# Patient Record
Sex: Male | Born: 1945
Health system: Southern US, Community
[De-identification: ages and names within clinical notes are randomized; demographics above are authoritative.]

## PROBLEM LIST (undated history)

## (undated) DIAGNOSIS — K219 Gastro-esophageal reflux disease without esophagitis: Secondary | ICD-10-CM

## (undated) DIAGNOSIS — E559 Vitamin D deficiency, unspecified: Secondary | ICD-10-CM

## (undated) DIAGNOSIS — I509 Heart failure, unspecified: Secondary | ICD-10-CM

## (undated) DIAGNOSIS — Z950 Presence of cardiac pacemaker: Secondary | ICD-10-CM

## (undated) DIAGNOSIS — E291 Testicular hypofunction: Secondary | ICD-10-CM

## (undated) DIAGNOSIS — N183 Chronic kidney disease, stage 3 unspecified: Secondary | ICD-10-CM

## (undated) DIAGNOSIS — I82409 Acute embolism and thrombosis of unspecified deep veins of unspecified lower extremity: Secondary | ICD-10-CM

## (undated) DIAGNOSIS — M1990 Unspecified osteoarthritis, unspecified site: Secondary | ICD-10-CM

## (undated) DIAGNOSIS — G4730 Sleep apnea, unspecified: Secondary | ICD-10-CM

## (undated) DIAGNOSIS — O2230 Deep phlebothrombosis in pregnancy, unspecified trimester: Secondary | ICD-10-CM

## (undated) DIAGNOSIS — I472 Ventricular tachycardia, unspecified: Secondary | ICD-10-CM

## (undated) DIAGNOSIS — I10 Essential (primary) hypertension: Secondary | ICD-10-CM

## (undated) DIAGNOSIS — T82110A Breakdown (mechanical) of cardiac electrode, initial encounter: Secondary | ICD-10-CM

## (undated) DIAGNOSIS — N529 Male erectile dysfunction, unspecified: Secondary | ICD-10-CM

## (undated) DIAGNOSIS — Z973 Presence of spectacles and contact lenses: Secondary | ICD-10-CM

## (undated) DIAGNOSIS — I428 Other cardiomyopathies: Secondary | ICD-10-CM

## (undated) DIAGNOSIS — I2510 Atherosclerotic heart disease of native coronary artery without angina pectoris: Secondary | ICD-10-CM

## (undated) DIAGNOSIS — I420 Dilated cardiomyopathy: Secondary | ICD-10-CM

## (undated) DIAGNOSIS — I499 Cardiac arrhythmia, unspecified: Secondary | ICD-10-CM

## (undated) DIAGNOSIS — I5022 Chronic systolic (congestive) heart failure: Secondary | ICD-10-CM

## (undated) DIAGNOSIS — I493 Ventricular premature depolarization: Secondary | ICD-10-CM

## (undated) DIAGNOSIS — Z974 Presence of external hearing-aid: Secondary | ICD-10-CM

## (undated) DIAGNOSIS — E119 Type 2 diabetes mellitus without complications: Secondary | ICD-10-CM

## (undated) DIAGNOSIS — H269 Unspecified cataract: Secondary | ICD-10-CM

## (undated) DIAGNOSIS — Z95810 Presence of automatic (implantable) cardiac defibrillator: Secondary | ICD-10-CM

## (undated) DIAGNOSIS — Z4502 Encounter for adjustment and management of automatic implantable cardiac defibrillator: Secondary | ICD-10-CM

## (undated) HISTORY — DX: Essential (primary) hypertension: I10

## (undated) HISTORY — DX: Ventricular tachycardia: I47.2

## (undated) HISTORY — DX: Male erectile dysfunction, unspecified: N52.9

## (undated) HISTORY — DX: Breakdown (mechanical) of cardiac electrode, initial encounter: T82.110A

## (undated) HISTORY — DX: Vitamin D deficiency, unspecified: E55.9

## (undated) HISTORY — DX: Testicular hypofunction: E29.1

## (undated) HISTORY — DX: Unspecified osteoarthritis, unspecified site: M19.90

## (undated) HISTORY — PX: VASECTOMY: SHX75

## (undated) HISTORY — DX: Dilated cardiomyopathy: I42.0

## (undated) HISTORY — DX: Unspecified cataract: H26.9

## (undated) HISTORY — PX: LUMBAR LAMINECTOMY: SHX95

## (undated) HISTORY — PX: CATARACT EXTRACTION W/ INTRAOCULAR LENS  IMPLANT, BILATERAL: SHX1307

## (undated) HISTORY — DX: Ventricular tachycardia, unspecified: I47.20

---

## 1967-06-18 HISTORY — PX: APPENDECTOMY: SHX54

## 1970-06-17 HISTORY — PX: TONSILLECTOMY AND ADENOIDECTOMY: SUR1326

## 1982-06-17 HISTORY — PX: KNEE ARTHROSCOPY: SUR90

## 2010-04-17 HISTORY — PX: CARDIAC CATHETERIZATION: SHX172

## 2010-05-09 HISTORY — PX: CARDIAC DEFIBRILLATOR PLACEMENT: SHX171

## 2011-09-14 ENCOUNTER — Ambulatory Visit: Payer: Self-pay | Admitting: Family Medicine

## 2011-09-14 ENCOUNTER — Inpatient Hospital Stay: Payer: Self-pay | Admitting: *Deleted

## 2011-09-14 LAB — CBC WITH DIFFERENTIAL/PLATELET
Basophil #: 0 10*3/uL (ref 0.0–0.1)
Basophil %: 0.1 %
Comment - H1-Com1: NORMAL
Comment - H1-Com2: NORMAL
Eosinophil #: 2.3 10*3/uL — ABNORMAL HIGH (ref 0.0–0.7)
Eosinophil %: 22.1 %
Eosinophil: 24 %
HCT: 52.5 % — ABNORMAL HIGH (ref 40.0–52.0)
HGB: 17.7 g/dL (ref 13.0–18.0)
Lymphocyte %: 17.7 %
MCH: 31.3 pg (ref 26.0–34.0)
Monocyte #: 0.1 10*3/uL (ref 0.0–0.7)
Monocytes: 2 %
Neutrophil #: 6.2 10*3/uL (ref 1.4–6.5)
Neutrophil %: 59.6 %
RDW: 14.5 % (ref 11.5–14.5)
Segmented Neutrophils: 46 %
WBC: 10.4 10*3/uL (ref 3.8–10.6)

## 2011-09-14 LAB — COMPREHENSIVE METABOLIC PANEL
Alkaline Phosphatase: 99 U/L (ref 50–136)
BUN: 20 mg/dL — ABNORMAL HIGH (ref 7–18)
Calcium, Total: 6.8 mg/dL — CL (ref 8.5–10.1)
Creatinine: 1.32 mg/dL — ABNORMAL HIGH (ref 0.60–1.30)
EGFR (African American): >60
Glucose: 151 mg/dL — ABNORMAL HIGH (ref 65–99)
Osmolality: 289 (ref 275–301)
Sodium: 142 mmol/L (ref 136–145)
Total Protein: 5.7 g/dL — ABNORMAL LOW (ref 6.4–8.2)

## 2011-09-15 LAB — COMPREHENSIVE METABOLIC PANEL
Anion Gap: 11 (ref 7–16)
BUN: 25 mg/dL — ABNORMAL HIGH (ref 7–18)
Bilirubin,Total: 0.4 mg/dL (ref 0.2–1.0)
Chloride: 107 mmol/L (ref 98–107)
Creatinine: 1.32 mg/dL — ABNORMAL HIGH (ref 0.60–1.30)
EGFR (African American): >60
Glucose: 231 mg/dL — ABNORMAL HIGH (ref 65–99)
Osmolality: 295 (ref 275–301)
Potassium: 3.2 mmol/L — ABNORMAL LOW (ref 3.5–5.1)
SGPT (ALT): 15 U/L
Total Protein: 5.2 g/dL — ABNORMAL LOW (ref 6.4–8.2)

## 2011-09-15 LAB — CBC WITH DIFFERENTIAL/PLATELET
Basophil #: 0 10*3/uL (ref 0.0–0.1)
Basophil %: 0.3 %
Eosinophil #: 0.4 10*3/uL (ref 0.0–0.7)
Eosinophil %: 3 %
Lymphocyte #: 1 10*3/uL (ref 1.0–3.6)
MCH: 31 pg (ref 26.0–34.0)
MCHC: 32.9 g/dL (ref 32.0–36.0)
MCV: 94 fL (ref 80–100)
Monocyte #: 0.2 10*3/uL (ref 0.0–0.7)
Monocyte %: 1.6 %
Neutrophil #: 11.2 10*3/uL — ABNORMAL HIGH (ref 1.4–6.5)
RBC: 4.55 10*6/uL (ref 4.40–5.90)
RDW: 14.5 % (ref 11.5–14.5)

## 2011-09-15 LAB — MAGNESIUM: Magnesium: 1 mg/dL — ABNORMAL LOW

## 2012-06-17 DIAGNOSIS — K760 Fatty (change of) liver, not elsewhere classified: Secondary | ICD-10-CM

## 2012-06-17 HISTORY — DX: Fatty (change of) liver, not elsewhere classified: K76.0

## 2012-08-26 ENCOUNTER — Ambulatory Visit: Payer: 59

## 2012-08-26 ENCOUNTER — Other Ambulatory Visit: Payer: Self-pay | Admitting: Radiology

## 2012-08-26 DIAGNOSIS — E291 Testicular hypofunction: Secondary | ICD-10-CM

## 2012-08-26 DIAGNOSIS — Z0000 Encounter for general adult medical examination without abnormal findings: Secondary | ICD-10-CM

## 2012-08-26 MED ORDER — ROSUVASTATIN CALCIUM 20 MG PO TABS: 20.0000 mg | ORAL_TABLET | Freq: Every day | ORAL | Status: DC

## 2012-08-26 MED ORDER — TAMSULOSIN HCL 0.4 MG PO CAPS: 0.4000 mg | ORAL_CAPSULE | Freq: Every day | ORAL | Status: DC

## 2012-08-26 MED ORDER — METFORMIN HCL ER (MOD) 500 MG PO TB24: 500.0000 mg | ORAL_TABLET | Freq: Four times a day (QID) | ORAL | Status: DC

## 2012-08-26 MED ORDER — LIRAGLUTIDE 18 MG/3ML ~~LOC~~ SOLN: 1.8000 mg | Freq: Every day | SUBCUTANEOUS | Status: DC

## 2012-08-26 MED ORDER — EPLERENONE 50 MG PO TABS: 50.0000 mg | ORAL_TABLET | Freq: Every day | ORAL | Status: DC

## 2012-08-26 MED ORDER — HYDROCHLOROTHIAZIDE 12.5 MG PO CAPS: 12.5000 mg | ORAL_CAPSULE | Freq: Every day | ORAL | Status: DC

## 2012-08-26 MED ORDER — CELECOXIB 200 MG PO CAPS: 200.0000 mg | ORAL_CAPSULE | Freq: Two times a day (BID) | ORAL | Status: DC

## 2012-08-26 MED ORDER — GLIMEPIRIDE 4 MG PO TABS: 8.0000 mg | ORAL_TABLET | Freq: Every day | ORAL | Status: DC

## 2012-08-26 MED ORDER — TESTOSTERONE 50 MG/5GM (1%) TD GEL: TRANSDERMAL | Status: DC

## 2012-08-26 MED ORDER — METOPROLOL SUCCINATE ER 200 MG PO TB24: 200.0000 mg | ORAL_TABLET | Freq: Every day | ORAL | Status: DC

## 2012-08-26 NOTE — Telephone Encounter (Signed)
, °

## 2012-08-27 ENCOUNTER — Other Ambulatory Visit: Payer: Self-pay | Admitting: Radiology

## 2012-08-27 NOTE — Telephone Encounter (Signed)
Called pharmacy to clarify Androgel Rx 1.62% four pumps daily

## 2012-10-01 ENCOUNTER — Encounter: Payer: Self-pay | Admitting: Radiology

## 2012-10-06 ENCOUNTER — Encounter: Payer: 59 | Admitting: Emergency Medicine

## 2012-10-16 ENCOUNTER — Other Ambulatory Visit (INDEPENDENT_AMBULATORY_CARE_PROVIDER_SITE_OTHER): Payer: 59 | Admitting: *Deleted

## 2012-10-16 DIAGNOSIS — E119 Type 2 diabetes mellitus without complications: Secondary | ICD-10-CM

## 2012-10-16 DIAGNOSIS — Z0000 Encounter for general adult medical examination without abnormal findings: Secondary | ICD-10-CM

## 2012-10-16 DIAGNOSIS — E291 Testicular hypofunction: Secondary | ICD-10-CM

## 2012-10-16 LAB — LIPID PANEL
Cholesterol: 175 mg/dL (ref 0–200)
HDL: 45 mg/dL (ref >39)
Total CHOL/HDL Ratio: 3.9 Ratio
Triglycerides: 159 mg/dL — ABNORMAL HIGH (ref <150)
VLDL: 32 mg/dL (ref 0–40)

## 2012-10-16 LAB — COMPREHENSIVE METABOLIC PANEL
BUN: 26 mg/dL — ABNORMAL HIGH (ref 6–23)
CO2: 29 mEq/L (ref 19–32)
Creat: 1.13 mg/dL (ref 0.50–1.35)
Glucose, Bld: 141 mg/dL — ABNORMAL HIGH (ref 70–99)
Total Bilirubin: 0.4 mg/dL (ref 0.3–1.2)
Total Protein: 6.6 g/dL (ref 6.0–8.3)

## 2012-10-16 LAB — CBC
HCT: 41.8 % (ref 39.0–52.0)
Platelets: 200 10*3/uL (ref 150–400)
RDW: 14 % (ref 11.5–15.5)
WBC: 8.2 10*3/uL (ref 4.0–10.5)

## 2012-10-16 LAB — TSH: TSH: 2.561 u[IU]/mL (ref 0.350–4.500)

## 2012-10-20 ENCOUNTER — Ambulatory Visit: Payer: 59

## 2012-10-20 ENCOUNTER — Encounter: Payer: Self-pay | Admitting: Emergency Medicine

## 2012-10-20 ENCOUNTER — Ambulatory Visit (INDEPENDENT_AMBULATORY_CARE_PROVIDER_SITE_OTHER): Payer: 59 | Admitting: Emergency Medicine

## 2012-10-20 VITALS — BP 110/80 | HR 81 | Temp 99.5°F | Resp 16 | Ht 70.5 in | Wt 238.6 lb

## 2012-10-20 DIAGNOSIS — I428 Other cardiomyopathies: Secondary | ICD-10-CM

## 2012-10-20 DIAGNOSIS — I472 Ventricular tachycardia: Secondary | ICD-10-CM | POA: Insufficient documentation

## 2012-10-20 DIAGNOSIS — E1165 Type 2 diabetes mellitus with hyperglycemia: Secondary | ICD-10-CM | POA: Insufficient documentation

## 2012-10-20 DIAGNOSIS — Z0000 Encounter for general adult medical examination without abnormal findings: Secondary | ICD-10-CM

## 2012-10-20 DIAGNOSIS — E119 Type 2 diabetes mellitus without complications: Secondary | ICD-10-CM

## 2012-10-20 DIAGNOSIS — M25471 Effusion, right ankle: Secondary | ICD-10-CM

## 2012-10-20 DIAGNOSIS — E291 Testicular hypofunction: Secondary | ICD-10-CM

## 2012-10-20 DIAGNOSIS — I495 Sick sinus syndrome: Secondary | ICD-10-CM

## 2012-10-20 DIAGNOSIS — I420 Dilated cardiomyopathy: Secondary | ICD-10-CM

## 2012-10-20 HISTORY — DX: Dilated cardiomyopathy: I42.0

## 2012-10-20 LAB — IFOBT (OCCULT BLOOD): IFOBT: NEGATIVE

## 2012-10-20 LAB — POCT URINALYSIS DIPSTICK
Glucose, UA: 100
Leukocytes, UA: NEGATIVE
Nitrite, UA: NEGATIVE

## 2012-10-20 LAB — POCT UA - MICROSCOPIC ONLY
WBC, Ur, HPF, POC: NEGATIVE
Yeast, UA: NEGATIVE

## 2012-10-20 MED ORDER — FUROSEMIDE 40 MG PO TABS: 40.0000 mg | ORAL_TABLET | Freq: Every day | ORAL | Status: DC

## 2012-10-20 NOTE — Progress Notes (Signed)
@[email or web address] @  Patient ID: Paul [name] MRN: [id number], DOB: Feb 07, 1946 67 y.o. Date of Encounter: 10/20/2012, 1:16 PM  Primary Physician: No primary provider on file.  Chief Complaint: Physical (CPE)  HPI: 67 y.o. y/o male with history noted below here for CPE.  Doing well. No issues/complaints.  Review of Systems:  Consitutional: No fever, chills, fatigue, night sweats, lymphadenopathy, or weight changes. Eyes: No visual changes, eye redness, or discharge. ENT/Mouth: Ears: No otalgia, hearing loss, discharge.Positive for tinnitus.  Nose: No congestion, rhinorrhea, sinus pain, or epistaxis. Throat: No sore throat,  or teeth pain.  Cardiovascular: History of nonischemic dilated cardiomyopathy. Implanted defibrillator. No CP, SOB, but does have swelling of both legs.    Respiratory: No cough, hemoptysis, SOB, or wheezing. Gastrointestinal: No anorexia, dysphagia, reflux, pain, nausea, vomiting, hematemesis, diarrhea, constipation, BRBPR, or melena. Genitourinary: No Frequency, urgency, hematuria, incontinence, nocturia, decreased urinary stream, discharge, impotence. Complaints of difficulty with urination and Scrotal swelling. Patient has been getting up 2-3 times each night to urinate. Musculoskeletal: No decreased ROM, myalgias, stiffness, joint swelling, or weakness. Complaints of knee pain. Skin: No rash, erythema, lesion changes, pain, warmth, jaundice, or pruritis. Neurological: No headache, dizziness, syncope, seizures, tremors, memory loss, coordination problems, or paresthesias. Positive Diplopia.  Psychological: No anxiety, depression, hallucinations, SI/HI. Endocrine: No fatigue, polydipsia, polyphagia, polyuria. Patient has diabetes Type 2 Complaints of hair loss. Glucose readings at home: 120-130 All other systems were reviewed and are otherwise negative.  Past Medical History  Diagnosis Date  . Cataract   . Diabetes mellitus without complication   . Hypertension   .  Arthritis   . Hypovitaminosis D   . Hypogonadism male   . Ventricular tachycardia   . ICD (implantable cardiac defibrillator) in place      Past Surgical History  Procedure Laterality Date  . Appendectomy    . Eye surgery    . Spine surgery    . Vasectomy    . Tonsillectomy and adenoidectomy  1972  . Knee arthroscopy  1984    Home Meds:  Prior to Admission medications   Medication Sig Start Date End Date Taking? Authorizing Provider  celecoxib (CELEBREX) 200 MG capsule Take 1 capsule (200 mg total) by mouth 2 (two) times daily. 08/26/12   Collene Gobble, MD  eplerenone (INSPRA) 50 MG tablet Take 1 tablet (50 mg total) by mouth daily. 08/26/12   Collene Gobble, MD  glimepiride (AMARYL) 4 MG tablet Take 2 tablets (8 mg total) by mouth daily before breakfast. 08/26/12   Collene Gobble, MD  hydrochlorothiazide (MICROZIDE) 12.5 MG capsule Take 1 capsule (12.5 mg total) by mouth daily. 08/26/12   Collene Gobble, MD  Liraglutide (VICTOZA) 18 MG/3ML SOLN injection Inject 0.3 mLs (1.8 mg total) into the skin daily. 08/26/12   Collene Gobble, MD  metFORMIN (GLUMETZA) 500 MG (MOD) 24 hr tablet Take 1 tablet (500 mg total) by mouth 4 (four) times daily. 08/26/12   Collene Gobble, MD  metoprolol (TOPROL-XL) 200 MG 24 hr tablet Take 1 tablet (200 mg total) by mouth daily. 08/26/12   Collene Gobble, MD  rosuvastatin (CRESTOR) 20 MG tablet Take 1 tablet (20 mg total) by mouth daily. 08/26/12   Collene Gobble, MD  tamsulosin (FLOMAX) 0.4 MG CAPS Take 1 capsule (0.4 mg total) by mouth daily. 08/26/12   Collene Gobble, MD  testosterone (ANDROGEL) 50 MG/5GM GEL Use four pumps daily 08/26/12   Collene Gobble, MD  Allergies:  Allergies  Allergen Reactions  . Lisinopril     angioedema    History   Social History  . Marital Status: Married    Spouse Name: N/A    Number of Children: N/A  . Years of Education: N/A   Occupational History  . Not on file.   Social History Main Topics  . Smoking status: Never  Smoker   . Smokeless tobacco: Never Used  . Alcohol Use: 1.2 oz/week    1 Glasses of wine, 1 Cans of beer per week     Comment: rarely  . Drug Use: No  . Sexually Active: Yes    Birth Control/ Protection: Post-menopausal   Other Topics Concern  . Not on file   Social History Narrative  . No narrative on file    Family History  Problem Relation Age of Onset  . Alcohol abuse Mother   . COPD Mother   . Depression Mother   . Heart disease Mother   . Hypertension Mother   . Stroke Mother   . COPD Father   . Heart disease Father   . Hypertension Father   . Stroke Father   . Cancer Father   . Diabetes Sister   . Mental retardation Sister   . Learning disabilities Sister   . Hypertension Sister   . Alcohol abuse Brother   . Drug abuse Brother   . Heart disease Brother   . Hypertension Brother   . Coronary artery disease Brother     Physical Exam:  Blood pressure 110/80, pulse 81, temperature 99.5 F (37.5 C), temperature source Oral, resp. rate 16, height 5' 10.5" (1.791 m), weight 238 lb 9.6 oz (108.228 kg), SpO2 98.00%.  General: Well developed, well nourished, in no acute distress. HEENT: Normocephalic, atraumatic. Conjunctiva pink, sclera non-icteric. Pupils 2 mm constricting to 1 mm, round, regular, and equally reactive to light and accomodation. EOMI. Internal auditory canal clear. TMs with good cone of light and without pathology. Nasal mucosa pink. Nares are without discharge. No sinus tenderness. Oral mucosa pink. Dentition . Pharynx without exudate.   Neck: Supple. Trachea midline. No thyromegaly. Full ROM. No lymphadenopathy. Lungs: Clear to auscultation bilaterally without wheezes, rales, or rhonchi. Breathing is of normal effort and unlabored. Cardiovascular: S1 S2 normal. 1/6 Systolic murmur left sternal boarder, very irregular rhythm.      Abdomen: Soft, non-tender, non-distended with normoactive bowel sounds. No hepatosplenomegaly or masses. No  rebound/guarding. No CVA tenderness. Without hernias.  Rectal: No external hemorrhoids or fissures. Rectal vault without masses Genitourinary: Musculoskeletal: Full range of motion and 5/5 strength throughout. Without swelling, atrophy, tenderness, crepitus, or warmth. Extremities without clubbing, cyanosis, or edema. Calves supple. Skin: Warm and moist without erythema, ecchymosis, wounds, or rash. Neuro: A+Ox3. CN II-XII grossly intact. Moves all extremities spontaneously. Full sensation throughout. Normal gait. DTR 2+ throughout upper and lower extremities. Finger to nose intact. Psych:  Responds to questions appropriately with a normal affect.  Extremity exam revealed 2 + bilateral edema  Results for orders placed in visit on 10/20/12  GLUCOSE, POCT (MANUAL RESULT ENTRY)      Result Value Range   POC Glucose 217 (*) 70 - 99 mg/dl  IFOBT (OCCULT BLOOD)      Result Value Range   IFOBT Negative    POCT UA - MICROSCOPIC ONLY      Result Value Range   WBC, Ur, HPF, POC negative     RBC, urine, microscopic negative  Bacteria, U Microscopic negative     Mucus, UA positive     Epithelial cells, urine per micros 1-3     Crystals, Ur, HPF, POC negative     Casts, Ur, LPF, POC negative     Yeast, UA negative    POCT URINALYSIS DIPSTICK      Result Value Range   Color, UA dark yellow     Clarity, UA clear     Glucose, UA 100     Bilirubin, UA small     Ketones, UA 15     Spec Grav, UA >=1.030     Blood, UA negative     pH, UA 5.0     Protein, UA 100     Urobilinogen, UA 1.0     Nitrite, UA negative     Leukocytes, UA Negative     [facility] reading (PRIMARY) by  Dr. Cleta Alberts defibrillator in place no evidence of CHF.   Studies:    Assessment/Plan:  67 y.o. White male with ischemic cardiomyopathy and implanted defibrillator. EKG reviewed by Dr. Antoine Poche and no acute changes seen. Multiple ectopic beats present. They are going to make an appointment at the [facility].  Appt made for doppler studies both legs. Will treat edema with Lasix 40 mg. 1/2 to 1 tab a day. Support hose suggested.       -  Signed, Earl Lites, MD 10/20/2012 1:16 PM

## 2012-10-28 ENCOUNTER — Telehealth: Payer: Self-pay | Admitting: Radiology

## 2012-10-28 NOTE — Telephone Encounter (Signed)
Rescheduled appt for [facility]. This is scheduled now for June 13th at 9:45. To you FYI. I rescheduled because Dr Linwood Dibbles wife is having surgery.

## 2012-10-31 ENCOUNTER — Encounter: Payer: Self-pay | Admitting: Emergency Medicine

## 2012-11-05 ENCOUNTER — Institutional Professional Consult (permissible substitution): Payer: Self-pay | Admitting: Internal Medicine

## 2012-11-11 ENCOUNTER — Encounter: Payer: 59 | Admitting: Emergency Medicine

## 2012-11-24 ENCOUNTER — Other Ambulatory Visit: Payer: Self-pay | Admitting: Emergency Medicine

## 2012-11-24 DIAGNOSIS — M25471 Effusion, right ankle: Secondary | ICD-10-CM

## 2012-11-24 MED ORDER — CELECOXIB 200 MG PO CAPS: 200.0000 mg | ORAL_CAPSULE | Freq: Two times a day (BID) | ORAL | Status: DC

## 2012-11-24 MED ORDER — ROSUVASTATIN CALCIUM 20 MG PO TABS: 20.0000 mg | ORAL_TABLET | Freq: Every day | ORAL | Status: DC

## 2012-11-24 MED ORDER — FUROSEMIDE 40 MG PO TABS: 40.0000 mg | ORAL_TABLET | Freq: Every day | ORAL | Status: DC

## 2012-11-24 MED ORDER — METFORMIN HCL ER (MOD) 500 MG PO TB24: 500.0000 mg | ORAL_TABLET | Freq: Four times a day (QID) | ORAL | Status: DC

## 2012-11-24 MED ORDER — HYDROCHLOROTHIAZIDE 12.5 MG PO CAPS: 12.5000 mg | ORAL_CAPSULE | Freq: Every day | ORAL | Status: DC

## 2012-11-24 MED ORDER — GLIMEPIRIDE 4 MG PO TABS: 8.0000 mg | ORAL_TABLET | Freq: Every day | ORAL | Status: DC

## 2012-11-27 ENCOUNTER — Ambulatory Visit (INDEPENDENT_AMBULATORY_CARE_PROVIDER_SITE_OTHER): Payer: 59 | Admitting: Internal Medicine

## 2012-11-27 ENCOUNTER — Encounter: Payer: Self-pay | Admitting: Internal Medicine

## 2012-11-27 ENCOUNTER — Ambulatory Visit (INDEPENDENT_AMBULATORY_CARE_PROVIDER_SITE_OTHER): Payer: 59 | Admitting: *Deleted

## 2012-11-27 VITALS — BP 128/90 | HR 71 | Ht 71.0 in | Wt 242.4 lb

## 2012-11-27 DIAGNOSIS — I472 Ventricular tachycardia, unspecified: Secondary | ICD-10-CM

## 2012-11-27 DIAGNOSIS — I428 Other cardiomyopathies: Secondary | ICD-10-CM

## 2012-11-27 DIAGNOSIS — I4891 Unspecified atrial fibrillation: Secondary | ICD-10-CM | POA: Insufficient documentation

## 2012-11-27 DIAGNOSIS — I420 Dilated cardiomyopathy: Secondary | ICD-10-CM

## 2012-11-27 DIAGNOSIS — R42 Dizziness and giddiness: Secondary | ICD-10-CM | POA: Insufficient documentation

## 2012-11-27 LAB — ICD DEVICE OBSERVATION
AL AMPLITUDE: 2.875 mv
AL IMPEDENCE ICD: 513 Ohm
ATRIAL PACING ICD: 37.88 pct
RV LEAD IMPEDENCE ICD: 361 Ohm
TOT-0001: 1
TOT-0002: 0
TOT-0006: 20111123000000
TZAT-0001ATACH: 2
TZAT-0001FASTVT: 1
TZAT-0002ATACH: NEGATIVE
TZAT-0002ATACH: NEGATIVE
TZAT-0002ATACH: NEGATIVE
TZAT-0002FASTVT: NEGATIVE
TZAT-0002SLOWVT: NEGATIVE
TZAT-0012ATACH: 150 ms
TZAT-0012FASTVT: 200 ms
TZAT-0018ATACH: NEGATIVE
TZAT-0018ATACH: NEGATIVE
TZAT-0019ATACH: 6 V
TZAT-0019ATACH: 6 V
TZAT-0019ATACH: 6 V
TZAT-0020ATACH: 1.5 ms
TZAT-0020SLOWVT: 1.5 ms
TZON-0004VSLOWVT: 44
TZON-0005SLOWVT: 12
TZON-0010FASTVT: 50 ms
TZON-0010SLOWVT: 50 ms
TZST-0001FASTVT: 4
TZST-0001FASTVT: 5
TZST-0001SLOWVT: 3
TZST-0001SLOWVT: 5
TZST-0002ATACH: NEGATIVE
TZST-0002FASTVT: NEGATIVE
TZST-0002FASTVT: NEGATIVE
TZST-0002SLOWVT: NEGATIVE
TZST-0002SLOWVT: NEGATIVE
TZST-0002SLOWVT: NEGATIVE
VF: 0

## 2012-11-27 NOTE — Assessment & Plan Note (Signed)
Detected on his device. Duration less than 10 minutes. As multiple thromboembolic risk factors and we had a brief discussion about the possible role of anticoagulation versus aspirin.  For now we will decrease his aspirin from 325--81

## 2012-11-27 NOTE — Patient Instructions (Addendum)
Your physician recommends that you schedule a follow-up appointment in:3 MONTHS WITH  DEVICE CLINIC  EITHER IN [place] OR   [facility] Your physician has requested that you have an echocardiogram. Echocardiography is a painless test that uses sound waves to create images of your heart. It provides your doctor with information about the size and shape of your heart and how well your heart's chambers and valves are working. This procedure takes approximately one hour. There are no restrictions for this procedure.  Your physician has recommended you make the following change in your medication:  DECREASE ASA TO 81 MG EVERY DAY DECREASE METOPROLOL TO 100 MG EVERY DAY  AND  TAKE  LASIX 40 MG  ALT WITH  80 MG EVERY OTHER DAY FOR 2 WEEKS

## 2012-11-27 NOTE — Assessment & Plan Note (Signed)
Patient has infrequent ventricular tachycardia but does have frequent PVCs. We will assess left ventricular function as noted above and continue him on his metoprolol

## 2012-11-27 NOTE — Progress Notes (Signed)
ELECTROPHYSIOLOGY CONSULT NOTE  Patient ID: Paul [name], MRN: [id number], DOB/AGE: [birth year] 67 y.o. Admit date: (Not on file) Date of Consult: 11/27/2012  Primary Physician: Lucilla Edin, MD Primary Cardiologist:new     Chief Complaint:  Establish    HPI Paul [name] is a 67 y.o. 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  At that time he been relatively healthy but [place] He was admitted and evaluated with a stress echo which was reported to show an ejection fraction of 45% w normalization at stress he also developed a wide-complex tachycardia thought to be ventricular in origin. He underwent catheterization 11/11 demonstrating nonobstructive disease  Rather diffusely.. Ejection fraction was recorded at 25-30%with normal filling pressures. He then underwent EP testing demonstrating sustained ventricular arrhythmias and an ICD was implanted. He's had no ICD discharges.  His functional capacity is relatively stable. He is not fit. He denies nocturnal dyspnea or orthopnea. He's had no chest pain.  He is recently suffered from lightheadedness-orthostatic with blood pressures recorded in the 90s. This prompted his PCP to rearrange his medications a little bit and that has helped some.  He enjoys scuba diving but has not done that.     Past Medical History  Diagnosis Date  . Cataract   . Diabetes mellitus without complication   . Hypertension   . Arthritis   . Hypovitaminosis D   . Hypogonadism male   . Ventricular tachycardia   . ICD (implantable cardiac defibrillator) in place       Surgical History:  Past Surgical History  Procedure Laterality Date  . Appendectomy    . Eye surgery    . Spine surgery    . Vasectomy    . Tonsillectomy and adenoidectomy  1972  . Knee arthroscopy  1984     Home Meds: Prior to Admission medications   Medication Sig Start Date End Date Taking?  Authorizing Provider  aspirin 325 MG tablet Take 325 mg by mouth daily.   Yes Historical Provider, MD  celecoxib (CELEBREX) 200 MG capsule Take 1 capsule (200 mg total) by mouth 2 (two) times daily. 11/24/12  Yes Collene Gobble, MD  furosemide (LASIX) 40 MG tablet Take 1 tablet (40 mg total) by mouth daily. 11/24/12  Yes Collene Gobble, MD  glimepiride (AMARYL) 4 MG tablet Take 2 tablets (8 mg total) by mouth daily before breakfast. 11/24/12  Yes Collene Gobble, MD  Liraglutide (VICTOZA) 18 MG/3ML SOLN injection Inject 0.3 mLs (1.8 mg total) into the skin daily. 08/26/12  Yes Collene Gobble, MD  metFORMIN (GLUMETZA) 500 MG (MOD) 24 hr tablet Take 1 tablet (500 mg total) by mouth 4 (four) times daily. 11/24/12  Yes Collene Gobble, MD  metoprolol (TOPROL-XL) 200 MG 24 hr tablet TAKE 1 TABLET BY MOUTH ONCE DAILY 11/24/12  Yes Collene Gobble, MD  rosuvastatin (CRESTOR) 20 MG tablet Take 1 tablet (20 mg total) by mouth daily. 11/24/12  Yes Collene Gobble, MD  tamsulosin (FLOMAX) 0.4 MG CAPS TAKE 1 CAPSULE BY MOUTH ONCE DAILY 11/24/12  Yes Collene Gobble, MD  testosterone (ANDROGEL) 50 MG/5GM GEL Use four pumps daily 08/26/12  Yes Collene Gobble, MD    Allergies:  Allergies  Allergen Reactions  . Lisinopril     angioedema    History   Social History  . Marital Status: Married    Spouse Name: N/A  Number of Children: N/A  . Years of Education: N/A   Occupational History  . Not on file.   Social History Main Topics  . Smoking status: Never Smoker   . Smokeless tobacco: Never Used  . Alcohol Use: 1.2 oz/week    1 Glasses of wine, 1 Cans of beer per week     Comment: rarely  . Drug Use: No  . Sexually Active: Yes    Birth Control/ Protection: Post-menopausal   Other Topics Concern  . Not on file   Social History Narrative  . No narrative on file     Family History  Problem Relation Age of Onset  . Alcohol abuse Mother   . COPD Mother   . Depression Mother   . Heart disease Mother   .  Hypertension Mother   . Stroke Mother   . COPD Father   . Heart disease Father   . Hypertension Father   . Stroke Father   . Cancer Father   . Diabetes Sister   . Mental retardation Sister   . Learning disabilities Sister   . Hypertension Sister   . Alcohol abuse Brother   . Drug abuse Brother   . Heart disease Brother   . Hypertension Brother   . Coronary artery disease Brother      ROS:  Please see the history of present illness.     All other systems reviewed and negative.    Physical Exam:   Blood pressure 128/90, pulse 71, height 5\' 11"  (1.803 m), weight 242 lb 6.4 oz (109.952 kg). General: Well developed, obese well nourished male in no acute distress. Head: Normocephalic, atraumatic, sclera non-icteric, no xanthomas, nares are without discharge. EENT: normal Lymph Nodes:  none Back: without scoliosis/kyphosis , no CVA tendersness Neck: Negative for carotid bruits. JVD not elevated. Lungs: Clear bilaterally to auscultation without wheezes, rales, or rhonchi. Breathing is unlabored. Heart: RRR with S1 S2.+S4 No murmur , rubs, or gallops appreciated. Abdomen: Soft, non-tender, non-distended with normoactive bowel sounds. No hepatomegaly. No rebound/guarding. No obvious abdominal masses. Msk:  Strength and tone appear normal for age. Extremities: No clubbing or cyanosis.2+ edema.  Distal pedal pulses are 2+ and equal bilaterally. Skin: Warm and Dry Neuro: Alert and oriented X 3. CN III-XII intact Grossly normal sensory and motor function . Psych:  Responds to questions appropriately with a normal affect.      Labs: Cardiac Enzymes No results found for this basename: CKTOTAL, CKMB, TROPONINI,  in the last 72 hours CBC Lab Results  Component Value Date   WBC 8.2 10/16/2012   HGB 14.5 10/16/2012   HCT 41.8 10/16/2012   MCV 90.9 10/16/2012   PLT 200 10/16/2012   PROTIME: No results found for this basename: LABPROT, INR,  in the last 72 hours Chemistry No results found for  this basename: NA, K, CL, CO2, BUN, CREATININE, CALCIUM, LABALBU, PROT, BILITOT, ALKPHOS, ALT, AST, GLUCOSE,  in the last 168 hours Lipids Lab Results  Component Value Date   CHOL 175 10/16/2012   HDL 45 10/16/2012   LDLCALC 98 10/16/2012   TRIG 159* 10/16/2012   BNP No results found for this basename: probnp   Miscellaneous No results found for this basename: DDIMER    Radiology/Studies:  No results found.  EKG:  ECG demonstrates sinus rhythm with PACs. Rate was about 70. Interval 17/12/43 Axis is leftward at -84 PVCsrepeatedly at about 30% with a right bundle inferior axis morphology and monophasic R-wave in lead  1 and L.   Assessment and Plan:    Sherryl Manges

## 2012-11-27 NOTE — Assessment & Plan Note (Signed)
The patient has a cardiomyopathy it is determined significance. There were discordant data at the time of his initial valuation with a catheterization showing 25-30% an echo showing normal LV function. Repeat echo in 2012 had mild LV dysfunction. He has frequent PVCs I wonder that he may be contributing to his cardiomyopathy. It occurred burden estimated from his defibrillator is about 12-14%.  We will reassess left ventricular function. This would inform a decision both regarding ARB therapy as he had angioedema ACE inhibitors but also whether something more definitively needs to be done about his PVCs.

## 2012-11-27 NOTE — Progress Notes (Signed)
icd check in clinic  

## 2012-11-27 NOTE — Assessment & Plan Note (Signed)
Quite symptomatic. This is improved. We will work on increasing his diuresis and decrease his Toprol to minimize effect on blood pressure

## 2012-11-30 ENCOUNTER — Other Ambulatory Visit: Payer: Self-pay | Admitting: Emergency Medicine

## 2012-11-30 DIAGNOSIS — I999 Unspecified disorder of circulatory system: Secondary | ICD-10-CM

## 2012-12-02 ENCOUNTER — Encounter: Payer: Self-pay | Admitting: Internal Medicine

## 2012-12-04 ENCOUNTER — Other Ambulatory Visit (HOSPITAL_COMMUNITY): Payer: 59

## 2012-12-08 ENCOUNTER — Ambulatory Visit
Admission: RE | Admit: 2012-12-08 | Discharge: 2012-12-08 | Disposition: A | Discharge status: Home / Self Care | Payer: 59 | Source: Ambulatory Visit | Attending: Emergency Medicine | Admitting: Emergency Medicine

## 2012-12-08 DIAGNOSIS — I999 Unspecified disorder of circulatory system: Secondary | ICD-10-CM

## 2012-12-09 ENCOUNTER — Ambulatory Visit (HOSPITAL_COMMUNITY): Payer: Medicare Other | Attending: Cardiovascular Disease | Admitting: Radiology

## 2012-12-09 DIAGNOSIS — I472 Ventricular tachycardia, unspecified: Secondary | ICD-10-CM | POA: Insufficient documentation

## 2012-12-09 DIAGNOSIS — I420 Dilated cardiomyopathy: Secondary | ICD-10-CM

## 2012-12-09 DIAGNOSIS — R42 Dizziness and giddiness: Secondary | ICD-10-CM | POA: Diagnosis not present

## 2012-12-09 DIAGNOSIS — I4891 Unspecified atrial fibrillation: Secondary | ICD-10-CM | POA: Insufficient documentation

## 2012-12-09 DIAGNOSIS — I10 Essential (primary) hypertension: Secondary | ICD-10-CM | POA: Insufficient documentation

## 2012-12-09 DIAGNOSIS — I059 Rheumatic mitral valve disease, unspecified: Secondary | ICD-10-CM | POA: Insufficient documentation

## 2012-12-09 DIAGNOSIS — E119 Type 2 diabetes mellitus without complications: Secondary | ICD-10-CM | POA: Insufficient documentation

## 2012-12-09 DIAGNOSIS — I079 Rheumatic tricuspid valve disease, unspecified: Secondary | ICD-10-CM | POA: Diagnosis not present

## 2012-12-09 DIAGNOSIS — I379 Nonrheumatic pulmonary valve disorder, unspecified: Secondary | ICD-10-CM | POA: Diagnosis not present

## 2012-12-09 DIAGNOSIS — I428 Other cardiomyopathies: Secondary | ICD-10-CM | POA: Diagnosis present

## 2012-12-09 DIAGNOSIS — I4729 Other ventricular tachycardia: Secondary | ICD-10-CM | POA: Insufficient documentation

## 2012-12-09 NOTE — Progress Notes (Signed)
Echocardiogram performed.  

## 2012-12-29 ENCOUNTER — Telehealth: Payer: Self-pay | Admitting: Internal Medicine

## 2012-12-29 MED ORDER — LOSARTAN POTASSIUM 25 MG PO TABS: 25.0000 mg | ORAL_TABLET | Freq: Every day | ORAL | Status: DC

## 2012-12-29 NOTE — Telephone Encounter (Signed)
Follow Up ° ° °Pt calling to follow up on ECHO results. Please call. °

## 2012-12-29 NOTE — Telephone Encounter (Signed)
Called patient with echo results and Dr.Klein's recommendation of starting Cozaar 25mg  every day. He has a follow up appointment in September with Dr.Klein.

## 2013-02-25 ENCOUNTER — Telehealth: Payer: Self-pay | Admitting: Radiology

## 2013-02-25 MED ORDER — TAMSULOSIN HCL 0.4 MG PO CAPS: ORAL_CAPSULE | ORAL | Status: DC

## 2013-02-25 MED ORDER — LIRAGLUTIDE 18 MG/3ML ~~LOC~~ SOPN: 0.3000 mL | PEN_INJECTOR | Freq: Every day | SUBCUTANEOUS | Status: DC

## 2013-02-25 NOTE — Telephone Encounter (Signed)
Victoza and Flomax changed to 90 day supply with refills/ were given as 30 day with refills.

## 2013-03-04 ENCOUNTER — Encounter: Payer: Self-pay | Admitting: *Deleted

## 2013-03-09 ENCOUNTER — Encounter: Payer: Self-pay | Admitting: Internal Medicine

## 2013-03-09 ENCOUNTER — Ambulatory Visit (INDEPENDENT_AMBULATORY_CARE_PROVIDER_SITE_OTHER): Payer: 59 | Admitting: Internal Medicine

## 2013-03-09 VITALS — BP 115/70 | HR 77 | Ht 71.0 in | Wt 248.0 lb

## 2013-03-09 DIAGNOSIS — Z95810 Presence of automatic (implantable) cardiac defibrillator: Secondary | ICD-10-CM

## 2013-03-09 DIAGNOSIS — I4891 Unspecified atrial fibrillation: Secondary | ICD-10-CM

## 2013-03-09 DIAGNOSIS — I472 Ventricular tachycardia: Secondary | ICD-10-CM

## 2013-03-09 LAB — ICD DEVICE OBSERVATION
AL AMPLITUDE: 3 mv
AL IMPEDENCE ICD: 475 Ohm
ATRIAL PACING ICD: 51.34 pct
CHARGE TIME: 9.599 s
DEV-0020ICD: NEGATIVE
RV LEAD IMPEDENCE ICD: 361 Ohm
RV LEAD THRESHOLD: 0.75 V
TOT-0001: 1
TOT-0002: 0
TOT-0006: 20111123000000
TZAT-0001ATACH: 2
TZAT-0001FASTVT: 1
TZAT-0002ATACH: NEGATIVE
TZAT-0002ATACH: NEGATIVE
TZAT-0018ATACH: NEGATIVE
TZAT-0018ATACH: NEGATIVE
TZAT-0019ATACH: 6 V
TZAT-0019ATACH: 6 V
TZAT-0019ATACH: 6 V
TZAT-0019SLOWVT: 8 V
TZAT-0020ATACH: 1.5 ms
TZAT-0020FASTVT: 1.5 ms
TZAT-0020SLOWVT: 1.5 ms
TZON-0003SLOWVT: 360 ms
TZON-0003VSLOWVT: 400 ms
TZON-0004VSLOWVT: 44
TZON-0005SLOWVT: 12
TZON-0010FASTVT: 50 ms
TZON-0010SLOWVT: 50 ms
TZST-0001ATACH: 5
TZST-0001FASTVT: 3
TZST-0001FASTVT: 4
TZST-0001FASTVT: 5
TZST-0001SLOWVT: 3
TZST-0002ATACH: NEGATIVE
TZST-0002FASTVT: NEGATIVE
TZST-0002SLOWVT: NEGATIVE
TZST-0002SLOWVT: NEGATIVE
VENTRICULAR PACING ICD: 0.34 pct
VF: 0

## 2013-03-09 MED ORDER — LOSARTAN POTASSIUM 25 MG PO TABS: 50.0000 mg | ORAL_TABLET | Freq: Every day | ORAL | Status: DC

## 2013-03-09 NOTE — Assessment & Plan Note (Signed)
No significant atrial fibrillation at this point left atrial dimension is large

## 2013-03-09 NOTE — Assessment & Plan Note (Signed)
The patient's device was interrogated and the information was fully reviewed.  The device was not reprogrammed.  

## 2013-03-09 NOTE — Progress Notes (Signed)
Patient Care Team: Collene Gobble, MD as PCP - General (Family Medicine) Collene Gobble, MD (Family Medicine)   HPI  Paul [name] is a 67 y.o. male Seen in followup for ICD implanted November 2001. He had a history of presyncope and left ventricular dysfunctionEjection fraction was noted to be about 20-30%. EP testing demonstrated sustained ventricular arrhythmias and he underwent ICD implantation   he had frequent PVCs. PVCs were of a right bundle superior axis morphology  Ejection fraction had improved somewhat to 35%. ARB therapy was initiated.  He has complaints of dyspnea on exertion as well as some peripheral edema. There is no change in his medications vis--vis his Celebrex. Increasing his Lasix had an impact on his edema but no chronic shortness of breath.There is no coughing chest pain.  Past Medical History  Diagnosis Date  . Cataract   . Diabetes mellitus without complication   . Hypertension   . Arthritis   . Hypovitaminosis D   . Hypogonadism male   . Ventricular tachycardia   . ICD (implantable cardiac defibrillator) in place   . Ventricular tachycardia/frequent PVCs 10/20/2012    Patient has a implanted defibrillator Nonsustained VT was inducible sustained VT     Past Surgical History  Procedure Laterality Date  . Appendectomy    . Eye surgery    . Spine surgery    . Vasectomy    . Tonsillectomy and adenoidectomy  1972  . Knee arthroscopy  1984    Current Outpatient Prescriptions  Medication Sig Dispense Refill  . aspirin 81 MG tablet Take 81 mg by mouth daily.      . celecoxib (CELEBREX) 200 MG capsule Take 1 capsule (200 mg total) by mouth 2 (two) times daily.  180 capsule  1  . furosemide (LASIX) 40 MG tablet Take 1 tablet (40 mg total) by mouth daily.  90 tablet  1  . glimepiride (AMARYL) 4 MG tablet Take 2 tablets (8 mg total) by mouth daily before breakfast.  120 tablet  1  . Liraglutide (VICTOZA) 18 MG/3ML SOLN injection Inject 0.3 mLs (1.8 mg total)  into the skin daily.  6 mg  2  . Liraglutide (VICTOZA) 18 MG/3ML SOPN Inject 0.3 mLs into the skin daily.  9 pen  2  . losartan (COZAAR) 25 MG tablet Take 1 tablet (25 mg total) by mouth daily.  30 tablet  6  . metFORMIN (GLUMETZA) 500 MG (MOD) 24 hr tablet 4 TABS PO QD      . metoprolol (TOPROL-XL) 200 MG 24 hr tablet Take 100 mg by mouth daily.       . rosuvastatin (CRESTOR) 20 MG tablet Take 1 tablet (20 mg total) by mouth daily.  90 tablet  1  . tamsulosin (FLOMAX) 0.4 MG CAPS capsule TAKE 1 CAPSULE BY MOUTH ONCE DAILY  90 capsule  2  . testosterone (ANDROGEL) 50 MG/5GM GEL Use four pumps daily  4 Tube  2   No current facility-administered medications for this visit.    Allergies  Allergen Reactions  . Lisinopril     angioedema    Review of Systems negative except from HPI and PMH  Physical Exam BP 115/70  Pulse 77  Ht 5\' 11"  (1.803 m)  Wt 248 lb (112.492 kg)  BMI 34.6 kg/m2 Well developed and well nourished in no acute distress HENT normal E scleral and icterus clear Neck Supple JVP 8 carotids brisk and full Clear to ausculation Irregularly irregular rate and rhythm, no  murmurs gallops or rub Soft with active bowel sounds No clubbing cyanosis 1+ Edema Alert and oriented, grossly normal motor and sensory function Skin Warm and Dry  ECG demonstrated normal sinus rhythm with right bundle branch block. He also had frequent PVCs of 2 dominant morphologies or similar but not identical or were negative aVR in the inferior leads positive and aVL and the lateral leads  Assessment and  Plan

## 2013-03-09 NOTE — Assessment & Plan Note (Signed)
We will increase his Cozaar. He did not have a metabolic profile after its initiation and we'll plan to do this in about 2 weeks. He will continue on his diuretics. He is on beta blockers. Following that, we can consider the addition of Aldactone

## 2013-03-09 NOTE — Patient Instructions (Addendum)
Remote monitoring is used to monitor your Pacemaker of ICD from home. This monitoring reduces the number of office visits required to check your device to one time per year. It allows [place] to keep an eye on the functioning of your device to ensure it is working properly. You are scheduled for a device check from home on 06/14/13. You may send your transmission at any time that day. If you have a wireless device, the transmission will be sent automatically. After your physician reviews your transmission, you will receive a postcard with your next transmission date.  Your physician wants you to follow-up in: ONE YEAR WITH DR Graciela Husbands. You will receive a reminder letter in the mail two months in advance. If you don't receive a letter, please call our office to schedule the follow-up appointment.   You have been referred to DR [facility] FOR PVC ABLATION  Your physician has recommended you make the following change in your medication:  1) INCREASE COZAAR TO 50 MG DAILY  Your physician recommends that you return for lab work in: 2 WEEKS FOR BMET

## 2013-03-09 NOTE — Assessment & Plan Note (Addendum)
By palpation the patient has frequent he is greater than 25%.  ICD interrogation demonstrates approximately 25-30% PVCs over the last 3 months.  It is also noteworthy that there are subtle variations in the 2 dominant PVC morphologies. I suspect that this is an exit site issue as opposed to a focus issue. I look forward to Dr. Fawn Kirk input regarding potential value of catheter ablation\    Given that burden and his ongoing problems with cardiomyopathy, the the consideration goes to catheter ablation of his PVCs. Predictors of recovery are  relatively narrow QRS.

## 2013-03-19 ENCOUNTER — Encounter: Payer: Self-pay | Admitting: Internal Medicine

## 2013-03-26 ENCOUNTER — Other Ambulatory Visit: Payer: Self-pay | Admitting: *Deleted

## 2013-03-26 DIAGNOSIS — I4891 Unspecified atrial fibrillation: Secondary | ICD-10-CM

## 2013-04-11 ENCOUNTER — Other Ambulatory Visit: Payer: Self-pay | Admitting: Radiology

## 2013-04-11 MED ORDER — LOSARTAN POTASSIUM 50 MG PO TABS: 50.0000 mg | ORAL_TABLET | Freq: Every day | ORAL | Status: DC

## 2013-04-12 ENCOUNTER — Institutional Professional Consult (permissible substitution): Payer: 59 | Admitting: Internal Medicine

## 2013-05-17 ENCOUNTER — Telehealth: Payer: Self-pay | Admitting: Internal Medicine

## 2013-05-17 ENCOUNTER — Encounter: Payer: Self-pay | Admitting: Internal Medicine

## 2013-05-17 ENCOUNTER — Ambulatory Visit (INDEPENDENT_AMBULATORY_CARE_PROVIDER_SITE_OTHER): Payer: 59 | Admitting: Internal Medicine

## 2013-05-17 VITALS — BP 154/81 | HR 64 | Ht 71.0 in | Wt 248.4 lb

## 2013-05-17 DIAGNOSIS — I10 Essential (primary) hypertension: Secondary | ICD-10-CM | POA: Insufficient documentation

## 2013-05-17 DIAGNOSIS — I493 Ventricular premature depolarization: Secondary | ICD-10-CM

## 2013-05-17 DIAGNOSIS — I472 Ventricular tachycardia, unspecified: Secondary | ICD-10-CM

## 2013-05-17 DIAGNOSIS — I420 Dilated cardiomyopathy: Secondary | ICD-10-CM

## 2013-05-17 DIAGNOSIS — I4949 Other premature depolarization: Secondary | ICD-10-CM

## 2013-05-17 DIAGNOSIS — Z95810 Presence of automatic (implantable) cardiac defibrillator: Secondary | ICD-10-CM

## 2013-05-17 DIAGNOSIS — I428 Other cardiomyopathies: Secondary | ICD-10-CM

## 2013-05-17 NOTE — Progress Notes (Signed)
Primary Care Physician: DAUB, STEVE A, MD Referring Physician:  Dr Klein   Paul [name] is a 67 y.o. 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  He recently moved to our area from [place] and has established EP care with Dr Klein.  He is note to have very frequent PVCs.  Per Dr Klein, his device interrogation revealed 25-30% PVC burden over a 3 month period. The patient reports exertional SOB (DOE with 1 flight of stairs).  He also reports worsening BLE edema.  He has frequent palpitations with his PVCs. Today, he denies symptoms of chest pain, orthopnea, PND, dizziness, presyncope, syncope, or prior ICD therapy.  The patient is tolerating medications without difficulties and is otherwise without complaint today.   Past Medical History  Diagnosis Date  . Cataract   . Diabetes mellitus without complication   . Hypertension   . Arthritis   . Hypovitaminosis D   . Hypogonadism male   . Ventricular tachycardia     observed during stress testing, subsequent VT on EPS  . ICD (implantable cardiac defibrillator) in place     MDT implanted in 2011 in [place]  . Ventricular tachycardia/frequent PVCs 10/20/2012    Patient has a implanted defibrillator Nonsustained VT was inducible sustained VT   . 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 Erectile dysfunction    Past Surgical History  Procedure Laterality Date  . Appendectomy    . Eye surgery    . Spine surgery    . Vasectomy    . Tonsillectomy and adenoidectomy  1972  . Knee arthroscopy  1984  . Cardiac defibrillator placement  05/17/2010    MDT ICD implanted in [place] by Dr Ard    Current Outpatient Prescriptions  Medication Sig Dispense Refill  . aspirin 81 MG tablet Take 81 mg by mouth daily.      . celecoxib (CELEBREX) 200 MG capsule Take 1 capsule (200 mg total) by mouth 2 (two) times daily.  180 capsule  1  . furosemide (LASIX)  40 MG tablet Take 1 tablet (40 mg total) by mouth daily.  90 tablet  1  . glimepiride (AMARYL) 4 MG tablet Take 2 tablets (8 mg total) by mouth daily before breakfast.  120 tablet  1  . Liraglutide (VICTOZA) 18 MG/3ML SOPN Inject 0.3 mLs into the skin daily.  9 pen  2  . losartan (COZAAR) 50 MG tablet Take 1 tablet (50 mg total) by mouth daily.  90 tablet  3  . metFORMIN (GLUMETZA) 500 MG (MOD) 24 hr tablet Take 2,000 mg by mouth daily.       . metoprolol (TOPROL-XL) 200 MG 24 hr tablet Take 100 mg by mouth daily.       . tamsulosin (FLOMAX) 0.4 MG CAPS capsule TAKE 1 CAPSULE BY MOUTH ONCE DAILY  90 capsule  2  . testosterone (ANDROGEL) 50 MG/5GM GEL Use four pumps daily  4 Tube  2   No current facility-administered medications for this visit.    Allergies  Allergen Reactions  . Lisinopril     angioedema    History   Social History  . Marital Status: Married    Spouse Name: N/A    Number of Children: N/A  . Years of Education: N/A   Occupational History  . Not on file.   Social History Main Topics  . Smoking status: Never Smoker   .   Smokeless tobacco: Never Used  . Alcohol Use: 1.2 oz/week    1 Glasses of wine, 1 Cans of beer per week     Comment: rarely  . Drug Use: No  . Sexual Activity: Yes    Birth Control/ Protection: Post-menopausal   Other Topics Concern  . 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    Moved here from [place] 1.5 years ago    Family History  Problem Relation Age of Onset  . Alcohol abuse Mother   . COPD Mother   . Depression Mother   . Heart disease Mother   . Hypertension Mother   . Stroke Mother   . COPD Father   . Heart disease Father   . Hypertension Father   . Stroke Father   . Cancer Father   . Diabetes Sister   . Mental retardation Sister   . Learning disabilities Sister   . Hypertension Sister   . Alcohol abuse Brother   .  Drug abuse Brother   . Heart disease Brother   . Hypertension Brother   . Coronary artery disease Brother     ROS- All systems are reviewed and negative except as per the HPI above  Physical Exam: Filed Vitals:   05/17/13 1119  BP: 154/81  Pulse: 64  Height: 5' 11" (1.803 m)  Weight: 248 lb 6.4 oz (112.674 kg)    GEN- The patient is well appearing, alert and oriented x 3 today.   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  I suspect that these likely arise form the inferior LV.  Review of prior ekgs   Assessment and Plan:  1. 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  He has failed medical therapy with metoprolol.  He is a poor candidate for AAD therapy.  I would agree with Dr Klein that he is a good candidate for ablation. Therapeutic strategies for ventricular tachycardia/ PVCs including medicine and ablation were discussed in detail with the patient today. Risk, benefits, and alternatives to EP study and radiofrequency ablation were also discussed in detail today. These risks include but are not limited to stroke, bleeding, vascular damage, tamponade, perforation, damage to the heart and other structures, AV block requiring pacemaker, worsening renal function, ICD lead dislodgement and death. The patient understands  these risk and wishes to proceed.  We will therefore proceed with catheter ablation with Carto at the next available time.  2. Nonischemic CM/ chronic systolic dysfunction Hopefully symptoms and EF may improve with ablation  3. HTN Stable No change required today  

## 2013-05-17 NOTE — Patient Instructions (Signed)
Your physician has recommended that you have an ablation. Catheter ablation is a medical procedure used to treat some cardiac arrhythmias (irregular heartbeats). During catheter ablation, a long, thin, flexible tube is put into a blood vessel in your groin (upper thigh), or neck. This tube is called an ablation catheter. It is then guided to your heart through the blood vessel. Radio frequency waves destroy small areas of heart tissue where abnormal heartbeats may cause an arrhythmia to start. Please see the instruction sheet given to you today.  Call Anselm Pancoast when ready to schedule PVC ablation Dates:  12/12,12/16,12/30,01/02,01/06,01/13,01/16,01/19,01/20,01/27,01/30    Your physician wants you to follow-up in: 11/2013 with Dr Logan Bores will receive a reminder letter in the mail two months in advance. If you don't receive a letter, please call our office to schedule the follow-up appointment.  Remote monitoring is used to monitor your Pacemaker of ICD from home. This monitoring reduces the number of office visits required to check your device to one time per year. It allows [place] to keep an eye on the functioning of your device to ensure it is working properly. You are scheduled for a device check from home on 06/14/13. You may send your transmission at any time that day. If you have a wireless device, the transmission will be sent automatically. After your physician reviews your transmission, you will receive a postcard with your next transmission date.

## 2013-05-17 NOTE — Telephone Encounter (Signed)
New problem   He can do ablation on 06/01/13. Please advise

## 2013-05-18 NOTE — Telephone Encounter (Signed)
Left him a message that I have his procedure scheduled for 12/16 at 12  Need to be at the hospital at 10am

## 2013-05-19 ENCOUNTER — Other Ambulatory Visit: Payer: Self-pay | Admitting: Emergency Medicine

## 2013-05-20 ENCOUNTER — Telehealth: Payer: Self-pay | Admitting: Radiology

## 2013-05-20 NOTE — Telephone Encounter (Signed)
Form signed and placed in outgoing fax box.

## 2013-05-20 NOTE — Telephone Encounter (Signed)
I have completed a prior authorization for patients Celebrex and it is in the box for signature, please sign so I can fax.

## 2013-05-24 NOTE — Telephone Encounter (Signed)
Left message for patient that his procedure is scheduled for 12/16 at 12  Needs to be at the hospital at 10.  NPO after midnight and hold Metoprolol starting 05/25/13.  Will have his labs done at the hospital.

## 2013-05-24 NOTE — Telephone Encounter (Signed)
Left another message for him to call me back tomorrow around 8am to discuss procedure

## 2013-05-25 ENCOUNTER — Encounter (HOSPITAL_COMMUNITY): Payer: Self-pay | Admitting: Respiratory Therapy

## 2013-05-25 NOTE — Telephone Encounter (Signed)
I called the patient again and got the same voicemail.  I then called his emergency contact his wife Onur Mori and spoke with her.  I gave her all his instructions including holding Metoprolol for 48 hours prior to procedure  His last dose will be 05/29/13 not 06/04/13.  She verbalized understanding and will have him call me with any questions.  He is to be at the hospital at 10am on 06/01/13 and nothing to eat or drink after midnight

## 2013-05-28 ENCOUNTER — Other Ambulatory Visit: Payer: Self-pay | Admitting: *Deleted

## 2013-05-28 NOTE — Telephone Encounter (Signed)
Will obtain labs at the hospital

## 2013-06-01 ENCOUNTER — Encounter (HOSPITAL_COMMUNITY): Payer: 59 | Admitting: Certified Registered"

## 2013-06-01 ENCOUNTER — Encounter (HOSPITAL_COMMUNITY): Payer: Self-pay | Admitting: Certified Registered"

## 2013-06-01 ENCOUNTER — Encounter (HOSPITAL_COMMUNITY)
Admission: RE | Disposition: A | Discharge status: Home / Self Care | Payer: Self-pay | Source: Ambulatory Visit | Attending: Internal Medicine

## 2013-06-01 ENCOUNTER — Ambulatory Visit (HOSPITAL_COMMUNITY): Payer: 59 | Admitting: Certified Registered"

## 2013-06-01 ENCOUNTER — Ambulatory Visit (HOSPITAL_COMMUNITY)
Admission: RE | Admit: 2013-06-01 | Discharge: 2013-06-02 | Disposition: A | Discharge status: Home / Self Care | Payer: 59 | Source: Ambulatory Visit | Attending: Internal Medicine | Admitting: Internal Medicine

## 2013-06-01 DIAGNOSIS — I472 Ventricular tachycardia, unspecified: Secondary | ICD-10-CM

## 2013-06-01 DIAGNOSIS — R55 Syncope and collapse: Secondary | ICD-10-CM | POA: Insufficient documentation

## 2013-06-01 DIAGNOSIS — I4891 Unspecified atrial fibrillation: Secondary | ICD-10-CM | POA: Insufficient documentation

## 2013-06-01 DIAGNOSIS — I493 Ventricular premature depolarization: Secondary | ICD-10-CM | POA: Diagnosis present

## 2013-06-01 DIAGNOSIS — E119 Type 2 diabetes mellitus without complications: Secondary | ICD-10-CM | POA: Insufficient documentation

## 2013-06-01 DIAGNOSIS — I4729 Other ventricular tachycardia: Secondary | ICD-10-CM | POA: Insufficient documentation

## 2013-06-01 DIAGNOSIS — I10 Essential (primary) hypertension: Secondary | ICD-10-CM | POA: Insufficient documentation

## 2013-06-01 DIAGNOSIS — I420 Dilated cardiomyopathy: Secondary | ICD-10-CM | POA: Diagnosis present

## 2013-06-01 DIAGNOSIS — I428 Other cardiomyopathies: Secondary | ICD-10-CM | POA: Insufficient documentation

## 2013-06-01 HISTORY — DX: Gastro-esophageal reflux disease without esophagitis: K21.9

## 2013-06-01 HISTORY — PX: ABLATION: SHX5711

## 2013-06-01 HISTORY — DX: Heart failure, unspecified: I50.9

## 2013-06-01 HISTORY — PX: VENTRICULAR ABLATION SURGERY: SHX835

## 2013-06-01 HISTORY — DX: Type 2 diabetes mellitus without complications: E11.9

## 2013-06-01 HISTORY — PX: V-TACH ABLATION: SHX5498

## 2013-06-01 LAB — GLUCOSE, CAPILLARY
Glucose-Capillary: 155 mg/dL — ABNORMAL HIGH (ref 70–99)
Glucose-Capillary: 122 mg/dL — ABNORMAL HIGH (ref 70–99)
Glucose-Capillary: 177 mg/dL — ABNORMAL HIGH (ref 70–99)
Glucose-Capillary: 169 mg/dL — ABNORMAL HIGH (ref 70–99)

## 2013-06-01 LAB — CBC
HCT: 38.9 % — ABNORMAL LOW (ref 39.0–52.0)
Hemoglobin: 13 g/dL (ref 13.0–17.0)
MCH: 31 pg (ref 26.0–34.0)
Platelets: 190 10*3/uL (ref 150–400)
RBC: 4.2 MIL/uL — ABNORMAL LOW (ref 4.22–5.81)
RDW: 14 % (ref 11.5–15.5)
WBC: 6.1 10*3/uL (ref 4.0–10.5)

## 2013-06-01 LAB — BASIC METABOLIC PANEL
BUN: 20 mg/dL (ref 6–23)
CO2: 28 mEq/L (ref 19–32)
Calcium: 8.6 mg/dL (ref 8.4–10.5)
Chloride: 105 mEq/L (ref 96–112)
GFR calc Af Amer: >90 mL/min (ref >90)
Potassium: 3.8 mEq/L (ref 3.5–5.1)

## 2013-06-01 SURGERY — V-TACH ABLATION
Anesthesia: General

## 2013-06-01 MED ORDER — LIDOCAINE HCL (CARDIAC) 20 MG/ML IV SOLN
INTRAVENOUS | Status: DC | PRN
  Administered 2013-06-01: 80 mg via INTRAVENOUS

## 2013-06-01 MED ORDER — BUPIVACAINE HCL (PF) 0.25 % IJ SOLN
INTRAMUSCULAR | Status: AC
  Filled 2013-06-01: qty 30

## 2013-06-01 MED ORDER — ASPIRIN 81 MG PO TABS: 81.0000 mg | ORAL_TABLET | Freq: Every day | ORAL | Status: DC

## 2013-06-01 MED ORDER — METOPROLOL SUCCINATE ER 50 MG PO TB24
50.0000 mg | ORAL_TABLET | Freq: Every day | ORAL | Status: DC
  Administered 2013-06-01: 50 mg via ORAL
  Filled 2013-06-01 (×2): qty 1

## 2013-06-01 MED ORDER — MIDAZOLAM HCL 5 MG/5ML IJ SOLN
INTRAMUSCULAR | Status: DC | PRN
  Administered 2013-06-01: 2 mg via INTRAVENOUS
  Administered 2013-06-01: 1 mg via INTRAVENOUS

## 2013-06-01 MED ORDER — ONDANSETRON HCL 4 MG/2ML IJ SOLN
4.0000 mg | Freq: Four times a day (QID) | INTRAMUSCULAR | Status: DC | PRN
Start: 2013-06-01 — End: 2013-06-02

## 2013-06-01 MED ORDER — FENTANYL CITRATE 0.05 MG/ML IJ SOLN
INTRAMUSCULAR | Status: DC | PRN
  Administered 2013-06-01 (×2): 50 ug via INTRAVENOUS

## 2013-06-01 MED ORDER — METFORMIN HCL ER 500 MG PO TB24
2000.0000 mg | ORAL_TABLET | Freq: Every day | ORAL | Status: DC
Start: 2013-06-01 — End: 2013-06-02
  Filled 2013-06-01 (×2): qty 1

## 2013-06-01 MED ORDER — ACETAMINOPHEN 325 MG PO TABS
650.0000 mg | ORAL_TABLET | ORAL | Status: DC | PRN
  Administered 2013-06-01: 650 mg via ORAL
  Filled 2013-06-01: qty 2

## 2013-06-01 MED ORDER — HEPARIN SODIUM (PORCINE) 1000 UNIT/ML IJ SOLN
INTRAMUSCULAR | Status: AC
  Filled 2013-06-01: qty 1

## 2013-06-01 MED ORDER — SODIUM CHLORIDE 0.9 % IJ SOLN
3.0000 mL | Freq: Two times a day (BID) | INTRAMUSCULAR | Status: DC
  Administered 2013-06-01: 3 mL via INTRAVENOUS

## 2013-06-01 MED ORDER — PROPOFOL 10 MG/ML IV BOLUS
INTRAVENOUS | Status: DC | PRN
  Administered 2013-06-01: 200 mg via INTRAVENOUS

## 2013-06-01 MED ORDER — HYDROCODONE-ACETAMINOPHEN 5-325 MG PO TABS: 1.0000 | ORAL_TABLET | ORAL | Status: DC | PRN

## 2013-06-01 MED ORDER — FUROSEMIDE 40 MG PO TABS
40.0000 mg | ORAL_TABLET | Freq: Every day | ORAL | Status: DC
  Administered 2013-06-01: 40 mg via ORAL
  Filled 2013-06-01 (×2): qty 1

## 2013-06-01 MED ORDER — TAMSULOSIN HCL 0.4 MG PO CAPS
0.4000 mg | ORAL_CAPSULE | Freq: Every day | ORAL | Status: DC
  Administered 2013-06-01: 0.4 mg via ORAL
  Filled 2013-06-01 (×2): qty 1

## 2013-06-01 MED ORDER — SODIUM CHLORIDE 0.9 % IV SOLN
10.0000 mg | INTRAVENOUS | Status: DC | PRN
  Administered 2013-06-01: 25 ug/min via INTRAVENOUS

## 2013-06-01 MED ORDER — SODIUM CHLORIDE 0.9 % IV SOLN
INTRAVENOUS | Status: DC | PRN
  Administered 2013-06-01: 11:00:00 via INTRAVENOUS

## 2013-06-01 MED ORDER — SODIUM CHLORIDE 0.9 % IV SOLN: 250.0000 mL | INTRAVENOUS | Status: DC | PRN

## 2013-06-01 MED ORDER — GLIMEPIRIDE 4 MG PO TABS
8.0000 mg | ORAL_TABLET | Freq: Every day | ORAL | Status: DC
  Administered 2013-06-02: 8 mg via ORAL
  Filled 2013-06-01 (×2): qty 2

## 2013-06-01 MED ORDER — ONDANSETRON HCL 4 MG/2ML IJ SOLN
INTRAMUSCULAR | Status: DC | PRN
  Administered 2013-06-01: 4 mg via INTRAVENOUS

## 2013-06-01 MED ORDER — SODIUM CHLORIDE 0.9 % IJ SOLN: 3.0000 mL | INTRAMUSCULAR | Status: DC | PRN

## 2013-06-01 MED ORDER — LOSARTAN POTASSIUM 50 MG PO TABS
50.0000 mg | ORAL_TABLET | Freq: Every day | ORAL | Status: DC
  Administered 2013-06-01: 50 mg via ORAL
  Filled 2013-06-01 (×2): qty 1

## 2013-06-01 MED ORDER — LIRAGLUTIDE 18 MG/3ML ~~LOC~~ SOPN: 1.8000 mg | PEN_INJECTOR | Freq: Every day | SUBCUTANEOUS | Status: DC

## 2013-06-01 MED ORDER — ASPIRIN EC 81 MG PO TBEC
81.0000 mg | DELAYED_RELEASE_TABLET | Freq: Every day | ORAL | Status: DC
  Administered 2013-06-01: 81 mg via ORAL
  Filled 2013-06-01 (×2): qty 1

## 2013-06-01 NOTE — Anesthesia Postprocedure Evaluation (Signed)
  Anesthesia Post-op Note  Patient: Paul [name]  Procedure(s) Performed: Procedure(s): V-TACH ABLATION (N/A)  Patient Location: PACU  Anesthesia Type:General  Level of Consciousness: awake, oriented, sedated and patient cooperative  Airway and Oxygen Therapy: Patient Spontanous Breathing  Post-op Pain: none  Post-op Assessment: Post-op Vital signs reviewed, Patient's Cardiovascular Status Stable, Respiratory Function Stable, Patent Airway, No signs of Nausea or vomiting and Pain level controlled  Post-op Vital Signs: stable  Complications: No apparent anesthesia complications

## 2013-06-01 NOTE — H&P (View-Only) (Signed)
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  He recently moved to our area from [facility] and has established EP care with Dr Graciela Husbands.  He is note to have very frequent PVCs.  Per Dr Graciela Husbands, his device interrogation revealed 25-30% PVC burden over a 3 month period. The patient reports exertional SOB (DOE with 1 flight of stairs).  He also reports worsening BLE edema.  He has frequent palpitations with his PVCs. Today, he denies symptoms of chest pain, orthopnea, PND, dizziness, presyncope, syncope, or prior ICD therapy.  The patient is tolerating medications without difficulties and is otherwise without complaint today.   Past Medical History  Diagnosis Date  . Cataract   . Diabetes mellitus without complication   . Hypertension   . Arthritis   . Hypovitaminosis D   . Hypogonadism male   . Ventricular tachycardia     observed during stress testing, subsequent VT on EPS  . ICD (implantable cardiac defibrillator) in place     MDT implanted in 2011 in [place]  . Ventricular tachycardia/frequent PVCs 10/20/2012    Patient has a implanted defibrillator Nonsustained VT was inducible sustained VT   . 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 Erectile dysfunction    Past Surgical History  Procedure Laterality Date  . Appendectomy    . Eye surgery    . Spine surgery    . Vasectomy    . Tonsillectomy and adenoidectomy  1972  . Knee arthroscopy  1984  . Cardiac defibrillator placement  05/17/2010    MDT ICD implanted in [place]  by Dr Marlowe Sax    Current Outpatient Prescriptions  Medication Sig Dispense Refill  . aspirin 81 MG tablet Take 81 mg by mouth daily.      . celecoxib (CELEBREX) 200 MG capsule Take 1 capsule (200 mg total) by mouth 2 (two) times daily.  180 capsule  1  . furosemide (LASIX)  40 MG tablet Take 1 tablet (40 mg total) by mouth daily.  90 tablet  1  . glimepiride (AMARYL) 4 MG tablet Take 2 tablets (8 mg total) by mouth daily before breakfast.  120 tablet  1  . Liraglutide (VICTOZA) 18 MG/3ML SOPN Inject 0.3 mLs into the skin daily.  9 pen  2  . losartan (COZAAR) 50 MG tablet Take 1 tablet (50 mg total) by mouth daily.  90 tablet  3  . metFORMIN (GLUMETZA) 500 MG (MOD) 24 hr tablet Take 2,000 mg by mouth daily.       . metoprolol (TOPROL-XL) 200 MG 24 hr tablet Take 100 mg by mouth daily.       . tamsulosin (FLOMAX) 0.4 MG CAPS capsule TAKE 1 CAPSULE BY MOUTH ONCE DAILY  90 capsule  2  . testosterone (ANDROGEL) 50 MG/5GM GEL Use four pumps daily  4 Tube  2   No current facility-administered medications for this visit.    Allergies  Allergen Reactions  . Lisinopril     angioedema    History   Social History  . Marital Status: Married    Spouse Name: N/A    Number of Children: N/A  . Years of Education: N/A   Occupational History  . Not on file.   Social History Main Topics  . Smoking status: Never Smoker   .  Smokeless tobacco: Never Used  . Alcohol Use: 1.2 oz/week    1 Glasses of wine, 1 Cans of beer per week     Comment: rarely  . Drug Use: No  . Sexual Activity: Yes    Birth Control/ Protection: Post-menopausal   Other Topics Concern  . 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    Moved here from [facility] [place] 1.5 years ago    Family History  Problem Relation Age of Onset  . Alcohol abuse Mother   . COPD Mother   . Depression Mother   . Heart disease Mother   . Hypertension Mother   . Stroke Mother   . COPD Father   . Heart disease Father   . Hypertension Father   . Stroke Father   . Cancer Father   . Diabetes Sister   . Mental retardation Sister   . Learning disabilities Sister   . Hypertension Sister   . Alcohol abuse Brother   .  Drug abuse Brother   . Heart disease Brother   . Hypertension Brother   . Coronary artery disease Brother     ROS- All systems are reviewed and negative except as per the HPI above  Physical Exam: Filed Vitals:   05/17/13 1119  BP: 154/81  Pulse: 64  Height: 5\' 11"  (1.803 m)  Weight: 248 lb 6.4 oz (112.674 kg)    GEN- The patient is well appearing, alert and oriented x 3 today.   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  I suspect that these likely arise form the inferior LV.  Review of prior ekgs   Assessment and Plan:  1. 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  He has failed medical therapy with metoprolol.  He is a poor candidate for AAD therapy.  I would agree with Dr Graciela Husbands that he is a good candidate for ablation. Therapeutic strategies for ventricular tachycardia/ PVCs including medicine and ablation were discussed in detail with the patient today. Risk, benefits, and alternatives to EP study and radiofrequency ablation were also discussed in detail today. These risks include but are not limited to stroke, bleeding, vascular damage, tamponade, perforation, damage to the heart and other structures, AV block requiring pacemaker, worsening renal function, ICD lead dislodgement and death. The patient understands  these risk and wishes to proceed.  We will therefore proceed with catheter ablation with Carto at the next available time.  2. Nonischemic CM/ chronic systolic dysfunction Hopefully symptoms and EF may improve with ablation  3. HTN Stable No change required today

## 2013-06-01 NOTE — Preoperative (Signed)
Beta Blockers   Reason not to administer Beta Blockers:Not Applicable 

## 2013-06-01 NOTE — Anesthesia Procedure Notes (Signed)
Procedure Name: LMA Insertion Date/Time: 06/01/2013 11:26 AM Performed by: Armandina Gemma Pre-anesthesia Checklist: Patient identified, Timeout performed, Emergency Drugs available, Suction available and Patient being monitored Patient Re-evaluated:Patient Re-evaluated prior to inductionOxygen Delivery Method: Circle system utilized Preoxygenation: Pre-oxygenation with 100% oxygen Intubation Type: IV induction Ventilation: Mask ventilation without difficulty LMA: LMA with gastric port inserted LMA Size: 4.0 Tube type: Oral (20 cc air) Number of attempts: 1 Placement Confirmation: positive ETCO2 and breath sounds checked- equal and bilateral Tube secured with: Tape Dental Injury: Teeth and Oropharynx as per pre-operative assessment  Comments: IV induction Smith LMA AM CRNA atraumatic

## 2013-06-01 NOTE — Brief Op Note (Signed)
S/p PVC ablation along the basal inferoseptal RV See dictation

## 2013-06-01 NOTE — Discharge Summary (Signed)
ELECTROPHYSIOLOGY PROCEDURE DISCHARGE SUMMARY    Patient ID: Paul [name],  MRN: [id number], DOB/AGE: 1946/01/18 67 y.o.  Admit date: 06/01/2013 Discharge date: 06/01/2013  Primary Care Physician: Paul Chris, MD Electrophysiologist: Paul Junker, MD  Primary Discharge Diagnosis:  PVC's status post ablation this admission  Secondary Discharge Diagnosis:  1.  Non ischemic cardiomyopathy 2.  Syncope with inducible VT at EPS - s/p MDT dual chamber ICD 2011 3.  Paroxysmal atrial fibrillation 4.  Hypertension 5.  Diabetes  Allergies  Allergen Reactions  . Lisinopril     angioedema    Procedures This Admission:  1.  Electrophysiology study and radiofrequency ablation of PVC's along the basal inferoseptal RV by Paul [name] on 06-01-2013.  See his dictated op note for full details.  There were no early apparent complications. 2.  ICD interrogation and reprogramming post ablation - see paper chart for full details.  Normal device function.   Brief HPI: Paul [name] is a 67 year old male with a past medical history as outlined above.  He has been noted to have frequent ventricular ectopy on device interrogation corresponding with fatigue and palpitations.  He was referred for evaluation of PVC ablation. Risks, benefits, and alternatives were reviewed with the patient who wished to proceed.    Hospital Course:  The patient was admitted and underwent ablation of his PVC's by Paul [name] on 06-01-2013 with details as outlined above.  He was monitored on telemetry overnight which demonstrated sinus rhythm with occasional ventricular ectopy.  His groin incision was without complication.  He was evaluated by Paul [name] and considered stable for discharge to home. He will follow up in the office in 4 weeks for reassessment of his PVC burden.   Physical Exam: Filed Vitals:   06/01/13 1717 06/01/13 1745 06/01/13 1845 06/02/13 0529  BP: 143/85 148/96 140/90 139/62  Pulse: 84 82  84 67  Temp:    98.5 F (36.9 C)  TempSrc:    Oral  Resp:      Height:      Weight:      SpO2:  95% 94% 95%    GEN- The patient is well appearing, alert and oriented x 3 today.   Head- normocephalic, atraumatic Eyes-  Sclera clear, conjunctiva pink Ears- hearing intact Oropharynx- clear Neck- supple, no JVP Lymph- no cervical lymphadenopathy Lungs- Clear to ausculation bilaterally, normal work of breathing Heart- Regular rate and rhythm with some ectopy, no murmurs, rubs or gallops, PMI not laterally displaced GI- soft, NT, ND, + BS Extremities- no clubbing, cyanosis, stable edema, no hematoma/bruit MS- no significant deformity or atrophy Skin- no rash or lesion Psych- euthymic mood, full affect Neuro- strength and sensation are intact    Labs:   Lab Results  Component Value Date   WBC 6.1 06/01/2013   HGB 13.0 06/01/2013   HCT 38.9* 06/01/2013   MCV 92.6 06/01/2013   PLT 190 06/01/2013    Recent Labs Lab 06/01/13 0900  NA 142  K 3.8  CL 105  CO2 28  BUN 20  CREATININE 0.93  CALCIUM 8.6  GLUCOSE 159*   Discharge Medications:    Medication List    ASK your doctor about these medications       aspirin 81 MG tablet  Take 81 mg by mouth daily.     celecoxib 200 MG capsule  Commonly known as:  CELEBREX  Take 200 mg by mouth 2 (two) times daily.  furosemide 40 MG tablet  Commonly known as:  LASIX  Take 1 tablet (40 mg total) by mouth daily.     glimepiride 4 MG tablet  Commonly known as:  AMARYL  Take 8 mg by mouth daily with breakfast.     losartan 50 MG tablet  Commonly known as:  COZAAR  Take 1 tablet (50 mg total) by mouth daily.     metFORMIN 500 MG (MOD) 24 hr tablet  Commonly known as:  GLUMETZA  Take 2,000 mg by mouth daily.     metoprolol succinate 100 MG 24 hr tablet  Commonly known as:  TOPROL-XL  Take 100 mg by mouth daily. Take with or immediately following a meal.     rosuvastatin 20 MG tablet  Commonly known as:  CRESTOR    Take 20 mg by mouth daily.     tamsulosin 0.4 MG Caps capsule  Commonly known as:  FLOMAX  Take 0.4 mg by mouth daily after supper.     testosterone 50 MG/5GM Gel  Commonly known as:  ANDROGEL  Place onto the skin daily. Use four pumps daily     VICTOZA 18 MG/3ML Sopn  Generic drug:  Liraglutide  Inject 1.8 mg into the skin daily.        Disposition:       Future Appointments Provider Department Dept Phone   06/14/2013 10:55 AM [facility] [employer] [phone]       Duration of Discharge Encounter: Greater than 30 minutes including physician time.  Signed, Paul Range MD

## 2013-06-01 NOTE — Interval H&P Note (Signed)
History and Physical Interval Note:  06/01/2013 11:29 AM  Phillips Odor  has presented today for surgery, with the diagnosis of vt  The various methods of treatment have been discussed with the patient and family. After consideration of risks, benefits and other options for treatment, the patient has consented to  Procedure(s): V-TACH ABLATION (N/A) as a surgical intervention .  The patient's history has been reviewed, patient examined, no change in status, stable for surgery.  I have reviewed the patient's chart and labs.  Questions were answered to the patient's satisfaction.     Hillis Range

## 2013-06-01 NOTE — Transfer of Care (Signed)
Immediate Anesthesia Transfer of Care Note  Patient: Paul [name]  Procedure(s) Performed: Procedure(s): V-TACH ABLATION (N/A)  Patient Location: PACU  Anesthesia Type:General  Level of Consciousness: awake, alert  and oriented  Airway & Oxygen Therapy: Patient Spontanous Breathing and Patient connected to nasal cannula oxygen  Post-op Assessment: Report given to PACU RN and Post -op Vital signs reviewed and stable  Post vital signs: Reviewed and stable  Complications: No apparent anesthesia complications

## 2013-06-01 NOTE — Anesthesia Preprocedure Evaluation (Addendum)
Anesthesia Evaluation  Patient identified by MRN, date of birth, ID band  Reviewed: Allergy & Precautions, H&P , NPO status , Patient's Chart, lab work & pertinent test results  History of Anesthesia Complications Negative for: history of anesthetic complications  Airway Mallampati: II TM Distance: >3 FB Neck ROM: Full    Dental  (+) Dental Advisory Given   Pulmonary          Cardiovascular hypertension, Pt. on medications + dysrhythmias Ventricular Tachycardia + Cardiac Defibrillator     Neuro/Psych    GI/Hepatic   Endo/Other  diabetes, Oral Hypoglycemic Agents  Renal/GU      Musculoskeletal   Abdominal   Peds  Hematology   Anesthesia Other Findings   Reproductive/Obstetrics                        Anesthesia Physical Anesthesia Plan  ASA: III  Anesthesia Plan: General   Post-op Pain Management:    Induction: Intravenous  Airway Management Planned: LMA  Additional Equipment:   Intra-op Plan:   Post-operative Plan: Extubation in OR  Informed Consent: I have reviewed the patients History and Physical, chart, labs and discussed the procedure including the risks, benefits and alternatives for the proposed anesthesia with the patient or authorized representative who has indicated his/her understanding and acceptance.     Plan Discussed with: CRNA, Anesthesiologist and Surgeon  Anesthesia Plan Comments: (LMA as per Dr Katrinka Blazing request- discussed with Allred)       Anesthesia Quick Evaluation

## 2013-06-02 ENCOUNTER — Other Ambulatory Visit: Payer: Self-pay | Admitting: Emergency Medicine

## 2013-06-02 ENCOUNTER — Encounter (HOSPITAL_COMMUNITY): Payer: Self-pay | Admitting: *Deleted

## 2013-06-02 DIAGNOSIS — I472 Ventricular tachycardia: Secondary | ICD-10-CM

## 2013-06-02 LAB — GLUCOSE, CAPILLARY: Glucose-Capillary: 149 mg/dL — ABNORMAL HIGH (ref 70–99)

## 2013-06-02 MED ORDER — MENTHOL 3 MG MT LOZG
1.0000 | LOZENGE | OROMUCOSAL | Status: DC | PRN
  Filled 2013-06-02: qty 9

## 2013-06-02 MED ORDER — METOPROLOL SUCCINATE ER 50 MG PO TB24: 50.0000 mg | ORAL_TABLET | Freq: Every day | ORAL | Status: DC

## 2013-06-02 NOTE — Progress Notes (Signed)
D/c orders received, IV removed with gauze on, pt remains in stable condition, pt meds and instructions reviewed and given to pt; pt d/c to home 

## 2013-06-02 NOTE — Op Note (Signed)
NAMEHEINRICH, FERTIG NO.:  [id number]  MEDICAL RECORD NO.:  [id number]  LOCATION:  3W38C                        FACILITY:  [facility]  PHYSICIAN:  Hillis Range, MD       DATE OF BIRTH:  1946/03/14  DATE OF PROCEDURE: DATE OF DISCHARGE:  06/02/2013                              OPERATIVE REPORT   PREPROCEDURE DIAGNOSIS:  Premature ventricular contractions.  POSTPROCEDURE DIAGNOSIS:  Premature ventricular contractions arising from the basal inferoseptal right ventricle.  PROCEDURES: 1. Comprehensive EP study. 2. Coronary sinus pacing and recording. 3. Three-dimensional mapping of ventricular tachycardia. 4. Radiofrequency ablation of ventricular tachycardia. 5. Arterial blood pressure monitoring. 6. Dual-chamber ICD interrogation and reprogramming.  INTRODUCTION:  Mr. 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  He is status post prior ICD implantation.  He has recently developed progressive symptoms of congestive heart failure. He has been evaluated by Dr. Graciela Husbands and noted to have more than 30% of his beats arising as premature ventricular contractions.  He has failed medical therapy with metoprolol.  He presents today for EP study and radiofrequency ablation.  DESCRIPTION OF PROCEDURE:  Informed written consent was obtained and the patient was brought to the Electrophysiology Lab in the fasting state. He was adequately sedated with intravenous medications as outlined in the anesthesia report.  The patient's ICD was interrogated and tachycardia therapies were programed off.  The patient's right groin was prepped and draped in the usual sterile fashion by the EP lab staff. Using a percutaneous Seldinger technique, one 6, one 7, and one 8-French hemostasis sheaths were placed in the right common femoral vein.  A 4- [place] hemostasis sheath was placed in the right  common femoral artery for blood pressure monitoring.  A 7-French Biosense Webster decapolar catheter was introduced through the right common femoral vein and advanced into the coronary sinus for recording and pacing from this location.  A 6-French quadripolar Josephson catheter was introduced through the right common femoral vein and advanced into the right ventricle for recording and pacing.  This catheter was then pulled back to the His bundle location.  The patient presented to the Electrophysiology Lab in sinus rhythm with frequent premature ventricular contractions.  The PVCs were of a left bundle-branch left superior axis morphology.  The PVC duration measured 136 milliseconds, though there was a long onset to maximum deflection suggestive of potentially an epicardial source.  The patient's PR interval measured 172 milliseconds with a QRS duration of 84 milliseconds and a QT interval 448 milliseconds.  His average RR interval measured 708 milliseconds.  His AH interval measured 69 milliseconds with an HV interval of 57 milliseconds.  Ventricular pacing was performed, which revealed midline concentric decremental VA conduction with a VA Wenckebach cycle length of 340 milliseconds.  Rapid atrial pacing was performed, which revealed no evidence of PR greater than RR.  The AV Wenckebach cycle length was 340 milliseconds.  PVCs could be overdrive suppressed when pacing at 90 beats per minute.  I elected to perform three-dimensional mapping of the PVC focus today.  A Biosense Webster ThermoCool ablation catheter was introduced through the right common femoral vein  and advanced into the right ventricle.  Three-dimensional electroanatomical mapping was performed.  This demonstrated that the earliest ventricular activation arose from the basal 3rd of the right ventricle along the inferoseptal region.  In this location, the stimulus to earliest activation preceding each PVC measured 38  milliseconds.  A QS unipolar electrogram was observed.  Ventricular pace mapping in this location revealed a 97% pace map match when pacing from the site of earliest activation.  This was therefore felt to be the origin of the PVC focus.  A series of 4 radiofrequency applications were delivered at 120, 120, 180, and 120 seconds in duration.  With each radiofrequency application, a flurry of the PVCs were observed initially before quiescence of the PVC focus.  The patient was observed following ablation to have very rare PVCs of the same morphology.  Following ablation, the AH interval measured 70 milliseconds with an HV interval of 57 milliseconds.  The procedure was therefore considered completed. All catheters were removed and the sheaths were aspirated and flushed. The sheaths were removed and hemostasis was assured.  There were no early apparent complications.  His ICD was interrogated; and atrial and ventricular sensing, impedance, and thresholds were unchanged from prior to the procedure.  Tachycardia therapy for return to the previous settings.  CONCLUSIONS: 1. Sinus rhythm with frequent premature ventricular contractions upon     presentation. 2. The PVCs were mapped to the basal 3rd of the right ventricle along     the inferoseptal region.  Extensive ablation was performed in this     location with a significant reduction in PVC burden.  It was felt     that this PVC could potentially have arisen from an epicardial     source, however, he did appear to initially respond well to     ablation. 3. No early apparent complications.     Hillis Range, MD     JA/MEDQ  D:  06/01/2013  T:  06/02/2013  Job:  [id number]  cc:   Duke Salvia, MD, [facility]

## 2013-07-01 ENCOUNTER — Other Ambulatory Visit: Payer: Self-pay | Admitting: Emergency Medicine

## 2013-07-01 DIAGNOSIS — M25471 Effusion, right ankle: Secondary | ICD-10-CM

## 2013-07-01 DIAGNOSIS — M25472 Effusion, left ankle: Principal | ICD-10-CM

## 2013-07-01 MED ORDER — METOLAZONE 2.5 MG PO TABS: ORAL_TABLET | ORAL | Status: DC

## 2013-07-01 MED ORDER — LOSARTAN POTASSIUM 100 MG PO TABS: ORAL_TABLET | ORAL | Status: DC

## 2013-07-01 MED ORDER — FUROSEMIDE 40 MG PO TABS: ORAL_TABLET | ORAL | Status: DC

## 2013-07-08 ENCOUNTER — Ambulatory Visit (INDEPENDENT_AMBULATORY_CARE_PROVIDER_SITE_OTHER): Payer: 59 | Admitting: Internal Medicine

## 2013-07-08 ENCOUNTER — Encounter: Payer: Self-pay | Admitting: Internal Medicine

## 2013-07-08 VITALS — BP 110/74 | HR 82 | Ht 71.0 in | Wt 246.8 lb

## 2013-07-08 DIAGNOSIS — I428 Other cardiomyopathies: Secondary | ICD-10-CM

## 2013-07-08 DIAGNOSIS — I4891 Unspecified atrial fibrillation: Secondary | ICD-10-CM

## 2013-07-08 DIAGNOSIS — I493 Ventricular premature depolarization: Secondary | ICD-10-CM

## 2013-07-08 DIAGNOSIS — I4729 Other ventricular tachycardia: Secondary | ICD-10-CM

## 2013-07-08 DIAGNOSIS — R0602 Shortness of breath: Secondary | ICD-10-CM

## 2013-07-08 DIAGNOSIS — I472 Ventricular tachycardia, unspecified: Secondary | ICD-10-CM

## 2013-07-08 DIAGNOSIS — I420 Dilated cardiomyopathy: Secondary | ICD-10-CM

## 2013-07-08 DIAGNOSIS — Z95810 Presence of automatic (implantable) cardiac defibrillator: Secondary | ICD-10-CM

## 2013-07-08 DIAGNOSIS — I4949 Other premature depolarization: Secondary | ICD-10-CM

## 2013-07-08 LAB — MDC_IDC_ENUM_SESS_TYPE_INCLINIC
Battery Voltage: 3.08 V
Brady Statistic AP VS Percent: 22.33 %
Brady Statistic AS VS Percent: 77.53 %
Brady Statistic RA Percent Paced: 22.38 %
Brady Statistic RV Percent Paced: 0.14 %
Date Time Interrogation Session: 20150122104747
HIGH POWER IMPEDANCE MEASURED VALUE: 190 Ohm
HighPow Impedance: 285 Ohm
HighPow Impedance: 46 Ohm
HighPow Impedance: 59 Ohm
Lead Channel Impedance Value: 513 Ohm
Lead Channel Pacing Threshold Pulse Width: 0.4 ms
Lead Channel Pacing Threshold Pulse Width: 0.4 ms
Lead Channel Sensing Intrinsic Amplitude: 1.875 mV
Lead Channel Setting Pacing Amplitude: 2.5 V
Lead Channel Setting Pacing Pulse Width: 0.4 ms
Lead Channel Setting Sensing Sensitivity: 0.3 mV
MDC IDC MSMT LEADCHNL RA PACING THRESHOLD AMPLITUDE: 1 V
MDC IDC MSMT LEADCHNL RV IMPEDANCE VALUE: 361 Ohm
MDC IDC MSMT LEADCHNL RV PACING THRESHOLD AMPLITUDE: 0.75 V
MDC IDC MSMT LEADCHNL RV SENSING INTR AMPL: 3 mV
MDC IDC SET LEADCHNL RA PACING AMPLITUDE: 2 V
MDC IDC SET ZONE DETECTION INTERVAL: 320 ms
MDC IDC STAT BRADY AP VP PERCENT: 0.05 %
MDC IDC STAT BRADY AS VP PERCENT: 0.08 %
Zone Setting Detection Interval: 350 ms
Zone Setting Detection Interval: 360 ms
Zone Setting Detection Interval: 400 ms

## 2013-07-08 NOTE — Progress Notes (Signed)
PCP: Jenny Reichmann, MD Primary Cardiologist:  Dr Layla Barter is a 68 y.o. male who presents today for electrophysiology followup.  Since his recent PVC ablation, the patient reports doing well.  He denies procedure related complications.  He feels that his PVC burden is slightly improved.  His SOB is better.  His edema persists.    Today, he denies symptoms of palpitations, chest pain,   dizziness, presyncope, syncope, or ICD shocks.  The patient is otherwise without complaint today.   Past Medical History  Diagnosis Date  . Cataract   . Hypertension   . Hypovitaminosis D   . Hypogonadism male   . Ventricular tachycardia     observed during stress testing, subsequent VT on EPS  . 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 Erectile dysfunction   . CHF (congestive heart failure)     "mild" (06/01/2013)  . Type II diabetes mellitus   . GERD (gastroesophageal reflux disease)   . Arthritis     "thumbs, neck, knees" (06/01/2013)   Past Surgical History  Procedure Laterality Date  . Lumbar laminectomy  ~ 2004  . Vasectomy    . Tonsillectomy and adenoidectomy  1972  . Knee arthroscopy Left 1984  . Cardiac defibrillator placement  05/09/2010    MDT ICD implanted in [place] by Dr Ky Barban  . Ventricular ablation surgery  06/01/2013  . Appendectomy  1969  . Cataract extraction w/ intraocular lens  implant, bilateral Bilateral   . Cardiac catheterization  04/2010  . Ablation  06-01-2013    PVC's ablated along basal inferoseptal RV by Dr Rayann Heman    Current Outpatient Prescriptions  Medication Sig Dispense Refill  . ANDROGEL PUMP 20.25 MG/ACT (1.62%) GEL APPLY 4 PUMPS TO SKIN DAILY AS DIRECTED  150 g  2  . aspirin 81 MG tablet Take 81 mg by mouth daily.      . celecoxib (CELEBREX) 200 MG capsule Take 200 mg by mouth 2 (two) times daily.      . furosemide (LASIX) 40 MG tablet Take 1-2 tablets daily  180 tablet  1  . glimepiride (AMARYL) 4 MG tablet Take 8 mg by mouth  daily with breakfast.      . Liraglutide (VICTOZA) 18 MG/3ML SOPN Inject 1.8 mg into the skin daily.      Marland Kitchen losartan (COZAAR) 100 MG tablet Take one tablet  daily  90 tablet  3  . metFORMIN (GLUMETZA) 500 MG (MOD) 24 hr tablet Take 2,000 mg by mouth daily.       . metolazone (ZAROXOLYN) 2.5 MG tablet Take one tablet every other day  45 tablet  3  . metoprolol succinate (TOPROL-XL) 50 MG 24 hr tablet Take 1 tablet (50 mg total) by mouth daily.  30 tablet  3  . tamsulosin (FLOMAX) 0.4 MG CAPS capsule Take 0.4 mg by mouth daily after supper.      . rosuvastatin (CRESTOR) 20 MG tablet Take 20 mg by mouth daily.       No current facility-administered medications for this visit.    Physical Exam: Filed Vitals:   07/08/13 1033  BP: 110/74  Pulse: 82  Height: 5\' 11"  (1.803 m)  Weight: 246 lb 12 oz (111.925 kg)    GEN- The patient is well appearing, alert and oriented x 3 today.   Head- normocephalic, atraumatic Eyes-  Sclera clear, conjunctiva pink Ears- hearing intact Oropharynx- clear Lungs- Clear to ausculation bilaterally, normal work of breathing Chest- ICD pocket is  well healed Heart- Regular rate and rhythm with ectopy, no murmurs, rubs or gallops, PMI not laterally displaced GI- soft, NT, ND, + BS Extremities- no clubbing, cyanosis, + dependant edema with venous stasis changes  ICD interrogation- reviewed in detail today,  See PACEART report  Assessment and Plan:  1.  PVCs The patient recently underwent PVC ablation.  His PVCs arise from the [place] of the heart.  The earliest endocardial site and best pacemap was along the basal septal RV.  In this location, the stimulus to earliest activation preceding each PVC measured 38 milliseconds. A QS unipolar electrogram was observed and ventricular pace mapping in this location revealed a 97% pace map match.  Despite extensive ablation in this location, I could not terminate the PVC though initially the PVC burden was decreased.   Unfortunately, ICD interrogation today reveals that his PVC burden approaches his initial burden.  I suspect that epicardial mapping/ ablation would be required to successfully terminate this focus.  I would recommend an initial strategy of medical management for his CHF.  IF he fails medical therapy then we could consider an epicardial procedure down the road.  2. Chronic systolic dysfunction Stable on an appropriate medical regimen Normal ICD function See Pace Art report Enroll in the ICM device clinic No changes today  He will return to follow-up with Dr Caryl Comes in 3 months I will see as needed going forward

## 2013-07-08 NOTE — Patient Instructions (Signed)
Your physician recommends that you schedule a follow-up appointment in: 3 months with Dr Caryl Comes and see Dr Rayann Heman as needed

## 2013-08-09 ENCOUNTER — Ambulatory Visit (INDEPENDENT_AMBULATORY_CARE_PROVIDER_SITE_OTHER): Payer: 59 | Admitting: *Deleted

## 2013-08-09 DIAGNOSIS — Z95810 Presence of automatic (implantable) cardiac defibrillator: Secondary | ICD-10-CM

## 2013-08-09 DIAGNOSIS — I519 Heart disease, unspecified: Secondary | ICD-10-CM

## 2013-08-09 NOTE — Progress Notes (Signed)
EPIC Encounter for ICM Monitoring  Patient Name: Paul [name] is a 68 y.o. male Date: 08/09/2013 Primary Care Physican: Jenny Reichmann, MD Primary Cardiologist: Caryl Comes Electrophysiologist: Klein/ Allred Dry Weight: 246 lbs       In the past month, have you:  1. Gained more than 2 pounds in a day or more than 5 pounds in a week? no  2. Had changes in your medications (with verification of current medications)? no  3. Had more shortness of breath than is usual for you? no  4. Limited your activity because of shortness of breath? no  5. Not been able to sleep because of shortness of breath? no  6. Had increased swelling in your feet or ankles? no  7. Had symptoms of dehydration (dizziness, dry mouth, increased thirst, decreased urine output) no  8. Had changes in sodium restriction? no  9. Been compliant with medication? Yes   ICM trend:   Follow-up plan: ICM clinic phone appointment: 09/09/13  Copy of note sent to patient's primary care physician, primary cardiologist, and device following physician.  Alvis Lemmings, RN, BSN 08/09/2013 5:25 PM

## 2013-09-02 ENCOUNTER — Telehealth: Payer: Self-pay

## 2013-09-02 ENCOUNTER — Other Ambulatory Visit: Payer: Self-pay | Admitting: Emergency Medicine

## 2013-09-02 ENCOUNTER — Other Ambulatory Visit: Payer: Self-pay | Admitting: Radiology

## 2013-09-02 MED ORDER — METOLAZONE 2.5 MG PO TABS: ORAL_TABLET | ORAL | Status: DC

## 2013-09-02 NOTE — Telephone Encounter (Signed)
Pharm called and stated pt reports that Dr Everlene Farrier increased his dose to 1 tab QD instead of QOD. I could not find notes pertaining to this change, but have pended Rx w/these directions. Dr Everlene Farrier, please advise.

## 2013-09-02 NOTE — Telephone Encounter (Signed)
Pharmacy does not have refills on file for Zaroxolyn, have resent

## 2013-09-03 ENCOUNTER — Other Ambulatory Visit: Payer: Self-pay | Admitting: Radiology

## 2013-09-03 DIAGNOSIS — Z5181 Encounter for therapeutic drug level monitoring: Secondary | ICD-10-CM

## 2013-09-03 DIAGNOSIS — E119 Type 2 diabetes mellitus without complications: Secondary | ICD-10-CM

## 2013-09-03 DIAGNOSIS — E785 Hyperlipidemia, unspecified: Secondary | ICD-10-CM

## 2013-09-03 NOTE — Telephone Encounter (Signed)
Dr Everlene Farrier, can Dr Ouida Sills get RFs on this? I don't know when you want to see him again for check up?

## 2013-09-07 ENCOUNTER — Telehealth: Payer: Self-pay

## 2013-09-07 NOTE — Telephone Encounter (Signed)
Duplicate message. 

## 2013-09-07 NOTE — Telephone Encounter (Signed)
Please change the prescription so that the patient takes one a day

## 2013-09-08 MED ORDER — METOLAZONE 2.5 MG PO TABS: ORAL_TABLET | ORAL | Status: DC

## 2013-09-08 NOTE — Telephone Encounter (Signed)
Sent in new RX

## 2013-09-09 ENCOUNTER — Ambulatory Visit (INDEPENDENT_AMBULATORY_CARE_PROVIDER_SITE_OTHER): Payer: 59 | Admitting: *Deleted

## 2013-09-09 ENCOUNTER — Encounter: Payer: Self-pay | Admitting: *Deleted

## 2013-09-09 DIAGNOSIS — Z95810 Presence of automatic (implantable) cardiac defibrillator: Secondary | ICD-10-CM

## 2013-09-09 DIAGNOSIS — I519 Heart disease, unspecified: Secondary | ICD-10-CM

## 2013-09-09 NOTE — Progress Notes (Signed)
EPIC Encounter for ICM Monitoring  Patient Name: Paul [name] is a 68 y.o. male Date: 09/09/2013 Primary Care Physican: Jenny Reichmann, MD Primary Cardiologist: Caryl Comes Electrophysiologist: Caryl Comes Dry Weight: 246 lbs       In the past month, have you:  1. Gained more than 2 pounds in a day or more than 5 pounds in a week? no  2. Had changes in your medications (with verification of current medications)? Yes. Per Dr. Perfecto Kingdom instruction, he is taking lasix 40 mg once daily and metolazone 2.5 mg once daily.  3. Had more shortness of breath than is usual for you? no  4. Limited your activity because of shortness of breath? no  5. Not been able to sleep because of shortness of breath? no  6. Had increased swelling in your feet or ankles? no  7. Had symptoms of dehydration (dizziness, dry mouth, increased thirst, decreased urine output) no  8. Had changes in sodium restriction? no  9. Been compliant with medication? Yes   ICM trend:   Follow-up plan: ICM clinic phone appointment: 10/11/13. The patient is due to see Dr. Caryl Comes in April. He did not wish to schedule an appointment at this time. He will call back to schedule. I will follow back up with him next month.  Copy of note sent to patient's primary care physician, primary cardiologist, and device following physician.  Alvis Lemmings, RN, BSN 09/09/2013 4:25 PM

## 2013-09-19 ENCOUNTER — Emergency Department (HOSPITAL_COMMUNITY): Payer: 59

## 2013-09-19 ENCOUNTER — Emergency Department (HOSPITAL_COMMUNITY)
Admission: EM | Admit: 2013-09-19 | Discharge: 2013-09-19 | Disposition: A | Discharge status: Home / Self Care | Payer: 59 | Attending: Emergency Medicine | Admitting: Emergency Medicine

## 2013-09-19 ENCOUNTER — Ambulatory Visit (INDEPENDENT_AMBULATORY_CARE_PROVIDER_SITE_OTHER): Payer: 59 | Admitting: Emergency Medicine

## 2013-09-19 ENCOUNTER — Encounter (HOSPITAL_COMMUNITY): Payer: Self-pay | Admitting: Emergency Medicine

## 2013-09-19 DIAGNOSIS — R001 Bradycardia, unspecified: Secondary | ICD-10-CM

## 2013-09-19 DIAGNOSIS — Z79899 Other long term (current) drug therapy: Secondary | ICD-10-CM | POA: Insufficient documentation

## 2013-09-19 DIAGNOSIS — E291 Testicular hypofunction: Secondary | ICD-10-CM | POA: Insufficient documentation

## 2013-09-19 DIAGNOSIS — Z7982 Long term (current) use of aspirin: Secondary | ICD-10-CM | POA: Insufficient documentation

## 2013-09-19 DIAGNOSIS — M129 Arthropathy, unspecified: Secondary | ICD-10-CM | POA: Insufficient documentation

## 2013-09-19 DIAGNOSIS — Z9849 Cataract extraction status, unspecified eye: Secondary | ICD-10-CM | POA: Insufficient documentation

## 2013-09-19 DIAGNOSIS — I509 Heart failure, unspecified: Secondary | ICD-10-CM | POA: Insufficient documentation

## 2013-09-19 DIAGNOSIS — R609 Edema, unspecified: Secondary | ICD-10-CM | POA: Insufficient documentation

## 2013-09-19 DIAGNOSIS — I10 Essential (primary) hypertension: Secondary | ICD-10-CM | POA: Insufficient documentation

## 2013-09-19 DIAGNOSIS — T82190A Other mechanical complication of cardiac electrode, initial encounter: Secondary | ICD-10-CM

## 2013-09-19 DIAGNOSIS — R42 Dizziness and giddiness: Secondary | ICD-10-CM | POA: Insufficient documentation

## 2013-09-19 DIAGNOSIS — I4949 Other premature depolarization: Secondary | ICD-10-CM | POA: Insufficient documentation

## 2013-09-19 DIAGNOSIS — E119 Type 2 diabetes mellitus without complications: Secondary | ICD-10-CM | POA: Insufficient documentation

## 2013-09-19 DIAGNOSIS — I498 Other specified cardiac arrhythmias: Secondary | ICD-10-CM

## 2013-09-19 DIAGNOSIS — Z791 Long term (current) use of non-steroidal anti-inflammatories (NSAID): Secondary | ICD-10-CM | POA: Insufficient documentation

## 2013-09-19 DIAGNOSIS — I493 Ventricular premature depolarization: Secondary | ICD-10-CM

## 2013-09-19 DIAGNOSIS — Z8719 Personal history of other diseases of the digestive system: Secondary | ICD-10-CM | POA: Insufficient documentation

## 2013-09-19 DIAGNOSIS — Z8669 Personal history of other diseases of the nervous system and sense organs: Secondary | ICD-10-CM | POA: Insufficient documentation

## 2013-09-19 DIAGNOSIS — T82118A Breakdown (mechanical) of other cardiac electronic device, initial encounter: Secondary | ICD-10-CM

## 2013-09-19 DIAGNOSIS — Z9889 Other specified postprocedural states: Secondary | ICD-10-CM | POA: Insufficient documentation

## 2013-09-19 DIAGNOSIS — E559 Vitamin D deficiency, unspecified: Secondary | ICD-10-CM | POA: Insufficient documentation

## 2013-09-19 DIAGNOSIS — Z95810 Presence of automatic (implantable) cardiac defibrillator: Secondary | ICD-10-CM | POA: Insufficient documentation

## 2013-09-19 LAB — CBC WITH DIFFERENTIAL/PLATELET
BASOS PCT: 1 % (ref 0–1)
Basophils Absolute: 0.1 10*3/uL (ref 0.0–0.1)
Eosinophils Absolute: 0.2 10*3/uL (ref 0.0–0.7)
Eosinophils Relative: 3 % (ref 0–5)
HEMATOCRIT: 38.7 % — AB (ref 39.0–52.0)
Hemoglobin: 13.5 g/dL (ref 13.0–17.0)
Lymphocytes Relative: 25 % (ref 12–46)
Lymphs Abs: 1.7 10*3/uL (ref 0.7–4.0)
MCH: 32 pg (ref 26.0–34.0)
MCHC: 34.9 g/dL (ref 30.0–36.0)
MCV: 91.7 fL (ref 78.0–100.0)
MONOS PCT: 5 % (ref 3–12)
Monocytes Absolute: 0.4 10*3/uL (ref 0.1–1.0)
Neutro Abs: 4.6 10*3/uL (ref 1.7–7.7)
Neutrophils Relative %: 66 % (ref 43–77)
Platelets: 207 10*3/uL (ref 150–400)
RBC: 4.22 MIL/uL (ref 4.22–5.81)
RDW: 13.1 % (ref 11.5–15.5)
WBC: 7 10*3/uL (ref 4.0–10.5)

## 2013-09-19 LAB — BASIC METABOLIC PANEL
BUN: 22 mg/dL (ref 6–23)
CALCIUM: 8.6 mg/dL (ref 8.4–10.5)
CHLORIDE: 95 meq/L — AB (ref 96–112)
CO2: 25 mEq/L (ref 19–32)
Creatinine, Ser: 1.12 mg/dL (ref 0.50–1.35)
GFR calc Af Amer: 77 mL/min — ABNORMAL LOW (ref >90)
GFR calc non Af Amer: 67 mL/min — ABNORMAL LOW (ref >90)
Glucose, Bld: 217 mg/dL — ABNORMAL HIGH (ref 70–99)
Potassium: 3.9 mEq/L (ref 3.7–5.3)
Sodium: 138 mEq/L (ref 137–147)

## 2013-09-19 LAB — PRO B NATRIURETIC PEPTIDE: Pro B Natriuretic peptide (BNP): 385.1 pg/mL — ABNORMAL HIGH (ref 0–125)

## 2013-09-19 LAB — I-STAT TROPONIN, ED: Troponin i, poc: 0 ng/mL (ref 0.00–0.08)

## 2013-09-19 LAB — MAGNESIUM: MAGNESIUM: 1.3 mg/dL — AB (ref 1.5–2.5)

## 2013-09-19 MED ORDER — ATROPINE SULFATE 0.1 MG/ML IJ SOLN
INTRAMUSCULAR | Status: AC
  Filled 2013-09-19: qty 10

## 2013-09-19 MED ORDER — MAGNESIUM SULFATE 40 MG/ML IJ SOLN
2.0000 g | Freq: Once | INTRAMUSCULAR | Status: AC
  Administered 2013-09-19: 2 g via INTRAVENOUS
  Filled 2013-09-19: qty 50

## 2013-09-19 MED ORDER — ATROPINE SULFATE 1 MG/ML IJ SOLN: 1.0000 mg | Freq: Once | INTRAMUSCULAR | Status: DC

## 2013-09-19 NOTE — Discharge Instructions (Signed)
You were evaluated today for lightheadedness. Your pacemaker and defibrillator showed no evidence of new event. You do continue to have frequent PVCs. Your magnesium level was slightly low and this was replaced. You need to followup with cardiology this week.  Hypomagnesemia Magnesium is a common ion (mineral) in the body which is needed for metabolism. It is about how the body handles food and other chemical reactions necessary for life. Only about 2% of the magnesium in our body is found in the blood. When this is low, it is called hypomagnesemia. The blood will measure only a tiny amount of the magnesium in our body. When it is low in our blood, it does not mean that the whole body supply is low. The normal serum concentration ranges from 1.8-2.5 mEq/L. When the level gets to be less than 1.0 mEq/L, a number of problems begin to happen.  CAUSES   Receiving intravenous fluids without magnesium replacement.  Loss of magnesium from the bowel by naso-gastric suction.  Loss of magnesium from nausea and vomiting or severe diarrhea. Any of the inflammatory bowel conditions can cause this.  Abuse of alcohol often leads to low serum magnesium.  An inherited form of magnesium loss happens when the kidneys lose magnesium. This is called familial or primary hypomagnesemia.  Some medications such as diuretics also cause the loss of magnesium. SYMPTOMS  These following problems are worse if the changes in magnesium levels come on suddenly.  Tremor.  Confusion.  Muscle weakness.  Over-sensitive to sights and sounds.  Sensitive reflexes.  Depression.  Muscular fibrillations.  Over-reactivity of the nerves.  Irritability.  Psychosis.  Spasms of the hand muscles.  Tetany (where the muscles go into uncontrollable spasms). DIAGNOSIS  This condition can be diagnosed by blood tests. TREATMENT   In emergency, magnesium can be given intravenously (by vein).  If the condition is less  worrisome, it can be corrected by diet. High levels of magnesium are found in green leafy vegetables, peas, beans and nuts among other things. It can also be given through medications by mouth.  If it is being caused by medications, changes can be made.  If alcohol is a problem, help is available if there are difficulties giving it up. Document Released: 02/27/2005 Document Revised: 08/26/2011 Document Reviewed: 01/22/2008 [facility] Patient Information 2014 [place].

## 2013-09-19 NOTE — ED Notes (Signed)
Pt arrived already in gown; pt placed on monitor, continuous pulse oximetry and blood pressure cuff

## 2013-09-19 NOTE — Progress Notes (Signed)
   Subjective:    Patient ID: Paul [name], male    DOB: 1945/12/31, 68 y.o.   MRN: [id number]  HPI patient presents with 2 episodes of dizziness this morning. A pulse ox was applied and registered a heart rate of 43. His history is complicated by a history of having a pacemaker and defibrillator. He is status post ablation . He denies chest pain or shortness of breath   Review of Systems     Objective:   Physical Exam patient is alert and cooperative. His chest is clear. Cardiac exam reveals a very erratic rhythm. No murmur was heard. Extremities reveal 2+ edema to        Assessment & Plan:  EKG was done and shows very erratic rhythm. At times he has pes of the QRS. There appears to be frequent PVCs. On the monitor there appeared to be couplets. On one section of the rhythm strip there appeared to be approximately 2 second pause. I did not see any pacer complexes. Patient placed on a monitor he was sent to the  Hospital for  evaluation there by cardiology.

## 2013-09-19 NOTE — ED Notes (Signed)
Checked on pt; pt states he is doing ok; no needs at this time

## 2013-09-19 NOTE — ED Notes (Signed)
Pt moved from trauma C to room D36; pt placed back on monitor, continuous pulse oximetry and blood pressure cuff

## 2013-09-19 NOTE — ED Notes (Signed)
PT was at work and felt dizzy at work and checked a pulse Ox. Staff called 911 due to a HR of 30.

## 2013-09-19 NOTE — ED Provider Notes (Signed)
CSN: [id number]     Arrival date & time 09/19/13  1142 History   First MD Initiated Contact with Patient 09/19/13 1149     Chief Complaint  Patient presents with  . Bradycardia     (Consider location/radiation/quality/duration/timing/severity/associated sxs/prior Treatment) HPI  This is a 78 rolled male with history of hypertension, ventricular tachycardia status post ICD placement, congestive heart failure who presents with lightheadedness and bradycardia. Patient is an urgent care physician. He had an episode of 20-30 seconds of lightheadedness. He denies any room spinning dizziness. He denies any chest pain or shortness of breath. Patient reports that he took his pulse and his pulse was in the 30s confirmed by pulse ox. His partner took an EKG and called EMS. Patient reports that it is not uncommon for him to feel lightheaded for 20-30 seconds. He currently has no complaints and reports that he is at his baseline. He has baseline bilateral lower extremity swelling for which he takes Lasix. He has had no recent changes in medication. He did have a failed ablation in December of 2014.  Past Medical History  Diagnosis Date  . Cataract   . Hypertension   . Hypovitaminosis D   . Hypogonadism male   . Ventricular tachycardia     observed during stress testing, subsequent VT on EPS  . 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 Erectile dysfunction   . CHF (congestive heart failure)     "mild" (06/01/2013)  . Type II diabetes mellitus   . GERD (gastroesophageal reflux disease)   . Arthritis     "thumbs, neck, knees" (06/01/2013)   Past Surgical History  Procedure Laterality Date  . Lumbar laminectomy  ~ 2004  . Vasectomy    . Tonsillectomy and adenoidectomy  1972  . Knee arthroscopy Left 1984  . Cardiac defibrillator placement  05/09/2010    MDT ICD implanted in [place] by Dr Ky Barban  . Ventricular ablation surgery  06/01/2013  . Appendectomy  1969  . Cataract extraction w/  intraocular lens  implant, bilateral Bilateral   . Cardiac catheterization  04/2010  . Ablation  06-01-2013    PVC's ablated along basal inferoseptal RV by Dr Rayann Heman   Family History  Problem Relation Age of Onset  . Alcohol abuse Mother   . COPD Mother   . Depression Mother   . Heart disease Mother   . Hypertension Mother   . Stroke Mother   . COPD Father   . Heart disease Father   . Hypertension Father   . Stroke Father   . Cancer Father   . Diabetes Sister   . Mental retardation Sister   . Learning disabilities Sister   . Hypertension Sister   . Alcohol abuse Brother   . Drug abuse Brother   . Heart disease Brother   . Hypertension Brother   . Coronary artery disease Brother    History  Substance Use Topics  . Smoking status: Never Smoker   . Smokeless tobacco: Never Used  . Alcohol Use: 1.2 oz/week    1 Glasses of wine, 1 Cans of beer per week    Review of Systems  Constitutional: Negative.  Negative for fever.  Respiratory: Negative.  Negative for chest tightness and shortness of breath.   Cardiovascular: Positive for leg swelling. Negative for chest pain.  Gastrointestinal: Negative.   Genitourinary: Negative.  Negative for dysuria.  Neurological: Positive for light-headedness. Negative for syncope, weakness and headaches.  All other systems reviewed and are  negative.      Allergies  Lisinopril  Home Medications   Current Outpatient Rx  Name  Route  Sig  Dispense  Refill  . aspirin 81 MG tablet   Oral   Take 81 mg by mouth daily.         . celecoxib (CELEBREX) 200 MG capsule   Oral   Take 200 mg by mouth 2 (two) times daily.         . Cholecalciferol ([employer] VIT D 5000 HIGH-POTENCY PO)   Oral   Take 10,000 Units by mouth daily.         . furosemide (LASIX) 40 MG tablet      Take 1-2 tablets daily   180 tablet   1   . glimepiride (AMARYL) 4 MG tablet   Oral   Take 8 mg by mouth daily with breakfast.         . Liraglutide  (VICTOZA) 18 MG/3ML SOPN   Subcutaneous   Inject 1.8 mg into the skin daily.         Marland Kitchen losartan (COZAAR) 100 MG tablet      Take one tablet  daily   90 tablet   3   . metFORMIN (GLUMETZA) 500 MG (MOD) 24 hr tablet   Oral   Take 2,000 mg by mouth daily.          . metolazone (ZAROXOLYN) 2.5 MG tablet      Take one tablet every day   90 tablet   3   . metoprolol succinate (TOPROL-XL) 50 MG 24 hr tablet   Oral   Take 1 tablet (50 mg total) by mouth daily.   30 tablet   3   . rosuvastatin (CRESTOR) 20 MG tablet   Oral   Take 20 mg by mouth daily.         . tamsulosin (FLOMAX) 0.4 MG CAPS capsule   Oral   Take 0.4 mg by mouth daily.          . Testosterone (ANDROGEL) 20.25 MG/1.25GM (1.62%) GEL   Transdermal   Place 4 Applicatorfuls onto the skin daily.          BP 130/62  Pulse 29  Temp(Src) 99.1 F (37.3 C) (Oral)  Resp 19  SpO2 98% Physical Exam  Nursing note and vitals reviewed. Constitutional: He is oriented to person, place, and time. He appears well-developed and well-nourished. No distress.  HENT:  Head: Normocephalic and atraumatic.  Mouth/Throat: Oropharynx is clear and moist.  Eyes: Pupils are equal, round, and reactive to light.  Neck: No JVD present.  Cardiovascular: Normal rate and normal heart sounds.   No murmur heard. Irregular rhythm  Pulmonary/Chest: Effort normal and breath sounds normal. No respiratory distress. He has no wheezes. He has no rales.  Abdominal: Soft. There is no tenderness.  Musculoskeletal: He exhibits edema.  2+ bilateral lower extremity edema  Lymphadenopathy:    He has no cervical adenopathy.  Neurological: He is alert and oriented to person, place, and time.  Skin: Skin is warm and dry.  Psychiatric: He has a normal mood and affect.    ED Course  Procedures (including critical care time) Labs Review Labs Reviewed  CBC WITH DIFFERENTIAL - Abnormal; Notable for the following:    HCT 38.7 (*)    All  other components within normal limits  PRO B NATRIURETIC PEPTIDE - Abnormal; Notable for the following:    Pro B Natriuretic peptide (BNP) 385.1 (*)  All other components within normal limits  BASIC METABOLIC PANEL - Abnormal; Notable for the following:    Chloride 95 (*)    Glucose, Bld 217 (*)    GFR calc non Af Amer 67 (*)    GFR calc Af Amer 77 (*)    All other components within normal limits  MAGNESIUM - Abnormal; Notable for the following:    Magnesium 1.3 (*)    All other components within normal limits  I-STAT TROPOININ, ED   Imaging Review Dg Chest Portable 1 View  09/19/2013   CLINICAL DATA:  Bradycardia  EXAM: PORTABLE CHEST - 1 VIEW  COMPARISON:  10/22/2012  FINDINGS: Cardiomediastinal silhouette is stable. Dual lead cardiac pacemaker is unchanged in position. No acute infiltrate or pleural effusion. No pulmonary edema. Degenerative changes bilateral acromioclavicular joints.  IMPRESSION: No active disease.  Dual lead cardiac pacemaker in place.   Electronically Signed   By: Lahoma Crocker M.D.   On: 09/19/2013 12:40     EKG Interpretation   Date/Time:  Sunday September 19 2013 11:45:46 EDT Ventricular Rate:  109 PR Interval:    QRS Duration: 164 QT Interval:  389 QTC Calculation: 524 R Axis:   -62 Text Interpretation:  Sinus rhythm with frequent PVC Ventricular  tachycardia, unsustained Right bundle branch block T wave inversions  anteriorly Confirmed by Chontel Warning  MD, Soul Deveney ([id number]) on 09/19/2013 12:01:00  PM      MDM   Final diagnoses:  Lightheadedness  PVC's (premature ventricular contractions)  Hypomagnesemia    Patient presents with lightheadedness. Concern for bradycardia. Patient's heart rate is in the 60s. He does have frequent PVCs. EKG shows similar EKG to prior when compared to EKG in January 2015. Patient's ICD was interrogated. Report notes an average of 643 PVCs per day which is down from 775 when interrogated and January. He has had 11 episodes of  nonsustained V. tach. No episodes of defibrillation.  Patient noted to have frequent PVCs prior to arrival and suspect that PVCs were not being perfused. Patient is currently awake, alert, and oriented without complaint. He states that he is at his baseline.  Review the patient's chart and he is followed by Dr. Rayann Heman and Dr. Caryl Comes. Will obtain basic labwork including BMP and magnesium. Discuss with Dr. Aundra Dubin on call cardiologist.  If workup reassuring, patient to followup with Dr. Caryl Comes. Dr. Aundra Dubin to arrange for followup.    1:38 PM Magnesium noted to be low. This will be replaced. Patient has had no recurrent episodes of lightheadedness. He has been on the monitor without significant arrhythmia or noted increased PVC frequency.  After history, exam, and medical workup I feel the patient has been appropriately medically screened and is safe for discharge home. Pertinent diagnoses were discussed with the patient. Patient was given return precautions.     Merryl Hacker, MD 09/19/13 [phone]

## 2013-09-24 ENCOUNTER — Ambulatory Visit (INDEPENDENT_AMBULATORY_CARE_PROVIDER_SITE_OTHER): Payer: 59 | Admitting: Internal Medicine

## 2013-09-24 ENCOUNTER — Encounter: Payer: Self-pay | Admitting: Internal Medicine

## 2013-09-24 VITALS — BP 142/62 | HR 87 | Ht 72.0 in | Wt 243.4 lb

## 2013-09-24 DIAGNOSIS — I472 Ventricular tachycardia, unspecified: Secondary | ICD-10-CM

## 2013-09-24 DIAGNOSIS — I4729 Other ventricular tachycardia: Secondary | ICD-10-CM

## 2013-09-24 LAB — MDC_IDC_ENUM_SESS_TYPE_INCLINIC
Brady Statistic AP VS Percent: 16.94 %
Brady Statistic AS VP Percent: 0.09 %
Brady Statistic AS VS Percent: 82.93 %
Brady Statistic RA Percent Paced: 16.99 %
Date Time Interrogation Session: 20150410165504
HighPow Impedance: 190 Ohm
HighPow Impedance: 285 Ohm
HighPow Impedance: 43 Ohm
HighPow Impedance: 54 Ohm
Lead Channel Impedance Value: 475 Ohm
Lead Channel Pacing Threshold Pulse Width: 0.4 ms
Lead Channel Pacing Threshold Pulse Width: 0.4 ms
Lead Channel Sensing Intrinsic Amplitude: 1.625 mV
Lead Channel Setting Pacing Amplitude: 2.5 V
Lead Channel Setting Pacing Pulse Width: 0.4 ms
Lead Channel Setting Sensing Sensitivity: 0.3 mV
MDC IDC MSMT BATTERY VOLTAGE: 3.09 V
MDC IDC MSMT LEADCHNL RA PACING THRESHOLD AMPLITUDE: 0.875 V
MDC IDC MSMT LEADCHNL RV IMPEDANCE VALUE: 361 Ohm
MDC IDC MSMT LEADCHNL RV PACING THRESHOLD AMPLITUDE: 0.75 V
MDC IDC MSMT LEADCHNL RV SENSING INTR AMPL: 2.875 mV
MDC IDC SET LEADCHNL RA PACING AMPLITUDE: 2 V
MDC IDC SET ZONE DETECTION INTERVAL: 320 ms
MDC IDC SET ZONE DETECTION INTERVAL: 360 ms
MDC IDC STAT BRADY AP VP PERCENT: 0.05 %
MDC IDC STAT BRADY RV PERCENT PACED: 0.14 %
Zone Setting Detection Interval: 350 ms
Zone Setting Detection Interval: 400 ms

## 2013-09-24 MED ORDER — MEXILETINE HCL 200 MG PO CAPS
200.0000 mg | ORAL_CAPSULE | Freq: Two times a day (BID) | ORAL | Status: DC
Start: 2013-09-24 — End: 2014-05-06

## 2013-09-24 NOTE — Progress Notes (Signed)
Patient Care Team: Darlyne Russian, MD as PCP - General (Family Medicine) Darlyne Russian, MD (Family Medicine)   HPI  Paul [name] is a 68 y.o. male Seen in followup for ICD implanted November 2001. He had a history of presyncope and left ventricular dysfunction Ejection fraction was noted to be about 20-30%. EP testing demonstrated sustained ventricular arrhythmias and he underwent ICD implantation  he had frequent PVCs. 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 ARB therapy was initiated He underwent ablation and mapping Dr Greggory Brandy which was unfortunately unsuccessful, with athougts perhaps related to epicardial focus       Past Medical History  Diagnosis Date  . Cataract   . Hypertension   . Hypovitaminosis D   . Hypogonadism male   . Ventricular tachycardia     observed during stress testing, subsequent VT on EPS  . 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 Erectile dysfunction   . CHF (congestive heart failure)     "mild" (06/01/2013)  . Type II diabetes mellitus   . GERD (gastroesophageal reflux disease)   . Arthritis     "thumbs, neck, knees" (06/01/2013)    Past Surgical History  Procedure Laterality Date  . Lumbar laminectomy  ~ 2004  . Vasectomy    . Tonsillectomy and adenoidectomy  1972  . Knee arthroscopy Left 1984  . Cardiac defibrillator placement  05/09/2010    MDT ICD implanted in [place]  by Dr Ky Barban  . Ventricular ablation surgery  06/01/2013  . Appendectomy  1969  . Cataract extraction w/ intraocular lens  implant, bilateral Bilateral   . Cardiac catheterization  04/2010  . Ablation  06-01-2013    PVC's ablated along basal inferoseptal RV by Dr Rayann Heman    Current Outpatient Prescriptions  Medication Sig Dispense Refill  . aspirin 81 MG tablet Take 81 mg by mouth daily.      . celecoxib (CELEBREX) 200 MG capsule Take 200 mg by mouth 2 (two) times daily.      .  Cholecalciferol (CVS VIT D 5000 HIGH-POTENCY PO) Take 10,000 Units by mouth daily.      . furosemide (LASIX) 40 MG tablet Take 1-2 tablets daily  180 tablet  1  . glimepiride (AMARYL) 4 MG tablet Take 8 mg by mouth daily with breakfast.      . Liraglutide (VICTOZA) 18 MG/3ML SOPN Inject 1.8 mg into the skin daily.      Marland Kitchen losartan (COZAAR) 100 MG tablet Take one tablet  daily  90 tablet  3  . metFORMIN (GLUMETZA) 500 MG (MOD) 24 hr tablet Take 2,000 mg by mouth daily.       . metolazone (ZAROXOLYN) 2.5 MG tablet Take one tablet every day  90 tablet  3  . metoprolol succinate (TOPROL-XL) 50 MG 24 hr tablet Take 1 tablet (50 mg total) by mouth daily.  30 tablet  3  . rosuvastatin (CRESTOR) 20 MG tablet Take 20 mg by mouth daily.      . tamsulosin (FLOMAX) 0.4 MG CAPS capsule Take 0.4 mg by mouth daily.       . Testosterone (ANDROGEL) 20.25 MG/1.25GM (1.62%) GEL Place 4 Applicatorfuls onto the skin daily.       No current facility-administered medications for this visit.    Allergies  Allergen Reactions  . Lisinopril     angioedema    Review of Systems negative except from HPI and PMH  Physical Exam BP 142/62  Pulse 87  Ht 6' (1.829 m)  Wt 243 lb 6.4 oz (110.406 kg)  BMI 33.00 kg/m2 Well developed and well nourished in no acute distress HENT normal E scleral and icterus clear Neck Supple JVP flat; carotids brisk and full Clear to ausculation irr Regular rate and rhythm, no murmurs gallops or rub Soft with active bowel sounds No clubbing cyanosis 2+ Edema Alert and oriented, grossly normal motor and sensory function Skin Warm and Dry    Assessment and  Plan  PVCs/Ventricular tach-nonsustained   Cardiomyopathy-NICM  ICD  CHF chronic Systolic  ICD-[employer]  The patient's device was interrogated.  The information was reviewed. No changes were made in the programming.    The patient's burden of PVCs it remains high. It is counted by the device at about 15% but this is  an underestimation as the first beat is sequences typically not identified as a PVC. Still, we will use it as a surrogate.  We have discussed the potential interplay between the PVCs and a cardiomyopathy. We'll undertake an antiarrhythmic suppression trial. In the event that we are able to suppress the PVCs we will then look to see whether there is an impact on left ventricular function that would justify the undertaking of epicardial ablation.  We'll undertake therapy initially with mexiletine 200 mg twice daily; we will reassess in about 2 weeks. We'll add Ranexa at that time if necessary as a sodium channel blocker.  Reassessed in a couple weeks after that we will then make a decision regarding amiodarone as a diagnostic trial related to the above.    His diuretic regime is unusual. Perhaps augmented diuresis could help also. He denies symptoms of sleep apnea.

## 2013-09-24 NOTE — Patient Instructions (Signed)
START MEXILETINE 200 MG TWICE A DAY  Follow up with Dr Caryl Comes on 10/12/13 at 8:45

## 2013-09-29 ENCOUNTER — Other Ambulatory Visit (HOSPITAL_COMMUNITY): Payer: Self-pay | Admitting: Physician Assistant

## 2013-09-29 ENCOUNTER — Telehealth: Payer: Self-pay | Admitting: *Deleted

## 2013-09-29 MED ORDER — METOPROLOL SUCCINATE ER 50 MG PO TB24: 50.0000 mg | ORAL_TABLET | Freq: Every day | ORAL | Status: DC

## 2013-10-05 NOTE — Telephone Encounter (Signed)
Refill for BP medication

## 2013-10-11 ENCOUNTER — Encounter: Payer: Self-pay | Admitting: *Deleted

## 2013-10-11 ENCOUNTER — Encounter: Payer: 59 | Admitting: *Deleted

## 2013-10-12 ENCOUNTER — Encounter: Payer: Self-pay | Admitting: Internal Medicine

## 2013-10-12 ENCOUNTER — Ambulatory Visit (INDEPENDENT_AMBULATORY_CARE_PROVIDER_SITE_OTHER): Payer: 59 | Admitting: Internal Medicine

## 2013-10-12 VITALS — BP 139/89 | HR 90 | Ht 71.0 in | Wt 240.0 lb

## 2013-10-12 DIAGNOSIS — I493 Ventricular premature depolarization: Secondary | ICD-10-CM

## 2013-10-12 DIAGNOSIS — I4949 Other premature depolarization: Secondary | ICD-10-CM

## 2013-10-12 DIAGNOSIS — I420 Dilated cardiomyopathy: Secondary | ICD-10-CM

## 2013-10-12 DIAGNOSIS — I428 Other cardiomyopathies: Secondary | ICD-10-CM

## 2013-10-12 DIAGNOSIS — Z95810 Presence of automatic (implantable) cardiac defibrillator: Secondary | ICD-10-CM

## 2013-10-12 MED ORDER — MAGNESIUM OXIDE 400 MG PO TABS: 400.0000 mg | ORAL_TABLET | Freq: Every day | ORAL | Status: DC

## 2013-10-12 NOTE — Patient Instructions (Addendum)
Your physician has recommended you make the following change in your medication:  1) Start Magnesium Oxide 400mg  daily  Your physician wants you to follow-up in: 3 months with Dr. Caryl Comes.  You will receive a reminder letter in the mail two months in advance. If you don't receive a letter, please call our office to schedule the follow-up appointment.  Please call us in a couple weeks and let us know how medication change is going.  [phone]

## 2013-10-12 NOTE — Progress Notes (Signed)
Patient Care Team: Darlyne Russian, MD as PCP - General (Family Medicine) Darlyne Russian, MD (Family Medicine)   HPI  Paul [name] is a 68 y.o. male Seen in followup for ICD implanted November 2001. He had a history of presyncope and left ventricular dysfunction Ejection fraction was noted to be about 20-30%. EP testing demonstrated sustained ventricular arrhythmias and he underwent ICD implantation  he had frequent PVCs. 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 6/14  ARB therapy was initiated He underwent ablation and mapping Dr Greggory Brandy which was unfortunately unsuccessful, with thoughts perhaps related to epicardial focus     We underrtook therapy initially with mexiletine 200 mg twice daily; we'll plan to reassess in 2 weeks with the anticipation that we would add Ranexa at that time if necessary as a sodium channel blocker also with the consideration of the use of amiodarone  He feels better. He has less edema.   Past Medical History  Diagnosis Date  . Cataract   . Hypertension   . Hypovitaminosis D   . Hypogonadism male   . Ventricular tachycardia     observed during stress testing, subsequent VT on EPS  . 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 Erectile dysfunction   . CHF (congestive heart failure)     "mild" (06/01/2013)  . Type II diabetes mellitus   . GERD (gastroesophageal reflux disease)   . Arthritis     "thumbs, neck, knees" (06/01/2013)    Past Surgical History  Procedure Laterality Date  . Lumbar laminectomy  ~ 2004  . Vasectomy    . Tonsillectomy and adenoidectomy  1972  . Knee arthroscopy Left 1984  . Cardiac defibrillator placement  05/09/2010    MDT ICD implanted in [place] by Dr Ky Barban  . Ventricular ablation surgery  06/01/2013  . Appendectomy  1969  . Cataract extraction w/ intraocular lens  implant, bilateral Bilateral   . Cardiac catheterization  04/2010  . Ablation   06-01-2013    PVC's ablated along basal inferoseptal RV by Dr Rayann Heman    Current Outpatient Prescriptions  Medication Sig Dispense Refill  . aspirin 81 MG tablet Take 81 mg by mouth daily.      . celecoxib (CELEBREX) 200 MG capsule Take 200 mg by mouth 2 (two) times daily.      . Cholecalciferol ([employer]S VIT D 5000 HIGH-POTENCY PO) Take 10,000 Units by mouth daily.      . furosemide (LASIX) 40 MG tablet Take 1-2 tablets daily  180 tablet  1  . glimepiride (AMARYL) 4 MG tablet Take 8 mg by mouth daily with breakfast.      . Liraglutide (VICTOZA) 18 MG/3ML SOPN Inject 1.8 mg into the skin daily.      Marland Kitchen losartan (COZAAR) 100 MG tablet Take one tablet  daily  90 tablet  3  . metFORMIN (GLUMETZA) 500 MG (MOD) 24 hr tablet Take 2,000 mg by mouth daily.       . metolazone (ZAROXOLYN) 2.5 MG tablet Take one tablet every day  90 tablet  3  . metoprolol succinate (TOPROL-XL) 50 MG 24 hr tablet Take 1 tablet (50 mg total) by mouth daily.  90 tablet  0  . mexiletine (MEXITIL) 200 MG capsule Take 1 capsule (200 mg total) by mouth 2 (two) times daily.  60 capsule  5  . rosuvastatin (CRESTOR) 20 MG tablet Take 20 mg by mouth daily.      Marland Kitchen  tamsulosin (FLOMAX) 0.4 MG CAPS capsule Take 0.4 mg by mouth daily.       . Testosterone (ANDROGEL) 20.25 MG/1.25GM (1.62%) GEL Place 4 Applicatorfuls onto the skin daily.       No current facility-administered medications for this visit.    Allergies  Allergen Reactions  . Lisinopril     angioedema    Review of Systems negative except from HPI and PMH  Physical Exam BP 139/89  Pulse 90  Ht 5\' 11"  (1.803 m)  Wt 240 lb (108.863 kg)  BMI 33.49 kg/m2 Well developed and well nourished in no acute distress HENT normal E scleral and icterus clear Neck Supple JVP flat; carotids brisk and full Clear to ausculation Irregular rate and rhythm no murmurs gallops or rub Soft with active bowel sounds No clubbing cyanosis no Edema Alert and oriented, grossly normal  motor and sensory function Skin Warm and Dry    Assessment and  Plan  PVCs/Ventricular tach-nonsustained   Cardiomyopathy-NICM  ICD  CHF chronic Systolic improved  ICD-[employer]  The patient's device was interrogated.  The information was reviewed. No changes were made in the programming.    He is euvolemic today  The patient's burden of PVCs it remains high.  It is not clear as to the burden from his device but appears to be 5-10% we'll watch.  We have discussed the potential interplay between the PVCs and a cardiomyopathy. We will continue antiarrhythmic therapy at this point with mexiletine 200 twice daily and had magnesium. She'll let us know about 2 weeks with his PVC burden is at that point consider use of Ranexa. We've also discussed the role of amiodarone and possible PVC ablation at another center where epicardial access is standard Paul [name]

## 2013-10-14 ENCOUNTER — Other Ambulatory Visit: Payer: Self-pay | Admitting: Family Medicine

## 2013-10-14 DIAGNOSIS — I493 Ventricular premature depolarization: Secondary | ICD-10-CM

## 2013-10-14 NOTE — Progress Notes (Signed)
Needs EKG and rhythm every 2 weeks and send to Dr. Caryl Comes

## 2013-11-15 ENCOUNTER — Encounter: Payer: 59 | Admitting: *Deleted

## 2013-12-01 ENCOUNTER — Other Ambulatory Visit: Payer: Self-pay | Admitting: Emergency Medicine

## 2013-12-01 MED ORDER — CELECOXIB 200 MG PO CAPS: 200.0000 mg | ORAL_CAPSULE | Freq: Two times a day (BID) | ORAL | Status: DC

## 2013-12-01 MED ORDER — ROSUVASTATIN CALCIUM 20 MG PO TABS: 20.0000 mg | ORAL_TABLET | Freq: Every day | ORAL | Status: DC

## 2013-12-01 MED ORDER — LIRAGLUTIDE 18 MG/3ML ~~LOC~~ SOPN: 1.8000 mg | PEN_INJECTOR | Freq: Every day | SUBCUTANEOUS | Status: DC

## 2013-12-03 ENCOUNTER — Other Ambulatory Visit: Payer: Self-pay | Admitting: Emergency Medicine

## 2013-12-31 ENCOUNTER — Other Ambulatory Visit: Payer: Self-pay | Admitting: Physician Assistant

## 2013-12-31 NOTE — Telephone Encounter (Signed)
Sent in Rx (Metoprolol) for patient, when do you want him to follow up ?

## 2014-01-01 NOTE — Telephone Encounter (Signed)
OK to refill

## 2014-01-01 NOTE — Telephone Encounter (Signed)
This may be a duplicate. OK to Refill.

## 2014-01-10 ENCOUNTER — Encounter: Payer: Self-pay | Admitting: *Deleted

## 2014-01-24 ENCOUNTER — Encounter: Payer: 59 | Admitting: *Deleted

## 2014-02-01 ENCOUNTER — Telehealth: Payer: Self-pay | Admitting: Cardiology

## 2014-02-01 NOTE — Telephone Encounter (Signed)
Spoke with pt and informed him that home monitor for implanted device is deactivated. Pt verbalized understanding and said he would send a manual transmission when he got home.

## 2014-03-24 ENCOUNTER — Ambulatory Visit (INDEPENDENT_AMBULATORY_CARE_PROVIDER_SITE_OTHER): Payer: 59 | Admitting: Internal Medicine

## 2014-03-24 ENCOUNTER — Encounter: Payer: Self-pay | Admitting: Internal Medicine

## 2014-03-24 ENCOUNTER — Other Ambulatory Visit: Payer: Self-pay | Admitting: Physician Assistant

## 2014-03-24 VITALS — BP 132/90 | HR 67 | Ht 70.0 in | Wt 242.0 lb

## 2014-03-24 DIAGNOSIS — I5043 Acute on chronic combined systolic (congestive) and diastolic (congestive) heart failure: Secondary | ICD-10-CM

## 2014-03-24 DIAGNOSIS — I472 Ventricular tachycardia, unspecified: Secondary | ICD-10-CM

## 2014-03-24 DIAGNOSIS — I428 Other cardiomyopathies: Secondary | ICD-10-CM

## 2014-03-24 DIAGNOSIS — Z95810 Presence of automatic (implantable) cardiac defibrillator: Secondary | ICD-10-CM

## 2014-03-24 DIAGNOSIS — I429 Cardiomyopathy, unspecified: Secondary | ICD-10-CM

## 2014-03-24 DIAGNOSIS — I493 Ventricular premature depolarization: Secondary | ICD-10-CM

## 2014-03-24 DIAGNOSIS — I4729 Other ventricular tachycardia: Secondary | ICD-10-CM

## 2014-03-24 LAB — MDC_IDC_ENUM_SESS_TYPE_INCLINIC
Brady Statistic AP VS Percent: 14.42 %
Brady Statistic AS VP Percent: 0.07 %
Brady Statistic AS VS Percent: 85.47 %
Brady Statistic RV Percent Paced: 0.12 %
Date Time Interrogation Session: 20151008110030
HIGH POWER IMPEDANCE MEASURED VALUE: 190 Ohm
HighPow Impedance: 247 Ohm
HighPow Impedance: 39 Ohm
HighPow Impedance: 47 Ohm
Lead Channel Impedance Value: 342 Ohm
Lead Channel Impedance Value: 418 Ohm
Lead Channel Pacing Threshold Amplitude: 0.75 V
Lead Channel Pacing Threshold Pulse Width: 0.4 ms
Lead Channel Sensing Intrinsic Amplitude: 1.875 mV
Lead Channel Sensing Intrinsic Amplitude: 2.75 mV
Lead Channel Setting Pacing Amplitude: 2 V
Lead Channel Setting Pacing Amplitude: 2.5 V
Lead Channel Setting Pacing Pulse Width: 0.4 ms
Lead Channel Setting Sensing Sensitivity: 0.3 mV
MDC IDC MSMT BATTERY VOLTAGE: 3.07 V
MDC IDC MSMT LEADCHNL RA SENSING INTR AMPL: 1.375 mV
MDC IDC MSMT LEADCHNL RA SENSING INTR AMPL: 3.25 mV
MDC IDC MSMT LEADCHNL RV PACING THRESHOLD AMPLITUDE: 0.875 V
MDC IDC MSMT LEADCHNL RV PACING THRESHOLD PULSEWIDTH: 0.4 ms
MDC IDC SET ZONE DETECTION INTERVAL: 320 ms
MDC IDC STAT BRADY AP VP PERCENT: 0.05 %
MDC IDC STAT BRADY RA PERCENT PACED: 14.46 %
Zone Setting Detection Interval: 350 ms
Zone Setting Detection Interval: 360 ms
Zone Setting Detection Interval: 400 ms

## 2014-03-24 MED ORDER — SPIRONOLACTONE 25 MG PO TABS: 25.0000 mg | ORAL_TABLET | Freq: Every day | ORAL | Status: DC

## 2014-03-24 NOTE — Patient Instructions (Addendum)
Your physician has recommended you make the following change in your medication:  1) START Aldactone 25 mg daily 2) STOP Zaroxolyn  Your physician recommends that you return for lab work in: 1 weeks for [employer]  Your physician recommends that you return for lab work in: 3 weeks for [employer]  Your physician has requested that you have an echocardiogram. Echocardiography is a painless test that uses sound waves to create images of your heart. It provides your doctor with information about the size and shape of your heart and how well your heart's chambers and valves are working. This procedure takes approximately one hour. There are no restrictions for this procedure.  You have been referred to Heart Failure clinic  Labs today: BMET, Magnesium, Hemoglobin A1C  Your physician recommends that you schedule a follow-up appointment in: 2 months with Dr. Caryl Comes.    Thank you for choosing [facility]!!

## 2014-03-24 NOTE — Progress Notes (Signed)
Patient Care Team: Darlyne Russian, MD as PCP - General (Family Medicine) Darlyne Russian, MD (Family Medicine)   HPI  Paul [name] is a 68 y.o. male Seen in followup for ICD implanted November 2001. He had a history of presyncope and left ventricular dysfunction Ejection fraction was noted to be about 20-30%. EP testing demonstrated sustained ventricular arrhythmias and he underwent ICD implantation  he had frequent PVCs. 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 6/14 He underwent ablation and mapping Dr Greggory Brandy which was unfortunately unsuccessful, with thoughts perhaps related to epicardial focus   We started him on mexiletine 200 mg twice daily to see if we could impact his PVC burden. Followup data was not consummated.  He has complaints now of increasing dyspnea on exertion with some peripheral edema. He has increase his diuretics from 40-80 mg of Lasix daily (almost every day) and daily Zaroxolyn.  . Catheterization November 2011 demonstrated nonischemic disease.   Past Medical History  Diagnosis Date  . Cataract   . Hypertension   . Hypovitaminosis D   . Hypogonadism male   . Ventricular tachycardia     observed during stress testing, subsequent VT on EPS  . 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 Erectile dysfunction   . CHF (congestive heart failure)     "mild" (06/01/2013)  . Type II diabetes mellitus   . GERD (gastroesophageal reflux disease)   . Arthritis     "thumbs, neck, knees" (06/01/2013)    Past Surgical History  Procedure Laterality Date  . Lumbar laminectomy  ~ 2004  . Vasectomy    . Tonsillectomy and adenoidectomy  1972  . Knee arthroscopy Left 1984  . Cardiac defibrillator placement  05/09/2010    MDT ICD implanted in [place] by Dr Ky Barban  . Ventricular ablation surgery  06/01/2013  . Appendectomy  1969  . Cataract extraction w/ intraocular lens  implant, bilateral  Bilateral   . Cardiac catheterization  04/2010  . Ablation  06-01-2013    PVC's ablated along basal inferoseptal RV by Dr Rayann Heman    Current Outpatient Prescriptions  Medication Sig Dispense Refill  . aspirin 81 MG tablet Take 81 mg by mouth daily.      . celecoxib (CELEBREX) 200 MG capsule Take 1 capsule (200 mg total) by mouth 2 (two) times daily.  180 capsule  1  . Cholecalciferol (CVS VIT D 5000 HIGH-POTENCY PO) Take 10,000 Units by mouth daily.      . furosemide (LASIX) 40 MG tablet Take 1-2 tablets daily  180 tablet  1  . glimepiride (AMARYL) 4 MG tablet Take 8 mg by mouth daily with breakfast.      . Liraglutide (VICTOZA) 18 MG/3ML SOPN Inject 1.8 mg into the skin daily.  15 pen  1  . losartan (COZAAR) 100 MG tablet Take one tablet  daily  90 tablet  3  . magnesium oxide (MAG-OX) 400 MG tablet Take 1 tablet (400 mg total) by mouth daily.  30 tablet  3  . metFORMIN (GLUMETZA) 500 MG (MOD) 24 hr tablet Take 2,000 mg by mouth daily.       . metolazone (ZAROXOLYN) 2.5 MG tablet Take one tablet every day  90 tablet  3  . metoprolol succinate (TOPROL-XL) 50 MG 24 hr tablet TAKE 1 TABLET BY MOUTH ONCE DAILY  90 tablet  0  . mexiletine (MEXITIL) 200 MG capsule Take 1  capsule (200 mg total) by mouth 2 (two) times daily.  60 capsule  5  . rosuvastatin (CRESTOR) 20 MG tablet Take 1 tablet (20 mg total) by mouth daily.  90 tablet  1  . tamsulosin (FLOMAX) 0.4 MG CAPS capsule Take 0.4 mg by mouth daily.        No current facility-administered medications for this visit.    Allergies  Allergen Reactions  . Lisinopril     angioedema    Review of Systems negative except from HPI and PMH  Physical Exam BP 132/90  Pulse 67  Ht '5\' 10"'  (1.778 m)  Wt 242 lb (109.77 kg)  BMI 34.72 kg/m2 Well developed and well nourished in no acute distress HENT normal E scleral and icterus clear Neck Supple JVP flat; carotids brisk and full Clear to ausculation Irregular rate and rhythm no murmurs  gallops or rub Soft with active bowel sounds No clubbing cyanosis no Edema Alert and oriented, grossly normal motor and sensory function Skin Warm and Dry    Assessment and  Plan  PVCs/Ventricular tach-nonsustained   Cardiomyopathy-NICM  ICD  CHF chronic Systolic improved  ICD-Medtronic  The patient's device was interrogated.  The information was reviewed. No changes were made in the programming.    He is euvolemic today    He continues to struggle with PVCs and with his worsening symptoms I am concerned that there has been interval deterioration in LV systolic function. We will check an echo. We'll also refer him to the heart clinic.  In the interim, I suggested that we stop his metolazone and use   aldosterone antagonists  we'll check his B-Met today and we'll    recheck it one week and 3 weeks.   He may well be a candidate for Entresto and I will defer this decision to his visit to the heart failure clinic.  As relates to his PVC burden, we will continue his mexiletine. We will reassess his LV function. We will add ranolazine. We will look for a Center for ablation with epicardial expertise

## 2014-03-25 ENCOUNTER — Other Ambulatory Visit (INDEPENDENT_AMBULATORY_CARE_PROVIDER_SITE_OTHER): Payer: 59 | Admitting: Radiology

## 2014-03-25 DIAGNOSIS — E785 Hyperlipidemia, unspecified: Secondary | ICD-10-CM

## 2014-03-25 DIAGNOSIS — E119 Type 2 diabetes mellitus without complications: Secondary | ICD-10-CM

## 2014-03-25 LAB — COMPLETE METABOLIC PANEL WITH GFR
ALBUMIN: 4.2 g/dL (ref 3.5–5.2)
ALK PHOS: 70 U/L (ref 39–117)
ALT: 28 U/L (ref 0–53)
AST: 21 U/L (ref 0–37)
BUN: 35 mg/dL — ABNORMAL HIGH (ref 6–23)
CO2: 28 meq/L (ref 19–32)
Calcium: 8.7 mg/dL (ref 8.4–10.5)
Chloride: 93 mEq/L — ABNORMAL LOW (ref 96–112)
Creat: 1.78 mg/dL — ABNORMAL HIGH (ref 0.50–1.35)
GFR, EST AFRICAN AMERICAN: 45 mL/min — AB
GFR, Est Non African American: 39 mL/min — ABNORMAL LOW
Glucose, Bld: 186 mg/dL — ABNORMAL HIGH (ref 70–99)
POTASSIUM: 3.6 meq/L (ref 3.5–5.3)
SODIUM: 136 meq/L (ref 135–145)
TOTAL PROTEIN: 6.6 g/dL (ref 6.0–8.3)
Total Bilirubin: 0.5 mg/dL (ref 0.2–1.2)

## 2014-03-25 LAB — POCT GLYCOSYLATED HEMOGLOBIN (HGB A1C): HEMOGLOBIN A1C: 9.4

## 2014-03-25 LAB — LIPID PANEL
CHOLESTEROL: 190 mg/dL (ref 0–200)
HDL: 35 mg/dL — ABNORMAL LOW (ref >39)
LDL Cholesterol: 84 mg/dL (ref 0–99)
Total CHOL/HDL Ratio: 5.4 Ratio
Triglycerides: 357 mg/dL — ABNORMAL HIGH (ref <150)
VLDL: 71 mg/dL — ABNORMAL HIGH (ref 0–40)

## 2014-03-30 ENCOUNTER — Ambulatory Visit (HOSPITAL_COMMUNITY): Payer: 59 | Attending: Cardiovascular Disease | Admitting: Radiology

## 2014-03-30 DIAGNOSIS — I5043 Acute on chronic combined systolic (congestive) and diastolic (congestive) heart failure: Secondary | ICD-10-CM

## 2014-03-30 DIAGNOSIS — I472 Ventricular tachycardia: Secondary | ICD-10-CM | POA: Diagnosis not present

## 2014-03-30 NOTE — Progress Notes (Signed)
Echocardiogram performed.  

## 2014-04-06 ENCOUNTER — Other Ambulatory Visit (INDEPENDENT_AMBULATORY_CARE_PROVIDER_SITE_OTHER): Payer: 59 | Admitting: *Deleted

## 2014-04-06 DIAGNOSIS — I5043 Acute on chronic combined systolic (congestive) and diastolic (congestive) heart failure: Secondary | ICD-10-CM

## 2014-04-06 LAB — BASIC METABOLIC PANEL
BUN: 31 mg/dL — ABNORMAL HIGH (ref 6–23)
CHLORIDE: 98 meq/L (ref 96–112)
CO2: 28 meq/L (ref 19–32)
Calcium: 9.1 mg/dL (ref 8.4–10.5)
Creat: 1.6 mg/dL — ABNORMAL HIGH (ref 0.50–1.35)
GLUCOSE: 171 mg/dL — AB (ref 70–99)
POTASSIUM: 4.7 meq/L (ref 3.5–5.3)
SODIUM: 136 meq/L (ref 135–145)

## 2014-04-06 NOTE — Addendum Note (Signed)
Addended byGrant Fontana R on: 04/06/2014 09:13 AM   Modules accepted: Orders

## 2014-04-06 NOTE — Progress Notes (Signed)
Pt here for lab draw only  

## 2014-04-11 ENCOUNTER — Encounter (HOSPITAL_COMMUNITY): Payer: 59

## 2014-04-19 ENCOUNTER — Ambulatory Visit (INDEPENDENT_AMBULATORY_CARE_PROVIDER_SITE_OTHER): Payer: 59 | Admitting: Podiatry

## 2014-04-19 ENCOUNTER — Ambulatory Visit (INDEPENDENT_AMBULATORY_CARE_PROVIDER_SITE_OTHER): Payer: 59

## 2014-04-19 ENCOUNTER — Encounter: Payer: Self-pay | Admitting: Podiatry

## 2014-04-19 VITALS — BP 102/73 | HR 62 | Resp 16 | Ht 71.0 in | Wt 235.0 lb

## 2014-04-19 DIAGNOSIS — M2041 Other hammer toe(s) (acquired), right foot: Secondary | ICD-10-CM

## 2014-04-19 NOTE — Progress Notes (Signed)
Subjective:     Patient ID: Paul [name], male   DOB: 1946/02/12, 68 y.o.   MRN: [id number]  HPIpatient presents concerned because he is developing continued spread between the second and third toes on his right foot that while not bothersome is causing further cosmetic changes. Also unsure about his fifth toe right which he states can become irritated   Review of Systems  All other systems reviewed and are negative.      Objective:   Physical Exam  Constitutional: He is oriented to person, place, and time.  Cardiovascular: Intact distal pulses.   Musculoskeletal: Normal range of motion.  Neurological: He is oriented to person, place, and time.  Skin: Skin is warm.  Nursing note and vitals reviewed. neurovascular status found to be intact with muscle strength adequate and range of motion subtalar midtarsal joint within normal limits. Patient is noted to have a significant spread between the second and third toes and the transverse plane with dorsal elevation of the second toe right and mild irritation between the fourth and fifth toes of the right foot. Patient has mild deformity on the left but not to the same degree and I did not note any pain in the metatarsal phalangeal joints or in the dorsum of the digits themselves     Assessment:     Foot structural issues with probable flexor plate stretch or possible tear which has led to the instability of the forefoot    Plan:     H&P and x-rays reviewed with patient and at this time I do not recommend further treatment due to the lack of physical symptoms. I did apply padding to the fifth toe right take pressure off the toe and this should be adequate for the short-term. Patient will be seen back if symptoms were to develop or the toes were to go to a position that shoe gear is no longer comfortable

## 2014-04-19 NOTE — Progress Notes (Signed)
   Subjective:    Patient ID: Paul [name], male    DOB: 10/11/1945, 68 y.o.   MRN: [id number]  HPI Comments: The toes on the right foot are spreading and getting worse   Toe Pain       Review of Systems  Musculoskeletal:       Joint pain   All other systems reviewed and are negative.      Objective:   Physical Exam        Assessment & Plan:

## 2014-05-03 ENCOUNTER — Encounter (HOSPITAL_COMMUNITY): Payer: Self-pay

## 2014-05-03 ENCOUNTER — Ambulatory Visit (HOSPITAL_COMMUNITY)
Admission: RE | Admit: 2014-05-03 | Discharge: 2014-05-03 | Disposition: A | Discharge status: Home / Self Care | Payer: 59 | Source: Ambulatory Visit | Attending: Cardiology | Admitting: Cardiology

## 2014-05-03 VITALS — BP 128/54 | HR 70 | Wt 239.8 lb

## 2014-05-03 DIAGNOSIS — Z95810 Presence of automatic (implantable) cardiac defibrillator: Secondary | ICD-10-CM | POA: Diagnosis not present

## 2014-05-03 DIAGNOSIS — I429 Cardiomyopathy, unspecified: Secondary | ICD-10-CM | POA: Insufficient documentation

## 2014-05-03 DIAGNOSIS — I2510 Atherosclerotic heart disease of native coronary artery without angina pectoris: Secondary | ICD-10-CM | POA: Insufficient documentation

## 2014-05-03 DIAGNOSIS — Z79899 Other long term (current) drug therapy: Secondary | ICD-10-CM | POA: Diagnosis not present

## 2014-05-03 DIAGNOSIS — E1122 Type 2 diabetes mellitus with diabetic chronic kidney disease: Secondary | ICD-10-CM | POA: Diagnosis not present

## 2014-05-03 DIAGNOSIS — I129 Hypertensive chronic kidney disease with stage 1 through stage 4 chronic kidney disease, or unspecified chronic kidney disease: Secondary | ICD-10-CM | POA: Diagnosis not present

## 2014-05-03 DIAGNOSIS — N189 Chronic kidney disease, unspecified: Secondary | ICD-10-CM | POA: Insufficient documentation

## 2014-05-03 DIAGNOSIS — Z7982 Long term (current) use of aspirin: Secondary | ICD-10-CM | POA: Diagnosis not present

## 2014-05-03 DIAGNOSIS — I5022 Chronic systolic (congestive) heart failure: Secondary | ICD-10-CM | POA: Insufficient documentation

## 2014-05-03 DIAGNOSIS — K219 Gastro-esophageal reflux disease without esophagitis: Secondary | ICD-10-CM | POA: Diagnosis not present

## 2014-05-03 DIAGNOSIS — M25472 Effusion, left ankle: Secondary | ICD-10-CM | POA: Diagnosis not present

## 2014-05-03 DIAGNOSIS — I4729 Other ventricular tachycardia: Secondary | ICD-10-CM

## 2014-05-03 DIAGNOSIS — M25471 Effusion, right ankle: Secondary | ICD-10-CM | POA: Diagnosis not present

## 2014-05-03 DIAGNOSIS — Z8249 Family history of ischemic heart disease and other diseases of the circulatory system: Secondary | ICD-10-CM | POA: Insufficient documentation

## 2014-05-03 DIAGNOSIS — I472 Ventricular tachycardia: Secondary | ICD-10-CM | POA: Insufficient documentation

## 2014-05-03 DIAGNOSIS — I493 Ventricular premature depolarization: Secondary | ICD-10-CM | POA: Diagnosis not present

## 2014-05-03 DIAGNOSIS — I420 Dilated cardiomyopathy: Secondary | ICD-10-CM

## 2014-05-03 LAB — BASIC METABOLIC PANEL
Anion gap: 12 (ref 5–15)
BUN: 35 mg/dL — ABNORMAL HIGH (ref 6–23)
CALCIUM: 9.6 mg/dL (ref 8.4–10.5)
CO2: 26 mEq/L (ref 19–32)
Chloride: 97 mEq/L (ref 96–112)
Creatinine, Ser: 1.5 mg/dL — ABNORMAL HIGH (ref 0.50–1.35)
GFR, EST AFRICAN AMERICAN: 54 mL/min — AB (ref >90)
GFR, EST NON AFRICAN AMERICAN: 47 mL/min — AB (ref >90)
Glucose, Bld: 299 mg/dL — ABNORMAL HIGH (ref 70–99)
Potassium: 4.7 mEq/L (ref 3.7–5.3)
SODIUM: 135 meq/L — AB (ref 137–147)

## 2014-05-03 LAB — PRO B NATRIURETIC PEPTIDE: PRO B NATRI PEPTIDE: 415.8 pg/mL — AB (ref 0–125)

## 2014-05-03 MED ORDER — SACUBITRIL-VALSARTAN 49-51 MG PO TABS: 49.0000 mg | ORAL_TABLET | Freq: Two times a day (BID) | ORAL | Status: DC

## 2014-05-03 MED ORDER — FUROSEMIDE 40 MG PO TABS: ORAL_TABLET | ORAL | Status: DC

## 2014-05-03 NOTE — Patient Instructions (Signed)
START Entresto 49/51 mg twice a day STOP Losartan INCREASE Lasix to 40 mg in the AM and 20 mg in the PM   Labs today and again in 2 weeks (BMET)  Your physician recommends that you schedule a follow-up appointment in: 1 month  Do the following things EVERYDAY: 1) Weigh yourself in the morning before breakfast. Write it down and keep it in a log. 2) Take your medicines as prescribed 3) Eat low salt foods-Limit salt (sodium) to 2000 mg per day.  4) Stay as active as you can everyday 5) Limit all fluids for the day to less than 2 liters 6)

## 2014-05-04 DIAGNOSIS — I5022 Chronic systolic (congestive) heart failure: Secondary | ICD-10-CM | POA: Insufficient documentation

## 2014-05-04 NOTE — Progress Notes (Signed)
Patient ID: Paul [name], male   DOB: 09-Jun-1946, 68 y.o.   MRN: [id number] PCP: Dr. Everlene Farrier Referring MD: Dr. Caryl Comes  68 yo with history of nonischemic cardiomyopathy (possibly PVC-related) and frequent PVCs presents for CHF clinic evaluation. Patient has had a cardiomyopathy known since around 2011.  Cath at that time showed nonobstructive CAD but EF 25%.  He got a Medtronic ICD.  Initial workup was in [place], and he subsequently moved to [place].  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    He has been noted to have frequent PVCs, at one point up to 1/3 of beats.  VT ablation did not help much, but mexiletine seems to be cutting back on PVC frequency. When he last saw Dr. Caryl Comes, Lasix was cut back and metolazone was stopped.  He had been taking metolazone daily for about a year prior to this.  Creatinine was up to 1.6.  Since cutting back on diuretics, he has been feeling ok.  He had a couple of episodes of dyspnea Saturday (walking up steps, walking dog) but otherwise has not had significant exertional dyspnea.  No chest pain.  No orthopnea or PND, but he does have bendopnea.  Rare palpitations now.   I reviewed the echo from 10/15.  It is a bit difficult with PVCs to interpret EF but I would estimate LV EF as 40-45% with mildly dilated and moderately hypokinetic RV.    Optivol was assessed.  Fluid index is trending up but is not above threshold.  Impedance is trending down.   Labs (10/15): K 4.7, creatinine 1.6  PMH: 1. Nonischemic cardiomyopathy: Diagnosed in [place] in 2011.  Possibly related to frequent PVCs.  LHC (1/11) with 40-50% mLAD stenosis, 90% small OM1 stenosis, EF 25%.  Medtronic ICD placed in 2011.  Echo (6/14) with EF 35%.  Echo (10/15) with EF 40-45% (difficult with PVCs), mildly dilated RV with moderately decreased RV systolic function.   2. PVCs: Frequent, at one point up to 1/3 of total beats.  Patient had endocardial ablation in 6/14 that was not successful.   However, PVC count has dropped with initiation of mexiletine.   3. HTN 4. Type II diabetes 5. CAD: See above cath, nonobstructive disease on 1/11 LHC.  6. GERD 7. Appendectomy 8. Lumbar laminectomy 9. CKD 10. Hyperlipidemia: Myalgias with Crestor.   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  FH: Father with MI in his 26s and CVA (smoker), mother with PAD diagnosed in her 39s.   ROS: All systems reviewed and negative except as per HPI.   Current Outpatient Prescriptions  Medication Sig Dispense Refill  . aspirin 81 MG tablet Take 81 mg by mouth daily.    . celecoxib (CELEBREX) 200 MG capsule Take 1 capsule (200 mg total) by mouth 2 (two) times daily. 180 capsule 1  . Cholecalciferol (CVS VIT D 5000 HIGH-POTENCY PO) Take 10,000 Units by mouth daily.    . furosemide (LASIX) 40 MG tablet Take 40 mg in the AM and 20 mg in the PM 90 tablet 2  . glimepiride (AMARYL) 4 MG tablet Take 8 mg by mouth daily with breakfast.    . Liraglutide (VICTOZA) 18 MG/3ML SOPN Inject 1.8 mg into the skin daily. 15 pen 1  . losartan (COZAAR) 100 MG tablet Take one tablet  daily 90 tablet 3  . magnesium oxide (MAG-OX) 400 MG tablet Take 1 tablet (400 mg total) by mouth daily. 30 tablet 3  .  metFORMIN (GLUMETZA) 500 MG (MOD) 24 hr tablet Take 2,000 mg by mouth daily.     . metoprolol succinate (TOPROL-XL) 50 MG 24 hr tablet TAKE 1 TABLET BY MOUTH ONCE DAILY 90 tablet 0  . mexiletine (MEXITIL) 200 MG capsule Take 1 capsule (200 mg total) by mouth 2 (two) times daily. 60 capsule 5  . spironolactone (ALDACTONE) 25 MG tablet Take 1 tablet (25 mg total) by mouth daily. 90 tablet 3  . tamsulosin (FLOMAX) 0.4 MG CAPS capsule Take 0.4 mg by mouth daily.     . Sacubitril-Valsartan (ENTRESTO) 49-51 MG TABS Take 49-51 mg by mouth 2 (two) times daily. 60 tablet 3   No current facility-administered medications for this encounter.   BP 128/54 mmHg  Pulse 70  Wt 239 lb 12.8 oz (108.773 kg)  SpO2  99% General: NAD Neck: JVP 8-9 cm, no thyromegaly or thyroid nodule.  Lungs: Clear to auscultation bilaterally with normal respiratory effort. CV: Nondisplaced PMI.  Heart regular S1/S2, no S3/S4, no murmur.  1+ ankle edema.  No carotid bruit.  Normal pedal pulses.  Abdomen: Soft, nontender, no hepatosplenomegaly, no distention.  Skin: Intact without lesions or rashes.  Neurologic: Alert and oriented x 3.  Psych: Normal affect. Extremities: No clubbing or cyanosis.  HEENT: Normal.   Assessment/Plan:  1. Chronic systolic CHF: Nonischemic cardiomyopathy. Possibly PVC-related as there seems to have been some improvement in EF as PVC burden has gone down.  Last echo appears to show EF 40-45% with moderately decreased RV systolic function.NYHA II symptoms but he does have some volume overload on exam and Optivol suggests that fluid is building up.  He had been taking a higher dose of Lasix and daily metolazone.  This was cut out at last visit with Dr Caryl Comes.   - Increase Lasix to 40 qam, 20 qpm.   - BMET/BNP today and in 1 week.  - I will continue the current Toprol XL dose as he reports considerable fatigue when it was last titrated up.  - Continue spironolactone 25 mg daily.  - I will replace losartan with Entresto 49/51 mg bid with BMET 1 week as above.  2. 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  I would have liked to do a cardiac MRI to look for ARVC (abnormal RV with moderate hypokinesis) and assess for late gadolinium enhancement in the LV.  However, patient has ICD so cannot do MRI.  - Continue mexiletine, good response so far with decreased PVC burden. - I do not think that he will symptomatically tolerate increased Toprol XL. - Possible referral for epicardial PVC ablation.  3. CKD: Will need to follow renal function closely with medication adjustments.    Followup in 1 month.  Loralie Champagne 05/04/2014

## 2014-05-06 ENCOUNTER — Ambulatory Visit (INDEPENDENT_AMBULATORY_CARE_PROVIDER_SITE_OTHER): Payer: 59 | Admitting: Physician Assistant

## 2014-05-06 ENCOUNTER — Other Ambulatory Visit: Payer: Self-pay | Admitting: *Deleted

## 2014-05-06 ENCOUNTER — Other Ambulatory Visit: Payer: Self-pay | Admitting: Internal Medicine

## 2014-05-06 DIAGNOSIS — I4729 Other ventricular tachycardia: Secondary | ICD-10-CM

## 2014-05-06 DIAGNOSIS — I472 Ventricular tachycardia: Secondary | ICD-10-CM

## 2014-05-06 DIAGNOSIS — Z23 Encounter for immunization: Secondary | ICD-10-CM

## 2014-05-06 MED ORDER — MEXILETINE HCL 200 MG PO CAPS: 200.0000 mg | ORAL_CAPSULE | Freq: Two times a day (BID) | ORAL | Status: DC

## 2014-05-06 NOTE — Progress Notes (Signed)
   Subjective:    Patient ID: Paul [name], male    DOB: 06/19/1945, 68 y.o.   MRN: [id number]  HPI Pt here for prevnar vaccine   Review of Systems     Objective:   Physical Exam        Assessment & Plan:

## 2014-05-06 NOTE — Telephone Encounter (Signed)
Pt in office needed refill on medication.

## 2014-05-16 ENCOUNTER — Telehealth: Payer: Self-pay | Admitting: Internal Medicine

## 2014-05-16 NOTE — Telephone Encounter (Signed)
New message           Dr. Osvaldo Angst would like Dr. Caryl Comes to give him a call

## 2014-05-23 ENCOUNTER — Other Ambulatory Visit: Payer: Self-pay | Admitting: *Deleted

## 2014-05-23 MED ORDER — SPIRONOLACTONE 25 MG PO TABS: 25.0000 mg | ORAL_TABLET | Freq: Every day | ORAL | Status: DC

## 2014-05-23 MED ORDER — SPIRONOLACTONE 25 MG PO TABS: 12.5000 mg | ORAL_TABLET | Freq: Every day | ORAL | Status: DC

## 2014-05-23 MED ORDER — METOPROLOL SUCCINATE ER 25 MG PO TB24: 25.0000 mg | ORAL_TABLET | Freq: Every day | ORAL | Status: DC

## 2014-05-23 NOTE — Telephone Encounter (Signed)
Dr. Caryl Comes spoke with Dr. Everlene Farrier.  Dr. Caryl Comes asked me to update med rec:  Not taking Losartan  Taking Metoprolol 25 mg daily  Taking Aldactone 12.5 mg daily

## 2014-05-26 ENCOUNTER — Other Ambulatory Visit (INDEPENDENT_AMBULATORY_CARE_PROVIDER_SITE_OTHER): Payer: 59 | Admitting: Radiology

## 2014-05-26 ENCOUNTER — Encounter (HOSPITAL_COMMUNITY): Payer: Self-pay | Admitting: Internal Medicine

## 2014-05-26 DIAGNOSIS — I509 Heart failure, unspecified: Secondary | ICD-10-CM

## 2014-05-26 LAB — COMPLETE METABOLIC PANEL WITH GFR
ALK PHOS: 86 U/L (ref 39–117)
ALT: 24 U/L (ref 0–53)
AST: 20 U/L (ref 0–37)
Albumin: 4.5 g/dL (ref 3.5–5.2)
BILIRUBIN TOTAL: 0.5 mg/dL (ref 0.2–1.2)
BUN: 33 mg/dL — ABNORMAL HIGH (ref 6–23)
CO2: 28 mEq/L (ref 19–32)
CREATININE: 1.49 mg/dL — AB (ref 0.50–1.35)
Calcium: 9.8 mg/dL (ref 8.4–10.5)
Chloride: 96 mEq/L (ref 96–112)
GFR, Est African American: 56 mL/min — ABNORMAL LOW
GFR, Est Non African American: 48 mL/min — ABNORMAL LOW
Glucose, Bld: 221 mg/dL — ABNORMAL HIGH (ref 70–99)
Potassium: 4.7 mEq/L (ref 3.5–5.3)
Sodium: 135 mEq/L (ref 135–145)
Total Protein: 7.4 g/dL (ref 6.0–8.3)

## 2014-05-26 LAB — POCT CBC
Granulocyte percent: 66.9 %G (ref 37–80)
HCT, POC: 43.3 % — AB (ref 43.5–53.7)
Hemoglobin: 14.3 g/dL (ref 14.1–18.1)
LYMPH, POC: 2.3 (ref 0.6–3.4)
MCH: 31.2 pg (ref 27–31.2)
MCHC: 33 g/dL (ref 31.8–35.4)
MCV: 94.4 fL (ref 80–97)
MID (CBC): 0.5 (ref 0–0.9)
MPV: 7.2 fL (ref 0–99.8)
POC Granulocyte: 5.6 (ref 2–6.9)
POC LYMPH %: 27.4 % (ref 10–50)
POC MID %: 5.7 % (ref 0–12)
Platelet Count, POC: 255 10*3/uL (ref 142–424)
RBC: 4.59 M/uL — AB (ref 4.69–6.13)
RDW, POC: 14.5 %
WBC: 8.3 10*3/uL (ref 4.6–10.2)

## 2014-05-26 LAB — BRAIN NATRIURETIC PEPTIDE: Brain Natriuretic Peptide: 25.6 pg/mL (ref 0.0–100.0)

## 2014-05-26 LAB — POCT GLYCOSYLATED HEMOGLOBIN (HGB A1C): Hemoglobin A1C: 8.8

## 2014-05-27 ENCOUNTER — Ambulatory Visit (INDEPENDENT_AMBULATORY_CARE_PROVIDER_SITE_OTHER): Payer: 59 | Admitting: Internal Medicine

## 2014-05-27 ENCOUNTER — Encounter: Payer: Self-pay | Admitting: Internal Medicine

## 2014-05-27 VITALS — BP 124/78 | HR 94 | Ht 73.0 in | Wt 237.4 lb

## 2014-05-27 DIAGNOSIS — Z4502 Encounter for adjustment and management of automatic implantable cardiac defibrillator: Secondary | ICD-10-CM

## 2014-05-27 DIAGNOSIS — I429 Cardiomyopathy, unspecified: Secondary | ICD-10-CM

## 2014-05-27 DIAGNOSIS — I493 Ventricular premature depolarization: Secondary | ICD-10-CM

## 2014-05-27 DIAGNOSIS — I428 Other cardiomyopathies: Secondary | ICD-10-CM

## 2014-05-27 DIAGNOSIS — I472 Ventricular tachycardia: Secondary | ICD-10-CM

## 2014-05-27 DIAGNOSIS — I5022 Chronic systolic (congestive) heart failure: Secondary | ICD-10-CM

## 2014-05-27 LAB — MDC_IDC_ENUM_SESS_TYPE_INCLINIC
Brady Statistic AP VP Percent: 0.04 %
Brady Statistic AP VS Percent: 40.35 %
Brady Statistic AS VS Percent: 59.58 %
Brady Statistic RA Percent Paced: 40.39 %
Brady Statistic RV Percent Paced: 0.07 %
Date Time Interrogation Session: 20151211100313
HIGH POWER IMPEDANCE MEASURED VALUE: 42 Ohm
HighPow Impedance: 190 Ohm
HighPow Impedance: 285 Ohm
HighPow Impedance: 53 Ohm
Lead Channel Impedance Value: 399 Ohm
Lead Channel Impedance Value: 418 Ohm
Lead Channel Pacing Threshold Amplitude: 0.625 V
Lead Channel Pacing Threshold Pulse Width: 0.4 ms
Lead Channel Sensing Intrinsic Amplitude: 3.375 mV
Lead Channel Setting Pacing Amplitude: 2.5 V
Lead Channel Setting Pacing Pulse Width: 0.4 ms
MDC IDC MSMT BATTERY VOLTAGE: 3.05 V
MDC IDC MSMT LEADCHNL RA PACING THRESHOLD AMPLITUDE: 0.75 V
MDC IDC MSMT LEADCHNL RA PACING THRESHOLD PULSEWIDTH: 0.4 ms
MDC IDC MSMT LEADCHNL RA SENSING INTR AMPL: 1.25 mV
MDC IDC SET LEADCHNL RA PACING AMPLITUDE: 2 V
MDC IDC SET LEADCHNL RV SENSING SENSITIVITY: 0.3 mV
MDC IDC SET ZONE DETECTION INTERVAL: 320 ms
MDC IDC SET ZONE DETECTION INTERVAL: 350 ms
MDC IDC STAT BRADY AS VP PERCENT: 0.03 %
Zone Setting Detection Interval: 360 ms
Zone Setting Detection Interval: 400 ms

## 2014-05-27 MED ORDER — METOLAZONE 2.5 MG PO TABS: ORAL_TABLET | ORAL | Status: DC

## 2014-05-27 MED ORDER — CARVEDILOL 3.125 MG PO TABS: 3.1250 mg | ORAL_TABLET | Freq: Two times a day (BID) | ORAL | Status: DC

## 2014-05-27 NOTE — Progress Notes (Signed)
Patient Care Team: Darlyne Russian, MD as PCP - General (Family Medicine) Darlyne Russian, MD (Family Medicine)   HPI  Paul [name] is a 68 y.o. male Seen in followup for ICD implanted November 2001. He had a history of presyncope and left ventricular dysfunction Ejection fraction was noted to be about 20-30%. 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 6/14 He underwent ablation and mapping Dr Greggory Brandy which was unfortunately unsuccessful, with thoughts perhaps related to epicardial focus   We started him on mexiletine 200 mg twice daily to see if we could impact his PVC burden.  Review of the data has suggested that there has not been a major impact. He still has complaints of shortness of breath with bending; this abated with the discontinuation of his metoprolol but recurred with this resumption.   He has also noted asymmetric edema. This continues to be a problem     . Catheterization November 2011 demonstrated nonischemic disease.   Past Medical History  Diagnosis Date  . Cataract   . Hypertension   . Hypovitaminosis D   . Hypogonadism male   . Ventricular tachycardia     observed during stress testing, subsequent VT on EPS  . 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 Erectile dysfunction   . CHF (congestive heart failure)     "mild" (06/01/2013)  . Type II diabetes mellitus   . GERD (gastroesophageal reflux disease)   . Arthritis     "thumbs, neck, knees" (06/01/2013)    Past Surgical History  Procedure Laterality Date  . Lumbar laminectomy  ~ 2004  . Vasectomy    . Tonsillectomy and adenoidectomy  1972  . Knee arthroscopy Left 1984  . Cardiac defibrillator placement  05/09/2010    MDT ICD implanted in [place] by Dr Ky Barban  . Ventricular ablation  surgery  06/01/2013  . Appendectomy  1969  . Cataract extraction w/ intraocular lens  implant, bilateral Bilateral   . Cardiac catheterization  04/2010  . Ablation  06-01-2013    PVC's ablated along basal inferoseptal RV by Dr Rayann Heman  . V-tach ablation N/A 06/01/2013    Procedure: V-TACH ABLATION;  Surgeon: Coralyn Mark, MD;  Location: [facility] CATH LAB;  Service: Cardiovascular;  Laterality: N/A;    Current Outpatient Prescriptions  Medication Sig Dispense Refill  . aspirin 81 MG tablet Take 81 mg by mouth daily.    . celecoxib (CELEBREX) 200 MG capsule Take 1 capsule (200 mg total) by mouth 2 (two) times daily. 180 capsule 1  . Cholecalciferol (CVS VIT D 5000 HIGH-POTENCY PO) Take 10,000 Units by mouth daily.    . furosemide (LASIX) 40 MG tablet Take 40 mg in the AM and 20 mg in the PM 90 tablet 2  . glimepiride (AMARYL) 4 MG tablet Take 8 mg by mouth daily with breakfast.    . Liraglutide (VICTOZA) 18 MG/3ML SOPN Inject 1.8 mg into the skin daily. 15 pen 1  . magnesium oxide (MAG-OX) 400 MG tablet Take 1 tablet (400 mg total) by mouth daily. 30 tablet 3  . metFORMIN (GLUMETZA) 500 MG (MOD) 24 hr tablet Take 2,000 mg by mouth daily.     Marland Kitchen mexiletine (MEXITIL) 200 MG capsule Take 1 capsule (200 mg total) by mouth 2 (two) times daily.  60 capsule 5  . Sacubitril-Valsartan (ENTRESTO) 49-51 MG TABS Take 49-51 mg by mouth 2 (two) times daily. 60 tablet 3  . spironolactone (ALDACTONE) 25 MG tablet Take 0.5 tablets (12.5 mg total) by mouth daily. 90 tablet 3  . tamsulosin (FLOMAX) 0.4 MG CAPS capsule Take 0.4 mg by mouth daily.     . metoprolol succinate (TOPROL-XL) 25 MG 24 hr tablet Take 1 tablet (25 mg total) by mouth daily. Take with or immediately following a meal. (Patient not taking: Reported on 05/27/2014) 30 tablet 0   No current facility-administered medications for this visit.    Allergies  Allergen Reactions  . Lisinopril     angioedema    Review of Systems negative except from  HPI and PMH  Physical Exam BP 124/78 mmHg  Pulse 94  Ht 6\' 1"  (1.854 m)  Wt 237 lb 6.4 oz (107.684 kg)  BMI 31.33 kg/m2 Well developed and well nourished in no acute distress HENT normal E scleral and icterus clear Neck Supple JVP flat; carotids brisk and full Clear to ausculation Irregular rate and rhythm no murmurs gallops or rub Soft with active bowel sounds No clubbing cyanosis 3+L>R Edema Alert and oriented, grossly normal motor and sensory function Skin Warm and Dry    Assessment and  Plan  PVCs/Ventricular tach-nonsustained   Cardiomyopathy-NICM  Lightheadedness  ICD  CHF chronic Systolic improved  ICD-Medtronic  The patient's device was interrogated.  The information was reviewed. No changes were made in the programming.      we will resume Zaroxolyn a couple of times a week for the edema. He will follow-up with his PCP regarding imaging of his asymmetric edema to exclude DVT and or obstruction  I have been in touch with [facility] and will for his records on to [facility] clinic to think in terms of catheter ablation of his ventricular ectopy. The importance of this is diminished somewhat by the intercurrent significant improvement in his LV function.   We will discontinue his metoprolol as contributing to lightheadedness. We'll try a low-dose carvedilol and see if he can tolerate it and maintain the benefits beta blockers.

## 2014-05-27 NOTE — Patient Instructions (Addendum)
Your physician has recommended you make the following change in your medication:  1) STOP Metroprolol Succinate (you haven't been taking this medication) 2) STOP Mexiletine (Mexitil) 3) START Carvedilol 3.125 mg twice a day 4) START Zaroxolyn 2.5 mg two or three times per week for swelling.   Remote monitoring is used to monitor your Pacemaker of ICD from home. This monitoring reduces the number of office visits required to check your device to one time per year. It allows [place] to keep an eye on the functioning of your device to ensure it is working properly. You are scheduled for a device check from home on 08/29/14. You may send your transmission at any time that day. If you have a wireless device, the transmission will be sent automatically. After your physician reviews your transmission, you will receive a postcard with your next transmission date.  Your physician wants you to follow-up in: 1 year with Dr. Caryl Comes.  You will receive a reminder letter in the mail two months in advance. If you don't receive a letter, please call our office to schedule the follow-up appointment.

## 2014-06-01 ENCOUNTER — Other Ambulatory Visit: Payer: Self-pay | Admitting: *Deleted

## 2014-06-01 ENCOUNTER — Telehealth: Payer: Self-pay

## 2014-06-01 ENCOUNTER — Telehealth: Payer: Self-pay | Admitting: Internal Medicine

## 2014-06-01 ENCOUNTER — Other Ambulatory Visit (INDEPENDENT_AMBULATORY_CARE_PROVIDER_SITE_OTHER): Payer: 59

## 2014-06-01 DIAGNOSIS — R7989 Other specified abnormal findings of blood chemistry: Secondary | ICD-10-CM

## 2014-06-01 DIAGNOSIS — M7989 Other specified soft tissue disorders: Secondary | ICD-10-CM

## 2014-06-01 DIAGNOSIS — R0602 Shortness of breath: Secondary | ICD-10-CM

## 2014-06-01 LAB — D-DIMER, QUANTITATIVE (NOT AT ARMC): D-Dimer, Quant: 1.69 ug/mL-FEU — ABNORMAL HIGH (ref 0.00–0.48)

## 2014-06-01 MED ORDER — ENOXAPARIN SODIUM 100 MG/ML ~~LOC~~ SOLN: 100.0000 mg | Freq: Two times a day (BID) | SUBCUTANEOUS | Status: DC

## 2014-06-01 NOTE — Telephone Encounter (Signed)
Spoke with Dr. Everlene Farrier. Pt's D Dimer came back positive at 1.69. Dr. Everlene Farrier called pt and let him know. I sent in Lovenox for pt to pick up tomorrow morning and put in for a STAT Venous Doppler bilateral r/o clot. Left a not for [place] to help with this in the a.m

## 2014-06-01 NOTE — Telephone Encounter (Signed)
Faxed demographics to requested number

## 2014-06-01 NOTE — Telephone Encounter (Signed)
New Msg    Juliann Pulse calling about a referral Fax that was received for pt but states no pt demographics avail.   Please Fax pt demographics to [phone].  Phn # for office [phone] if needed.

## 2014-06-02 ENCOUNTER — Emergency Department (HOSPITAL_COMMUNITY): Payer: 59

## 2014-06-02 ENCOUNTER — Telehealth: Payer: Self-pay | Admitting: Emergency Medicine

## 2014-06-02 ENCOUNTER — Encounter (HOSPITAL_COMMUNITY): Payer: Self-pay | Admitting: *Deleted

## 2014-06-02 ENCOUNTER — Other Ambulatory Visit: Payer: Self-pay | Admitting: Emergency Medicine

## 2014-06-02 ENCOUNTER — Emergency Department (HOSPITAL_COMMUNITY)
Admission: EM | Admit: 2014-06-02 | Discharge: 2014-06-02 | Disposition: A | Discharge status: Home / Self Care | Payer: 59 | Attending: Emergency Medicine | Admitting: Emergency Medicine

## 2014-06-02 DIAGNOSIS — I509 Heart failure, unspecified: Secondary | ICD-10-CM | POA: Insufficient documentation

## 2014-06-02 DIAGNOSIS — R609 Edema, unspecified: Secondary | ICD-10-CM

## 2014-06-02 DIAGNOSIS — R0602 Shortness of breath: Secondary | ICD-10-CM | POA: Insufficient documentation

## 2014-06-02 DIAGNOSIS — E119 Type 2 diabetes mellitus without complications: Secondary | ICD-10-CM | POA: Insufficient documentation

## 2014-06-02 DIAGNOSIS — M7989 Other specified soft tissue disorders: Secondary | ICD-10-CM | POA: Insufficient documentation

## 2014-06-02 DIAGNOSIS — I82402 Acute embolism and thrombosis of unspecified deep veins of left lower extremity: Secondary | ICD-10-CM | POA: Insufficient documentation

## 2014-06-02 DIAGNOSIS — I10 Essential (primary) hypertension: Secondary | ICD-10-CM | POA: Diagnosis not present

## 2014-06-02 DIAGNOSIS — R000 Tachycardia, unspecified: Secondary | ICD-10-CM | POA: Insufficient documentation

## 2014-06-02 DIAGNOSIS — Z8739 Personal history of other diseases of the musculoskeletal system and connective tissue: Secondary | ICD-10-CM | POA: Diagnosis not present

## 2014-06-02 DIAGNOSIS — Z7982 Long term (current) use of aspirin: Secondary | ICD-10-CM | POA: Insufficient documentation

## 2014-06-02 DIAGNOSIS — Z79899 Other long term (current) drug therapy: Secondary | ICD-10-CM | POA: Insufficient documentation

## 2014-06-02 DIAGNOSIS — Z8719 Personal history of other diseases of the digestive system: Secondary | ICD-10-CM | POA: Insufficient documentation

## 2014-06-02 LAB — CBC WITH DIFFERENTIAL/PLATELET
BASOS PCT: 1 % (ref 0–1)
Basophils Absolute: 0.1 10*3/uL (ref 0.0–0.1)
Eosinophils Absolute: 0.3 10*3/uL (ref 0.0–0.7)
Eosinophils Relative: 4 % (ref 0–5)
HCT: 39.1 % (ref 39.0–52.0)
HEMOGLOBIN: 13 g/dL (ref 13.0–17.0)
Lymphocytes Relative: 23 % (ref 12–46)
Lymphs Abs: 1.6 10*3/uL (ref 0.7–4.0)
MCH: 30.6 pg (ref 26.0–34.0)
MCHC: 33.2 g/dL (ref 30.0–36.0)
MCV: 92 fL (ref 78.0–100.0)
MONO ABS: 0.3 10*3/uL (ref 0.1–1.0)
Monocytes Relative: 4 % (ref 3–12)
NEUTROS PCT: 68 % (ref 43–77)
Neutro Abs: 4.7 10*3/uL (ref 1.7–7.7)
Platelets: 191 10*3/uL (ref 150–400)
RBC: 4.25 MIL/uL (ref 4.22–5.81)
RDW: 13 % (ref 11.5–15.5)
WBC: 6.9 10*3/uL (ref 4.0–10.5)

## 2014-06-02 LAB — BASIC METABOLIC PANEL
Anion gap: 16 — ABNORMAL HIGH (ref 5–15)
BUN: 33 mg/dL — AB (ref 6–23)
CHLORIDE: 95 meq/L — AB (ref 96–112)
CO2: 25 meq/L (ref 19–32)
Calcium: 8.7 mg/dL (ref 8.4–10.5)
Creatinine, Ser: 1.23 mg/dL (ref 0.50–1.35)
GFR calc non Af Amer: 59 mL/min — ABNORMAL LOW (ref >90)
GFR, EST AFRICAN AMERICAN: 69 mL/min — AB (ref >90)
Glucose, Bld: 338 mg/dL — ABNORMAL HIGH (ref 70–99)
POTASSIUM: 4.8 meq/L (ref 3.7–5.3)
Sodium: 136 mEq/L — ABNORMAL LOW (ref 137–147)

## 2014-06-02 LAB — TROPONIN I: Troponin I: <0.3 ng/mL (ref <0.30)

## 2014-06-02 MED ORDER — SODIUM CHLORIDE 0.9 % IV BOLUS (SEPSIS)
500.0000 mL | Freq: Once | INTRAVENOUS | Status: AC
  Administered 2014-06-02: 500 mL via INTRAVENOUS

## 2014-06-02 MED ORDER — XARELTO VTE STARTER PACK 15 & 20 MG PO TBPK: 15.0000 mg | ORAL_TABLET | ORAL | Status: DC

## 2014-06-02 MED ORDER — IOHEXOL 350 MG/ML SOLN
100.0000 mL | Freq: Once | INTRAVENOUS | Status: AC | PRN
  Administered 2014-06-02: 100 mL via INTRAVENOUS

## 2014-06-02 NOTE — ED Provider Notes (Signed)
CSN: [id number]     Arrival date & time 06/02/14  0806 History   First MD Initiated Contact with Patient 06/02/14 [phone]     No chief complaint on file.  (Consider location/radiation/quality/duration/timing/severity/associated sxs/prior Treatment) HPI  Paul [name] is a 68 yo male presenting to the ED with report of unilateral leg swelling x 1 month.  He reports his cardiologist has been following him for peripheral edema bilaterally for a while but in the last month he has noticed more significant swelling the his left foot and calf.  He also reports in the last month he noted some increased shortness of breath with exertion.  A d-dimer was taken at his PCP yesterday and was called with the result of the d-dimer being elevated. He denies any recent travel, being immobilized, recent surgery, chest pain, nausea, diaphoresis, hemoptysis or calf pain at anytime.      Past Medical History  Diagnosis Date  . Cataract   . Hypertension   . Hypovitaminosis D   . Hypogonadism male   . Ventricular tachycardia     observed during stress testing, subsequent VT on EPS  . 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 Erectile dysfunction   . CHF (congestive heart failure)     "mild" (06/01/2013)  . Type II diabetes mellitus   . GERD (gastroesophageal reflux disease)   . Arthritis     "thumbs, neck, knees" (06/01/2013)   Past Surgical History  Procedure Laterality Date  . Lumbar laminectomy  ~ 2004  . Vasectomy    . Tonsillectomy and adenoidectomy  1972  . Knee arthroscopy Left 1984  . Cardiac defibrillator placement  05/09/2010    MDT ICD implanted in [place] by Dr Ky Barban  . Ventricular ablation surgery  06/01/2013  . Appendectomy  1969  . Cataract extraction w/ intraocular lens  implant, bilateral Bilateral   . Cardiac catheterization  04/2010  . Ablation  06-01-2013    PVC's ablated along basal inferoseptal RV by Dr Rayann Heman  . V-tach ablation N/A 06/01/2013    Procedure: V-TACH  ABLATION;  Surgeon: Coralyn Mark, MD;  Location: [facility] CATH LAB;  Service: Cardiovascular;  Laterality: N/A;   Family History  Problem Relation Age of Onset  . Alcohol abuse Mother   . COPD Mother   . Depression Mother   . Heart disease Mother   . Hypertension Mother   . Stroke Mother   . COPD Father   . Heart disease Father   . Hypertension Father   . Stroke Father   . Cancer Father   . Diabetes Sister   . Mental retardation Sister   . Learning disabilities Sister   . Hypertension Sister   . Alcohol abuse Brother   . Drug abuse Brother   . Heart disease Brother   . Hypertension Brother   . Coronary artery disease Brother    History  Substance Use Topics  . Smoking status: Never Smoker   . Smokeless tobacco: Never Used  . Alcohol Use: 1.2 oz/week    1 Glasses of wine, 1 Cans of beer per week    Review of Systems  Constitutional: Negative for fever and chills.  HENT: Negative for sore throat.   Eyes: Negative for visual disturbance.  Respiratory: Positive for shortness of breath. Negative for cough.   Cardiovascular: Positive for leg swelling. Negative for chest pain.  Gastrointestinal: Negative for nausea, vomiting and diarrhea.  Genitourinary: Negative for dysuria.  Musculoskeletal: Negative for myalgias.  Skin: Negative  for rash.  Neurological: Negative for weakness, numbness and headaches.    Allergies  Lisinopril  Home Medications   Prior to Admission medications   Medication Sig Start Date End Date Taking? Authorizing Provider  aspirin 81 MG tablet Take 81 mg by mouth daily.    Historical Provider, MD  carvedilol (COREG) 3.125 MG tablet Take 1 tablet (3.125 mg total) by mouth 2 (two) times daily. 05/27/14   Deboraha Sprang, MD  celecoxib (CELEBREX) 200 MG capsule Take 1 capsule (200 mg total) by mouth 2 (two) times daily. 12/01/13   Darlyne Russian, MD  Cholecalciferol (CVS VIT D 5000 HIGH-POTENCY PO) Take 10,000 Units by mouth daily.    Historical Provider, MD   enoxaparin (LOVENOX) 100 MG/ML injection Inject 1 mL (100 mg total) into the skin every 12 (twelve) hours. 06/01/14   Darlyne Russian, MD  furosemide (LASIX) 40 MG tablet Take 40 mg in the AM and 20 mg in the PM 05/03/14   Larey Dresser, MD  glimepiride (AMARYL) 4 MG tablet Take 8 mg by mouth daily with breakfast.    Historical Provider, MD  Liraglutide (VICTOZA) 18 MG/3ML SOPN Inject 1.8 mg into the skin daily. 12/01/13   Darlyne Russian, MD  magnesium oxide (MAG-OX) 400 MG tablet Take 1 tablet (400 mg total) by mouth daily. 10/12/13   Deboraha Sprang, MD  metFORMIN (GLUMETZA) 500 MG (MOD) 24 hr tablet Take 2,000 mg by mouth daily.  11/24/12   Darlyne Russian, MD  metolazone (ZAROXOLYN) 2.5 MG tablet Take 1 tablet (2.5 mg total) by mouth 2 or 3 times per week for swelling. 05/27/14   Deboraha Sprang, MD  Sacubitril-Valsartan (ENTRESTO) 49-51 MG TABS Take 49-51 mg by mouth 2 (two) times daily. 05/03/14   Larey Dresser, MD  spironolactone (ALDACTONE) 25 MG tablet Take 0.5 tablets (12.5 mg total) by mouth daily. 05/23/14   Deboraha Sprang, MD  tamsulosin (FLOMAX) 0.4 MG CAPS capsule Take 0.4 mg by mouth daily.     Historical Provider, MD   There were no vitals taken for this visit. Physical Exam  Constitutional: He appears well-developed and well-nourished. No distress.  HENT:  Head: Normocephalic and atraumatic.  Mouth/Throat: Oropharynx is clear and moist. No oropharyngeal exudate.  Eyes: Conjunctivae are normal.  Neck: Neck supple. No thyromegaly present.  Cardiovascular: Normal rate and intact distal pulses.  An irregular rhythm present. Exam reveals no gallop and no friction rub.   No murmur heard. Pulses:      Dorsalis pedis pulses are 2+ on the right side, and 2+ on the left side.       Posterior tibial pulses are 2+ on the right side, and 2+ on the left side.  1-2+ edema in rt lower extremity, 3+ edema in left lower extremity   Pulmonary/Chest: Effort normal and breath sounds normal. No  respiratory distress. He has no wheezes. He has no rales. He exhibits no tenderness.  Abdominal: Soft. There is no tenderness.  Musculoskeletal: He exhibits no tenderness.  Lymphadenopathy:    He has no cervical adenopathy.  Neurological: He is alert.  Skin: Skin is warm and dry. No rash noted. He is not diaphoretic.  Psychiatric: He has a normal mood and affect.  Nursing note and vitals reviewed.   ED Course  Procedures (including critical care time) Labs Review Labs Reviewed  BASIC METABOLIC PANEL - Abnormal; Notable for the following:    Sodium 136 (*)  Chloride 95 (*)    Glucose, Bld 338 (*)    BUN 33 (*)    GFR calc non Af Amer 59 (*)    GFR calc Af Amer 69 (*)    Anion gap 16 (*)    All other components within normal limits  CBC WITH DIFFERENTIAL  TROPONIN I    Imaging Review Dg Chest 2 View  06/02/2014   CLINICAL DATA:  One month history of cough and congestion ; difficulty breathing  EXAM: CHEST  2 VIEW  COMPARISON:  September 19, 2013  FINDINGS: There is no edema or consolidation. The heart size and pulmonary vascularity are normal. Pacemaker lead tips are attached to the right atrium and right ventricle. No adenopathy. No pneumothorax. There is mild degenerative change in the thoracic spine.  IMPRESSION: No edema or consolidation.   Electronically Signed   By: Lowella Grip M.D.   On: 06/02/2014 09:10   Ct Angio Chest Pe W/cm &/or Wo Cm  06/02/2014   CLINICAL DATA:  Narrowing DVT, shortness of breath for several weeks, question pulmonary embolism, history hypertension, dilated non ischemic cardiomyopathy, type 2 diabetes  EXAM: CT ANGIOGRAPHY CHEST WITH CONTRAST  TECHNIQUE: Multidetector CT imaging of the chest was performed using the standard protocol during bolus administration of intravenous contrast. Multiplanar CT image reconstructions and MIPs were obtained to evaluate the vascular anatomy.  CONTRAST:  141mL OMNIPAQUE IOHEXOL 350 MG/ML SOLN  COMPARISON:  None   FINDINGS: Mild aneurysmal dilatation of the ascending thoracic aorta 4.1 x 4.0 cm image 45.  Minimal atherosclerotic calcification aorta without dissection.  Enlargement of cardiac chambers with AICD leads in RIGHT atrium and RIGHT ventricle.  Pulmonary arteries well opacified.  Single tiny filling defect identified in a RIGHT upper lobe pulmonary artery, appears linear on coronal images, favor artifact.  No definite pulmonary emboli identified.  No thoracic adenopathy.  Scattered streak artifacts from dense contrast within SVC.  BILATERAL gynecomastia.  Lungs clear.  No pleural effusion or pneumothorax.  Bones unremarkable.  Review of the MIP images confirms the above findings.  IMPRESSION: No definite evidence of pulmonary embolism.  Ectatic to mildly aneurysmal ascending thoracic aorta. Recommend annual imaging followup by CTA or MRA. This recommendation follows 2010 ACCF/AHA/AATS/ACR/ASA/SCA/SCAI/SIR/STS/SVM Guidelines for the Diagnosis and Management of Patients With Thoracic Aortic Disease. Circulation.2010; 121: U272-Z366   Electronically Signed   By: Lavonia Dana M.D.   On: 06/02/2014 13:42     EKG Interpretation   Date/Time:  Thursday June 02 2014 08:16:17 EST Ventricular Rate:  105 PR Interval:  143 QRS Duration: 155 QT Interval:  415 QTC Calculation: 548 R Axis:   -65 Text Interpretation:  Sinus tachycardia Ventricular tachycardia,  unsustained Right bundle branch block Probable inferior infarct, age  indeterminate frequent PVC's Abnormal ekg since last tracing no  significant change Confirmed by MILLER  MD, BRIAN ([id number]) on 06/02/2014  8:25:41 AM      MDM   Final diagnoses:  Shortness of breath  DVT (deep venous thrombosis), left   68 yo male with left lower extremity swelling, and elevated d-dimer.  Discussed case with Dr. Sabra Heck.   Consider dvt vs PE vs acute coronary syndrome or peripheral edema. Pt is well-appearing on initial exam, alert, no acute distress and no  complaints other than unilateral leg swelling. CBC, BMP, Troponin, CXR and Venous doppler left lower extremity.  Labs resulted:  Significant findings include glucose: 338 and BUN: 33.   Venous doppler is positive for 2 DVTs.  553ml NS bolus given to allow for CT with contrast. CT angio chest negative for PE.  Pt updated with results.   Pt is well-appearing, in no acute distress and vital signs are stable.  They appear safe to be discharged.  Discharge include prescription to begin Xarelto and follow-up with their PCP.  Return precautions provided.  Pt aware of plan and in agreement.    Filed Vitals:   06/02/14 1300 06/02/14 1337 06/02/14 1354 06/02/14 1417  BP: 137/69 136/69 122/62   Pulse: 50 56 63   Temp:   99.4 F (37.4 C) 97.4 F (36.3 C)  TempSrc:   Oral Oral  Resp: 14 16 13    SpO2: 97% 100% 99%    Meds given in ED:  Medications  sodium chloride 0.9 % bolus 500 mL (0 mLs Intravenous Stopped 06/02/14 1417)  iohexol (OMNIPAQUE) 350 MG/ML injection 100 mL (100 mLs Intravenous Contrast Given 06/02/14 1233)    Discharge Medication List as of 06/02/2014  1:59 PM    START taking these medications   Details  XARELTO STARTER PACK 15 & 20 MG TBPK Take 15-20 mg by mouth as directed. Take as directed on package: Start with one 15mg  tablet by mouth twice a day with food. On Day 22, switch to one 20mg  tablet once a day with food., Starting 06/02/2014, Until Discontinued, Print           Britt Bottom, NP 06/03/14 1908  Johnna Acosta, MD 06/04/14 [id number]

## 2014-06-02 NOTE — Progress Notes (Signed)
VASCULAR LAB PRELIMINARY  PRELIMINARY  PRELIMINARY  PRELIMINARY  Bilateral lower extremity venous Dopplers completed.    Preliminary report:  There is acute DVT noted in the left posterior tibial and popliteal veins.  All other veins appear thrombus free.  Deon Ivey, RVT 06/02/2014, 9:55 AM

## 2014-06-02 NOTE — ED Provider Notes (Signed)
The patient is a 68 year old male with a history of congestive heart failure, he is well controlled on medications, last ejection fraction was approximately 55% though he does report a history of sustained ventricular tachycardia during a stress test for which an implantable cardio defibrillator was placed. He has never had to have a defibrillation. He states that his legs have chronic swelling, he is on Lasix and spironolactone, he has noted some asymmetry in his legs left greater than right and a d-dimer came back positive yesterday at the urgent care. He was sent for an ultrasound of his lower extremities. He does report some dyspnea on exertion but states this is mostly when he bends over to tie his shoes or to deal with his new puppy. It is less with exertional cardiac activity. He states this is somewhat of a subacute to chronic problem.  On exam the patient has asymmetry of his lower extremities, left greater than right, pitting edema on the left lower extremity, mild edema on the right lower extremity. There is normal heart exam, normal lung exam, he does have frequent ectopy but no murmurs, no rales, he appears very comfortable.  CT neg for acute PE, has DVT - can be treated [place] screening examination/treatment/procedure(s) were conducted as a shared visit with non-physician practitioner(s) and myself.  I personally evaluated the patient during the encounter.  Clinical Impression:   Final diagnoses:  Shortness of breath  DVT (deep venous thrombosis), left         Johnna Acosta, MD 06/02/14 2141

## 2014-06-02 NOTE — ED Notes (Signed)
Patient to xray.

## 2014-06-02 NOTE — Telephone Encounter (Signed)
Talked to patient at 10 pm. Unable to get lovenox tonight.D dimer 1.69. Needs eval for dvt and pe.Will discuss with Dr. Caryl Comes and get plan for eval..Cr. Is high. ?? V:Q scan with dopplers.

## 2014-06-02 NOTE — Discharge Instructions (Signed)
Please follow the directions provided.  Please start the Xarelto as directed for treatment of the dvt in your left leg.  Don't hesitate to return for any new, worsening or concerning symptoms.     SEEK IMMEDIATE MEDICAL CARE IF:  You have chest pain.  You have trouble breathing.  You have new or increased swelling or pain in one leg.  You cough up blood.  You notice blood in vomit, in a bowel movement, or in urine.   IMPORTANT SAFETY INFORMATION  Xarelto is a blood thinner medicine that can cause bleeding. You should call your healthcare provider right away if you experience any of the following:  - Bleeding from an injury or your nose that does not stop.  - Unusual colored urine (red or dark brown) or unusual colored stools (red or black).  - Unusual bruising for unknown reasons.  - A serious fall or if you hit your head (even if there is no bleeding).

## 2014-06-02 NOTE — Telephone Encounter (Signed)
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 I believe the safer protocol would be to have him go to the hospital at [facility] and have venous Dopplers done as well as as a VQ scan to rule out PE. I placed calls to Dr. Caryl Comes and also to the cardiologist on-call for [place]. I also called triage at the ER

## 2014-06-02 NOTE — ED Notes (Signed)
Dr Miller at bedside. 

## 2014-06-02 NOTE — ED Notes (Signed)
Pt states L LE swelling x 1 month.  Pt also c/o recent sob.  D-dimer at work yesterday was elevated.

## 2014-06-20 ENCOUNTER — Telehealth: Payer: Self-pay | Admitting: Physician Assistant

## 2014-06-20 ENCOUNTER — Other Ambulatory Visit: Payer: Self-pay | Admitting: Emergency Medicine

## 2014-06-20 DIAGNOSIS — I5022 Chronic systolic (congestive) heart failure: Secondary | ICD-10-CM

## 2014-06-20 NOTE — Telephone Encounter (Signed)
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

## 2014-06-20 NOTE — Telephone Encounter (Signed)
Find the records in CHART REVIEW, MEDIA tab.

## 2014-06-21 ENCOUNTER — Other Ambulatory Visit: Payer: Self-pay | Admitting: *Deleted

## 2014-06-21 ENCOUNTER — Other Ambulatory Visit: Payer: Self-pay | Admitting: Radiology

## 2014-06-21 DIAGNOSIS — I5022 Chronic systolic (congestive) heart failure: Secondary | ICD-10-CM

## 2014-06-21 MED ORDER — LANCETS MISC: Status: DC

## 2014-06-21 MED ORDER — GLUCOSE BLOOD VI STRP: ORAL_STRIP | Status: DC

## 2014-06-21 MED ORDER — BLOOD GLUCOSE MONITOR SYSTEM W/DEVICE KIT: PACK | Status: DC

## 2014-06-21 MED ORDER — INSULIN PEN NEEDLE 31G X 6 MM MISC: Status: DC

## 2014-06-22 ENCOUNTER — Ambulatory Visit (HOSPITAL_COMMUNITY): Payer: 59 | Attending: Cardiovascular Disease | Admitting: Cardiology

## 2014-06-22 DIAGNOSIS — I5022 Chronic systolic (congestive) heart failure: Secondary | ICD-10-CM

## 2014-06-22 NOTE — Progress Notes (Signed)
Echo performed. 

## 2014-06-30 ENCOUNTER — Other Ambulatory Visit: Payer: Self-pay | Admitting: Family Medicine

## 2014-06-30 DIAGNOSIS — I82402 Acute embolism and thrombosis of unspecified deep veins of left lower extremity: Secondary | ICD-10-CM

## 2014-06-30 MED ORDER — RIVAROXABAN 20 MG PO TABS: 20.0000 mg | ORAL_TABLET | Freq: Every day | ORAL | Status: DC

## 2014-06-30 NOTE — Telephone Encounter (Signed)
Patient request refill on Xarelto

## 2014-07-01 ENCOUNTER — Other Ambulatory Visit: Payer: Self-pay | Admitting: Emergency Medicine

## 2014-07-01 ENCOUNTER — Telehealth: Payer: Self-pay

## 2014-07-01 DIAGNOSIS — I82402 Acute embolism and thrombosis of unspecified deep veins of left lower extremity: Secondary | ICD-10-CM

## 2014-07-01 MED ORDER — RIVAROXABAN 20 MG PO TABS: 20.0000 mg | ORAL_TABLET | Freq: Every day | ORAL | Status: DC

## 2014-07-01 NOTE — Telephone Encounter (Signed)
Pt needs a RF of Xarelto. 6 months given per Dr. Everlene Farrier

## 2014-07-18 ENCOUNTER — Other Ambulatory Visit: Payer: Self-pay | Admitting: Emergency Medicine

## 2014-07-18 ENCOUNTER — Telehealth: Payer: Self-pay | Admitting: Emergency Medicine

## 2014-07-18 MED ORDER — CELECOXIB 200 MG PO CAPS: ORAL_CAPSULE | ORAL | Status: DC

## 2014-07-18 NOTE — Telephone Encounter (Signed)
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 He requests a refill of his Celebrex. He had been on 1 capsule twice a day. He understands he is on a blood thinner and needs to be very careful with this medication. I did refill his Celebrex to take one a day and advised him to take the Prilosec one a day. He states Tylenol and it is not helping at all and he is unable to function with use of his hands without the Celebrex. I did agree to refill the medication and patient does understand risks.

## 2014-07-22 ENCOUNTER — Other Ambulatory Visit (HOSPITAL_COMMUNITY): Payer: Self-pay

## 2014-07-22 ENCOUNTER — Ambulatory Visit (HOSPITAL_COMMUNITY)
Admission: RE | Admit: 2014-07-22 | Discharge: 2014-07-22 | Disposition: A | Discharge status: Home / Self Care | Payer: 59 | Source: Ambulatory Visit | Attending: Adult Health | Admitting: Adult Health

## 2014-07-22 ENCOUNTER — Encounter (HOSPITAL_COMMUNITY): Payer: Self-pay

## 2014-07-22 VITALS — BP 134/63 | HR 72 | Resp 18 | Wt 236.8 lb

## 2014-07-22 DIAGNOSIS — I493 Ventricular premature depolarization: Secondary | ICD-10-CM | POA: Insufficient documentation

## 2014-07-22 DIAGNOSIS — I472 Ventricular tachycardia: Secondary | ICD-10-CM

## 2014-07-22 DIAGNOSIS — I4729 Other ventricular tachycardia: Secondary | ICD-10-CM

## 2014-07-22 DIAGNOSIS — N189 Chronic kidney disease, unspecified: Secondary | ICD-10-CM | POA: Insufficient documentation

## 2014-07-22 DIAGNOSIS — I2510 Atherosclerotic heart disease of native coronary artery without angina pectoris: Secondary | ICD-10-CM | POA: Insufficient documentation

## 2014-07-22 DIAGNOSIS — R0602 Shortness of breath: Secondary | ICD-10-CM

## 2014-07-22 DIAGNOSIS — I82409 Acute embolism and thrombosis of unspecified deep veins of unspecified lower extremity: Secondary | ICD-10-CM | POA: Insufficient documentation

## 2014-07-22 DIAGNOSIS — I5022 Chronic systolic (congestive) heart failure: Secondary | ICD-10-CM

## 2014-07-22 DIAGNOSIS — Z7901 Long term (current) use of anticoagulants: Secondary | ICD-10-CM | POA: Insufficient documentation

## 2014-07-22 LAB — BASIC METABOLIC PANEL
ANION GAP: 7 (ref 5–15)
BUN: 23 mg/dL (ref 6–23)
CHLORIDE: 99 mmol/L (ref 96–112)
CO2: 30 mmol/L (ref 19–32)
CREATININE: 1.42 mg/dL — AB (ref 0.50–1.35)
Calcium: 9.2 mg/dL (ref 8.4–10.5)
GFR calc Af Amer: 58 mL/min — ABNORMAL LOW (ref >90)
GFR, EST NON AFRICAN AMERICAN: 50 mL/min — AB (ref >90)
GLUCOSE: 266 mg/dL — AB (ref 70–99)
Potassium: 4.5 mmol/L (ref 3.5–5.1)
SODIUM: 136 mmol/L (ref 135–145)

## 2014-07-22 LAB — BRAIN NATRIURETIC PEPTIDE: B Natriuretic Peptide: 57.9 pg/mL (ref 0.0–100.0)

## 2014-07-22 MED ORDER — PRAVASTATIN SODIUM 20 MG PO TABS: 20.0000 mg | ORAL_TABLET | Freq: Every evening | ORAL | Status: DC

## 2014-07-22 MED ORDER — CARVEDILOL 6.25 MG PO TABS: 6.2500 mg | ORAL_TABLET | Freq: Two times a day (BID) | ORAL | Status: DC

## 2014-07-22 NOTE — Patient Instructions (Signed)
START Pravastatin 20mg  tablet once daily.  INCREASE Carvedilol to 6.25mg  tablet twice daily.  Return in 2 months for appointment with lab work.  Do the following things EVERYDAY: 1) Weigh yourself in the morning before breakfast. Write it down and keep it in a log. 2) Take your medicines as prescribed 3) Eat low salt foods-Limit salt (sodium) to 2000 mg per day.  4) Stay as active as you can everyday 5) Limit all fluids for the day to less than 2 liters

## 2014-07-24 NOTE — Progress Notes (Signed)
Patient ID: Paul [name], male   DOB: 19-Sep-1945, 69 y.o.   MRN: [id number] PCP: Dr. Everlene Farrier Referring MD: Dr. 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 Patient has had a cardiomyopathy known since around 2011.  Cath at that time showed nonobstructive CAD but EF 25%.  He got a Medtronic ICD.  Initial workup was in [place], and he subsequently moved to [place].  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    He has been noted to have frequent PVCs, at one point up to 1/3 of beats.  VT ablation did not help much, but mexiletine seemed to cut back on PVC frequency.  When he last seen by Dr. Caryl Comes, Mexiletine was stopped. Dr Ouida Sills felt like his PVCs increased again.  He was seen at [facility] for possible VT ablation.  During that visit, his ICD was interrogated and PVCs were noted to be up to > 1000/hr with short runs NSVT.  Echo was repeated, and showed EF down to 30-35%.  He also had a holter monitor done at [facility] but I am unable to find the result and he has not been called about it. Also of note, patient had an untriggered DVT in 12/15 and is now on Xarelto. He was diagnosed with OSA and is awaiting CPAP titration.   Patient reports that his energy level is lower off mexiletine.  However, no dyspnea walking on flat ground.  Mild dyspnea walking up steps carrying something or walking up a steep hill.  No chest pain.  Optivol showed fluid index above threshold with thoracic impedance trending up.    Labs (10/15): K 4.7, creatinine 1.6, LDL 84, HDL 35 Labs (12/15): K 4.8, creatinine 1.23  PMH: 1. Nonischemic cardiomyopathy: Diagnosed in [place] in 2011.  Possibly related to frequent PVCs.  LHC (1/11) with 40-50% mLAD stenosis, 90% small OM1 stenosis, EF 25%.  Medtronic ICD placed in 2011.  Echo (6/14) with EF 35%.  Echo (10/15) with EF 40-45% (difficult with PVCs), mildly dilated RV with moderately decreased RV  systolic function.  Echo (1/16) with EF 30-35%, mildly dilated LV.   2. PVCs: Frequent, at one point up to 1/3 of total beats.  Patient had endocardial ablation in 6/14 that was not successful.  However, PVC count has dropped with initiation of mexiletine.   3. HTN 4. Type II diabetes 5. CAD: See above cath, nonobstructive disease on 1/11 LHC.  6. GERD 7. Appendectomy 8. Lumbar laminectomy 9. CKD 10. Hyperlipidemia: Myalgias with Crestor.  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  12. 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  FH: Father with MI in his 70s and CVA (smoker), mother with PAD diagnosed in her 31s.   ROS: All systems reviewed and negative except as per HPI.   Current Outpatient Prescriptions  Medication Sig Dispense Refill  . Blood Glucose Monitoring Suppl (BLOOD GLUCOSE MONITOR SYSTEM) W/DEVICE KIT Glucose monitor to check blood sugars tid dx code E11.9 brand per insurance coverage 1 each 0  . carvedilol (COREG) 6.25 MG tablet Take 1 tablet (6.25 mg total) by mouth 2 (two) times daily. 60 tablet 3  . celecoxib (CELEBREX) 200 MG capsule Take 1 tablet daily 90 capsule 1  . Cholecalciferol (CVS VIT D 5000 HIGH-POTENCY PO) Take 10,000 Units by mouth daily.    . furosemide (LASIX) 40 MG tablet Take 40 mg in the AM and 20 mg  in the PM 90 tablet 2  . glimepiride (AMARYL) 4 MG tablet Take 8 mg by mouth daily with breakfast.    . glucose blood test strip Use  to check blood sugars tid dx code E11.9 brand per insurance coverage 100 each 12  . Insulin Pen Needle (CLICKFINE PEN NEEDLES) 31G X 6 MM MISC For tid injections 100 each 11  . Lancets MISC Glucose monitor to check blood sugars tid dx code E11.9 brand per insurance coverage 100 each 11  . Liraglutide (VICTOZA) 18 MG/3ML SOPN Inject 1.8 mg into the skin daily. 15 pen 1  . magnesium oxide (MAG-OX) 400 MG tablet Take 1 tablet (400 mg total) by mouth daily. 30 tablet 3  . metFORMIN (GLUCOPHAGE-XR)  500 MG 24 hr tablet TAKE 1 TABLET BY MOUTH 4 TIMES DAILY 360 tablet 1  . metolazone (ZAROXOLYN) 2.5 MG tablet Take 1 tablet (2.5 mg total) by mouth 2 or 3 times per week for swelling. 12 tablet 6  . pravastatin (PRAVACHOL) 20 MG tablet Take 1 tablet (20 mg total) by mouth every evening. 90 tablet 3  . rivaroxaban (XARELTO) 20 MG TABS tablet Take 1 tablet (20 mg total) by mouth daily with supper. 90 tablet 1  . Sacubitril-Valsartan (ENTRESTO) 49-51 MG TABS Take 49-51 mg by mouth 2 (two) times daily. 60 tablet 3  . spironolactone (ALDACTONE) 25 MG tablet Take 0.5 tablets (12.5 mg total) by mouth daily. (Patient taking differently: Take 25 mg by mouth daily. ) 90 tablet 3  . tamsulosin (FLOMAX) 0.4 MG CAPS capsule Take 0.4 mg by mouth daily.      No current facility-administered medications for this encounter.   BP 134/63 mmHg  Pulse 72  Resp 18  Wt 236 lb 12 oz (107.389 kg)  SpO2 95% General: NAD Neck: JVP 7 cm, no thyromegaly or thyroid nodule.  Lungs: Clear to auscultation bilaterally with normal respiratory effort. CV: Nondisplaced PMI.  Heart regular S1/S2, no S3/S4, no murmur.  1+ ankle edema.  No carotid bruit.  Normal pedal pulses.  Abdomen: Soft, nontender, no hepatosplenomegaly, no distention.  Skin: Intact without lesions or rashes.  Neurologic: Alert and oriented x 3.  Psych: Normal affect. Extremities: No clubbing or cyanosis.  HEENT: Normal.   Assessment/Plan:  1. Chronic systolic CHF: Nonischemic cardiomyopathy. Possibly PVC-related as there seemed to have been some improvement in EF with decreasing PVC burdern on mexiletine (EF 40-45%).  With more PVCs off mexiletine, EF down to 30-35%.  NYHA II symptoms, he is not volume overloaded.    - Continue current Laisx. - Increase Coreg to 6.25 mg bid.  - Continue spironolactone 25 mg daily.  - Continue current Entresto.  - BMET/BNP.   2. 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  I would have liked to do a cardiac MRI to look  for ARVC (abnormal RV with moderate hypokinesis) and assess for late gadolinium enhancement in the LV.  However, patient has ICD so cannot do MRI.  He is having more PVCs off mexiletine and EF is down.   - As above, increase Coreg.  - Waiting to hear from [place] about the next step: PVC ablation versus restart an anti-arrhythmic.  He had a holter monitor done but has not heard the result yet.   - Treating OSA will likely decrease PVCs.  He is set up for a CPAP titration.  3. CKD: Will need to follow renal function closely with medication adjustments.  BMET today.  4. CAD: Nonobstructive CAD on prior  cath.  He is on Xarelto so not on ASA.  He should be on a statin.  Myalgias with Crestor in the past.  Will have him try pravastatin 20 mg daily with lipids/LFTs in 2 months.  5. DVT: Spontaneous (no trigger).  He will be on Xarelto long-term.   Followup in 2 months.    Loralie Champagne 07/24/2014

## 2014-08-04 ENCOUNTER — Telehealth: Payer: Self-pay | Admitting: Radiology

## 2014-08-04 ENCOUNTER — Encounter: Payer: Self-pay | Admitting: Radiology

## 2014-08-04 NOTE — Telephone Encounter (Signed)
Copy of 2D echo sent to Dr. Beckey Downing office.

## 2014-08-08 ENCOUNTER — Other Ambulatory Visit (HOSPITAL_COMMUNITY): Payer: Self-pay | Admitting: Cardiology

## 2014-08-08 MED ORDER — CARVEDILOL 6.25 MG PO TABS: 6.2500 mg | ORAL_TABLET | Freq: Two times a day (BID) | ORAL | Status: DC

## 2014-08-15 ENCOUNTER — Encounter: Payer: Self-pay | Admitting: Internal Medicine

## 2014-08-29 ENCOUNTER — Encounter: Payer: Self-pay | Admitting: Internal Medicine

## 2014-08-29 ENCOUNTER — Ambulatory Visit (INDEPENDENT_AMBULATORY_CARE_PROVIDER_SITE_OTHER): Payer: 59 | Admitting: *Deleted

## 2014-08-29 DIAGNOSIS — I4729 Other ventricular tachycardia: Secondary | ICD-10-CM

## 2014-08-29 DIAGNOSIS — I420 Dilated cardiomyopathy: Secondary | ICD-10-CM

## 2014-08-29 DIAGNOSIS — I472 Ventricular tachycardia: Secondary | ICD-10-CM

## 2014-08-29 DIAGNOSIS — I429 Cardiomyopathy, unspecified: Secondary | ICD-10-CM

## 2014-08-29 DIAGNOSIS — I428 Other cardiomyopathies: Secondary | ICD-10-CM

## 2014-08-29 NOTE — Progress Notes (Signed)
Remote ICD transmission.   

## 2014-08-30 LAB — MDC_IDC_ENUM_SESS_TYPE_REMOTE
Battery Voltage: 3.02 V
Brady Statistic AP VS Percent: 22.49 %
Brady Statistic AS VS Percent: 77.43 %
Brady Statistic RV Percent Paced: 0.08 %
HIGH POWER IMPEDANCE MEASURED VALUE: 247 Ohm
HighPow Impedance: 39 Ohm
HighPow Impedance: 46 Ohm
Lead Channel Impedance Value: 342 Ohm
Lead Channel Impedance Value: 361 Ohm
Lead Channel Pacing Threshold Amplitude: 0.75 V
Lead Channel Pacing Threshold Amplitude: 0.75 V
Lead Channel Pacing Threshold Pulse Width: 0.4 ms
Lead Channel Pacing Threshold Pulse Width: 0.4 ms
Lead Channel Sensing Intrinsic Amplitude: 1.875 mV
Lead Channel Sensing Intrinsic Amplitude: 1.875 mV
Lead Channel Setting Pacing Amplitude: 2 V
Lead Channel Setting Pacing Amplitude: 2.5 V
Lead Channel Setting Pacing Pulse Width: 0.4 ms
Lead Channel Setting Sensing Sensitivity: 0.3 mV
MDC IDC MSMT LEADCHNL RV SENSING INTR AMPL: 2.25 mV
MDC IDC MSMT LEADCHNL RV SENSING INTR AMPL: 2.25 mV
MDC IDC SESS DTM: 20160314082508
MDC IDC SET ZONE DETECTION INTERVAL: 320 ms
MDC IDC SET ZONE DETECTION INTERVAL: 350 ms
MDC IDC STAT BRADY AP VP PERCENT: 0.04 %
MDC IDC STAT BRADY AS VP PERCENT: 0.04 %
MDC IDC STAT BRADY RA PERCENT PACED: 22.53 %
Zone Setting Detection Interval: 360 ms
Zone Setting Detection Interval: 400 ms

## 2014-09-02 ENCOUNTER — Telehealth: Payer: Self-pay | Admitting: Family Medicine

## 2014-09-02 NOTE — Telephone Encounter (Signed)
Called patient to see if he had gotten his flu shot please up date if he did if not ask him  If he would like to get one

## 2014-09-02 NOTE — Telephone Encounter (Signed)
Patient all ready gotten flu shot last year of 2015

## 2014-09-15 ENCOUNTER — Other Ambulatory Visit: Payer: Self-pay

## 2014-09-15 ENCOUNTER — Other Ambulatory Visit (HOSPITAL_COMMUNITY): Payer: Self-pay | Admitting: Cardiology

## 2014-09-15 DIAGNOSIS — M25472 Effusion, left ankle: Principal | ICD-10-CM

## 2014-09-15 DIAGNOSIS — M25471 Effusion, right ankle: Secondary | ICD-10-CM

## 2014-09-15 MED ORDER — SACUBITRIL-VALSARTAN 49-51 MG PO TABS: 1.0000 | ORAL_TABLET | Freq: Two times a day (BID) | ORAL | Status: DC

## 2014-09-23 ENCOUNTER — Other Ambulatory Visit: Payer: Self-pay | Admitting: *Deleted

## 2014-09-23 MED ORDER — CLOPIDOGREL BISULFATE 75 MG PO TABS: 75.0000 mg | ORAL_TABLET | Freq: Every day | ORAL | Status: DC

## 2014-09-30 ENCOUNTER — Ambulatory Visit (INDEPENDENT_AMBULATORY_CARE_PROVIDER_SITE_OTHER): Payer: 59 | Admitting: Emergency Medicine

## 2014-09-30 VITALS — BP 120/74 | HR 74 | Resp 18

## 2014-09-30 DIAGNOSIS — M25471 Effusion, right ankle: Secondary | ICD-10-CM

## 2014-09-30 DIAGNOSIS — M25472 Effusion, left ankle: Secondary | ICD-10-CM | POA: Diagnosis not present

## 2014-09-30 DIAGNOSIS — I82402 Acute embolism and thrombosis of unspecified deep veins of left lower extremity: Secondary | ICD-10-CM | POA: Diagnosis not present

## 2014-09-30 DIAGNOSIS — E1140 Type 2 diabetes mellitus with diabetic neuropathy, unspecified: Secondary | ICD-10-CM

## 2014-09-30 DIAGNOSIS — R42 Dizziness and giddiness: Secondary | ICD-10-CM

## 2014-09-30 DIAGNOSIS — Z1211 Encounter for screening for malignant neoplasm of colon: Secondary | ICD-10-CM | POA: Diagnosis not present

## 2014-09-30 DIAGNOSIS — I2510 Atherosclerotic heart disease of native coronary artery without angina pectoris: Secondary | ICD-10-CM | POA: Diagnosis not present

## 2014-09-30 NOTE — Addendum Note (Signed)
Addended by: Arlyss Queen A on: 09/30/2014 09:52 AM   Modules accepted: Level of Service

## 2014-09-30 NOTE — Addendum Note (Signed)
Addended by: Arlyss Queen A on: 09/30/2014 09:26 AM   Modules accepted: Orders, Medications

## 2014-09-30 NOTE — Patient Instructions (Addendum)
Please decrease your  Spironolactone  to every other day.

## 2014-09-30 NOTE — Progress Notes (Signed)
Subjective:  This chart was scribed for Darlyne Russian, MD by Ladene Artist, ED Scribe. The patient was seen in room 9. Patient's care was started at 8:14 AM.   Patient ID: Paul [name], male    DOB: December 10, 1945, 69 y.o.   MRN: [id number]  Chief Complaint  Patient presents with  . Follow-up   HPI HPI Comments: Paul [name] is a 69 y.o. male, with a h/o HTN, CHF, DM, who presents to the [facility] for a follow-up regarding discharge from [facility]. Pt was recommended to start cardiac rehab. On 06/02/14 pt's venous doppler showed acute thrombosis in L posterior vein and L popliteal vein.   Weight Loss Pt reports a 20 lb weight loss since his last visit on 07/18/14 and a 10 lb weight loss in the past 6 weeks.   Stress Pt reports recent stressors which include work at times, Sue's family and getting served by his ex-wife yesterday.   Blood Pressure Pt reports that his blood pressure has been lowering than usual for the past 2 days. He noticed light-headedness 2 days ago when his BP was 100/60. Triage BP today was 120/74. Pt reports that leg swelling has improved.   PSA Pt's last PSA was 1.16 on 10/16/2012.   Diabetes  Pt was diagnosed approximately 15 years ago. He reports mild neuropathy in L second and third toes which he states is stable. Pt's fasting sugars averaged 220 while in the hospital and off medications.   Preventative Maintenance  Pt has not had a Colonoscopy in 10 years. Pt has not seen an ophthalmologist in a while but has  seen an optometrist 1 year ago.   Immunizations Pt has received pneumovax, prevnar and shingles vaccines.   Past Medical History  Diagnosis Date  . Cataract   . Hypertension   . Hypovitaminosis D   . Hypogonadism male   . Ventricular tachycardia     observed during stress testing, subsequent VT on EPS  . 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 Erectile dysfunction   . CHF (congestive heart failure)       "mild" (06/01/2013)  . Type II diabetes mellitus   . GERD (gastroesophageal reflux disease)   . Arthritis     "thumbs, neck, knees" (06/01/2013)   Current Outpatient Prescriptions on File Prior to Visit  Medication Sig Dispense Refill  . Blood Glucose Monitoring Suppl (BLOOD GLUCOSE MONITOR SYSTEM) W/DEVICE KIT Glucose monitor to check blood sugars tid dx code E11.9 brand per insurance coverage 1 each 0  . carvedilol (COREG) 6.25 MG tablet Take 1 tablet (6.25 mg total) by mouth 2 (two) times daily. 60 tablet 3  . celecoxib (CELEBREX) 200 MG capsule Take 1 tablet daily 90 capsule 1  . Cholecalciferol ([employer] VIT D 5000 HIGH-POTENCY PO) Take 10,000 Units by mouth daily.    . clopidogrel (PLAVIX) 75 MG tablet Take 1 tablet (75 mg total) by mouth daily. 30 tablet 0  . furosemide (LASIX) 40 MG tablet Take 40 mg in the AM and 20 mg in the PM 90 tablet 2  . glimepiride (AMARYL) 4 MG tablet Take 8 mg by mouth daily with breakfast.    . glucose blood test strip Use  to check blood sugars tid dx code E11.9 brand per insurance coverage 100 each 12  . Insulin Pen Needle (CLICKFINE PEN NEEDLES) 31G X 6 MM MISC For tid injections 100 each 11  . Lancets MISC Glucose monitor to check blood  sugars tid dx code E11.9 brand per insurance coverage 100 each 11  . Liraglutide (VICTOZA) 18 MG/3ML SOPN Inject 1.8 mg into the skin daily. 15 pen 1  . magnesium oxide (MAG-OX) 400 MG tablet Take 1 tablet (400 mg total) by mouth daily. 30 tablet 3  . metFORMIN (GLUCOPHAGE-XR) 500 MG 24 hr tablet TAKE 1 TABLET BY MOUTH 4 TIMES DAILY 360 tablet 1  . metolazone (ZAROXOLYN) 2.5 MG tablet Take 1 tablet (2.5 mg total) by mouth 2 or 3 times per week for swelling. 12 tablet 6  . pravastatin (PRAVACHOL) 20 MG tablet Take 1 tablet (20 mg total) by mouth every evening. 90 tablet 3  . rivaroxaban (XARELTO) 20 MG TABS tablet Take 1 tablet (20 mg total) by mouth daily with supper. 90 tablet 1  . sacubitril-valsartan (ENTRESTO) 49-51  MG Take 1 tablet by mouth 2 (two) times daily. 60 tablet 12  . spironolactone (ALDACTONE) 25 MG tablet Take 0.5 tablets (12.5 mg total) by mouth daily. (Patient taking differently: Take 25 mg by mouth daily. ) 90 tablet 3  . tamsulosin (FLOMAX) 0.4 MG CAPS capsule Take 0.4 mg by mouth daily.      No current facility-administered medications on file prior to visit.   Allergies  Allergen Reactions  . Lisinopril     angioedema    Review of Systems  Cardiovascular: Positive for leg swelling.  Neurological: Positive for light-headedness and numbness.      Objective:   Physical Exam CONSTITUTIONAL: Well developed/well nourished HEAD: Normocephalic/atraumatic EYES: EOMI/PERRL ENMT: Mucous membranes moist NECK: supple no meningeal signs, no carotid bruit  SPINE/BACK:entire spine nontender CV: S1/S2 noted, no gallops/murmurs/rubs noted, occasional skipped beats LUNGS: Lungs are clear to auscultation bilaterally, no apparent distress ABDOMEN: soft, nontender, no rebound or guarding, bowel sounds noted throughout abdomen GU:no cva tenderness NEURO: Pt is awake/alert/appropriate, moves all extremitiesx4.  No facial droop.   EXTREMITIES: pulses normal/equal, full ROM, 1+ swelling of the lower extremities but no calf tenderness  SKIN: warm, color normal PSYCH: no abnormalities of mood noted, alert and oriented to situation    Assessment & Plan:   Recent labs at [facility] revealed cholesterol 158 with a LDL of 100 HDL of 26. Hemoglobin A1c was 8.8. CRP was elevated at 6.37. Patient has normal renal function with a creatinine of 1.1 in the BUNs of 14. Last PSA was checked and was 1.16  2 years ago. Patient is currently on 3 blood thinners Plavix aspirin and Xarelto. He is on 3 drugs for diabetes to include Victoza Amaryl, and metformin. We'll ask for help from the endocrinologist since A1c is 8.8. Will consider addition of Invokana. Will decrease spironolactone to every other day and hopefully  dizziness and hypotensive episodes will diminish. Doppler ultrasound ordered of the lower extremities. If this is normal will consider DC of Xarelto and following this obtained a coagulation panel. Referrals made to Dr. Carlean Purl for colonoscopy to Dr. Zadie Rhine for a check and as noted for a Doppler ultrasound.I personally performed the services described in this documentation, which was scribed in my presence. The recorded information has been reviewed and is accurate.

## 2014-09-30 NOTE — Addendum Note (Signed)
Addended by: Arlyss Queen A on: 09/30/2014 09:22 AM   Modules accepted: Orders

## 2014-10-01 ENCOUNTER — Ambulatory Visit (HOSPITAL_COMMUNITY): Payer: 59 | Attending: Emergency Medicine

## 2014-10-09 NOTE — H&P (Signed)
PATIENT NAME:  Paul [name], Paul [name] MR#:  [id number] DATE OF BIRTH:  1946/04/26  DATE OF ADMISSION:  09/14/2011  PRIMARY CARE PHYSICIAN: [facility]   CHIEF COMPLAINT: Hives and swelling of throat.   HISTORY OF PRESENT ILLNESS: Patient is a 69 year old male with history of hypertension, diabetes. Had  peanut M`M from [place] candy this afternoon and patient noted hives after 15 minutes of eating and swelling of the lips and throat and voice change was noticed within 15 minutes so he went to [facility]. He was given Solu-Medrol and Zantac. Patient was sent to the Emergency Room because he was persistently hypotensive. Patient seen in the ER and already received 2 liters of fluid normal saline. Blood pressure was still borderline, systolic 31/49 and epinephrine drip was started with the ER doctor, Dr. Conni Slipper. Patient says his swelling in the lips is improved a lot, no trouble breathing and no chest pain and has no symptoms of dizziness. Hives are still present but has no itching. Patient took a Benadryl 75 mg at home and says that this is the first time he had these symptoms. Did eat peanut candy before but never had these symptoms. Patient denies any diarrhea. No abdominal pain. No chest pain. Patient is able to talk in full sentences. The rash is mainly generalized.   PAST MEDICAL HISTORY:  1. Hypertension.  2. Diabetes.  3. History of sustained V. tach. 4. History of systolic heart failure with ejection fraction of around 42% a year ago by his primary doctor.   MEDICATIONS: 1. Lisinopril 10 mg daily.  2. Diovan/HCTZ 160/25 mg p.o. daily.  3. Metformin 2 grams at night. 4. Amaryl 4 mg daily. 5. Victoza.  6. Aspirin 81 mg daily.   SOCIAL HISTORY: No smoking, no drinking, no drugs.   PAST SURGICAL HISTORY:  1. Laminectomy. 2. History of pacemaker placement and defibrillator. Patient has a history of sustained V. tach and he had defibrillator placed a year ago.   FAMILY HISTORY:  Significant for hypertension and heart failure for mother and history of prostate cancer for father.   REVIEW OF SYSTEMS: CONSTITUTIONAL: Denies any fatigue. No trouble breathing. EYES: No blurred vision. ENT: No tinnitus. No epistaxis. No difficulty swallowing at this time. Patient has swelling of the lips and tongue on presentation. RESPIRATORY: Denies any trouble breathing. CARDIOVASCULAR: No chest pain. GASTROINTESTINAL: No nausea. No vomiting. No abdominal pain. No diarrhea. GENITOURINARY: No dysuria. ENDOCRINE: No polyuria or nocturia. INTEGUMENT: Patient does have hives all over the body. MUSCULOSKELETAL: Denies any joint pain. NEUROLOGIC: No weakness. No dysarthria. Speech was slurry and had swelling. Right now he says his speech is clear. PSYCH: No anxiety or insomnia.   PHYSICAL EXAMINATION: VITAL SIGNS: Blood pressure 79/66, pulse 79, respirations 18 to 20, sats 98% on room air.   GENERAL: Patient already received 2 liters of normal saline in the ER and getting epinephrine drip at 1 mcg/minute. He is alert, awake, oriented, able to talk in full sentences. Not in respiratory distress.   HEENT: Head atraumatic, normocephalic. Pupils are equally reacting to light. Patient has slightly swollen lips but according to family they are much better. ENT: No tympanic membrane congestion. No turbinate hypertrophy. No oropharyngeal erythema. Tongue swelling is slightly present.   NECK: Normal range of motion. No JVD. No carotid bruits.  RESPIRATORY: Clear to auscultation. No wheeze. No rales.   ABDOMEN: Soft, nontender, nondistended. Bowel sounds present.   CARDIOVASCULAR: S1, S2 regular. No murmurs.   MUSCULOSKELETAL: Strength 5/5  upper and lower extremities.   SKIN: Has generalized hives present. Has no conjunctival swelling and no periorbital edema.   LYMPH NODES: No lymphadenopathy.   NEUROLOGICAL: Cranial nerves II through XII intact. No motor or sensory deficit.   PSYCH: Oriented to  time, place, person.   LABORATORY, DIAGNOSTIC AND RADIOLOGICAL DATA: Chest x-ray shows no acute cardiopulmonary abnormality and pacemaker present. WBC 10.4, hemoglobin 17, hematocrit 52.5, platelets 194. Electrolytes: Sodium 142, potassium 3.4, chloride 106, bicarbonate 24, BUN 20, creatinine 1.32, glucose 151, calcium 6.8, albumin 3.1.   Troponin less than 0.02. EKG: Normal sinus with no ST-T changes.   ASSESSMENT AND PLAN: Patient is a 69 year old male with:  1. Anaphylactic shock after eating M and Ms. Patient still hypotensive after 2 liters so epinephrine is started. Epinephrine can be continued and will start normal saline at 125 mL/h. Patient has no other cardiovascular symptoms,  or dizziness so if the blood pressure is stable on IV fluids we will taper off the epinephrine drip and continue Solu-Medrol at 1 mg/kg per day so at least until tomorrow and given Benadryl for itching and also hives. Patient's beta blockers and ACE inhibitors we will be held due to hypotension  2. Hypocalcemia. Calcium is already replaced. Patient did receive calcium by ER doctor. Will check the BMP in the morning.  3. Diabetes mellitus, type 2. Continue the metformin and also glyburide and continue fingersticks with coverage.  4. History of congestive heart failure, systolic heart failure with ejection fraction of around 42% according to patient a year ago so we will watch it at this time and it is compensated, not hypoxic. Hold ACE inhibitor and beta blocker   because of hypotension. Can be resumed as an outpatient. Patient has other diagnosis of sustain V. tach and had a defibrillator placed so continue the monitoring in Intensive Care Unit. If patient can be off the epinephrine drip and the blood pressure is stable will move him off Intensive Care Unit to the tele  Discussed the plan with the patient. Patient will be admitted to Intensive Care Unit for close monitoring of blood pressure and continue epinephrine drip  and IV fluids. 5. GI prophylaxis with Protonix 40 mg IV daily. Condition is stable.   TIME SPENT: 60 minutes.   ____________________________ Epifanio Lesches, MD sk:cms D: 09/14/2011 18:50:09 ET T: 09/15/2011 17:40:81 ET JOB#: [id number]  cc: Epifanio Lesches, MD, <Dictator> [facility] Epifanio Lesches MD ELECTRONICALLY SIGNED 09/15/2011 16:38

## 2014-10-09 NOTE — Discharge Summary (Signed)
PATIENT NAME:  Paul [name], TIMMONS MR#:  [id number] DATE OF BIRTH:  June 07, 1946  DATE OF ADMISSION:  09/14/2011 DATE OF DISCHARGE:  09/15/2011  DISCHARGE DIAGNOSES:  1. Anaphylactic shock/angioedema after eating M and M Easter candies. 2. Diabetes. 3. Hypertension. 4. History of cardiomyopathy, status post automatic implantable cardiac defibrillator placement.  5. Hypokalemia.  6. Hypomagnesemia. 7. Hypocalcemia.   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 He has history of diabetes, hypertension, and cardiomyopathy. He had previous AICD placement. He presented to the Emergency Room with hives, lip and tongue swelling 10 minutes after eating Easter M and M candies. He received sub-Q epinephrine in the Emergency Room along with IV Solu-Medrol and Benadryl and he also received aggressive fluid hydration. He was also hypotensive when he arrived, because of his hypotension, he was started on epinephrine drip and on fluids. He was also continued on steroids. All his antihypertensives were held. Now all his symptoms have resolved right now. His hives have resolved. There is no lip swelling or tongue swelling. No difficulty breathing. He is swallowing well. Eyelid swelling has resolved. He was weaned off the epinephrine drip last night. We will discharge him on a prednisone taper, Benadryl as needed, and H2-blocker with ranitidine. He says he has been taking the ACE inhibitor for a long time and he had no problem with that. This is his first episode of angioedema. I am going to hold his ACE and angiotensin receptor blocker at this time and just continue the beta-blocker at home since his blood pressure has stabilized. If his blood pressure goes high, his angiotensin receptor blocker can be restarted again and advised to keep him off the ACE for now with his episode of angioedema. Angiotensin receptor blockers are less likely to cause angioedema and ACE are more  likely to cause angioedema. When he came in his white count was normal at 10.4. Hemoglobin was 17.7. His white count went up slightly high at 12.9 today most likely because of steroids. When he came in, he had a creatinine of 1.32. His potassium was slightly low at 3.4 and his calcium was 6.8. He got 1 gram of IV calcium gluconate in the Emergency Room. Today his potassium is 3.2. Will give him 40 mEq of p.o. potassium. We checked his magnesium level which is 1. I'm going to give him 4 grams of IV mag sulfate and give him mag oxide for home. His calcium is 6.6 with an albumin of 2.4, corrected calcium of around 7.6. I'm going to give him some p.o. calcium for home but with his replacement of magnesium that should improve. His chest x-ray was negative. His EKG when he came in showed that he had a sinus rhythm with left axis deviation with right bundle branch block, bifascicular block.   MEDICATIONS AT DISCHARGE:  1. Metoprolol. 2. Metformin. 3. Amaryl. 4. Victoza.   NEW MEDICATIONS:  1. Prednisone taper as directed. 2. Benadryl 25 mg p.o. q.6 hours as needed.  3. Ranitidine 150 mg p.o. b.i.d. for five days.  5. Calcium carbonate 500 mg p.o. b.i.d.  6. Magnesium oxide 400 mg p.o. b.i.d. for three days.   DO NOT TAKE:  1. Lisinopril. 2. Diovan/hydrochlorothiazide.   CONDITION AT DISCHARGE: He is comfortable. His lip and tongue swelling have resolved. T-max 97.8, heart rate 92, blood pressure 152/75, saturating 97% on room air. CHEST is clear. HEART sounds are regular. ABDOMEN soft, nontender.   DIET: Advised a  low sodium, ADA diet.   FOLLOWUP: Follow-up with Dr. Linton Rump at [facility] in the next 1 to 2 days. Follow-up BMP and serum magnesium level at Dr. Linton Rump' office. We are going to give him copies of his labs at discharge.  TIME SPENT ON DISCHARGE: 60 minutes.  ____________________________ Mena Pauls, MD ag:drc D: 09/15/2011 09:09:44 ET T: 09/16/2011 13:16:38  ET JOB#: [id number] cc: Mena Pauls, MD, <Dictator>, Dr. Linton Rump in [place] MD ELECTRONICALLY SIGNED 09/26/2011 17:18

## 2014-10-11 ENCOUNTER — Telehealth (HOSPITAL_COMMUNITY): Payer: Self-pay | Admitting: *Deleted

## 2014-10-20 ENCOUNTER — Other Ambulatory Visit: Payer: Self-pay

## 2014-10-20 NOTE — Telephone Encounter (Signed)
Dr Everlene Farrier, pt just had check up w/you, but I didn't see this med discussed. I pended for a yr for you review.

## 2014-10-21 MED ORDER — TAMSULOSIN HCL 0.4 MG PO CAPS: 0.4000 mg | ORAL_CAPSULE | Freq: Every day | ORAL | Status: DC

## 2014-10-28 ENCOUNTER — Ambulatory Visit (INDEPENDENT_AMBULATORY_CARE_PROVIDER_SITE_OTHER): Payer: 59 | Admitting: Internal Medicine

## 2014-10-28 ENCOUNTER — Encounter: Payer: Self-pay | Admitting: Internal Medicine

## 2014-10-28 VITALS — BP 124/62 | HR 60 | Temp 98.3°F | Resp 12 | Ht 70.0 in | Wt 233.6 lb

## 2014-10-28 DIAGNOSIS — E1140 Type 2 diabetes mellitus with diabetic neuropathy, unspecified: Secondary | ICD-10-CM

## 2014-10-28 DIAGNOSIS — E1142 Type 2 diabetes mellitus with diabetic polyneuropathy: Secondary | ICD-10-CM

## 2014-10-28 DIAGNOSIS — E1165 Type 2 diabetes mellitus with hyperglycemia: Principal | ICD-10-CM

## 2014-10-28 NOTE — Progress Notes (Signed)
Patient ID: Paul [name], male   DOB: Jun 16, 1946, 69 y.o.   MRN: [id number]  HPI: [name] is a 69 y.o.-year-old male, referred by his PCP, Dr. Everlene Farrier, for management of DM2, dx in 2001, non-insulin-dependent, uncontrolled, with complications (mild PN).  Last hemoglobin A1c was: 004/01/2015: 8.8% Lab Results  Component Value Date   HGBA1C 8.8 05/26/2014   HGBA1C 9.4 03/25/2014   HGBA1C 6.2 10/20/2012   Pt is on a regimen of: - Metformin XR 2000 mg at bedtime - Amaryl 8 mg in am - Victoza 1.8 mg daily in am  Pt checks his sugars 1x a day and they are: - am: 150-200 - 2h after b'fast: n/c - before lunch: n/c - 2h after lunch: n/c - before dinner: n/c - 2h after dinner: n/c - bedtime: n/c - nighttime: n/c No lows. Lowest sugar was 100; he has hypoglycemia awareness at 70.  Highest sugar was 200 at home.  Glucometer: OneTouch Ultra  Pt's meals are: - Breakfast: cereals + blueberries - Lunch: chinese food or sandwich, fruit - Dinner: meat + veggies or salad - Snacks: no  - + CKD, last BUN/creatinine:  09/22/2013: 13/1.1, GFR >60 Lab Results  Component Value Date   BUN 23 07/22/2014   CREATININE 1.42* 07/22/2014  On Valsartan. Angioedema with ACEI. - last set of lipids: 09/2014: 158/157/25/100 Lab Results  Component Value Date   CHOL 190 03/25/2014   HDL 35* 03/25/2014   LDLCALC 84 03/25/2014   TRIG 357* 03/25/2014   CHOLHDL 5.4 03/25/2014  On Lipitor. - last eye exam was in 2015. No DR. Has an appt in June. - + numbness and tingling in his L foot  Pt has FH of DM in sisters.   Patient has a history of trigeminy, status post endocardial ablation at [facility]. He also needs to have an epicardial ablation, for which he was evaluated in 09/2014 at [facility]. During that time, he had 2 stents latest, and the ablation procedure was postponed. He is followed by Dr. Dwana Curd.  He was also recently found to have obstructive sleep apnea, now on CPAP for the last  month.  ROS: Constitutional: no weight gain/loss, + fatigue, no subjective hyperthermia/hypothermia Eyes: no blurry vision, no xerophthalmia ENT: no sore throat, no nodules palpated in throat, no dysphagia/odynophagia, no hoarseness, + decreased hearing, + tinnitus Cardiovascular: no CP/+ SOB/+ palpitations/+ leg swelling Respiratory: no cough/+ SOB Gastrointestinal: no N/V/D/C Musculoskeletal: no muscle/+ joint aches - knee Skin: no rashes Neurological: no tremors/numbness/tingling/dizziness Psychiatric: no depression/anxiety + diff with erections, + low libido  Past Medical History  Diagnosis Date  . Cataract   . Hypertension   . Hypovitaminosis D   . Hypogonadism male   . Ventricular tachycardia     observed during stress testing, subsequent VT on EPS  . 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 Erectile dysfunction   . CHF (congestive heart failure)     "mild" (06/01/2013)  . Type II diabetes mellitus   . GERD (gastroesophageal reflux disease)   . Arthritis     "thumbs, neck, knees" (06/01/2013)   Past Surgical History  Procedure Laterality Date  . Lumbar laminectomy  ~ 2004  . Vasectomy    . Tonsillectomy and adenoidectomy  1972  . Knee arthroscopy Left 1984  . Cardiac defibrillator placement  05/09/2010    MDT ICD implanted in [place] by Dr Ky Barban  . Ventricular ablation surgery  06/01/2013  . Appendectomy  1969  . Cataract extraction w/ intraocular  lens  implant, bilateral Bilateral   . Cardiac catheterization  04/2010  . Ablation  06-01-2013    PVC's ablated along basal inferoseptal RV by Dr Allred  . V-tach ablation N/A 06/01/2013    Procedure: V-TACH ABLATION;  Surgeon: James D Allred, MD;  Location: [facility] CATH LAB;  Service: Cardiovascular;  Laterality: N/A;   History   Social History  . Marital Status: Married    Spouse Name: N/A  . Number of Children: 2   Occupational History  . physician   Social History Main Topics  . Smoking status:  Never Smoker   . Smokeless tobacco: Never Used  . Alcohol Use: 1.2 oz/week    1 Glasses scotch, rarely  . 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    Moved here from [place] 1.5 years ago   Current Outpatient Prescriptions on File Prior to Visit  Medication Sig Dispense Refill  . atorvastatin (LIPITOR) 40 MG tablet Take 40 mg by mouth daily.    . Blood Glucose Monitoring Suppl (BLOOD GLUCOSE MONITOR SYSTEM) W/DEVICE KIT Glucose monitor to check blood sugars tid dx code E11.9 brand per insurance coverage 1 each 0  . carvedilol (COREG) 6.25 MG tablet Take 1 tablet (6.25 mg total) by mouth 2 (two) times daily. 60 tablet 3  . Cholecalciferol (CVS VIT D 5000 HIGH-POTENCY PO) Take 10,000 Units by mouth daily.    . clopidogrel (PLAVIX) 75 MG tablet Take 1 tablet (75 mg total) by mouth daily. 30 tablet 0  . furosemide (LASIX) 40 MG tablet Take 40 mg in the AM and 20 mg in the PM 90 tablet 2  . glimepiride (AMARYL) 4 MG tablet Take 8 mg by mouth daily with breakfast.    . glucose blood test strip Use  to check blood sugars tid dx code E11.9 brand per insurance coverage 100 each 12  . Insulin Pen Needle (CLICKFINE PEN NEEDLES) 31G X 6 MM MISC For tid injections 100 each 11  . Lancets MISC Glucose monitor to check blood sugars tid dx code E11.9 brand per insurance coverage 100 each 11  . Liraglutide (VICTOZA) 18 MG/3ML SOPN Inject 1.8 mg into the skin daily. 15 pen 1  . magnesium oxide (MAG-OX) 400 MG tablet Take 1 tablet (400 mg total) by mouth daily. 30 tablet 3  . metFORMIN (GLUCOPHAGE-XR) 500 MG 24 hr tablet TAKE 1 TABLET BY MOUTH 4 TIMES DAILY 360 tablet 1  . rivaroxaban (XARELTO) 20 MG TABS tablet Take 1 tablet (20 mg total) by mouth daily with supper. 90 tablet 1  . sacubitril-valsartan (ENTRESTO) 49-51 MG Take 1 tablet by mouth 2 (two) times daily. 60 tablet 12  . spironolactone (ALDACTONE) 25  MG tablet Take 0.5 tablets (12.5 mg total) by mouth daily. (Patient taking differently: Take 25 mg by mouth daily. ) 90 tablet 3  . tamsulosin (FLOMAX) 0.4 MG CAPS capsule Take 1 capsule (0.4 mg total) by mouth daily. 90 capsule 3  . metolazone (ZAROXOLYN) 2.5 MG tablet Take 1 tablet (2.5 mg total) by mouth 2 or 3 times per week for swelling. (Patient not taking: Reported on 10/28/2014) 12 tablet 6  . pravastatin (PRAVACHOL) 20 MG tablet Take 1 tablet (20 mg total) by mouth every evening. (Patient not taking: Reported on 10/28/2014) 90 tablet 3   No current facility-administered medications on file prior to visit.   Allergies  Allergen   Reactions  . Lisinopril     angioedema   Family History  Problem Relation Age of Onset  . Alcohol abuse Mother   . COPD Mother   . Depression Mother   . Heart disease Mother   . Hypertension Mother   . Stroke Mother   . COPD Father   . Heart disease Father   . Hypertension Father   . Stroke Father   . Cancer Father   . Diabetes Sister   . Mental retardation Sister   . Learning disabilities Sister   . Hypertension Sister   . Alcohol abuse Brother   . Drug abuse Brother   . Heart disease Brother   . Hypertension Brother   . Coronary artery disease Brother    PE: BP 124/62 mmHg  Pulse 60  Temp(Src) 98.3 F (36.8 C) (Oral)  Resp 12  Ht 5' 10" (1.778 m)  Wt 233 lb 9.6 oz (105.96 kg)  BMI 33.52 kg/m2  SpO2 97% Wt Readings from Last 3 Encounters:  10/28/14 233 lb 9.6 oz (105.96 kg)  05/27/14 237 lb 6.4 oz (107.684 kg)  04/19/14 235 lb (106.595 kg)   Constitutional: overweight, in NAD Eyes: PERRLA, EOMI, no exophthalmos ENT: moist mucous membranes, no thyromegaly, no cervical lymphadenopathy Cardiovascular: regularly irregular (skipped beats), RR, No MRG, + B LE edema, pitting, L>R (after DVT) Respiratory: CTA B Gastrointestinal: abdomen soft, NT, ND, BS+ Musculoskeletal: no deformities, strength intact in all 4 Skin: moist, warm, no  rashes Neurological:  Mild tremor with outstretched hands, DTR normal in all 4  ASSESSMENT: 1. DM2, non-insulin-dependent, uncontrolled, with complications - mild PN  PLAN:  1. Patient with long-standing, uncontrolled diabetes, on oral antidiabetic regimen, which became insufficient. He only checks sugars in am (they are high), so I am not sure if the problem is not enough mealtime coverage or not enough suppression of gluconeogenesis overnight.  My bias is to believe that his meals are not completely covered, since he takes his entire 2000 mg metformin ER  dose at night, which should greatly help to suppress his sugars overnight.  I will therefore advised him to split his Amaryl into 2 doses, before breakfast and dinner. If at next visit his sugars are not better controlled, we can add Invokana. - We discussed about options for treatment, and I suggested to:  Patient Instructions  Please continue: - Metformin XR 2000 mg at bedtime - Victoza 1.8 mg daily in am  Please split Amaryl into 4 mg 2x a day, before meals.  Please return in 1 month with your sugar log.   - Strongly advised him to start checking sugars at different times of the day - check 2 times a day, rotating checks - given sugar log and advised how to fill it and to bring it at next appt  - given foot care handout and explained the principles  - given instructions for hypoglycemia management "15-15 rule"  - advised for yearly eye exams -  He needs a new one - we reviewed his last hemoglobin A1c from [facility] (care everywhere), which was similar to before, 8.8% - Return to clinic in 1-2 mo with sugar log

## 2014-10-28 NOTE — Patient Instructions (Signed)
Please continue: - Metformin XR 2000 mg at bedtime - Victoza 1.8 mg daily in am  Please split Amaryl into 4 mg 2x a day, before meals.  Please return in 1 month with your sugar log.   PATIENT INSTRUCTIONS FOR TYPE 2 DIABETES:  **Please join MyChart!** - see attached instructions about how to join if you have not done so already.  DIET AND EXERCISE Diet and exercise is an important part of diabetic treatment.  We recommended aerobic exercise in the form of brisk walking (working between 40-60% of maximal aerobic capacity, similar to brisk walking) for 150 minutes per week (such as 30 minutes five days per week) along with 3 times per week performing 'resistance' training (using various gauge rubber tubes with handles) 5-10 exercises involving the major muscle groups (upper body, lower body and core) performing 10-15 repetitions (or near fatigue) each exercise. Start at half the above goal but build slowly to reach the above goals. If limited by weight, joint pain, or disability, we recommend daily walking in a swimming pool with water up to waist to reduce pressure from joints while allow for adequate exercise.    BLOOD GLUCOSES Monitoring your blood glucoses is important for continued management of your diabetes. Please check your blood glucoses 2-4 times a day: fasting, before meals and at bedtime (you can rotate these measurements - e.g. one day check before the 3 meals, the next day check before 2 of the meals and before bedtime, etc.).   HYPOGLYCEMIA (low blood sugar) Hypoglycemia is usually a reaction to not eating, exercising, or taking too much insulin/ other diabetes drugs.  Symptoms include tremors, sweating, hunger, confusion, headache, etc. Treat IMMEDIATELY with 15 grams of Carbs: . 4 glucose tablets .  cup regular juice/soda . 2 tablespoons raisins . 4 teaspoons sugar . 1 tablespoon honey Recheck blood glucose in 15 mins and repeat above if still symptomatic/blood glucose  <100.  RECOMMENDATIONS TO REDUCE YOUR RISK OF DIABETIC COMPLICATIONS: * Take your prescribed MEDICATION(S) * Follow a DIABETIC diet: Complex carbs, fiber rich foods, (monounsaturated and polyunsaturated) fats * AVOID saturated/trans fats, high fat foods, >2,300 mg salt per day. * EXERCISE at least 5 times a week for 30 minutes or preferably daily.  * DO NOT SMOKE OR DRINK more than 1 drink a day. * Check your FEET every day. Do not wear tightfitting shoes. Contact us if you develop an ulcer * See your EYE doctor once a year or more if needed * Get a FLU shot once a year * Get a PNEUMONIA vaccine once before and once after age 71 years  GOALS:  * Your Hemoglobin A1c of <7%  * fasting sugars need to be <130 * after meals sugars need to be <180 (2h after you start eating) * Your Systolic BP should be 270 or lower  * Your Diastolic BP should be 80 or lower  * Your HDL (Good Cholesterol) should be 40 or higher  * Your LDL (Bad Cholesterol) should be 100 or lower. * Your Triglycerides should be 150 or lower  * Your Urine microalbumin (kidney function) should be <30 * Your Body Mass Index should be 25 or lower    Please consider the following ways to cut down carbs and fat and increase fiber and micronutrients in your diet: - substitute whole grain for white bread or pasta - substitute brown rice for white rice - substitute 90-calorie flat bread pieces for slices of bread when possible - substitute sweet  potatoes or yams for white potatoes - substitute humus for margarine - substitute tofu for cheese when possible - substitute almond or rice milk for regular milk (would not drink soy milk daily due to concern for soy estrogen influence on breast cancer risk) - substitute dark chocolate for other sweets when possible - substitute water - can add lemon or orange slices for taste - for diet sodas (artificial sweeteners will trick your body that you can eat sweets without getting calories and  will lead you to overeating and weight gain in the long run) - do not skip breakfast or other meals (this will slow down the metabolism and will result in more weight gain over time)  - can try smoothies made from fruit and almond/rice milk in am instead of regular breakfast - can also try old-fashioned (not instant) oatmeal made with almond/rice milk in am - order the dressing on the side when eating salad at a restaurant (pour less than half of the dressing on the salad) - eat as little meat as possible - can try juicing, but should not forget that juicing will get rid of the fiber, so would alternate with eating raw veg./fruits or drinking smoothies - use as little oil as possible, even when using olive oil - can dress a salad with a mix of balsamic vinegar and lemon juice, for e.g. - use agave nectar, stevia sugar, or regular sugar rather than artificial sweateners - steam or broil/roast veggies  - snack on veggies/fruit/nuts (unsalted, preferably) when possible, rather than processed foods - reduce or eliminate aspartame in diet (it is in diet sodas, chewing gum, etc) Read the labels!  Try to read Dr. Janene Harvey book: "Program for Reversing Diabetes" for other ideas for healthy eating.

## 2014-11-21 ENCOUNTER — Encounter (INDEPENDENT_AMBULATORY_CARE_PROVIDER_SITE_OTHER): Payer: 59 | Admitting: Ophthalmology

## 2014-11-21 DIAGNOSIS — I10 Essential (primary) hypertension: Secondary | ICD-10-CM

## 2014-11-21 DIAGNOSIS — H43813 Vitreous degeneration, bilateral: Secondary | ICD-10-CM | POA: Diagnosis not present

## 2014-11-21 DIAGNOSIS — H35033 Hypertensive retinopathy, bilateral: Secondary | ICD-10-CM | POA: Diagnosis not present

## 2014-11-21 DIAGNOSIS — H33302 Unspecified retinal break, left eye: Secondary | ICD-10-CM | POA: Diagnosis not present

## 2014-11-29 ENCOUNTER — Telehealth: Payer: Self-pay | Admitting: Cardiology

## 2014-11-29 ENCOUNTER — Encounter (INDEPENDENT_AMBULATORY_CARE_PROVIDER_SITE_OTHER): Payer: 59 | Admitting: Ophthalmology

## 2014-11-29 ENCOUNTER — Ambulatory Visit: Payer: 59 | Admitting: *Deleted

## 2014-11-29 DIAGNOSIS — H33302 Unspecified retinal break, left eye: Secondary | ICD-10-CM | POA: Diagnosis not present

## 2014-11-29 NOTE — Telephone Encounter (Signed)
Spoke with pt and reminded pt of remote transmission that is due today. Pt verbalized understanding.   

## 2014-11-30 ENCOUNTER — Encounter: Payer: Self-pay | Admitting: Cardiology

## 2014-12-01 ENCOUNTER — Encounter: Payer: Self-pay | Admitting: Internal Medicine

## 2014-12-01 ENCOUNTER — Ambulatory Visit (INDEPENDENT_AMBULATORY_CARE_PROVIDER_SITE_OTHER): Payer: 59 | Admitting: *Deleted

## 2014-12-01 DIAGNOSIS — I429 Cardiomyopathy, unspecified: Secondary | ICD-10-CM | POA: Diagnosis not present

## 2014-12-01 DIAGNOSIS — I428 Other cardiomyopathies: Secondary | ICD-10-CM

## 2014-12-01 DIAGNOSIS — I5022 Chronic systolic (congestive) heart failure: Secondary | ICD-10-CM

## 2014-12-08 NOTE — Progress Notes (Signed)
Remote ICD transmission.   

## 2014-12-11 LAB — CUP PACEART REMOTE DEVICE CHECK
Battery Voltage: 3 V
Brady Statistic AP VP Percent: 0.06 %
Brady Statistic AP VS Percent: 37.02 %
Brady Statistic AS VS Percent: 62.88 %
Brady Statistic RV Percent Paced: 0.1 %
Date Time Interrogation Session: 20160617015407
HIGH POWER IMPEDANCE MEASURED VALUE: 42 Ohm
HighPow Impedance: 285 Ohm
HighPow Impedance: 52 Ohm
Lead Channel Impedance Value: 361 Ohm
Lead Channel Pacing Threshold Amplitude: 0.75 V
Lead Channel Pacing Threshold Amplitude: 0.75 V
Lead Channel Pacing Threshold Pulse Width: 0.4 ms
Lead Channel Sensing Intrinsic Amplitude: 1.75 mV
Lead Channel Sensing Intrinsic Amplitude: 1.75 mV
Lead Channel Sensing Intrinsic Amplitude: 2.75 mV
Lead Channel Setting Pacing Amplitude: 2 V
Lead Channel Setting Pacing Pulse Width: 0.4 ms
Lead Channel Setting Sensing Sensitivity: 0.3 mV
MDC IDC MSMT LEADCHNL RA IMPEDANCE VALUE: 399 Ohm
MDC IDC MSMT LEADCHNL RA SENSING INTR AMPL: 2.75 mV
MDC IDC MSMT LEADCHNL RV PACING THRESHOLD PULSEWIDTH: 0.4 ms
MDC IDC SET LEADCHNL RV PACING AMPLITUDE: 2.5 V
MDC IDC SET ZONE DETECTION INTERVAL: 350 ms
MDC IDC STAT BRADY AS VP PERCENT: 0.04 %
MDC IDC STAT BRADY RA PERCENT PACED: 37.08 %
Zone Setting Detection Interval: 320 ms
Zone Setting Detection Interval: 360 ms
Zone Setting Detection Interval: 400 ms

## 2014-12-16 ENCOUNTER — Other Ambulatory Visit (HOSPITAL_COMMUNITY): Payer: Self-pay | Admitting: Cardiology

## 2014-12-29 ENCOUNTER — Ambulatory Visit: Payer: 59 | Admitting: Internal Medicine

## 2015-01-02 ENCOUNTER — Other Ambulatory Visit: Payer: Self-pay | Admitting: Emergency Medicine

## 2015-01-02 DIAGNOSIS — E088 Diabetes mellitus due to underlying condition with unspecified complications: Secondary | ICD-10-CM

## 2015-01-02 MED ORDER — LIRAGLUTIDE 18 MG/3ML ~~LOC~~ SOPN: 1.8000 mg | PEN_INJECTOR | Freq: Every day | SUBCUTANEOUS | Status: DC

## 2015-01-04 ENCOUNTER — Encounter: Payer: Self-pay | Admitting: Internal Medicine

## 2015-01-18 ENCOUNTER — Encounter: Payer: Self-pay | Admitting: Internal Medicine

## 2015-01-18 ENCOUNTER — Ambulatory Visit (INDEPENDENT_AMBULATORY_CARE_PROVIDER_SITE_OTHER): Payer: 59 | Admitting: Internal Medicine

## 2015-01-18 ENCOUNTER — Other Ambulatory Visit (INDEPENDENT_AMBULATORY_CARE_PROVIDER_SITE_OTHER): Payer: 59 | Admitting: *Deleted

## 2015-01-18 VITALS — BP 110/60 | HR 60 | Temp 98.7°F | Resp 12 | Wt 237.0 lb

## 2015-01-18 DIAGNOSIS — E1142 Type 2 diabetes mellitus with diabetic polyneuropathy: Secondary | ICD-10-CM

## 2015-01-18 DIAGNOSIS — E1140 Type 2 diabetes mellitus with diabetic neuropathy, unspecified: Secondary | ICD-10-CM

## 2015-01-18 DIAGNOSIS — E1165 Type 2 diabetes mellitus with hyperglycemia: Principal | ICD-10-CM

## 2015-01-18 LAB — POCT GLYCOSYLATED HEMOGLOBIN (HGB A1C): Hemoglobin A1C: 9.8

## 2015-01-18 MED ORDER — CANAGLIFLOZIN 100 MG PO TABS: ORAL_TABLET | ORAL | Status: DC

## 2015-01-18 NOTE — Progress Notes (Signed)
Patient ID: Paul [name], male   DOB: February 19, 1946, 69 y.o.   MRN: [id number]  HPI: Paul [name] is a 69 y.o.-year-old male, returning for f/u for DM2, dx in 2001, non-insulin-dependent, uncontrolled, with complications (mild PN, ). Last visit 3 mo ago.  Last hemoglobin A1c was: 09/23/2014: 8.8% Lab Results  Component Value Date   HGBA1C 8.8 05/26/2014   HGBA1C 9.4 03/25/2014   HGBA1C 6.2 10/20/2012   Pt is on a regimen of: - Metformin XR 2000 mg at bedtime - Amaryl 4 mg 2x a day before meals - Victoza 1.8 mg daily in am  Pt checks his sugars 1x a day and they are: - am: 150-200 >> 179-279 - 2h after b'fast: n/c - before lunch: n/c - 2h after lunch: n/c - before dinner: n/c >> 274 - 2h after dinner: n/c - bedtime: n/c >> 243-284 - nighttime: n/c No lows. Lowest sugar was 163; he has hypoglycemia awareness at 70.  Highest sugar was 400s at home.  Glucometer: OneTouch Ultra  Pt's meals are: - Breakfast: cereals + blueberries - Lunch: chinese food or sandwich, fruit - Dinner: meat + veggies or salad - Snacks: no  He used Nutrisystem 2x before >> 40 lbs lost.  - + CKD, last BUN/creatinine:  09/23/2014: 13/1.1, GFR >60 Lab Results  Component Value Date   BUN 23 07/22/2014   CREATININE 1.42* 07/22/2014  On Valsartan. Angioedema with ACEI. - last set of lipids: 09/2014: 158/157/25/100 Lab Results  Component Value Date   CHOL 190 03/25/2014   HDL 35* 03/25/2014   LDLCALC 84 03/25/2014   TRIG 357* 03/25/2014   CHOLHDL 5.4 03/25/2014  On Lipitor. - last eye exam was in 11/2014. No DR. She had a small area of retinal detachment. - + numbness and tingling in his L foot  Patient has a history of trigeminy, status post endocardial ablation at [facility]. He had an epicardial ablation in 08/2014. During that time, he had 2 stents latest, and the ablation procedure was postponed. He is followed by Dr. Dwana Curd.  He was also recently found to have obstructive sleep apnea,  now on CPAP for the last month.  ROS: Constitutional: no weight gain/loss, no fatigue, no subjective hyperthermia/hypothermia, + nocturia Eyes: no blurry vision, no xerophthalmia ENT: no sore throat, no nodules palpated in throat, no dysphagia/odynophagia, no hoarseness Cardiovascular: no CP/SOB/+ palpitations/+ leg swelling Respiratory: no cough/SOB Gastrointestinal: no N/V/D/C Musculoskeletal: no muscle/+ joint aches - knee Skin: no rashes Neurological: no tremors/numbness/tingling/dizziness + diff with erections, + low libido  I reviewed pt's medications, allergies, PMH, social hx, family hx, and changes were documented in the history of present illness. Otherwise, unchanged from my initial visit note; started Androgel:  Past Medical History  Diagnosis Date  . Cataract   . Hypertension   . Hypovitaminosis D   . Hypogonadism male   . Ventricular tachycardia     observed during stress testing, subsequent VT on EPS  . 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 Erectile dysfunction   . CHF (congestive heart failure)     "mild" (06/01/2013)  . Type II diabetes mellitus   . GERD (gastroesophageal reflux disease)   . Arthritis     "thumbs, neck, knees" (06/01/2013)   Past Surgical History  Procedure Laterality Date  . Lumbar laminectomy  ~ 2004  . Vasectomy    . Tonsillectomy and adenoidectomy  1972  . Knee arthroscopy Left 1984  . Cardiac defibrillator placement  05/09/2010    MDT  ICD implanted in [place] by Dr Ky Barban  . Ventricular ablation surgery  06/01/2013  . Appendectomy  1969  . Cataract extraction w/ intraocular lens  implant, bilateral Bilateral   . Cardiac catheterization  04/2010  . Ablation  06-01-2013    PVC's ablated along basal inferoseptal RV by Dr Rayann Heman  . V-tach ablation N/A 06/01/2013    Procedure: V-TACH ABLATION;  Surgeon: Coralyn Mark, MD;  Location: [facility] CATH LAB;  Service: Cardiovascular;  Laterality: N/A;   History   Social History   . Marital Status: Married    Spouse Name: N/A  . Number of Children: 2   Occupational History  . physician   Social History Main Topics  . Smoking status: Never Smoker   . Smokeless tobacco: Never Used  . Alcohol Use: 1.2 oz/week    1 Glasses scotch, rarely  . 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    Moved here from [facility] 1.5 years ago   Current Outpatient Prescriptions on File Prior to Visit  Medication Sig Dispense Refill  . aspirin 325 MG tablet Take 325 mg by mouth daily.    Marland Kitchen atorvastatin (LIPITOR) 40 MG tablet Take 40 mg by mouth daily.    . Blood Glucose Monitoring Suppl (BLOOD GLUCOSE MONITOR SYSTEM) W/DEVICE KIT Glucose monitor to check blood sugars tid dx code E11.9 brand per insurance coverage 1 each 0  . carvedilol (COREG) 6.25 MG tablet Take 1 tablet (6.25 mg total) by mouth 2 (two) times daily. 60 tablet 3  . Cholecalciferol (CVS VIT D 5000 HIGH-POTENCY PO) Take 10,000 Units by mouth daily.    . clopidogrel (PLAVIX) 75 MG tablet Take 1 tablet (75 mg total) by mouth daily. 30 tablet 0  . furosemide (LASIX) 40 MG tablet TAKE 1 TABLET BY MOUTH EVERY MORNING AND 1/2 TABLET EVERY EVENING 90 tablet 2  . glimepiride (AMARYL) 4 MG tablet Take 8 mg by mouth daily with breakfast.    . glucose blood test strip Use  to check blood sugars tid dx code E11.9 brand per insurance coverage 100 each 12  . Insulin Pen Needle (CLICKFINE PEN NEEDLES) 31G X 6 MM MISC For tid injections 100 each 11  . Lancets MISC Glucose monitor to check blood sugars tid dx code E11.9 brand per insurance coverage 100 each 11  . Liraglutide (VICTOZA) 18 MG/3ML SOPN Inject 0.3 mLs (1.8 mg total) into the skin daily. 45 pen 1  . magnesium oxide (MAG-OX) 400 MG tablet Take 1 tablet (400 mg total) by mouth daily. 30 tablet 3  . metFORMIN (GLUCOPHAGE-XR) 500 MG 24 hr tablet TAKE 1 TABLET BY MOUTH 4 TIMES  DAILY 360 tablet 1  . metolazone (ZAROXOLYN) 2.5 MG tablet Take 1 tablet (2.5 mg total) by mouth 2 or 3 times per week for swelling. (Patient not taking: Reported on 10/28/2014) 12 tablet 6  . pravastatin (PRAVACHOL) 20 MG tablet Take 1 tablet (20 mg total) by mouth every evening. (Patient not taking: Reported on 10/28/2014) 90 tablet 3  . rivaroxaban (XARELTO) 20 MG TABS tablet Take 1 tablet (20 mg total) by mouth daily with supper. 90 tablet 1  . sacubitril-valsartan (ENTRESTO) 49-51 MG Take 1 tablet by mouth 2 (two) times daily. 60 tablet 12  . spironolactone (ALDACTONE) 25 MG tablet Take 0.5 tablets (12.5 mg total) by mouth daily. (Patient taking differently: Take 25 mg  by mouth daily. ) 90 tablet 3  . tamsulosin (FLOMAX) 0.4 MG CAPS capsule Take 1 capsule (0.4 mg total) by mouth daily. 90 capsule 3   No current facility-administered medications on file prior to visit.   Allergies  Allergen Reactions  . Lisinopril     angioedema   Family History  Problem Relation Age of Onset  . Alcohol abuse Mother   . COPD Mother   . Depression Mother   . Heart disease Mother   . Hypertension Mother   . Stroke Mother   . COPD Father   . Heart disease Father   . Hypertension Father   . Stroke Father   . Cancer Father   . Diabetes Sister   . Mental retardation Sister   . Learning disabilities Sister   . Hypertension Sister   . Alcohol abuse Brother   . Drug abuse Brother   . Heart disease Brother   . Hypertension Brother   . Coronary artery disease Brother    PE: BP 110/60 mmHg  Pulse 60  Temp(Src) 98.7 F (37.1 C) (Oral)  Resp 12  Wt 237 lb (107.502 kg)  SpO2 97% Body mass index is 34.01 kg/(m^2). Wt Readings from Last 3 Encounters:  01/18/15 237 lb (107.502 kg)  10/28/14 233 lb 9.6 oz (105.96 kg)  07/22/14 236 lb 12 oz (107.389 kg)   Constitutional: overweight, in NAD Eyes: PERRLA, EOMI, no exophthalmos ENT: moist mucous membranes, no thyromegaly, no cervical  lymphadenopathy Cardiovascular: RRR, No MRG, + B LE edema, pitting, L>R (after DVT) Respiratory: CTA B Gastrointestinal: abdomen soft, NT, ND, BS+ Musculoskeletal: no deformities, strength intact in all 4 Skin: moist, warm, no rashes Neurological:  no tremor with outstretched hands, DTR normal in all 4  ASSESSMENT: 1. DM2, non-insulin-dependent, uncontrolled, with complications - mild PN  PLAN:  1. Patient with long-standing, uncontrolled diabetes, on oral antidiabetic regimen + GLP1 R agonist. Sugars higher at this visit, throughout the day, w/o a pattern. At last visit, he only checked sugars in am >>  I was not sure if the problem was not enough mealtime coverage or not enough suppression of gluconeogenesis overnight. I therefore advised him to split his Amaryl into 2 doses, before breakfast and dinner. At this visit, we will try to add Invokana, but I am afraid that we will need basal insulin at next visit. - I suggested to:  Patient Instructions  Please continue: - Metformin XR 2000 mg at bedtime - Victoza 1.8 mg daily in am - Amaryl into 4 mg 2x a day, before meals.  Add: - Invokana 100 mg in am  Please return in 1.5 months with your sugar log.   - we discussed about SEs of Invokana, which are: dizziness (advised to be careful when stands from sitting position), decreased BP - usually not < normal (BP today is not low), and fungal UTIs (advised to let me know if develops one).  - continue checking sugars at different times of the day - check 2 times a day, rotating checks - advised for yearly eye exams >> he is UTD - check hemoglobin A1c today >> 9.8% - Return to clinic in 1.5 mo with sugar log

## 2015-01-18 NOTE — Patient Instructions (Signed)
Please continue: - Metformin XR 2000 mg at bedtime - Victoza 1.8 mg daily in am - Amaryl into 4 mg 2x a day, before meals.  Add: - Invokana 100 mg in am  Please return in 1.5 months with your sugar log.

## 2015-01-23 ENCOUNTER — Other Ambulatory Visit: Payer: Self-pay | Admitting: Emergency Medicine

## 2015-01-26 ENCOUNTER — Ambulatory Visit (INDEPENDENT_AMBULATORY_CARE_PROVIDER_SITE_OTHER): Payer: 59 | Admitting: Ophthalmology

## 2015-01-26 DIAGNOSIS — H33302 Unspecified retinal break, left eye: Secondary | ICD-10-CM

## 2015-02-07 ENCOUNTER — Telehealth (HOSPITAL_COMMUNITY): Payer: Self-pay | Admitting: *Deleted

## 2015-02-07 ENCOUNTER — Ambulatory Visit (HOSPITAL_COMMUNITY)
Admission: RE | Admit: 2015-02-07 | Discharge: 2015-02-07 | Disposition: A | Discharge status: Home / Self Care | Payer: 59 | Source: Ambulatory Visit | Attending: Cardiology | Admitting: Cardiology

## 2015-02-07 VITALS — BP 84/56 | HR 62 | Wt 230.4 lb

## 2015-02-07 DIAGNOSIS — Z95810 Presence of automatic (implantable) cardiac defibrillator: Secondary | ICD-10-CM | POA: Diagnosis not present

## 2015-02-07 DIAGNOSIS — I493 Ventricular premature depolarization: Secondary | ICD-10-CM | POA: Insufficient documentation

## 2015-02-07 DIAGNOSIS — I472 Ventricular tachycardia: Secondary | ICD-10-CM | POA: Diagnosis not present

## 2015-02-07 DIAGNOSIS — K219 Gastro-esophageal reflux disease without esophagitis: Secondary | ICD-10-CM | POA: Diagnosis not present

## 2015-02-07 DIAGNOSIS — N189 Chronic kidney disease, unspecified: Secondary | ICD-10-CM | POA: Diagnosis not present

## 2015-02-07 DIAGNOSIS — I4729 Other ventricular tachycardia: Secondary | ICD-10-CM

## 2015-02-07 DIAGNOSIS — I129 Hypertensive chronic kidney disease with stage 1 through stage 4 chronic kidney disease, or unspecified chronic kidney disease: Secondary | ICD-10-CM | POA: Diagnosis not present

## 2015-02-07 DIAGNOSIS — I429 Cardiomyopathy, unspecified: Secondary | ICD-10-CM | POA: Insufficient documentation

## 2015-02-07 DIAGNOSIS — I5022 Chronic systolic (congestive) heart failure: Secondary | ICD-10-CM | POA: Diagnosis not present

## 2015-02-07 DIAGNOSIS — Z7902 Long term (current) use of antithrombotics/antiplatelets: Secondary | ICD-10-CM | POA: Insufficient documentation

## 2015-02-07 DIAGNOSIS — Z79899 Other long term (current) drug therapy: Secondary | ICD-10-CM | POA: Diagnosis not present

## 2015-02-07 DIAGNOSIS — G4733 Obstructive sleep apnea (adult) (pediatric): Secondary | ICD-10-CM | POA: Diagnosis not present

## 2015-02-07 DIAGNOSIS — E785 Hyperlipidemia, unspecified: Secondary | ICD-10-CM | POA: Insufficient documentation

## 2015-02-07 DIAGNOSIS — Z7982 Long term (current) use of aspirin: Secondary | ICD-10-CM | POA: Diagnosis not present

## 2015-02-07 DIAGNOSIS — Z86718 Personal history of other venous thrombosis and embolism: Secondary | ICD-10-CM | POA: Insufficient documentation

## 2015-02-07 DIAGNOSIS — I2510 Atherosclerotic heart disease of native coronary artery without angina pectoris: Secondary | ICD-10-CM | POA: Insufficient documentation

## 2015-02-07 DIAGNOSIS — Z823 Family history of stroke: Secondary | ICD-10-CM | POA: Diagnosis not present

## 2015-02-07 DIAGNOSIS — E1122 Type 2 diabetes mellitus with diabetic chronic kidney disease: Secondary | ICD-10-CM | POA: Diagnosis not present

## 2015-02-07 DIAGNOSIS — Z8249 Family history of ischemic heart disease and other diseases of the circulatory system: Secondary | ICD-10-CM | POA: Insufficient documentation

## 2015-02-07 LAB — CBC
HCT: 40.9 % (ref 39.0–52.0)
Hemoglobin: 13.5 g/dL (ref 13.0–17.0)
MCH: 30.4 pg (ref 26.0–34.0)
MCHC: 33 g/dL (ref 30.0–36.0)
MCV: 92.1 fL (ref 78.0–100.0)
PLATELETS: 242 10*3/uL (ref 150–400)
RBC: 4.44 MIL/uL (ref 4.22–5.81)
RDW: 13.7 % (ref 11.5–15.5)
WBC: 7.6 10*3/uL (ref 4.0–10.5)

## 2015-02-07 LAB — BRAIN NATRIURETIC PEPTIDE: B Natriuretic Peptide: 27.6 pg/mL (ref 0.0–100.0)

## 2015-02-07 LAB — LIPID PANEL
CHOL/HDL RATIO: 3.6 ratio
CHOLESTEROL: 107 mg/dL (ref 0–200)
HDL: 30 mg/dL — AB (ref >40)
LDL Cholesterol: 32 mg/dL (ref 0–99)
TRIGLYCERIDES: 224 mg/dL — AB (ref <150)
VLDL: 45 mg/dL — ABNORMAL HIGH (ref 0–40)

## 2015-02-07 LAB — BASIC METABOLIC PANEL
Anion gap: 11 (ref 5–15)
BUN: 32 mg/dL — ABNORMAL HIGH (ref 6–20)
CO2: 27 mmol/L (ref 22–32)
CREATININE: 1.91 mg/dL — AB (ref 0.61–1.24)
Calcium: 9.4 mg/dL (ref 8.9–10.3)
Chloride: 96 mmol/L — ABNORMAL LOW (ref 101–111)
GFR calc Af Amer: 40 mL/min — ABNORMAL LOW (ref >60)
GFR calc non Af Amer: 35 mL/min — ABNORMAL LOW (ref >60)
GLUCOSE: 335 mg/dL — AB (ref 65–99)
Potassium: 5.5 mmol/L — ABNORMAL HIGH (ref 3.5–5.1)
Sodium: 134 mmol/L — ABNORMAL LOW (ref 135–145)

## 2015-02-07 MED ORDER — FUROSEMIDE 40 MG PO TABS: 20.0000 mg | ORAL_TABLET | Freq: Every day | ORAL | Status: DC

## 2015-02-07 NOTE — Addendum Note (Signed)
Addended by: Scarlette Calico on: 02/07/2015 05:40 PM   Modules accepted: Orders

## 2015-02-07 NOTE — Telephone Encounter (Signed)
Notes Recorded by Scarlette Calico, RN on 02/07/2015 at 5:08 PM Pt aware and agreeable, order placed for pt to have labs done on Fri

## 2015-02-07 NOTE — Patient Instructions (Signed)
Decrease Furosemide (Lasix) to 20 mg daily  Stop Aspirin  Labs today  We will contact you in 3 months to schedule your next appointment.

## 2015-02-07 NOTE — Progress Notes (Signed)
Patient ID: Paul [name], male   DOB: May 19, 1946, 69 y.o.   MRN: [id number] PCP: Dr. Everlene Farrier Referring MD: Dr. 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 Patient has had a cardiomyopathy known since around 2011.  Cath at that time showed nonobstructive CAD but EF 25%.  He got a Medtronic ICD.  Initial workup was in [place], and he subsequently moved to [place].  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    He has been noted to have frequent PVCs, at one point up to 1/3 of beats.  VT ablation did not help much, but mexiletine seemed to cut back on PVC frequency.  When he last seen by Dr. Caryl Comes, Mexiletine was stopped. Dr Ouida Sills felt like his PVCs increased again.  He was seen at [facility] for possible VT ablation.  During that visit, his ICD was interrogated and PVCs were noted to be up to > 1000/hr with short runs NSVT.  Echo was repeated in 1/16 and showed EF down to 30-35%.  Also of note, patient had an untriggered DVT in 12/15 and is now on Xarelto. He was diagnosed with OSA and is on CPAP.   Since last visit, he was seen by [facility] again for consideration of redo VT ablation.  He had VT mapping in 4/16, mapped to inferior septum.  He had LHC the next day, showing 90% distal LAD and 90% PDA.  He had DES to both lesions.  He was sent home on ASA 325, Plavix 75, and Xarelto 20.  Plan was to return 1 year after DES placement to have epicardial VT ablation (off Plavix).    Symptoms are stable to improved.  He thinks that going on CPAP has helped more than anything else, he is much less fatigued.  No orthopnea/PND.  No exertional dyspnea.  No chest pain (has never had chest pain).  BP low today, usually runs in [id number] systolic.  Very mild lightheadedness with standing up fast today.  Weight is down 6 lbs.   Optivol was checked, shows fluid index well below threshold with high impedance.     Labs (10/15): K 4.7,  creatinine 1.6, LDL 84, HDL 35 Labs (12/15): K 4.8, creatinine 1.23 Labs (2/16): K 4.5, creatinine 1.4  ECG: NSR, RBBB, LAFB, PVC, inferior Qs  PMH: 1. Nonischemic cardiomyopathy: Diagnosed in [place] in 2011.  Possibly related to frequent PVCs. Medtronic ICD placed in 2011.  Echo (6/14) with EF 35%.  Echo (10/15) with EF 40-45% (difficult with PVCs), mildly dilated RV with moderately decreased RV systolic function.  Echo (1/16) with EF 30-35%, mildly dilated LV.   2. PVCs: Frequent, at one point up to 1/3 of total beats.  Patient had endocardial ablation in 6/14 that was not successful.  However, PVC count dropped with initiation of mexiletine.  He is now off mexiletine.  Seen at [facility], plan for epicardial ablation.  3. HTN 4. Type II diabetes 5. CAD:  LHC (1/11) with 40-50% mLAD stenosis, 90% small OM1 stenosis, EF 25%.  LHC (4/16) witih 90% dLAD and 90% PDA, both treated with DES.  6. GERD 7. Appendectomy 8. Lumbar laminectomy 9. CKD 10. Hyperlipidemia: Myalgias with Crestor.  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  12. 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  FH: Father with MI in his 17s and CVA (smoker), mother with PAD diagnosed in her 14s.   ROS: All  systems reviewed and negative except as per HPI.   Current Outpatient Prescriptions  Medication Sig Dispense Refill  . atorvastatin (LIPITOR) 40 MG tablet Take 40 mg by mouth daily.    . Blood Glucose Monitoring Suppl (BLOOD GLUCOSE MONITOR SYSTEM) W/DEVICE KIT Glucose monitor to check blood sugars tid dx code E11.9 brand per insurance coverage 1 each 0  . canagliflozin (INVOKANA) 100 MG TABS tablet Take 1 tablet daily in am 30 tablet 2  . carvedilol (COREG) 6.25 MG tablet Take 1 tablet (6.25 mg total) by mouth 2 (two) times daily. 60 tablet 3  . Cholecalciferol (CVS VIT D 5000 HIGH-POTENCY PO) Take 10,000 Units by mouth daily.    . clopidogrel (PLAVIX) 75 MG tablet Take 1 tablet (75 mg total) by mouth  daily. 30 tablet 0  . furosemide (LASIX) 40 MG tablet Take 0.5 tablets (20 mg total) by mouth daily. 30 tablet   . glimepiride (AMARYL) 4 MG tablet TAKE 2 TABLETS BY MOUTH DAILY BEFORE BREAKFAST 180 tablet 1  . glucose blood test strip Use  to check blood sugars tid dx code E11.9 brand per insurance coverage 100 each 12  . Insulin Pen Needle (CLICKFINE PEN NEEDLES) 31G X 6 MM MISC For tid injections 100 each 11  . Lancets MISC Glucose monitor to check blood sugars tid dx code E11.9 brand per insurance coverage 100 each 11  . Liraglutide (VICTOZA) 18 MG/3ML SOPN Inject 0.3 mLs (1.8 mg total) into the skin daily. 45 pen 1  . magnesium oxide (MAG-OX) 400 MG tablet Take 1 tablet (400 mg total) by mouth daily. 30 tablet 3  . metFORMIN (GLUCOPHAGE-XR) 500 MG 24 hr tablet Take 2,000 mg by mouth daily with breakfast.    . rivaroxaban (XARELTO) 20 MG TABS tablet Take 1 tablet (20 mg total) by mouth daily with supper. 90 tablet 1  . sacubitril-valsartan (ENTRESTO) 49-51 MG Take 1 tablet by mouth 2 (two) times daily. 60 tablet 12  . spironolactone (ALDACTONE) 25 MG tablet Take 25 mg by mouth daily.    . tamsulosin (FLOMAX) 0.4 MG CAPS capsule Take 1 capsule (0.4 mg total) by mouth daily. 90 capsule 3   No current facility-administered medications for this encounter.   BP 84/56 mmHg  Pulse 62  Wt 230 lb 6.4 oz (104.509 kg)  SpO2 99% General: NAD Neck: JVP 7 cm, no thyromegaly or thyroid nodule.  Lungs: Clear to auscultation bilaterally with normal respiratory effort. CV: Nondisplaced PMI.  Heart regular S1/S2, no S3/S4, no murmur.  Trace ankle edema.  No carotid bruit.  Normal pedal pulses.  Abdomen: Soft, nontender, no hepatosplenomegaly, no distention.  Skin: Intact without lesions or rashes.  Neurologic: Alert and oriented x 3.  Psych: Normal affect. Extremities: No clubbing or cyanosis.  HEENT: Normal.   Assessment/Plan:  1. Chronic systolic CHF: Nonischemic cardiomyopathy.  Possibly  PVC-related as there seemed to have been some improvement in EF with decreasing PVC burdern on mexiletine (EF 40-45%).  With more PVCs off mexiletine, EF down to 30-35%.  He had PCI in 4/16 to distal LAD and PDA, but suspect that his cardiomyopathy is not predominantly ischemic (pre-dated coronary disease and out of proportion to coronary disease).  NYHA II symptoms, he is not volume overloaded.    - BP low, not volume overloaded.  Decrease Lasix to 20 mg daily.  - Continue current Coreg, spironolactone, and Entresto. - BMET/BNP today => if creatinine high, will come off Lasix altogether.   2. 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 Epicardial PVC ablation planned at [facility] after he has completed 1 year of Plavix post-PCI.  3. CKD:  BMET today.  4. CAD: Nonobstructive CAD on 2011 cath but 4/16 cath at [facility] with distal LAD and PDA stenoses treated with DES.  As above, do not think coronary disease can explain extent of his cardiomyopathy.  He is currently on ASA 325, Plavix 75, and Xarelto. - Stop aspirin. - Continue on Plavix + Xarelto x 1 year, will stop Plavix at 1 year.  - Continue statin, check lipids today.  5. DVT: Spontaneous (no trigger).  He will be on Xarelto long-term.  Check CBC today.   Loralie Champagne 02/07/2015

## 2015-02-07 NOTE — Telephone Encounter (Signed)
-----   Message from Larey Dresser, MD sent at 02/07/2015  5:06 PM EDT ----- Stop Lasix, stop spironolactone, repeat BMET on Friday. Stay hydrated.

## 2015-02-17 ENCOUNTER — Other Ambulatory Visit: Payer: Self-pay | Admitting: *Deleted

## 2015-02-17 DIAGNOSIS — I5022 Chronic systolic (congestive) heart failure: Secondary | ICD-10-CM

## 2015-02-17 NOTE — Progress Notes (Signed)
Lab only visit. Future order released for BMP

## 2015-02-22 ENCOUNTER — Encounter (INDEPENDENT_AMBULATORY_CARE_PROVIDER_SITE_OTHER): Payer: 59 | Admitting: Ophthalmology

## 2015-02-22 DIAGNOSIS — H43812 Vitreous degeneration, left eye: Secondary | ICD-10-CM

## 2015-02-22 DIAGNOSIS — H33302 Unspecified retinal break, left eye: Secondary | ICD-10-CM

## 2015-03-06 ENCOUNTER — Ambulatory Visit (INDEPENDENT_AMBULATORY_CARE_PROVIDER_SITE_OTHER): Payer: 59 | Admitting: Internal Medicine

## 2015-03-06 ENCOUNTER — Encounter: Payer: Self-pay | Admitting: Internal Medicine

## 2015-03-06 VITALS — BP 118/80 | HR 59 | Ht 69.5 in | Wt 230.6 lb

## 2015-03-06 DIAGNOSIS — I420 Dilated cardiomyopathy: Secondary | ICD-10-CM

## 2015-03-06 DIAGNOSIS — T82110A Breakdown (mechanical) of cardiac electrode, initial encounter: Secondary | ICD-10-CM | POA: Insufficient documentation

## 2015-03-06 DIAGNOSIS — I429 Cardiomyopathy, unspecified: Secondary | ICD-10-CM

## 2015-03-06 DIAGNOSIS — I5022 Chronic systolic (congestive) heart failure: Secondary | ICD-10-CM

## 2015-03-06 HISTORY — DX: Breakdown (mechanical) of cardiac electrode, initial encounter: T82.110A

## 2015-03-06 LAB — CUP PACEART INCLINIC DEVICE CHECK
Brady Statistic AP VS Percent: 38.8 %
Brady Statistic AS VP Percent: 0.1 % — CL
Date Time Interrogation Session: 20160919162942
HIGH POWER IMPEDANCE MEASURED VALUE: 43 Ohm
Lead Channel Impedance Value: 399 Ohm
Lead Channel Impedance Value: 418 Ohm
Lead Channel Pacing Threshold Pulse Width: 0.4 ms
Lead Channel Pacing Threshold Pulse Width: 0.4 ms
Lead Channel Sensing Intrinsic Amplitude: 1.1 mV
Lead Channel Sensing Intrinsic Amplitude: 2.6 mV
Lead Channel Setting Sensing Sensitivity: 0.3 mV
MDC IDC MSMT LEADCHNL RA PACING THRESHOLD AMPLITUDE: 0.75 V
MDC IDC MSMT LEADCHNL RV PACING THRESHOLD AMPLITUDE: 0.75 V
MDC IDC SET LEADCHNL RA PACING AMPLITUDE: 2 V
MDC IDC SET LEADCHNL RV PACING AMPLITUDE: 2.5 V
MDC IDC SET LEADCHNL RV PACING PULSEWIDTH: 0.4 ms
MDC IDC SET ZONE DETECTION INTERVAL: 320 ms
MDC IDC STAT BRADY AP VP PERCENT: 0.1 % — AB
MDC IDC STAT BRADY AS VS PERCENT: 61.1 %
Zone Setting Detection Interval: 350 ms
Zone Setting Detection Interval: 360 ms
Zone Setting Detection Interval: 400 ms

## 2015-03-06 NOTE — Progress Notes (Signed)
Patient Care Team: Darlyne Russian, MD as PCP - General (Family Medicine) Darlyne Russian, MD (Family Medicine)   HPI  Paul [name] is a 69 y.o. male Seen in followup for ICD implanted November 2001. He had a history of presyncope and left ventricular dysfunction Ejection fraction was noted to be about 20-30%. 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 6/14 He underwent ablation and mapping Dr Greggory Brandy which was unfortunately unsuccessful, with thoughts perhaps related to epicardial focus  Since last visit, he was seen by [facility] again for consideration of redo VT ablation. He had VT mapping in 4/16, mapped to inferior septum. He had LHC the next day, showing 90% distal LAD and 90% PDA. He had DES to both lesions. He was sent home on ASA 325, Plavix 75, and Xarelto 20. Plan was to return 1 year after DES placement to have epicardial VT ablation (off Plavix).    Renal insufficiency continues to be an issue and his diuretics have been decreased.  Ventricular ectopy continues also to be an issue he will be following up with [facility] in March.  He is seen today because of a diminution in his R waves detected by remote monitoring. Sensed bipolar R wave was about 2 mV.       Past Medical History  Diagnosis Date  . Cataract   . Hypertension   . Hypovitaminosis D   . Hypogonadism male   . Ventricular tachycardia     observed during stress testing, subsequent VT on EPS  . 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 Erectile dysfunction   . CHF (congestive heart failure)     "mild" (06/01/2013)  . Type II diabetes mellitus   . GERD (gastroesophageal reflux disease)   . Arthritis     "thumbs, neck, knees" (06/01/2013)    Past Surgical History  Procedure Laterality Date  .  Lumbar laminectomy  ~ 2004  . Vasectomy    . Tonsillectomy and adenoidectomy  1972  . Knee arthroscopy Left 1984  . Cardiac defibrillator placement  05/09/2010    MDT ICD implanted in [place] by Dr Ky Barban  . Ventricular ablation surgery  06/01/2013  . Appendectomy  1969  . Cataract extraction w/ intraocular lens  implant, bilateral Bilateral   . Cardiac catheterization  04/2010  . Ablation  06-01-2013    PVC's ablated along basal inferoseptal RV by Dr Rayann Heman  . V-tach ablation N/A 06/01/2013    Procedure: V-TACH ABLATION;  Surgeon: Coralyn Mark, MD;  Location: [facility] CATH LAB;  Service: Cardiovascular;  Laterality: N/A;    Current Outpatient Prescriptions  Medication Sig Dispense Refill  . atorvastatin (LIPITOR) 40 MG tablet Take 40 mg by mouth daily.    . Blood Glucose Monitoring Suppl (BLOOD GLUCOSE MONITOR SYSTEM) W/DEVICE KIT Glucose monitor to check blood sugars tid dx code E11.9 brand per insurance coverage 1 each 0  . canagliflozin (INVOKANA) 100 MG TABS tablet Take 1 tablet daily in am 30 tablet 2  . carvedilol (COREG) 6.25 MG tablet Take 1 tablet (6.25 mg total) by mouth 2 (two) times daily. 60 tablet 3  . Cholecalciferol (CVS VIT D 5000 HIGH-POTENCY PO) Take 10,000 Units by mouth daily.    . clopidogrel (PLAVIX) 75 MG tablet Take 1 tablet (75 mg total) by  mouth daily. 30 tablet 0  . glimepiride (AMARYL) 4 MG tablet TAKE 2 TABLETS BY MOUTH DAILY BEFORE BREAKFAST 180 tablet 1  . glucose blood test strip Use  to check blood sugars tid dx code E11.9 brand per insurance coverage 100 each 12  . Insulin Pen Needle (CLICKFINE PEN NEEDLES) 31G X 6 MM MISC For tid injections 100 each 11  . Lancets MISC Glucose monitor to check blood sugars tid dx code E11.9 brand per insurance coverage 100 each 11  . Liraglutide (VICTOZA) 18 MG/3ML SOPN Inject 0.3 mLs (1.8 mg total) into the skin daily. 45 pen 1  . magnesium oxide (MAG-OX) 400 MG tablet Take 1 tablet (400 mg total) by mouth daily. 30  tablet 3  . metFORMIN (GLUCOPHAGE-XR) 500 MG 24 hr tablet Take 2,000 mg by mouth daily with breakfast.    . rivaroxaban (XARELTO) 20 MG TABS tablet Take 1 tablet (20 mg total) by mouth daily with supper. 90 tablet 1  . sacubitril-valsartan (ENTRESTO) 49-51 MG Take 1 tablet by mouth 2 (two) times daily. 60 tablet 12  . tamsulosin (FLOMAX) 0.4 MG CAPS capsule Take 1 capsule (0.4 mg total) by mouth daily. 90 capsule 3   No current facility-administered medications for this visit.    Allergies  Allergen Reactions  . Lisinopril     angioedema    Review of Systems negative except from HPI and PMH  Physical Exam BP 118/80 mmHg  Pulse 59  Ht 5' 9.5" (1.765 m)  Wt 230 lb 9.6 oz (104.599 kg)  BMI 33.58 kg/m2 Well developed and well nourished in no acute distress HENT normal E scleral and icterus clear Neck Supple JVP flat; carotids brisk and full Clear to ausculation Irregular rate and rhythm no murmurs gallops or rub Device pocket well healed; without hematoma or erythema.  There is no tethering Soft with active bowel sounds No clubbing cyanosis2+L>R Edema Alert and oriented, grossly normal motor and sensory function Skin Warm and Dry    Assessment and  Plan  PVCs/Ventricular tach-nonsustained   Cardiomyopathy-NICM  Coronary artery disease status post stenting [facility] 2016  Lightheadedness/hypotension   CHF chronic Systolic improved  ICD-Medtronic  The patient's device was interrogated.  The information was reviewed. No changes were made in the programming.    Diminished R-wave  The patient has a bipolar R wave of 2 mV. There is integrated bipolar measurement of about 5 mV. We have been in discussion with Medtronic. The recommendation is that if the integrated bipolar vector is to be used DFT testing should be undertaken. This seems imminently reasonable. We will try to also understand whether there are data regarding its utility  The [facility] records were reviewed regarding  the anticipated ablation next spring.

## 2015-03-07 ENCOUNTER — Ambulatory Visit (INDEPENDENT_AMBULATORY_CARE_PROVIDER_SITE_OTHER): Payer: 59 | Admitting: Internal Medicine

## 2015-03-07 ENCOUNTER — Encounter: Payer: Self-pay | Admitting: Internal Medicine

## 2015-03-07 VITALS — BP 118/68 | HR 71 | Temp 97.9°F | Resp 12 | Wt 220.0 lb

## 2015-03-07 DIAGNOSIS — E1140 Type 2 diabetes mellitus with diabetic neuropathy, unspecified: Secondary | ICD-10-CM

## 2015-03-07 DIAGNOSIS — E1165 Type 2 diabetes mellitus with hyperglycemia: Principal | ICD-10-CM

## 2015-03-07 DIAGNOSIS — E1142 Type 2 diabetes mellitus with diabetic polyneuropathy: Secondary | ICD-10-CM

## 2015-03-07 LAB — BASIC METABOLIC PANEL
BUN: 30 mg/dL — ABNORMAL HIGH (ref 6–23)
CO2: 28 mEq/L (ref 19–32)
CREATININE: 1.7 mg/dL — AB (ref 0.40–1.50)
Calcium: 9.4 mg/dL (ref 8.4–10.5)
Chloride: 99 mEq/L (ref 96–112)
GFR: 42.87 mL/min — ABNORMAL LOW (ref >60.00)
Glucose, Bld: 172 mg/dL — ABNORMAL HIGH (ref 70–99)
Potassium: 4.3 mEq/L (ref 3.5–5.1)
Sodium: 138 mEq/L (ref 135–145)

## 2015-03-07 MED ORDER — CANAGLIFLOZIN 300 MG PO TABS: 300.0000 mg | ORAL_TABLET | Freq: Every day | ORAL | Status: DC

## 2015-03-07 NOTE — Progress Notes (Signed)
Patient ID: Paul [name], male   DOB: [birth year], 69 y.o.   MRN: [id number]  HPI: Paul [name] is a 69 y.o.-year-old male, returning for f/u for DM2, dx in 2001, non-insulin-dependent, uncontrolled, with complications (mild PN, ED). Last visit 1.5 mo ago.  Last hemoglobin A1c was: Lab Results  Component Value Date   HGBA1C 9.8 01/18/2015   HGBA1C 8.8 05/26/2014   HGBA1C 9.4 03/25/2014  09/23/2014: 8.8%  Pt is on a regimen of: - Metformin XR 2000 mg at bedtime - Amaryl 4 mg 2x a day before meals - Victoza 1.8 mg daily in am - Invokana 300 mg daily in am (he increased this by himself from 100 mg)  Pt checks his sugars 1x a day and they are: - am: 150-200 >> 179-279 >> 130-203, 220 - better after increasing Invokana - 2h after b'fast: n/c - before lunch: n/c - 2h after lunch: n/c - before dinner: n/c >> 274 >> n/c - 2h after dinner: n/c - bedtime: n/c >> 243-284 >> n/c - nighttime: n/c No lows. Lowest sugar was 163 >> 130; he has hypoglycemia awareness at 70.  Highest sugar was 400s >> 200s at home.  Glucometer: OneTouch Ultra  Pt's meals are: - Breakfast: cereals + blueberries - Lunch: chinese food or sandwich, fruit - Dinner: meat + veggies or salad - Snacks: no  He used Nutrisystem 2x before >> 40 lbs lost.   - + CKD, last BUN/creatinine -high >> stopped Spironolactone and Lasix due to high K (5.5) and Cr. He then restarted Lasix every other day.:  Lab Results  Component Value Date   BUN 32* 02/07/2015   CREATININE 1.91* 02/07/2015  09/23/2014: 13/1.1, GFR >60 Angioedema with ACEI. - last set of lipids: Lab Results  Component Value Date   CHOL 107 02/07/2015   HDL 30* 02/07/2015   LDLCALC 32 02/07/2015   TRIG 224* 02/07/2015   CHOLHDL 3.6 02/07/2015  09/2014: 158/157/25/100 On Lipitor. - last eye exam was in 11/2014. No DR. She had a small area of retinal detachment. Dr Rodena Piety performed Laser Sx >> had a small L vitreal detachment after this >> may  need vitrectomy - + numbness and tingling in his L foot  Patient has a history of trigeminy, status post endocardial ablation at [facility]. He had an epicardial ablation in 08/2014. During that time, he had 2 stents latest, and the ablation procedure was postponed. He is followed by Dr. Dwana Curd. He has obstructive sleep apnea, now on CPAP. H/o DVT L leg >> more swollen.  ROS: Constitutional: + weight loss, no fatigue, no subjective hyperthermia/hypothermia, + nocturia Eyes: no blurry vision, no xerophthalmia ENT: no sore throat, no nodules palpated in throat, no dysphagia/odynophagia, no hoarseness Cardiovascular: no CP/SOB/+ palpitations/+ leg swelling Respiratory: no cough/SOB Gastrointestinal: + N/no V/D/C Musculoskeletal: no muscle/joint aches Skin: no rashes Neurological: no tremors/numbness/tingling/dizziness + diff with erections, + low libido  I reviewed pt's medications, allergies, PMH, social hx, family hx, and changes were documented in the history of present illness. Otherwise, unchanged from my initial visit note:  Past Medical History  Diagnosis Date  . Cataract   . Hypertension   . Hypovitaminosis D   . Hypogonadism male   . Ventricular tachycardia     observed during stress testing, subsequent VT on EPS  . 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 Erectile dysfunction   . CHF (congestive heart failure)     "mild" (06/01/2013)  . Type II diabetes mellitus   .  GERD (gastroesophageal reflux disease)   . Arthritis     "thumbs, neck, knees" (06/01/2013)  . 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 Lumbar laminectomy  ~ 2004  . Vasectomy    . Tonsillectomy and adenoidectomy  1972  . Knee arthroscopy Left 1984  . Cardiac defibrillator placement  05/09/2010    MDT ICD implanted in [place] by Dr Ky Barban  . Ventricular ablation surgery  06/01/2013  . Appendectomy   1969  . Cataract extraction w/ intraocular lens  implant, bilateral Bilateral   . Cardiac catheterization  04/2010  . Ablation  06-01-2013    PVC's ablated along basal inferoseptal RV by Dr Rayann Heman  . V-tach ablation N/A 06/01/2013    Procedure: V-TACH ABLATION;  Surgeon: Coralyn Mark, MD;  Location: [facility] CATH LAB;  Service: Cardiovascular;  Laterality: N/A;   History   Social History  . Marital Status: Married    Spouse Name: N/A  . Number of Children: 2   Occupational History  . physician   Social History Main Topics  . Smoking status: Never Smoker   . Smokeless tobacco: Never Used  . Alcohol Use: 1.2 oz/week    1 Glasses scotch, rarely  . 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    Moved here from [facility] [place] 1.5 years ago   Current Outpatient Prescriptions on File Prior to Visit  Medication Sig Dispense Refill  . atorvastatin (LIPITOR) 40 MG tablet Take 40 mg by mouth daily.    . Blood Glucose Monitoring Suppl (BLOOD GLUCOSE MONITOR SYSTEM) W/DEVICE KIT Glucose monitor to check blood sugars tid dx code E11.9 brand per insurance coverage 1 each 0  . canagliflozin (INVOKANA) 100 MG TABS tablet Take 1 tablet daily in am 30 tablet 2  . carvedilol (COREG) 6.25 MG tablet Take 1 tablet (6.25 mg total) by mouth 2 (two) times daily. 60 tablet 3  . Cholecalciferol (CVS VIT D 5000 HIGH-POTENCY PO) Take 10,000 Units by mouth daily.    . clopidogrel (PLAVIX) 75 MG tablet Take 1 tablet (75 mg total) by mouth daily. 30 tablet 0  . glimepiride (AMARYL) 4 MG tablet TAKE 2 TABLETS BY MOUTH DAILY BEFORE BREAKFAST 180 tablet 1  . glucose blood test strip Use  to check blood sugars tid dx code E11.9 brand per insurance coverage 100 each 12  . Insulin Pen Needle (CLICKFINE PEN NEEDLES) 31G X 6 MM MISC For tid injections 100 each 11  . Lancets MISC Glucose monitor to check blood sugars tid dx code  E11.9 brand per insurance coverage 100 each 11  . Liraglutide (VICTOZA) 18 MG/3ML SOPN Inject 0.3 mLs (1.8 mg total) into the skin daily. 45 pen 1  . magnesium oxide (MAG-OX) 400 MG tablet Take 1 tablet (400 mg total) by mouth daily. 30 tablet 3  . metFORMIN (GLUCOPHAGE-XR) 500 MG 24 hr tablet Take 2,000 mg by mouth daily with breakfast.    . rivaroxaban (XARELTO) 20 MG TABS tablet Take 1 tablet (20 mg total) by mouth daily with supper. 90 tablet 1  . sacubitril-valsartan (ENTRESTO) 49-51 MG Take 1 tablet by mouth 2 (two) times daily. 60 tablet 12  . tamsulosin (FLOMAX) 0.4 MG CAPS capsule Take 1 capsule (0.4 mg total) by mouth daily. 90 capsule 3   No current facility-administered medications on file  prior to visit.   Allergies  Allergen Reactions  . Lisinopril     angioedema   Family History  Problem Relation Age of Onset  . Alcohol abuse Mother   . COPD Mother   . Depression Mother   . Heart disease Mother   . Hypertension Mother   . Stroke Mother   . COPD Father   . Heart disease Father   . Hypertension Father   . Stroke Father   . Cancer Father   . Diabetes Sister   . Mental retardation Sister   . Learning disabilities Sister   . Hypertension Sister   . Alcohol abuse Brother   . Drug abuse Brother   . Heart disease Brother   . Hypertension Brother   . Coronary artery disease Brother    PE: BP 118/68 mmHg  Pulse 71  Temp(Src) 97.9 F (36.6 C) (Oral)  Resp 12  Wt 220 lb (99.791 kg)  SpO2 97% Body mass index is 32.03 kg/(m^2). Wt Readings from Last 3 Encounters:  03/07/15 220 lb (99.791 kg)  03/06/15 230 lb 9.6 oz (104.599 kg)  02/07/15 230 lb 6.4 oz (104.509 kg)   Constitutional: overweight, in NAD Eyes: PERRLA, EOMI, no exophthalmos ENT: moist mucous membranes, no thyromegaly, no cervical lymphadenopathy Cardiovascular: RRR, No MRG, + B LE edema, pitting, L>R (after DVT) Respiratory: CTA B Gastrointestinal: abdomen soft, NT, ND, BS+ Musculoskeletal: no  deformities, strength intact in all 4 Skin: moist, warm, no rashes Neurological:  no tremor with outstretched hands, DTR normal in all 4  ASSESSMENT: 1. DM2, non-insulin-dependent, uncontrolled, with complications - mild PN - ED  PLAN:  1. Patient with long-standing, uncontrolled diabetes, on oral antidiabetic regimen + GLP1 R agonist. At last visit, we added Invokana >> sugars better, especially in the lat week after he increased the dose to 300 mg daily. However, his K was high and Cr was also high >> I explained that if the potassium and GFR are still abnormal (will check today), we need to decrease the dose or even stop Invokana. If we do, the next step is to add basal Insulin. He agrees with this. - I suggested to:  Patient Instructions  Please continue: - Metformin XR 2000 mg at bedtime - Victoza 1.8 mg daily in am - Amaryl into 4 mg 2x a day, before meals. - Invokana 300 mg in am  Please return in 1.5 months with your sugar log.   Please stop at the lab.   - advised for yearly eye exams >> he is UTD - last hemoglobin A1c was high, at 9.8% - Return to clinic in 1.5 mo with sugar log   Component     Latest Ref Rng 03/07/2015  Sodium     135 - 145 mEq/L 138  Potassium     3.5 - 5.1 mEq/L 4.3  Chloride     96 - 112 mEq/L 99  CO2     19 - 32 mEq/L 28  Glucose     70 - 99 mg/dL 172 (H)  BUN     6 - 23 mg/dL 30 (H)  Creatinine     0.40 - 1.50 mg/dL 1.70 (H)  Calcium     8.4 - 10.5 mg/dL 9.4  GFR     >60.00 mL/min 42.87 (L)   GFR improved. Potassium not high. For now, will continue the same doses of metformin and Invokana and I'll advise the patient to stay very well hydrated >> will recheck BMP  when he returns. We'll hold off insulin for now.

## 2015-03-07 NOTE — Patient Instructions (Addendum)
Please continue: - Metformin XR 2000 mg at bedtime - Victoza 1.8 mg daily in am - Amaryl into 4 mg 2x a day, before meals. - Invokana 300 mg in am  Please return in 1.5 months with your sugar log.   Please stop at the lab.

## 2015-03-09 NOTE — Progress Notes (Signed)
He certainly wants to stay off of insulin to. He has some major cardiac issues being followed jointly here and at [facility].

## 2015-03-21 ENCOUNTER — Encounter: Payer: Self-pay | Admitting: Emergency Medicine

## 2015-03-23 ENCOUNTER — Encounter: Payer: Self-pay | Admitting: Internal Medicine

## 2015-04-05 ENCOUNTER — Encounter (INDEPENDENT_AMBULATORY_CARE_PROVIDER_SITE_OTHER): Payer: 59 | Admitting: Ophthalmology

## 2015-05-09 ENCOUNTER — Other Ambulatory Visit (HOSPITAL_COMMUNITY): Payer: Self-pay | Admitting: Cardiology

## 2015-05-09 ENCOUNTER — Other Ambulatory Visit: Payer: Self-pay | Admitting: *Deleted

## 2015-05-09 MED ORDER — CARVEDILOL 6.25 MG PO TABS: 6.2500 mg | ORAL_TABLET | Freq: Two times a day (BID) | ORAL | Status: DC

## 2015-05-11 IMAGING — CR DG CHEST 2V
2 series · 2 of 2 positions shown · non-contrast
Comparison: September 19, 2013

CLINICAL DATA: One month history of cough and congestion ;
difficulty breathing

EXAM:
CHEST  2 VIEW

[chest pa]
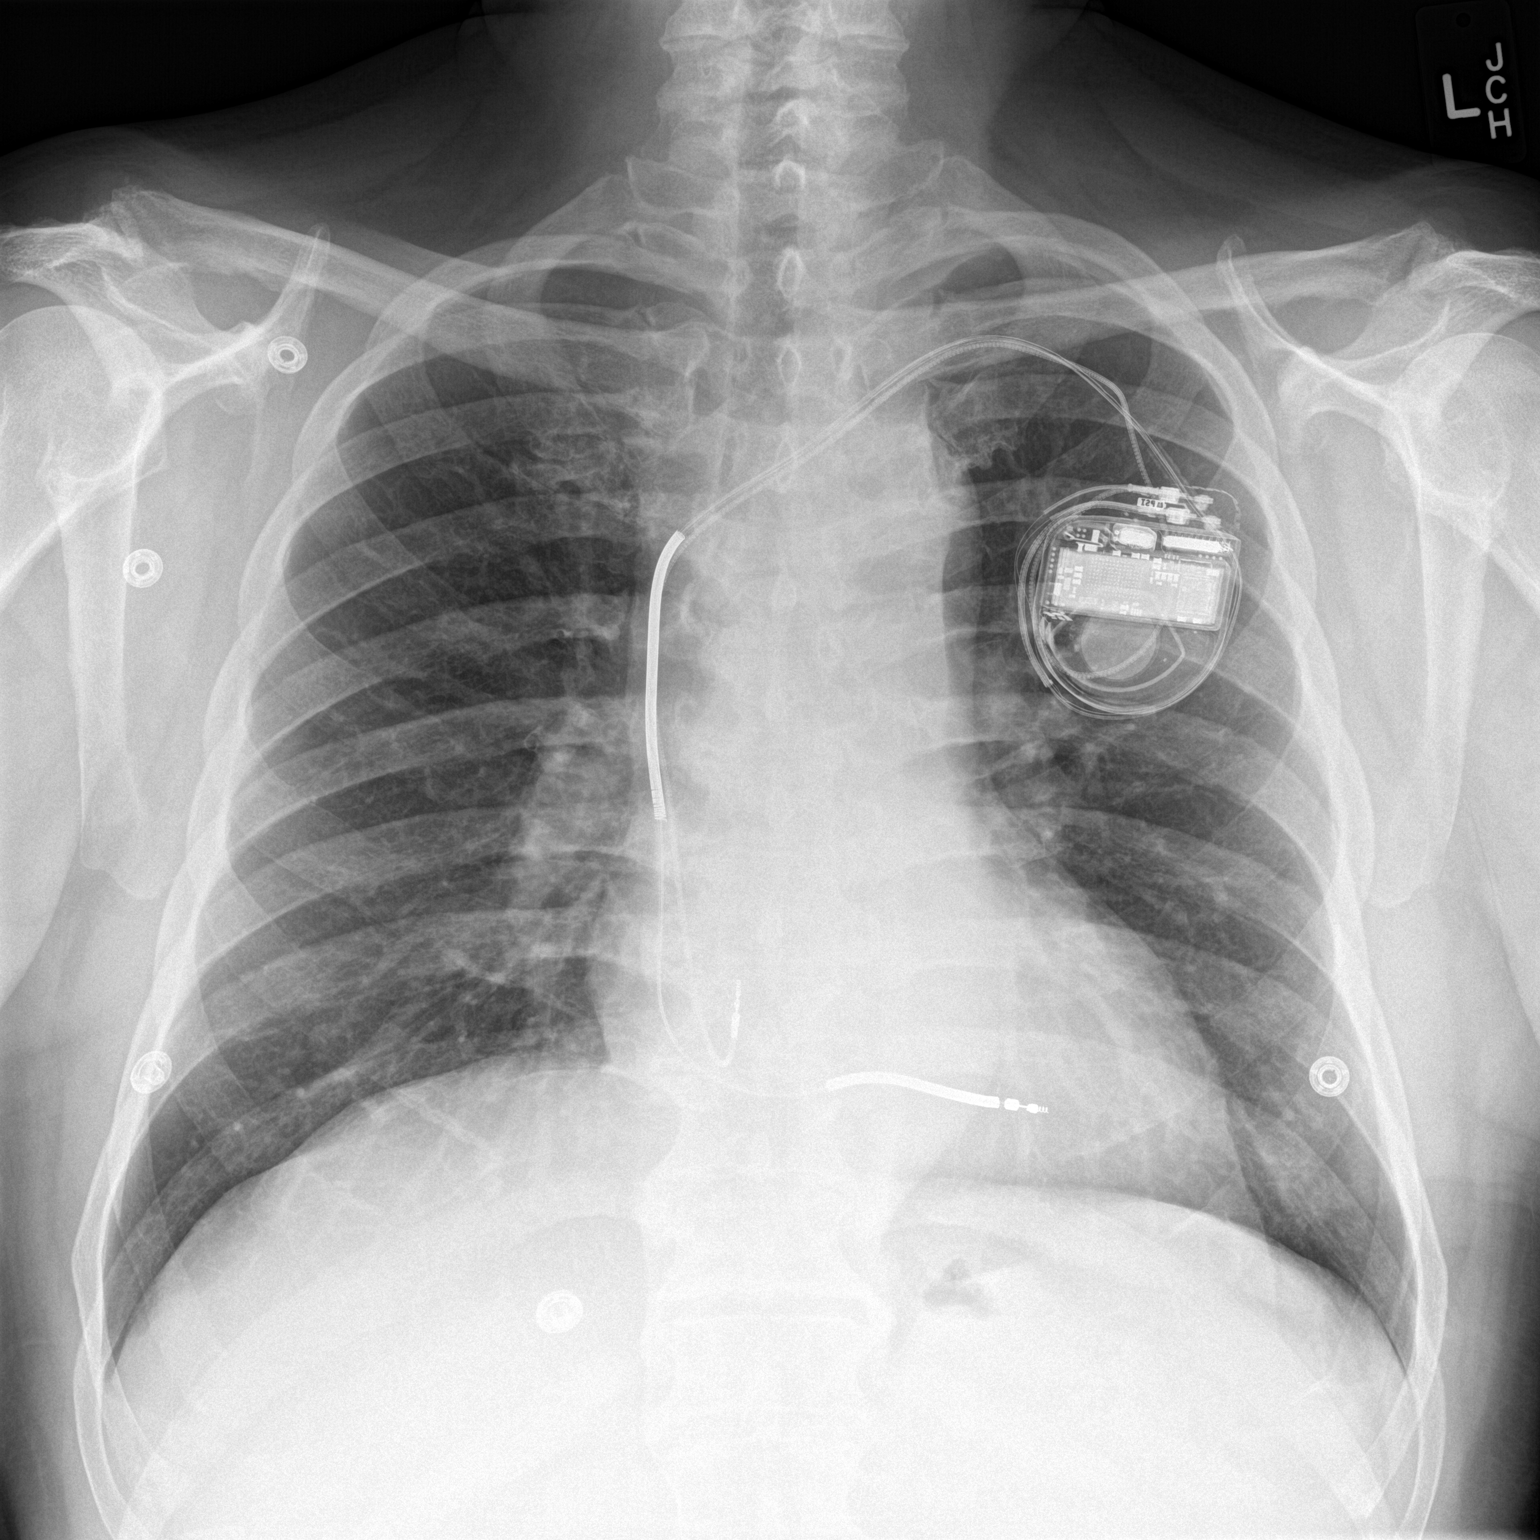

[chest lat]
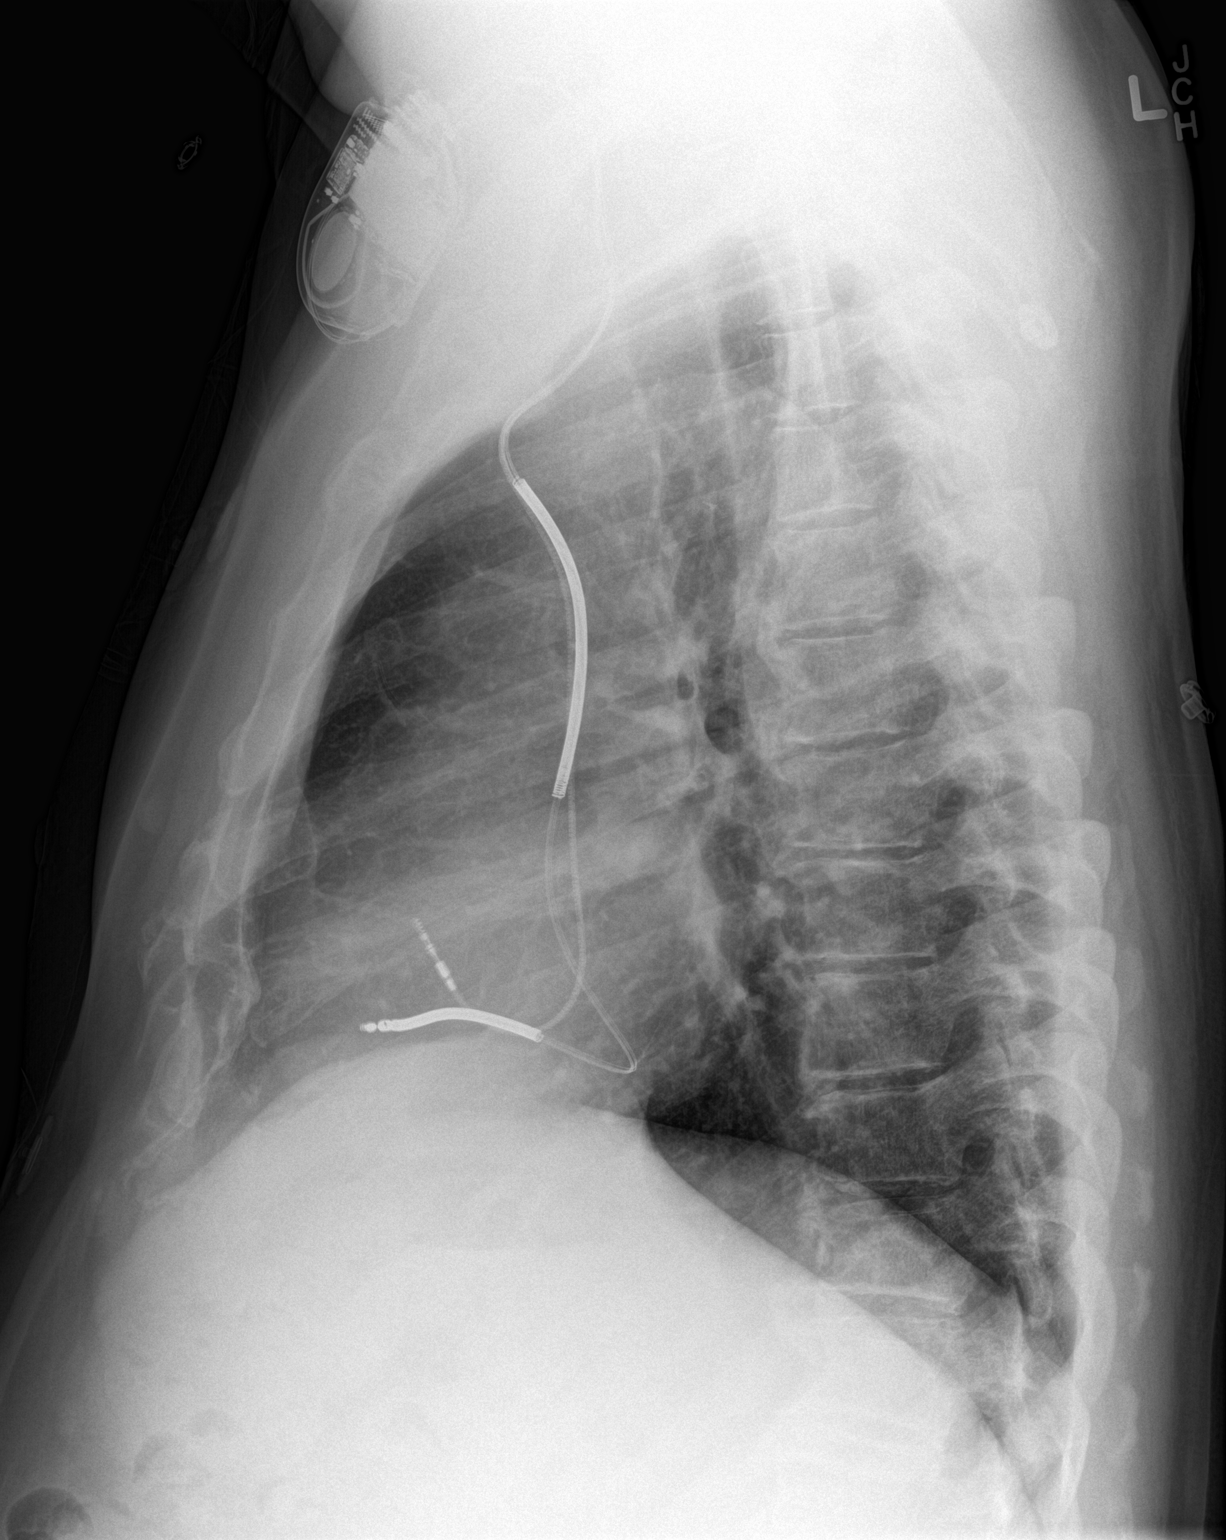

[2 of 2 positions shown; findings below may reference images not displayed]

FINDINGS: There is no edema or consolidation. The heart size and pulmonary
vascularity are normal. Pacemaker lead tips are attached to the
right atrium and right ventricle. No adenopathy. No pneumothorax.
There is mild degenerative change in the thoracic spine.
IMPRESSION: No edema or consolidation.

## 2015-05-29 ENCOUNTER — Ambulatory Visit (INDEPENDENT_AMBULATORY_CARE_PROVIDER_SITE_OTHER): Payer: 59 | Admitting: Ophthalmology

## 2015-06-07 ENCOUNTER — Encounter (HOSPITAL_COMMUNITY): Payer: Self-pay | Admitting: Vascular Surgery

## 2015-06-13 ENCOUNTER — Encounter: Payer: Self-pay | Admitting: *Deleted

## 2015-06-16 ENCOUNTER — Other Ambulatory Visit: Payer: Self-pay

## 2015-06-16 DIAGNOSIS — E1142 Type 2 diabetes mellitus with diabetic polyneuropathy: Secondary | ICD-10-CM

## 2015-06-16 DIAGNOSIS — E1165 Type 2 diabetes mellitus with hyperglycemia: Principal | ICD-10-CM

## 2015-06-16 MED ORDER — CANAGLIFLOZIN 300 MG PO TABS: 300.0000 mg | ORAL_TABLET | Freq: Every day | ORAL | Status: DC

## 2015-06-20 ENCOUNTER — Ambulatory Visit (INDEPENDENT_AMBULATORY_CARE_PROVIDER_SITE_OTHER): Payer: 59 | Admitting: Family Medicine

## 2015-06-20 VITALS — BP 130/74 | HR 67 | Temp 97.4°F | Resp 16 | Ht 71.0 in | Wt 229.4 lb

## 2015-06-20 DIAGNOSIS — M545 Low back pain, unspecified: Secondary | ICD-10-CM

## 2015-06-20 DIAGNOSIS — R109 Unspecified abdominal pain: Secondary | ICD-10-CM | POA: Diagnosis not present

## 2015-06-20 LAB — POC MICROSCOPIC URINALYSIS (UMFC): Mucus: ABSENT

## 2015-06-20 LAB — POCT URINALYSIS DIP (MANUAL ENTRY)
BILIRUBIN UA: NEGATIVE
Leukocytes, UA: NEGATIVE
NITRITE UA: NEGATIVE
Protein Ur, POC: NEGATIVE
RBC UA: NEGATIVE
Spec Grav, UA: 1.01
Urobilinogen, UA: 0.2
pH, UA: 5

## 2015-06-20 NOTE — Patient Instructions (Signed)
Your repeat urinalysis looks clean. I know you got some hematuria earlier. However it probably is not a stone, and I would recommend giving a little bit of time. Check for blood in the urine over the next few days a few more times and if you keep seeing blood intermittently we will do further testing.  Take  Tramadol as needed

## 2015-06-20 NOTE — Progress Notes (Signed)
Patient ID: Paul [name], male    DOB: 02/08/1946  Age: 70 y.o. MRN: [id number]  Chief Complaint  Patient presents with  . Nephrolithiasis    Subjective:   Patient is here for suspected kidney stones. He is been having pain over the last week or 10 days and his left flank intermittently. It is been bothering him since last night. He took tramadol last night and got some relief and rest. It is hurting again today. He checked the urine dip and it was positive for blood so he assumed that he had a kidney stone. He does have a history of diabetes. The Invokana gives him dysuria.   Current allergies, medications, problem list, past/family and social histories reviewed.  Objective:  BP 130/74 mmHg  Pulse 67  Temp(Src) 97.4 F (36.3 C) (Oral)  Resp 16  Ht 5\' 11"  (1.803 m)  Wt 229 lb 6.4 oz (104.055 kg)  BMI 32.01 kg/m2  SpO2 93%  Mild left CVA tenderness. Good range of motion of his spine which does not seem tender. Results for orders placed or performed in visit on 06/20/15  POCT urinalysis dipstick  Result Value Ref Range   Color, UA yellow yellow   Clarity, UA clear clear   Glucose, UA >=1,000 (A) negative   Bilirubin, UA negative negative   Ketones, POC UA trace (5) (A) negative   Spec Grav, UA 1.010    Blood, UA negative negative   pH, UA 5.0    Protein Ur, POC negative negative   Urobilinogen, UA 0.2    Nitrite, UA Negative Negative   Leukocytes, UA Negative Negative  POCT Microscopic Urinalysis ([facility])  Result Value Ref Range   WBC,UR,HPF,POC None None WBC/hpf   RBC,UR,HPF,POC None None RBC/hpf   Bacteria None None, Too numerous to count   Mucus Absent Absent   Epithelial Cells, UR Per Microscopy Few (A) None, Too numerous to count cells/hpf    Assessment & Plan:   Assessment: 1. Flank pain   2. Left-sided low back pain without sciatica       Plan: Appears to not have a kidney stone based on the fact that he does not have blood in his urine now. He is to  check on this and if it persists he will get Further.  Orders Placed This Encounter  Procedures  . POCT urinalysis dipstick  . POCT Microscopic Urinalysis ([facility])    No orders of the defined types were placed in this encounter.         Patient Instructions   Your repeat urinalysis looks clean. I know you got some hematuria earlier. However it probably is not a stone, and I would recommend giving a little bit of time. Check for blood in the urine over the next few days a few more times and if you keep seeing blood intermittently we will do further testing.  Take  Tramadol as needed     Return if symptoms worsen or fail to improve.   Rylee Huestis, MD 06/20/2015

## 2015-06-30 ENCOUNTER — Other Ambulatory Visit (INDEPENDENT_AMBULATORY_CARE_PROVIDER_SITE_OTHER): Payer: 59 | Admitting: *Deleted

## 2015-06-30 ENCOUNTER — Encounter: Payer: Self-pay | Admitting: Internal Medicine

## 2015-06-30 ENCOUNTER — Ambulatory Visit (INDEPENDENT_AMBULATORY_CARE_PROVIDER_SITE_OTHER): Payer: 59 | Admitting: Internal Medicine

## 2015-06-30 ENCOUNTER — Other Ambulatory Visit: Payer: Self-pay | Admitting: Emergency Medicine

## 2015-06-30 VITALS — BP 110/62 | HR 66 | Temp 98.3°F | Resp 12 | Wt 227.6 lb

## 2015-06-30 DIAGNOSIS — E1165 Type 2 diabetes mellitus with hyperglycemia: Secondary | ICD-10-CM | POA: Diagnosis not present

## 2015-06-30 DIAGNOSIS — E1142 Type 2 diabetes mellitus with diabetic polyneuropathy: Secondary | ICD-10-CM

## 2015-06-30 DIAGNOSIS — E291 Testicular hypofunction: Secondary | ICD-10-CM | POA: Diagnosis not present

## 2015-06-30 LAB — POCT GLYCOSYLATED HEMOGLOBIN (HGB A1C): HEMOGLOBIN A1C: 8.1

## 2015-06-30 MED ORDER — INSULIN PEN NEEDLE 32G X 4 MM MISC: Status: DC

## 2015-06-30 MED ORDER — INSULIN GLARGINE 100 UNIT/ML SOLOSTAR PEN: 12.0000 [IU] | PEN_INJECTOR | Freq: Every day | SUBCUTANEOUS | Status: DC

## 2015-06-30 MED FILL — CLOPIDOGREL 75 MG TABLET: 75 | 90 days supply | Qty: 90 | Fill #3

## 2015-06-30 MED FILL — ATORVASTATIN 40 MG TABLET: 40 | 90 days supply | Qty: 90 | Fill #3

## 2015-06-30 MED FILL — NOVOFINE 32G NEEDLES: 32G X 6 MM | 90 days supply | Qty: 200 | Fill #0

## 2015-06-30 MED FILL — LANTUS SOLOSTAR 100 UNITS/M: 100 | 90 days supply | Qty: 15 | Fill #0

## 2015-06-30 NOTE — Patient Instructions (Addendum)
Please stop at the lab.  Please continue: - Metformin XR 2000 mg at bedtime - Victoza 1.8 mg daily in am - Amaryl into 4 mg 2x a day, before meals. - Invokana 300 mg in am  Please add Lantus 12 units at bedtime. In 3 days, if sugars in am not <150, increase to 15 units. You may also need to increase further, to 18 units in another 3 days.   When injecting insulin:  Inject in the abdomen  Rotate the injection sites around the belly button  Change needle for each injection  Keep needle in for 10 sec after last unit of insulin in  Keep the insulin in use out of the fridge  Check sugars at bedtime 2-3x a week.  Please return in 1.5 months with your sugar log.

## 2015-06-30 NOTE — Progress Notes (Addendum)
Patient ID: Paul [name], male   DOB: 1945-12-12, 70 y.o.   MRN: [id number]  HPI: Paul [name] is a 70 y.o.-year-old male, returning for f/u for DM2, dx in 2001, uncontrolled, with complications (CKD, mild PN, ED). Last visit 4 mo ago.  DM2: Last hemoglobin A1c was: Lab Results  Component Value Date   HGBA1C 9.8 01/18/2015   HGBA1C 8.8 05/26/2014   HGBA1C 9.4 03/25/2014  09/23/2014: 8.8%  Pt is on a regimen of: - Metformin XR 2000 mg at bedtime - Amaryl 4 mg 2x a day before meals - Victoza 1.8 mg daily in am - Invokana 300 mg daily in am (he increased this by himself from 100 mg) He refused insulin.  Pt checks his sugars 1x a day and they are: - am: 150-200 >> 179-279 >> 130-203, 220 - better after increasing Invokana >> 136-180, 210 - 2h after b'fast: n/c - before lunch: n/c - 2h after lunch: n/c - before dinner: n/c >> 274 >> n/c - 2h after dinner: n/c - bedtime: n/c >> 243-284 >> n/c - nighttime: n/c No lows. Lowest sugar was 163 >> 130 >> 100 when sick and could not eat; he has hypoglycemia awareness at 70.  Highest sugar was 400s >> 200s.  Glucometer: OneTouch Ultra  Pt's meals are: - Breakfast: cereals + blueberries - Lunch: chinese food or sandwich, fruit - Dinner: meat + veggies or salad - Snacks: no  He used Nutrisystem 2x before >> 40 lbs lost.   - + CKD, last BUN/creatinine -high >> stopped Spironolactone and Lasix due to high K (5.5) and Cr. He is now on Lasix every other day:  Lab Results  Component Value Date   BUN 30* 03/07/2015   CREATININE 1.70* 03/07/2015  09/23/2014: 13/1.1, GFR >60 Angioedema with ACEI. - last set of lipids: Lab Results  Component Value Date   CHOL 107 02/07/2015   HDL 30* 02/07/2015   LDLCALC 32 02/07/2015   TRIG 224* 02/07/2015   CHOLHDL 3.6 02/07/2015  09/2014: 158/157/25/100 On Lipitor. - last eye exam was in 11/2014. No DR. She had a small area of retinal detachment. Dr Rodena Piety performed Laser Sx >> had a  small L vitreal detachment after this >> may need vitrectomy.  - + numbness and tingling in his L foot  He has a h/o hypogonadism, prev. On Androgel. As he noted some mm mass loss >> would like to restart. Reviewed testosterone level from 2014: low Lab Results  Component Value Date   TESTOSTERONE 215* 10/16/2012    Patient has a history of trigeminy, status post endocardial ablation at [facility]. He had an epicardial ablation in 08/2014. During that time, he had 2 stents latest, and the ablation procedure was postponed. He is followed by Dr. Dwana Curd. He has obstructive sleep apnea, now on CPAP. H/o DVT L leg >> more swollen.  ROS: Constitutional: + weight loss, no fatigue, no subjective hyperthermia/hypothermia, + nocturia Eyes: no blurry vision, no xerophthalmia ENT: no sore throat, no nodules palpated in throat, no dysphagia/odynophagia, no hoarseness Cardiovascular: no CP/SOB/palpitations/+ leg swelling Respiratory: no cough/SOB Gastrointestinal: no N/V/D/C Musculoskeletal: no muscle/+ joint aches Skin: no rashes Neurological: no tremors/numbness/tingling/dizziness + diff with erections  I reviewed pt's medications, allergies, PMH, social hx, family hx, and changes were documented in the history of present illness. Otherwise, unchanged from my initial visit note:  Past Medical History  Diagnosis Date  . Cataract   . Hypertension   . Hypovitaminosis D   .  Hypogonadism male   . Ventricular tachycardia ([place])     observed during stress testing, subsequent VT on EPS  . 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 Erectile dysfunction   . CHF (congestive heart failure) ([place])     "mild" (06/01/2013)  . Type II diabetes mellitus ([place])   . GERD (gastroesophageal reflux disease)   . Arthritis     "thumbs, neck, knees" (06/01/2013)  . 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  Lumbar laminectomy  ~ 2004  . Vasectomy    . Tonsillectomy and adenoidectomy  1972  . Knee arthroscopy Left 1984  . Cardiac defibrillator placement  05/09/2010    MDT ICD implanted in [place] by Dr Ky Barban  . Ventricular ablation surgery  06/01/2013  . Appendectomy  1969  . Cataract extraction w/ intraocular lens  implant, bilateral Bilateral   . Cardiac catheterization  04/2010  . Ablation  06-01-2013    PVC's ablated along basal inferoseptal RV by Dr Rayann Heman  . V-tach ablation N/A 06/01/2013    Procedure: V-TACH ABLATION;  Surgeon: Coralyn Mark, MD;  Location: [facility] CATH LAB;  Service: Cardiovascular;  Laterality: N/A;   History   Social History  . Marital Status: Married    Spouse Name: N/A  . Number of Children: 2   Occupational History  . physician   Social History Main Topics  . Smoking status: Never Smoker   . Smokeless tobacco: Never Used  . Alcohol Use: 1.2 oz/week    1 Glasses scotch, rarely  . 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    Moved here from [facility] 1.5 years ago   Current Outpatient Prescriptions on File Prior to Visit  Medication Sig Dispense Refill  . atorvastatin (LIPITOR) 40 MG tablet Take 40 mg by mouth daily.    . Blood Glucose Monitoring Suppl (BLOOD GLUCOSE MONITOR SYSTEM) W/DEVICE KIT Glucose monitor to check blood sugars tid dx code E11.9 brand per insurance coverage 1 each 0  . canagliflozin (INVOKANA) 300 MG TABS tablet Take 300 mg by mouth daily before breakfast. 30 tablet 2  . carvedilol (COREG) 6.25 MG tablet Take 1 tablet (6.25 mg total) by mouth 2 (two) times daily. 60 tablet 3  . Cholecalciferol ([employer] VIT D 5000 HIGH-POTENCY PO) Take 10,000 Units by mouth daily.    . clopidogrel (PLAVIX) 75 MG tablet Take 1 tablet (75 mg total) by mouth daily. 30 tablet 0  . glimepiride (AMARYL) 4 MG tablet TAKE 2 TABLETS BY MOUTH DAILY BEFORE  BREAKFAST 180 tablet 1  . glucose blood test strip Use  to check blood sugars tid dx code E11.9 brand per insurance coverage 100 each 12  . Insulin Pen Needle (CLICKFINE PEN NEEDLES) 31G X 6 MM MISC For tid injections 100 each 11  . Lancets MISC Glucose monitor to check blood sugars tid dx code E11.9 brand per insurance coverage 100 each 11  . Liraglutide (VICTOZA) 18 MG/3ML SOPN Inject 0.3 mLs (1.8 mg total) into the skin daily. 45 pen 1  . magnesium oxide (MAG-OX) 400 MG tablet Take 1 tablet (400 mg total) by mouth daily. 30 tablet 3  . metFORMIN (GLUCOPHAGE-XR) 500 MG 24 hr tablet Take 2,000 mg by mouth daily with breakfast.    . rivaroxaban (XARELTO) 20 MG TABS tablet Take 1 tablet (  20 mg total) by mouth daily with supper. 90 tablet 1  . sacubitril-valsartan (ENTRESTO) 49-51 MG Take 1 tablet by mouth 2 (two) times daily. 60 tablet 12  . tamsulosin (FLOMAX) 0.4 MG CAPS capsule Take 1 capsule (0.4 mg total) by mouth daily. 90 capsule 3   No current facility-administered medications on file prior to visit.   Allergies  Allergen Reactions  . Lisinopril     angioedema   Family History  Problem Relation Age of Onset  . Alcohol abuse Mother   . COPD Mother   . Depression Mother   . Heart disease Mother   . Hypertension Mother   . Stroke Mother   . COPD Father   . Heart disease Father   . Hypertension Father   . Stroke Father   . Cancer Father   . Diabetes Sister   . Mental retardation Sister   . Learning disabilities Sister   . Hypertension Sister   . Alcohol abuse Brother   . Drug abuse Brother   . Heart disease Brother   . Hypertension Brother   . Coronary artery disease Brother    PE: BP 110/62 mmHg  Pulse 66  Temp(Src) 98.3 F (36.8 C) (Oral)  Resp 12  Wt 227 lb 9.6 oz (103.239 kg)  SpO2 97% Body mass index is 31.76 kg/(m^2). Wt Readings from Last 3 Encounters:  06/30/15 227 lb 9.6 oz (103.239 kg)  06/20/15 229 lb 6.4 oz (104.055 kg)  03/07/15 220 lb (99.791 kg)    Constitutional: overweight, in NAD Eyes: PERRLA, EOMI, no exophthalmos ENT: moist mucous membranes, no thyromegaly, no cervical lymphadenopathy Cardiovascular: RRR, No MRG, + B LE edema, pitting, L>R (after DVT) Respiratory: CTA B Gastrointestinal: abdomen soft, NT, ND, BS+ Musculoskeletal: no deformities, strength intact in all 4 Skin: moist, warm, no rashes Neurological:  no tremor with outstretched hands, DTR normal in all 4  ASSESSMENT: 1. DM2, non-insulin-dependent, uncontrolled, with complications - CKD - mild PN - ED  2. Hypogonadism  PLAN:  1. Patient with long-standing, uncontrolled diabetes, on oral antidiabetic regimen + GLP1 R agonist + Invokana >> sugars better, but not at goal. He only checks in am >> advised to also check later in the day, especially at bedtime. We have reached the limits for po antidiabetic regimen at this point, especially in the setting of his CKD. He agtrees and accepts basal insulin >> will start Lantus low dose and advance as needed. Demonstrated correct inj technique. - I suggested to:  Patient Instructions  Please stop at the lab.  Please continue: - Metformin XR 2000 mg at bedtime - Victoza 1.8 mg daily in am - Amaryl into 4 mg 2x a day, before meals. - Invokana 300 mg in am  Please add Lantus 12 units at bedtime. In 3 days, if sugars in am not <150, increase to 15 units. You may also need to increase further, to 18 units in another 3 days.   When injecting insulin:  Inject in the abdomen  Rotate the injection sites around the belly button  Change needle for each injection  Keep needle in for 10 sec after last unit of insulin in  Keep the insulin in use out of the fridge  Check sugars at bedtime 2-3x a week.  Please return in 1.5 months with your sugar log.   - advised for yearly eye exams >> he is UTD - will check a BMP today - last hemoglobin A1c was high, at 9.8% >> recheck  today: 8.1% (much better!) - Return to clinic  in 1.5 mo with sugar log   2. Hypogonadism - he was previously on Androgel, would like to restart as he feels he had some muscle mass loss  - reviewed previous total testosterone from 2014: low: Lab Results  Component Value Date   TESTOSTERONE 215* 10/16/2012  - will recheck a total and a free testosterone today. He is fasting.  Orders Only on 06/30/2015  Component Date Value Ref Range Status  . Hemoglobin A1C 06/30/2015 8.1   Final  Office Visit on 06/30/2015  Component Date Value Ref Range Status  . Testosterone 06/30/2015 181* 250 - 827 ng/dL Final   Comment: ** Please note change in reference range(s). **   In hypogonadal males, Testosterone, Total,LC/MS/MS is the recommended assay due to the diminished accuracy of immunoassay at levels below 250 ng/dL.  This test code [id number]) must be collected in a red-top tube with no gel.  Two morning (8 - 10 AM) specimens obtained on different days are recommended by The Endocrine Society for screening for hypogonadism.   Mar[place] Kitchen Sex Hormone Binding 06/30/2015 25  22 - 77 nmol/L Final  . Testosterone, Free 06/30/2015 39.8* 47.0 - 244.0 pg/mL Final   Comment:   The concentration of free testosterone is derived from a mathematical expression based on constants for the binding of testosterone to sex hormone-binding globulin and albumin.   . Testosterone-% Free 06/30/2015 2.2  1.6 - 2.9 % Final   Total and free testosterone levels are low. I called and discussed with the patient and he mentions that he did not have previous investigation of his low testosterone. Looking back in his chart, I can see a diagnosis of hypogonadism dating back at least to 2014, but no additional testing other than the previously low testosterone. I discussed with the patient to repeat the testosterone along with pituitary labs, estrogen, beta hCG, and IBC panel. He agrees to have them done at 8 in the morning, fasting.   Orders Placed This Encounter  Procedures  .  Testosterone, Free, Total, SHBG  . ACTH  . Cortisol  . T3, free  . T4, free  . TSH  . Prolactin  . Luteinizing hormone  . Follicle stimulating hormone  . Insulin-like growth factor  . Testosterone, Free, Total, SHBG  . Beta HCG, Quant (tumor marker)  . IBC Panel  . PSA  . Hepatic Function Panel  . Estradiol   Called and d/w pt >> will start Androgel 5g daily - see test result details below:  [facility] [address] [place], [place] [address] Phone: [phone] Fax: [phone]   July 13, 2015   [place] [address]   Dear Paul [name],  Below are the results from your recent visit:  Resulted Orders  ACTH  Result Value Ref Range   C206 ACTH 32 6 - 50 pg/mL     Comment:     Reference range applies only to specimens collected between 7am-10am    Narrative   Performed at: [facility] [place] [address]  Cortisol  Result Value Ref Range   Cortisol, Plasma 17.7 ug/dL     Comment:      AM: 4.3 - 22.4 ug/dL PM: 3.1 - 16.7 ug/dL    Narrative   Performed at: [address]  [place], [place] [address]  T3, free  Result Value Ref Range  T3, Free 2.6 2.3 - 4.2 pg/mL   Narrative   Performed at: [facility]  [address]  [place], [place] [address]  T4, free  Result Value Ref Range   Free T4 1.40 0.80 - 1.80 ng/dL   Narrative   Performed at: [facility]  [address]  [place], [place] [address]  TSH  Result Value Ref Range   TSH 1.674 0.350 - 4.500 uIU/mL   Narrative   Performed at: [address]  [place], [place]  [address]  Prolactin  Result Value Ref Range   Prolactin 5.2 2.1 - 17.1 ng/mL     Comment:      Reference Ranges:  Male: 2.1 - 17.1 ng/ml  Male: Pregnant 9.7 - 208.5 ng/mL  Non Pregnant 2.8 - 29.2 ng/mL  Post Menopausal 1.8 - 20.3 ng/mL      Narrative   Performed at: [facility]  [address]  [place], [place] [address]  Luteinizing hormone  Result Value Ref Range   LH 11.8 (H) 1.5 - 9.3 mIU/mL     Comment:     Reference Ranges:  Male: 20 - 70 Years 1.5 - 9.3 mIU/mL  > 70 Years 3.1 - 34.6 mIU/mL  Male: Follicular Phase 1.9 - 17.6 mIU/mL  Midcycle 8.7 - 76.3 mIU/mL  Luteal Phase 0.5 - 16.9 mIU/mL  Post Menopausal 15.9 - 54.0 mIU/mL  Pregnant < 1.5 mIU/mL  Contraceptives 0.7 - 5.6 mIU/mL  Children: < 6.0 mIU/mL     Narrative   Performed at: [place], [address]  [place], [place] [address]  Follicle stimulating hormone  Result Value Ref Range   FSH 13.5 1.4 - 18.1 mIU/mL     Comment:     Reference Ranges:  Male: 1.4 - 18.1 mIU/mL  Male: Follicular Phase 2.5 - 62.6 mIU/mL  MidCycle Peak 3.4 - 33.4 mIU/mL  Luteal Phase 1.5 - 9.1 mIU/mL  Post Menopausal 23.0 - 116.3 mIU/mL  Pregnant < 0.3 mIU/mL    Narrative   Performed at: [address], [address]  [place], [place] [address]  Insulin-like growth factor  Result Value Ref Range   IGF-I, LC/MS 88 41 - 279 ng/mL   Z-Score (Male) -0.5 -2.0-+2.0 SD     Comment:      This test was developed and its analytical performance characteristics have been determined by [facility]. It has not been cleared or approved by FDA. This assay has been validated pursuant to the CLIA regulations and is used for clinical purposes.     Narrative   Performed at: [place], [place] [address]  Testosterone, Free, Total, SHBG  Result Value Ref Range   Testosterone 220 (L) 250 - 827 ng/dL     Comment:     ** Please note change in reference range(s). **  In hypogonadal males, Testosterone, Total,LC/MS/MS is the recommended assay due to the diminished accuracy of immunoassay at levels below 250 ng/dL. This test code [phone]) must be collected in a red-top tube with no gel. Two morning (8 - 10 AM) specimens obtained on different days are recommended by The Endocrine Society for screening for hypogonadism.    Sex Hormone Binding 33 22 - 77 nmol/L   Testosterone, Free 42.1 (L) 47.0 - 244.0 pg/mL     Comment:  The concentration of free testosterone is derived from a mathematical expression based on constants for the binding of testosterone to sex hormone-binding globulin and albumin.    Testosterone-% Free 1.9 1.6 - 2.9 %   Narrative   Performed at: [facility]  [address]  [place], [place] [address]  Beta HCG, Quant (tumor marker)  Result Value Ref Range   Beta hCG, Tumor Marker 2.2 <5.0 mIU/mL     Comment:       Reference Range:  Males: <5 mIU/mL  Females:  Nonpregnant: <5 mIU/mL   Values from different assay methods may vary. The use of this  assay to monitor or to diagnose patients with cancer or any  condition other than pregnancy has not been approved by the FDA  of the manufacturer of the assay.   This HCG assay is performed on the [employer]  platform. Use of an alternative HCG assay provides corroboration  potentially useful in distinguising true versus false elevations  of HCG. To further rule out false elevations of HCG, also  consider measurement of HCG after pretreatment for human  anti-animal [mouse] antibody (HCG, Total, with HAMA treatment),  order code 19720.    Narrative   Performed at: [facility] Jerral Bonito, MD, [place], Laboratory Director  [place], [place] [address]  IBC Panel  Result Value Ref Range   UIBC 309 125 - 400 ug/dL   TIBC 345 250 - 425 ug/dL   %SAT 10 (L) 15 - 60 %   Narrative   Performed at: [facility] [place], [address]  [place], [place] [address]  PSA  Result Value Ref Range   PSA 1.50 <=4.00 ng/mL     Comment:     Test Methodology: ECLIA PSA (Electrochemiluminescence Immunoassay)  For PSA values from 2.5-4.0, particularly in younger men <71 years old, the AUA and NCCN suggest testing for % Free PSA (3515) and evaluation of the rate of increase in PSA (PSA velocity).    Narrative   Performed at: [facility]  [address]  [place], [place] [address]  Hepatic Function Panel  Result Value Ref Range   Total Bilirubin 0.6 0.2 - 1.2 mg/dL   Bilirubin, Direct 0.1 <=0.2 mg/dL   Indirect Bilirubin 0.5 0.2 - 1.2 mg/dL   Alkaline Phosphatase 80 40 - 115 U/L   AST 23 10 - 35 U/L   ALT 17 9 - 46 U/L   Total Protein 6.8 6.1  - 8.1 g/dL   Albumin 4.4 3.6 - 5.1 g/dL   Narrative   Performed at: [facility]  [address]  [place], [place] [address]  Estradiol  Result Value Ref Range   Estradiol, Free 0.74 (H) ADULTS: < OR = 0.45 pg/mL   Estradiol 33 (H) ADULTS: < OR = 29 pg/mL     Comment:      This test was developed and its analytical performance characteristics have been determined by [facility]. It has not been cleared or approved by FDA. This assay has been validated pursuant to the CLIA regulations and is used for clinical purposes.    Narrative   Performed at: [address]  [place], [place] [address]  Iron  Result Value Ref Range   Iron 36 (L) 50 - 180 ug/dL   Narrative   Performed at: [facility]  [address]  [place], [place] [address]   As we discussed over the phone, your testosterone level is still low, and only a little bit better than before. The LH and [place] are not appropriately increased for this level of testosterone, confirming hypogonadotropic hypogonadism.  Your estrogen level is a little high, and I would like to keep an eye on this.  Your iron studies showed that you have a low iron and low iron saturation. Please consider having this investigated further.  Your pituitary- adrenal axis appears normal, based on the normal ACTH and cortisol levels.  Your pituitary-thyroid axis also appears normal: TSH, free T4, free T3 levels are excellent.  Your PSA, beta hCG, prolactin, and IGF1, and liver tests are all normal.  I will send AndroGel 5 mg to your pharmacy - please start applying it once a day, probably best at bedtime.   If you have any questions or concerns, please don't hesitate to call me.  Sincerely, Salena Saner

## 2015-07-03 LAB — TESTOSTERONE, FREE, TOTAL, SHBG
SEX HORMONE BINDING: 25 nmol/L (ref 22–77)
Testosterone, Free: 39.8 pg/mL — ABNORMAL LOW (ref 47.0–244.0)
Testosterone-% Free: 2.2 % (ref 1.6–2.9)
Testosterone: 181 ng/dL — ABNORMAL LOW (ref 250–827)

## 2015-07-04 MED FILL — CELECOXIB 200 MG CAPSULE: 200 | 90 days supply | Qty: 180 | Fill #0

## 2015-07-06 ENCOUNTER — Other Ambulatory Visit (INDEPENDENT_AMBULATORY_CARE_PROVIDER_SITE_OTHER): Payer: 59 | Admitting: *Deleted

## 2015-07-06 ENCOUNTER — Other Ambulatory Visit: Payer: Self-pay | Admitting: *Deleted

## 2015-07-06 DIAGNOSIS — E291 Testicular hypofunction: Secondary | ICD-10-CM | POA: Diagnosis not present

## 2015-07-06 LAB — PROLACTIN: Prolactin: 5.2 ng/mL (ref 2.1–17.1)

## 2015-07-06 LAB — T4, FREE: FREE T4: 1.4 ng/dL (ref 0.80–1.80)

## 2015-07-06 LAB — TSH: TSH: 1.674 u[IU]/mL (ref 0.350–4.500)

## 2015-07-06 LAB — LUTEINIZING HORMONE: LH: 11.8 m[IU]/mL — ABNORMAL HIGH (ref 1.5–9.3)

## 2015-07-06 LAB — FOLLICLE STIMULATING HORMONE: FSH: 13.5 m[IU]/mL (ref 1.4–18.1)

## 2015-07-06 LAB — CORTISOL: CORTISOL PLASMA: 17.7 ug/dL

## 2015-07-06 LAB — T3, FREE: T3 FREE: 2.6 pg/mL (ref 2.3–4.2)

## 2015-07-07 LAB — TESTOSTERONE, FREE, TOTAL, SHBG
Sex Hormone Binding: 33 nmol/L (ref 22–77)
TESTOSTERONE-% FREE: 1.9 % (ref 1.6–2.9)
Testosterone, Free: 42.1 pg/mL — ABNORMAL LOW (ref 47.0–244.0)
Testosterone: 220 ng/dL — ABNORMAL LOW (ref 250–827)

## 2015-07-07 LAB — HEPATIC FUNCTION PANEL
ALT: 17 U/L (ref 9–46)
AST: 23 U/L (ref 10–35)
Albumin: 4.4 g/dL (ref 3.6–5.1)
Alkaline Phosphatase: 80 U/L (ref 40–115)
BILIRUBIN DIRECT: 0.1 mg/dL (ref <=0.2)
Indirect Bilirubin: 0.5 mg/dL (ref 0.2–1.2)
TOTAL PROTEIN: 6.8 g/dL (ref 6.1–8.1)
Total Bilirubin: 0.6 mg/dL (ref 0.2–1.2)

## 2015-07-07 LAB — PSA: PSA: 1.5 ng/mL (ref <=4.00)

## 2015-07-07 LAB — IBC PANEL
%SAT: 10 % — AB (ref 15–60)
TIBC: 345 ug/dL (ref 250–425)
UIBC: 309 ug/dL (ref 125–400)

## 2015-07-07 LAB — IRON: IRON: 36 ug/dL — AB (ref 50–180)

## 2015-07-08 LAB — BETA HCG QUANT (REF LAB): BETA HCG, TUMOR MARKER: 2.2 m[IU]/mL (ref <5.0)

## 2015-07-10 LAB — INSULIN-LIKE GROWTH FACTOR
IGF-I, LC/MS: 88 ng/mL (ref 41–279)
Z-Score (Male): -0.5 SD (ref ?–2.0)

## 2015-07-10 LAB — ACTH: C206 ACTH: 32 pg/mL (ref 6–50)

## 2015-07-12 LAB — ESTRADIOL, FREE
ESTRADIOL FREE: 0.74 pg/mL — AB (ref <=0.45)
ESTRADIOL: 33 pg/mL — AB (ref <=29)

## 2015-07-13 ENCOUNTER — Other Ambulatory Visit: Payer: Self-pay | Admitting: Internal Medicine

## 2015-07-13 ENCOUNTER — Encounter: Payer: Self-pay | Admitting: Internal Medicine

## 2015-07-13 MED ORDER — TESTOSTERONE 50 MG/5GM (1%) TD GEL: 5.0000 g | Freq: Every day | TRANSDERMAL | Status: DC

## 2015-07-13 NOTE — Addendum Note (Signed)
Addended by: Philemon Kingdom on: 07/13/2015 05:58 PM   Modules accepted: Orders

## 2015-07-14 ENCOUNTER — Other Ambulatory Visit: Payer: Self-pay

## 2015-07-14 ENCOUNTER — Telehealth: Payer: Self-pay | Admitting: Emergency Medicine

## 2015-07-14 ENCOUNTER — Encounter (HOSPITAL_COMMUNITY): Payer: 59

## 2015-07-14 DIAGNOSIS — E611 Iron deficiency: Secondary | ICD-10-CM

## 2015-07-14 DIAGNOSIS — E088 Diabetes mellitus due to underlying condition with unspecified complications: Secondary | ICD-10-CM

## 2015-07-14 DIAGNOSIS — M25472 Effusion, left ankle: Secondary | ICD-10-CM

## 2015-07-14 DIAGNOSIS — M25471 Effusion, right ankle: Secondary | ICD-10-CM

## 2015-07-14 DIAGNOSIS — Z794 Long term (current) use of insulin: Secondary | ICD-10-CM

## 2015-07-14 MED ORDER — CARVEDILOL 6.25 MG PO TABS: 6.2500 mg | ORAL_TABLET | Freq: Two times a day (BID) | ORAL | Status: DC

## 2015-07-14 MED ORDER — SACUBITRIL-VALSARTAN 49-51 MG PO TABS: 1.0000 | ORAL_TABLET | Freq: Two times a day (BID) | ORAL | Status: DC

## 2015-07-14 MED ORDER — LIRAGLUTIDE 18 MG/3ML ~~LOC~~ SOPN: 1.8000 mg | PEN_INJECTOR | Freq: Every day | SUBCUTANEOUS | Status: DC

## 2015-07-14 MED FILL — ENTRESTO 49 MG-51 MG TABLET: 49-51 | 90 days supply | Qty: 180 | Fill #0

## 2015-07-14 MED FILL — CARVEDILOL 6.25 MG TABLET: 6.25 | 90 days supply | Qty: 180 | Fill #0

## 2015-07-14 MED FILL — VICTOZA 18 MG/3 ML INJECT P: 18 | 90 days supply | Qty: 27 | Fill #0

## 2015-07-14 NOTE — Telephone Encounter (Signed)
Pt. Notified.

## 2015-07-14 NOTE — Telephone Encounter (Signed)
   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 Have the lab send him 3 Hemoccult cards.

## 2015-07-14 NOTE — Progress Notes (Signed)
Pt coming by for a future order for a CBC. Please also give him Hemoccult cards. Thanks

## 2015-07-17 DIAGNOSIS — G4733 Obstructive sleep apnea (adult) (pediatric): Secondary | ICD-10-CM | POA: Diagnosis not present

## 2015-07-24 ENCOUNTER — Telehealth: Payer: Self-pay | Admitting: *Deleted

## 2015-07-24 NOTE — Telephone Encounter (Signed)
We received a PA request via fax for pt's Testosterone 50 mg/5 gram. Please advise if you'd like for me to complete a PA or switch? Pt prefers packets.

## 2015-07-24 NOTE — Telephone Encounter (Signed)
Let's try a PA 

## 2015-08-15 DIAGNOSIS — G4733 Obstructive sleep apnea (adult) (pediatric): Secondary | ICD-10-CM | POA: Diagnosis not present

## 2015-08-29 ENCOUNTER — Other Ambulatory Visit: Payer: Self-pay | Admitting: Emergency Medicine

## 2015-08-29 MED FILL — TAMSULOSIN HCL 0.4 MG CAP: 0.4 | 30 days supply | Qty: 30 | Fill #3

## 2015-08-29 MED FILL — LANTUS SOLOSTAR 100 UNITS/M: 100 | 25 days supply | Qty: 3 | Fill #1

## 2015-08-30 ENCOUNTER — Ambulatory Visit: Payer: 59 | Admitting: Internal Medicine

## 2015-09-01 NOTE — Telephone Encounter (Signed)
Dr Everlene Farrier, your last check up w/Dr Ouida Sills was in April 2016. Do you want to give RFs? Do you want Dr Cruzita Lederer to Rx the DM meds/supplies?

## 2015-09-12 DIAGNOSIS — I493 Ventricular premature depolarization: Secondary | ICD-10-CM | POA: Diagnosis not present

## 2015-09-14 DIAGNOSIS — G4733 Obstructive sleep apnea (adult) (pediatric): Secondary | ICD-10-CM | POA: Diagnosis not present

## 2015-09-15 MED FILL — XARELTO 20 MG TABLET: 20 | 30 days supply | Qty: 30 | Fill #0

## 2015-09-15 MED FILL — METFORMIN HCL ER 500 MG TAB: 500 | 30 days supply | Qty: 120 | Fill #0

## 2015-09-15 MED FILL — GLIMEPIRIDE 4 MG TABLET: 4 | 30 days supply | Qty: 60 | Fill #0

## 2015-09-15 MED FILL — CONTOUR NEXT STRIPS: 30 days supply | Qty: 100 | Fill #0

## 2015-10-15 DIAGNOSIS — G4733 Obstructive sleep apnea (adult) (pediatric): Secondary | ICD-10-CM | POA: Diagnosis not present

## 2015-10-18 MED FILL — FUROSEMIDE 40 MG TABLET: 40 | 30 days supply | Qty: 45 | Fill #1

## 2015-10-18 MED FILL — LANTUS SOLOSTAR 100 UNITS/M: 100 | 25 days supply | Qty: 3 | Fill #2

## 2015-10-18 MED FILL — INVOKANA 300 MG TABLET: 300 | 30 days supply | Qty: 30 | Fill #1

## 2015-10-18 MED FILL — CARVEDILOL 6.25 MG TABLET: 6.25 | 30 days supply | Qty: 60 | Fill #1

## 2015-10-18 MED FILL — GLIMEPIRIDE 4 MG TABLET: 4 | 30 days supply | Qty: 60 | Fill #1

## 2015-10-18 MED FILL — ENTRESTO 49 MG-51 MG TABLET: 49-51 | 30 days supply | Qty: 60 | Fill #1

## 2015-10-18 MED FILL — NOVOFINE 32G NEEDLES: 32G X 6 MM | 30 days supply | Qty: 100 | Fill #1

## 2015-10-18 MED FILL — VICTOZA 18 MG/3 ML INJECT P: 18 | 30 days supply | Qty: 9 | Fill #1

## 2015-10-18 MED FILL — METFORMIN HCL ER 500 MG TAB: 500 | 30 days supply | Qty: 120 | Fill #1

## 2015-10-18 MED FILL — TAMSULOSIN HCL 0.4 MG CAP: 0.4 | 30 days supply | Qty: 30 | Fill #4

## 2015-11-14 DIAGNOSIS — G4733 Obstructive sleep apnea (adult) (pediatric): Secondary | ICD-10-CM | POA: Diagnosis not present

## 2015-11-15 DIAGNOSIS — I429 Cardiomyopathy, unspecified: Secondary | ICD-10-CM | POA: Diagnosis not present

## 2015-11-15 DIAGNOSIS — Z95810 Presence of automatic (implantable) cardiac defibrillator: Secondary | ICD-10-CM | POA: Diagnosis not present

## 2015-11-15 DIAGNOSIS — K219 Gastro-esophageal reflux disease without esophagitis: Secondary | ICD-10-CM | POA: Diagnosis not present

## 2015-11-15 DIAGNOSIS — I2510 Atherosclerotic heart disease of native coronary artery without angina pectoris: Secondary | ICD-10-CM | POA: Diagnosis not present

## 2015-11-15 DIAGNOSIS — I10 Essential (primary) hypertension: Secondary | ICD-10-CM | POA: Diagnosis not present

## 2015-11-15 DIAGNOSIS — I472 Ventricular tachycardia: Secondary | ICD-10-CM | POA: Diagnosis not present

## 2015-11-15 DIAGNOSIS — E119 Type 2 diabetes mellitus without complications: Secondary | ICD-10-CM | POA: Diagnosis not present

## 2015-11-15 DIAGNOSIS — I493 Ventricular premature depolarization: Secondary | ICD-10-CM | POA: Diagnosis not present

## 2015-11-15 DIAGNOSIS — Z794 Long term (current) use of insulin: Secondary | ICD-10-CM | POA: Diagnosis not present

## 2015-11-16 DIAGNOSIS — Z7984 Long term (current) use of oral hypoglycemic drugs: Secondary | ICD-10-CM | POA: Diagnosis not present

## 2015-11-16 DIAGNOSIS — Z79899 Other long term (current) drug therapy: Secondary | ICD-10-CM | POA: Diagnosis not present

## 2015-11-16 DIAGNOSIS — I472 Ventricular tachycardia: Secondary | ICD-10-CM | POA: Diagnosis not present

## 2015-11-16 DIAGNOSIS — Z95810 Presence of automatic (implantable) cardiac defibrillator: Secondary | ICD-10-CM | POA: Diagnosis not present

## 2015-11-16 DIAGNOSIS — E119 Type 2 diabetes mellitus without complications: Secondary | ICD-10-CM | POA: Diagnosis not present

## 2015-11-16 DIAGNOSIS — Z7982 Long term (current) use of aspirin: Secondary | ICD-10-CM | POA: Diagnosis not present

## 2015-11-16 DIAGNOSIS — Z794 Long term (current) use of insulin: Secondary | ICD-10-CM | POA: Diagnosis not present

## 2015-11-16 DIAGNOSIS — I493 Ventricular premature depolarization: Secondary | ICD-10-CM | POA: Diagnosis not present

## 2015-11-16 DIAGNOSIS — E291 Testicular hypofunction: Secondary | ICD-10-CM | POA: Diagnosis not present

## 2015-11-16 DIAGNOSIS — I2510 Atherosclerotic heart disease of native coronary artery without angina pectoris: Secondary | ICD-10-CM | POA: Diagnosis not present

## 2015-11-16 DIAGNOSIS — I110 Hypertensive heart disease with heart failure: Secondary | ICD-10-CM | POA: Diagnosis not present

## 2015-11-16 DIAGNOSIS — Z955 Presence of coronary angioplasty implant and graft: Secondary | ICD-10-CM | POA: Diagnosis not present

## 2015-11-16 DIAGNOSIS — Z888 Allergy status to other drugs, medicaments and biological substances status: Secondary | ICD-10-CM | POA: Diagnosis not present

## 2015-11-16 DIAGNOSIS — I509 Heart failure, unspecified: Secondary | ICD-10-CM | POA: Diagnosis not present

## 2015-11-17 ENCOUNTER — Other Ambulatory Visit: Payer: Self-pay | Admitting: Emergency Medicine

## 2015-11-17 DIAGNOSIS — I2510 Atherosclerotic heart disease of native coronary artery without angina pectoris: Secondary | ICD-10-CM | POA: Diagnosis not present

## 2015-11-17 DIAGNOSIS — I110 Hypertensive heart disease with heart failure: Secondary | ICD-10-CM | POA: Diagnosis not present

## 2015-11-17 DIAGNOSIS — E291 Testicular hypofunction: Secondary | ICD-10-CM | POA: Diagnosis not present

## 2015-11-17 DIAGNOSIS — E119 Type 2 diabetes mellitus without complications: Secondary | ICD-10-CM | POA: Diagnosis not present

## 2015-11-17 DIAGNOSIS — I493 Ventricular premature depolarization: Secondary | ICD-10-CM | POA: Diagnosis not present

## 2015-11-17 DIAGNOSIS — I509 Heart failure, unspecified: Secondary | ICD-10-CM | POA: Diagnosis not present

## 2015-11-17 MED ORDER — INSULIN GLARGINE 100 UNIT/ML SOLOSTAR PEN: 12.0000 [IU] | PEN_INJECTOR | Freq: Every day | SUBCUTANEOUS | Status: DC

## 2015-11-17 MED FILL — ATORVASTATIN 40 MG TABLET: 40 | 30 days supply | Qty: 30 | Fill #0

## 2015-11-17 MED FILL — LANTUS SOLOSTAR 100 UNITS/M: 100 | 25 days supply | Qty: 3 | Fill #0

## 2015-11-17 MED FILL — COLCHICINE 0.6 MG TABLET: 0.6 | 30 days supply | Qty: 30 | Fill #0

## 2015-11-21 ENCOUNTER — Other Ambulatory Visit: Payer: Self-pay | Admitting: Internal Medicine

## 2015-11-21 MED FILL — CARVEDILOL 6.25 MG TABLET: 6.25 | 30 days supply | Qty: 60 | Fill #2

## 2015-11-21 MED FILL — FUROSEMIDE 40 MG TABLET: 40 | 30 days supply | Qty: 45 | Fill #2

## 2015-11-21 MED FILL — NOVOFINE 32G NEEDLES: 32G X 6 MM | 30 days supply | Qty: 100 | Fill #2

## 2015-11-21 MED FILL — GLIMEPIRIDE 4 MG TABLET: 4 | 30 days supply | Qty: 60 | Fill #2

## 2015-11-21 MED FILL — METFORMIN HCL ER 500 MG TAB: 500 | 30 days supply | Qty: 120 | Fill #2

## 2015-11-21 NOTE — Telephone Encounter (Signed)
OK to send with 30 units per day (I did advise him to titrate the dose up only started insulin) but will need to schedule another appointment within the next 1-1.5 month.

## 2015-11-21 NOTE — Telephone Encounter (Signed)
I have reviewed and did not see where you increased his Lantus dose to 30 units at bedtime. Please advise. Thank you.

## 2015-11-23 MED FILL — ENTRESTO 49 MG-51 MG TABLET: 49-51 | 30 days supply | Qty: 60 | Fill #2

## 2015-11-24 ENCOUNTER — Other Ambulatory Visit: Payer: Self-pay | Admitting: Emergency Medicine

## 2015-11-24 MED FILL — VICTOZA 18 MG/3 ML INJECT P: 18 | 30 days supply | Qty: 9 | Fill #2

## 2015-11-27 MED FILL — TAMSULOSIN HCL 0.4 MG CAP: 0.4 | 30 days supply | Qty: 30 | Fill #0

## 2015-11-29 ENCOUNTER — Encounter (INDEPENDENT_AMBULATORY_CARE_PROVIDER_SITE_OTHER): Payer: BLUE CROSS/BLUE SHIELD | Admitting: Ophthalmology

## 2015-11-29 ENCOUNTER — Encounter (INDEPENDENT_AMBULATORY_CARE_PROVIDER_SITE_OTHER): Payer: 59 | Admitting: Ophthalmology

## 2015-11-29 DIAGNOSIS — H33312 Horseshoe tear of retina without detachment, left eye: Secondary | ICD-10-CM | POA: Diagnosis not present

## 2015-11-29 DIAGNOSIS — H43813 Vitreous degeneration, bilateral: Secondary | ICD-10-CM

## 2015-11-29 DIAGNOSIS — H02831 Dermatochalasis of right upper eyelid: Secondary | ICD-10-CM | POA: Diagnosis not present

## 2015-11-29 DIAGNOSIS — H43812 Vitreous degeneration, left eye: Secondary | ICD-10-CM | POA: Diagnosis not present

## 2015-11-29 DIAGNOSIS — H35033 Hypertensive retinopathy, bilateral: Secondary | ICD-10-CM | POA: Diagnosis not present

## 2015-11-29 DIAGNOSIS — E119 Type 2 diabetes mellitus without complications: Secondary | ICD-10-CM | POA: Diagnosis not present

## 2015-11-29 DIAGNOSIS — I10 Essential (primary) hypertension: Secondary | ICD-10-CM | POA: Diagnosis not present

## 2015-11-29 DIAGNOSIS — H4911 Fourth [trochlear] nerve palsy, right eye: Secondary | ICD-10-CM | POA: Diagnosis not present

## 2015-11-29 DIAGNOSIS — H2513 Age-related nuclear cataract, bilateral: Secondary | ICD-10-CM

## 2015-11-29 DIAGNOSIS — H33022 Retinal detachment with multiple breaks, left eye: Secondary | ICD-10-CM

## 2015-11-29 DIAGNOSIS — Z961 Presence of intraocular lens: Secondary | ICD-10-CM | POA: Diagnosis not present

## 2015-11-29 DIAGNOSIS — H02834 Dermatochalasis of left upper eyelid: Secondary | ICD-10-CM | POA: Diagnosis not present

## 2015-11-29 DIAGNOSIS — H04123 Dry eye syndrome of bilateral lacrimal glands: Secondary | ICD-10-CM | POA: Diagnosis not present

## 2015-11-29 MED FILL — LANTUS SOLOSTAR 100 UNITS/M: 100 | 30 days supply | Qty: 9 | Fill #0

## 2015-12-12 ENCOUNTER — Telehealth: Payer: Self-pay | Admitting: Internal Medicine

## 2015-12-12 NOTE — Telephone Encounter (Signed)
Spoke to patient and he would like to follow up with [place], but would like to keep his follow up with [facility] next month and then follow up the month after. Pt states he will call back once he is done with [facility].

## 2015-12-12 NOTE — Telephone Encounter (Signed)
-----   Message from Emily Filbert, RN sent at 12/08/2015  2:33 PM EDT ----- Hey girls,  Dr. Caryl Comes received a note back from [facility] about this patient's attempted PVC ablation.  Can one of you please call the patient and see if he would like to follow up with [place] or is he seeing [facility].  If he is going to see [place], we should probably see him in the next month.  Thanks!

## 2015-12-13 ENCOUNTER — Ambulatory Visit (INDEPENDENT_AMBULATORY_CARE_PROVIDER_SITE_OTHER): Payer: BLUE CROSS/BLUE SHIELD | Admitting: Ophthalmology

## 2015-12-13 DIAGNOSIS — H33302 Unspecified retinal break, left eye: Secondary | ICD-10-CM

## 2015-12-15 DIAGNOSIS — G4733 Obstructive sleep apnea (adult) (pediatric): Secondary | ICD-10-CM | POA: Diagnosis not present

## 2015-12-27 ENCOUNTER — Other Ambulatory Visit (HOSPITAL_COMMUNITY): Payer: Self-pay | Admitting: Cardiology

## 2015-12-27 ENCOUNTER — Other Ambulatory Visit: Payer: Self-pay | Admitting: Emergency Medicine

## 2015-12-27 MED FILL — LANTUS SOLOSTAR 100 UNITS/M: 100 | 30 days supply | Qty: 9 | Fill #1

## 2015-12-27 MED FILL — GLIMEPIRIDE 4 MG TABLET: 4 | 30 days supply | Qty: 60 | Fill #3

## 2015-12-27 MED FILL — VICTOZA 18 MG/3 ML INJECT P: 18 | 30 days supply | Qty: 9 | Fill #3

## 2015-12-27 MED FILL — CARVEDILOL 6.25 MG TABLET: 6.25 | 30 days supply | Qty: 60 | Fill #3

## 2015-12-27 MED FILL — METFORMIN HCL ER 500 MG TAB: 500 | 30 days supply | Qty: 120 | Fill #3

## 2015-12-27 MED FILL — ENTRESTO 49 MG-51 MG TABLET: 49-51 | 30 days supply | Qty: 60 | Fill #3

## 2016-01-14 DIAGNOSIS — G4733 Obstructive sleep apnea (adult) (pediatric): Secondary | ICD-10-CM | POA: Diagnosis not present

## 2016-01-20 ENCOUNTER — Ambulatory Visit (INDEPENDENT_AMBULATORY_CARE_PROVIDER_SITE_OTHER): Payer: BLUE CROSS/BLUE SHIELD | Admitting: Emergency Medicine

## 2016-01-20 VITALS — BP 124/78 | HR 76 | Temp 98.1°F | Resp 14 | Ht 71.0 in | Wt 236.0 lb

## 2016-01-20 DIAGNOSIS — M25471 Effusion, right ankle: Secondary | ICD-10-CM | POA: Diagnosis not present

## 2016-01-20 DIAGNOSIS — M25472 Effusion, left ankle: Secondary | ICD-10-CM | POA: Diagnosis not present

## 2016-01-20 DIAGNOSIS — E1140 Type 2 diabetes mellitus with diabetic neuropathy, unspecified: Secondary | ICD-10-CM

## 2016-01-20 DIAGNOSIS — E088 Diabetes mellitus due to underlying condition with unspecified complications: Secondary | ICD-10-CM

## 2016-01-20 DIAGNOSIS — Z1159 Encounter for screening for other viral diseases: Secondary | ICD-10-CM | POA: Diagnosis not present

## 2016-01-20 DIAGNOSIS — Z125 Encounter for screening for malignant neoplasm of prostate: Secondary | ICD-10-CM

## 2016-01-20 DIAGNOSIS — Z1211 Encounter for screening for malignant neoplasm of colon: Secondary | ICD-10-CM

## 2016-01-20 DIAGNOSIS — M25561 Pain in right knee: Secondary | ICD-10-CM | POA: Diagnosis not present

## 2016-01-20 DIAGNOSIS — E291 Testicular hypofunction: Secondary | ICD-10-CM

## 2016-01-20 DIAGNOSIS — Z794 Long term (current) use of insulin: Secondary | ICD-10-CM | POA: Diagnosis not present

## 2016-01-20 DIAGNOSIS — M25562 Pain in left knee: Secondary | ICD-10-CM | POA: Diagnosis not present

## 2016-01-20 DIAGNOSIS — I82402 Acute embolism and thrombosis of unspecified deep veins of left lower extremity: Secondary | ICD-10-CM

## 2016-01-20 DIAGNOSIS — R7989 Other specified abnormal findings of blood chemistry: Secondary | ICD-10-CM

## 2016-01-20 DIAGNOSIS — I2510 Atherosclerotic heart disease of native coronary artery without angina pectoris: Secondary | ICD-10-CM | POA: Diagnosis not present

## 2016-01-20 LAB — LIPID PANEL
CHOLESTEROL: 188 mg/dL (ref 125–200)
HDL: 34 mg/dL — ABNORMAL LOW (ref >=40)
LDL CALC: 103 mg/dL (ref <130)
Total CHOL/HDL Ratio: 5.5 Ratio — ABNORMAL HIGH (ref <=5.0)
Triglycerides: 255 mg/dL — ABNORMAL HIGH (ref <150)
VLDL: 51 mg/dL — AB (ref <30)

## 2016-01-20 LAB — MICROALBUMIN, URINE: Microalb, Ur: 3.5 mg/dL

## 2016-01-20 LAB — POCT CBC
Granulocyte percent: 68.2 %G (ref 37–80)
HEMATOCRIT: 41.4 % — AB (ref 43.5–53.7)
Hemoglobin: 14.3 g/dL (ref 14.1–18.1)
LYMPH, POC: 2.7 (ref 0.6–3.4)
MCH, POC: 30.4 pg (ref 27–31.2)
MCHC: 34.6 g/dL (ref 31.8–35.4)
MCV: 87.7 fL (ref 80–97)
MID (cbc): 0.3 (ref 0–0.9)
MPV: 7.2 fL (ref 0–99.8)
POC GRANULOCYTE: 5.3 (ref 2–6.9)
POC LYMPH %: 27.5 % (ref 10–50)
POC MID %: 4.3 %M (ref 0–12)
Platelet Count, POC: 235 10*3/uL (ref 142–424)
RBC: 4.72 M/uL (ref 4.69–6.13)
RDW, POC: 14.1 %
WBC: 7.8 10*3/uL (ref 4.6–10.2)

## 2016-01-20 LAB — COMPLETE METABOLIC PANEL WITH GFR
ALT: 13 U/L (ref 9–46)
AST: 14 U/L (ref 10–35)
Albumin: 4.2 g/dL (ref 3.6–5.1)
Alkaline Phosphatase: 73 U/L (ref 40–115)
BILIRUBIN TOTAL: 0.5 mg/dL (ref 0.2–1.2)
BUN: 25 mg/dL (ref 7–25)
CO2: 30 mmol/L (ref 20–31)
CREATININE: 1.21 mg/dL (ref 0.70–1.25)
Calcium: 9 mg/dL (ref 8.6–10.3)
Chloride: 97 mmol/L — ABNORMAL LOW (ref 98–110)
GFR, EST AFRICAN AMERICAN: 71 mL/min (ref >=60)
GFR, Est Non African American: 61 mL/min (ref >=60)
GLUCOSE: 137 mg/dL — AB (ref 65–99)
Potassium: 4.3 mmol/L (ref 3.5–5.3)
Sodium: 136 mmol/L (ref 135–146)
TOTAL PROTEIN: 6.8 g/dL (ref 6.1–8.1)

## 2016-01-20 LAB — MAGNESIUM: Magnesium: 1.9 mg/dL (ref 1.5–2.5)

## 2016-01-20 LAB — HEPATITIS C ANTIBODY: HCV Ab: NEGATIVE

## 2016-01-20 LAB — GLUCOSE, POCT (MANUAL RESULT ENTRY): POC GLUCOSE: 133 mg/dL — AB (ref 70–99)

## 2016-01-20 LAB — POCT GLYCOSYLATED HEMOGLOBIN (HGB A1C): HEMOGLOBIN A1C: 8.5

## 2016-01-20 MED ORDER — METFORMIN HCL ER 500 MG PO TB24: 2000.0000 mg | ORAL_TABLET | Freq: Every day | ORAL | 3 refills | Status: DC

## 2016-01-20 MED ORDER — GLUCOSE BLOOD VI STRP: ORAL_STRIP | 12 refills | Status: DC

## 2016-01-20 MED ORDER — LANCETS MISC: 11 refills | Status: DC

## 2016-01-20 MED ORDER — INSULIN GLARGINE 100 UNIT/ML SOLOSTAR PEN: 30.0000 [IU] | PEN_INJECTOR | Freq: Every day | SUBCUTANEOUS | 5 refills | Status: DC

## 2016-01-20 MED ORDER — CARVEDILOL 6.25 MG PO TABS: 6.2500 mg | ORAL_TABLET | Freq: Two times a day (BID) | ORAL | 3 refills | Status: DC

## 2016-01-20 MED ORDER — ATORVASTATIN CALCIUM 40 MG PO TABS: 40.0000 mg | ORAL_TABLET | Freq: Every day | ORAL | 3 refills | Status: DC

## 2016-01-20 MED ORDER — INSULIN PEN NEEDLE 32G X 4 MM MISC: 11 refills | Status: DC

## 2016-01-20 MED ORDER — LIRAGLUTIDE 18 MG/3ML ~~LOC~~ SOPN: 1.8000 mg | PEN_INJECTOR | Freq: Every day | SUBCUTANEOUS | 3 refills | Status: DC

## 2016-01-20 MED ORDER — GLIMEPIRIDE 4 MG PO TABS: ORAL_TABLET | ORAL | 3 refills | Status: DC

## 2016-01-20 MED ORDER — SACUBITRIL-VALSARTAN 49-51 MG PO TABS: 1.0000 | ORAL_TABLET | Freq: Two times a day (BID) | ORAL | 3 refills | Status: DC

## 2016-01-20 MED ORDER — BLOOD GLUCOSE MONITOR SYSTEM W/DEVICE KIT: PACK | 0 refills | Status: DC

## 2016-01-20 MED ORDER — FUROSEMIDE 40 MG PO TABS: ORAL_TABLET | ORAL | 3 refills | Status: DC

## 2016-01-20 NOTE — Progress Notes (Signed)
By signing my name below, I, Moises Blood, attest that this documentation has been prepared under the direction and in the presence of Arlyss Queen, MD. Electronically Signed: Moises Blood, [place]. 01/20/2016 , 9:37 AM .  Patient was seen in room 14 .  Chief Complaint:  Chief Complaint  Patient presents with  . Annual Exam    HPI: Paul [name] is a 70 y.o. male who reports to [facility] today for annual physical. Overall, he's doing well: his sugar has gone down, and his BP is good.   CV He had PVC's ablated recently. He's on coreg 6.54m bid. He denies taking blood thinner currently. He has a history of blood clot in his left leg and taken medication for a year. He denies any swelling. He wears compression stockings.   He has a defibrillator in place, due to ejection fracture; last being 40%. He denies defibrillator ever being fired.   Vision He recently saw his ophthalmologist, Dr. GKaty Fitch He mentions seeing retina specialist about 8 months ago for posterior retinal detachment; plans to follow up with retina specialist on 03/16/16.   Cancer screening He denies history of skin cancer.  His last colonoscopy was about 11 years ago and requested referral.   Immunizations He's up-to-date on his immunizations.   Testosterone Patient notes Dr. GCruzita Ledererwrote him a prescription for testosterone but he wasn't able to fill it due to insurance changes.   Arthralgia He also mentions bilateral knee pain, left worse than right. 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   OSA He uses his cpap machine 6 hours every night.   Past Medical History:  Diagnosis Date  . Arthritis    "thumbs, neck, knees" (06/01/2013)  . Cardiomyopathy, dilated, nonischemic ([place] 10/20/2012  . Cataract   . CHF (congestive heart failure) ([place]    "mild" (06/01/2013)  . Erectile dysfunction   . GERD (gastroesophageal reflux disease)   . Hypertension   . Hypogonadism male   . Hypovitaminosis D   . 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 Type II diabetes mellitus ([place]   . Ventricular tachycardia ([place]    observed during stress testing, subsequent VT on EPS   Past Surgical History:  Procedure Laterality Date  . ABLATION  06-01-2013   PVC's ablated along basal inferoseptal RV by Dr ARayann Heman . APPENDECTOMY  1969  . CARDIAC CATHETERIZATION  04/2010  . CARDIAC DEFIBRILLATOR PLACEMENT  05/09/2010   [place]ban . CATARACT EXTRACTION W/ INTRAOCULAR LENS  IMPLANT, BILATERAL Bilateral   . KNEE ARTHROSCOPY Left 1984  . LUMBAR LAMINECTOMY  ~ 2004  . TONSILLECTOMY AND ADENOIDECTOMY  1972  . V-TACH ABLATION N/A 06/01/2013   Procedure: V-TACH ABLATION;  Surgeon: JCoralyn Mark MD;  [facility];  Service: Cardiovascular;  Laterality: N/A;  . VASECTOMY    . VENTRICULAR ABLATION SURGERY  06/01/2013   Social History   Social History  . Marital status: Married    Spouse name: N/A  . Number of children: N/A  . Years of education: N/A   Social History Main Topics  . Smoking status: Never Smoker  . Smokeless tobacco: Never Used  . Alcohol use 1.2 oz/week    1 Glasses of wine, 1 Cans of beer per week  . Drug use: No  . Sexual activity: Yes    Birth control/ protection: Post-menopausal   Other Topics Concern  . 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    [place] 1[place]5 years [place]   Family History  Problem Relation Age of Onset  . Alcohol abuse Mother   . COPD Mother   . Depression Mother   . Heart disease Mother   . Hypertension Mother   . Stroke Mother   . COPD Father   . Heart disease Father   . Hypertension Father   . Stroke Father   . Cancer Father   . Diabetes Sister   . Mental retardation Sister   . Learning disabilities Sister   . Hypertension Sister   . Alcohol abuse  Brother   . Drug abuse Brother   . Heart disease Brother   . Hypertension Brother   . Coronary artery disease Brother    Allergies  Allergen Reactions  . Lisinopril     angioedema   Prior to Admission medications   Medication Sig Start Date End Date Taking? Authorizing Provider  atorvastatin (LIPITOR) 40 MG tablet Take 40 mg by mouth daily.   Yes Historical Provider, MD  BAYER CONTOUR NEXT TEST test strip USE TO CHECK BLOOD SUGARS THREE TIMES DAILY 09/02/15  Yes Darlyne Russian, MD  Blood Glucose Monitoring Suppl (BLOOD GLUCOSE MONITOR SYSTEM) W/DEVICE KIT Glucose monitor to check blood sugars tid dx code E11.9 brand per insurance coverage 06/21/14  Yes Darlyne Russian, MD  carvedilol (COREG) 6.25 MG tablet Take 1 tablet (6.25 mg total) by mouth 2 (two) times daily. 07/14/15  Yes Darlyne Russian, MD  Cholecalciferol (CVS VIT D 5000 HIGH-POTENCY PO) Take 10,000 Units by mouth daily.   Yes Historical Provider, MD  clopidogrel (PLAVIX) 75 MG tablet Take 1 tablet (75 mg total) by mouth daily. 09/23/14  Yes Darlyne Russian, MD  furosemide (LASIX) 40 MG tablet TAKE 1 TABLET BY MOUTH EVERY MORNING AND 1/2 TABLET EVERY EVENING 12/29/15  Yes Larey Dresser, MD  glimepiride (AMARYL) 4 MG tablet TAKE 2 TABLETS BY MOUTH DAILY BEFORE BREAKFAST 09/02/15  Yes Darlyne Russian, MD  Insulin Glargine (LANTUS SOLOSTAR) 100 UNIT/ML Solostar Pen Inject 30 Units into the skin at bedtime. **PT NEEDS FOLLOW UP APPT** 11/21/15  Yes Philemon Kingdom, MD  Insulin Pen Needle (CLICKFINE PEN NEEDLES) 32G X 4 MM MISC Use 2x a day - with Lantus and Victoza. 06/30/15  Yes Philemon Kingdom, MD  Lancets MISC Glucose monitor to check blood sugars tid dx code E11.9 brand per insurance coverage 06/21/14  Yes Darlyne Russian, MD  Liraglutide (VICTOZA) 18 MG/3ML SOPN Inject 0.3 mLs (1.8 mg total) into the skin daily. 07/14/15  Yes Darlyne Russian, MD  magnesium oxide (MAG-OX) 400 MG tablet Take 1 tablet (400 mg total) by mouth daily. 10/12/13  Yes Deboraha Sprang, MD  metFORMIN (GLUCOPHAGE-XR) 500 MG 24 hr tablet Take 2,000 mg by mouth daily with breakfast.   Yes Historical Provider, MD  sacubitril-valsartan (ENTRESTO) 49-51 MG Take 1 tablet by mouth 2 (two) times daily. 07/14/15  Yes Darlyne Russian, MD  testosterone (ANDROGEL) 50 MG/5GM (1%) GEL Place 5 g onto the skin daily. 07/13/15  Yes Philemon Kingdom, MD  XARELTO 20 MG TABS tablet TAKE 1 TABLET BY MOUTH DAILY WITH SUPPER. 09/02/15  Yes Darlyne Russian, MD     ROS:  Constitutional: negative for fever, chills, night sweats, weight changes, or fatigue  HEENT: negative for vision  changes, hearing loss, congestion, rhinorrhea, ST, epistaxis, or sinus pressure Cardiovascular: negative for chest pain or palpitations Respiratory: negative for hemoptysis, wheezing, shortness of breath, or cough Abdominal: negative for abdominal pain, nausea, vomiting, diarrhea, or constipation Dermatological: negative for rash Musc: positive for arthralgia; negative for leg swelling Neurologic: negative for headache, dizziness, or syncope All other systems reviewed and are otherwise negative with the exception to those above and in the HPI.  PHYSICAL EXAM: Vitals:   01/20/16 0854  BP: 124/78  Pulse: 76  Resp: 14  Temp: 98.1 F (36.7 C)   Body mass index is 32.92 kg/m.   General: Alert, no acute distress; defibrillator in place HEENT:  Normocephalic, atraumatic, oropharynx patent. Eye: Juliette Mangle [facility] Cardiovascular:  Frequent PVC's, Regular rate and rhythm, no rubs murmurs or gallops.  No Carotid bruits, radial pulse intact. No pedal edema; pulses intact Respiratory: Clear to auscultation bilaterally.  No wheezes, rales, or rhonchi.  No cyanosis, no use of accessory musculature Abdominal: No organomegaly, abdomen is soft and non-tender, positive bowel sounds. No masses. Musculoskeletal: Gait intact. No edema, tenderness; swelling on the right calf more than left Skin: No rashes. Neurologic: Facial  musculature symmetric. Psychiatric: Patient acts appropriately throughout our interaction.  Lymphatic: No cervical or submandibular lymphadenopathy Genitourinary/Anorectal: No acute findings  LABS: Results for orders placed or performed in visit on 01/20/16  POCT CBC  Result Value Ref Range   WBC 7.8 4.6 - 10.2 K/uL   Lymph, poc 2.7 0.6 - 3.4   POC LYMPH PERCENT 27.5 10 - 50 %L   MID (cbc) 0.3 0 - 0.9   POC MID % 4.3 0 - 12 %M   POC Granulocyte 5.3 2 - 6.9   Granulocyte percent 68.2 37 - 80 %G   RBC 4.72 4.69 - 6.13 M/uL   Hemoglobin 14.3 14.1 - 18.1 g/dL   HCT, POC 41.4 (A) 43.5 - 53.7 %   MCV 87.7 80 - 97 fL   MCH, POC 30.4 27 - 31.2 pg   MCHC 34.6 31.8 - 35.4 g/dL   RDW, POC 14.1 %   Platelet Count, POC 235 142 - 424 K/uL   MPV 7.2 0 - 99.8 fL  POCT glucose (manual entry)  Result Value Ref Range   POC Glucose 133 (A) 70 - 99 mg/dl    EKG/XRAY:     ASSESSMENT/PLAN: Patient looks great. His medications were updated Labs done. Will schedule for  GI evaluation for colonoscopy and venous Doppler. He is interested in resuming his flying and will review  these forms.I personally performed the services described in this documentation, which was scribed in my presence. The recorded information has been reviewed and is accurate.  Gross sideeffects, risk and benefits, and alternatives of medications d/w patient. Patient is aware that all medications have potential sideeffects and we are unable to predict every sideeffect or drug-drug interaction that may occur.  Arlyss Queen MD 01/20/2016 9:04 AM

## 2016-01-20 NOTE — Patient Instructions (Signed)
     IF you received an x-ray today, you will receive an invoice from [facility]. Please contact [facility] at [phone] with questions or concerns regarding your invoice.   IF you received labwork today, you will receive an invoice from [facility]. Please contact [facility] at [phone] with questions or concerns regarding your invoice.   Our billing staff will not be able to assist you with questions regarding bills from these companies.  You will be contacted with the lab results as soon as they are available. The fastest way to get your results is to activate your My Chart account. Instructions are located on the last page of this paperwork. If you have not heard from us regarding the results in 2 weeks, please contact this office.      

## 2016-01-22 ENCOUNTER — Encounter: Payer: Self-pay | Admitting: *Deleted

## 2016-01-22 LAB — PSA: PSA: 0.9 ng/mL (ref <=4.0)

## 2016-01-22 MED FILL — FUROSEMIDE 40 MG TABLET: 40 | 20 days supply | Qty: 30 | Fill #0

## 2016-01-22 MED FILL — MICROLET LANCETS: 30 days supply | Qty: 100 | Fill #0

## 2016-01-22 MED FILL — ATORVASTATIN 40 MG TABLET: 40 | 30 days supply | Qty: 30 | Fill #0

## 2016-01-22 MED FILL — CARVEDILOL 6.25 MG TABLET: 6.25 | 30 days supply | Qty: 60 | Fill #0

## 2016-01-22 MED FILL — LANTUS SOLOSTAR 100 UNITS/M: 100 | 30 days supply | Qty: 9 | Fill #0

## 2016-01-22 MED FILL — CONTOUR NEXT STRIPS: 30 days supply | Qty: 100 | Fill #0

## 2016-01-22 MED FILL — GLIMEPIRIDE 4 MG TABLET: 4 | 30 days supply | Qty: 60 | Fill #0

## 2016-01-22 MED FILL — VICTOZA 18 MG/3 ML INJECT P: 18 | 30 days supply | Qty: 9 | Fill #0

## 2016-01-22 MED FILL — UNIFINE PENTIPS 32GX5/32: 32G X 4 MM | 30 days supply | Qty: 100 | Fill #0

## 2016-01-22 MED FILL — METFORMIN HCL ER 500 MG TAB: 500 | 30 days supply | Qty: 120 | Fill #0

## 2016-01-22 MED FILL — ENTRESTO 49 MG-51 MG TABLET: 49-51 | 30 days supply | Qty: 60 | Fill #0

## 2016-01-24 LAB — TESTOS,TOTAL,FREE AND SHBG (FEMALE)
SEX HORMONE BINDING GLOB.: 27 nmol/L (ref 22–77)
TESTOSTERONE,FREE: 66.2 pg/mL (ref 35.0–155.0)
Testosterone,Total,LC/MS/MS: 336 ng/dL (ref 250–1100)

## 2016-01-24 MED FILL — CONTOUR NEXT EZ METER: W/DEVICE | 1 days supply | Qty: 1 | Fill #0

## 2016-02-26 ENCOUNTER — Encounter: Payer: Self-pay | Admitting: Emergency Medicine

## 2016-02-27 MED FILL — VICTOZA 18 MG/3 ML INJECT P: 18 | 30 days supply | Qty: 9 | Fill #1

## 2016-02-27 MED FILL — CARVEDILOL 6.25 MG TABLET: 6.25 | 30 days supply | Qty: 60 | Fill #1

## 2016-02-27 MED FILL — ATORVASTATIN 40 MG TABLET: 40 | 30 days supply | Qty: 30 | Fill #1

## 2016-02-27 MED FILL — GLIMEPIRIDE 4 MG TABLET: 4 | 30 days supply | Qty: 60 | Fill #1

## 2016-02-27 MED FILL — LANTUS SOLOSTAR 100 UNITS/M: 100 | 30 days supply | Qty: 9 | Fill #1

## 2016-02-27 MED FILL — METFORMIN HCL ER 500 MG TAB: 500 | 30 days supply | Qty: 120 | Fill #1

## 2016-02-27 MED FILL — ENTRESTO 49 MG-51 MG TABLET: 49-51 | 30 days supply | Qty: 60 | Fill #1

## 2016-03-05 MED FILL — MELOXICAM 7.5 MG TABLET: 7.5 | 30 days supply | Qty: 30 | Fill #0

## 2016-03-20 MED FILL — FUROSEMIDE 40 MG TABLET: 40 | 30 days supply | Qty: 45 | Fill #1

## 2016-03-29 MED FILL — FINASTERIDE 5 MG TABLET: 5 | 30 days supply | Qty: 30 | Fill #0

## 2016-03-29 MED FILL — VICTOZA 18 MG/3 ML INJECT P: 18 | 30 days supply | Qty: 9 | Fill #2

## 2016-03-29 MED FILL — UNIFINE PENTIPS 32GX5/32: 32G X 4 MM | 30 days supply | Qty: 100 | Fill #1

## 2016-03-29 MED FILL — LANTUS SOLOSTAR 100 UNITS/M: 100 | 30 days supply | Qty: 9 | Fill #2

## 2016-03-29 MED FILL — CARVEDILOL 6.25 MG TABLET: 6.25 | 30 days supply | Qty: 60 | Fill #2

## 2016-03-29 MED FILL — ENTRESTO 49 MG-51 MG TABLET: 49-51 | 30 days supply | Qty: 60 | Fill #2

## 2016-03-29 MED FILL — ATORVASTATIN 40 MG TABLET: 40 | 30 days supply | Qty: 30 | Fill #2

## 2016-03-29 MED FILL — METFORMIN HCL ER 500 MG TAB: 500 | 30 days supply | Qty: 120 | Fill #2

## 2016-03-29 MED FILL — GLIMEPIRIDE 4 MG TABLET: 4 | 30 days supply | Qty: 60 | Fill #2

## 2016-03-29 MED FILL — UNIFINE PENTIPS 32GX5/32": 32G X 4 MM | 30 days supply | Qty: 100 | Fill #1

## 2016-03-30 MED FILL — MELOXICAM 7.5 MG TABLET: 7.5 | 30 days supply | Qty: 30 | Fill #1

## 2016-04-15 ENCOUNTER — Ambulatory Visit (INDEPENDENT_AMBULATORY_CARE_PROVIDER_SITE_OTHER): Payer: BLUE CROSS/BLUE SHIELD | Admitting: Ophthalmology

## 2016-04-15 DIAGNOSIS — H33302 Unspecified retinal break, left eye: Secondary | ICD-10-CM

## 2016-04-15 DIAGNOSIS — H43813 Vitreous degeneration, bilateral: Secondary | ICD-10-CM

## 2016-04-15 DIAGNOSIS — H35033 Hypertensive retinopathy, bilateral: Secondary | ICD-10-CM

## 2016-04-15 DIAGNOSIS — I10 Essential (primary) hypertension: Secondary | ICD-10-CM

## 2016-04-16 DIAGNOSIS — I493 Ventricular premature depolarization: Secondary | ICD-10-CM | POA: Diagnosis not present

## 2016-04-16 DIAGNOSIS — Z95810 Presence of automatic (implantable) cardiac defibrillator: Secondary | ICD-10-CM | POA: Diagnosis not present

## 2016-04-16 DIAGNOSIS — I429 Cardiomyopathy, unspecified: Secondary | ICD-10-CM | POA: Diagnosis not present

## 2016-04-25 MED FILL — VICTOZA 18 MG/3 ML INJECT P: 18 | 30 days supply | Qty: 9 | Fill #3

## 2016-04-25 MED FILL — UNIFINE PENTIPS 32GX5/32: 32G X 4 MM | 30 days supply | Qty: 100 | Fill #2

## 2016-04-25 MED FILL — FINASTERIDE 5 MG TABLET: 5 | 30 days supply | Qty: 30 | Fill #1

## 2016-04-25 MED FILL — METFORMIN HCL ER 500 MG TAB: 500 | 30 days supply | Qty: 120 | Fill #3

## 2016-04-25 MED FILL — ENTRESTO 49 MG-51 MG TABLET: 49-51 | 30 days supply | Qty: 60 | Fill #3

## 2016-04-25 MED FILL — FUROSEMIDE 40 MG TABLET: 40 | 30 days supply | Qty: 45 | Fill #2

## 2016-04-25 MED FILL — ATORVASTATIN 40 MG TABLET: 40 | 30 days supply | Qty: 30 | Fill #3

## 2016-04-25 MED FILL — CARVEDILOL 6.25 MG TABLET: 6.25 | 30 days supply | Qty: 60 | Fill #3

## 2016-04-25 MED FILL — GLIMEPIRIDE 4 MG TABLET: 4 | 30 days supply | Qty: 60 | Fill #3

## 2016-04-25 MED FILL — MELOXICAM 7.5 MG TABLET: 7.5 | 30 days supply | Qty: 30 | Fill #2

## 2016-04-25 MED FILL — UNIFINE PENTIPS 32GX5/32": 32G X 4 MM | 30 days supply | Qty: 100 | Fill #2

## 2016-04-25 MED FILL — LANTUS SOLOSTAR 100 UNITS/M: 100 | 30 days supply | Qty: 9 | Fill #3

## 2016-05-14 ENCOUNTER — Other Ambulatory Visit: Payer: Self-pay | Admitting: Physician Assistant

## 2016-05-14 DIAGNOSIS — S83249A Other tear of medial meniscus, current injury, unspecified knee, initial encounter: Secondary | ICD-10-CM

## 2016-05-15 ENCOUNTER — Ambulatory Visit
Admission: RE | Admit: 2016-05-15 | Discharge: 2016-05-15 | Disposition: A | Discharge status: Home / Self Care | Payer: BLUE CROSS/BLUE SHIELD | Source: Ambulatory Visit | Attending: Physician Assistant | Admitting: Physician Assistant

## 2016-05-15 DIAGNOSIS — S83249A Other tear of medial meniscus, current injury, unspecified knee, initial encounter: Secondary | ICD-10-CM

## 2016-05-28 ENCOUNTER — Other Ambulatory Visit: Payer: Self-pay | Admitting: Emergency Medicine

## 2016-05-28 DIAGNOSIS — E088 Diabetes mellitus due to underlying condition with unspecified complications: Secondary | ICD-10-CM

## 2016-05-28 DIAGNOSIS — Z794 Long term (current) use of insulin: Principal | ICD-10-CM

## 2016-05-28 MED FILL — VICTOZA 18 MG/3 ML INJECT P: 18 | 20 days supply | Qty: 6 | Fill #0

## 2016-05-28 MED FILL — METFORMIN HCL ER 500 MG TAB: 500 | 30 days supply | Qty: 120 | Fill #4

## 2016-05-28 MED FILL — LANTUS SOLOSTAR 100 UNITS/M: 100 | 30 days supply | Qty: 9 | Fill #4

## 2016-05-28 MED FILL — FINASTERIDE 5 MG TABLET: 5 | 30 days supply | Qty: 30 | Fill #2

## 2016-05-28 MED FILL — ENTRESTO 49 MG-51 MG TABLET: 49-51 | 30 days supply | Qty: 60 | Fill #4

## 2016-05-28 MED FILL — UNIFINE PENTIPS 32GX5/32: 32G X 4 MM | 30 days supply | Qty: 100 | Fill #3

## 2016-05-28 MED FILL — FUROSEMIDE 40 MG TABLET: 40 | 30 days supply | Qty: 45 | Fill #3

## 2016-05-28 MED FILL — CARVEDILOL 6.25 MG TABLET: 6.25 | 30 days supply | Qty: 60 | Fill #4

## 2016-05-28 MED FILL — GLIMEPIRIDE 4 MG TABLET: 4 | 30 days supply | Qty: 60 | Fill #4

## 2016-05-28 MED FILL — ATORVASTATIN 40 MG TABLET: 40 | 30 days supply | Qty: 30 | Fill #4

## 2016-05-28 MED FILL — UNIFINE PENTIPS 32GX5/32": 32G X 4 MM | 30 days supply | Qty: 100 | Fill #3

## 2016-06-07 MED FILL — FUROSEMIDE 40 MG TABLET: 40 | 30 days supply | Qty: 45 | Fill #4

## 2016-06-27 MED FILL — FINASTERIDE 5 MG TABLET: 5 | 30 days supply | Qty: 30 | Fill #3

## 2016-06-27 MED FILL — METFORMIN HCL ER 500 MG TAB: 500 | 30 days supply | Qty: 120 | Fill #5

## 2016-06-27 MED FILL — VICTOZA 18 MG/3 ML INJECT P: 18 | 30 days supply | Qty: 9 | Fill #0

## 2016-06-27 MED FILL — ATORVASTATIN 40 MG TABLET: 40 | 30 days supply | Qty: 30 | Fill #5

## 2016-06-27 MED FILL — CONTOUR NEXT STRIPS: 30 days supply | Qty: 100 | Fill #1

## 2016-06-27 MED FILL — ENTRESTO 49 MG-51 MG TABLET: 49-51 | 30 days supply | Qty: 60 | Fill #5

## 2016-06-27 MED FILL — CARVEDILOL 6.25 MG TABLET: 6.25 | 30 days supply | Qty: 60 | Fill #5

## 2016-06-27 MED FILL — GLIMEPIRIDE 4 MG TABLET: 4 | 30 days supply | Qty: 60 | Fill #5

## 2016-06-27 MED FILL — UNIFINE PENTIPS 32GX5/32": 32G X 4 MM | 30 days supply | Qty: 100 | Fill #4

## 2016-06-27 MED FILL — UNIFINE PENTIPS 32GX5/32: 32G X 4 MM | 30 days supply | Qty: 100 | Fill #4

## 2016-06-27 MED FILL — LANTUS SOLOSTAR 100 UNITS/M: 100 | 30 days supply | Qty: 9 | Fill #5

## 2016-06-28 MED FILL — SULFAMETHOXAZOLE/TMP DS TAB: 800-160 | 10 days supply | Qty: 20 | Fill #0

## 2016-07-26 MED FILL — METFORMIN HCL ER 500 MG TAB: 500 | 30 days supply | Qty: 120 | Fill #6

## 2016-07-26 MED FILL — ATORVASTATIN 40 MG TABLET: 40 | 30 days supply | Qty: 30 | Fill #6

## 2016-07-26 MED FILL — FINASTERIDE 5 MG TABLET: 5 | 30 days supply | Qty: 30 | Fill #4

## 2016-07-26 MED FILL — LANTUS SOLOSTAR 100 UNITS/M: 100 | 30 days supply | Qty: 9 | Fill #6

## 2016-07-26 MED FILL — MELOXICAM 7.5 MG TABLET: 7.5 | 30 days supply | Qty: 30 | Fill #0

## 2016-07-26 MED FILL — VICTOZA 18 MG/3 ML INJECT P: 18 | 30 days supply | Qty: 9 | Fill #1

## 2016-07-26 MED FILL — CARVEDILOL 6.25 MG TABLET: 6.25 | 30 days supply | Qty: 60 | Fill #6

## 2016-07-26 MED FILL — ENTRESTO 49 MG-51 MG TABLET: 49-51 | 30 days supply | Qty: 60 | Fill #6

## 2016-07-26 MED FILL — FUROSEMIDE 40 MG TABLET: 40 | 30 days supply | Qty: 45 | Fill #5

## 2016-07-26 MED FILL — GLIMEPIRIDE 4 MG TABLET: 4 | 30 days supply | Qty: 60 | Fill #6

## 2016-08-28 MED FILL — CARVEDILOL 6.25 MG TABLET: 6.25 | 30 days supply | Qty: 60 | Fill #7

## 2016-08-28 MED FILL — GLIMEPIRIDE 4 MG TABLET: 4 | 30 days supply | Qty: 60 | Fill #7

## 2016-08-28 MED FILL — METFORMIN HCL ER 500 MG TAB: 500 | 30 days supply | Qty: 120 | Fill #7

## 2016-08-28 MED FILL — LANTUS SOLOSTAR 100 UNITS/M: 100 | 30 days supply | Qty: 9 | Fill #7

## 2016-08-28 MED FILL — ATORVASTATIN 40 MG TABLET: 40 | 30 days supply | Qty: 30 | Fill #7

## 2016-08-28 MED FILL — UNIFINE PENTIPS 32GX5/32": 32G X 4 MM | 30 days supply | Qty: 100 | Fill #5

## 2016-08-28 MED FILL — FINASTERIDE 5 MG TABLET: 5 | 30 days supply | Qty: 30 | Fill #5

## 2016-08-28 MED FILL — FUROSEMIDE 40 MG TABLET: 40 | 30 days supply | Qty: 45 | Fill #6

## 2016-08-28 MED FILL — MELOXICAM 7.5 MG TABLET: 7.5 | 30 days supply | Qty: 30 | Fill #1

## 2016-08-28 MED FILL — VICTOZA 18 MG/3 ML INJECT P: 18 | 30 days supply | Qty: 9 | Fill #2

## 2016-08-28 MED FILL — ENTRESTO 49 MG-51 MG TABLET: 49-51 | 30 days supply | Qty: 60 | Fill #7

## 2016-08-28 MED FILL — UNIFINE PENTIPS 32GX5/32: 32G X 4 MM | 30 days supply | Qty: 100 | Fill #5

## 2016-09-17 ENCOUNTER — Encounter: Payer: Self-pay | Admitting: Cardiology

## 2016-09-26 MED FILL — ATORVASTATIN 40 MG TABLET: 40 | 30 days supply | Qty: 30 | Fill #8

## 2016-09-26 MED FILL — ENTRESTO 49 MG-51 MG TABLET: 49-51 | 30 days supply | Qty: 60 | Fill #8

## 2016-09-26 MED FILL — CARVEDILOL 6.25 MG TABLET: 6.25 | 30 days supply | Qty: 60 | Fill #8

## 2016-09-26 MED FILL — VICTOZA 18 MG/3 ML INJECT P: 18 | 30 days supply | Qty: 9 | Fill #0

## 2016-09-26 MED FILL — GLIMEPIRIDE 4 MG TABLET: 4 | 30 days supply | Qty: 60 | Fill #8

## 2016-09-26 MED FILL — LANTUS SOLOSTAR 100 UNITS/M: 100 | 30 days supply | Qty: 9 | Fill #8

## 2016-09-26 MED FILL — FUROSEMIDE 40 MG TABLET: 40 | 30 days supply | Qty: 45 | Fill #7

## 2016-09-26 MED FILL — MELOXICAM 7.5 MG TABLET: 7.5 | 30 days supply | Qty: 30 | Fill #0

## 2016-09-26 MED FILL — METFORMIN HCL ER 500 MG TAB: 500 | 30 days supply | Qty: 120 | Fill #8

## 2016-10-25 MED FILL — MELOXICAM 7.5 MG TABLET: 7.5 | 30 days supply | Qty: 30 | Fill #1

## 2016-10-25 MED FILL — ENTRESTO 49 MG-51 MG TABLET: 49-51 | 30 days supply | Qty: 60 | Fill #9

## 2016-10-25 MED FILL — GLIMEPIRIDE 4 MG TABLET: 4 | 30 days supply | Qty: 60 | Fill #9

## 2016-10-25 MED FILL — UNIFINE PENTIPS 32GX5/32": 32G X 4 MM | 30 days supply | Qty: 100 | Fill #6

## 2016-10-25 MED FILL — CARVEDILOL 6.25 MG TABLET: 6.25 | 30 days supply | Qty: 60 | Fill #9

## 2016-10-25 MED FILL — LANTUS SOLOSTAR 100 UNITS/M: 100 | 30 days supply | Qty: 9 | Fill #9

## 2016-10-25 MED FILL — ATORVASTATIN 40 MG TABLET: 40 | 30 days supply | Qty: 30 | Fill #9

## 2016-10-25 MED FILL — VICTOZA 18 MG/3 ML INJECT P: 18 | 30 days supply | Qty: 9 | Fill #1

## 2016-10-25 MED FILL — METFORMIN HCL ER 500 MG TAB: 500 | 30 days supply | Qty: 120 | Fill #9

## 2016-10-25 MED FILL — FUROSEMIDE 40 MG TABLET: 40 | 10 days supply | Qty: 15 | Fill #8

## 2016-10-25 MED FILL — UNIFINE PENTIPS 32GX5/32: 32G X 4 MM | 30 days supply | Qty: 100 | Fill #6

## 2016-11-22 MED FILL — NABUMETONE 500 MG TABLET: 500 | 30 days supply | Qty: 60 | Fill #0

## 2016-11-27 MED FILL — FUROSEMIDE 40 MG TABLET: 40 | 30 days supply | Qty: 45 | Fill #0

## 2016-11-27 MED FILL — UNIFINE PENTIPS 32GX5/32: 32G X 4 MM | 30 days supply | Qty: 100 | Fill #7

## 2016-11-27 MED FILL — LANTUS SOLOSTAR 100 UNITS/M: 100 | 30 days supply | Qty: 12 | Fill #0

## 2016-11-27 MED FILL — ATORVASTATIN 40 MG TABLET: 40 | 30 days supply | Qty: 30 | Fill #10

## 2016-11-27 MED FILL — METFORMIN HCL ER 500 MG TAB: 500 | 30 days supply | Qty: 120 | Fill #10

## 2016-11-27 MED FILL — ENTRESTO 49 MG-51 MG TABLET: 49-51 | 30 days supply | Qty: 60 | Fill #10

## 2016-11-27 MED FILL — VICTOZA 18 MG/3 ML INJECT P: 18 | 30 days supply | Qty: 9 | Fill #2

## 2016-11-27 MED FILL — CARVEDILOL 6.25 MG TABLET: 6.25 | 30 days supply | Qty: 60 | Fill #10

## 2016-11-27 MED FILL — GLIMEPIRIDE 4 MG TABLET: 4 | 30 days supply | Qty: 60 | Fill #10

## 2016-11-27 MED FILL — UNIFINE PENTIPS 32GX5/32": 32G X 4 MM | 30 days supply | Qty: 100 | Fill #7

## 2016-12-04 DIAGNOSIS — E119 Type 2 diabetes mellitus without complications: Secondary | ICD-10-CM | POA: Diagnosis not present

## 2016-12-04 DIAGNOSIS — H43813 Vitreous degeneration, bilateral: Secondary | ICD-10-CM | POA: Diagnosis not present

## 2016-12-04 DIAGNOSIS — H33312 Horseshoe tear of retina without detachment, left eye: Secondary | ICD-10-CM | POA: Diagnosis not present

## 2016-12-04 DIAGNOSIS — H04123 Dry eye syndrome of bilateral lacrimal glands: Secondary | ICD-10-CM | POA: Diagnosis not present

## 2016-12-04 DIAGNOSIS — Z961 Presence of intraocular lens: Secondary | ICD-10-CM | POA: Diagnosis not present

## 2016-12-04 DIAGNOSIS — H4911 Fourth [trochlear] nerve palsy, right eye: Secondary | ICD-10-CM | POA: Diagnosis not present

## 2016-12-04 DIAGNOSIS — H02831 Dermatochalasis of right upper eyelid: Secondary | ICD-10-CM | POA: Diagnosis not present

## 2016-12-04 DIAGNOSIS — H02834 Dermatochalasis of left upper eyelid: Secondary | ICD-10-CM | POA: Diagnosis not present

## 2016-12-27 MED FILL — UNIFINE PENTIPS 32GX5/32: 32G X 4 MM | 30 days supply | Qty: 100 | Fill #8

## 2016-12-27 MED FILL — CARVEDILOL 6.25 MG TABLET: 6.25 | 30 days supply | Qty: 60 | Fill #11

## 2016-12-27 MED FILL — ATORVASTATIN 40 MG TABLET: 40 | 30 days supply | Qty: 30 | Fill #11

## 2016-12-27 MED FILL — NABUMETONE 500 MG TABLET: 500 | 30 days supply | Qty: 60 | Fill #1

## 2016-12-27 MED FILL — METFORMIN HCL ER 500 MG TAB: 500 | 30 days supply | Qty: 120 | Fill #11

## 2016-12-27 MED FILL — GLIMEPIRIDE 4 MG TABLET: 4 | 30 days supply | Qty: 60 | Fill #11

## 2016-12-27 MED FILL — VICTOZA 18 MG/3 ML INJECT P: 18 | 30 days supply | Qty: 9 | Fill #3

## 2016-12-27 MED FILL — FUROSEMIDE 40 MG TABLET: 40 | 30 days supply | Qty: 45 | Fill #1

## 2016-12-27 MED FILL — LANTUS SOLOSTAR 100 UNITS/M: 100 | 30 days supply | Qty: 12 | Fill #1

## 2016-12-27 MED FILL — UNIFINE PENTIPS 32GX5/32": 32G X 4 MM | 30 days supply | Qty: 100 | Fill #8

## 2016-12-30 ENCOUNTER — Telehealth: Payer: Self-pay

## 2016-12-30 NOTE — Telephone Encounter (Signed)
PA Started for [employer]. Awaiting Response

## 2017-01-02 NOTE — Telephone Encounter (Signed)
Called to check on status of PA. Should have determination by tomorrow evening. Reference # [id number]  ICD 10 codes used: M25.471, M25.472. Converted from original ICD 9 codes  Insurance Phone # to use: [phone] Fax: [phone]

## 2017-01-06 NOTE — Telephone Encounter (Signed)
PA for [employer]. Appeal Key Code [facility]  Would you like to appeal this decision? Action is required by today**

## 2017-01-07 DIAGNOSIS — M25562 Pain in left knee: Secondary | ICD-10-CM | POA: Diagnosis not present

## 2017-01-09 NOTE — Telephone Encounter (Signed)
Cover My Meds called to inquire if we want to appeal the denial for Entresto. Please advise.

## 2017-01-15 NOTE — Telephone Encounter (Signed)
Please let him know to come in for an office visit and I can discuss the medications.  Since insurance will not cover it we can just change it.

## 2017-01-16 ENCOUNTER — Telehealth: Payer: Self-pay | Admitting: Family Medicine

## 2017-01-16 NOTE — Telephone Encounter (Signed)
DR Nolon Rod SPOKE WITH PT HE STATES THAT HE'S GOING TO SEE HIS CARDIOLOGIST THAT PUT HIM ON ENTRESTO SO HE DOESN'T NEED TO F/U ON OTHER PRESCRIPTIONS OTHER THAT ENTRESTO

## 2017-01-16 NOTE — Telephone Encounter (Signed)
Please call pt per Dr. Nolon Rod note

## 2017-01-17 MED FILL — VALSARTAN 80 MG TABLET: 80 | 30 days supply | Qty: 60 | Fill #0

## 2017-01-23 ENCOUNTER — Other Ambulatory Visit: Payer: Self-pay | Admitting: Emergency Medicine

## 2017-01-23 DIAGNOSIS — M25472 Effusion, left ankle: Principal | ICD-10-CM

## 2017-01-23 DIAGNOSIS — M25471 Effusion, right ankle: Secondary | ICD-10-CM

## 2017-01-27 ENCOUNTER — Other Ambulatory Visit: Payer: Self-pay | Admitting: Emergency Medicine

## 2017-01-27 DIAGNOSIS — M25471 Effusion, right ankle: Secondary | ICD-10-CM

## 2017-01-27 DIAGNOSIS — M25472 Effusion, left ankle: Principal | ICD-10-CM

## 2017-01-28 ENCOUNTER — Other Ambulatory Visit: Payer: Self-pay | Admitting: Emergency Medicine

## 2017-01-28 ENCOUNTER — Other Ambulatory Visit (HOSPITAL_COMMUNITY): Payer: Self-pay | Admitting: Cardiology

## 2017-01-28 DIAGNOSIS — M25471 Effusion, right ankle: Secondary | ICD-10-CM

## 2017-01-28 DIAGNOSIS — M25472 Effusion, left ankle: Principal | ICD-10-CM

## 2017-01-28 MED FILL — VICTOZA 18 MG/3 ML INJECT P: 18 | 30 days supply | Qty: 9 | Fill #4

## 2017-01-28 MED FILL — LANTUS SOLOSTAR 100 UNITS/M: 100 | 30 days supply | Qty: 12 | Fill #2

## 2017-01-29 ENCOUNTER — Telehealth: Payer: Self-pay | Admitting: Emergency Medicine

## 2017-01-29 MED FILL — CARVEDILOL 6.25 MG TABLET: 6.25 | 30 days supply | Qty: 60 | Fill #0

## 2017-01-29 MED FILL — METFORMIN HCL ER 500 MG TAB: 500 | 30 days supply | Qty: 120 | Fill #0

## 2017-01-29 MED FILL — UNIFINE PENTIPS 32GX5/32: 32G X 4 MM | 25 days supply | Qty: 100 | Fill #0

## 2017-01-29 MED FILL — UNIFINE PENTIPS 32GX5/32": 32G X 4 MM | 25 days supply | Qty: 100 | Fill #0

## 2017-01-29 MED FILL — GLIMEPIRIDE 4 MG TABLET: 4 | 30 days supply | Qty: 60 | Fill #0

## 2017-01-29 MED FILL — ATORVASTATIN 40 MG TABLET: 40 | 30 days supply | Qty: 30 | Fill #0

## 2017-01-29 MED FILL — FUROSEMIDE 40 MG TAB: 40 | 30 days supply | Qty: 30 | Fill #0

## 2017-01-29 MED FILL — NABUMETONE 500 MG TABLET: 500 | 30 days supply | Qty: 60 | Fill #2

## 2017-01-31 NOTE — Progress Notes (Signed)
Cardiology Office Note Date:  02/04/2017  Patient ID:  Paul, [name] 18-Jul-1945, MRN [id number] PCP:  Dr. Rich Number CHF:  Dr. Aundra Dubin Electrophysiologist: Dr. Caryl Comes   Chief Complaint:  over-due EP visit  History of Present Illness: Paul [name] is a 71 y.o. 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  He comes today to be seen for Dr. Caryl Comes, last seen by him back in Sept 2016, at that time he was pending perhaps a repeat VT ablation at [facility], Dr. Caryl Comes made note of a "bipolar R wave of 2 mV. There is integrated bipolar measurement of about 5 mV,  in discussion with [employer]. The recommendation is that if the integrated bipolar vector is to be used DFT testing should be undertaken. This seems imminently reasonable."    He last saw Dr. Dwana Curd in October, he makes note that his last EPS he had epicardial and endocardial mapping and ablation as well as mapping in his coronary venous system but no signals markedly early suggesting a mid myocardial inferoseptal origin and and his PVCs were not suppressed.  The patient reports no longer f/u there, discharged to return f/u here and with Dr. Aundra Dubin.  He tells me he is feeling well, still working.  Orthopedic limitations, denies DOE, no CP, palpitations, no dizziness, near syncope or syncope, no symptoms of orthopnea or PND.  He was treated with Xarelto for his DVT for a year and reports his PMD had him stop and maintained on ASA 318m daily.  He has dependent edema, wears support stockings that help, reporting by the end of the day his LE have swelling.  He reports his PMD is managing his lipids, noting last year's elevated, though had adjustment in his lipitor then.  He is not  doing remotes, thinks his transmitter is still packed after moving.    Device information: MDT dual chamber ICD, 05/17/10, Dr. KCaryl Comes NICM, VT AAD Hx: mexiletine stopped 2/2 not being effective in minimizing PVCs  EPS/ablation hx 09/2014, Dr. KDwana Curd12/26/14, Dr. ARayann Heman6/1/20??, Dr. KDwana Curd patient denies , reports only the 2 Alation procedures above  Past Medical History:  Diagnosis Date  . Arthritis    "thumbs, neck, knees" (06/01/2013)  . Cardiomyopathy, dilated, nonischemic ([place] 10/20/2012  . Cataract   . CHF (congestive heart failure) ([place]    "mild" (06/01/2013)  . Erectile dysfunction   . GERD (gastroesophageal reflux disease)   . Hypertension   . Hypogonadism male   . Hypovitaminosis D   . 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 Type II diabetes mellitus ([place]   . Ventricular tachycardia ([place]    observed during stress testing, subsequent VT on EPS    Past Surgical History:  Procedure Laterality Date  . ABLATION  06-01-2013   PVC's ablated along basal inferoseptal RV by Dr ARayann Heman . APPENDECTOMY  1969  . CARDIAC CATHETERIZATION  04/2010  . CARDIAC DEFIBRILLATOR PLACEMENT  05/09/2010   [place] Barban . CATARACT EXTRACTION W/ INTRAOCULAR LENS  IMPLANT, BILATERAL Bilateral   . KNEE ARTHROSCOPY Left 1984  . LUMBAR LAMINECTOMY  ~ 2004  . TONSILLECTOMY AND ADENOIDECTOMY  67  . V-TACH ABLATION N/A 06/01/2013   Procedure: V-TACH ABLATION;  Surgeon: Coralyn Mark, MD;  Location: [facility];  Service: Cardiovascular;  Laterality: N/A;  . VASECTOMY    . VENTRICULAR ABLATION SURGERY  06/01/2013    Current Outpatient Prescriptions  Medication Sig Dispense Refill  . atorvastatin (LIPITOR) 40 MG tablet Take 1 tablet (40 mg total) by mouth daily. 90 tablet 3  . Blood Glucose Monitoring Suppl (BLOOD GLUCOSE MONITOR SYSTEM) w/Device KIT Glucose monitor to check blood sugars tid dx code E11.9  brand per insurance coverage 1 each 0  . carvedilol (COREG) 6.25 MG tablet Take 1 tablet (6.25 mg total) by mouth 2 (two) times daily. 180 tablet 3  . Cholecalciferol (CVS VIT D 5000 HIGH-POTENCY PO) Take 10,000 Units by mouth daily.    . furosemide (LASIX) 40 MG tablet Take 40 mg by mouth daily.    Marland Kitchen glimepiride (AMARYL) 4 MG tablet Take 1 twice a day 180 tablet 3  . glucose blood (BAYER CONTOUR NEXT TEST) test strip USE TO CHECK BLOOD SUGARS THREE TIMES DAILY 100 each 12  . Insulin Glargine (LANTUS SOLOSTAR) 100 UNIT/ML Solostar Pen Inject 30 Units into the skin at bedtime. 15 mL 5  . Insulin Pen Needle (CLICKFINE PEN NEEDLES) 32G X 4 MM MISC Use 2x a day - with Lantus and Victoza. 200 each 11  . Lancets MISC Glucose monitor to check blood sugars tid dx code E11.9 brand per insurance coverage 100 each 11  . metFORMIN (GLUCOPHAGE-XR) 500 MG 24 hr tablet Take 4 tablets (2,000 mg total) by mouth daily with breakfast. 360 tablet 3  . sacubitril-valsartan (ENTRESTO) 49-51 MG Take 1 tablet by mouth 2 (two) times daily. 180 tablet 3  . testosterone (ANDROGEL) 50 MG/5GM (1%) GEL Place 5 g onto the skin daily. 150 g 5  . VICTOZA 18 MG/3ML SOPN INJECT 1.8 MG INTO THE SKIN DAILY. 2 pen 0   No current facility-administered medications for this visit.     Allergies:   Lisinopril   Social History:  The patient  reports that he has never smoked. He has never used smokeless tobacco. He reports that he drinks about 1.2 oz of alcohol per week . He reports that he does not use drugs.   Family History:  The patient's family history includes Alcohol abuse in his brother and mother; COPD in his father and mother; Cancer in his father; Coronary artery disease in his brother; Depression in his mother; Diabetes in his sister; Drug abuse in his brother; Heart disease in his brother, father, and mother; Hypertension in his brother, father, mother, and sister; Learning disabilities in his sister; Mental retardation in  his sister; Stroke in his father and mother.  ROS:  Please see the history of present illness.  All other systems are reviewed and otherwise negative.   PHYSICAL EXAM:  VS:  BP (!) 146/90   Pulse 81   Ht 5' 11" (1.803 m)   Wt 246 lb (111.6 kg)   BMI 34.31 kg/m  BMI: Body mass index is 34.31 kg/m. Well nourished, well developed, in no acute distress  HEENT: normocephalic, atraumatic  Neck: no JVD, carotid bruits or masses Cardiac:  RRR; no significant murmurs, no rubs, or gallops Lungs:  CTA b/l, no wheezing, rhonchi or rales  Abd: soft, nontender, obese MS: no deformity or atrophy Ext: trace edema  Skin: warm and dry, no rash Neuro:  No gross deficits appreciated Psych: euthymic mood, full affect  ICD site is stable, no tethering or discomfort   EKG:  Done today and reviewed by myselfshows SR, RBBB, LAD, appears similarto previous ICD interrogation done today by industry and reviewed by myself: battery voltage is at 2.6 though not triggered RRT yet, lead measurements are consistent with last interrogation done a year ago, known to chronically have low R waves , remains bipolar configuration.  8 NSVT episodes look SVT, he has had AF, longest 15 minutes Nov 2017  06/22/14: TTE Study Conclusions - Left ventricle: Diffuse hypokinesis with abnormal septal motion The cavity size was mildly dilated. Wall thickness was normal. Systolic function was moderately to severely reduced. The estimated ejection fraction was in the range of 30% to 35%. Doppler parameters are consistent with abnormal left ventricular relaxation (grade 1 diastolic dysfunction). - Left atrium: The atrium was mildly dilated. - Atrial septum: No defect or patent foramen ovale was identified.   CARDIAC CATHETERIZATION 09/22/14: ([facility]) Diagnostic Findings Coronary arteries Dominance: co-dominant Left main: normal LAD: mid 50%; distal 90% LCx: prox 100%; OM2 90% - medium RCA: distal 70% -  present Hemodynamics (mm Hg) Ao/BP (asc Ao): 106/59 Mean: 75 mmHg Interventions Lesion: Distal RCA 70% to normal STENT, XIENCE ALPINE RX 2.5X18 Lesion: Distal LAD 90% to normal CATH, DILAT BALLOON EUPHORA 2.0X6MM STENT, XIENCE ALPINE RX 2.5X12    Recent Labs: No results found for requested labs within last 8760 hours.  No results found for requested labs within last 8760 hours.   CrCl cannot be calculated (Patient's most recent lab result is older than the maximum 21 days allowed.).   Wt Readings from Last 3 Encounters:  02/04/17 246 lb (111.6 kg)  01/20/16 236 lb (107 kg)  06/30/15 227 lb 9.6 oz (103.2 kg)     Other studies reviewed: Additional studies/records reviewed today include: summarized above  ASSESSMENT AND PLAN:  1. NICM     On BB/ARB (needs Entresto refills)     Exam does not suggest fluid OL, OptiVol is below threshold  2. ICD     Battery is close to RRT, voltage is 2.60V, lead measurements look stable from prior     He was demonstrated alert tone, and he states will get his transmitter and plug it in, he will call the device clinic once he has located it and make a transmission to ensure is working     He is reluctant about a f/u visit though discussed importance, and will have a follow up at the devic clinic scheduled for 2 weeks given current voltage and at least for now, no home transmissions  3. PVC's/VT, hx of unsuccessful ablations     No VT   4. CAD     No symptoms of angina     Recommend ASA 29m daily, continue BB, statin     He tells me he sees Dr. MAundra Dubinto re-establish with him next month  5. HTN     Out of his entresto, auth refills, and asked MA to make prior-auth department aware he will need   6. HLD     maaged with his PMD  Disposition: F/u with Dr. MAundra Dubinnext month, Dr. KCaryl Comesin 2 months, sooner if needed, anticipate need for gen change soon, device clinic visit in the next 2 weeks.  Current medicines are reviewed at length with  the patient today.  The patient did not have any concerns regarding medicines.  SHaywood Lasso PA-C 02/04/2017 8:25 AM     [facility] [address]  [place] [address] [phone] (office)  [phone] (fax)

## 2017-02-04 ENCOUNTER — Ambulatory Visit (INDEPENDENT_AMBULATORY_CARE_PROVIDER_SITE_OTHER): Payer: BLUE CROSS/BLUE SHIELD | Admitting: Physician Assistant

## 2017-02-04 VITALS — BP 146/90 | HR 81 | Ht 71.0 in | Wt 246.0 lb

## 2017-02-04 DIAGNOSIS — I472 Ventricular tachycardia: Secondary | ICD-10-CM | POA: Diagnosis not present

## 2017-02-04 DIAGNOSIS — Z95810 Presence of automatic (implantable) cardiac defibrillator: Secondary | ICD-10-CM

## 2017-02-04 DIAGNOSIS — I10 Essential (primary) hypertension: Secondary | ICD-10-CM

## 2017-02-04 DIAGNOSIS — G63 Polyneuropathy in diseases classified elsewhere: Secondary | ICD-10-CM | POA: Diagnosis not present

## 2017-02-04 DIAGNOSIS — I420 Dilated cardiomyopathy: Secondary | ICD-10-CM | POA: Diagnosis not present

## 2017-02-04 DIAGNOSIS — I2510 Atherosclerotic heart disease of native coronary artery without angina pectoris: Secondary | ICD-10-CM

## 2017-02-04 DIAGNOSIS — Z794 Long term (current) use of insulin: Secondary | ICD-10-CM | POA: Diagnosis not present

## 2017-02-04 DIAGNOSIS — E782 Mixed hyperlipidemia: Secondary | ICD-10-CM | POA: Diagnosis not present

## 2017-02-04 DIAGNOSIS — I5022 Chronic systolic (congestive) heart failure: Secondary | ICD-10-CM

## 2017-02-04 DIAGNOSIS — Z0000 Encounter for general adult medical examination without abnormal findings: Secondary | ICD-10-CM | POA: Diagnosis not present

## 2017-02-04 DIAGNOSIS — Z79899 Other long term (current) drug therapy: Secondary | ICD-10-CM

## 2017-02-04 DIAGNOSIS — E349 Endocrine disorder, unspecified: Secondary | ICD-10-CM | POA: Diagnosis not present

## 2017-02-04 DIAGNOSIS — E1169 Type 2 diabetes mellitus with other specified complication: Secondary | ICD-10-CM | POA: Diagnosis not present

## 2017-02-04 DIAGNOSIS — Z1211 Encounter for screening for malignant neoplasm of colon: Secondary | ICD-10-CM | POA: Diagnosis not present

## 2017-02-04 DIAGNOSIS — Z125 Encounter for screening for malignant neoplasm of prostate: Secondary | ICD-10-CM | POA: Diagnosis not present

## 2017-02-04 MED ORDER — SACUBITRIL-VALSARTAN 49-51 MG PO TABS: 1.0000 | ORAL_TABLET | Freq: Two times a day (BID) | ORAL | 3 refills | Status: DC

## 2017-02-04 NOTE — Patient Instructions (Signed)
Medication Instructions:   START TAKING ASPIRIN 81 MG ONCE A DAY   If you need a refill on your cardiac medications before your next appointment, please call your pharmacy.  Labwork: NONE ORDERED  TODAY    Testing/Procedures:  NONE ORDERED  TODAY    Follow-Up: DEVICE CLINIC CHECK IN 2 TO 3 WEEKS   WITH DR Caryl Comes IN 2 TO 3 WEEKS    Any Other Special Instructions Will Be Listed Below (If Applicable).

## 2017-02-19 ENCOUNTER — Encounter: Payer: Self-pay | Admitting: Internal Medicine

## 2017-02-20 MED FILL — ENTRESTO 49 MG-51 MG TABLET: 49-51 | 30 days supply | Qty: 60 | Fill #0

## 2017-02-21 MED FILL — NABUMETONE 500 MG TABLET: 500 | 30 days supply | Qty: 60 | Fill #0

## 2017-02-26 MED FILL — ATORVASTATIN 40 MG TABLET: 40 | 30 days supply | Qty: 30 | Fill #0

## 2017-02-26 MED FILL — CARVEDILOL 6.25 MG TABLET: 6.25 | 30 days supply | Qty: 60 | Fill #0

## 2017-02-26 MED FILL — LANTUS SOLOSTAR 100 UNITS/M: 100 | 30 days supply | Qty: 12 | Fill #3

## 2017-02-26 MED FILL — FUROSEMIDE 40 MG TAB: 40 | 30 days supply | Qty: 30 | Fill #1

## 2017-02-26 MED FILL — METFORMIN HCL ER 500 MG TAB: 500 | 30 days supply | Qty: 120 | Fill #0

## 2017-02-26 MED FILL — VICTOZA 18 MG/3 ML INJECT P: 18 | 30 days supply | Qty: 9 | Fill #5

## 2017-02-26 MED FILL — GLIMEPIRIDE 4 MG TABLET: 4 | 30 days supply | Qty: 60 | Fill #0

## 2017-02-27 ENCOUNTER — Telehealth: Payer: Self-pay | Admitting: Internal Medicine

## 2017-02-27 NOTE — Telephone Encounter (Signed)
I called and spoke with the patient- I advised him that the appointment for tomorrow with the device clinic was appropriate to just readdress the status of the battery. I also advised him that he will not typically see the physician until the battery trips and then an office visit will be set up to have the discussion about whether to change out the battery or not.   He states he does have his transmitter box at home now and it is plugged in- he reports having sent a manuel transmission in at least once since he saw [place]. I advised him if he has the transmitter plugged in and it is working, then we should be able to follow his battery remotely. 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   He is aware I will notify the device clinic- appt for tomorrow cancelled. He is also reporting that he will need knee surgery in the mid Oct- early Nov. He is aware he has an appointment with Dr. Aundra Dubin on 03/17/17 and surgical clearance can be addressed at that time with him.  He is agreeable.

## 2017-02-27 NOTE — Telephone Encounter (Signed)
New message    Called pt about appt tomorrow with the device clinic. He said he is supposed to see Dr. Caryl Comes. Told him what print out from last visit with Joseph Art, he said he's supposed to see Dr. Caryl Comes because of a battery change and having to have knee surgery. Pt would like to talk to nurse. Please call.

## 2017-03-04 NOTE — Telephone Encounter (Signed)
Remote scheduled for 03/17/2017 for battery check.

## 2017-03-06 ENCOUNTER — Telehealth: Payer: Self-pay | Admitting: Cardiology

## 2017-03-06 NOTE — Telephone Encounter (Signed)
Informed patient that his device reached RRT on 03-06-2017. Informed him that a scheduler will be in touch with him to schedule an appt w/ MD. Pt verbalized understanding.

## 2017-03-17 ENCOUNTER — Telehealth: Payer: Self-pay | Admitting: Cardiology

## 2017-03-17 ENCOUNTER — Encounter: Payer: BLUE CROSS/BLUE SHIELD | Admitting: *Deleted

## 2017-03-17 ENCOUNTER — Telehealth (HOSPITAL_COMMUNITY): Payer: Self-pay | Admitting: Cardiology

## 2017-03-17 ENCOUNTER — Ambulatory Visit (INDEPENDENT_AMBULATORY_CARE_PROVIDER_SITE_OTHER): Payer: BLUE CROSS/BLUE SHIELD | Admitting: Internal Medicine

## 2017-03-17 ENCOUNTER — Encounter (HOSPITAL_COMMUNITY): Payer: Self-pay | Admitting: Cardiology

## 2017-03-17 ENCOUNTER — Ambulatory Visit (HOSPITAL_COMMUNITY)
Admission: RE | Admit: 2017-03-17 | Discharge: 2017-03-17 | Disposition: A | Discharge status: Home / Self Care | Payer: BLUE CROSS/BLUE SHIELD | Source: Ambulatory Visit | Attending: Cardiology | Admitting: Cardiology

## 2017-03-17 ENCOUNTER — Encounter: Payer: Self-pay | Admitting: Internal Medicine

## 2017-03-17 VITALS — BP 132/80 | HR 70 | Wt 245.0 lb

## 2017-03-17 VITALS — BP 132/76 | HR 65 | Wt 245.5 lb

## 2017-03-17 DIAGNOSIS — I493 Ventricular premature depolarization: Secondary | ICD-10-CM | POA: Diagnosis not present

## 2017-03-17 DIAGNOSIS — Z823 Family history of stroke: Secondary | ICD-10-CM | POA: Diagnosis not present

## 2017-03-17 DIAGNOSIS — I130 Hypertensive heart and chronic kidney disease with heart failure and stage 1 through stage 4 chronic kidney disease, or unspecified chronic kidney disease: Secondary | ICD-10-CM | POA: Diagnosis not present

## 2017-03-17 DIAGNOSIS — E1122 Type 2 diabetes mellitus with diabetic chronic kidney disease: Secondary | ICD-10-CM | POA: Insufficient documentation

## 2017-03-17 DIAGNOSIS — G4733 Obstructive sleep apnea (adult) (pediatric): Secondary | ICD-10-CM | POA: Diagnosis not present

## 2017-03-17 DIAGNOSIS — Z79899 Other long term (current) drug therapy: Secondary | ICD-10-CM | POA: Diagnosis not present

## 2017-03-17 DIAGNOSIS — E291 Testicular hypofunction: Secondary | ICD-10-CM | POA: Diagnosis not present

## 2017-03-17 DIAGNOSIS — I429 Cardiomyopathy, unspecified: Secondary | ICD-10-CM | POA: Diagnosis not present

## 2017-03-17 DIAGNOSIS — K219 Gastro-esophageal reflux disease without esophagitis: Secondary | ICD-10-CM | POA: Diagnosis not present

## 2017-03-17 DIAGNOSIS — Z794 Long term (current) use of insulin: Secondary | ICD-10-CM | POA: Insufficient documentation

## 2017-03-17 DIAGNOSIS — I2510 Atherosclerotic heart disease of native coronary artery without angina pectoris: Secondary | ICD-10-CM

## 2017-03-17 DIAGNOSIS — Z95810 Presence of automatic (implantable) cardiac defibrillator: Secondary | ICD-10-CM | POA: Insufficient documentation

## 2017-03-17 DIAGNOSIS — E1142 Type 2 diabetes mellitus with diabetic polyneuropathy: Secondary | ICD-10-CM | POA: Diagnosis not present

## 2017-03-17 DIAGNOSIS — N189 Chronic kidney disease, unspecified: Secondary | ICD-10-CM | POA: Insufficient documentation

## 2017-03-17 DIAGNOSIS — I5022 Chronic systolic (congestive) heart failure: Secondary | ICD-10-CM | POA: Insufficient documentation

## 2017-03-17 DIAGNOSIS — Z7982 Long term (current) use of aspirin: Secondary | ICD-10-CM | POA: Diagnosis not present

## 2017-03-17 DIAGNOSIS — E785 Hyperlipidemia, unspecified: Secondary | ICD-10-CM | POA: Insufficient documentation

## 2017-03-17 DIAGNOSIS — E1165 Type 2 diabetes mellitus with hyperglycemia: Secondary | ICD-10-CM

## 2017-03-17 MED ORDER — SPIRONOLACTONE 25 MG PO TABS: 12.5000 mg | ORAL_TABLET | Freq: Every day | ORAL | 3 refills | Status: DC

## 2017-03-17 MED ORDER — INSULIN GLARGINE 100 UNIT/ML SOLOSTAR PEN: 38.0000 [IU] | PEN_INJECTOR | Freq: Every day | SUBCUTANEOUS | 11 refills | Status: DC

## 2017-03-17 MED ORDER — CANAGLIFLOZIN 100 MG PO TABS: 100.0000 mg | ORAL_TABLET | Freq: Every day | ORAL | 5 refills | Status: DC

## 2017-03-17 MED FILL — INVOKANA 100 MG TABLET: 100 | 30 days supply | Qty: 30 | Fill #0

## 2017-03-17 MED FILL — SPIRONOLACTONE 25 MG TABLET: 25 | 30 days supply | Qty: 15 | Fill #0

## 2017-03-17 NOTE — Patient Instructions (Addendum)
Please continue: - Metformin XR 2000 mg at bedtime - Victoza 1.8 mg daily in am - Amaryl 4 mg 2x a day, before meals.  Please restart: - Invokana 100 mg in am  Decrease: - Lantus 38 units at bedtime  Please have labs fasting, at 8 am.  Please return in 3 months with your sugar log.

## 2017-03-17 NOTE — Patient Instructions (Addendum)
Take Atorvastatin 40 mg ever day  Start Spironolactone 12.5 mg (1/2 tab) daily  Your physician recommends that you return for lab work in: 2 weeks at PCP  Your physician has requested that you have an echocardiogram. Echocardiography is a painless test that uses sound waves to create images of your heart. It provides your doctor with information about the size and shape of your heart and how well your heart's chambers and valves are working. This procedure takes approximately one hour. There are no restrictions for this procedure.  Your physician recommends that you schedule a follow-up appointment in: 4 month

## 2017-03-17 NOTE — Progress Notes (Signed)
Patient ID: Paul [name], male   DOB: 08-Mar-1946, 71 y.o.   MRN: [id number]  HPI: Paul [name] is a 71 y.o.-year-old male, returning for f/u for DM2, dx in 2001, uncontrolled, with complications (CKD, mild PN, ED). Last visit 1 year and 9 mo ago!  DM2: Last hemoglobin A1c was: Lab Results  Component Value Date   HGBA1C 8.5 01/20/2016   HGBA1C 8.1 06/30/2015   HGBA1C 9.8 01/18/2015  09/23/2014: 8.8%  Pt is on a regimen of: - Metformin XR 2000 mg at bedtime - Victoza 1.8 mg daily in am - Amaryl 4 mg 2x a day, before meals.  - Lantus 12 >> 44 units at bedtime  Pt checks his sugars 1x a day and they are: - am:  130-203, 220 - better after increasing Invokana >> 136-180, 210 >> 120-140 - 2h after b'fast: n/c - before lunch: n/c - 2h after lunch: n/c - before dinner: n/c >> 274 >> n/c - 2h after dinner: n/c - bedtime: n/c >> 243-284 >> n/c - nighttime: n/c >> 65, 70s 2x a week Lowest sugar was 163 >> 130 >> 100 >> 65; he has hypoglycemia awareness at 70s. Highest sugar was 400s >> 200s.  Glucometer: OneTouch Ultra  Pt's meals are: - Breakfast: cereals + blueberries - Lunch: chinese food or sandwich, fruit - Dinner: meat + veggies or salad - Snacks: no  He used Nutrisystem 2x before >> 40 lbs lost.   - + CKD: 02/19/2017: 19/1.37, GFR 52, glucose 143 Lab Results  Component Value Date   BUN 25 01/20/2016   CREATININE 1.21 01/20/2016  09/23/2014: 13/1.1, GFR >60 Angioedema with ACEI. On Entresto. 02/19/2017: ACR 247 No results found for: MICRALBCREAT - but apparently this was high in the past  - last set of lipids: 02/19/2017: 149/218/30/87  Lab Results  Component Value Date   CHOL 188 01/20/2016   HDL 34 (L) 01/20/2016   LDLCALC 103 01/20/2016   TRIG 255 (H) 01/20/2016   CHOLHDL 5.5 (H) 01/20/2016  09/2014: 158/157/25/100 On Lipitor 40. - last eye exam was in 2018 >> No DR. She had a new small area of retinal detachment. Dr Rodena Piety . - he has numbness  and tingling in his L foot  On Lasix 60 alternating with 40 mg qod.  He has a h/o hypogonadism, - he could not start Androgel b/c not covered by his insurance. His latest testosterone level was normal off Androgel:  Last test. level: Component     Latest Ref Rng & Units 01/20/2016  HCT     43.5 - 53.7 % 41.4 (A)  Testosterone,Total,LC/MS/MS     250 - 1,100 ng/dL 336  Testosterone, Free     35.0 - 155.0 pg/mL 66.2  Sex Hormone Binding Glob.     22 - 77 nmol/L 27   Lab Results  Component Value Date   PSA 0.9 01/20/2016   PSA 1.50 07/06/2015   PSA 1.16 10/16/2012   02/19/2017: Hemoglobin 14 (13.2-17.1), hematocrit 40.5 (38.5-50)  Patient has a history of trigeminy, status post endocardial ablation at [facility]. He had an epicardial ablation in 08/2014. During that time, he had 2 stents latest, and the ablation procedure was postponed. He is followed by Dr. Dwana Curd. He has obstructive sleep apnea, now on CPAP. H/o DVT L leg >> more swollen.  ROS: Constitutional: + weight gain/no weight loss, no fatigue, no subjective hyperthermia, no subjective hypothermia Eyes: no blurry vision, no xerophthalmia ENT: no sore throat, no nodules palpated  in throat, no dysphagia, no odynophagia, no hoarseness Cardiovascular: no CP/no SOB/no palpitations/+ leg swelling Respiratory: no cough/no SOB/no wheezing Gastrointestinal: no N/no V/no D/no C/no acid reflux Musculoskeletal: no muscle aches/no joint aches Skin: no rashes, no hair loss Neurological: no tremors/+ numbness/no tingling/no dizziness  I reviewed pt's medications, allergies, PMH, social hx, family hx, and changes were documented in the history of present illness. Otherwise, unchanged from my initial visit note.  Past Medical History:  Diagnosis Date  . Arthritis    "thumbs, neck, knees" (06/01/2013)  . Cardiomyopathy, dilated, nonischemic ([place]) 10/20/2012  . Cataract   . CHF (congestive heart failure) ([place])    "mild" (06/01/2013)  .  Erectile dysfunction   . GERD (gastroesophageal reflux disease)   . Hypertension   . Hypogonadism male   . Hypovitaminosis D   . 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 Type II diabetes mellitus ([place])   . Ventricular tachycardia ([place])    observed during stress testing, subsequent VT on EPS   Past Surgical History:  Procedure Laterality Date  . ABLATION  06-01-2013   PVC's ablated along basal inferoseptal RV by Dr Rayann Heman  . APPENDECTOMY  1969  . CARDIAC CATHETERIZATION  04/2010  . CARDIAC DEFIBRILLATOR PLACEMENT  05/09/2010   MDT ICD implanted in [place] by Dr Ky Barban  . CATARACT EXTRACTION W/ INTRAOCULAR LENS  IMPLANT, BILATERAL Bilateral   . KNEE ARTHROSCOPY Left 1984  . LUMBAR LAMINECTOMY  ~ 2004  . TONSILLECTOMY AND ADENOIDECTOMY  1972  . V-TACH ABLATION N/A 06/01/2013   Procedure: V-TACH ABLATION;  Surgeon: Coralyn Mark, MD;  Location: [facility] CATH LAB;  Service: Cardiovascular;  Laterality: N/A;  . VASECTOMY    . VENTRICULAR ABLATION SURGERY  06/01/2013   History   Social History  . Marital Status: Married    Spouse Name: N/A  . Number of Children: 2   Occupational History  . physician   Social History Main Topics  . Smoking status: Never Smoker   . Smokeless tobacco: Never Used  . Alcohol Use: 1.2 oz/week    1 Glasses scotch, rarely  . 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    Moved here from [facility] 1.5 years ago   Current Outpatient Prescriptions on File Prior to Visit  Medication Sig Dispense Refill  . aspirin EC 81 MG tablet Take 81 mg by mouth daily.    Marland Kitchen atorvastatin (LIPITOR) 40 MG tablet Take 1 tablet (40 mg total) by mouth daily. 90 tablet 3  . Blood Glucose Monitoring Suppl (BLOOD GLUCOSE MONITOR SYSTEM) w/Device KIT Glucose monitor to check blood sugars tid dx code E11.9 brand per insurance coverage  1 each 0  . carvedilol (COREG) 6.25 MG tablet Take 1 tablet (6.25 mg total) by mouth 2 (two) times daily. 180 tablet 3  . Cholecalciferol (CVS VIT D 5000 HIGH-POTENCY PO) Take 10,000 Units by mouth daily.    . furosemide (LASIX) 40 MG tablet Take 40 mg by mouth daily.    Marland Kitchen glimepiride (AMARYL) 4 MG tablet Take 1 twice a day 180 tablet 3  . glucose blood (BAYER CONTOUR NEXT TEST) test strip USE TO CHECK BLOOD SUGARS THREE TIMES DAILY 100 each 12  . Insulin Glargine (LANTUS SOLOSTAR) 100 UNIT/ML Solostar Pen Inject 30 Units into the skin at bedtime. (Patient taking differently: Inject 40 Units into the skin at bedtime. )  15 mL 5  . Insulin Pen Needle (CLICKFINE PEN NEEDLES) 32G X 4 MM MISC Use 2x a day - with Lantus and Victoza. 200 each 11  . Lancets MISC Glucose monitor to check blood sugars tid dx code E11.9 brand per insurance coverage 100 each 11  . metFORMIN (GLUCOPHAGE-XR) 500 MG 24 hr tablet Take 4 tablets (2,000 mg total) by mouth daily with breakfast. 360 tablet 3  . sacubitril-valsartan (ENTRESTO) 49-51 MG Take 1 tablet by mouth 2 (two) times daily. 180 tablet 3  . VICTOZA 18 MG/3ML SOPN INJECT 1.8 MG INTO THE SKIN DAILY. 2 pen 0  . testosterone (ANDROGEL) 50 MG/5GM (1%) GEL Place 5 g onto the skin daily. (Patient not taking: Reported on 03/17/2017) 150 g 5   No current facility-administered medications on file prior to visit.    Allergies  Allergen Reactions  . Lisinopril     angioedema   Family History  Problem Relation Age of Onset  . Alcohol abuse Mother   . COPD Mother   . Depression Mother   . Heart disease Mother   . Hypertension Mother   . Stroke Mother   . COPD Father   . Heart disease Father   . Hypertension Father   . Stroke Father   . Cancer Father   . Diabetes Sister   . Mental retardation Sister   . Learning disabilities Sister   . Hypertension Sister   . Alcohol abuse Brother   . Drug abuse Brother   . Heart disease Brother   . Hypertension Brother   .  Coronary artery disease Brother    PE: BP 132/80 (BP Location: Left Arm, Patient Position: Sitting)   Pulse 70   Wt 245 lb (111.1 kg)   SpO2 98%   BMI 34.17 kg/m  Body mass index is 34.17 kg/m. Wt Readings from Last 3 Encounters:  03/17/17 245 lb (111.1 kg)  03/17/17 245 lb 8 oz (111.4 kg)  02/04/17 246 lb (111.6 kg)   Constitutional: overweight, in NAD Eyes: PERRLA, EOMI, no exophthalmos ENT: moist mucous membranes, no thyromegaly, no cervical lymphadenopathy Cardiovascular: RRR, No MRG, + B pitting edema -  Compression hoses Respiratory: CTA B Gastrointestinal: abdomen soft, NT, ND, BS+ Musculoskeletal: no deformities, strength intact in all 4 Skin: moist, warm, no rashes Neurological: no tremor with outstretched hands, DTR normal in all 4  ASSESSMENT: 1. DM2, non-insulin-dependent, uncontrolled, with complications - CKD - mild PN - ED  2. Hypogonadism  PLAN:  1. Patient with long-standing, uncontrolled diabetes, on oral antidiabetic regimen, GLP 1 receptor agonist, and basal insulin added at last visit. He increased the dose of Lantus since last visit to 44 units nightly. His sugars are at goal in the morning, however, his HbA1c was 8.1% recently, meaning that his sugars increase throughout the day. I suggested to add back iNVOKANA a lower dose than before but we need to be vigilant with his potassium and his degree of dehydration, since he is prone to DVTs in his left leg. He is currently on Lasix daily and will add spironolactone low-dose today. - reviewed recent HbA1c >> 8.1% (slightly better) - I suggested to:  Patient Instructions  Please continue: - Metformin XR 2000 mg at bedtime - Victoza 1.8 mg daily in am - Amaryl 4 mg 2x a day, before meals.  Please restart: - Invokana 100 mg in am  Decrease: - Lantus 38 units at bedtime  Please have labs fasting, at 8 am.  Please return  in 3 months with your sugar log.   - continue checking sugars at different times  of the day - check 1-2x a day, rotating checks - advised for yearly eye exams >> he is UTD - Return to clinic in 3 mo with sugar log   2. Hypogonadism - he was previously on Androgel, however, this was not approved in last 2 years - reviewed his prev. Testosterone levels: latest level was normal off testosterone supplemmentation  - will recheck a total and a free testosterone fasting, along with PSA. Recent Hb normal.  Philemon Kingdom, MD PhD [facility]

## 2017-03-17 NOTE — Telephone Encounter (Signed)
Spoke with pt and reminded pt of remote transmission that is due today. Pt verbalized understanding.   

## 2017-03-17 NOTE — Progress Notes (Signed)
Patient ID: Paul [name], male   DOB: 10/04/1945, 71 y.o.   MRN: [id number] PCP: Dr. Marzetta Board EP: Dr. Caryl Comes Cardiology: Dr. 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 Patient has had a cardiomyopathy known since around 2011.  Cath at that time showed nonobstructive CAD but EF 25%.  He got a Medtronic ICD.  Initial workup was in [place], and he subsequently moved to [place].  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    He has been noted to have frequent PVCs, at one point up to 1/3 of beats.  VT ablation did not help much, but mexiletine seemed to cut back on PVC frequency.  He is no longer taking mexiletine. Dr Ouida Sills felt like his PVCs increased again.  He was seen at [facility] for possible VT ablation.  During that visit, his ICD was interrogated and PVCs were noted to be up to > 1000/hr with short runs NSVT.  Echo was repeated in 1/16 and showed EF down to 30-35%.  Also of note, patient had an untriggered DVT in 12/15 and was on Xarelto for a time but is no longer taking it. He was diagnosed with OSA and is on CPAP.   He had VT mapping at [facility] in 4/16, mapped to inferior septum.  He had LHC the next day, showing 90% distal LAD and 90% PDA.  He had DES to both lesions.  He was sent home on ASA 325, Plavix 75, and Xarelto 20.  Plan was to return 1 year after DES placement to have epicardial VT ablation (off Plavix).    In 6/17, he had unsuccessful epicardial/endocardial VT ablation at [facility]. Suspect mid-myocardial focus.   Main complaint is bilateral knee pain from OA.  He has no significant exertional dyspnea. No problems climbing a flight of steps except with knee pain.  No orthopnea/PND. No chest pain. Feels palpitations at times. No lightheadedness/syncope.   Optivol: Stable impedance, fluid index < threshold.  No VT.  2 short < 2 minute runs of possible atrial fibrillation since last  interrogation.      Labs (10/15): K 4.7, creatinine 1.6, LDL 84, HDL 35 Labs (12/15): K 4.8, creatinine 1.23 Labs (2/16): K 4.5, creatinine 1.4 Labs (8/17): LDL 103 Labs (9/18): LDL 87, HDL 30, TGs 218, K 4.1, creatinine 1.37, hgb 14  ECG (8/18, personally reviewed): NSR, RBBB, LAFB  PMH: 1. Nonischemic cardiomyopathy: Diagnosed in [place] in 2011.  Possibly related to frequent PVCs. Medtronic ICD placed in 2011.  Echo (6/14) with EF 35%.  Echo (10/15) with EF 40-45% (difficult with PVCs), mildly dilated RV with moderately decreased RV systolic function.  Echo (1/16) with EF 30-35%, mildly dilated LV.   2. PVCs: Frequent, at one point up to 1/3 of total beats.  Patient had endocardial ablation in 6/14 that was not successful.  However, PVC count dropped with initiation of mexiletine.  He is now off mexiletine.  - Combined epicardial/endocardial ablation 6/17 at [facility] was unsuccessful, likely due to mid-myocardial focus.  3. HTN 4. Type II diabetes 5. CAD:  LHC (1/11) with 40-50% mLAD stenosis, 90% small OM1 stenosis, EF 25%.  LHC (4/16) witih 90% dLAD and 90% PDA, both treated with DES.  6. GERD 7. Appendectomy 8. Lumbar laminectomy 9. CKD 10. Hyperlipidemia: Myalgias with Crestor.  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  12. 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  FH: Father with MI in his 64s and CVA (smoker), mother with PAD diagnosed in her 8s.   ROS: All systems reviewed and negative except as per HPI.   Current Outpatient Prescriptions  Medication Sig Dispense Refill  . aspirin EC 81 MG tablet Take 81 mg by mouth daily.    Marland Kitchen atorvastatin (LIPITOR) 40 MG tablet Take 1 tablet (40 mg total) by mouth daily. 90 tablet 3  . Blood Glucose Monitoring Suppl (BLOOD GLUCOSE MONITOR SYSTEM) w/Device KIT Glucose monitor to check blood sugars tid dx code E11.9 brand per insurance coverage 1 each 0  . carvedilol (COREG) 6.25  MG tablet Take 1 tablet (6.25 mg total) by mouth 2 (two) times daily. 180 tablet 3  . Cholecalciferol (CVS VIT D 5000 HIGH-POTENCY PO) Take 10,000 Units by mouth daily.    . furosemide (LASIX) 40 MG tablet Take 40 mg by mouth daily.    Marland Kitchen glimepiride (AMARYL) 4 MG tablet Take 1 twice a day 180 tablet 3  . glucose blood (BAYER CONTOUR NEXT TEST) test strip USE TO CHECK BLOOD SUGARS THREE TIMES DAILY 100 each 12  . Insulin Pen Needle (CLICKFINE PEN NEEDLES) 32G X 4 MM MISC Use 2x a day - with Lantus and Victoza. 200 each 11  . Lancets MISC Glucose monitor to check blood sugars tid dx code E11.9 brand per insurance coverage 100 each 11  . metFORMIN (GLUCOPHAGE-XR) 500 MG 24 hr tablet Take 4 tablets (2,000 mg total) by mouth daily with breakfast. 360 tablet 3  . sacubitril-valsartan (ENTRESTO) 49-51 MG Take 1 tablet by mouth 2 (two) times daily. 180 tablet 3  . testosterone (ANDROGEL) 50 MG/5GM (1%) GEL Place 5 g onto the skin daily. (Patient not taking: Reported on 03/17/2017) 150 g 5  . VICTOZA 18 MG/3ML SOPN INJECT 1.8 MG INTO THE SKIN DAILY. 2 pen 0  . canagliflozin (INVOKANA) 100 MG TABS tablet Take 1 tablet (100 mg total) by mouth daily before breakfast. 30 tablet 5  . Insulin Glargine (LANTUS SOLOSTAR) 100 UNIT/ML Solostar Pen Inject 38 Units into the skin at bedtime. 5 pen 11  . spironolactone (ALDACTONE) 25 MG tablet Take 0.5 tablets (12.5 mg total) by mouth daily. 16 tablet 3   No current facility-administered medications for this encounter.    BP 132/76   Pulse 65   Wt 245 lb 8 oz (111.4 kg)   SpO2 100%   BMI 34.24 kg/m  General: NAD Neck: No JVD, no thyromegaly or thyroid nodule.  Lungs: Clear to auscultation bilaterally with normal respiratory effort. CV: Nondisplaced PMI.  Heart regular S1/S2 with occasional extra beats, no S3/S4, no murmur.  1+ ankle edema.  No carotid bruit.  Normal pedal pulses.  Abdomen: Soft, nontender, no hepatosplenomegaly, no distention.  Skin: Intact  without lesions or rashes.  Neurologic: Alert and oriented x 3.  Psych: Normal affect. Extremities: No clubbing or cyanosis.  HEENT: Normal.   Assessment/Plan:  1. Chronic systolic CHF: Nonischemic cardiomyopathy.  Possibly PVC-related as there seemed to have been some improvement in EF with decreasing PVC burdern on mexiletine (EF 40-45%).  With more PVCs off mexiletine, EF down to 30-35% on last echo in 1/16.  He had PCI in 4/16 to distal LAD and PDA, but suspect that his cardiomyopathy is not predominantly ischemic (pre-dated coronary disease and out of proportion to coronary disease).  NYHA II symptoms, he is not volume overloaded.    - I will arrange  for repeat echo.  - Continue current Coreg and Entresto. - Start spironolactone 12.5 mg daily.  BMET 10 days.  - Continue current Lasix.  2. 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 Epicardial/endocardial ablation in 6/17 at [facility] was unsuccessful, likely due to mid-myocardial focus.   - I think he warrants repeat quantification of PVCs with holter monitor => if he continues to have a high burden and EF remains low on echo, would consider restarting mexiletine as this seemed to help in the past.  He will see Dr. Caryl Comes in a couple of weeks, so I will leave the PVC evaluation to EP.    3. CKD:  BMET after spironolactone started.   4. CAD: Nonobstructive CAD on 2011 cath but 4/16 cath at [facility] with distal LAD and PDA stenoses treated with DES.  As above, do not think coronary disease can explain extent of his cardiomyopathy.  He is currently on ASA 81 daily and atorvastatin. - Continue ASA 81.   - LDL mildly above goal of < 70. He is not taking atorvastatin every day.  I asked him to take it daily and will repeat lipids in a couple of months.  5. DVT: Spontaneous (no trigger).  He is now off Xarelto.  6. Type II diabetes: If he needs an additional medication, would consider empagliflozin.   Followup in 4 months.   Loralie Champagne 03/17/2017

## 2017-03-20 NOTE — Telephone Encounter (Signed)
User: [name] A Date/time: 03/17/17 10:45 AM  Comment: Called pt and lmsg for him to CB to get scheduled for an echo.   Context:  Outcome: Left Message  Phone number: [phone] Phone Type: Home Phone  Comm. type: Telephone Call type: Outgoing  Contact: Paul [name] Relation to patient: Self

## 2017-03-21 ENCOUNTER — Other Ambulatory Visit: Payer: Self-pay | Admitting: Cardiology

## 2017-03-26 ENCOUNTER — Telehealth: Payer: Self-pay | Admitting: *Deleted

## 2017-03-26 MED FILL — CARVEDILOL 6.25 MG TABLET: 6.25 | 30 days supply | Qty: 60 | Fill #1

## 2017-03-26 MED FILL — VICTOZA 18 MG/3 ML INJECT P: 18 | 30 days supply | Qty: 9 | Fill #6

## 2017-03-26 MED FILL — METFORMIN HCL ER 500 MG TAB: 500 | 30 days supply | Qty: 120 | Fill #1

## 2017-03-26 MED FILL — ENTRESTO 49 MG-51 MG TABLET: 49-51 | 30 days supply | Qty: 60 | Fill #1

## 2017-03-26 MED FILL — NABUMETONE 500 MG TABLET: 500 | 30 days supply | Qty: 60 | Fill #1

## 2017-03-26 MED FILL — LANTUS SOLOSTAR 100 UNITS/M: 100 | 30 days supply | Qty: 12 | Fill #4

## 2017-03-26 MED FILL — GLIMEPIRIDE 4 MG TABLET: 4 | 30 days supply | Qty: 60 | Fill #1

## 2017-03-26 MED FILL — FUROSEMIDE 40 MG TABS: 40 | 30 days supply | Qty: 30 | Fill #2

## 2017-03-26 MED FILL — ATORVASTATIN 40 MG TABLET: 40 | 30 days supply | Qty: 30 | Fill #1

## 2017-03-26 NOTE — Telephone Encounter (Signed)
   [facility] Pre-operative Risk Assessment    Request for surgical clearance:  1. What type of surgery is being performed? Total Knee Arthroplasty  2. When is this surgery scheduled? TBD upon clearance  3. Are there any medications that need to be held prior to surgery and how long?:Cardiac/Medical clearance: Asprin, unspecified please advise.  4. Practice name and name of physician performing surgery? [facility] Dorna Leitz, MD  5. What is your office phone and fax number? Ph; [phone] Fx: [phone]  8. Anesthesia type (None, local, MAC, general) ? N/A   Paul [name] 03/26/2017, 3:08 PM  _________________________________________________________________   (provider comments below)

## 2017-03-27 NOTE — Telephone Encounter (Signed)
Would get echo first, but think he is stable for surgery.

## 2017-03-27 NOTE — Telephone Encounter (Signed)
This patient was just evaluated by you on 03/17/2017. He is requesting cardiac clearance for total knee arthroplasty. It does not appear he has undergone any recent procedures that would hinder this but EF has been variable as you ordered a repeat echo which is scheduled for 04/08/2017. Is he good for surgery or would you prefer for him to have the echocardiogram prior to this?   Thanks,  [place]

## 2017-03-28 NOTE — Telephone Encounter (Signed)
Per Dr. Aundra Dubin, repeat echocardiogram. If EF stable or improved, he is cleared.

## 2017-03-28 NOTE — Telephone Encounter (Signed)
No answer on patient's listed #, goes to VM. Left msg to call office back.

## 2017-03-31 ENCOUNTER — Telehealth: Payer: Self-pay | Admitting: Physician Assistant

## 2017-03-31 ENCOUNTER — Telehealth: Payer: Self-pay | Admitting: *Deleted

## 2017-03-31 NOTE — Telephone Encounter (Signed)
New message ° ° ° ° °Patient returning call. Please call °

## 2017-03-31 NOTE — Telephone Encounter (Signed)
Per Dr. Aundra Dubin, clearance pending outcome of echocardiogram 10/23 (see other note).

## 2017-03-31 NOTE — Telephone Encounter (Signed)
See preop clearance note. Returned call.  Spoke to patient, aware of Dr. Claris Gladden recommendations to await outcome of echocardiogram prior to cardiac clearance. He has this scheduled for 10.23. He also has OV w Dr. Caryl Comes on 10.23.  He will keep these appts and call if new concerns.

## 2017-03-31 NOTE — Telephone Encounter (Signed)
Returned call, pt aware of Dr. Claris Gladden recommendations. Echo scheduled for 10/23 at 1pm, pt has already received instructions about this.

## 2017-03-31 NOTE — Telephone Encounter (Signed)
     [facility] Pre-operative Risk Assessment    Request for surgical clearance:  1. What type of surgery is being performed?        Left total knee arthroplasty    2. When is this surgery scheduled?       tbd   3. Are there any medications that need to be held prior to surgery and how long?        4. Practice name and name of physician performing surgery? [facility]  Dr. Dorna Leitz   5. What is your office phone and fax number?      Phone: [phone], Attn: Lacretia Nicks      Fax: [phone]  6. Anesthesia type (None, local, MAC, general) ?   Please fax any additional office notes  Dr. Caryl Comes -- Juluis Rainier -- looks like this request also went to Dr. Aundra Dubin who requests pt have echo (scheduled for 10/23) prior to approval from his standpoint.     Trinidad Curet L 03/31/2017, 9:02 AM  _________________________________________________________________   (provider comments below)

## 2017-04-04 ENCOUNTER — Other Ambulatory Visit: Payer: Self-pay

## 2017-04-04 MED ORDER — INSULIN PEN NEEDLE 32G X 4 MM MISC: 11 refills | Status: DC

## 2017-04-08 ENCOUNTER — Ambulatory Visit (HOSPITAL_COMMUNITY): Payer: BLUE CROSS/BLUE SHIELD | Attending: Cardiology

## 2017-04-08 ENCOUNTER — Ambulatory Visit (INDEPENDENT_AMBULATORY_CARE_PROVIDER_SITE_OTHER): Payer: BLUE CROSS/BLUE SHIELD | Admitting: Internal Medicine

## 2017-04-08 ENCOUNTER — Other Ambulatory Visit: Payer: Self-pay

## 2017-04-08 ENCOUNTER — Encounter: Payer: Self-pay | Admitting: Internal Medicine

## 2017-04-08 VITALS — BP 106/70 | HR 71 | Ht 71.0 in | Wt 238.4 lb

## 2017-04-08 DIAGNOSIS — N189 Chronic kidney disease, unspecified: Secondary | ICD-10-CM | POA: Insufficient documentation

## 2017-04-08 DIAGNOSIS — I420 Dilated cardiomyopathy: Secondary | ICD-10-CM | POA: Diagnosis not present

## 2017-04-08 DIAGNOSIS — I10 Essential (primary) hypertension: Secondary | ICD-10-CM

## 2017-04-08 DIAGNOSIS — G4733 Obstructive sleep apnea (adult) (pediatric): Secondary | ICD-10-CM | POA: Diagnosis not present

## 2017-04-08 DIAGNOSIS — I428 Other cardiomyopathies: Secondary | ICD-10-CM | POA: Insufficient documentation

## 2017-04-08 DIAGNOSIS — E1122 Type 2 diabetes mellitus with diabetic chronic kidney disease: Secondary | ICD-10-CM | POA: Insufficient documentation

## 2017-04-08 DIAGNOSIS — E785 Hyperlipidemia, unspecified: Secondary | ICD-10-CM | POA: Insufficient documentation

## 2017-04-08 DIAGNOSIS — I2510 Atherosclerotic heart disease of native coronary artery without angina pectoris: Secondary | ICD-10-CM | POA: Diagnosis not present

## 2017-04-08 DIAGNOSIS — I071 Rheumatic tricuspid insufficiency: Secondary | ICD-10-CM | POA: Diagnosis not present

## 2017-04-08 DIAGNOSIS — I77810 Thoracic aortic ectasia: Secondary | ICD-10-CM | POA: Insufficient documentation

## 2017-04-08 DIAGNOSIS — I130 Hypertensive heart and chronic kidney disease with heart failure and stage 1 through stage 4 chronic kidney disease, or unspecified chronic kidney disease: Secondary | ICD-10-CM | POA: Insufficient documentation

## 2017-04-08 DIAGNOSIS — Z95810 Presence of automatic (implantable) cardiac defibrillator: Secondary | ICD-10-CM | POA: Diagnosis not present

## 2017-04-08 DIAGNOSIS — I5022 Chronic systolic (congestive) heart failure: Secondary | ICD-10-CM

## 2017-04-08 NOTE — Patient Instructions (Addendum)
Medication Instructions: Your physician recommends that you continue on your current medications as directed. Please refer to the Current Medication list given to you today.  Labwork: None Ordered  Procedures/Testing: None Ordered  Follow-Up: If you decide to have your Defibrillator Battery changed out please call our office.   If you need a refill on your cardiac medications before your next appointment, please call your pharmacy.

## 2017-04-08 NOTE — Progress Notes (Signed)
Patient Care Team: Glenford Bayley, DO as PCP - General (Family Medicine)   HPI  Paul [name] is a 71 y.o. male Seen in followup for ICD implanted November 2001. He had a history of presyncope and left ventricular dysfunction Ejection fraction was noted to be about 20-30%. 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 6/14 He underwent ablation and mapping Dr Greggory Brandy which was unfortunately unsuccessful, with thoughts perhaps related to epicardial focus  Since last visit, he was seen by [facility] again for consideration of redo VT/PVC  ablation. He had VT mapping in 4/16, mapped to inferior septum. He had LHC the next day, showing 90% distal LAD and 90% PDA. He had DES to both lesions. He was sent home on ASA 325, Plavix 75, and Xarelto 20. Plan was to return 1 year after DES placement to have epicardial VT ablation   Ventricular ectopy continues also to be an issue he will be following up with [facility] in March. Anote from 10/17 describes a failed epicardial /coronary sinus endocardial mapping with failure to suppress PVCs. There was a discussion of sotalol but there is concern regarding aggravation of underlying LV dysfunction. He was treated with beta blockers and Entresto.  He is not sure taht he wants to do anything about device generator replacement. He recalls that was implanted very quickly because of VT on a treadmill. He's had no interval tachyarrhythmias.  Is scheduled for knee replacement surgery in about 2 and half weeks      Past Medical History:  Diagnosis Date  . Arthritis    "thumbs, neck, knees" (06/01/2013)  . Cardiomyopathy, dilated, nonischemic ([place]) 10/20/2012  . Cataract   . CHF (congestive heart failure) ([place])    "mild" (06/01/2013)  . Erectile dysfunction   . GERD  (gastroesophageal reflux disease)   . Hypertension   . Hypogonadism male   . Hypovitaminosis D   . 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 Type II diabetes mellitus ([place])   . Ventricular tachycardia ([place])    observed during stress testing, subsequent VT on EPS    Past Surgical History:  Procedure Laterality Date  . ABLATION  06-01-2013   PVC's ablated along basal inferoseptal RV by Dr Rayann Heman  . APPENDECTOMY  1969  . CARDIAC CATHETERIZATION  04/2010  . CARDIAC DEFIBRILLATOR PLACEMENT  05/09/2010   MDT ICD implanted in [place] by Dr Ky Barban  . CATARACT EXTRACTION W/ INTRAOCULAR LENS  IMPLANT, BILATERAL Bilateral   . KNEE ARTHROSCOPY Left 1984  . LUMBAR LAMINECTOMY  ~ 2004  . TONSILLECTOMY AND ADENOIDECTOMY  1972  . V-TACH ABLATION N/A 06/01/2013   Procedure: V-TACH ABLATION;  Surgeon: Coralyn Mark, MD;  Location: [facility];  Service: Cardiovascular;  Laterality: N/A;  . VASECTOMY    . VENTRICULAR ABLATION SURGERY  06/01/2013    Current Outpatient Prescriptions  Medication Sig Dispense Refill  . aspirin EC 81 MG tablet Take 81 mg by mouth daily.    Marland Kitchen atorvastatin (LIPITOR) 40 MG tablet Take 1 tablet (40 mg total) by mouth daily. 90 tablet 3  . Blood Glucose Monitoring Suppl (BLOOD GLUCOSE MONITOR SYSTEM) w/Device KIT Glucose monitor to check blood sugars tid dx code E11.9 brand per insurance coverage 1 each 0  . canagliflozin (INVOKANA) 100  MG TABS tablet Take 1 tablet (100 mg total) by mouth daily before breakfast. 30 tablet 5  . carvedilol (COREG) 6.25 MG tablet Take 1 tablet (6.25 mg total) by mouth 2 (two) times daily. 180 tablet 3  . Cholecalciferol ([facility] VIT D 5000 HIGH-POTENCY PO) Take 10,000 Units by mouth daily.    . furosemide (LASIX) 40 MG tablet Take 40 mg by mouth daily.    Marland Kitchen glimepiride (AMARYL) 4 MG tablet Take 1 twice a day 180 tablet 3  . glucose blood (BAYER CONTOUR NEXT TEST) test strip USE TO CHECK BLOOD  SUGARS THREE TIMES DAILY 100 each 12  . Insulin Glargine (LANTUS SOLOSTAR) 100 UNIT/ML Solostar Pen Inject 38 Units into the skin at bedtime. 5 pen 11  . Insulin Pen Needle (CLICKFINE PEN NEEDLES) 32G X 4 MM MISC Use 2x a day - with Lantus and Victoza. 200 each 11  . Lancets MISC Glucose monitor to check blood sugars tid dx code E11.9 brand per insurance coverage 100 each 11  . metFORMIN (GLUCOPHAGE-XR) 500 MG 24 hr tablet Take 4 tablets (2,000 mg total) by mouth daily with breakfast. 360 tablet 3  . sacubitril-valsartan (ENTRESTO) 49-51 MG Take 1 tablet by mouth 2 (two) times daily. 180 tablet 3  . spironolactone (ALDACTONE) 25 MG tablet Take 0.5 tablets (12.5 mg total) by mouth daily. 16 tablet 3  . testosterone (ANDROGEL) 50 MG/5GM (1%) GEL Place 5 g onto the skin daily. 150 g 5  . VICTOZA 18 MG/3ML SOPN INJECT 1.8 MG INTO THE SKIN DAILY. 2 pen 0   No current facility-administered medications for this visit.     Allergies  Allergen Reactions  . Lisinopril     angioedema    Review of Systems negative except from HPI and PMH  Physical Exam BP 106/70   Pulse 71   Ht _0  (1.803 m)   Wt 238 lb 6.4 oz (108.1 kg)   BMI 33.25 kg/m  Well developed and well nourished in no acute distress HENT normal E scleral and icterus clear Neck Supple JVP flat; carotids brisk and full Clear to ausculation Irregular rate and rhythm no murmurs gallops or rub Device pocket well healed; without hematoma or erythema.  There is no tethering Soft with active bowel sounds No clubbing cyanosis tr Edema Alert and oriented, grossly normal motor and sensory function Skin Warm and Dry  Sinus with intermittent competitive ventricular pacing and frequent PVCs  Assessment and  Plan  PVCs/Ventricular tach-nonsustained   Cardiomyopathy-NICM  Coronary artery disease status post stenting [facility] 2016   CHF chronic Systolic improved  ICD-Medtronic  The patient's device was interrogated.  The information  was reviewed. No changes were made in the programming.    Diminished R-wave  SCAF   The patient has broached the subject as to whether he needs the defibrillator replaced at all. He is aware of the risk of infection. He is aware that he has never gone off. We reviewed the initial data. At this point he would like not to proceed with device generator replacement  He also mentioned that Dr. DM had broached the issue of SCAF; we reviewed recent data including that from the ASSERT   trial Showing an increase in stroke risk in 24 hours of A. fib but not less. I also reviewed with him Naida Sleight recommendations about CHADS-VASc score 3 to begin to consider anticoagulation probably in 12 hours.  Is device was reprogrammed to decrease competitive pacing and decreased output point  inappropriate pacing as well as to inactivate E O L alert  More than 50% of 40 min was spent in counseling related to the above  We will see him as needed

## 2017-04-09 NOTE — Telephone Encounter (Signed)
Notes recorded by Scarlette Calico, RN on 04/09/2017 at 12:06 PM EDT Pt aware, he states he needs clearance for his knee surgery, he states clearance was pending on the these outcomes, will send to Dr Aundra Dubin for review.

## 2017-04-09 NOTE — Telephone Encounter (Signed)
-----   Message from Larey Dresser, MD sent at 04/09/2017  8:40 AM EDT ----- EF mildly improved to 40-45%.

## 2017-04-09 NOTE — Telephone Encounter (Signed)
No contraindication to knee surgery.

## 2017-04-11 NOTE — Telephone Encounter (Signed)
See Dr. Claris Gladden note, patient cleared for surgery. Ideally continue Aspirin through the surgery.

## 2017-04-11 NOTE — Telephone Encounter (Signed)
Faxed to [facility] attention Lacretia Nicks via [employer].

## 2017-04-14 ENCOUNTER — Other Ambulatory Visit: Payer: Self-pay | Admitting: Orthopedic Surgery

## 2017-04-17 ENCOUNTER — Ambulatory Visit (INDEPENDENT_AMBULATORY_CARE_PROVIDER_SITE_OTHER): Payer: BLUE CROSS/BLUE SHIELD | Admitting: Ophthalmology

## 2017-04-18 SURGERY — ARTHROPLASTY, KNEE, TOTAL
Anesthesia: Spinal | Laterality: Left

## 2017-04-18 MED FILL — SPIRONOLACTONE 25 MG TABLET: 25 | 30 days supply | Qty: 15 | Fill #1

## 2017-04-18 MED FILL — INVOKANA 100 MG TABLET: 100 | 30 days supply | Qty: 30 | Fill #1

## 2017-04-23 ENCOUNTER — Other Ambulatory Visit: Payer: Self-pay | Admitting: Orthopedic Surgery

## 2017-04-24 NOTE — Pre-Procedure Instructions (Signed)
Paul [name]  04/24/2017      [place], [place] - [address] [place] [address] Phone: [phone] Fax: [phone]  [id number] [address], [place] [address] Phone: [phone] Fax: [phone]    Your procedure is scheduled on Monday November 12.  Report to [facility] Admitting at 11:30 A.M.  Call this number if you have problems the morning of surgery:  [phone]   Remember:  Do not eat food or drink liquids after midnight.  Take these medicines the morning of surgery with A SIP OF WATER: Carvedilol (coreg)  7 days prior to surgery STOP taking any Aspirin (unless otherwise instructed by your surgeon), Aleve, Naproxen, Ibuprofen, Motrin, Advil, Goody's, BC's, all herbal medications, fish oil, and all vitamins  WHAT DO I DO ABOUT MY DIABETES MEDICATION?   Marland Kitchen Do not take oral diabetes medicines (pills) the morning of surgery. DO NOT TAKE glimepiride (amaryl), canagliflozin (invokana), or metformin (glucophage) the day of surgery.   . THE NIGHT BEFORE SURGERY, take 19 units of insulin glargine (lantus) insulin.      . The day of surgery, do not take other diabetes injectables, including Victoza (liraglutide)  . If your CBG is greater than 220 mg/dL, you may take  of your sliding scale (correction) dose of insulin.   How to Manage Your Diabetes Before and After Surgery  Why is it important to control my blood sugar before and after surgery? . Improving blood sugar levels before and after surgery helps healing and can limit problems. . A way of improving blood sugar control is eating a healthy diet by: o  Eating less sugar and carbohydrates o  Increasing activity/exercise o  Talking with your doctor about reaching your blood sugar goals . High blood sugars (greater than 180 mg/dL) can raise your risk of infections and slow your recovery,  so you will need to focus on controlling your diabetes during the weeks before surgery. . Make sure that the doctor who takes care of your diabetes knows about your planned surgery including the date and location.  How do I manage my blood sugar before surgery? . Check your blood sugar at least 4 times a day, starting 2 days before surgery, to make sure that the level is not too high or low. o Check your blood sugar the morning of your surgery when you wake up and every 2 hours until you get to the Short Stay unit. . If your blood sugar is less than 70 mg/dL, you will need to treat for low blood sugar: o Do not take insulin. o Treat a low blood sugar (less than 70 mg/dL) with  cup of clear juice (cranberry or apple), 4 glucose tablets, OR glucose gel. Recheck blood sugar in 15 minutes after treatment (to make sure it is greater than 70 mg/dL). If your blood sugar is not greater than 70 mg/dL on recheck, call [phone] o  for further instructions. . Report your blood sugar to the short stay nurse when you get to Short Stay.  . If you are admitted to the hospital after surgery: o Your blood sugar will be checked by the staff and you will probably be given insulin after surgery (instead of oral diabetes medicines) to make sure you have good blood sugar levels. o The goal for blood sugar control after surgery is 80-180 mg/dL.  Do not wear jewelry, make-up or nail polish.  Do not wear lotions, powders, or perfumes, or deoderant.  Do not shave 48 hours prior to surgery.  Men may shave face and neck.  Do not bring valuables to the hospital.  [facility] is not responsible for any belongings or valuables.  Contacts, dentures or bridgework may not be worn into surgery.  Leave your suitcase in the car.  After surgery it may be brought to your room.  For patients admitted to the hospital, discharge time will be determined by your treatment team.  Patients discharged the  day of surgery will not be allowed to drive home.   Special instructions:    [facility]- Preparing For Surgery  Before surgery, you can play an important role. Because skin is not sterile, your skin needs to be as free of germs as possible. You can reduce the number of germs on your skin by washing with CHG (chlorahexidine gluconate) Soap before surgery.  CHG is an antiseptic cleaner which kills germs and bonds with the skin to continue killing germs even after washing.  Please do not use if you have an allergy to CHG or antibacterial soaps. If your skin becomes reddened/irritated stop using the CHG.  Do not shave (including legs and underarms) for at least 48 hours prior to first CHG shower. It is OK to shave your face.  Please follow these instructions carefully.   1. Shower the NIGHT BEFORE SURGERY and the MORNING OF SURGERY with CHG.   2. If you chose to wash your hair, wash your hair first as usual with your normal shampoo.  3. After you shampoo, rinse your hair and body thoroughly to remove the shampoo.  4. Use CHG as you would any other liquid soap. You can apply CHG directly to the skin and wash gently with a scrungie or a clean washcloth.   5. Apply the CHG Soap to your body ONLY FROM THE NECK DOWN.  Do not use on open wounds or open sores. Avoid contact with your eyes, ears, mouth and genitals (private parts). Wash Face and genitals (private parts)  with your normal soap.  6. Wash thoroughly, paying special attention to the area where your surgery will be performed.  7. Thoroughly rinse your body with warm water from the neck down.  8. DO NOT shower/wash with your normal soap after using and rinsing off the CHG Soap.  9. Pat yourself dry with a CLEAN TOWEL.  10. Wear CLEAN PAJAMAS to bed the night before surgery, wear comfortable clothes the morning of surgery  11. Place CLEAN SHEETS on your bed the night of your first shower and DO NOT SLEEP WITH PETS.    Day of  Surgery: Do not apply any deodorants/lotions. Please wear clean clothes to the hospital/surgery center.      Please read over the following fact sheets that you were given. Coughing and Deep Breathing, Total Joint Packet, MRSA Information and Surgical Site Infection Prevention

## 2017-04-25 ENCOUNTER — Other Ambulatory Visit: Payer: Self-pay

## 2017-04-25 ENCOUNTER — Encounter (HOSPITAL_COMMUNITY)
Admission: RE | Admit: 2017-04-25 | Discharge: 2017-04-25 | Disposition: A | Discharge status: Home / Self Care | Payer: BLUE CROSS/BLUE SHIELD | Source: Ambulatory Visit | Attending: Orthopedic Surgery | Admitting: Orthopedic Surgery

## 2017-04-25 ENCOUNTER — Inpatient Hospital Stay: Admit: 2017-04-25 | Payer: BLUE CROSS/BLUE SHIELD | Admitting: Orthopedic Surgery

## 2017-04-25 ENCOUNTER — Encounter (HOSPITAL_COMMUNITY): Payer: Self-pay

## 2017-04-25 HISTORY — DX: Cardiac arrhythmia, unspecified: I49.9

## 2017-04-25 HISTORY — DX: Presence of cardiac pacemaker: Z95.0

## 2017-04-25 HISTORY — DX: Sleep apnea, unspecified: G47.30

## 2017-04-25 HISTORY — DX: Presence of automatic (implantable) cardiac defibrillator: Z95.810

## 2017-04-25 LAB — CBC
HEMATOCRIT: 40.5 % (ref 39.0–52.0)
HEMOGLOBIN: 13.5 g/dL (ref 13.0–17.0)
MCH: 30.4 pg (ref 26.0–34.0)
MCHC: 33.3 g/dL (ref 30.0–36.0)
MCV: 91.2 fL (ref 78.0–100.0)
Platelets: 196 10*3/uL (ref 150–400)
RBC: 4.44 MIL/uL (ref 4.22–5.81)
RDW: 14.3 % (ref 11.5–15.5)
WBC: 6.5 10*3/uL (ref 4.0–10.5)

## 2017-04-25 LAB — HEMOGLOBIN A1C
Hgb A1c MFr Bld: 8 % — ABNORMAL HIGH (ref 4.8–5.6)
MEAN PLASMA GLUCOSE: 182.9 mg/dL

## 2017-04-25 LAB — GLUCOSE, CAPILLARY: GLUCOSE-CAPILLARY: 280 mg/dL — AB (ref 65–99)

## 2017-04-25 LAB — BASIC METABOLIC PANEL
Anion gap: 10 (ref 5–15)
BUN: 35 mg/dL — AB (ref 6–20)
CHLORIDE: 101 mmol/L (ref 101–111)
CO2: 24 mmol/L (ref 22–32)
CREATININE: 1.55 mg/dL — AB (ref 0.61–1.24)
Calcium: 8.9 mg/dL (ref 8.9–10.3)
GFR calc Af Amer: 51 mL/min — ABNORMAL LOW (ref >60)
GFR calc non Af Amer: 44 mL/min — ABNORMAL LOW (ref >60)
GLUCOSE: 277 mg/dL — AB (ref 65–99)
Potassium: 4.1 mmol/L (ref 3.5–5.1)
Sodium: 135 mmol/L (ref 135–145)

## 2017-04-25 LAB — SURGICAL PCR SCREEN
MRSA, PCR: NEGATIVE
Staphylococcus aureus: NEGATIVE

## 2017-04-25 MED FILL — VICTOZA 18 MG/3 ML INJECT P: 18 | 30 days supply | Qty: 9 | Fill #7

## 2017-04-25 MED FILL — FUROSEMIDE 40 MG TABS: 40 | 30 days supply | Qty: 30 | Fill #3

## 2017-04-25 MED FILL — LANTUS SOLOSTAR 100 UNITS/M: 100 | 30 days supply | Qty: 12 | Fill #5

## 2017-04-25 MED FILL — CARVEDILOL 6.25 MG TABLET: 6.25 | 30 days supply | Qty: 60 | Fill #2

## 2017-04-25 MED FILL — METFORMIN HCL ER 500 MG TAB: 500 | 30 days supply | Qty: 120 | Fill #2

## 2017-04-25 MED FILL — ATORVASTATIN 40 MG TABLET: 40 | 30 days supply | Qty: 30 | Fill #2

## 2017-04-25 MED FILL — ENTRESTO 49 MG-51 MG TABLET: 49-51 | 30 days supply | Qty: 60 | Fill #2

## 2017-04-25 MED FILL — TAMSULOSIN HCL 0.4 MG CAP: 0.4 | 30 days supply | Qty: 30 | Fill #0

## 2017-04-25 MED FILL — UNIFINE PENTIPS 32GX5/32": 32G X 4 MM | 30 days supply | Qty: 100 | Fill #0

## 2017-04-25 MED FILL — GLIMEPIRIDE 4 MG TABLET: 4 | 30 days supply | Qty: 60 | Fill #2

## 2017-04-25 MED FILL — UNIFINE PENTIPS 32GX5/32: 32G X 4 MM | 30 days supply | Qty: 100 | Fill #0

## 2017-04-25 MED FILL — NABUMETONE 500 MG TABLET: 500 | 30 days supply | Qty: 60 | Fill #2

## 2017-04-25 NOTE — Pre-Procedure Instructions (Signed)
Paul [name]  04/25/2017      [place], [place] - [address] [place] [address] Phone: [phone] Fax: [phone]  [id number] [address], [place] [address] Phone: [phone] Fax: [phone]    Your procedure is scheduled on Monday November 12.  Report to [facility] Admitting at 11:30 A.M.  Call this number if you have problems the morning of surgery:  [phone]   Remember:  Do not eat food or drink liquids after midnight.  Take these medicines the morning of surgery with A SIP OF WATER: Carvedilol (coreg)  Starting today 04/25/17 STOP taking any  otherwise  Aleve, Naproxen, Ibuprofen, Motrin, Advil, Goody's, BC's, all herbal medications, fish oil, and all vitamins  Stop aspirin as instructed by dr  WHAT DO I DO ABOUT MY DIABETES MEDICATION?  Marland Kitchen Do not take oral diabetes medicines (pills) the morning of surgery. DO NOT TAKE glimepiride (amaryl), canagliflozin (invokana), or metformin (glucophage) the day of surgery.   . THE NIGHT BEFORE SURGERY, take 19 units of insulin glargine (lantus) insulin.      . The day of surgery, do not take other diabetes injectables, including Victoza (liraglutide)(may take day before)   How to Manage Your Diabetes Before and After Surgery  Why is it important to control my blood sugar before and after surgery? . Improving blood sugar levels before and after surgery helps healing and can limit problems. . A way of improving blood sugar control is eating a healthy diet by: o  Eating less sugar and carbohydrates o  Increasing activity/exercise o  Talking with your doctor about reaching your blood sugar goals . High blood sugars (greater than 180 mg/dL) can raise your risk of infections and slow your recovery, so you will need to focus on controlling your diabetes during the weeks before surgery. . Make sure  that the doctor who takes care of your diabetes knows about your planned surgery including the date and location.  How do I manage my blood sugar before surgery? . Check your blood sugar at least 4 times a day, starting 2 days before surgery, to make sure that the level is not too high or low. o Check your blood sugar the morning of your surgery when you wake up and every 2 hours until you get to the Short Stay unit. . If your blood sugar is less than 70 mg/dL, you will need to treat for low blood sugar: o Do not take insulin. o Treat a low blood sugar (less than 70 mg/dL) with  cup of clear juice (cranberry or apple), 4 glucose tablets, OR glucose gel. Recheck blood sugar in 15 minutes after treatment (to make sure it is greater than 70 mg/dL). If your blood sugar is not greater than 70 mg/dL on recheck, call [phone] o  for further instructions. . Report your blood sugar to the short stay nurse when you get to Short Stay.  . If you are admitted to the hospital after surgery: o Your blood sugar will be checked by the staff and you will probably be given insulin after surgery (instead of oral diabetes medicines) to make sure you have good blood sugar levels. o The goal for blood sugar control after surgery is 80-180 mg/dL  Do not wear jewelry, make-up or nail polish.  Do not wear lotions, powders, or perfumes, or deoderant.  Do not shave 48 hours prior to surgery.  Men may shave face and neck.  Do not bring valuables to the hospital.  [facility] is not responsible for any belongings or valuables.  Contacts, dentures or bridgework may not be worn into surgery.  Leave your suitcase in the car.  After surgery it may be brought to your room.  For patients admitted to the hospital, discharge time will be determined by your treatment team.  Patients discharged the day of surgery will not be allowed to drive home.   Special instructions:    [facility]- Preparing For Surgery  Before  surgery, you can play an important role. Because skin is not sterile, your skin needs to be as free of germs as possible. You can reduce the number of germs on your skin by washing with CHG (chlorahexidine gluconate) Soap before surgery.  CHG is an antiseptic cleaner which kills germs and bonds with the skin to continue killing germs even after washing.  Please do not use if you have an allergy to CHG or antibacterial soaps. If your skin becomes reddened/irritated stop using the CHG.  Do not shave (including legs and underarms) for at least 48 hours prior to first CHG shower. It is OK to shave your face.  Please follow these instructions carefully.   1. Shower the NIGHT BEFORE SURGERY and the MORNING OF SURGERY with CHG.   2. If you chose to wash your hair, wash your hair first as usual with your normal shampoo.  3. After you shampoo, rinse your hair and body thoroughly to remove the shampoo.  4. Use CHG as you would any other liquid soap. You can apply CHG directly to the skin and wash gently with a scrungie or a clean washcloth.   5. Apply the CHG Soap to your body ONLY FROM THE NECK DOWN.  Do not use on open wounds or open sores. Avoid contact with your eyes, ears, mouth and genitals (private parts). Wash Face and genitals (private parts)  with your normal soap.  6. Wash thoroughly, paying special attention to the area where your surgery will be performed.  7. Thoroughly rinse your body with warm water from the neck down.  8. DO NOT shower/wash with your normal soap after using and rinsing off the CHG Soap.  9. Pat yourself dry with a CLEAN TOWEL.  10. Wear CLEAN PAJAMAS to bed the night before surgery, wear comfortable clothes the morning of surgery  11. Place CLEAN SHEETS on your bed the night of your first shower and DO NOT SLEEP WITH PETS.    Day of Surgery: Do not apply any deodorants/lotions. Please wear clean clothes to the hospital/surgery center.      Please read over  the  fact sheets that you were given.

## 2017-04-25 NOTE — Progress Notes (Addendum)
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   Pt is a 71 year old male scheduled for L total knee arthroplasty on 04/28/2017 with Dorna Leitz, MD  - PCP is Rikki Spearing, DO - Endocrinologist is Philemon Kingdom, MD - Cardiologist is Loralie Champagne, MD, last office visit 03/17/17. Dr. Aundra Dubin cleared pt for surgery - EP cardiologist is Virl Axe, MD, last office visit 04/08/17. Pt has elected to not replace ICD generator as it nears end of life. F/u prn recommended. Dr. Caryl Comes is aware of upcoming surgery  PMH includes: L total knee arthroplasty on 04/28/2017 with Dorna Leitz, MD  PMH includes:  CAD, CHF, nonischemic cardiomyopathy, AICD (Medtronic, implanted 05/09/10), HTN, DM, nonsustained ventricular tachycardia, OSA, GERD. Never smoker. BMI 33  Medications include: ASA 81 mg, Lipitor, invokana, carvedilol, Lasix, glimepiride, Lantus, metformin, entresto, spironolactone, Victoza  BP 127/77   Pulse 80   Temp 36.6 C   Resp 20   Ht 6' (1.829 m)   Wt 241 lb 6.4 oz (109.5 kg)   SpO2 100%   BMI 32.74 kg/m   Preoperative labs reviewed.   - HbA1c 8.0, glucose 277  EKG 04/08/17: Sinus rhythm with intermittent competitive ventricular pacing and frequent PVCs  Echo 04/08/17:  - Left ventricle: The cavity size was normal. There was mild concentric hypertrophy. Systolic function was mildly to moderately reduced. The estimated ejection fraction was in the range of 40% to 45%. Wall motion was normal; there were no regional wall motion abnormalities. Doppler parameters are consistent with abnormal left ventricular relaxation (grade 1 diastolic dysfunction). There was no evidence of elevated ventricular filling pressure by Doppler parameters. - Aortic root: The aortic root was mildly dilated measuring 42 mm. - Ascending aorta: The ascending aorta was normal in size. - Mitral valve: There was trivial regurgitation. - Left atrium: The atrium was mildly dilated. - Right ventricle: Systolic  function was normal. - Right atrium: Pacer wire or catheter noted in right atrium. - Tricuspid valve: There was mild regurgitation. - Inferior vena cava: The vessel was normal in size. - Impressions: When compared to th eprior study from 06/22/2014 LVEF has improved from 30-35% to 40-45%.  Cardiac cath 09/22/14 ([facility]):  - By notes, LHC showed 90% distal LAD and 90% PDA. He had DES to both lesions.  Perioperative prescription for AICD pending.   I notified Elmyra Ricks in Dr. Berenice Primas' office of pt's uncontrolled DM.   If glucose acceptable day of surgery, I anticipate pt can proceed with surgery as scheduled.   Willeen Cass, FNP-BC [facility] Short Stay Surgical Center/Anesthesiology Phone: [phone] 04/25/2017 1:51 PM

## 2017-04-27 MED ORDER — TRANEXAMIC ACID 1000 MG/10ML IV SOLN
2000.0000 mg | INTRAVENOUS | Status: DC
  Filled 2017-04-27: qty 20

## 2017-04-28 ENCOUNTER — Inpatient Hospital Stay (HOSPITAL_COMMUNITY): Payer: BLUE CROSS/BLUE SHIELD | Admitting: Emergency Medicine

## 2017-04-28 ENCOUNTER — Inpatient Hospital Stay (HOSPITAL_COMMUNITY)
Admission: RE | Admit: 2017-04-28 | Discharge: 2017-04-30 | DRG: 470 | Disposition: A | Discharge status: Home Health | Payer: BLUE CROSS/BLUE SHIELD | Source: Ambulatory Visit | Attending: Orthopedic Surgery | Admitting: Orthopedic Surgery

## 2017-04-28 ENCOUNTER — Inpatient Hospital Stay (HOSPITAL_COMMUNITY): Payer: BLUE CROSS/BLUE SHIELD | Admitting: Anesthesiology

## 2017-04-28 ENCOUNTER — Encounter (HOSPITAL_COMMUNITY): Payer: Self-pay | Admitting: Urology

## 2017-04-28 ENCOUNTER — Inpatient Hospital Stay (HOSPITAL_COMMUNITY): Payer: BLUE CROSS/BLUE SHIELD

## 2017-04-28 ENCOUNTER — Encounter (HOSPITAL_COMMUNITY)
Admission: RE | Disposition: A | Discharge status: Home Health | Payer: Self-pay | Source: Ambulatory Visit | Attending: Orthopedic Surgery

## 2017-04-28 DIAGNOSIS — E1165 Type 2 diabetes mellitus with hyperglycemia: Secondary | ICD-10-CM | POA: Diagnosis present

## 2017-04-28 DIAGNOSIS — E1140 Type 2 diabetes mellitus with diabetic neuropathy, unspecified: Secondary | ICD-10-CM | POA: Diagnosis not present

## 2017-04-28 DIAGNOSIS — I5022 Chronic systolic (congestive) heart failure: Secondary | ICD-10-CM | POA: Diagnosis present

## 2017-04-28 DIAGNOSIS — Z95810 Presence of automatic (implantable) cardiac defibrillator: Secondary | ICD-10-CM | POA: Diagnosis not present

## 2017-04-28 DIAGNOSIS — Z8249 Family history of ischemic heart disease and other diseases of the circulatory system: Secondary | ICD-10-CM

## 2017-04-28 DIAGNOSIS — Z888 Allergy status to other drugs, medicaments and biological substances status: Secondary | ICD-10-CM

## 2017-04-28 DIAGNOSIS — Z01812 Encounter for preprocedural laboratory examination: Secondary | ICD-10-CM | POA: Diagnosis not present

## 2017-04-28 DIAGNOSIS — G4730 Sleep apnea, unspecified: Secondary | ICD-10-CM | POA: Diagnosis present

## 2017-04-28 DIAGNOSIS — M25562 Pain in left knee: Secondary | ICD-10-CM | POA: Diagnosis not present

## 2017-04-28 DIAGNOSIS — M170 Bilateral primary osteoarthritis of knee: Secondary | ICD-10-CM | POA: Diagnosis not present

## 2017-04-28 DIAGNOSIS — E669 Obesity, unspecified: Secondary | ICD-10-CM | POA: Diagnosis present

## 2017-04-28 DIAGNOSIS — I420 Dilated cardiomyopathy: Secondary | ICD-10-CM | POA: Diagnosis present

## 2017-04-28 DIAGNOSIS — R918 Other nonspecific abnormal finding of lung field: Secondary | ICD-10-CM | POA: Diagnosis not present

## 2017-04-28 DIAGNOSIS — E1142 Type 2 diabetes mellitus with diabetic polyneuropathy: Secondary | ICD-10-CM | POA: Diagnosis present

## 2017-04-28 DIAGNOSIS — Z794 Long term (current) use of insulin: Secondary | ICD-10-CM | POA: Diagnosis not present

## 2017-04-28 DIAGNOSIS — G8918 Other acute postprocedural pain: Secondary | ICD-10-CM | POA: Diagnosis not present

## 2017-04-28 DIAGNOSIS — I110 Hypertensive heart disease with heart failure: Secondary | ICD-10-CM | POA: Diagnosis present

## 2017-04-28 DIAGNOSIS — M1712 Unilateral primary osteoarthritis, left knee: Secondary | ICD-10-CM | POA: Diagnosis not present

## 2017-04-28 DIAGNOSIS — Z01818 Encounter for other preprocedural examination: Secondary | ICD-10-CM

## 2017-04-28 DIAGNOSIS — Z6833 Body mass index (BMI) 33.0-33.9, adult: Secondary | ICD-10-CM

## 2017-04-28 HISTORY — PX: TOTAL KNEE ARTHROPLASTY: SHX125

## 2017-04-28 LAB — URINALYSIS, ROUTINE W REFLEX MICROSCOPIC
BACTERIA UA: NONE SEEN
BILIRUBIN URINE: NEGATIVE
Glucose, UA: >=500 mg/dL — AB
Hgb urine dipstick: NEGATIVE
Ketones, ur: 5 mg/dL — AB
Leukocytes, UA: NEGATIVE
NITRITE: NEGATIVE
PH: 5 (ref 5.0–8.0)
Protein, ur: NEGATIVE mg/dL
SPECIFIC GRAVITY, URINE: 1.021 (ref 1.005–1.030)
Squamous Epithelial / LPF: NONE SEEN

## 2017-04-28 LAB — GLUCOSE, CAPILLARY
GLUCOSE-CAPILLARY: 74 mg/dL (ref 65–99)
Glucose-Capillary: 126 mg/dL — ABNORMAL HIGH (ref 65–99)
Glucose-Capillary: 98 mg/dL (ref 65–99)
Glucose-Capillary: 238 mg/dL — ABNORMAL HIGH (ref 65–99)

## 2017-04-28 LAB — HEPATIC FUNCTION PANEL
ALT: 25 U/L (ref 17–63)
AST: 26 U/L (ref 15–41)
Albumin: 3.5 g/dL (ref 3.5–5.0)
Alkaline Phosphatase: 74 U/L (ref 38–126)
BILIRUBIN INDIRECT: 0.6 mg/dL (ref 0.3–0.9)
Bilirubin, Direct: 0.2 mg/dL (ref 0.1–0.5)
TOTAL PROTEIN: 6.4 g/dL — AB (ref 6.5–8.1)
Total Bilirubin: 0.8 mg/dL (ref 0.3–1.2)

## 2017-04-28 LAB — DIFFERENTIAL
BASOS ABS: 0.1 10*3/uL (ref 0.0–0.1)
BASOS PCT: 1 %
Eosinophils Absolute: 0.3 10*3/uL (ref 0.0–0.7)
Eosinophils Relative: 4 %
Lymphocytes Relative: 26 %
Lymphs Abs: 1.9 10*3/uL (ref 0.7–4.0)
Monocytes Absolute: 0.4 10*3/uL (ref 0.1–1.0)
Monocytes Relative: 5 %
NEUTROS ABS: 4.8 10*3/uL (ref 1.7–7.7)
NEUTROS PCT: 64 %

## 2017-04-28 LAB — TYPE AND SCREEN
ABO/RH(D): A POS
Antibody Screen: NEGATIVE

## 2017-04-28 LAB — PROTIME-INR
INR: 1.07
PROTHROMBIN TIME: 13.8 s (ref 11.4–15.2)

## 2017-04-28 LAB — ABO/RH: ABO/RH(D): A POS

## 2017-04-28 SURGERY — ARTHROPLASTY, KNEE, TOTAL
Anesthesia: Spinal | Laterality: Left

## 2017-04-28 MED ORDER — FENTANYL CITRATE (PF) 250 MCG/5ML IJ SOLN
INTRAMUSCULAR | Status: AC
  Filled 2017-04-28: qty 5

## 2017-04-28 MED ORDER — ROPIVACAINE HCL 7.5 MG/ML IJ SOLN
INTRAMUSCULAR | Status: DC | PRN
  Administered 2017-04-28: 20 mL via PERINEURAL

## 2017-04-28 MED ORDER — OXYCODONE-ACETAMINOPHEN 5-325 MG PO TABS: 1.0000 | ORAL_TABLET | Freq: Four times a day (QID) | ORAL | 0 refills | Status: DC | PRN

## 2017-04-28 MED ORDER — ASPIRIN EC 325 MG PO TBEC
325.0000 mg | DELAYED_RELEASE_TABLET | Freq: Two times a day (BID) | ORAL | Status: DC
  Administered 2017-04-28 – 2017-04-30 (×4): 325 mg via ORAL
  Filled 2017-04-28 (×3): qty 1

## 2017-04-28 MED ORDER — DIPHENHYDRAMINE HCL 12.5 MG/5ML PO ELIX
12.5000 mg | ORAL_SOLUTION | ORAL | Status: DC | PRN
Start: 2017-04-28 — End: 2017-04-30

## 2017-04-28 MED ORDER — FENTANYL CITRATE (PF) 250 MCG/5ML IJ SOLN
INTRAMUSCULAR | Status: DC | PRN
  Administered 2017-04-28: 50 ug via INTRAVENOUS
  Administered 2017-04-28: 25 ug via INTRAVENOUS

## 2017-04-28 MED ORDER — PROPOFOL 10 MG/ML IV BOLUS
INTRAVENOUS | Status: DC | PRN
  Administered 2017-04-28 (×2): 20 mg via INTRAVENOUS

## 2017-04-28 MED ORDER — CEFAZOLIN SODIUM-DEXTROSE 2-4 GM/100ML-% IV SOLN
2.0000 g | INTRAVENOUS | Status: AC
  Administered 2017-04-28: 2 g via INTRAVENOUS
  Filled 2017-04-28: qty 100

## 2017-04-28 MED ORDER — METHOCARBAMOL 1000 MG/10ML IJ SOLN: 500.0000 mg | Freq: Four times a day (QID) | INTRAVENOUS | Status: DC | PRN

## 2017-04-28 MED ORDER — GLIMEPIRIDE 4 MG PO TABS
4.0000 mg | ORAL_TABLET | Freq: Two times a day (BID) | ORAL | Status: DC
  Administered 2017-04-28 – 2017-04-30 (×4): 4 mg via ORAL
  Filled 2017-04-28 (×3): qty 1

## 2017-04-28 MED ORDER — BUPIVACAINE LIPOSOME 1.3 % IJ SUSP
20.0000 mL | Freq: Once | INTRAMUSCULAR | Status: DC
  Filled 2017-04-28: qty 20

## 2017-04-28 MED ORDER — TRANEXAMIC ACID 1000 MG/10ML IV SOLN
1000.0000 mg | INTRAVENOUS | Status: AC
  Administered 2017-04-28: 1000 mg via INTRAVENOUS
  Filled 2017-04-28: qty 1100

## 2017-04-28 MED ORDER — SODIUM CHLORIDE 0.9 % IV SOLN
INTRAVENOUS | Status: DC
  Administered 2017-04-28: 100 mL/h via INTRAVENOUS
  Administered 2017-04-29: 06:00:00 via INTRAVENOUS

## 2017-04-28 MED ORDER — SPIRONOLACTONE 25 MG PO TABS
12.5000 mg | ORAL_TABLET | Freq: Every day | ORAL | Status: DC
  Administered 2017-04-29 – 2017-04-30 (×2): 12.5 mg via ORAL
  Filled 2017-04-28 (×2): qty 1

## 2017-04-28 MED ORDER — BISACODYL 5 MG PO TBEC: 5.0000 mg | DELAYED_RELEASE_TABLET | Freq: Every day | ORAL | Status: DC | PRN

## 2017-04-28 MED ORDER — ONDANSETRON HCL 4 MG/2ML IJ SOLN
INTRAMUSCULAR | Status: DC | PRN
  Administered 2017-04-28: 4 mg via INTRAVENOUS

## 2017-04-28 MED ORDER — METHOCARBAMOL 500 MG PO TABS
ORAL_TABLET | ORAL | Status: AC
  Administered 2017-04-28: 500 mg via ORAL
  Filled 2017-04-28: qty 1

## 2017-04-28 MED ORDER — 0.9 % SODIUM CHLORIDE (POUR BTL) OPTIME
TOPICAL | Status: DC | PRN
  Administered 2017-04-28: 1000 mL

## 2017-04-28 MED ORDER — SACUBITRIL-VALSARTAN 49-51 MG PO TABS
1.0000 | ORAL_TABLET | Freq: Two times a day (BID) | ORAL | Status: DC
  Administered 2017-04-28 – 2017-04-30 (×4): 1 via ORAL
  Filled 2017-04-28 (×4): qty 1

## 2017-04-28 MED ORDER — TIZANIDINE HCL 2 MG PO TABS: 2.0000 mg | ORAL_TABLET | Freq: Three times a day (TID) | ORAL | 0 refills | Status: DC | PRN

## 2017-04-28 MED ORDER — BUPIVACAINE LIPOSOME 1.3 % IJ SUSP
20.0000 mL | Freq: Once | INTRAMUSCULAR | Status: AC
  Administered 2017-04-28: 20 mL
  Filled 2017-04-28: qty 20

## 2017-04-28 MED ORDER — HYDROMORPHONE HCL 1 MG/ML IJ SOLN
0.5000 mg | INTRAMUSCULAR | Status: DC | PRN
  Administered 2017-04-28 – 2017-04-29 (×2): 1 mg via INTRAVENOUS
  Filled 2017-04-28 (×2): qty 1

## 2017-04-28 MED ORDER — DEXAMETHASONE SODIUM PHOSPHATE 10 MG/ML IJ SOLN
INTRAMUSCULAR | Status: DC | PRN
  Administered 2017-04-28: 10 mg via INTRAVENOUS

## 2017-04-28 MED ORDER — CHLORHEXIDINE GLUCONATE 4 % EX LIQD: 60.0000 mL | Freq: Once | CUTANEOUS | Status: DC

## 2017-04-28 MED ORDER — INSULIN ASPART 100 UNIT/ML ~~LOC~~ SOLN
0.0000 [IU] | Freq: Three times a day (TID) | SUBCUTANEOUS | Status: DC
  Administered 2017-04-29 (×2): 8 [IU] via SUBCUTANEOUS
  Administered 2017-04-29: 5 [IU] via SUBCUTANEOUS
  Administered 2017-04-30: 3 [IU] via SUBCUTANEOUS

## 2017-04-28 MED ORDER — METFORMIN HCL ER 500 MG PO TB24
2000.0000 mg | ORAL_TABLET | Freq: Every day | ORAL | Status: DC
  Administered 2017-04-29 – 2017-04-30 (×2): 2000 mg via ORAL
  Filled 2017-04-28 (×2): qty 4

## 2017-04-28 MED ORDER — DOCUSATE SODIUM 100 MG PO CAPS
100.0000 mg | ORAL_CAPSULE | Freq: Two times a day (BID) | ORAL | Status: DC
  Administered 2017-04-28 – 2017-04-30 (×4): 100 mg via ORAL
  Filled 2017-04-28 (×5): qty 1

## 2017-04-28 MED ORDER — PROPOFOL 10 MG/ML IV BOLUS
INTRAVENOUS | Status: AC
  Filled 2017-04-28: qty 20

## 2017-04-28 MED ORDER — ONDANSETRON HCL 4 MG PO TABS: 4.0000 mg | ORAL_TABLET | Freq: Four times a day (QID) | ORAL | Status: DC | PRN

## 2017-04-28 MED ORDER — BUPIVACAINE HCL (PF) 0.5 % IJ SOLN
INTRAMUSCULAR | Status: DC | PRN
  Administered 2017-04-28: 20 mL

## 2017-04-28 MED ORDER — PHENYLEPHRINE HCL 10 MG/ML IJ SOLN
INTRAVENOUS | Status: DC | PRN
  Administered 2017-04-28: 15 ug/min via INTRAVENOUS

## 2017-04-28 MED ORDER — ASPIRIN EC 325 MG PO TBEC: 325.0000 mg | DELAYED_RELEASE_TABLET | Freq: Two times a day (BID) | ORAL | 0 refills | Status: DC

## 2017-04-28 MED ORDER — FENTANYL CITRATE (PF) 100 MCG/2ML IJ SOLN
100.0000 ug | Freq: Once | INTRAMUSCULAR | Status: AC
  Administered 2017-04-28: 100 ug via INTRAVENOUS

## 2017-04-28 MED ORDER — CARVEDILOL 6.25 MG PO TABS
6.2500 mg | ORAL_TABLET | Freq: Two times a day (BID) | ORAL | Status: DC
  Administered 2017-04-28 – 2017-04-30 (×4): 6.25 mg via ORAL
  Filled 2017-04-28 (×4): qty 1

## 2017-04-28 MED ORDER — OXYCODONE HCL 5 MG PO TABS
5.0000 mg | ORAL_TABLET | ORAL | Status: DC | PRN
Start: 2017-04-28 — End: 2017-04-30
  Administered 2017-04-28 – 2017-04-30 (×10): 10 mg via ORAL
  Filled 2017-04-28 (×9): qty 2

## 2017-04-28 MED ORDER — GABAPENTIN 300 MG PO CAPS
300.0000 mg | ORAL_CAPSULE | Freq: Two times a day (BID) | ORAL | Status: DC
  Administered 2017-04-28 – 2017-04-30 (×4): 300 mg via ORAL
  Filled 2017-04-28 (×4): qty 1

## 2017-04-28 MED ORDER — FENTANYL CITRATE (PF) 100 MCG/2ML IJ SOLN
INTRAMUSCULAR | Status: AC
  Administered 2017-04-28: 100 ug via INTRAVENOUS
  Filled 2017-04-28: qty 2

## 2017-04-28 MED ORDER — INSULIN GLARGINE 100 UNITS/ML SOLOSTAR PEN
25.0000 [IU] | PEN_INJECTOR | Freq: Every day | SUBCUTANEOUS | Status: DC
  Administered 2017-04-28: 25 [IU] via SUBCUTANEOUS
  Filled 2017-04-28: qty 3

## 2017-04-28 MED ORDER — SODIUM CHLORIDE 0.9 % IJ SOLN
INTRAMUSCULAR | Status: DC | PRN
  Administered 2017-04-28: 10 mL

## 2017-04-28 MED ORDER — FUROSEMIDE 40 MG PO TABS
40.0000 mg | ORAL_TABLET | Freq: Every day | ORAL | Status: DC
  Administered 2017-04-29 – 2017-04-30 (×2): 40 mg via ORAL
  Filled 2017-04-28 (×3): qty 1

## 2017-04-28 MED ORDER — OXYCODONE HCL 5 MG PO TABS
ORAL_TABLET | ORAL | Status: AC
  Administered 2017-04-28: 10 mg via ORAL
  Filled 2017-04-28: qty 2

## 2017-04-28 MED ORDER — MIDAZOLAM HCL 2 MG/2ML IJ SOLN
INTRAMUSCULAR | Status: AC
  Administered 2017-04-28: 1 mg via INTRAVENOUS
  Filled 2017-04-28: qty 2

## 2017-04-28 MED ORDER — METHOCARBAMOL 500 MG PO TABS
500.0000 mg | ORAL_TABLET | Freq: Four times a day (QID) | ORAL | Status: DC | PRN
  Administered 2017-04-28 – 2017-04-30 (×4): 500 mg via ORAL
  Filled 2017-04-28 (×3): qty 1

## 2017-04-28 MED ORDER — CEFAZOLIN SODIUM-DEXTROSE 2-4 GM/100ML-% IV SOLN
2.0000 g | Freq: Four times a day (QID) | INTRAVENOUS | Status: AC
  Administered 2017-04-28 – 2017-04-29 (×2): 2 g via INTRAVENOUS
  Filled 2017-04-28 (×2): qty 100

## 2017-04-28 MED ORDER — ACETAMINOPHEN 650 MG RE SUPP: 650.0000 mg | RECTAL | Status: DC | PRN

## 2017-04-28 MED ORDER — DOCUSATE SODIUM 100 MG PO CAPS: 100.0000 mg | ORAL_CAPSULE | Freq: Two times a day (BID) | ORAL | 0 refills | Status: DC

## 2017-04-28 MED ORDER — TRANEXAMIC ACID 1000 MG/10ML IV SOLN
1000.0000 mg | Freq: Once | INTRAVENOUS | Status: AC
  Administered 2017-04-28: 1000 mg via INTRAVENOUS
  Filled 2017-04-28: qty 10

## 2017-04-28 MED ORDER — ALUM & MAG HYDROXIDE-SIMETH 200-200-20 MG/5ML PO SUSP: 30.0000 mL | ORAL | Status: DC | PRN

## 2017-04-28 MED ORDER — BUPIVACAINE HCL (PF) 0.5 % IJ SOLN
INTRAMUSCULAR | Status: AC
  Filled 2017-04-28: qty 30

## 2017-04-28 MED ORDER — POLYETHYLENE GLYCOL 3350 17 G PO PACK: 17.0000 g | PACK | Freq: Every day | ORAL | Status: DC | PRN

## 2017-04-28 MED ORDER — LACTATED RINGERS IV SOLN
INTRAVENOUS | Status: DC | PRN
  Administered 2017-04-28 (×2): via INTRAVENOUS

## 2017-04-28 MED ORDER — ACETAMINOPHEN 325 MG PO TABS
650.0000 mg | ORAL_TABLET | ORAL | Status: DC | PRN
  Administered 2017-04-29: 650 mg via ORAL
  Filled 2017-04-28 (×2): qty 2

## 2017-04-28 MED ORDER — MIDAZOLAM HCL 2 MG/2ML IJ SOLN
1.0000 mg | Freq: Once | INTRAMUSCULAR | Status: AC
  Administered 2017-04-28: 1 mg via INTRAVENOUS

## 2017-04-28 MED ORDER — TAMSULOSIN HCL 0.4 MG PO CAPS
0.4000 mg | ORAL_CAPSULE | Freq: Every day | ORAL | Status: DC
  Administered 2017-04-29 – 2017-04-30 (×2): 0.4 mg via ORAL
  Filled 2017-04-28 (×2): qty 1

## 2017-04-28 MED ORDER — LACTATED RINGERS IV SOLN: INTRAVENOUS | Status: DC

## 2017-04-28 MED ORDER — ONDANSETRON HCL 4 MG/2ML IJ SOLN: 4.0000 mg | Freq: Four times a day (QID) | INTRAMUSCULAR | Status: DC | PRN

## 2017-04-28 MED ORDER — ATORVASTATIN CALCIUM 40 MG PO TABS
40.0000 mg | ORAL_TABLET | Freq: Every day | ORAL | Status: DC
  Administered 2017-04-28 – 2017-04-30 (×3): 40 mg via ORAL
  Filled 2017-04-28 (×3): qty 1

## 2017-04-28 MED ORDER — PROPOFOL 500 MG/50ML IV EMUL
INTRAVENOUS | Status: DC | PRN
  Administered 2017-04-28: 100 ug/kg/min via INTRAVENOUS

## 2017-04-28 MED ORDER — MAGNESIUM CITRATE PO SOLN: 1.0000 | Freq: Once | ORAL | Status: DC | PRN

## 2017-04-28 MED ORDER — SODIUM CHLORIDE 0.9 % IR SOLN
Status: DC | PRN
  Administered 2017-04-28: 3000 mL

## 2017-04-28 MED ORDER — CANAGLIFLOZIN 100 MG PO TABS
100.0000 mg | ORAL_TABLET | Freq: Every day | ORAL | Status: DC
  Administered 2017-04-29 – 2017-04-30 (×3): 100 mg via ORAL
  Filled 2017-04-28 (×2): qty 1

## 2017-04-28 SURGICAL SUPPLY — 60 items
BANDAGE ESMARK 6X9 LF (GAUZE/BANDAGES/DRESSINGS) ×1 IMPLANT
BENZOIN TINCTURE PRP APPL 2/3 (GAUZE/BANDAGES/DRESSINGS) ×3 IMPLANT
BLADE SAGITTAL 25.0X1.19X90 (BLADE) ×2 IMPLANT
BLADE SAGITTAL 25.0X1.19X90MM (BLADE) ×1
BLADE SAW SAG 90X13X1.27 (BLADE) ×3 IMPLANT
BNDG ESMARK 6X9 LF (GAUZE/BANDAGES/DRESSINGS) ×3
BOWL SMART MIX CTS (DISPOSABLE) ×3 IMPLANT
CAPT KNEE TOTAL 3 ATTUNE ×3 IMPLANT
CEMENT HV SMART SET (Cement) ×6 IMPLANT
CLOSURE STERI-STRIP 1/2X4 (GAUZE/BANDAGES/DRESSINGS) ×1
CLOSURE WOUND 1/2 X4 (GAUZE/BANDAGES/DRESSINGS)
CLSR STERI-STRIP ANTIMIC 1/2X4 (GAUZE/BANDAGES/DRESSINGS) ×2 IMPLANT
COVER SURGICAL LIGHT HANDLE (MISCELLANEOUS) ×3 IMPLANT
CUFF TOURNIQUET SINGLE 34IN LL (TOURNIQUET CUFF) ×3 IMPLANT
CUFF TOURNIQUET SINGLE 44IN (TOURNIQUET CUFF) IMPLANT
DRAPE EXTREMITY T 121X128X90 (DRAPE) ×3 IMPLANT
DRAPE U-SHAPE 47X51 STRL (DRAPES) ×3 IMPLANT
DRSG AQUACEL AG ADV 3.5X10 (GAUZE/BANDAGES/DRESSINGS) ×3 IMPLANT
DRSG PAD ABDOMINAL 8X10 ST (GAUZE/BANDAGES/DRESSINGS) ×3 IMPLANT
DURAPREP 26ML APPLICATOR (WOUND CARE) ×3 IMPLANT
ELECT REM PT RETURN 9FT ADLT (ELECTROSURGICAL) ×3
ELECTRODE REM PT RTRN 9FT ADLT (ELECTROSURGICAL) ×1 IMPLANT
EVACUATOR 1/8 PVC DRAIN (DRAIN) IMPLANT
FACESHIELD WRAPAROUND (MASK) ×3 IMPLANT
GAUZE SPONGE 4X4 12PLY STRL (GAUZE/BANDAGES/DRESSINGS) ×3 IMPLANT
GLOVE BIOGEL PI IND STRL 8 (GLOVE) ×2 IMPLANT
GLOVE BIOGEL PI INDICATOR 8 (GLOVE) ×4
GLOVE ECLIPSE 7.5 STRL STRAW (GLOVE) ×6 IMPLANT
GOWN STRL REUS W/ TWL LRG LVL3 (GOWN DISPOSABLE) ×1 IMPLANT
GOWN STRL REUS W/ TWL XL LVL3 (GOWN DISPOSABLE) ×2 IMPLANT
GOWN STRL REUS W/TWL LRG LVL3 (GOWN DISPOSABLE) ×2
GOWN STRL REUS W/TWL XL LVL3 (GOWN DISPOSABLE) ×4
HANDPIECE INTERPULSE COAX TIP (DISPOSABLE) ×2
HOOD PEEL AWAY FACE SHEILD DIS (HOOD) ×6 IMPLANT
IMMOBILIZER KNEE 22 UNIV (SOFTGOODS) ×3 IMPLANT
KIT BASIN OR (CUSTOM PROCEDURE TRAY) ×3 IMPLANT
KIT ROOM TURNOVER OR (KITS) ×3 IMPLANT
MANIFOLD NEPTUNE II (INSTRUMENTS) ×3 IMPLANT
NEEDLE 22X1 1/2 (OR ONLY) (NEEDLE) ×3 IMPLANT
NS IRRIG 1000ML POUR BTL (IV SOLUTION) ×3 IMPLANT
PACK TOTAL JOINT (CUSTOM PROCEDURE TRAY) ×3 IMPLANT
PAD ABD 8X10 STRL (GAUZE/BANDAGES/DRESSINGS) ×3 IMPLANT
PAD ARMBOARD 7.5X6 YLW CONV (MISCELLANEOUS) ×6 IMPLANT
PADDING CAST COTTON 6X4 STRL (CAST SUPPLIES) ×3 IMPLANT
SET HNDPC FAN SPRY TIP SCT (DISPOSABLE) ×1 IMPLANT
STAPLER VISISTAT 35W (STAPLE) IMPLANT
STRIP CLOSURE SKIN 1/2X4 (GAUZE/BANDAGES/DRESSINGS) IMPLANT
SUCTION FRAZIER HANDLE 10FR (MISCELLANEOUS) ×2
SUCTION TUBE FRAZIER 10FR DISP (MISCELLANEOUS) ×1 IMPLANT
SUT MNCRL AB 3-0 PS2 18 (SUTURE) IMPLANT
SUT VIC AB 0 CTB1 27 (SUTURE) ×6 IMPLANT
SUT VIC AB 1 CT1 27 (SUTURE) ×4
SUT VIC AB 1 CT1 27XBRD ANBCTR (SUTURE) ×2 IMPLANT
SUT VIC AB 2-0 CTB1 (SUTURE) ×6 IMPLANT
SYR 50ML LL SCALE MARK (SYRINGE) ×3 IMPLANT
TOWEL OR 17X24 6PK STRL BLUE (TOWEL DISPOSABLE) ×3 IMPLANT
TOWEL OR 17X26 10 PK STRL BLUE (TOWEL DISPOSABLE) ×3 IMPLANT
TRAY CATH 16FR W/PLASTIC CATH (SET/KITS/TRAYS/PACK) IMPLANT
TRAY FOLEY W/METER SILVER 16FR (SET/KITS/TRAYS/PACK) IMPLANT
WRAP KNEE MAXI GEL POST OP (GAUZE/BANDAGES/DRESSINGS) ×3 IMPLANT

## 2017-04-28 NOTE — Transfer of Care (Signed)
Immediate Anesthesia Transfer of Care Note  Patient: Paul [name]  Procedure(s) Performed: TOTAL KNEE ARTHROPLASTY LEFT (Left )  Patient Location: PACU  Anesthesia Type:General  Level of Consciousness: awake, alert  and oriented  Airway & Oxygen Therapy: Patient Spontanous Breathing and Patient connected to face mask oxygen  Post-op Assessment: Report given to RN and Post -op Vital signs reviewed and stable  Post vital signs: Reviewed and stable  Last Vitals:  Vitals:   04/28/17 1325 04/28/17 1327  BP: 123/73 111/72  Pulse: 69 73  Resp: 18 18  Temp:    SpO2: 100% 100%    Last Pain:  Vitals:   04/28/17 1152  TempSrc: Oral         Complications: No apparent anesthesia complications

## 2017-04-28 NOTE — Anesthesia Procedure Notes (Signed)
Spinal  Start time: 04/28/2017 2:16 PM End time: 04/28/2017 2:20 PM Staffing Anesthesiologist: Lillia Abed, MD Performed: anesthesiologist  Preanesthetic Checklist Completed: patient identified, surgical consent, pre-op evaluation, timeout performed, IV checked, risks and benefits discussed and monitors and equipment checked Spinal Block Patient position: sitting Prep: DuraPrep Patient monitoring: blood pressure, continuous pulse ox, cardiac monitor and heart rate Approach: right paramedian Location: L3-4 Injection technique: single-shot Needle Needle type: Pencan  Needle gauge: 24 G Needle length: 9 cm Needle insertion depth: 8 cm

## 2017-04-28 NOTE — Progress Notes (Signed)
Orthopedic Tech Progress Note Patient Details:  Paul [name] 1945/11/08 [id number]  CPM Left Knee CPM Left Knee: On Left Knee Flexion (Degrees): 90 Left Knee Extension (Degrees): 0   [name] 04/28/2017, 6:09 PM

## 2017-04-28 NOTE — Anesthesia Procedure Notes (Signed)
Spinal      Patient location during procedure: OR

## 2017-04-28 NOTE — H&P (Signed)
TOTAL KNEE ADMISSION H&P  Patient is being admitted for left total knee arthroplasty.  Subjective:  Chief Complaint:left knee pain.  HPI: Paul [name], 71 y.o. male, has a history of pain and functional disability in the left knee due to arthritis and has failed non-surgical conservative treatments for greater than 12 weeks to includeNSAID's and/or analgesics, corticosteriod injections, viscosupplementation injections, weight reduction as appropriate and activity modification.  Onset of symptoms was gradual, starting 4 years ago with gradually worsening course since that time. The patient noted no past surgery on the left knee(s).  Patient currently rates pain in the left knee(s) at 9 out of 10 with activity. Patient has night pain, worsening of pain with activity and weight bearing, pain that interferes with activities of daily living, pain with passive range of motion, crepitus and joint swelling.  Patient has evidence of subchondral sclerosis, periarticular osteophytes and joint space narrowing by imaging studies. This patient has had Failure of all reasonable conservative care. There is no active infection. Patient has known diabetes and has been fighting trying to get his hemoglobin A1c down to acceptable range. He is currently at 8.0 which the lowest he has been in the last 3 years. We feel that this is the best he is going to get. We plan on proceeding with the clearance of his medical doctor that I spoke to last week because it was her feeling that this was the best he would get.  Patient Active Problem List   Diagnosis Date Noted  . 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 Chronic systolic CHF (congestive heart failure) ([place]) 05/04/2014  . PVC's (premature ventricular contractions) 06/01/2013  . Essential hypertension 05/17/2013  . Automatic implantable cardiac defibrillator medtronic dual 03/09/2013  . Orthostatic lightheadedness  11/27/2012  . Cardiomyopathy, dilated, nonischemic ([place]) 10/20/2012  . Ventricular tachycardia/frequent PVCs 10/20/2012  . Poorly controlled type 2 diabetes mellitus with peripheral neuropathy ([place]) 10/20/2012  . Hypogonadism male 10/20/2012   Past Medical History:  Diagnosis Date  . AICD (automatic cardioverter/defibrillator) present    "on last leg not going to replace"  . Arthritis    "thumbs, neck, knees" (06/01/2013)  . Cardiomyopathy, dilated, nonischemic ([place]) 10/20/2012  . Cataract   . CHF (congestive heart failure) ([place])    "mild" (06/01/2013)  . Dysrhythmia   . Erectile dysfunction   . GERD (gastroesophageal reflux disease)   . Hypertension   . Hypogonadism male   . Hypovitaminosis D   . 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 Presence of permanent cardiac pacemaker   . Sleep apnea    cpap  . Type II diabetes mellitus ([place])   . Ventricular tachycardia ([place])    observed during stress testing, subsequent VT on EPS    Past Surgical History:  Procedure Laterality Date  . ABLATION  06-01-2013   PVC's ablated along basal inferoseptal RV by Dr Rayann Heman  . APPENDECTOMY  1969  . CARDIAC CATHETERIZATION  04/2010  . CARDIAC DEFIBRILLATOR PLACEMENT  05/09/2010   MDT ICD implanted in [place] by Dr Ky Barban  . CATARACT EXTRACTION W/ INTRAOCULAR LENS  IMPLANT, BILATERAL Bilateral   . KNEE ARTHROSCOPY Left 1984  . LUMBAR LAMINECTOMY  ~ 2004  . TONSILLECTOMY AND ADENOIDECTOMY  1972  . VASECTOMY    . VENTRICULAR ABLATION SURGERY  06/01/2013    Current Facility-Administered Medications  Medication Dose Route Frequency Provider Last Rate Last Dose  . bupivacaine liposome (EXPAREL) 1.3 % injection 266 mg  20 mL Infiltration Once Dorna Leitz, MD      . bupivacaine liposome (EXPAREL) 1.3 % injection 266 mg  20 mL Infiltration Once Dorna Leitz, MD      . ceFAZolin (ANCEF) IVPB 2g/100 mL premix  2 g Intravenous On Call to OR Dorna Leitz, MD      . chlorhexidine (HIBICLENS) 4 % liquid 4 application  60 mL Topical Once Dorna Leitz, MD      . fentaNYL (SUBLIMAZE) 100 MCG/2ML injection           . lactated ringers infusion   Intravenous Continuous Massagee, Coralyn Mark, MD      . midazolam (VERSED) 2 MG/2ML injection           . tranexamic acid (CYKLOKAPRON) 1,000 mg in sodium chloride 0.9 % 100 mL IVPB  1,000 mg Intravenous To OR Conda Wannamaker, MD      . tranexamic acid (CYKLOKAPRON) 2,000 mg in sodium chloride 0.9 % 50 mL Topical Application  6,754 mg Topical To OR Dorna Leitz, MD       Allergies  Allergen Reactions  . Lisinopril     angioedema    Social History   Tobacco Use  . Smoking status: Never Smoker  . Smokeless tobacco: Never Used  Substance Use Topics  . Alcohol use: Yes    Alcohol/week: 1.2 oz    Types: 1 Glasses of wine, 1 Cans of beer per week    Family History  Problem Relation Age of Onset  . Alcohol abuse Mother   . COPD Mother   . Depression Mother   . Heart disease Mother   . Hypertension Mother   . Stroke Mother   . COPD Father   . Heart disease Father   . Hypertension Father   . Stroke Father   . Cancer Father   . Diabetes Sister   . Mental retardation Sister   . Learning disabilities Sister   . Hypertension Sister   . Alcohol abuse Brother   . Drug abuse Brother   . Heart disease Brother   . Hypertension Brother   . Coronary artery disease Brother      ROS ROS: I have reviewed the patient's review of systems thoroughly and there are no positive responses as relates to the HPI. Objective:  Physical Exam  Vital signs in last 24 hours: Temp:  [97.9 F (36.6 C)] 97.9 F (36.6 C) (11/12 1152) Pulse Rate:  [64] 64 (11/12 1152) Resp:  [18] 18 (11/12 1152) BP: (119)/(70) 119/70 (11/12 1152) SpO2:  [100 %] 100 % (11/12 1152) Weight:  [109.3 kg (241 lb)] 109.3 kg (241 lb) (11/12 1152) Well-developed well-nourished patient in no acute distress. Alert and oriented  x3 HEENT:within normal limits Cardiac: Regular rate and rhythm Pulmonary: Lungs clear to auscultation Abdomen: Soft and nontender.  Normal active bowel sounds  Musculoskeletal: (Left knee: Limited range of motion. Painful range of motion. Trace effusion. No instability. Neurovascularly intact distally. Labs: Recent Results (from the past 2160 hour(s))  Glucose, capillary     Status: Abnormal   Collection Time: 04/25/17  9:12 AM  Result Value Ref Range   Glucose-Capillary 280 (H) 65 - 99 mg/dL   Comment 1 Notify RN    Comment 2 Document in Chart   Surgical pcr screen     Status: None   Collection Time: 04/25/17  9:37 AM  Result Value Ref Range   MRSA, PCR NEGATIVE NEGATIVE   Staphylococcus aureus NEGATIVE NEGATIVE  Comment: (NOTE) The Xpert SA Assay (FDA approved for NASAL specimens in patients 75 years of age and older), is one component of a comprehensive surveillance program. It is not intended to diagnose infection nor to guide or monitor treatment.   CBC     Status: None   Collection Time: 04/25/17 10:00 AM  Result Value Ref Range   WBC 6.5 4.0 - 10.5 K/uL   RBC 4.44 4.22 - 5.81 MIL/uL   Hemoglobin 13.5 13.0 - 17.0 g/dL   HCT 40.5 39.0 - 52.0 %   MCV 91.2 78.0 - 100.0 fL   MCH 30.4 26.0 - 34.0 pg   MCHC 33.3 30.0 - 36.0 g/dL   RDW 14.3 11.5 - 15.5 %   Platelets 196 150 - 400 K/uL  Basic metabolic panel     Status: Abnormal   Collection Time: 04/25/17 10:00 AM  Result Value Ref Range   Sodium 135 135 - 145 mmol/L   Potassium 4.1 3.5 - 5.1 mmol/L   Chloride 101 101 - 111 mmol/L   CO2 24 22 - 32 mmol/L   Glucose, Bld 277 (H) 65 - 99 mg/dL   BUN 35 (H) 6 - 20 mg/dL   Creatinine, Ser 1.55 (H) 0.61 - 1.24 mg/dL   Calcium 8.9 8.9 - 10.3 mg/dL   GFR calc non Af Amer 44 (L) >60 mL/min   GFR calc Af Amer 51 (L) >60 mL/min    Comment: (NOTE) The eGFR has been calculated using the CKD EPI equation. This calculation has not been validated in all clinical  situations. eGFR's persistently <60 mL/min signify possible Chronic Kidney Disease.    Anion gap 10 5 - 15  Hemoglobin A1c     Status: Abnormal   Collection Time: 04/25/17 10:00 AM  Result Value Ref Range   Hgb A1c MFr Bld 8.0 (H) 4.8 - 5.6 %    Comment: (NOTE) Pre diabetes:          5.7%-6.4% Diabetes:              >6.4% Glycemic control for   <7.0% adults with diabetes    Mean Plasma Glucose 182.9 mg/dL  Glucose, capillary     Status: Abnormal   Collection Time: 04/28/17 11:40 AM  Result Value Ref Range   Glucose-Capillary 126 (H) 65 - 99 mg/dL  Differential     Status: None   Collection Time: 04/28/17 11:48 AM  Result Value Ref Range   Neutrophils Relative % 64 %   Neutro Abs 4.8 1.7 - 7.7 K/uL   Lymphocytes Relative 26 %   Lymphs Abs 1.9 0.7 - 4.0 K/uL   Monocytes Relative 5 %   Monocytes Absolute 0.4 0.1 - 1.0 K/uL   Eosinophils Relative 4 %   Eosinophils Absolute 0.3 0.0 - 0.7 K/uL   Basophils Relative 1 %   Basophils Absolute 0.1 0.0 - 0.1 K/uL  Protime-INR     Status: None   Collection Time: 04/28/17 11:48 AM  Result Value Ref Range   Prothrombin Time 13.8 11.4 - 15.2 seconds   INR 1.07   Type and screen Order type and screen if day of surgery is less than 15 days from draw of preadmission visit or order morning of surgery if day of surgery is greater than 6 days from preadmission visit.     Status: None (Preliminary result)   Collection Time: 04/28/17 11:53 AM  Result Value Ref Range   ABO/RH(D) PENDING    Antibody Screen PENDING  Sample Expiration 05/01/2017     Estimated body mass index is 32.69 kg/m as calculated from the following:   Height as of 04/25/17: 6' (1.829 m).   Weight as of this encounter: 109.3 kg (241 lb).   Imaging Review Plain radiographs demonstrate severe degenerative joint disease of the left knee(s). The overall alignment ismild varus. The bone quality appears to be fair for age and reported activity  level.  Assessment/Plan:  End stage arthritis, left knee   The patient history, physical examination, clinical judgment of the provider and imaging studies are consistent with end stage degenerative joint disease of the left knee(s) and total knee arthroplasty is deemed medically necessary. The treatment options including medical management, injection therapy arthroscopy and arthroplasty were discussed at length. The risks and benefits of total knee arthroplasty were presented and reviewed. The risks due to aseptic loosening, infection, stiffness, patella tracking problems, thromboembolic complications and other imponderables were discussed. The patient acknowledged the explanation, agreed to proceed with the plan and consent was signed. Patient is being admitted for inpatient treatment for surgery, pain control, PT, OT, prophylactic antibiotics, VTE prophylaxis, progressive ambulation and ADL's and discharge planning. The patient is planning to be discharged home with home health services

## 2017-04-28 NOTE — Anesthesia Procedure Notes (Addendum)
Anesthesia Regional Block: Adductor canal block   Pre-Anesthetic Checklist: ,, timeout performed, Correct Patient, Correct Site, Correct Laterality, Correct Procedure, Correct Position, site marked, Risks and benefits discussed,  Surgical consent,  Pre-op evaluation,  At surgeon's request and post-op pain management  Laterality: Left and Lower  Prep: chloraprep       Needles:   Needle Type: Echogenic Stimulator Needle     Needle Length: 9cm  Needle Gauge: 21   Needle insertion depth: 6 cm   Additional Needles:   Procedures:,,,, ultrasound used (permanent image in chart),,,,  Narrative:  Start time: 04/28/2017 1:05 PM End time: 04/28/2017 1:15 PM Injection made incrementally with aspirations every 5 mL.  Performed by: Personally  Anesthesiologist: Rica Koyanagi, MD  Additional Notes: tolerated well

## 2017-04-28 NOTE — Brief Op Note (Signed)
04/28/2017  3:55 PM  PATIENT:  [name]  71 y.o. male  PRE-OPERATIVE DIAGNOSIS:  OSTEOARTHRITIS LEFT KNEE  POST-OPERATIVE DIAGNOSIS:  OSTEOARTHRITIS LEFT KNEE  PROCEDURE:  Procedure(s): TOTAL KNEE ARTHROPLASTY LEFT (Left)  SURGEON:  Surgeon(s) and Role:    Dorna Leitz, MD - Primary  PHYSICIAN ASSISTANT:   ASSISTANTS: bethune   ANESTHESIA:   spinal  EBL:  200 mL   BLOOD ADMINISTERED:none  DRAINS: none   LOCAL MEDICATIONS USED:  MARCAINE    and OTHER experel  SPECIMEN:  No Specimen  DISPOSITION OF SPECIMEN:  N/A  COUNTS:  YES  TOURNIQUET:   Total Tourniquet Time Documented: Thigh (Left) - 57 minutes Total: Thigh (Left) - 57 minutes   DICTATION: .Other Dictation: Dictation Number [id number]  PLAN OF CARE: Admit to inpatient   PATIENT DISPOSITION:  PACU - hemodynamically stable.   Delay start of Pharmacological VTE agent (>24hrs) due to surgical blood loss or risk of bleeding: no

## 2017-04-28 NOTE — Anesthesia Preprocedure Evaluation (Addendum)
Anesthesia Evaluation  Patient identified by MRN, date of birth, ID band Patient awake    Reviewed: Allergy & Precautions, NPO status , Patient's Chart, lab work & pertinent test results  History of Anesthesia Complications Negative for: history of anesthetic complications  Airway Mallampati: II       Dental   Pulmonary sleep apnea ,    breath sounds clear to auscultation       Cardiovascular hypertension, +CHF  + dysrhythmias + pacemaker + Cardiac Defibrillator  Rhythm:Regular Rate:Normal  Abn EKG noted inchart   Neuro/Psych  Neuromuscular disease    GI/Hepatic GERD  ,  Endo/Other  diabetes  Renal/GU      Musculoskeletal  (+) Arthritis ,   Abdominal (+) + obese,   Peds  Hematology negative hematology ROS (+)   Anesthesia Other Findings   Reproductive/Obstetrics                            Anesthesia Physical Anesthesia Plan  ASA: III  Anesthesia Plan: Spinal   Post-op Pain Management:    Induction: Intravenous  PONV Risk Score and Plan: 2 and Treatment may vary due to age or medical condition, Ondansetron and Dexamethasone  Airway Management Planned: Natural Airway and Simple Face Mask  Additional Equipment:   Intra-op Plan:   Post-operative Plan:   Informed Consent: I have reviewed the patients History and Physical, chart, labs and discussed the procedure including the risks, benefits and alternatives for the proposed anesthesia with the patient or authorized representative who has indicated his/her understanding and acceptance.     Plan Discussed with: CRNA  Anesthesia Plan Comments:         Anesthesia Quick Evaluation

## 2017-04-28 NOTE — Progress Notes (Signed)
Orthopedic Tech Progress Note Patient Details:  Paul [name] 12-03-1945 [id number]  CPM Left Knee CPM Left Knee: Off Left Knee Flexion (Degrees): 90 Left Knee Extension (Degrees): 0  CPM is off at this time.  Kristopher Oppenheim 04/28/2017, 8:28 PM

## 2017-04-28 NOTE — Discharge Instructions (Signed)

## 2017-04-29 ENCOUNTER — Encounter (HOSPITAL_COMMUNITY): Payer: Self-pay | Admitting: General Practice

## 2017-04-29 ENCOUNTER — Other Ambulatory Visit: Payer: Self-pay

## 2017-04-29 LAB — CBC
HEMATOCRIT: 39.3 % (ref 39.0–52.0)
HEMOGLOBIN: 12.9 g/dL — AB (ref 13.0–17.0)
MCH: 30.6 pg (ref 26.0–34.0)
MCHC: 32.8 g/dL (ref 30.0–36.0)
MCV: 93.1 fL (ref 78.0–100.0)
Platelets: 196 10*3/uL (ref 150–400)
RBC: 4.22 MIL/uL (ref 4.22–5.81)
RDW: 14.7 % (ref 11.5–15.5)
WBC: 10.7 10*3/uL — AB (ref 4.0–10.5)

## 2017-04-29 LAB — BASIC METABOLIC PANEL
ANION GAP: 8 (ref 5–15)
BUN: 31 mg/dL — ABNORMAL HIGH (ref 6–20)
CALCIUM: 8.4 mg/dL — AB (ref 8.9–10.3)
CHLORIDE: 104 mmol/L (ref 101–111)
CO2: 24 mmol/L (ref 22–32)
Creatinine, Ser: 1.48 mg/dL — ABNORMAL HIGH (ref 0.61–1.24)
GFR calc non Af Amer: 47 mL/min — ABNORMAL LOW (ref >60)
GFR, EST AFRICAN AMERICAN: 54 mL/min — AB (ref >60)
Glucose, Bld: 268 mg/dL — ABNORMAL HIGH (ref 65–99)
Potassium: 4.8 mmol/L (ref 3.5–5.1)
Sodium: 136 mmol/L (ref 135–145)

## 2017-04-29 LAB — GLUCOSE, CAPILLARY
GLUCOSE-CAPILLARY: 277 mg/dL — AB (ref 65–99)
GLUCOSE-CAPILLARY: 286 mg/dL — AB (ref 65–99)
GLUCOSE-CAPILLARY: 246 mg/dL — AB (ref 65–99)
GLUCOSE-CAPILLARY: 168 mg/dL — AB (ref 65–99)

## 2017-04-29 MED ORDER — INSULIN GLARGINE 100 UNIT/ML ~~LOC~~ SOLN
38.0000 [IU] | Freq: Every day | SUBCUTANEOUS | Status: DC
  Administered 2017-04-29: 38 [IU] via SUBCUTANEOUS
  Filled 2017-04-29: qty 0.38

## 2017-04-29 MED ORDER — INSULIN GLARGINE 100 UNITS/ML SOLOSTAR PEN: 38.0000 [IU] | PEN_INJECTOR | Freq: Every day | SUBCUTANEOUS | Status: DC

## 2017-04-29 NOTE — Progress Notes (Signed)
Physical Therapy Treatment Patient Details Name: Paul [name] MRN: [id number] DOB: [birth year] Today's Date: 04/29/2017    History of Present Illness 71 y.o. male s/p L TKA 04/28/17. PMH includes: PMH includes:  CAD, CHF, nonischemic cardiomyopathy, AICD (Medtronic, implanted 05/09/10), HTN, DM, nonsustained ventricular tachycardia, OSA, GERD. Never smoker. BMI 33    PT Comments    PM session focused on therex. Discussed importance and reason for exercises with patient. Pt demonstrating improved quad strength this session with tolerance to most exercises at this time. Pt agreeable to stair train tomorrow morning.    Follow Up Recommendations  Home health PT;DC plan and follow up therapy as arranged by surgeon     Equipment Recommendations  None recommended by PT    Recommendations for Other Services       Precautions / Restrictions Precautions Precautions: Fall Precaution Comments: L TKA Restrictions Weight Bearing Restrictions: Yes LLE Weight Bearing: Weight bearing as tolerated    Mobility  Bed Mobility                  Transfers                    Ambulation/Gait                 Stairs            Wheelchair Mobility    Modified Rankin (Stroke Patients Only)       Balance                                            Cognition Arousal/Alertness: Awake/alert Behavior During Therapy: WFL for tasks assessed/performed Overall Cognitive Status: Within Functional Limits for tasks assessed                                        Exercises Total Joint Exercises Ankle Circles/Pumps: AROM;Both;20 reps Quad Sets: AROM;Left;5 reps Gluteal Sets: AROM;10 reps;Both Short Arc Quad: AROM;Both;15 reps Heel Slides: AROM;Left;5 reps Hip ABduction/ADduction: AROM;15 reps;Both Straight Leg Raises: AROM;Left;15 reps    General Comments General comments (skin integrity, edema, etc.): Pt requesting to stay  in the room as he is tired tonight.       Pertinent Vitals/Pain Pain Assessment: Faces Faces Pain Scale: Hurts little more Pain Location: L knee Pain Descriptors / Indicators: Operative site guarding;Grimacing Pain Intervention(s): Limited activity within patient's tolerance;Monitored during session;Repositioned    Home Living                      Prior Function            PT Goals (current goals can now be found in the care plan section) Acute Rehab PT Goals Patient Stated Goal: return home with HHPT PT Goal Formulation: With patient Time For Goal Achievement: 05/13/17 Potential to Achieve Goals: Good    Frequency    7X/week      PT Plan Current plan remains appropriate    Co-evaluation              AM-PAC PT "6 Clicks" Daily Activity  Outcome Measure  Difficulty turning over in bed (including adjusting bedclothes, sheets and blankets)?: A Little Difficulty moving from lying on back to sitting on the side of the bed? :  A Little Difficulty sitting down on and standing up from a chair with arms (e.g., wheelchair, bedside commode, etc,.)?: A Little Help needed moving to and from a bed to chair (including a wheelchair)?: A Little Help needed walking in hospital room?: A Little Help needed climbing 3-5 steps with a railing? : A Little 6 Click Score: 18    End of Session Equipment Utilized During Treatment: Gait belt Activity Tolerance: Patient tolerated treatment well Patient left: with call bell/phone within reach;with nursing/sitter in room;in bed Nurse Communication: Mobility status PT Visit Diagnosis: Unsteadiness on feet (R26.81);Other abnormalities of gait and mobility (R26.89);Pain Pain - Right/Left: Left Pain - part of body: Knee     Time: 1700-1720 PT Time Calculation (min) (ACUTE ONLY): 20 min  Charges:  $Therapeutic Exercise: 8-22 mins                    G Codes:       Reinaldo Berber, PT, DPT Acute Rehab Services Pager:  [phone]    Reinaldo Berber 04/29/2017, 5:24 PM

## 2017-04-29 NOTE — Progress Notes (Signed)
Subjective: 1 Day Post-Op Procedure(s) (LRB): TOTAL KNEE ARTHROPLASTY LEFT (Left) Patient reports pain as moderate.  Taking fluids okay.  Voiding okay.  Not out of bed yet.  Objective: Vital signs in last 24 hours: Temp:  [97 F (36.1 C)-98 F (36.7 C)] 97.9 F (36.6 C) (11/12 2040) Pulse Rate:  [47-73] 68 (11/12 2040) Resp:  [16-18] 18 (11/12 2040) BP: (102-136)/(51-78) 102/61 (11/12 2040) SpO2:  [97 %-100 %] 98 % (11/12 2040) Weight:  [109.3 kg (241 lb)] 109.3 kg (241 lb) (11/12 1152)  Intake/Output from previous day: 11/12 0701 - 11/13 0700 In: 1100 [I.V.:1100] Out: 500 [Urine:300; Blood:200] Intake/Output this shift: No intake/output data recorded.  Recent Labs    04/29/17 0540  HGB 12.9*   Recent Labs    04/29/17 0540  WBC 10.7*  RBC 4.22  HCT 39.3  PLT 196   Recent Labs    04/29/17 0540  NA 136  K 4.8  CL 104  CO2 24  BUN 31*  CREATININE 1.48*  GLUCOSE 268*  CALCIUM 8.4*   Recent Labs    04/28/17 1148  INR 1.07  Left lower extremity exam:  Neurovascular intact Sensation intact distally Intact pulses distally Dorsiflexion/Plantar flexion intact Incision: dressing C/D/I Compartment soft  Assessment/Plan: 1 Day Post-Op Procedure(s) (LRB): TOTAL KNEE ARTHROPLASTY LEFT (Left) Diabetes mellitus insulin-dependent.  Could be better controlled. Plan: Resume home Lantus dosage at bedtime. Continue sliding scale insulin. Weight-bear as tolerated on the left.  Aggressive use of CPM machine. Continue IV fluids to keep him well-hydrated. Up with therapy Plan for discharge tomorrow  Miguel Medal G 04/29/2017, 8:16 AM

## 2017-04-29 NOTE — Progress Notes (Signed)
Orthopedic Tech Progress Note Patient Details:  Paul [name] 1945-09-29 [id number]  Patient ID: [name], male   DOB: May 29, 1946, 72 y.o.   MRN: [id number] Pt in pain. Will call when ready for cpm  Karolee Stamps 04/29/2017, 6:13 AM

## 2017-04-29 NOTE — Evaluation (Signed)
Physical Therapy Evaluation Patient Details Name: Paul [name] MRN: [id number] DOB: 09/21/1945 Today's Date: 04/29/2017   History of Present Illness  71 y.o. male s/p L TKA 04/28/17. PMH includes: PMH includes:  CAD, CHF, nonischemic cardiomyopathy, AICD ([employer], implanted 05/09/10), HTN, DM, nonsustained ventricular tachycardia, OSA, GERD. Never smoker. BMI 33    Clinical Impression  Patient is s/p above surgery resulting in functional limitations due to the deficits listed below (see PT Problem List). PTA, pt was mod I with all mobility and ADLs without use of AD. Pt lives with wife who is available for 24 hour support. Today, pt presents with post op pain and weakness that is limiting his mobility at this time. Pt is mod I with bed mobility, and benefits from min guarding with transfers and gait. Will progress activity and therex in next visit.  Patient will benefit from skilled PT to increase their independence and safety with mobility to allow discharge to the venue listed below.       Follow Up Recommendations Home health PT;DC plan and follow up therapy as arranged by surgeon    Equipment Recommendations  None recommended by PT    Recommendations for Other Services       Precautions / Restrictions Precautions Precautions: Fall Precaution Comments: L TKA Restrictions Weight Bearing Restrictions: Yes LLE Weight Bearing: Weight bearing as tolerated      Mobility  Bed Mobility Overal bed mobility: Modified Independent             General bed mobility comments: slow but able to supine<>sit without physical assistance   Transfers Overall transfer level: Needs assistance Equipment used: Rolling walker (2 wheeled) Transfers: Sit to/from Stand Sit to Stand: Min guard         General transfer comment: cues for hand placement and safety with RW. min guard for safety.   Ambulation/Gait Ambulation/Gait assistance: Min guard Ambulation Distance (Feet): 150  Feet Assistive device: Rolling walker (2 wheeled) Gait Pattern/deviations: Step-to pattern;Step-through pattern;Decreased stance time - left;Antalgic Gait velocity: decreased   General Gait Details: Cues for safety with RW, increased stance time on LLE.   Stairs            Wheelchair Mobility    Modified Rankin (Stroke Patients Only)       Balance Overall balance assessment: Needs assistance Sitting-balance support: Feet unsupported;No upper extremity supported Sitting balance-Leahy Scale: Good     Standing balance support: Bilateral upper extremity supported Standing balance-Leahy Scale: Fair                               Pertinent Vitals/Pain Pain Assessment: Faces Faces Pain Scale: Hurts little more Pain Location: L knee Pain Descriptors / Indicators: Operative site guarding;Grimacing Pain Intervention(s): Limited activity within patient's tolerance;Monitored during session;Repositioned    Home Living Family/patient expects to be discharged to:: Private residence Living Arrangements: Spouse/significant other Available Help at Discharge: Family;Available 24 hours/day Type of Home: House Home Access: Stairs to enter Entrance Stairs-Rails: Right Entrance Stairs-Number of Steps: 4 Home Layout: Two level;Able to live on main level with bedroom/bathroom Home Equipment: Walker - 2 wheels;Grab bars - toilet      Prior Function Level of Independence: Independent               Hand Dominance        Extremity/Trunk Assessment   Upper Extremity Assessment Upper Extremity Assessment: Overall WFL for tasks assessed  Lower Extremity Assessment Lower Extremity Assessment: LLE deficits/detail LLE Deficits / Details: LLE strength 3-/5 globally due to post op pain and weakness        Communication   Communication: No difficulties  Cognition Arousal/Alertness: Awake/alert Behavior During Therapy: WFL for tasks assessed/performed Overall  Cognitive Status: Within Functional Limits for tasks assessed                                        General Comments General comments (skin integrity, edema, etc.): VSS throughout session.     Exercises Total Joint Exercises Ankle Circles/Pumps: AROM;Both;20 reps Quad Sets: AROM;Left;5 reps Knee Flexion: AROM;Both;10 reps Goniometric ROM: Flexion: 75   Assessment/Plan    PT Assessment Patient needs continued PT services  PT Problem List Decreased strength;Decreased range of motion;Decreased activity tolerance;Decreased balance;Decreased knowledge of use of DME;Pain       PT Treatment Interventions DME instruction;Gait training;Stair training;Functional mobility training;Therapeutic activities;Therapeutic exercise    PT Goals (Current goals can be found in the Care Plan section)  Acute Rehab PT Goals Patient Stated Goal: return home with HHPT PT Goal Formulation: With patient Time For Goal Achievement: 05/13/17 Potential to Achieve Goals: Good    Frequency 7X/week   Barriers to discharge        Co-evaluation               AM-PAC PT "6 Clicks" Daily Activity  Outcome Measure Difficulty turning over in bed (including adjusting bedclothes, sheets and blankets)?: A Little Difficulty moving from lying on back to sitting on the side of the bed? : A Little Difficulty sitting down on and standing up from a chair with arms (e.g., wheelchair, bedside commode, etc,.)?: A Little Help needed moving to and from a bed to chair (including a wheelchair)?: A Little Help needed walking in hospital room?: A Little Help needed climbing 3-5 steps with a railing? : A Lot 6 Click Score: 17    End of Session Equipment Utilized During Treatment: Gait belt Activity Tolerance: Patient tolerated treatment well Patient left: in chair;with call bell/phone within reach;with nursing/sitter in room Nurse Communication: Mobility status PT Visit Diagnosis: Unsteadiness on feet  (R26.81);Other abnormalities of gait and mobility (R26.89);Pain Pain - Right/Left: Left Pain - part of body: Knee    Time: 0820-0848 PT Time Calculation (min) (ACUTE ONLY): 28 min   Charges:   PT Evaluation $PT Eval Low Complexity: 1 Low PT Treatments $Gait Training: 8-22 mins   PT G Codes:        Reinaldo Berber, PT, DPT Acute Rehab Services Pager: [phone]    Reinaldo Berber 04/29/2017, 9:06 AM

## 2017-04-29 NOTE — Op Note (Signed)
NAME:  Paul [name], Paul [name]                 ACCOUNT NO.:  MEDICAL RECORD NO.:  [id number]  LOCATION:                                 FACILITY:  PHYSICIAN:  Alta Corning, M.D.   DATE OF BIRTH:  01/25/1946  DATE OF PROCEDURE:  04/28/2017 DATE OF DISCHARGE:                              OPERATIVE REPORT   PREOPERATIVE DIAGNOSIS:  End-stage degenerative joint disease, bilateral knees, left greater than right.  POSTOPERATIVE DIAGNOSIS:  End-stage degenerative joint disease, bilateral knees, left greater than right.  PROCEDURE:  Left total knee replacement with Attune system size 6 femur, size 7 tibia, an 8-mm bridging bearing, and a 41-mm all-polyethylene patella.  SURGEON:  Alta Corning, MD.  ASSIST:  Gary Fleet, PA.  ANESTHESIA:  Spinal.  BRIEF HISTORY:  Paul [name] is a 71 year old male with a long history and significant complaints of bilateral severe knee pain.  X-ray showed severe end-stage degenerative joint disease with bone-on-bone change. He was having night pain and light activity pain and after failure of all conservative care, he was taken to the operating room for left total knee replacement.  DESCRIPTION OF PROCEDURE:  The patient was taken to the operating room, and after adequate anesthesia was obtained with spinal anesthetic, the patient was placed supine on the operating table.  Left leg was prepped and draped in usual sterile fashion.  Following this, an incision was made for an anterior approach to the knee.  Subcutaneous tissue was dissected down the level of extensor mechanism.  A medial parapatellar arthrotomy was undertaken.  Following this, attention was turned to removal of retropatellar fat pad, synovium on the anterior aspect of the knee, medial and lateral meniscus, and anterior and posterior cruciates. Once this was performed, attention was turned towards the femur where an intramedullary pilot hole was drilled, a 4-degree valgus inclination  cut was made, and a 9-mm distal bone was resected.  Following this, attention was turned to the sizing of the femur, sized with anterior and posterior direction to a 6.  Anterior and posterior cuts were made, chamfers and box.  Attention was then turned to the tibia.  It was cut perpendicular to its long axis.  It sized to a 7.  It was drilled and keeled.  Following this, attention was turned towards placement of a spacer block, which looked like a 7 would fit and at this point, attention was turned towards placing some Exparel in the back of the knee and using electrocautery in the back of the knee.  Once this was completed, attention was turned towards putting the trial components and size 6 femur, size 7 tibia, 7-mm bridging bearing and attention was then turned to the patella.  It was cut down to a level of 13 mm.  A 41 paddle was chosen.  Lugs were drilled for the patella and the patella trial was put in place.  Excellent range of motion and stability were achieved at this point.  Attention was now turned to the removal of trial components and the knee was then copiously and thoroughly lavaged, pulsatile lavage irrigation.  Following this, the final components were cemented into place.  Size 7  tibia, size 6 femur, 7-mm bridging bearing trial was placed and the cement allowed to completely harden.  The 41 patella trial was placed and held with a clamp.  All excess bone and cement were removed.  Once the cement was completely hardened, all excess bone cement had been removed.  The tourniquet let down.  All bleeding was controlled with electrocautery.  The 7 was trialed out for an 8.  I liked the 8 little better and turned with tightness it still was used to come easy full extension and in flexion.  I thought the balance was a little better.  I went with a size 8, opened it and placed it and then irrigated the knee again thoroughly.  Did a medial parapatellar arthrotomy closure followed  by skin with 0 and 2-0 Vicryl and 3-0 Monocryl subcuticular.  Benzoin and Steri-Strips were applied. Sterile compressive dressing was applied.  The patient was taken to the recovery room, was noted to be in satisfactory condition.  Estimated blood loss for the procedure was minimal.  The tourniquet time was about 57 minutes.  The final can be gotten from the anesthetic record.     Alta Corning, M.D.     Corliss Skains  D:  04/28/2017  T:  04/28/2017  Job:  [id number]

## 2017-04-29 NOTE — Anesthesia Postprocedure Evaluation (Signed)
Anesthesia Post Note  Patient: RAFAL ARCHULETA  Procedure(s) Performed: TOTAL KNEE ARTHROPLASTY LEFT (Left )     Patient location during evaluation: PACU Anesthesia Type: Spinal Level of consciousness: oriented and awake and alert Pain management: pain level controlled Vital Signs Assessment: post-procedure vital signs reviewed and stable Respiratory status: spontaneous breathing, respiratory function stable and patient connected to nasal cannula oxygen Cardiovascular status: blood pressure returned to baseline and stable Postop Assessment: no headache, no backache and no apparent nausea or vomiting Anesthetic complications: no    Last Vitals:  Vitals:   04/28/17 1753 04/28/17 2040  BP: 112/78 102/61  Pulse: (!) 47 68  Resp:  18  Temp: 36.7 C 36.6 C  SpO2: 98% 98%    Last Pain:  Vitals:   04/29/17 0532  TempSrc:   PainSc: 6                  Horacio Werth DAVID

## 2017-04-30 ENCOUNTER — Encounter (HOSPITAL_COMMUNITY): Payer: Self-pay | Admitting: Orthopedic Surgery

## 2017-04-30 LAB — CBC
HEMATOCRIT: 34.5 % — AB (ref 39.0–52.0)
HEMOGLOBIN: 11.5 g/dL — AB (ref 13.0–17.0)
MCH: 30.7 pg (ref 26.0–34.0)
MCHC: 33.3 g/dL (ref 30.0–36.0)
MCV: 92.2 fL (ref 78.0–100.0)
Platelets: 177 10*3/uL (ref 150–400)
RBC: 3.74 MIL/uL — AB (ref 4.22–5.81)
RDW: 15 % (ref 11.5–15.5)
WBC: 10.7 10*3/uL — ABNORMAL HIGH (ref 4.0–10.5)

## 2017-04-30 LAB — GLUCOSE, CAPILLARY
GLUCOSE-CAPILLARY: 154 mg/dL — AB (ref 65–99)
Glucose-Capillary: 164 mg/dL — ABNORMAL HIGH (ref 65–99)

## 2017-04-30 MED FILL — tiZANidine HCL 4 MG TABS: 4 | 16 days supply | Qty: 25 | Fill #0

## 2017-04-30 MED FILL — OXYCOD/ACETAMINOPHEN 5-325M: 5-325 | 7 days supply | Qty: 60 | Fill #0

## 2017-04-30 NOTE — Discharge Summary (Signed)
Patient ID: Paul [name] MRN: [id number] DOB/AGE: September 08, 1945 71 y.o.  Admit date: 04/28/2017 Discharge date: 04/30/2017  Admission Diagnoses:  Principal Problem:   Primary osteoarthritis of left knee Active Problems:   Diabetes mellitus, insulin dependent (IDDM), uncontrolled ([place])   Discharge Diagnoses:  Same  Past Medical History:  Diagnosis Date  . AICD (automatic cardioverter/defibrillator) present    "on last leg not going to replace"  . Arthritis    "thumbs, neck, knees" (06/01/2013)  . Cardiomyopathy, dilated, nonischemic ([place]) 10/20/2012  . Cataract   . CHF (congestive heart failure) ([place])    "mild" (06/01/2013)  . Dysrhythmia   . Erectile dysfunction   . GERD (gastroesophageal reflux disease)   . Hypertension   . Hypogonadism male   . Hypovitaminosis D   . 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 Presence of permanent cardiac pacemaker   . Sleep apnea    cpap  . Type II diabetes mellitus ([place])   . Ventricular tachycardia ([place])    observed during stress testing, subsequent VT on EPS    Surgeries: Procedure(s): TOTAL KNEE ARTHROPLASTY LEFT on 04/28/2017   Consultants:   Discharged Condition: Improved  Hospital Course: Paul [name] is an 71 y.o. male who was admitted 04/28/2017 for operative treatment ofPrimary osteoarthritis of left knee. Patient has severe unremitting pain that affects sleep, daily activities, and work/hobbies. After pre-op clearance the patient was taken to the operating room on 04/28/2017 and underwent  Procedure(s): TOTAL KNEE ARTHROPLASTY LEFT.    Patient was given perioperative antibiotics:  Anti-infectives (From admission, onward)   Start     Dose/Rate Route Frequency Ordered Stop   04/28/17 1800  ceFAZolin (ANCEF) IVPB 2g/100 mL premix     2 g 200 mL/hr over 30 Minutes Intravenous Every 6 hours 04/28/17 1749 04/29/17 0252   04/28/17 1132  ceFAZolin (ANCEF) IVPB 2g/100 mL  premix     2 g 200 mL/hr over 30 Minutes Intravenous On call to O.R. 04/28/17 1132 04/28/17 1423       Patient was given sequential compression devices, early ambulation, and chemoprophylaxis to prevent DVT.  Patient benefited maximally from hospital stay and there were no complications.    Recent vital signs:  Patient Vitals for the past 24 hrs:  BP Temp Temp src Pulse Resp SpO2  04/30/17 0615 (!) 108/54 - - - - -  04/30/17 0505 (!) 89/40 98.6 F (37 C) Oral 78 16 99 %  04/29/17 2115 105/60 98.5 F (36.9 C) Oral (!) 58 16 99 %  04/29/17 1415 (!) 91/56 98.5 F (36.9 C) Oral 63 16 99 %     Recent laboratory studies:  Recent Labs    04/28/17 1148 04/29/17 0540 04/30/17 0420  WBC  --  10.7* 10.7*  HGB  --  12.9* 11.5*  HCT  --  39.3 34.5*  PLT  --  196 177  NA  --  136  --   K  --  4.8  --   CL  --  104  --   CO2  --  24  --   BUN  --  31*  --   CREATININE  --  1.48*  --   GLUCOSE  --  268*  --   INR 1.07  --   --   CALCIUM  --  8.4*  --      Discharge Medications:   Allergies as of 04/30/2017      Reactions   Lisinopril  angioedema      Medication List    TAKE these medications   aspirin EC 325 MG tablet Take 1 tablet (325 mg total) 2 (two) times daily after a meal by mouth. Take x 1 month post op to decrease risk of blood clots. What changed:    medication strength  how much to take  when to take this  additional instructions   atorvastatin 40 MG tablet Commonly known as:  LIPITOR Take 1 tablet (40 mg total) by mouth daily.   Blood Glucose Monitor System w/Device Kit Glucose monitor to check blood sugars tid dx code E11.9 brand per insurance coverage   canagliflozin 100 MG Tabs tablet Commonly known as:  INVOKANA Take 1 tablet (100 mg total) by mouth daily before breakfast.   carvedilol 6.25 MG tablet Commonly known as:  COREG Take 1 tablet (6.25 mg total) by mouth 2 (two) times daily.   [employer] VIT D 5000 HIGH-POTENCY PO Take 10,000  Units by mouth daily.   docusate sodium 100 MG capsule Commonly known as:  COLACE Take 1 capsule (100 mg total) 2 (two) times daily by mouth.   furosemide 40 MG tablet Commonly known as:  LASIX Take 40 mg by mouth daily.   glimepiride 4 MG tablet Commonly known as:  AMARYL Take 1 twice a day What changed:    how much to take  how to take this  when to take this  additional instructions   glucose blood test strip Commonly known as:  BAYER CONTOUR NEXT TEST USE TO CHECK BLOOD SUGARS THREE TIMES DAILY   Insulin Glargine 100 UNIT/ML Solostar Pen Commonly known as:  LANTUS SOLOSTAR Inject 38 Units into the skin at bedtime.   Insulin Pen Needle 32G X 4 MM Misc Commonly known as:  CLICKFINE PEN NEEDLES Use 2x a day - with Lantus and Victoza.   Lancets Misc Glucose monitor to check blood sugars tid dx code E11.9 brand per insurance coverage   metFORMIN 500 MG 24 hr tablet Commonly known as:  GLUCOPHAGE-XR Take 4 tablets (2,000 mg total) by mouth daily with breakfast.   oxyCODONE-acetaminophen 5-325 MG tablet Commonly known as:  PERCOCET/ROXICET Take 1-2 tablets every 6 (six) hours as needed by mouth for severe pain.   sacubitril-valsartan 49-51 MG Commonly known as:  ENTRESTO Take 1 tablet by mouth 2 (two) times daily.   spironolactone 25 MG tablet Commonly known as:  ALDACTONE Take 0.5 tablets (12.5 mg total) by mouth daily.   tiZANidine 2 MG tablet Commonly known as:  ZANAFLEX Take 1 tablet (2 mg total) every 8 (eight) hours as needed by mouth for muscle spasms.   VICTOZA 18 MG/3ML Sopn Generic drug:  liraglutide INJECT 1.8 MG INTO THE SKIN DAILY. What changed:  See the new instructions.            Discharge Care Instructions  (From admission, onward)        Start     Ordered   04/30/17 0000  Weight bearing as tolerated    Question Answer Comment  Laterality left   Extremity Lower      04/30/17 0835      Diagnostic Studies: Dg Chest 2  View  Result Date: 04/28/2017 CLINICAL DATA:  Preoperative examination prior to right total knee joint replacement. History of hypertension, diabetes, CHF, cardiomyopathy. EXAM: CHEST  2 VIEW COMPARISON:  Chest x-ray dated June 02, 2017 and chest CT scan of the same day. FINDINGS: The lungs are well-expanded and clear.  The heart and pulmonary vascularity are normal. The mediastinum is normal in width. The ICD is in stable position. There is no pleural effusion. The bony thorax is unremarkable. IMPRESSION: There is no acute cardiopulmonary abnormality. Electronically Signed   By: David  Martinique M.D.   On: 04/28/2017 12:09    Disposition: 01-Home or Self Care  Discharge Instructions    Call MD / Call 911   Complete by:  As directed    If you experience chest pain or shortness of breath, CALL 911 and be transported to the hospital emergency room.  If you develope a fever above 101 F, pus (white drainage) or increased drainage or redness at the wound, or calf pain, call your surgeon's office.   Constipation Prevention   Complete by:  As directed    Drink plenty of fluids.  Prune juice may be helpful.  You may use a stool softener, such as Colace (over the counter) 100 mg twice a day.  Use MiraLax (over the counter) for constipation as needed.   Diet - low sodium heart healthy   Complete by:  As directed    Do not put a pillow under the knee. Place it under the heel.   Complete by:  As directed    Increase activity slowly as tolerated   Complete by:  As directed    Weight bearing as tolerated   Complete by:  As directed    Laterality:  left   Extremity:  Lower      Follow-up Information    Schedule an appointment as soon as possible for a visit with Dorna Leitz, MD.   Specialty:  Orthopedic Surgery Contact information: [place] [address] [phone]            Signed: Erlene Senters 04/30/2017, 8:35 AM

## 2017-04-30 NOTE — Progress Notes (Signed)
Pt  Given discharge instructions and prescriptions. Instructions gone over with him and wife at bedside and answered any questions that they had to satisfaction. Pt's wife to take him home, he states that he has all his equipment at home.

## 2017-04-30 NOTE — Progress Notes (Addendum)
Physical Therapy Treatment Patient Details Name: Paul [name] MRN: [id number] DOB: May 14, 1946 Today's Date: 04/30/2017    History of Present Illness 71 y.o. male s/p L TKA 04/28/17. PMH includes: PMH includes:  CAD, CHF, nonischemic cardiomyopathy, AICD ([employer], implanted 05/09/10), HTN, DM, nonsustained ventricular tachycardia, OSA, GERD. Never smoker. BMI 33    PT Comments    Session focused on gait and stair training. Pt presenting with  increased pain limiting mobility this session from prior, yet vitals remain stable and pt motivated to work with increased activity tolerence.  Extensive time discussing safety on stairs to enter home. Pt able to ascend/descened 2x5 stairs with BUE support with 1 rail and 1 cane and safe with min guard. Pt reports he feels safe and confident to return to home this morning when medically cleared. If pt has not d/c home yet, will see PM to reinforce stair training to maximize safety.     Follow Up Recommendations  Home health PT;DC plan and follow up therapy as arranged by surgeon     Equipment Recommendations  None recommended by PT    Recommendations for Other Services       Precautions / Restrictions Precautions Precautions: Fall Precaution Comments: L TKA Restrictions Weight Bearing Restrictions: Yes LLE Weight Bearing: Weight bearing as tolerated    Mobility  Bed Mobility Overal bed mobility: Modified Independent             General bed mobility comments: slow but able to supine<>sit without physical assistance   Transfers Overall transfer level: Needs assistance Equipment used: Rolling walker (2 wheeled) Transfers: Sit to/from Stand Sit to Stand: Min guard         General transfer comment: cues for hand placement and safety with RW. min guard for safety.   Ambulation/Gait Ambulation/Gait assistance: Min guard Ambulation Distance (Feet): 50 Feet Assistive device: Rolling walker (2 wheeled) Gait  Pattern/deviations: Step-to pattern;Step-through pattern;Decreased stance time - left;Antalgic Gait velocity: decreased   General Gait Details: Cues for safety with RW, increased stance time on LLE.    Stairs Stairs: Yes   Stair Management: One rail Right;With cane Number of Stairs: 12 General stair comments: Extensiev time spent with stair training for safe discharge home today. Cues for sequencing and safety. pt prefers to use cane rather than 2 hand assist on 1 rail. educated patient for family guarding on stairs with proper techinque. Pt reports   Wheelchair Mobility    Modified Rankin (Stroke Patients Only)       Balance Overall balance assessment: Needs assistance Sitting-balance support: Feet unsupported;No upper extremity supported Sitting balance-Leahy Scale: Good     Standing balance support: Bilateral upper extremity supported Standing balance-Leahy Scale: Fair                              Cognition Arousal/Alertness: Awake/alert Behavior During Therapy: WFL for tasks assessed/performed Overall Cognitive Status: Within Functional Limits for tasks assessed                                        Exercises Total Joint Exercises Ankle Circles/Pumps: AROM;Both;20 reps    General Comments General comments (skin integrity, edema, etc.): VSS throughout session.      Pertinent Vitals/Pain Pain Assessment: Faces Pain Score: 6  Pain Location: L knee Pain Descriptors / Indicators: Operative site guarding;Grimacing Pain Intervention(s): Limited  activity within patient's tolerance;Monitored during session;Repositioned    Home Living                      Prior Function            PT Goals (current goals can now be found in the care plan section) Acute Rehab PT Goals Patient Stated Goal: return home with HHPT PT Goal Formulation: With patient Time For Goal Achievement: 05/13/17 Potential to Achieve Goals: Good Progress  towards PT goals: Progressing toward goals    Frequency    7X/week      PT Plan Current plan remains appropriate    Co-evaluation              AM-PAC PT "6 Clicks" Daily Activity  Outcome Measure  Difficulty turning over in bed (including adjusting bedclothes, sheets and blankets)?: A Little Difficulty moving from lying on back to sitting on the side of the bed? : A Little Difficulty sitting down on and standing up from a chair with arms (e.g., wheelchair, bedside commode, etc,.)?: A Little Help needed moving to and from a bed to chair (including a wheelchair)?: A Little Help needed walking in hospital room?: A Little Help needed climbing 3-5 steps with a railing? : A Little 6 Click Score: 18    End of Session Equipment Utilized During Treatment: Gait belt Activity Tolerance: Patient tolerated treatment well Patient left: with call bell/phone within reach;with nursing/sitter in room;in bed Nurse Communication: Mobility status PT Visit Diagnosis: Unsteadiness on feet (R26.81);Other abnormalities of gait and mobility (R26.89);Pain Pain - Right/Left: Left Pain - part of body: Knee     Time: 2423-5361 PT Time Calculation (min) (ACUTE ONLY): 35 min  Charges:  $Gait Training: 23-37 mins                    G Codes:      Reinaldo Berber, PT, DPT Acute Rehab Services Pager: [phone]     Reinaldo Berber 04/30/2017, 9:34 AM

## 2017-04-30 NOTE — Progress Notes (Signed)
Subjective: 2 Days Post-Op Procedure(s) (LRB): TOTAL KNEE ARTHROPLASTY LEFT (Left) Patient reports pain as moderate.  She is progressing with physical therapy.  Taking by mouth and voiding okay.  Blood sugars under better control.  States he is ready to go home today.  Objective: Vital signs in last 24 hours: Temp:  [98.5 F (36.9 C)-98.6 F (37 C)] 98.6 F (37 C) (11/14 0505) Pulse Rate:  [58-78] 78 (11/14 0505) Resp:  [16] 16 (11/14 0505) BP: (89-108)/(40-60) 108/54 (11/14 0615) SpO2:  [99 %] 99 % (11/14 0505)  Intake/Output from previous day: 11/13 0701 - 11/14 0700 In: 2160 [P.O.:960; I.V.:1200] Out: 950 [Urine:950] Intake/Output this shift: No intake/output data recorded.  Recent Labs    04/29/17 0540 04/30/17 0420  HGB 12.9* 11.5*   Recent Labs    04/29/17 0540 04/30/17 0420  WBC 10.7* 10.7*  RBC 4.22 3.74*  HCT 39.3 34.5*  PLT 196 177   Recent Labs    04/29/17 0540  NA 136  K 4.8  CL 104  CO2 24  BUN 31*  CREATININE 1.48*  GLUCOSE 268*  CALCIUM 8.4*   Recent Labs    04/28/17 1148  INR 1.07  Left knee exam:   Neurovascular intact Sensation intact distally Intact pulses distally Dorsiflexion/Plantar flexion intact Incision: dressing C/D/I Compartment soft  Assessment/Plan: 2 Days Post-Op Procedure(s) (LRB): TOTAL KNEE ARTHROPLASTY LEFT (Left)  Diabetes mellitus, insulin-dependent.  Stable. Plan: Weight-bear as tolerated on left. Aspirin 325 mg twice daily for DVT prophylaxis times 1 month postop.  Discharge home with home health Follow-up with Dr. Berenice Primas in 2 weeks. Taje Tondreau G 04/30/2017, 8:26 AM

## 2017-05-12 MED FILL — OXYCOD/ACETAMINOPHEN 5-325M: 5-325 | 10 days supply | Qty: 40 | Fill #0

## 2017-05-15 DIAGNOSIS — M1712 Unilateral primary osteoarthritis, left knee: Secondary | ICD-10-CM | POA: Diagnosis not present

## 2017-05-15 DIAGNOSIS — Z9889 Other specified postprocedural states: Secondary | ICD-10-CM | POA: Diagnosis not present

## 2017-05-15 MED FILL — tiZANidine HCL 4 MG TABS: 4 | 13 days supply | Qty: 40 | Fill #0

## 2017-05-19 ENCOUNTER — Encounter (INDEPENDENT_AMBULATORY_CARE_PROVIDER_SITE_OTHER): Payer: BLUE CROSS/BLUE SHIELD | Admitting: Ophthalmology

## 2017-05-19 DIAGNOSIS — E11319 Type 2 diabetes mellitus with unspecified diabetic retinopathy without macular edema: Secondary | ICD-10-CM

## 2017-05-19 DIAGNOSIS — H35033 Hypertensive retinopathy, bilateral: Secondary | ICD-10-CM | POA: Diagnosis not present

## 2017-05-19 DIAGNOSIS — I10 Essential (primary) hypertension: Secondary | ICD-10-CM

## 2017-05-19 DIAGNOSIS — E113293 Type 2 diabetes mellitus with mild nonproliferative diabetic retinopathy without macular edema, bilateral: Secondary | ICD-10-CM

## 2017-05-19 DIAGNOSIS — H33302 Unspecified retinal break, left eye: Secondary | ICD-10-CM

## 2017-05-19 DIAGNOSIS — H43813 Vitreous degeneration, bilateral: Secondary | ICD-10-CM

## 2017-05-22 MED FILL — SPIRONOLACTONE 25 MG TABLET: 25 | 30 days supply | Qty: 15 | Fill #2

## 2017-05-22 MED FILL — INVOKANA 100 MG TABLET: 100 | 30 days supply | Qty: 30 | Fill #2

## 2017-05-23 MED FILL — OXYCOD/ACETAMINOPHEN 5-325M: 5-325 | 10 days supply | Qty: 40 | Fill #0

## 2017-05-29 MED FILL — CARVEDILOL 6.25 MG TABLET: 6.25 | 30 days supply | Qty: 60 | Fill #3

## 2017-05-29 MED FILL — FUROSEMIDE 40 MG TABS: 40 | 30 days supply | Qty: 30 | Fill #4

## 2017-05-29 MED FILL — METFORMIN HCL ER 500 MG TAB: 500 | 30 days supply | Qty: 120 | Fill #3

## 2017-05-29 MED FILL — ENTRESTO 49 MG-51 MG TABLET: 49-51 | 30 days supply | Qty: 60 | Fill #3

## 2017-05-29 MED FILL — GLIMEPIRIDE 4 MG TABLET: 4 | 30 days supply | Qty: 60 | Fill #3

## 2017-05-29 MED FILL — VICTOZA 18 MG/3 ML INJECT P: 18 | 30 days supply | Qty: 9 | Fill #8

## 2017-05-29 MED FILL — NABUMETONE 500 MG TABLET: 500 | 30 days supply | Qty: 60 | Fill #3

## 2017-05-29 MED FILL — LANTUS SOLOSTAR 100 UNITS/M: 100 | 30 days supply | Qty: 12 | Fill #6

## 2017-05-29 MED FILL — TAMSULOSIN HCL 0.4 MG CAP: 0.4 | 30 days supply | Qty: 30 | Fill #1

## 2017-06-19 ENCOUNTER — Encounter: Payer: Self-pay | Admitting: Internal Medicine

## 2017-06-19 ENCOUNTER — Ambulatory Visit (INDEPENDENT_AMBULATORY_CARE_PROVIDER_SITE_OTHER): Payer: BLUE CROSS/BLUE SHIELD | Admitting: Internal Medicine

## 2017-06-19 VITALS — BP 104/74 | HR 91 | Ht 71.0 in | Wt 231.0 lb

## 2017-06-19 DIAGNOSIS — E1165 Type 2 diabetes mellitus with hyperglycemia: Secondary | ICD-10-CM

## 2017-06-19 DIAGNOSIS — E291 Testicular hypofunction: Secondary | ICD-10-CM

## 2017-06-19 DIAGNOSIS — E1142 Type 2 diabetes mellitus with diabetic polyneuropathy: Secondary | ICD-10-CM | POA: Diagnosis not present

## 2017-06-19 MED FILL — traMADol HCL 50 MG TABS: 50 | 10 days supply | Qty: 40 | Fill #0

## 2017-06-19 MED FILL — INVOKANA 100 MG TABLET: 100 | 30 days supply | Qty: 30 | Fill #3

## 2017-06-19 MED FILL — SPIRONOLACTONE 25 MG TABLET: 25 | 30 days supply | Qty: 15 | Fill #3

## 2017-06-19 NOTE — Progress Notes (Signed)
Patient ID: Paul [name], male   DOB: 19-Dec-1945, 72 y.o.   MRN: [id number]  HPI: Paul [name] is a 72 y.o.-year-old male, returning for f/u for DM2, dx in 2001, uncontrolled, with complications (CKD, mild PN, ED).  Last year 3 months ago.  Since last visit, he had left TKR 04/2017.  He started ambulating the next day and feels good after the surgery.  DM2: Last hemoglobin A1c was: Lab Results  Component Value Date   HGBA1C 8.0 (H) 04/25/2017   HGBA1C 8.5 01/20/2016   HGBA1C 8.1 06/30/2015  09/23/2014: 8.8%  Pt is on a regimen of: - Metformin XR 2000 mg at bedtime - Amaryl 4 mg 2x a day, before meals. - Invokana 100 mg in a.m. - Victoza 1.8 mg daily in am - Lantus 30-35 units at bedtime   Pt checks his sugars 3x a day: - am:  136-180, 210 >> 120-140 >> 87, 110-180 - 2h after b'fast: n/c - before lunch: n/c - 2h after lunch: n/c - before dinner: n/c >> 274 >> n/c >> 85-179 - 2h after dinner: n/c - bedtime: n/c >> 243-284 >> n/c >> 123-182, 228 - nighttime: n/c >> 65, 70s 2x a week >> n/c Lowest sugar was 65 >> 70s ( 3 am >> drank OJ); he has hypoglycemia awareness in the 70s. Highest sugar was 200s >> 228.  Glucometer: OneTouch Ultra  Pt's meals are: - Breakfast: cereals + blueberries - Lunch: chinese food or sandwich, fruit - Dinner: meat + veggies or salad - Snacks: no  He used Nutrisystem 2x before >> 40 lbs lost.   - + CKD: Lab Results  Component Value Date   BUN 31 (H) 04/29/2017   CREATININE 1.48 (H) 04/29/2017  02/19/2017: 19/1.37, GFR 52, glucose 143 09/23/2014: 13/1.1, GFR >60 Angioedema with ACEI.  On Entresto. 02/19/2017: ACR 247 No results found for: MICRALBCREAT  -+ HL; last set of lipids: 02/19/2017: 149/218/30/87  Lab Results  Component Value Date   CHOL 188 01/20/2016   HDL 34 (L) 01/20/2016   LDLCALC 103 01/20/2016   TRIG 255 (H) 01/20/2016   CHOLHDL 5.5 (H) 01/20/2016  09/2014: 158/157/25/100 On Lipitor 40. - last eye exam  was 05/2017: + microaneuryms.  Dr. Zigmund Daniel. - + numbness and tingling in his left foot  H/o hypogonadism - Previously on AndroGel, however, he had to come off since this was not covered by his insurance anymore.  Latest testosterone level was normal off AndroGel:  Last test. Level: Component     Latest Ref Rng & Units 01/20/2016  Testosterone,Total,LC/MS/MS     250 - 1,100 ng/dL 336  Testosterone, Free     35.0 - 155.0 pg/mL 66.2  Sex Hormone Binding Glob.     22 - 77 nmol/L 27   PSA levels have been normal: Lab Results  Component Value Date   PSA 0.9 01/20/2016   PSA 1.50 07/06/2015   PSA 1.16 10/16/2012   Patient has a history of trigeminy, status post endocardial ablation at [facility]. He had an epicardial ablation in 08/2014. During that time, he had 2 stents latest, and the ablation procedure was postponed. He is followed by Dr. Dwana Curd.  He has OSA-on CPAP. H/o DVT L leg >> more swollen.  ROS: Constitutional: no weight gain/no weight loss, no fatigue, no subjective hyperthermia, no subjective hypothermia Eyes: no blurry vision, no xerophthalmia ENT: no sore throat, no nodules palpated in throat, no dysphagia, no odynophagia, no hoarseness Cardiovascular: no CP/no SOB/no palpitations/+  leg swelling Respiratory: no cough/no SOB/no wheezing Gastrointestinal: no N/no V/no D/+ C (Percocet)/no acid reflux Musculoskeletal: no muscle aches/no joint aches Skin: no rashes, no hair loss Neurological: no tremors/+ numbness/+ tingling/no dizziness  I reviewed pt's medications, allergies, PMH, social hx, family hx, and changes were documented in the history of present illness. Otherwise, unchanged from my initial visit note.  Past Medical History:  Diagnosis Date  . AICD (automatic cardioverter/defibrillator) present    "on last leg not going to replace"  . Arthritis    "thumbs, neck, knees" (06/01/2013)  . Cardiomyopathy, dilated, nonischemic ([place]) 10/20/2012  . Cataract   . CHF  (congestive heart failure) ([place])    "mild" (06/01/2013)  . Dysrhythmia   . Erectile dysfunction   . GERD (gastroesophageal reflux disease)   . Hypertension   . Hypogonadism male   . Hypovitaminosis D   . 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 Presence of permanent cardiac pacemaker   . Sleep apnea    cpap  . Type II diabetes mellitus ([place])   . Ventricular tachycardia ([place])    observed during stress testing, subsequent VT on EPS   Past Surgical History:  Procedure Laterality Date  . ABLATION  06-01-2013   PVC's ablated along basal inferoseptal RV by Dr Rayann Heman  . APPENDECTOMY  1969  . CARDIAC CATHETERIZATION  04/2010  . CARDIAC DEFIBRILLATOR PLACEMENT  05/09/2010   MDT ICD implanted in [place] by Dr Ky Barban  . CATARACT EXTRACTION W/ INTRAOCULAR LENS  IMPLANT, BILATERAL Bilateral   . KNEE ARTHROSCOPY Left 1984  . LUMBAR LAMINECTOMY  ~ 2004  . TONSILLECTOMY AND ADENOIDECTOMY  1972  . TOTAL KNEE ARTHROPLASTY Left 04/28/2017  . TOTAL KNEE ARTHROPLASTY Left 04/28/2017   Procedure: TOTAL KNEE ARTHROPLASTY LEFT;  Surgeon: Dorna Leitz, MD;  Location: [facility];  Service: Orthopedics;  Laterality: Left;  . V-TACH ABLATION N/A 06/01/2013   Procedure: V-TACH ABLATION;  Surgeon: Coralyn Mark, MD;  Location: [facility];  Service: Cardiovascular;  Laterality: N/A;  . VASECTOMY    . VENTRICULAR ABLATION SURGERY  06/01/2013   History   Social History  . Marital Status: Married    Spouse Name: N/A  . Number of Children: 2   Occupational History  . physician   Social History Main Topics  . Smoking status: Never Smoker   . Smokeless tobacco: Never Used  . Alcohol Use: 1.2 oz/week    1 Glasses scotch, rarely  . 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    Moved here from [facility] [place] 1.5 years ago   Current Outpatient Medications on  File Prior to Visit  Medication Sig Dispense Refill  . aspirin EC 325 MG tablet Take 1 tablet (325 mg total) 2 (two) times daily after a meal by mouth. Take x 1 month post op to decrease risk of blood clots. 60 tablet 0  . atorvastatin (LIPITOR) 40 MG tablet Take 1 tablet (40 mg total) by mouth daily. 90 tablet 3  . Blood Glucose Monitoring Suppl (BLOOD GLUCOSE MONITOR SYSTEM) w/Device KIT Glucose monitor to check blood sugars tid dx code E11.9 brand per insurance coverage 1 each 0  . canagliflozin (INVOKANA) 100 MG TABS tablet Take 1 tablet (100 mg total) by mouth daily before breakfast. 30 tablet 5  . carvedilol (COREG) 6.25 MG tablet Take 1 tablet (6.25 mg total) by mouth  2 (two) times daily. 180 tablet 3  . Cholecalciferol ([employer] VIT D 5000 HIGH-POTENCY PO) Take 10,000 Units by mouth daily.    Marland Kitchen docusate sodium (COLACE) 100 MG capsule Take 1 capsule (100 mg total) 2 (two) times daily by mouth. 30 capsule 0  . furosemide (LASIX) 40 MG tablet Take 40 mg by mouth daily.    Marland Kitchen glimepiride (AMARYL) 4 MG tablet Take 1 twice a day (Patient taking differently: Take 4 mg by mouth 2 (two) times daily with a meal. Take 1 twice a day) 180 tablet 3  . glucose blood (BAYER CONTOUR NEXT TEST) test strip USE TO CHECK BLOOD SUGARS THREE TIMES DAILY 100 each 12  . Insulin Glargine (LANTUS SOLOSTAR) 100 UNIT/ML Solostar Pen Inject 38 Units into the skin at bedtime. 5 pen 11  . Insulin Pen Needle (CLICKFINE PEN NEEDLES) 32G X 4 MM MISC Use 2x a day - with Lantus and Victoza. 200 each 11  . Lancets MISC Glucose monitor to check blood sugars tid dx code E11.9 brand per insurance coverage 100 each 11  . metFORMIN (GLUCOPHAGE-XR) 500 MG 24 hr tablet Take 4 tablets (2,000 mg total) by mouth daily with breakfast. 360 tablet 3  . sacubitril-valsartan (ENTRESTO) 49-51 MG Take 1 tablet by mouth 2 (two) times daily. 180 tablet 3  . spironolactone (ALDACTONE) 25 MG tablet Take 0.5 tablets (12.5 mg total) by mouth daily. 16  tablet 3  . tiZANidine (ZANAFLEX) 2 MG tablet Take 1 tablet (2 mg total) every 8 (eight) hours as needed by mouth for muscle spasms. 50 tablet 0  . VICTOZA 18 MG/3ML SOPN INJECT 1.8 MG INTO THE SKIN DAILY. (Patient taking differently: INJECT 1.8 MG INTO THE SKIN DAILY EACH MORNING) 2 pen 0   No current facility-administered medications on file prior to visit.    Allergies  Allergen Reactions  . Lisinopril     angioedema   Family History  Problem Relation Age of Onset  . Alcohol abuse Mother   . COPD Mother   . Depression Mother   . Heart disease Mother   . Hypertension Mother   . Stroke Mother   . COPD Father   . Heart disease Father   . Hypertension Father   . Stroke Father   . Cancer Father   . Diabetes Sister   . Mental retardation Sister   . Learning disabilities Sister   . Hypertension Sister   . Alcohol abuse Brother   . Drug abuse Brother   . Heart disease Brother   . Hypertension Brother   . Coronary artery disease Brother    PE: BP 104/74   Pulse 91   Ht '5\' 11"'  (1.803 m)   Wt 231 lb (104.8 kg)   SpO2 97%   BMI 32.22 kg/m  Body mass index is 32.22 kg/m. Wt Readings from Last 3 Encounters:  06/19/17 231 lb (104.8 kg)  04/28/17 241 lb (109.3 kg)  04/25/17 241 lb 6.4 oz (109.5 kg)   Constitutional: overweight, in NAD Eyes: PERRLA, EOMI, no exophthalmos ENT: moist mucous membranes, no thyromegaly, no cervical lymphadenopathy Cardiovascular: RRR, No MRG, + bilateral pitting edema Respiratory: CTA B Gastrointestinal: abdomen soft, NT, ND, BS+ Musculoskeletal: no deformities, strength intact in all 4 Skin: moist, warm, no rashes Neurological: no tremor with outstretched hands, DTR normal in all 4  ASSESSMENT: 1. DM2, non-insulin-dependent, uncontrolled, with complications - CKD - mild PN - ED  2. Hypogonadism  PLAN:  1. Patient with long-standing, uncontrolled, diabetes,  on oral antidiabetic regimen, GLP-1 receptor agonist, and basal insulin.  His  latest HbA1c obtained in 04/2017 has improved to 8%.  He has slightly higher sugars during the holidays and also due to a gastroenteritis, now improving.  He continues to adjust his Lantus dose.  He has no side effects to any of his medicines. - Reviewed his CBG log -no clear trends - We will not change his regimen for now - I suggested to:  Patient Instructions  Please continue: - Metformin XR 2000 mg at bedtime - Amaryl 4 mg 2x a day, before meals. - Invokana 100 mg in a.m. - Victoza 1.8 mg daily in am - Lantus 38 units at bedtime   Please return in 3 months with your sugar log.   - continue checking sugars at different times of the day - check 1-3x a day, rotating checks - advised for yearly eye exams >> he is UTD - Return to clinic in 3 mo with sugar log    2. Hypogonadism - He was previously on AndroGel, however, this was not approved in the last 2 years so he is now off testosterone supplementation.  Of note, testosterone levels in 01/2016 were normal off androgen  - At last visit, I ordered a new testosterone level, but he did not come back to have this checked.  Philemon Kingdom, MD PhD [facility]

## 2017-06-19 NOTE — Patient Instructions (Signed)
Please continue: - Metformin XR 2000 mg at bedtime - Amaryl 4 mg 2x a day, before meals. - Invokana 100 mg in a.m. - Victoza 1.8 mg daily in am - Lantus 38 units at bedtime   Please return in 3 months with your sugar log.

## 2017-06-24 ENCOUNTER — Encounter: Payer: Self-pay | Admitting: Family Medicine

## 2017-06-27 MED FILL — METFORMIN HCL ER 500 MG TAB: 500 | 30 days supply | Qty: 120 | Fill #4

## 2017-06-27 MED FILL — ENTRESTO 49 MG-51 MG TABLET: 49-51 | 30 days supply | Qty: 60 | Fill #4

## 2017-06-27 MED FILL — LANTUS SOLOSTAR 100 UNITS/M: 100 | 30 days supply | Qty: 12 | Fill #7

## 2017-06-27 MED FILL — GLIMEPIRIDE 4 MG TABLET: 4 | 30 days supply | Qty: 60 | Fill #4

## 2017-06-27 MED FILL — NABUMETONE 500 MG TABLET: 500 | 30 days supply | Qty: 60 | Fill #4

## 2017-06-27 MED FILL — TAMSULOSIN HCL 0.4 MG CAP: 0.4 | 30 days supply | Qty: 30 | Fill #2

## 2017-06-27 MED FILL — VICTOZA 18 MG/3 ML INJECT P: 18 | 30 days supply | Qty: 9 | Fill #9

## 2017-06-27 MED FILL — CARVEDILOL 6.25 MG TABLET: 6.25 | 30 days supply | Qty: 60 | Fill #4

## 2017-07-19 DIAGNOSIS — M67912 Unspecified disorder of synovium and tendon, left shoulder: Secondary | ICD-10-CM | POA: Diagnosis not present

## 2017-07-19 DIAGNOSIS — M1712 Unilateral primary osteoarthritis, left knee: Secondary | ICD-10-CM | POA: Diagnosis not present

## 2017-07-23 ENCOUNTER — Telehealth: Payer: Self-pay | Admitting: Internal Medicine

## 2017-07-23 DIAGNOSIS — Z794 Long term (current) use of insulin: Principal | ICD-10-CM

## 2017-07-23 DIAGNOSIS — E088 Diabetes mellitus due to underlying condition with unspecified complications: Secondary | ICD-10-CM

## 2017-07-23 MED ORDER — INSULIN PEN NEEDLE 32G X 4 MM MISC: 1 refills | Status: DC

## 2017-07-23 MED ORDER — LANCETS MISC: 1 refills | Status: DC

## 2017-07-23 MED ORDER — METFORMIN HCL ER 500 MG PO TB24: 2000.0000 mg | ORAL_TABLET | Freq: Every day | ORAL | 1 refills | Status: DC

## 2017-07-23 MED ORDER — CANAGLIFLOZIN 100 MG PO TABS: 100.0000 mg | ORAL_TABLET | Freq: Every day | ORAL | 1 refills | Status: DC

## 2017-07-23 MED ORDER — GLUCOSE BLOOD VI STRP: ORAL_STRIP | 1 refills | Status: DC

## 2017-07-23 MED ORDER — LIRAGLUTIDE 18 MG/3ML ~~LOC~~ SOPN: PEN_INJECTOR | SUBCUTANEOUS | 1 refills | Status: DC

## 2017-07-23 MED ORDER — GLIMEPIRIDE 4 MG PO TABS: ORAL_TABLET | ORAL | 1 refills | Status: DC

## 2017-07-23 NOTE — Telephone Encounter (Signed)
Sent medications.

## 2017-07-23 NOTE — Telephone Encounter (Signed)
metFORMIN (GLUCOPHAGE-XR) 500 MG 24 hr tablet   Lancets MISC  glimepiride (AMARYL) 4 MG tablet VICTOZA 18 MG/3ML SOPN canagliflozin (INVOKANA) 100 MG TABS tablet glucose blood (BAYER CONTOUR NEXT TEST) test strip Insulin Pen Needle (CLICKFINE PEN NEEDLES) 32G X 4 MM MISC  Pt stated that [facility] that we needed to send this in as a 90 days instead of 30.  [facility]  Phone- [phone] Fax [phone]

## 2017-07-24 ENCOUNTER — Other Ambulatory Visit (HOSPITAL_COMMUNITY): Payer: Self-pay | Admitting: *Deleted

## 2017-07-24 DIAGNOSIS — I420 Dilated cardiomyopathy: Secondary | ICD-10-CM

## 2017-07-24 MED ORDER — FUROSEMIDE 40 MG PO TABS: 40.0000 mg | ORAL_TABLET | Freq: Every day | ORAL | 3 refills | Status: DC

## 2017-07-24 MED ORDER — SACUBITRIL-VALSARTAN 49-51 MG PO TABS
1.0000 | ORAL_TABLET | Freq: Two times a day (BID) | ORAL | 3 refills | Status: DC
Start: 2017-07-24 — End: 2017-08-11

## 2017-07-24 MED ORDER — SPIRONOLACTONE 25 MG PO TABS: 12.5000 mg | ORAL_TABLET | Freq: Every day | ORAL | 3 refills | Status: DC

## 2017-07-24 MED ORDER — CARVEDILOL 6.25 MG PO TABS: 6.2500 mg | ORAL_TABLET | Freq: Two times a day (BID) | ORAL | 3 refills | Status: DC

## 2017-07-25 ENCOUNTER — Other Ambulatory Visit: Payer: Self-pay

## 2017-07-25 ENCOUNTER — Telehealth: Payer: Self-pay

## 2017-07-25 MED ORDER — INSULIN GLARGINE 100 UNIT/ML SOLOSTAR PEN: 38.0000 [IU] | PEN_INJECTOR | Freq: Every day | SUBCUTANEOUS | 4 refills | Status: DC

## 2017-07-25 NOTE — Telephone Encounter (Signed)
Patient called requesting Lantus be refilled- [facility] patient's request- just an [place]

## 2017-07-28 NOTE — Telephone Encounter (Signed)
OK to send Trulicity 1.5 mg weekly #12 pen with 3 refills

## 2017-07-28 NOTE — Telephone Encounter (Signed)
[facility] is calling in regards to a script we sent on 2/6 and have a few questions they need to speak to someone about.   This script is not covered by patients insurance. liraglutide (VICTOZA) 18 MG/3ML SOPN  [facility] following as being covered by insurance   Versie Starks    Please advise  [phone] REF # [id number]

## 2017-07-28 NOTE — Telephone Encounter (Signed)
Please advise on below  

## 2017-07-29 MED ORDER — DULAGLUTIDE 1.5 MG/0.5ML ~~LOC~~ SOAJ: 1.5000 mg | SUBCUTANEOUS | 3 refills | Status: DC

## 2017-07-29 NOTE — Telephone Encounter (Signed)
Sent and discontinued Victoza

## 2017-08-01 ENCOUNTER — Encounter (HOSPITAL_COMMUNITY): Payer: Self-pay | Admitting: Cardiology

## 2017-08-01 ENCOUNTER — Telehealth (HOSPITAL_COMMUNITY): Payer: Self-pay | Admitting: Pharmacist

## 2017-08-01 NOTE — Progress Notes (Signed)
Entresto PA approved by [facility] through 08/01/18.

## 2017-08-01 NOTE — Telephone Encounter (Signed)
Entresto PA approved by [facility] through 08/01/18.   Ruta Hinds. Velva Harman, PharmD, BCPS, CPP Clinical Pharmacist Phone: [phone] 08/01/2017 11:16 AM

## 2017-08-11 ENCOUNTER — Other Ambulatory Visit (HOSPITAL_COMMUNITY): Payer: Self-pay | Admitting: *Deleted

## 2017-08-11 DIAGNOSIS — I420 Dilated cardiomyopathy: Secondary | ICD-10-CM

## 2017-08-11 MED ORDER — CARVEDILOL 6.25 MG PO TABS: 6.2500 mg | ORAL_TABLET | Freq: Two times a day (BID) | ORAL | 3 refills | Status: DC

## 2017-08-11 MED ORDER — FUROSEMIDE 40 MG PO TABS: 40.0000 mg | ORAL_TABLET | Freq: Every day | ORAL | 3 refills | Status: DC

## 2017-08-11 MED ORDER — SPIRONOLACTONE 25 MG PO TABS: 12.5000 mg | ORAL_TABLET | Freq: Every day | ORAL | 3 refills | Status: DC

## 2017-08-11 MED ORDER — SACUBITRIL-VALSARTAN 49-51 MG PO TABS: 1.0000 | ORAL_TABLET | Freq: Two times a day (BID) | ORAL | 3 refills | Status: DC

## 2017-08-19 ENCOUNTER — Telehealth: Payer: Self-pay | Admitting: Internal Medicine

## 2017-08-19 ENCOUNTER — Other Ambulatory Visit: Payer: Self-pay

## 2017-08-19 MED ORDER — INSULIN GLARGINE 100 UNIT/ML SOLOSTAR PEN: 38.0000 [IU] | PEN_INJECTOR | Freq: Every day | SUBCUTANEOUS | 4 refills | Status: DC

## 2017-08-22 NOTE — Telephone Encounter (Signed)
error 

## 2017-08-25 ENCOUNTER — Telehealth: Payer: Self-pay | Admitting: Internal Medicine

## 2017-08-25 NOTE — Telephone Encounter (Signed)
Insulin Glargine (LANTUS SOLOSTAR) 100 UNIT/ML Solostar Pen  Patient stated his pharmacy has not received the prescription from our office. States it has been 3 weeks.  [place], [place]

## 2017-08-26 MED ORDER — INSULIN GLARGINE 100 UNIT/ML SOLOSTAR PEN: 38.0000 [IU] | PEN_INJECTOR | Freq: Every day | SUBCUTANEOUS | 4 refills | Status: DC

## 2017-08-26 NOTE — Telephone Encounter (Signed)
Spoke to patient. Advised Rx sent on 03/05. Pt states sent to wrong pharm. Advised redirected Rx to correct pharmacy on file. Patient verbalized understanding.

## 2017-09-02 ENCOUNTER — Other Ambulatory Visit: Payer: Self-pay

## 2017-09-02 MED ORDER — INSULIN GLARGINE 100 UNIT/ML SOLOSTAR PEN: 38.0000 [IU] | PEN_INJECTOR | Freq: Every day | SUBCUTANEOUS | 4 refills | Status: DC

## 2017-09-03 MED FILL — LANTUS SOLOSTAR 100 UNITS/M: 100 | 30 days supply | Qty: 12 | Fill #8

## 2017-09-12 ENCOUNTER — Encounter (HOSPITAL_COMMUNITY): Payer: BLUE CROSS/BLUE SHIELD | Admitting: Cardiology

## 2017-09-15 NOTE — Telephone Encounter (Signed)
There are no additional encounter notes.

## 2017-09-17 ENCOUNTER — Ambulatory Visit: Payer: BLUE CROSS/BLUE SHIELD | Admitting: Internal Medicine

## 2017-09-18 ENCOUNTER — Encounter (HOSPITAL_COMMUNITY): Payer: Self-pay | Admitting: Cardiology

## 2017-09-18 ENCOUNTER — Ambulatory Visit (HOSPITAL_COMMUNITY)
Admission: RE | Admit: 2017-09-18 | Discharge: 2017-09-18 | Disposition: A | Discharge status: Home / Self Care | Payer: BLUE CROSS/BLUE SHIELD | Source: Ambulatory Visit | Attending: Cardiology | Admitting: Cardiology

## 2017-09-18 ENCOUNTER — Other Ambulatory Visit: Payer: Self-pay

## 2017-09-18 VITALS — BP 118/62 | HR 77 | Wt 234.4 lb

## 2017-09-18 DIAGNOSIS — E291 Testicular hypofunction: Secondary | ICD-10-CM | POA: Diagnosis not present

## 2017-09-18 DIAGNOSIS — Z86718 Personal history of other venous thrombosis and embolism: Secondary | ICD-10-CM | POA: Diagnosis not present

## 2017-09-18 DIAGNOSIS — I2510 Atherosclerotic heart disease of native coronary artery without angina pectoris: Secondary | ICD-10-CM | POA: Insufficient documentation

## 2017-09-18 DIAGNOSIS — Z79899 Other long term (current) drug therapy: Secondary | ICD-10-CM | POA: Diagnosis not present

## 2017-09-18 DIAGNOSIS — G63 Polyneuropathy in diseases classified elsewhere: Secondary | ICD-10-CM | POA: Diagnosis not present

## 2017-09-18 DIAGNOSIS — K219 Gastro-esophageal reflux disease without esophagitis: Secondary | ICD-10-CM | POA: Insufficient documentation

## 2017-09-18 DIAGNOSIS — I493 Ventricular premature depolarization: Secondary | ICD-10-CM | POA: Diagnosis not present

## 2017-09-18 DIAGNOSIS — E1122 Type 2 diabetes mellitus with diabetic chronic kidney disease: Secondary | ICD-10-CM | POA: Insufficient documentation

## 2017-09-18 DIAGNOSIS — Z794 Long term (current) use of insulin: Secondary | ICD-10-CM | POA: Insufficient documentation

## 2017-09-18 DIAGNOSIS — I5022 Chronic systolic (congestive) heart failure: Secondary | ICD-10-CM | POA: Insufficient documentation

## 2017-09-18 DIAGNOSIS — Z7982 Long term (current) use of aspirin: Secondary | ICD-10-CM | POA: Insufficient documentation

## 2017-09-18 DIAGNOSIS — I130 Hypertensive heart and chronic kidney disease with heart failure and stage 1 through stage 4 chronic kidney disease, or unspecified chronic kidney disease: Secondary | ICD-10-CM | POA: Diagnosis not present

## 2017-09-18 DIAGNOSIS — N183 Chronic kidney disease, stage 3 (moderate): Secondary | ICD-10-CM | POA: Diagnosis not present

## 2017-09-18 DIAGNOSIS — I428 Other cardiomyopathies: Secondary | ICD-10-CM | POA: Diagnosis not present

## 2017-09-18 DIAGNOSIS — N400 Enlarged prostate without lower urinary tract symptoms: Secondary | ICD-10-CM | POA: Diagnosis not present

## 2017-09-18 DIAGNOSIS — E785 Hyperlipidemia, unspecified: Secondary | ICD-10-CM | POA: Diagnosis not present

## 2017-09-18 DIAGNOSIS — Z9889 Other specified postprocedural states: Secondary | ICD-10-CM | POA: Diagnosis not present

## 2017-09-18 DIAGNOSIS — G4733 Obstructive sleep apnea (adult) (pediatric): Secondary | ICD-10-CM | POA: Insufficient documentation

## 2017-09-18 DIAGNOSIS — I472 Ventricular tachycardia: Secondary | ICD-10-CM | POA: Diagnosis not present

## 2017-09-18 DIAGNOSIS — E782 Mixed hyperlipidemia: Secondary | ICD-10-CM | POA: Diagnosis not present

## 2017-09-18 DIAGNOSIS — I10 Essential (primary) hypertension: Secondary | ICD-10-CM | POA: Diagnosis not present

## 2017-09-18 DIAGNOSIS — M1990 Unspecified osteoarthritis, unspecified site: Secondary | ICD-10-CM | POA: Diagnosis not present

## 2017-09-18 DIAGNOSIS — Z1211 Encounter for screening for malignant neoplasm of colon: Secondary | ICD-10-CM | POA: Diagnosis not present

## 2017-09-18 DIAGNOSIS — I429 Cardiomyopathy, unspecified: Secondary | ICD-10-CM | POA: Diagnosis not present

## 2017-09-18 LAB — BASIC METABOLIC PANEL
Anion gap: 13 (ref 5–15)
BUN: 36 mg/dL — AB (ref 6–20)
CHLORIDE: 97 mmol/L — AB (ref 101–111)
CO2: 24 mmol/L (ref 22–32)
CREATININE: 1.56 mg/dL — AB (ref 0.61–1.24)
Calcium: 8.7 mg/dL — ABNORMAL LOW (ref 8.9–10.3)
GFR calc Af Amer: 50 mL/min — ABNORMAL LOW (ref >60)
GFR calc non Af Amer: 43 mL/min — ABNORMAL LOW (ref >60)
Glucose, Bld: 281 mg/dL — ABNORMAL HIGH (ref 65–99)
Potassium: 4.3 mmol/L (ref 3.5–5.1)
SODIUM: 134 mmol/L — AB (ref 135–145)

## 2017-09-18 LAB — LIPID PANEL
CHOLESTEROL: 181 mg/dL (ref 0–200)
HDL: 29 mg/dL — ABNORMAL LOW (ref >40)
LDL CALC: 89 mg/dL (ref 0–99)
Total CHOL/HDL Ratio: 6.2 RATIO
Triglycerides: 313 mg/dL — ABNORMAL HIGH (ref <150)
VLDL: 63 mg/dL — AB (ref 0–40)

## 2017-09-18 MED ORDER — SPIRONOLACTONE 25 MG PO TABS: 25.0000 mg | ORAL_TABLET | Freq: Every day | ORAL | 3 refills | Status: DC

## 2017-09-18 MED ORDER — ROSUVASTATIN CALCIUM 10 MG PO TABS: 10.0000 mg | ORAL_TABLET | Freq: Every day | ORAL | 3 refills | Status: DC

## 2017-09-18 MED FILL — ROSUVASTATIN CALCIUM 10 MG: 10 | 30 days supply | Qty: 30 | Fill #0

## 2017-09-18 MED FILL — FINASTERIDE 5 MG TABLET: 5 | 30 days supply | Qty: 30 | Fill #0

## 2017-09-18 MED FILL — NABUMETONE 500 MG TABLET: 500 | 30 days supply | Qty: 60 | Fill #5

## 2017-09-18 NOTE — Patient Instructions (Addendum)
Start Crestor 10 mg (1 tab) daily  Increase spironolactone 25 mg (1 tab) daily  Your physician has recommended that you wear a holter monitor. Holter monitors are medical devices that record the heart's electrical activity. Doctors most often use these monitors to diagnose arrhythmias. Arrhythmias are problems with the speed or rhythm of the heartbeat. The monitor is a small, portable device. You can wear one while you do your normal daily activities. This is usually used to diagnose what is causing palpitations/syncope (passing out).  Labs drawn today (if we do not call you, then your lab work was stable)   Your physician recommends that you return for lab work in: 2 months Rx given to have done at PCP   Your physician recommends that you schedule a follow-up appointment in: 6 months (October, 2019) Please Call an Schedule Appointment

## 2017-09-18 NOTE — Progress Notes (Signed)
Patient ID: BION TODOROV, male   DOB: 15-Jul-1945, 72 y.o.   MRN: [id number] PCP: Dr. Marzetta Board EP: Dr. Caryl Comes Cardiology: Dr. Aundra Dubin  72 y.o. 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 Patient has had a cardiomyopathy known since around 2011.  Cath at that time showed nonobstructive CAD but EF 25%.  He got a Medtronic ICD.  Initial workup was in [place], and he subsequently moved to [place].  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    He has been noted to have frequent PVCs, at one point up to 1/3 of beats.  VT ablation did not help much, but mexiletine seemed to cut back on PVC frequency.  He is no longer taking mexiletine. Dr Ouida Sills felt like his PVCs increased again.  He was seen at [facility] for possible VT ablation.  During that visit, his ICD was interrogated and PVCs were noted to be up to > 1000/hr with short runs NSVT.  Echo was repeated in 1/16 and showed EF down to 30-35%.  Also of note, patient had an untriggered DVT in 12/15 and was on Xarelto for a time but is no longer taking it. He was diagnosed with OSA and is on CPAP.   He had VT mapping at [facility] in 4/16, mapped to inferior septum.  He had LHC the next day, showing 90% distal LAD and 90% PDA.  He had DES to both lesions.  He was sent home on ASA 325, Plavix 75, and Xarelto 20.  Plan was to return 1 year after DES placement to have epicardial VT ablation (off Plavix).    In 6/17, he had unsuccessful epicardial/endocardial VT ablation at [facility]. Suspect mid-myocardial focus.   Echo in 10/18 showed EF 40-45%.    He returns today for followup of CHF.  He saw Dr. Caryl Comes with ICD nearing ERI.  He is leaning towards not replacing the generator.   He does not feel palpitations.  No exertional dyspnea, he is generally active and still works full time at an urgent care.  No chest pain.  No orthopnea/PND.  No bendopnea.  He is no longer taking atorvastatin due  to leg cramps.  He does some walking for exercise.  Sees Dr. Cruzita Lederer for endocrinology.   Medtronic device interrogation: No VT, stable impedance.   Labs (10/15): K 4.7, creatinine 1.6, LDL 84, HDL 35 Labs (12/15): K 4.8, creatinine 1.23 Labs (2/16): K 4.5, creatinine 1.4 Labs (8/17): LDL 103 Labs (9/18): LDL 87, HDL 30, TGs 218, K 4.1, creatinine 1.37, hgb 14 Labs (11/18): hgb 11.5, K 4.8, creatinine 1.48  PMH: 1. Nonischemic cardiomyopathy: Diagnosed in [place] in 2011.  Possibly related to frequent PVCs. Medtronic ICD placed in 2011.  Echo (6/14) with EF 35%.  Echo (10/15) with EF 40-45% (difficult with PVCs), mildly dilated RV with moderately decreased RV systolic function.  Echo (1/16) with EF 30-35%, mildly dilated LV.   - Echo (10/18): EF 40-45%, normal RV size and systolic function.  2. PVCs: Frequent, at one point up to 1/3 of total beats.  Patient had endocardial ablation in 6/14 that was not successful.  However, PVC count dropped with initiation of mexiletine.  He is now off mexiletine.  - Combined epicardial/endocardial ablation 6/17 at [facility] was unsuccessful, likely due to mid-myocardial focus.  3. HTN 4. Type II diabetes 5. CAD:  LHC (1/11) with 40-50% mLAD stenosis, 90% small OM1 stenosis, EF 25%.  LHC (4/16) witih 90% dLAD and 90% PDA, both treated with DES.  6. GERD 7. Appendectomy 8. Lumbar laminectomy 9. CKD 10. Hyperlipidemia: Myalgias with Crestor.  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  12. 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  FH: Father with MI in his 70s and CVA (smoker), mother with PAD diagnosed in her 45s.   ROS: All systems reviewed and negative except as per HPI.   Current Outpatient Medications  Medication Sig Dispense Refill  . Blood Glucose Monitoring Suppl (BLOOD GLUCOSE MONITOR SYSTEM) w/Device KIT Glucose monitor to check blood sugars tid dx code E11.9 brand per insurance coverage  1 each 0  . canagliflozin (INVOKANA) 100 MG TABS tablet Take 1 tablet (100 mg total) by mouth daily before breakfast. 90 tablet 1  . carvedilol (COREG) 6.25 MG tablet Take 1 tablet (6.25 mg total) by mouth 2 (two) times daily. 180 tablet 3  . Dulaglutide (TRULICITY) 1.5 MO/2.9UT SOPN Inject 1.5 mg into the skin once a week. 12 pen 3  . furosemide (LASIX) 40 MG tablet Take 1 tablet (40 mg total) by mouth daily. 90 tablet 3  . glimepiride (AMARYL) 4 MG tablet Take 1 twice a day 180 tablet 1  . glucose blood (BAYER CONTOUR NEXT TEST) test strip USE TO CHECK BLOOD SUGARS THREE TIMES DAILY 300 each 1  . Insulin Glargine (LANTUS SOLOSTAR) 100 UNIT/ML Solostar Pen Inject 38 Units into the skin at bedtime. 45 mL 4  . Insulin Pen Needle (CLICKFINE PEN NEEDLES) 32G X 4 MM MISC Use 2x a day - with Lantus and Victoza. 200 each 1  . Lancets MISC Glucose monitor to check blood sugars tid dx code E11.9 brand per insurance coverage 300 each 1  . metFORMIN (GLUCOPHAGE-XR) 500 MG 24 hr tablet Take 4 tablets (2,000 mg total) by mouth daily with breakfast. 360 tablet 1  . sacubitril-valsartan (ENTRESTO) 49-51 MG Take 1 tablet by mouth 2 (two) times daily. 180 tablet 3  . spironolactone (ALDACTONE) 25 MG tablet Take 1 tablet (25 mg total) by mouth daily. 30 tablet 3  . aspirin EC 325 MG tablet Take 1 tablet (325 mg total) 2 (two) times daily after a meal by mouth. Take x 1 month post op to decrease risk of blood clots. 60 tablet 0  . rosuvastatin (CRESTOR) 10 MG tablet Take 1 tablet (10 mg total) by mouth daily. 30 tablet 3   No current facility-administered medications for this encounter.    BP 118/62   Pulse 77   Wt 234 lb 6.4 oz (106.3 kg)   SpO2 97%   BMI 32.69 kg/m  General: NAD Neck: No JVD, no thyromegaly or thyroid nodule.  Lungs: Clear to auscultation bilaterally with normal respiratory effort. CV: Nondisplaced PMI.  Heart regular S1/S2, no S3/S4, no murmur.  No peripheral edema.  No carotid bruit.   Normal pedal pulses.  Abdomen: Soft, nontender, no hepatosplenomegaly, no distention.  Skin: Intact without lesions or rashes.  Neurologic: Alert and oriented x 3.  Psych: Normal affect. Extremities: No clubbing or cyanosis.  HEENT: Normal.   Assessment/Plan:  1. Chronic systolic CHF: Nonischemic cardiomyopathy.  Possibly PVC-related as there seemed to have been some improvement in EF with decreasing PVC burden on mexiletine (EF 40-45%).  With more PVCs off mexiletine, EF down to 30-35% on last echo in 1/16.  He had PCI in 4/16 to distal LAD and PDA, but suspect that his cardiomyopathy is  not predominantly ischemic (pre-dated coronary disease and out of proportion to coronary disease).  Most recently in 10/18, EF back to 40-45% by echo.     - Continue current Coreg and Entresto. - Increase spironolactone to 25 mg daily with BMET today and again in 10 days.   - Continue current Lasix.  2. 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 Epicardial/endocardial ablation in 6/17 at [facility] was unsuccessful, likely due to mid-myocardial focus.  He is trying to decide on whether or not to replace ICD generator.  He is leaning against it.  - I will get Holter to assess PVC amount and to look for NSVT.  If he has a lot of NSVT, would recommend replacing generator.  3. CKD:  Stage 3.  BMET after spironolactone increased.    4. CAD: Nonobstructive CAD on 2011 cath but 4/16 cath at [facility] with distal LAD and PDA stenoses treated with DES.  As above, do not think coronary disease can explain extent of his cardiomyopathy.  He is currently on ASA 81 daily but stopped atorvastatin due to myalgias. - Continue ASA 81.   - He will try Crestor 10 mg daily with lipids/LFTs in 2 months. If he cannot tolerate Crestor, would use Repatha.  5. DVT: Spontaneous (no trigger).  He is now off Xarelto.   Followup in 6 months.   Loralie Champagne 09/18/2017

## 2017-09-19 ENCOUNTER — Encounter (HOSPITAL_COMMUNITY): Payer: Self-pay

## 2017-10-23 ENCOUNTER — Telehealth (HOSPITAL_COMMUNITY): Payer: Self-pay

## 2017-10-23 NOTE — Telephone Encounter (Signed)
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 Wanted something around 7:00 am. Explain that the schedule started @ 8:30 .  He is going to call back when his schedule free up/saf  ----- Message -----  From: Jonn Shingles A  Sent: 09/18/2017 10:20 AM  To: [facility]  Subject: 24 hour holter                  Please call pt  Thanks  Arbutus Leas

## 2017-10-31 MED FILL — GLIMEPIRIDE 4 MG TABLET: 4 | 30 days supply | Qty: 60 | Fill #5

## 2017-10-31 MED FILL — FINASTERIDE 5 MG TABLET: 5 | 30 days supply | Qty: 30 | Fill #1

## 2017-10-31 MED FILL — METFORMIN HCL ER 500 MG TAB: 500 | 30 days supply | Qty: 120 | Fill #5

## 2017-11-25 MED FILL — SPIRONOLACTONE 25 MG TABLET: 25 | 30 days supply | Qty: 30 | Fill #0

## 2017-12-10 MED FILL — METFORMIN HCL ER 500 MG TAB: 500 | 90 days supply | Qty: 360 | Fill #0

## 2017-12-10 MED FILL — GLIMEPIRIDE 4 MG TABLET: 4 | 90 days supply | Qty: 180 | Fill #0

## 2017-12-10 MED FILL — FINASTERIDE 5 MG TABLET: 5 | 30 days supply | Qty: 30 | Fill #2

## 2017-12-22 ENCOUNTER — Ambulatory Visit (INDEPENDENT_AMBULATORY_CARE_PROVIDER_SITE_OTHER): Payer: BLUE CROSS/BLUE SHIELD

## 2017-12-22 DIAGNOSIS — E119 Type 2 diabetes mellitus without complications: Secondary | ICD-10-CM | POA: Diagnosis not present

## 2017-12-22 DIAGNOSIS — H4911 Fourth [trochlear] nerve palsy, right eye: Secondary | ICD-10-CM | POA: Diagnosis not present

## 2017-12-22 DIAGNOSIS — H04123 Dry eye syndrome of bilateral lacrimal glands: Secondary | ICD-10-CM | POA: Diagnosis not present

## 2017-12-22 DIAGNOSIS — H43813 Vitreous degeneration, bilateral: Secondary | ICD-10-CM | POA: Diagnosis not present

## 2017-12-22 DIAGNOSIS — Z961 Presence of intraocular lens: Secondary | ICD-10-CM | POA: Diagnosis not present

## 2017-12-22 DIAGNOSIS — H02834 Dermatochalasis of left upper eyelid: Secondary | ICD-10-CM | POA: Diagnosis not present

## 2017-12-22 DIAGNOSIS — H33312 Horseshoe tear of retina without detachment, left eye: Secondary | ICD-10-CM | POA: Diagnosis not present

## 2017-12-22 DIAGNOSIS — H02831 Dermatochalasis of right upper eyelid: Secondary | ICD-10-CM | POA: Diagnosis not present

## 2017-12-22 DIAGNOSIS — I493 Ventricular premature depolarization: Secondary | ICD-10-CM

## 2017-12-29 ENCOUNTER — Telehealth (HOSPITAL_COMMUNITY): Payer: Self-pay

## 2017-12-29 DIAGNOSIS — I493 Ventricular premature depolarization: Secondary | ICD-10-CM

## 2017-12-29 MED FILL — SPIRONOLACTONE 25 MG TABS: 25 | 30 days supply | Qty: 30 | Fill #0

## 2017-12-29 NOTE — Telephone Encounter (Signed)
Notes recorded by Shirley Muscat, RN on 12/29/2017 at 10:06 AM EDT Pt aware of results, ref placed  ------  Notes recorded by Larey Dresser, MD on 12/28/2017 at 9:59 PM EDT Still a lot of PVCs, 15% of total. No VT/NSVT. I would like him to see Dr. Caryl Comes again for evaluation, with all the PVCs would favor replacing ICD generator.

## 2018-01-01 NOTE — Telephone Encounter (Signed)
Pt left VM requesting a call directly from [facility] to discuss results. 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   Message routed to [place]

## 2018-01-07 NOTE — Telephone Encounter (Signed)
I spoke with patient, think he wants to hold off on generator change.  However, given that he still has 15% PVCs, we discussed having him followup with Dr. Caryl Comes (has not seen in about a year).  I think starting mexiletine would be reasonable to decrease PVC percentage.

## 2018-01-16 NOTE — Telephone Encounter (Signed)
mex is reasonable

## 2018-01-23 MED FILL — FINASTERIDE 5 MG TABLET: 5 | 30 days supply | Qty: 30 | Fill #3

## 2018-01-23 MED FILL — LANTUS SOLOSTAR 100 UNITS/M: 100 | 7 days supply | Qty: 3 | Fill #0

## 2018-01-23 MED FILL — CARVEDILOL 6.25 MG TABS: 6.25 | 30 days supply | Qty: 60 | Fill #5

## 2018-01-23 MED FILL — SPIRONOLACTONE 25 MG TABS: 25 | 30 days supply | Qty: 30 | Fill #1

## 2018-01-26 ENCOUNTER — Institutional Professional Consult (permissible substitution): Payer: BLUE CROSS/BLUE SHIELD | Admitting: Cardiology

## 2018-02-17 MED FILL — LANTUS SOLOSTAR 100 UNITS/M: 100 | 30 days supply | Qty: 12 | Fill #0

## 2018-02-25 MED FILL — SPIRONOLACTONE 25 MG TABS: 25 | 30 days supply | Qty: 30 | Fill #2

## 2018-02-25 MED FILL — CARVEDILOL 6.25 MG TABS: 6.25 | 30 days supply | Qty: 60 | Fill #6

## 2018-02-25 MED FILL — FINASTERIDE 5 MG TABLET: 5 | 30 days supply | Qty: 30 | Fill #4

## 2018-03-19 ENCOUNTER — Telehealth: Payer: Self-pay | Admitting: Internal Medicine

## 2018-03-19 MED ORDER — DULAGLUTIDE 1.5 MG/0.5ML ~~LOC~~ SOAJ: 1.5000 mg | SUBCUTANEOUS | 3 refills | Status: DC

## 2018-03-19 MED FILL — METFORMIN HCL ER 500 MG TAB: 500 | 90 days supply | Qty: 360 | Fill #1

## 2018-03-19 MED FILL — LANTUS SOLOSTAR 100 UNITS/M: 100 | 30 days supply | Qty: 12 | Fill #1

## 2018-03-19 MED FILL — TRULICITY 1.5 MG/0.5 ML PEN: 1.5 | 28 days supply | Qty: 2 | Fill #0

## 2018-03-19 MED FILL — GLIMEPIRIDE 4 MG TABLET: 4 | 90 days supply | Qty: 180 | Fill #1

## 2018-03-19 NOTE — Telephone Encounter (Signed)
Pharmacy is calling stating that the pt is at the pharmacy needing a refill for Dulaglutide (TRULICITY)  Pharm:  [place]

## 2018-03-19 NOTE — Telephone Encounter (Signed)
RX sent

## 2018-03-31 ENCOUNTER — Other Ambulatory Visit (HOSPITAL_COMMUNITY): Payer: Self-pay | Admitting: Cardiology

## 2018-03-31 MED FILL — FUROSEMIDE 40 MG TAB: 40 | 30 days supply | Qty: 30 | Fill #0

## 2018-03-31 MED FILL — SPIRONOLACTONE 25 MG TABS: 25 | 30 days supply | Qty: 30 | Fill #3

## 2018-03-31 MED FILL — FINASTERIDE 5 MG TABLET: 5 | 30 days supply | Qty: 30 | Fill #5

## 2018-03-31 MED FILL — CARVEDILOL 6.25 MG TABLET: 6.25 | 30 days supply | Qty: 60 | Fill #0

## 2018-04-17 MED FILL — TRULICITY 1.5 MG/0.5 ML PEN: 1.5 | 28 days supply | Qty: 2 | Fill #1

## 2018-04-17 MED FILL — LANTUS SOLOSTAR 100 UNITS/M: 100 | 30 days supply | Qty: 12 | Fill #2

## 2018-05-01 ENCOUNTER — Other Ambulatory Visit (HOSPITAL_COMMUNITY): Payer: Self-pay | Admitting: Cardiology

## 2018-05-01 MED FILL — SPIRONOLACTONE 25 MG TABS: 25 | 30 days supply | Qty: 30 | Fill #0

## 2018-05-01 MED FILL — FINASTERIDE 5 MG TABLET: 5 | 30 days supply | Qty: 30 | Fill #6

## 2018-05-01 MED FILL — FUROSEMIDE 40 MG TAB: 40 | 30 days supply | Qty: 30 | Fill #1

## 2018-05-01 MED FILL — CARVEDILOL 6.25 MG TABLET: 6.25 | 30 days supply | Qty: 60 | Fill #1

## 2018-05-21 ENCOUNTER — Encounter (INDEPENDENT_AMBULATORY_CARE_PROVIDER_SITE_OTHER): Payer: BLUE CROSS/BLUE SHIELD | Admitting: Ophthalmology

## 2018-05-29 MED FILL — CARVEDILOL 6.25 MG TABLET: 6.25 | 30 days supply | Qty: 60 | Fill #2

## 2018-05-29 MED FILL — TRULICITY 1.5 MG/0.5 ML PEN: 1.5 | 28 days supply | Qty: 2 | Fill #2

## 2018-05-29 MED FILL — LANTUS SOLOSTAR 100 UNITS/M: 100 | 30 days supply | Qty: 12 | Fill #3

## 2018-05-29 MED FILL — FUROSEMIDE 40 MG TAB: 40 | 30 days supply | Qty: 30 | Fill #2

## 2018-05-29 MED FILL — SPIRONOLACTONE 25 MG TABS: 25 | 30 days supply | Qty: 30 | Fill #1

## 2018-06-02 ENCOUNTER — Other Ambulatory Visit: Payer: Self-pay | Admitting: Internal Medicine

## 2018-06-19 MED FILL — GLIMEPIRIDE 4 MG TABLET: 4 | 90 days supply | Qty: 180 | Fill #2

## 2018-06-29 MED FILL — TRULICITY 1.5 MG/0.5 ML PEN: 1.5 | 28 days supply | Qty: 2 | Fill #3

## 2018-06-29 MED FILL — FUROSEMIDE 40 MG TAB: 40 | 30 days supply | Qty: 30 | Fill #3

## 2018-06-29 MED FILL — LANTUS SOLOSTAR 100 UNITS/M: 100 | 30 days supply | Qty: 12 | Fill #0

## 2018-06-29 MED FILL — SPIRONOLACTONE 25 MG TABS: 25 | 30 days supply | Qty: 30 | Fill #2

## 2018-06-29 MED FILL — metFORMIN HCL ER 500 MG TB2: 500 | 30 days supply | Qty: 120 | Fill #2

## 2018-06-29 MED FILL — CARVEDILOL 6.25 MG TABLET: 6.25 | 30 days supply | Qty: 60 | Fill #3

## 2018-07-20 ENCOUNTER — Encounter (INDEPENDENT_AMBULATORY_CARE_PROVIDER_SITE_OTHER): Payer: BLUE CROSS/BLUE SHIELD | Admitting: Ophthalmology

## 2018-07-21 ENCOUNTER — Encounter (INDEPENDENT_AMBULATORY_CARE_PROVIDER_SITE_OTHER): Payer: BLUE CROSS/BLUE SHIELD | Admitting: Ophthalmology

## 2018-07-21 DIAGNOSIS — H43813 Vitreous degeneration, bilateral: Secondary | ICD-10-CM

## 2018-07-21 DIAGNOSIS — H33302 Unspecified retinal break, left eye: Secondary | ICD-10-CM | POA: Diagnosis not present

## 2018-07-21 DIAGNOSIS — E11319 Type 2 diabetes mellitus with unspecified diabetic retinopathy without macular edema: Secondary | ICD-10-CM | POA: Diagnosis not present

## 2018-07-21 DIAGNOSIS — I10 Essential (primary) hypertension: Secondary | ICD-10-CM

## 2018-07-21 DIAGNOSIS — E113293 Type 2 diabetes mellitus with mild nonproliferative diabetic retinopathy without macular edema, bilateral: Secondary | ICD-10-CM

## 2018-07-21 DIAGNOSIS — H35033 Hypertensive retinopathy, bilateral: Secondary | ICD-10-CM

## 2018-07-23 LAB — HEMOGLOBIN A1C: Hemoglobin A1C: 7.3

## 2018-07-25 ENCOUNTER — Other Ambulatory Visit (HOSPITAL_COMMUNITY): Payer: Self-pay | Admitting: Cardiology

## 2018-07-25 DIAGNOSIS — I420 Dilated cardiomyopathy: Secondary | ICD-10-CM

## 2018-07-27 ENCOUNTER — Encounter (HOSPITAL_COMMUNITY): Payer: Self-pay | Admitting: Cardiology

## 2018-07-27 ENCOUNTER — Ambulatory Visit (HOSPITAL_COMMUNITY)
Admission: RE | Admit: 2018-07-27 | Discharge: 2018-07-27 | Disposition: A | Discharge status: Home / Self Care | Payer: BLUE CROSS/BLUE SHIELD | Source: Ambulatory Visit | Attending: Cardiology | Admitting: Cardiology

## 2018-07-27 VITALS — BP 116/80 | HR 65 | Wt 229.2 lb

## 2018-07-27 DIAGNOSIS — I420 Dilated cardiomyopathy: Secondary | ICD-10-CM

## 2018-07-27 DIAGNOSIS — Z79899 Other long term (current) drug therapy: Secondary | ICD-10-CM | POA: Insufficient documentation

## 2018-07-27 DIAGNOSIS — Z7982 Long term (current) use of aspirin: Secondary | ICD-10-CM | POA: Insufficient documentation

## 2018-07-27 DIAGNOSIS — Z8249 Family history of ischemic heart disease and other diseases of the circulatory system: Secondary | ICD-10-CM | POA: Insufficient documentation

## 2018-07-27 DIAGNOSIS — M791 Myalgia, unspecified site: Secondary | ICD-10-CM | POA: Diagnosis not present

## 2018-07-27 DIAGNOSIS — I493 Ventricular premature depolarization: Secondary | ICD-10-CM

## 2018-07-27 DIAGNOSIS — I4510 Unspecified right bundle-branch block: Secondary | ICD-10-CM | POA: Insufficient documentation

## 2018-07-27 DIAGNOSIS — N183 Chronic kidney disease, stage 3 (moderate): Secondary | ICD-10-CM | POA: Diagnosis not present

## 2018-07-27 DIAGNOSIS — E1122 Type 2 diabetes mellitus with diabetic chronic kidney disease: Secondary | ICD-10-CM | POA: Diagnosis not present

## 2018-07-27 DIAGNOSIS — E785 Hyperlipidemia, unspecified: Secondary | ICD-10-CM | POA: Insufficient documentation

## 2018-07-27 DIAGNOSIS — G4733 Obstructive sleep apnea (adult) (pediatric): Secondary | ICD-10-CM | POA: Diagnosis not present

## 2018-07-27 DIAGNOSIS — Z95810 Presence of automatic (implantable) cardiac defibrillator: Secondary | ICD-10-CM | POA: Diagnosis not present

## 2018-07-27 DIAGNOSIS — Z955 Presence of coronary angioplasty implant and graft: Secondary | ICD-10-CM | POA: Diagnosis not present

## 2018-07-27 DIAGNOSIS — I130 Hypertensive heart and chronic kidney disease with heart failure and stage 1 through stage 4 chronic kidney disease, or unspecified chronic kidney disease: Secondary | ICD-10-CM | POA: Diagnosis not present

## 2018-07-27 DIAGNOSIS — I428 Other cardiomyopathies: Secondary | ICD-10-CM | POA: Insufficient documentation

## 2018-07-27 DIAGNOSIS — Z794 Long term (current) use of insulin: Secondary | ICD-10-CM | POA: Insufficient documentation

## 2018-07-27 DIAGNOSIS — Z7901 Long term (current) use of anticoagulants: Secondary | ICD-10-CM | POA: Diagnosis not present

## 2018-07-27 DIAGNOSIS — I2510 Atherosclerotic heart disease of native coronary artery without angina pectoris: Secondary | ICD-10-CM | POA: Insufficient documentation

## 2018-07-27 DIAGNOSIS — Z86718 Personal history of other venous thrombosis and embolism: Secondary | ICD-10-CM | POA: Diagnosis not present

## 2018-07-27 DIAGNOSIS — I5022 Chronic systolic (congestive) heart failure: Secondary | ICD-10-CM | POA: Insufficient documentation

## 2018-07-27 DIAGNOSIS — I491 Atrial premature depolarization: Secondary | ICD-10-CM | POA: Insufficient documentation

## 2018-07-27 DIAGNOSIS — R9431 Abnormal electrocardiogram [ECG] [EKG]: Secondary | ICD-10-CM | POA: Insufficient documentation

## 2018-07-27 MED ORDER — CARVEDILOL 6.25 MG PO TABS: 9.3750 mg | ORAL_TABLET | Freq: Two times a day (BID) | ORAL | 3 refills | Status: DC

## 2018-07-27 MED FILL — CARVEDILOL 6.25 MG TABLET: 6.25 | 30 days supply | Qty: 90 | Fill #0

## 2018-07-27 NOTE — Patient Instructions (Addendum)
EKG done today  INCREASE Carvedilol 9.375mg  (1.5 tab) twice daily  Your physician has requested that you have an echocardiogram. Echocardiography is a painless test that uses sound waves to create images of your heart. It provides your doctor with information about the size and shape of your heart and how well your heart's chambers and valves are working. This procedure takes approximately one hour. There are no restrictions for this procedure.  Please follow up with Dr. Caryl Comes with Electrophysiology in order to set up an appointment.  Follow up with Dr. Cruzita Lederer in order to discuss potential medicine change from glimepiride to [place] or Jardiance.  Follow up with Dr. Aundra Dubin in 3 months

## 2018-07-27 NOTE — Progress Notes (Signed)
Patient ID: Paul [name], male   DOB: [birth year], 73 y.o.   MRN: [id number] PCP: Dr. Marzetta Board EP: Dr. Caryl Comes Cardiology: Dr. Aundra Dubin  73 y.o. 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 Patient has had a cardiomyopathy known since around 2011.  Cath at that time showed nonobstructive CAD but EF 25%.  He got a Medtronic ICD.  Initial workup was in [place], and he subsequently moved to [place].  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    He has been noted to have frequent PVCs, at one point up to 1/3 of beats.  VT ablation did not help much, but mexiletine seemed to cut back on PVC frequency.  He is no longer taking mexiletine. Dr Ouida Sills felt like his PVCs increased again.  He was seen at [facility] for possible VT ablation.  During that visit, his ICD was interrogated and PVCs were noted to be up to > 1000/hr with short runs NSVT.  Echo was repeated in 1/16 and showed EF down to 30-35%.  Also of note, patient had an untriggered DVT in 12/15 and was on Xarelto for a time but is no longer taking it. He was diagnosed with OSA and is on CPAP.   He had VT mapping at [facility] in 4/16, mapped to inferior septum.  He had LHC the next day, showing 90% distal LAD and 90% PDA.  He had DES to both lesions.  He was sent home on ASA 325, Plavix 75, and Xarelto 20.  Plan was to return 1 year after DES placement to have epicardial VT ablation (off Plavix).    In 6/17, he had unsuccessful epicardial/endocardial VT ablation at [facility]. Suspect mid-myocardial focus.   Echo in 10/18 showed EF 40-45%.    He returns today for followup of CHF.  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  Generally feels good, mild dyspnea walking his dog up a hill but no dyspnea on flat ground. No chest pain. No orthopnea/PND.  Occasionally feels palpitations.  Of note, he takes occasional Naproxen for joint pain.   Medtronic device interrogation:  No VT, stable thoracic impedance.   ECG (personally reviewed): NSR, RBBB, LAFB  Labs (10/15): K 4.7, creatinine 1.6, LDL 84, HDL 35 Labs (12/15): K 4.8, creatinine 1.23 Labs (2/16): K 4.5, creatinine 1.4 Labs (8/17): LDL 103 Labs (9/18): LDL 87, HDL 30, TGs 218, K 4.1, creatinine 1.37, hgb 14 Labs (11/18): hgb 11.5, K 4.8, creatinine 1.48 Labs (2/20): K 5.1, creatinine 1.57, hgb 13.5, LDL 63  PMH: 1. Nonischemic cardiomyopathy: Diagnosed in [place] in 2011.  Possibly related to frequent PVCs. Medtronic ICD placed in 2011.  Echo (6/14) with EF 35%.  Echo (10/15) with EF 40-45% (difficult with PVCs), mildly dilated RV with moderately decreased RV systolic function.  Echo (1/16) with EF 30-35%, mildly dilated LV.   - Echo (10/18): EF 40-45%, normal RV size and systolic function.  2. PVCs: Frequent, at one point up to 1/3 of total beats.  Patient had endocardial ablation in 6/14 that was not successful.  However, PVC count dropped with initiation of mexiletine.  He is now off mexiletine.  - Combined epicardial/endocardial ablation 6/17 at [facility] was unsuccessful, likely due to mid-myocardial focus.  - Holter (7/19): 15% PVCs.  3. HTN 4. Type II diabetes 5. CAD:  LHC (1/11) with 40-50% mLAD stenosis, 90% small OM1 stenosis, EF 25%.  LHC (4/16) witih 90%  dLAD and 90% PDA, both treated with DES.  6. GERD 7. Appendectomy 8. Lumbar laminectomy 9. CKD 10. Hyperlipidemia: Myalgias with Crestor.  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  12. 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  FH: Father with MI in his 12s and CVA (smoker), mother with PAD diagnosed in her 14s.   ROS: All systems reviewed and negative except as per HPI.   Current Outpatient Medications  Medication Sig Dispense Refill  . aspirin EC 325 MG tablet Take 1 tablet (325 mg total) 2 (two) times daily after a meal by mouth. Take x 1 month post op to decrease risk of  blood clots. 60 tablet 0  . Blood Glucose Monitoring Suppl (BLOOD GLUCOSE MONITOR SYSTEM) w/Device KIT Glucose monitor to check blood sugars tid dx code E11.9 brand per insurance coverage 1 each 0  . carvedilol (COREG) 6.25 MG tablet Take 1.5 tablets (9.375 mg total) by mouth 2 (two) times daily. 180 tablet 3  . Dulaglutide (TRULICITY) 1.5 LA/4.5XM SOPN Inject 1.5 mg into the skin once a week. 12 pen 3  . ENTRESTO 49-51 MG TAKE 1 TABLET TWICE A DAY 180 tablet 4  . furosemide (LASIX) 40 MG tablet Take 1 tablet (40 mg total) by mouth daily. 90 tablet 3  . glimepiride (AMARYL) 4 MG tablet Take 1 twice a day 180 tablet 1  . glucose blood (BAYER CONTOUR NEXT TEST) test strip USE TO CHECK BLOOD SUGARS THREE TIMES DAILY 300 each 1  . Insulin Glargine (LANTUS SOLOSTAR) 100 UNIT/ML Solostar Pen Inject 38 Units into the skin at bedtime. 45 mL 4  . Lancets MISC Glucose monitor to check blood sugars tid dx code E11.9 brand per insurance coverage 300 each 1  . metFORMIN (GLUCOPHAGE-XR) 500 MG 24 hr tablet Take 4 tablets (2,000 mg total) by mouth daily with breakfast. 360 tablet 1  . rosuvastatin (CRESTOR) 10 MG tablet Take 1 tablet (10 mg total) by mouth daily. 30 tablet 3  . spironolactone (ALDACTONE) 25 MG tablet TAKE 1 TABLET BY MOUTH DAILY. 30 tablet 5  . UNIFINE PENTIPS 32G X 4 MM MISC USE TWICE DAILY WITH LANTUS AND VICTOZA 200 each 11  . canagliflozin (INVOKANA) 100 MG TABS tablet Take 1 tablet (100 mg total) by mouth daily before breakfast. (Patient not taking: Reported on 07/27/2018) 90 tablet 1   No current facility-administered medications for this encounter.    BP 116/80   Pulse 65   Wt 104 kg (229 lb 3.2 oz)   SpO2 96%   BMI 31.97 kg/m  General: NAD Neck: No JVD, no thyromegaly or thyroid nodule.  Lungs: Clear to auscultation bilaterally with normal respiratory effort. CV: Nondisplaced PMI.  Heart regular S1/S2, no S3/S4, no murmur.  1+ ankle edema.  No carotid bruit.  Normal pedal pulses.   Abdomen: Soft, nontender, no hepatosplenomegaly, no distention.  Skin: Intact without lesions or rashes.  Neurologic: Alert and oriented x 3.  Psych: Normal affect. Extremities: No clubbing or cyanosis.  HEENT: Normal.   Assessment/Plan:  1. Chronic systolic CHF: Nonischemic cardiomyopathy.  St Jude ICD.  Possibly PVC-related as there seemed to have been some improvement in EF with decreasing PVC burden on mexiletine (EF 40-45%).  With more PVCs off mexiletine, EF down to 30-35% on echo in 1/16.  He had PCI in 4/16 to distal LAD and PDA, but suspect that his cardiomyopathy is not predominantly ischemic (pre-dated coronary disease and out  of proportion to coronary disease).  Most recently in 10/18, EF back to 40-45% by echo. Holter in 7/19 showed 15% PVCs.  NYHA class II symptoms.   - Continue current Entresto. - Continue spironolactone 25 mg daily.   - Continue current Lasix. - Increase Coreg to 9.375 mg bid.   - Repeat echo.  2. 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 Epicardial/endocardial ablation in 6/17 at [facility] was unsuccessful, likely due to mid-myocardial focus.  ICD is nearing ERI.  Last holter in 7/19 showed 15% PVCs.  - Given concern for PVC-mediated CMP, would consider starting sotalol (would require hospitalization for initiation) versus mexiletine. Will discuss with Dr. Caryl Comes.  - Device nearing ERI.  With ongoing frequent ventricular ectopy, would favor generator change (needs followup with Dr. Caryl Comes).  3. CKD:  Stage 3, recent creatinine was 1.57.  - Needs to stop Naproxen, should avoid all NSAIDs.     4. CAD: Nonobstructive CAD on 2011 cath but 4/16 cath at [facility] with distal LAD and PDA stenoses treated with DES.  As above, do not think coronary disease can explain extent of his cardiomyopathy. He is currently on ASA and Crestor.  - Can decrease ASA to 81 mg daily.   - He is taking Crestor 20 mg sporadically (week or two on and week or two off) due to myalgias.  Good lipids  in 2/20. Would try taking the Crestor every other day.  5. DVT: Spontaneous (no trigger).  He is now off Xarelto.  6. Type II diabetes: He sees Dr. Cruzita Lederer for endocrinology.  Would recommend replacing glimepiride with dapagliflozin or empagliflozin given cardiomyopathy.   Followup in 3 months.   Loralie Champagne 07/27/2018

## 2018-08-03 MED FILL — SPIRONOLACTONE 25 MG TABS: 25 | 30 days supply | Qty: 30 | Fill #3

## 2018-08-03 MED FILL — TRULICITY 1.5 MG/0.5 ML PEN: 1.5 | 28 days supply | Qty: 2 | Fill #4

## 2018-08-03 MED FILL — LANTUS SOLOSTAR 100 UNITS/M: 100 | 30 days supply | Qty: 12 | Fill #1

## 2018-08-03 MED FILL — metFORMIN HCL ER 500 MG TB2: 500 | 30 days supply | Qty: 120 | Fill #3

## 2018-08-06 ENCOUNTER — Other Ambulatory Visit (HOSPITAL_COMMUNITY): Payer: Self-pay

## 2018-08-06 MED ORDER — FUROSEMIDE 40 MG PO TABS: 40.0000 mg | ORAL_TABLET | Freq: Every day | ORAL | 3 refills | Status: DC

## 2018-08-06 MED FILL — FUROSEMIDE 40 MG TAB: 40 | 30 days supply | Qty: 30 | Fill #0

## 2018-08-10 ENCOUNTER — Encounter: Payer: Self-pay | Admitting: Internal Medicine

## 2018-08-25 ENCOUNTER — Other Ambulatory Visit (HOSPITAL_COMMUNITY): Payer: BLUE CROSS/BLUE SHIELD

## 2018-08-27 ENCOUNTER — Ambulatory Visit (INDEPENDENT_AMBULATORY_CARE_PROVIDER_SITE_OTHER): Payer: BLUE CROSS/BLUE SHIELD | Admitting: Internal Medicine

## 2018-08-27 ENCOUNTER — Encounter: Payer: Self-pay | Admitting: Internal Medicine

## 2018-08-27 ENCOUNTER — Other Ambulatory Visit: Payer: Self-pay

## 2018-08-27 ENCOUNTER — Other Ambulatory Visit (HOSPITAL_COMMUNITY): Payer: BLUE CROSS/BLUE SHIELD

## 2018-08-27 VITALS — BP 120/84 | HR 77 | Ht 71.0 in | Wt 225.2 lb

## 2018-08-27 VITALS — BP 138/70 | HR 70 | Ht 71.0 in | Wt 228.0 lb

## 2018-08-27 DIAGNOSIS — E1142 Type 2 diabetes mellitus with diabetic polyneuropathy: Secondary | ICD-10-CM | POA: Diagnosis not present

## 2018-08-27 DIAGNOSIS — I493 Ventricular premature depolarization: Secondary | ICD-10-CM | POA: Diagnosis not present

## 2018-08-27 DIAGNOSIS — I5022 Chronic systolic (congestive) heart failure: Secondary | ICD-10-CM

## 2018-08-27 DIAGNOSIS — I2510 Atherosclerotic heart disease of native coronary artery without angina pectoris: Secondary | ICD-10-CM | POA: Diagnosis not present

## 2018-08-27 DIAGNOSIS — I472 Ventricular tachycardia: Secondary | ICD-10-CM

## 2018-08-27 DIAGNOSIS — I420 Dilated cardiomyopathy: Secondary | ICD-10-CM

## 2018-08-27 DIAGNOSIS — E291 Testicular hypofunction: Secondary | ICD-10-CM | POA: Diagnosis not present

## 2018-08-27 DIAGNOSIS — Z95810 Presence of automatic (implantable) cardiac defibrillator: Secondary | ICD-10-CM

## 2018-08-27 DIAGNOSIS — I4729 Other ventricular tachycardia: Secondary | ICD-10-CM

## 2018-08-27 DIAGNOSIS — E1165 Type 2 diabetes mellitus with hyperglycemia: Secondary | ICD-10-CM | POA: Diagnosis not present

## 2018-08-27 LAB — GLUCOSE, POCT (MANUAL RESULT ENTRY): POC Glucose: 172 mg/dl — AB (ref 70–99)

## 2018-08-27 MED ORDER — EMPAGLIFLOZIN 10 MG PO TABS: 10.0000 mg | ORAL_TABLET | Freq: Every day | ORAL | 11 refills | Status: DC

## 2018-08-27 MED ORDER — GLIMEPIRIDE 2 MG PO TABS: ORAL_TABLET | ORAL | 3 refills | Status: DC

## 2018-08-27 NOTE — Patient Instructions (Signed)
Medication Instructions:  Your physician recommends that you continue on your current medications as directed. Please refer to the Current Medication list given to you today.   Labwork: None ordered   Testing/Procedures: None ordered   Follow-Up: Your physician recommends that you schedule a follow-up appointment in: 3 months with Dr Klein   Any Other Special Instructions Will Be Listed Below (If Applicable).     If you need a refill on your cardiac medications before your next appointment, please call your pharmacy.   

## 2018-08-27 NOTE — Progress Notes (Signed)
He was previously on AndroGelPatient ID: Paul [name], male   DOB: 16-Oct-1945, 73 y.o.   MRN: [id number]  HPI: Paul [name] is a 73 y.o.-year-old male, returning for f/u for DM2, dx in 2001, uncontrolled, with complications (non-iCMP - CHF, CKD, mild PN, ED, DR).  Last visit 1 year and 2 mo ago.  DM2: Last hemoglobin A1c was: 07/23/2018: HbA1c 7.3% Lab Results  Component Value Date   HGBA1C 8.0 (H) 04/25/2017   HGBA1C 8.5 01/20/2016   HGBA1C 8.1 06/30/2015  09/23/2014: 8.8%  Pt is on a regimen of: - Metformin XR 2000 mg in a.m. - Amaryl 4 mg 2x a day, before meals.  stopped several months ago due to increased urination and dehydration - Victoza 1.8 mg daily in am - Lantus 38 >> 40 units at bedtime   Pt checks his sugars 3x a day: - am:  136-180, 210 >> 120-140 >> 87, 110-180 >> 56, 63-189 - 2h after b'fast: n/c  - before lunch: n/c >> 221 (overcorrection of 56) - 2h after lunch: n/c - before dinner: 274 >> n/c >> 85-179 >> 79-134 - 2h after dinner: n/c - bedtime: 243-284 >> n/c >> 123-182, 228 >> 117-148, 229 - nighttime: n/c >> 65, 70s 2x a week >> n/c Lowest sugar was 65 >> 70s ( 3 am >> drank OJ) >> 56 (he is not sure why); he has hypoglycemia awareness in the 70s. Highest sugar was 200s >> 228 >> 229.  Glucometer: OneTouch Ultra  Pt's meals are: - Breakfast: cereals + blueberries - Lunch: chinese food or sandwich, fruit - Dinner: meat + veggies or salad - Snacks: no  He used Nutrisystem 2x before >> lost 40 lbs.  -+ CKD: 07/23/2018: 32/1.57, GFR 44, glucose 142 Lab Results  Component Value Date   BUN 36 (H) 09/18/2017   CREATININE 1.56 (H) 09/18/2017  02/19/2017: 19/1.37, GFR 52, glucose 143 09/23/2014: 13/1.1, GFR >60 Angioedema with ACEI.  On Entresto. 02/19/2017: ACR 247 No results found for: MICRALBCREAT  -+ HL; last set of lipids: 07/23/2018: 117/135/32/63 Lab Results  Component Value Date   CHOL 181 09/18/2017   HDL 29 (L) 09/18/2017    LDLCALC 89 09/18/2017   TRIG 313 (H) 09/18/2017   CHOLHDL 6.2 09/18/2017  02/19/2017: 149/218/30/87  09/2014: 158/157/25/100 On Crestor 10 << Lipitor 40. 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  Dr. Zigmund Daniel. - + numbness and tingling in his L foot  H/o hypogonadism Prev. On Androgel >> stopped being covered >> stopped the med >> testosterone level remained normal:  07/23/2018: Free testosterone 32.7 (6-73) Component     Latest Ref Rng & Units 01/20/2016  Testosterone,Total,LC/MS/MS     250 - 1,100 ng/dL 336  Testosterone, Free     35.0 - 155.0 pg/mL 66.2  Sex Hormone Binding Glob.     22 - 77 nmol/L 27   PSA levels normal: Lab Results  Component Value Date   PSA 0.9 01/20/2016   PSA 1.50 07/06/2015   PSA 1.16 10/16/2012   Patient has a history of trigeminy, status post endocardial ablation at [facility]. He had an epicardial ablation in 08/2014. During that time, he had 2 stents latest, and the ablation procedure was postponed. He is followed by Dr. Dwana Curd.  He has OSA-on CPAP. H/o DVT L leg >> more swollen.  Since last visit, his dose of Coreg was increased.  He continues on Lasix 40 mg daily and spironolactone 25 mg daily.  ROS: Constitutional:  no weight gain/no weight loss, no fatigue, no subjective hyperthermia, + subjective hypothermia Eyes: no blurry vision, no xerophthalmia ENT: no sore throat, no nodules palpated in neck, no dysphagia, no odynophagia, no hoarseness Cardiovascular: no CP/no SOB/+ palpitations/no leg swelling Respiratory: no cough/no SOB/no wheezing Gastrointestinal: no N/no V/no D/no C/no acid reflux Musculoskeletal: no muscle aches/no joint aches Skin: no rashes, no hair loss Neurological: no tremors/+ numbness/+ tingling/no dizziness  I reviewed pt's medications, allergies, PMH, social hx, family hx, and changes were documented in the history of present illness. Otherwise, unchanged from my initial visit note.  Past Medical History:   Diagnosis Date  . AICD (automatic cardioverter/defibrillator) present    "on last leg not going to replace"  . Arthritis    "thumbs, neck, knees" (06/01/2013)  . Cardiomyopathy, dilated, nonischemic ([place]) 10/20/2012  . Cataract   . CHF (congestive heart failure) ([place])    "mild" (06/01/2013)  . Dysrhythmia   . Erectile dysfunction   . GERD (gastroesophageal reflux disease)   . Hypertension   . Hypogonadism male   . Hypovitaminosis D   . 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 Presence of permanent cardiac pacemaker   . Sleep apnea    cpap  . Type II diabetes mellitus ([place])   . Ventricular tachycardia ([place])    observed during stress testing, subsequent VT on EPS   Past Surgical History:  Procedure Laterality Date  . ABLATION  06-01-2013   PVC's ablated along basal inferoseptal RV by Dr Rayann Heman  . APPENDECTOMY  1969  . CARDIAC CATHETERIZATION  04/2010  . CARDIAC DEFIBRILLATOR PLACEMENT  05/09/2010   MDT ICD implanted in [place] by Dr Ky Barban  . CATARACT EXTRACTION W/ INTRAOCULAR LENS  IMPLANT, BILATERAL Bilateral   . KNEE ARTHROSCOPY Left 1984  . LUMBAR LAMINECTOMY  ~ 2004  . TONSILLECTOMY AND ADENOIDECTOMY  1972  . TOTAL KNEE ARTHROPLASTY Left 04/28/2017  . TOTAL KNEE ARTHROPLASTY Left 04/28/2017   Procedure: TOTAL KNEE ARTHROPLASTY LEFT;  Surgeon: Dorna Leitz, MD;  Location: [facility];  Service: Orthopedics;  Laterality: Left;  . V-TACH ABLATION N/A 06/01/2013   Procedure: V-TACH ABLATION;  Surgeon: Coralyn Mark, MD;  Location: [facility] CATH LAB;  Service: Cardiovascular;  Laterality: N/A;  . VASECTOMY    . VENTRICULAR ABLATION SURGERY  06/01/2013   History   Social History  . Marital Status: Married    Spouse Name: N/A  . Number of Children: 2   Occupational History  . physician   Social History Main Topics  . Smoking status: Never Smoker   . Smokeless tobacco: Never Used  . Alcohol Use: 1.2 oz/week    1 Glasses  scotch, rarely  . 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    Moved here from [facility] [place] 1.5 years ago   Current Outpatient Medications on File Prior to Visit  Medication Sig Dispense Refill  . aspirin EC 325 MG tablet Take 1 tablet (325 mg total) 2 (two) times daily after a meal by mouth. Take x 1 month post op to decrease risk of blood clots. 60 tablet 0  . Blood Glucose Monitoring Suppl (BLOOD GLUCOSE MONITOR SYSTEM) w/Device KIT Glucose monitor to check blood sugars tid dx code E11.9 brand per insurance coverage 1 each 0  . canagliflozin (INVOKANA) 100 MG TABS tablet Take 1 tablet (100 mg total) by mouth daily before  breakfast. (Patient not taking: Reported on 07/27/2018) 90 tablet 1  . carvedilol (COREG) 6.25 MG tablet Take 1.5 tablets (9.375 mg total) by mouth 2 (two) times daily. 180 tablet 3  . Dulaglutide (TRULICITY) 1.5 KG/8.1EH SOPN Inject 1.5 mg into the skin once a week. 12 pen 3  . ENTRESTO 49-51 MG TAKE 1 TABLET TWICE A DAY 180 tablet 4  . furosemide (LASIX) 40 MG tablet Take 1 tablet (40 mg total) by mouth daily. 90 tablet 3  . glimepiride (AMARYL) 4 MG tablet Take 1 twice a day 180 tablet 1  . glucose blood (BAYER CONTOUR NEXT TEST) test strip USE TO CHECK BLOOD SUGARS THREE TIMES DAILY 300 each 1  . Insulin Glargine (LANTUS SOLOSTAR) 100 UNIT/ML Solostar Pen Inject 38 Units into the skin at bedtime. 45 mL 4  . Lancets MISC Glucose monitor to check blood sugars tid dx code E11.9 brand per insurance coverage 300 each 1  . metFORMIN (GLUCOPHAGE-XR) 500 MG 24 hr tablet Take 4 tablets (2,000 mg total) by mouth daily with breakfast. 360 tablet 1  . rosuvastatin (CRESTOR) 10 MG tablet Take 1 tablet (10 mg total) by mouth daily. 30 tablet 3  . spironolactone (ALDACTONE) 25 MG tablet TAKE 1 TABLET BY MOUTH DAILY. 30 tablet 5  . UNIFINE PENTIPS 32G X 4 MM MISC USE TWICE DAILY WITH LANTUS  AND VICTOZA 200 each 11   No current facility-administered medications on file prior to visit.    Allergies  Allergen Reactions  . Lisinopril     angioedema   Family History  Problem Relation Age of Onset  . Alcohol abuse Mother   . COPD Mother   . Depression Mother   . Heart disease Mother   . Hypertension Mother   . Stroke Mother   . COPD Father   . Heart disease Father   . Hypertension Father   . Stroke Father   . Cancer Father   . Diabetes Sister   . Mental retardation Sister   . Learning disabilities Sister   . Hypertension Sister   . Alcohol abuse Brother   . Drug abuse Brother   . Heart disease Brother   . Hypertension Brother   . Coronary artery disease Brother    PE: BP 138/70   Pulse 70   Ht 5' 11" (1.803 m)   Wt 228 lb (103.4 kg)   SpO2 98%   BMI 31.80 kg/m  Body mass index is 31.8 kg/m. Wt Readings from Last 3 Encounters:  08/27/18 228 lb (103.4 kg)  07/27/18 229 lb 3.2 oz (104 kg)  09/18/17 234 lb 6.4 oz (106.3 kg)   Constitutional: overweight, in NAD Eyes: PERRLA, EOMI, no exophthalmos ENT: moist mucous membranes, no thyromegaly, no cervical lymphadenopathy Cardiovascular: RRR, No MRG Respiratory: CTA B Gastrointestinal: abdomen soft, NT, ND, BS+ Musculoskeletal: no deformities, strength intact in all 4 Skin: moist, warm, no rashes Neurological: no tremor with outstretched hands, DTR normal in all 4  ASSESSMENT: 1. DM2, insulin-dependent, uncontrolled, with complications - non-iCMP - CHF - CKD - DR - mild PN - ED  2.  History of hypogonadism  3. HL  PLAN:  1. Patient with longstanding, uncontrolled, type 2 diabetes, on a complex medication regimen, with long-acting insulin, oral medications, GLP-1 receptor agonist, and SGLT 2 inhibitor.  He returns after long absence of more than a year.  In the interim, he stopped Invokana due to increased urination and also dehydration.  Of note, he is also on  Lasix 40 and spironolactone 25.  He was  advised by his cardiologist to try again an SGLT 2 inhibitor. -At this visit, reviewing his log, his sugars are lower in the morning, even as low as 56 (unclear why) but upon questioning he did increase his Lantus slightly since last visit.  I will advise him to decrease the dose by 2 to 4 units.  Also, we will try to decrease the Amaryl and maybe even stop at next visit.  For now, we discussed about adding a low dose of Jardiance, 10 mg daily.  As of now, his blood pressure allows to continue on the same dose of Lasix, but I advised him to let me know if he feels dizzy or dehydrated, in which case, we may need to reduce the Lasix dose to be able to keep him on Jardiance.  He will need a BMP at next visit. - I suggested to:  Patient Instructions  Please continue: - Metformin 2000 mg with breakfast - Victoza 1.8 mg in before breakfast  Please decrease: - Lantus to 36-38 units at bedtime - Amaryl to 2 mg twice a day before meals  Please start: - Jardiance 10 mg in a.m. before breakfast  Please return in 2-3 months with your sugar log.   - continue checking sugars at different times of the day - check 1-2x a day, rotating checks - advised for yearly eye exams >> he is UTD - Return to clinic in 2-3 mo with sugar log     2.  History of hypogonadism -He was previously on AndroGel, however, this was not approved in the last 2 years, so he is now off testosterone supplementation.  However, testosterone levels were normal off androgen, latest 07/2018  3. HL - Reviewed latest lipid panel from last month per records brought by patient: All fractions improved: Triglycerides now normal, LDL at goal - Takes Crestor without side effects.   Philemon Kingdom, MD PhD [facility] Endocrinology

## 2018-08-27 NOTE — Patient Instructions (Signed)
Please continue: - Metformin 2000 mg with breakfast - Victoza 1.8 mg in before breakfast  Please decrease: - Lantus to 36-38 units at bedtime - Amaryl to 2 mg twice a day before meals  Please start: - Jardiance 10 mg in a.m. before breakfast  Please return in 2-3 months with your sugar log.

## 2018-08-27 NOTE — Progress Notes (Signed)
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 He had a history of presyncope and left ventricular dysfunction Ejection fraction was noted to be about 20-30%. 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 6/14 He underwent ablation and mapping Dr Greggory Brandy which was unfortunately unsuccessful, with thoughts perhaps related to epicardial focus  Since last visit, he was seen by [facility] again for consideration of redo VT/PVC  ablation. He had VT mapping in 4/16, mapped to inferior septum. He had LHC the next day, showing 90% distal LAD and 90% PDA. He had DES to both lesions. He was sent home on ASA 325, Plavix 75, and Xarelto 20. Plan was to return 1 year after DES placement to have epicardial VT ablation   Ventricular ectopy continues also to be an issue he will be following up with [facility] in March. Anote from 10/17 describes a failed epicardial /coronary sinus endocardial mapping with failure to suppress PVCs. There was a discussion of sotalol but there is concern regarding aggravation of underlying LV dysfunction. He was treated with beta blockers and Entresto.   Date PVCs  2011/ About 30%  7/19 15%   DATE TEST EF   2001   20-30 %   10/15 Echo   45-55 %   1/16 Echo  30-35%   10/18 Echo  40-45%         Date Cr K Hgb  4/19 1.56 4.3 11.5         Mild dyspnea.  Chronic edema.  No orthopnea or nocturnal dyspnea.  No chest pain.  Occasional palpitations.  Repeat echo pending.      Past Medical History:  Diagnosis Date  . AICD (automatic cardioverter/defibrillator) present    "on last leg not going to replace"  . Arthritis    "thumbs, neck,  knees" (06/01/2013)  . Cardiomyopathy, dilated, nonischemic ([place]) 10/20/2012  . Cataract   . CHF (congestive heart failure) ([place])    "mild" (06/01/2013)  . Dysrhythmia   . Erectile dysfunction   . GERD (gastroesophageal reflux disease)   . Hypertension   . Hypogonadism male   . Hypovitaminosis D   . 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 Presence of permanent cardiac pacemaker   . Sleep apnea    cpap  . Type II diabetes mellitus ([place])   . Ventricular tachycardia ([place])    observed during stress testing, subsequent VT on EPS    Past Surgical History:  Procedure Laterality Date  . ABLATION  06-01-2013   PVC's ablated along basal inferoseptal RV by Dr Rayann Heman  . APPENDECTOMY  1969  . CARDIAC CATHETERIZATION  04/2010  . CARDIAC DEFIBRILLATOR PLACEMENT  05/09/2010   MDT ICD implanted in [place] by Dr Ky Barban  . CATARACT EXTRACTION W/ INTRAOCULAR LENS  IMPLANT, BILATERAL Bilateral   . KNEE ARTHROSCOPY Left 1984  . LUMBAR LAMINECTOMY  ~ 2004  . TONSILLECTOMY AND ADENOIDECTOMY  1972  . TOTAL KNEE ARTHROPLASTY Left 04/28/2017  . TOTAL KNEE ARTHROPLASTY Left 04/28/2017   Procedure: TOTAL KNEE ARTHROPLASTY LEFT;  Surgeon: Dorna Leitz, MD;  Location: [facility];  Service: Orthopedics;  Laterality: Left;  .  V-TACH ABLATION N/A 06/01/2013   Procedure: V-TACH ABLATION;  Surgeon: Coralyn Mark, MD;  Location: [facility] CATH LAB;  Service: Cardiovascular;  Laterality: N/A;  . VASECTOMY    . VENTRICULAR ABLATION SURGERY  06/01/2013    Current Outpatient Medications  Medication Sig Dispense Refill  . aspirin EC 325 MG tablet Take 1 tablet (325 mg total) 2 (two) times daily after a meal by mouth. Take x 1 month post op to decrease risk of blood clots. 60 tablet 0  . Blood Glucose Monitoring Suppl (BLOOD GLUCOSE MONITOR SYSTEM) w/Device KIT Glucose monitor to check blood sugars tid dx code E11.9 brand per insurance coverage 1 each 0  . carvedilol (COREG)  6.25 MG tablet Take 1.5 tablets (9.375 mg total) by mouth 2 (two) times daily. 180 tablet 3  . Dulaglutide (TRULICITY) 1.5 JS/2.8BT SOPN Inject 1.5 mg into the skin once a week. 12 pen 3  . empagliflozin (JARDIANCE) 10 MG TABS tablet Take 10 mg by mouth daily. 30 tablet 11  . ENTRESTO 49-51 MG TAKE 1 TABLET TWICE A DAY 180 tablet 4  . furosemide (LASIX) 40 MG tablet Take 1 tablet (40 mg total) by mouth daily. 90 tablet 3  . glimepiride (AMARYL) 2 MG tablet Take 2 mg twice a day before meals 180 tablet 3  . glucose blood (BAYER CONTOUR NEXT TEST) test strip USE TO CHECK BLOOD SUGARS THREE TIMES DAILY 300 each 1  . Insulin Glargine (LANTUS SOLOSTAR) 100 UNIT/ML Solostar Pen Inject 38 Units into the skin at bedtime. 45 mL 4  . Lancets MISC Glucose monitor to check blood sugars tid dx code E11.9 brand per insurance coverage 300 each 1  . metFORMIN (GLUCOPHAGE-XR) 500 MG 24 hr tablet Take 4 tablets (2,000 mg total) by mouth daily with breakfast. 360 tablet 1  . rosuvastatin (CRESTOR) 10 MG tablet Take 1 tablet (10 mg total) by mouth daily. 30 tablet 3  . spironolactone (ALDACTONE) 25 MG tablet TAKE 1 TABLET BY MOUTH DAILY. 30 tablet 5  . UNIFINE PENTIPS 32G X 4 MM MISC USE TWICE DAILY WITH LANTUS AND VICTOZA 200 each 11   No current facility-administered medications for this visit.     Allergies  Allergen Reactions  . Lisinopril     angioedema    Review of Systems negative except from HPI and PMH  Physical Exam BP 120/84   Pulse 77   Ht _0  (1.803 m)   Wt 225 lb 3.2 oz (102.2 kg)   SpO2 96%   BMI 31.41 kg/m  Well developed and well nourished in no acute distress HENT normal Neck supple with JVP-flat Clear Device pocket well healed; without hematoma or erythema.  There is no tethering  Regular rate and rhythm, no  gallop No  murmur Abd-soft with active BS No Clubbing cyanosis 1+ edema Skin-warm and dry A & Oriented  Grossly normal sensory and motor function  ECG sinus at  77 16/15/43 RBBB   Assessment and  Plan  PVCs/Ventricular tach-nonsustained   Cardiomyopathy-NICM  Coronary artery disease status post stenting [facility] 2016   CHF chronic Systolic improved  ICD-[employer]  The patient's device was interrogated.  The information was reviewed. No changes were made in the programming.    Diminished R-wave  SCAF      Antiarrhythmic discussion relates to the value of PVC suppression.  It is not clear whether his PVCs are contributing or consequential to his cardiomyopathy.  He has had 2 attempts at ablation.  The  strategy we discussed with antiarrhythmic suppression with assessment of changes in LV function.  As he has no symptoms, the value of pursuing a more aggressive approach to PVC suppression would be reverse his cardiomyopathy or to attenuate his progression.  We discussed alternative sites of catheter ablation i.e. [facility] or [facility] where there is particular expertise in PVC ablation I will review with Dr. DM options.  It could include mexiletine, sotalol, dronaderone or even amiodarone as drugs would be used as a experimental approach  Device change out discussion again involved whether he wants to have anything done at all.  Part of his inclination is to do nothing; in this regard, would not remove the generator.  Alternatively would replace the generator.  If he decides that he would like to consider that, a further way to risk stratify would be to undertake PYP scanning looking for evidence of a prior infarction and then EP testing for inducibility.  His initial catheterization had demonstrated a nonischemic myopathy and EP study in that cohort is not predictive  More than 50% of 40 min was spent in counseling related to the above

## 2018-08-31 MED FILL — ENTRESTO 49 MG-51 MG TABLET: 49-51 | 30 days supply | Qty: 60 | Fill #0

## 2018-09-01 MED FILL — UNIFINE PENTIPS 32GX5/32: 32G X 4 MM | 30 days supply | Qty: 100 | Fill #0

## 2018-09-01 MED FILL — TRULICITY 1.5 MG/0.5 ML PEN: 1.5 | 28 days supply | Qty: 2 | Fill #5

## 2018-09-01 MED FILL — FUROSEMIDE 40 MG TAB: 40 | 30 days supply | Qty: 30 | Fill #1

## 2018-09-01 MED FILL — SPIRONOLACTONE 25 MG TABS: 25 | 30 days supply | Qty: 30 | Fill #4

## 2018-09-01 MED FILL — UNIFINE PENTIPS 32GX5/32": 32G X 4 MM | 30 days supply | Qty: 100 | Fill #0

## 2018-09-01 MED FILL — CARVEDILOL 6.25 MG TABLET: 6.25 | 30 days supply | Qty: 90 | Fill #1

## 2018-09-01 MED FILL — metFORMIN HCL ER 500 MG TB2: 500 | 30 days supply | Qty: 120 | Fill #4

## 2018-09-01 MED FILL — LANTUS SOLOSTAR 100 UNITS/M: 100 | 30 days supply | Qty: 12 | Fill #2

## 2018-09-04 ENCOUNTER — Telehealth (HOSPITAL_COMMUNITY): Payer: Self-pay

## 2018-09-04 ENCOUNTER — Other Ambulatory Visit: Payer: Self-pay

## 2018-09-04 ENCOUNTER — Ambulatory Visit (HOSPITAL_COMMUNITY)
Admission: RE | Admit: 2018-09-04 | Discharge: 2018-09-04 | Disposition: A | Discharge status: Home / Self Care | Payer: BLUE CROSS/BLUE SHIELD | Source: Ambulatory Visit | Attending: Cardiology | Admitting: Cardiology

## 2018-09-04 DIAGNOSIS — I420 Dilated cardiomyopathy: Secondary | ICD-10-CM

## 2018-09-04 DIAGNOSIS — I110 Hypertensive heart disease with heart failure: Secondary | ICD-10-CM | POA: Insufficient documentation

## 2018-09-04 DIAGNOSIS — I5022 Chronic systolic (congestive) heart failure: Secondary | ICD-10-CM

## 2018-09-04 DIAGNOSIS — E119 Type 2 diabetes mellitus without complications: Secondary | ICD-10-CM | POA: Diagnosis not present

## 2018-09-04 NOTE — Progress Notes (Signed)
  Echocardiogram 2D Echocardiogram has been performed.  Jennette Dubin 09/04/2018, 8:52 AM

## 2018-09-04 NOTE — Telephone Encounter (Signed)
-----   Message from Larey Dresser, MD sent at 09/04/2018 11:31 AM EDT ----- EF 55%, improved from 45% and now in normal range.  Good news.

## 2018-09-04 NOTE — Telephone Encounter (Signed)
Left VM to call back regarding ECHO results

## 2018-09-07 ENCOUNTER — Telehealth (HOSPITAL_COMMUNITY): Payer: Self-pay

## 2018-09-07 NOTE — Telephone Encounter (Signed)
Pt aware of ECHO results. Pt appreciative.

## 2018-09-07 NOTE — Telephone Encounter (Signed)
-----   Message from Larey Dresser, MD sent at 09/04/2018 11:31 AM EDT ----- EF 55%, improved from 45% and now in normal range.  Good news.

## 2018-09-18 ENCOUNTER — Telehealth: Payer: Self-pay | Admitting: Internal Medicine

## 2018-09-18 NOTE — Telephone Encounter (Signed)
MEDICATION: empagliflozin (JARDIANCE) 10 MG TABS tablet: written order did not work glucose blood (BAYER CONTOUR NEXT TEST) test strip  PHARMACY:  [facility]  IS THIS A 90 DAY SUPPLY :   IS PATIENT OUT OF MEDICATION:   IF NOT; HOW MUCH IS LEFT:   LAST APPOINTMENT DATE: @3 /05/2019  NEXT APPOINTMENT DATE:@7 /22/2020  DO WE HAVE YOUR PERMISSION TO LEAVE A DETAILED MESSAGE:  OTHER COMMENTS:    **Let patient know to contact pharmacy at the end of the day to make sure medication is ready. **  ** Please notify patient to allow 48-72 hours to process**  **Encourage patient to contact the pharmacy for refills or they can request refills through [facility]**

## 2018-09-21 MED ORDER — GLUCOSE BLOOD VI STRP: ORAL_STRIP | 1 refills | Status: DC

## 2018-09-21 MED ORDER — EMPAGLIFLOZIN 10 MG PO TABS: 10.0000 mg | ORAL_TABLET | Freq: Every day | ORAL | 11 refills | Status: DC

## 2018-09-21 MED FILL — JARDIANCE 10 MG TABLET: 10 | 30 days supply | Qty: 30 | Fill #0

## 2018-09-21 NOTE — Telephone Encounter (Signed)
Both have been sent. 

## 2018-09-29 MED FILL — metFORMIN HCL ER 500 MG TB2: 500 | 30 days supply | Qty: 120 | Fill #5

## 2018-09-29 MED FILL — CARVEDILOL 6.25 MG TABLET: 6.25 | 30 days supply | Qty: 90 | Fill #2

## 2018-09-29 MED FILL — ENTRESTO 49 MG-51 MG TABLET: 49-51 | 30 days supply | Qty: 60 | Fill #1

## 2018-09-29 MED FILL — SPIRONOLACTONE 25 MG TABS: 25 | 30 days supply | Qty: 30 | Fill #5

## 2018-09-29 MED FILL — LANTUS SOLOSTAR 100 UNITS/M: 100 | 30 days supply | Qty: 12 | Fill #3

## 2018-09-29 MED FILL — FUROSEMIDE 40 MG TAB: 40 | 30 days supply | Qty: 30 | Fill #2

## 2018-09-29 MED FILL — TRULICITY 1.5 MG/0.5 ML PEN: 1.5 | 28 days supply | Qty: 2 | Fill #6

## 2018-10-01 ENCOUNTER — Encounter: Payer: Self-pay | Admitting: Internal Medicine

## 2018-10-06 ENCOUNTER — Other Ambulatory Visit: Payer: Self-pay

## 2018-10-06 ENCOUNTER — Telehealth: Payer: Self-pay

## 2018-10-06 MED ORDER — GLUCOSE BLOOD VI STRP: ORAL_STRIP | 12 refills | Status: DC

## 2018-10-06 MED ORDER — ACCU-CHEK GUIDE ME W/DEVICE KIT: 1.0000 | PACK | Freq: Three times a day (TID) | 0 refills | Status: DC

## 2018-10-06 NOTE — Telephone Encounter (Signed)
PA for Contour Next has been denied, patient must use formulary Accu-chek.  New order for Accu-chek meter and strips sent to the pharmacy.

## 2018-10-07 MED ORDER — GLUCOSE BLOOD VI STRP: ORAL_STRIP | 12 refills | Status: DC

## 2018-10-07 MED ORDER — ONETOUCH VERIO W/DEVICE KIT: PACK | 0 refills | Status: DC

## 2018-10-07 MED FILL — ONE TOUCH VERIO TEST STRIP: 30 days supply | Qty: 100 | Fill #0

## 2018-10-07 MED FILL — ONETOUCH VERIO METER SYSTEM: W/DEVICE | 30 days supply | Qty: 1 | Fill #0

## 2018-10-07 NOTE — Telephone Encounter (Signed)
Pharmacy now states Accu-Chek is not covered and to use Freestyle or OneTouch.

## 2018-10-07 NOTE — Addendum Note (Signed)
Addended by: Cardell Peach I on: 10/07/2018 02:17 PM   Modules accepted: Orders

## 2018-10-07 NOTE — Addendum Note (Signed)
Addended by: Cardell Peach I on: 10/07/2018 02:12 PM   Modules accepted: Orders

## 2018-10-14 MED FILL — TAMSULOSIN HCL 0.4 MG CAP: 0.4 | 30 days supply | Qty: 30 | Fill #0

## 2018-10-21 MED FILL — JARDIANCE 10 MG TABLET: 10 | 30 days supply | Qty: 30 | Fill #1

## 2018-10-27 ENCOUNTER — Ambulatory Visit (HOSPITAL_COMMUNITY)
Admission: RE | Admit: 2018-10-27 | Discharge: 2018-10-27 | Disposition: A | Discharge status: Home / Self Care | Payer: BLUE CROSS/BLUE SHIELD | Source: Ambulatory Visit | Attending: Cardiology | Admitting: Cardiology

## 2018-10-27 ENCOUNTER — Other Ambulatory Visit: Payer: Self-pay

## 2018-10-27 ENCOUNTER — Telehealth (HOSPITAL_COMMUNITY): Payer: Self-pay

## 2018-10-27 ENCOUNTER — Encounter (HOSPITAL_COMMUNITY): Payer: BLUE CROSS/BLUE SHIELD | Admitting: Cardiology

## 2018-10-27 DIAGNOSIS — I493 Ventricular premature depolarization: Secondary | ICD-10-CM

## 2018-10-27 DIAGNOSIS — I5022 Chronic systolic (congestive) heart failure: Secondary | ICD-10-CM

## 2018-10-27 NOTE — Telephone Encounter (Signed)
Reviewed AVS with pt:  1. Zio patch x 3 days: quantify PVCs /registered to mail to home 2. 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 Followup in 3 months. Aron Baba

## 2018-10-27 NOTE — Progress Notes (Signed)
Heart Failure TeleHealth Note  Due to national recommendations of social distancing due to [place] 19, Audio/video telehealth visit is felt to be most appropriate for this patient at this time.  See MyChart message from today for patient consent regarding telehealth for [facility].  Date:  10/27/2018   ID:  Paul [name], DOB 1945/10/28, MRN [id number]  Location: Home  Provider location: [place] [facility] Type of Visit: Established patient   PCP:  Patient, No Pcp Per  Cardiologist:  Dr. Aundra Dubin EP: Dr. Caryl Comes  Chief Complaint: Fatigue   History of Present Illness: Paul [name] is a 73 y.o. male who presents via audio/video conferencing for a telehealth visit today.     he denies symptoms worrisome for COVID 19.   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 Patient has had a cardiomyopathy known since around 2011.  Cath at that time showed nonobstructive CAD but EF 25%.  He got a Medtronic ICD.  Initial workup was in [place], and he subsequently moved to [place].  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    He has been noted to have frequent PVCs, at one point up to 1/3 of beats.  VT ablation did not help much, but mexiletine seemed to cut back on PVC frequency.  He is no longer taking mexiletine. Dr Ouida Sills felt like his PVCs increased again.  He was seen at [facility] for possible VT ablation.  During that visit, his ICD was interrogated and PVCs were noted to be up to > 1000/hr with short runs NSVT.  Echo was repeated in 1/16 and showed EF down to 30-35%.  Also of note, patient had an untriggered DVT in 12/15 and was on Xarelto for a time but is no longer taking it. He was diagnosed with OSA and is on CPAP.   He had VT mapping at [facility] in 4/16, mapped to inferior septum.  He had LHC the next day, showing 90% distal LAD and 90% PDA.  He had DES to both lesions.  He was sent home on ASA 325,  Plavix 75, and Xarelto 20.  Plan was to return 1 year after DES placement to have epicardial VT ablation (off Plavix).    In 6/17, he had unsuccessful epicardial/endocardial VT ablation at [facility]. Suspect mid-myocardial focus.   Echo in 10/18 showed EF 40-45%.  Echo in 3/20 showed EF improved to normal range, 55-60% with normal RV.  He has been doing well.  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  No palpitations.  No syncope/lightheadedness.  No chest pain.  No exertional dyspnea.  Weight has been stable, no peripheral edema.    Labs (10/15): K 4.7, creatinine 1.6, LDL 84, HDL 35 Labs (12/15): K 4.8, creatinine 1.23 Labs (2/16): K 4.5, creatinine 1.4 Labs (8/17): LDL 103 Labs (9/18): LDL 87, HDL 30, TGs 218, K 4.1, creatinine 1.37, hgb 14 Labs (11/18): hgb 11.5, K 4.8, creatinine 1.48 Labs (2/20): K 5.1, creatinine 1.57, hgb 13.5, LDL 63  PMH: 1. Nonischemic cardiomyopathy: Diagnosed in [place] in 2011.  Possibly related to frequent PVCs. Medtronic ICD placed in 2011.  Echo (6/14) with EF 35%.  Echo (10/15) with EF 40-45% (difficult with PVCs), mildly dilated RV with moderately decreased RV systolic function.  Echo (1/16) with EF 30-35%, mildly dilated LV.   - Echo (10/18): EF 40-45%, normal RV size and systolic function.  - Echo (3/20):  EF 55-60%, normal RV.  2. PVCs: Frequent, at one point up to 1/3 of total beats.  Patient had endocardial ablation in 6/14 that was not successful.  However, PVC count dropped with initiation of mexiletine.  He is now off mexiletine.  - Combined epicardial/endocardial ablation 6/17 at [facility] was unsuccessful, likely due to mid-myocardial focus.  - Holter (7/19): 15% PVCs.  3. HTN 4. Type II diabetes 5. CAD:  LHC (1/11) with 40-50% mLAD stenosis, 90% small OM1 stenosis, EF 25%.  LHC (4/16) witih 90% dLAD and 90% PDA, both treated with DES.  6. GERD 7. Appendectomy 8. Lumbar laminectomy 9. CKD 10. Hyperlipidemia: Myalgias with Crestor.    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  12. OSA:  CPAP  Current Outpatient Medications  Medication Sig Dispense Refill   aspirin EC 325 MG tablet Take 1 tablet (325 mg total) 2 (two) times daily after a meal by mouth. Take x 1 month post op to decrease risk of blood clots. 60 tablet 0   Blood Glucose Monitoring Suppl (ONETOUCH VERIO) w/Device KIT Use to check blood sugar 3 times a day. 1 kit 0   carvedilol (COREG) 6.25 MG tablet Take 1.5 tablets (9.375 mg total) by mouth 2 (two) times daily. 180 tablet 3   Dulaglutide (TRULICITY) 1.5 IY/6.4BR SOPN Inject 1.5 mg into the skin once a week. 12 pen 3   empagliflozin (JARDIANCE) 10 MG TABS tablet Take 10 mg by mouth daily. 30 tablet 11   ENTRESTO 49-51 MG TAKE 1 TABLET TWICE A DAY 180 tablet 4   furosemide (LASIX) 40 MG tablet Take 1 tablet (40 mg total) by mouth daily. 90 tablet 3   glimepiride (AMARYL) 2 MG tablet Take 2 mg twice a day before meals 180 tablet 3   glucose blood (ONETOUCH VERIO) test strip Use to check blood sugar 3 times a day. 200 each 12   Insulin Glargine (LANTUS SOLOSTAR) 100 UNIT/ML Solostar Pen Inject 38 Units into the skin at bedtime. 45 mL 4   Lancets MISC Glucose monitor to check blood sugars tid dx code E11.9 brand per insurance coverage 300 each 1   metFORMIN (GLUCOPHAGE-XR) 500 MG 24 hr tablet Take 4 tablets (2,000 mg total) by mouth daily with breakfast. 360 tablet 1   rosuvastatin (CRESTOR) 10 MG tablet Take 1 tablet (10 mg total) by mouth daily. 30 tablet 3   spironolactone (ALDACTONE) 25 MG tablet TAKE 1 TABLET BY MOUTH DAILY. 30 tablet 5   UNIFINE PENTIPS 32G X 4 MM MISC USE TWICE DAILY WITH LANTUS AND VICTOZA 200 each 11   No current facility-administered medications for this encounter.     Allergies:   Lisinopril   Social History:  The patient  reports that he has never smoked. He has never used smokeless tobacco. He reports current alcohol use of about 2.0 standard drinks of alcohol per week. He  reports that he does not use drugs.   Family History:  The patient's family history includes Alcohol abuse in his brother and mother; COPD in his father and mother; Cancer in his father; Coronary artery disease in his brother; Depression in his mother; Diabetes in his sister; Drug abuse in his brother; Heart disease in his brother, father, and mother; Hypertension in his brother, father, mother, and sister; Learning disabilities in his sister; Mental retardation in his sister; Stroke in his father and mother.   ROS:  Please see the history of present illness.   All other systems are personally reviewed and  negative.   Exam:  (Video/Tele Health Call; Exam is subjective and or/visual.) General:  Speaks in full sentences. No resp difficulty. Neck: No JVD Lungs: Normal respiratory effort with conversation.  Abdomen: Non-distended per patient report Extremities: Pt denies edema. Neuro: Alert & oriented x 3.   Recent Labs: No results found for requested labs within last 8760 hours.  Personally reviewed   Wt Readings from Last 3 Encounters:  08/27/18 102.2 kg (225 lb 3.2 oz)  08/27/18 103.4 kg (228 lb)  07/27/18 104 kg (229 lb 3.2 oz)     ASSESSMENT AND PLAN:  1. Chronic systolic CHF: Primarily nonischemic cardiomyopathy.  St Jude ICD.  Possibly PVC-related as there seemed to have been some improvement in EF with decreasing PVC burden on mexiletine (EF 40-45%).  With more PVCs off mexiletine, EF down to 30-35% on echo in 1/16.  He had PCI in 4/16 to distal LAD and PDA, but suspect that his cardiomyopathy is not predominantly ischemic (pre-dated coronary disease and out of proportion to coronary disease).  Echo in 10/18 showed EF back to 40-45%. Holter in 7/19 showed 15% PVCs. Echo in 3/20 showed EF up to normal range, 55-60% with normal RV.  NYHA class I-II symptoms.  Weight stable.  Would leave his cardiac meds at current doses.  - Continue current Entresto, 49/51 bid. - Continue spironolactone  25 mg daily.   - Continue current Lasix 40 mg daily. - Continue Coreg 9.375 mg bid.   - Continue empagliflozin.  2. 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 Epicardial/endocardial ablation in 6/17 at [facility] was unsuccessful, likely due to mid-myocardial focus.  ICD is nearing ERI.  Last holter in 7/19 showed 15% PVCs.  - I will have him wear a 3-day Zio patch.  I am interested to see if the EF improved in the setting of a lower PVC percentage.  - Would hold off on anti-arrhythmic medication with correction of EF.   3. CKD:  Stage 3, recent creatinine was 1.57.  - Should avoid all NSAIDs.     - I will arrange for a BMET.  4. CAD: Nonobstructive CAD on 2011 cath but 4/16 cath at [facility] with distal LAD and PDA stenoses treated with DES.  As above, do not think coronary disease can explain extent of his cardiomyopathy. He is currently on ASA and Crestor.  - Can decrease ASA to 81 mg daily.   - He is taking Crestor 40 mg daily per his report.  Good lipids in 2/20.  5. DVT: Spontaneous (no trigger).  He is now off Xarelto.  6. Type II diabetes: He sees Dr. Cruzita Lederer for endocrinology.  He is on empagliflozin.   COVID screen The patient does not have any symptoms that suggest any further testing/ screening at this time.  Social distancing reinforced today.  Patient Risk: After full review of this patients clinical status, I feel that they are at moderate risk for cardiac decompensation at this time.  Relevant cardiac medications were reviewed at length with the patient today. The patient does not have concerns regarding their medications at this time.   Recommended follow-up:  4 months  Today, I have spent 18 minutes with the patient with telehealth technology discussing the above issues .    Signed, Loralie Champagne, MD  10/27/2018 4:29 PM  [place] [address] [facility] [place] [address] [phone] (office) [phone] (fax)

## 2018-10-29 LAB — CUP PACEART INCLINIC DEVICE CHECK
Battery Voltage: 2.33 V
Brady Statistic AP VP Percent: 0 %
Brady Statistic AP VS Percent: 0 %
Brady Statistic AS VP Percent: 0.17 %
Brady Statistic AS VS Percent: 99.83 %
Brady Statistic RA Percent Paced: 0 %
Brady Statistic RV Percent Paced: 0.16 %
Date Time Interrogation Session: 20200312191140
HighPow Impedance: 304 Ohm
HighPow Impedance: 43 Ohm
HighPow Impedance: 54 Ohm
Implantable Lead Implant Date: 20111201
Implantable Lead Implant Date: 20111201
Implantable Lead Location: 753859
Implantable Lead Location: 753860
Implantable Lead Model: 5076
Implantable Lead Model: 6947
Implantable Pulse Generator Implant Date: 20111201
Lead Channel Impedance Value: 399 Ohm
Lead Channel Impedance Value: 418 Ohm
Lead Channel Setting Pacing Amplitude: 0.5 V
Lead Channel Setting Pacing Pulse Width: 0.4 ms
Lead Channel Setting Sensing Sensitivity: 0.3 mV

## 2018-11-02 ENCOUNTER — Other Ambulatory Visit (HOSPITAL_COMMUNITY): Payer: Self-pay | Admitting: Cardiology

## 2018-11-02 MED FILL — LANTUS SOLOSTAR 100 UNITS/M: 100 | 30 days supply | Qty: 12 | Fill #4

## 2018-11-02 MED FILL — TRULICITY 1.5 MG/0.5 ML PEN: 1.5 | 28 days supply | Qty: 2 | Fill #7

## 2018-11-02 MED FILL — CARVEDILOL 6.25 MG TABLET: 6.25 | 30 days supply | Qty: 90 | Fill #3

## 2018-11-02 MED FILL — UNIFINE PENTIPS 32GX5/32": 32G X 4 MM | 30 days supply | Qty: 100 | Fill #1

## 2018-11-02 MED FILL — metFORMIN HCL ER 500 MG TB2: 500 | 30 days supply | Qty: 120 | Fill #6

## 2018-11-02 MED FILL — UNIFINE PENTIPS 32GX5/32: 32G X 4 MM | 30 days supply | Qty: 100 | Fill #1

## 2018-11-02 MED FILL — ENTRESTO 49 MG-51 MG TABLET: 49-51 | 30 days supply | Qty: 60 | Fill #2

## 2018-11-02 MED FILL — FUROSEMIDE 40 MG TAB: 40 | 30 days supply | Qty: 30 | Fill #3

## 2018-11-03 MED FILL — SPIRONOLACTONE 25 MG TABS: 25 | 30 days supply | Qty: 30 | Fill #0

## 2018-11-13 MED FILL — JARDIANCE 10 MG TABLET: 10 | 30 days supply | Qty: 30 | Fill #2

## 2018-11-13 MED FILL — TAMSULOSIN HCL 0.4 MG CAP: 0.4 | 30 days supply | Qty: 30 | Fill #1

## 2018-11-16 DIAGNOSIS — I493 Ventricular premature depolarization: Secondary | ICD-10-CM | POA: Diagnosis not present

## 2018-11-20 ENCOUNTER — Other Ambulatory Visit: Payer: Self-pay

## 2018-11-20 ENCOUNTER — Other Ambulatory Visit (HOSPITAL_COMMUNITY): Payer: Self-pay | Admitting: *Deleted

## 2018-11-20 ENCOUNTER — Ambulatory Visit (HOSPITAL_COMMUNITY)
Admission: RE | Admit: 2018-11-20 | Discharge: 2018-11-20 | Disposition: A | Discharge status: Home / Self Care | Payer: Medicare Other | Source: Ambulatory Visit | Attending: Cardiology | Admitting: Cardiology

## 2018-11-20 DIAGNOSIS — I493 Ventricular premature depolarization: Secondary | ICD-10-CM

## 2018-12-02 ENCOUNTER — Ambulatory Visit (HOSPITAL_COMMUNITY)
Admission: RE | Admit: 2018-12-02 | Discharge: 2018-12-02 | Disposition: A | Discharge status: Home / Self Care | Payer: Medicare Other | Source: Ambulatory Visit | Attending: Cardiology | Admitting: Cardiology

## 2018-12-02 ENCOUNTER — Other Ambulatory Visit (HOSPITAL_COMMUNITY): Payer: Self-pay | Admitting: *Deleted

## 2018-12-02 DIAGNOSIS — I493 Ventricular premature depolarization: Secondary | ICD-10-CM

## 2018-12-03 MED FILL — metFORMIN HCL ER 500 MG TB2: 500 | 30 days supply | Qty: 120 | Fill #7

## 2018-12-03 MED FILL — TRULICITY 1.5 MG/0.5 ML PEN: 1.5 | 28 days supply | Qty: 2 | Fill #8

## 2018-12-03 MED FILL — FUROSEMIDE 40 MG TAB: 40 | 30 days supply | Qty: 30 | Fill #4

## 2018-12-03 MED FILL — ENTRESTO 49 MG-51 MG TABLET: 49-51 | 30 days supply | Qty: 60 | Fill #3

## 2018-12-03 MED FILL — LANTUS SOLOSTAR 100 UNITS/M: 100 | 30 days supply | Qty: 12 | Fill #5

## 2018-12-03 MED FILL — CARVEDILOL 6.25 MG TABLET: 6.25 | 30 days supply | Qty: 90 | Fill #4

## 2018-12-03 MED FILL — SPIRONOLACTONE 25 MG TABS: 25 | 30 days supply | Qty: 30 | Fill #1

## 2018-12-07 ENCOUNTER — Telehealth (INDEPENDENT_AMBULATORY_CARE_PROVIDER_SITE_OTHER): Payer: BC Managed Care – PPO | Admitting: Internal Medicine

## 2018-12-07 ENCOUNTER — Other Ambulatory Visit: Payer: Self-pay

## 2018-12-07 VITALS — Ht 71.0 in | Wt 215.0 lb

## 2018-12-07 DIAGNOSIS — I420 Dilated cardiomyopathy: Secondary | ICD-10-CM

## 2018-12-07 DIAGNOSIS — I493 Ventricular premature depolarization: Secondary | ICD-10-CM | POA: Diagnosis not present

## 2018-12-07 DIAGNOSIS — Z95810 Presence of automatic (implantable) cardiac defibrillator: Secondary | ICD-10-CM | POA: Diagnosis not present

## 2018-12-07 NOTE — Progress Notes (Signed)
Electrophysiology TeleHealth Note   Due to national recommendations of social distancing due to COVID 19, an audio/video telehealth visit is felt to be most appropriate for this patient at this time.  See MyChart message from today for the patient's consent to telehealth for Paul [name].   Date:  12/07/2018   ID:  [name], DOB [birth year], MRN [id number]  Location: patient's home  Provider location: [address] [place]  Evaluation Performed: Follow-up visit  PCP:  Patient, No Pcp Per  Cardiologist:    dm Electrophysiologist:  SK   Chief Complaint:  PVCs  History of Present Illness:    Paul [name] is a 73 y.o. male who presents via audio/video conferencing for a telehealth visit today.  Since last being seen in our clinic for PVCs and cardiomyopathy w ICD, the patient reports doing pretty well   He has had ablation attempt 4/16 at [facility] and epicardial 6/17  Suspect mid myocardial focus  Date PVCs   2011/ About 30%   7/19 15%   6/20  2.5% ZIO   DATE TEST EF   2001   20-30 %   10/15 Echo   45-55 %   1/16 Echo  30-35%   10/18 Echo  40-45%   3/20 Echo 55%    Date Cr K Hgb  4/19 1.56 4.3 11.5         Orthostasis improved with stopping diuretics and decreasing carvedilol;  No recurrent edema  Has decided not to undergo generator replacement but may be interested in system extraction   The patient denies symptoms of fevers, chills, cough, or new SOB worrisome for COVID 19.    Past Medical History:  Diagnosis Date  . AICD (automatic cardioverter/defibrillator) present    "on last leg not going to replace"  . Arthritis    "thumbs, neck, knees" (06/01/2013)  . Cardiomyopathy, dilated, nonischemic ([place]) 10/20/2012  . Cataract   . CHF (congestive heart failure) ([place])    "mild" (06/01/2013)  . Dysrhythmia   . Erectile dysfunction   . GERD (gastroesophageal reflux disease)   . Hypertension   . Hypogonadism male   .  Hypovitaminosis D   . 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 Presence of permanent cardiac pacemaker   . Sleep apnea    cpap  . Type II diabetes mellitus ([place])   . Ventricular tachycardia ([place])    observed during stress testing, subsequent VT on EPS    Past Surgical History:  Procedure Laterality Date  . ABLATION  06-01-2013   PVC's ablated along basal inferoseptal RV by Dr Rayann Heman  . APPENDECTOMY  1969  . CARDIAC CATHETERIZATION  04/2010  . CARDIAC DEFIBRILLATOR PLACEMENT  05/09/2010   MDT ICD implanted in [place] by Dr Ky Barban  . CATARACT EXTRACTION W/ INTRAOCULAR LENS  IMPLANT, BILATERAL Bilateral   . KNEE ARTHROSCOPY Left 1984  . LUMBAR LAMINECTOMY  ~ 2004  . TONSILLECTOMY AND ADENOIDECTOMY  1972  . TOTAL KNEE ARTHROPLASTY Left 04/28/2017  . TOTAL KNEE ARTHROPLASTY Left 04/28/2017   Procedure: TOTAL KNEE ARTHROPLASTY LEFT;  Surgeon: Dorna Leitz, MD;  Location: [facility];  Service: Orthopedics;  Laterality: Left;  . V-TACH ABLATION N/A 06/01/2013   Procedure: V-TACH ABLATION;  Surgeon: Coralyn Mark, MD;  Location: [facility] CATH LAB;  Service: Cardiovascular;  Laterality: N/A;  . VASECTOMY    . VENTRICULAR ABLATION SURGERY  06/01/2013    Current Outpatient Medications  Medication Sig  Dispense Refill  . aspirin EC 325 MG tablet Take 1 tablet (325 mg total) 2 (two) times daily after a meal by mouth. Take x 1 month post op to decrease risk of blood clots. 60 tablet 0  . Blood Glucose Monitoring Suppl (ONETOUCH VERIO) w/Device KIT Use to check blood sugar 3 times a day. 1 kit 0  . carvedilol (COREG) 6.25 MG tablet Take 6.25 mg by mouth 2 (two) times daily with a meal.    . Dulaglutide (TRULICITY) 1.5 JQ/7.3AL SOPN Inject 1.5 mg into the skin once a week. 12 pen 3  . empagliflozin (JARDIANCE) 10 MG TABS tablet Take 10 mg by mouth daily. 30 tablet 11  . ENTRESTO 49-51 MG TAKE 1 TABLET TWICE A DAY 180 tablet 4  . glucose blood (ONETOUCH  VERIO) test strip Use to check blood sugar 3 times a day. 200 each 12  . Insulin Glargine (LANTUS SOLOSTAR) 100 UNIT/ML Solostar Pen Inject 38 Units into the skin at bedtime. 45 mL 4  . Lancets MISC Glucose monitor to check blood sugars tid dx code E11.9 brand per insurance coverage 300 each 1  . metFORMIN (GLUCOPHAGE-XR) 500 MG 24 hr tablet Take 4 tablets (2,000 mg total) by mouth daily with breakfast. 360 tablet 1  . rosuvastatin (CRESTOR) 10 MG tablet Take 1 tablet (10 mg total) by mouth daily. 30 tablet 3  . spironolactone (ALDACTONE) 25 MG tablet TAKE 1 TABLET BY MOUTH ONCE DAILY 30 tablet 5  . tamsulosin (FLOMAX) 0.4 MG CAPS capsule Take 0.4 mg by mouth.    Marland Kitchen UNIFINE PENTIPS 32G X 4 MM MISC USE TWICE DAILY WITH LANTUS AND VICTOZA 200 each 11   No current facility-administered medications for this visit.     Allergies:   Lisinopril   Social History:  The patient  reports that he has never smoked. He has never used smokeless tobacco. He reports current alcohol use of about 2.0 standard drinks of alcohol per week. He reports that he does not use drugs.   Family History:  The patient's   family history includes Alcohol abuse in his brother and mother; COPD in his father and mother; Cancer in his father; Coronary artery disease in his brother; Depression in his mother; Diabetes in his sister; Drug abuse in his brother; Heart disease in his brother, father, and mother; Hypertension in his brother, father, mother, and sister; Learning disabilities in his sister; Mental retardation in his sister; Stroke in his father and mother.   ROS:  Please see the history of present illness.   All other systems are personally reviewed and negative.    Exam:    Vital Signs:  Ht '5\' 11"'  (1.803 m)   Wt 215 lb (97.5 kg)   BMI 29.99 kg/m     Well appearing, alert and conversant, regular work of breathing,  good skin color Eyes- anicteric, neuro- grossly intact, skin- no apparent rash or lesions or cyanosis,  mouth- oral mucosa is pink   Labs/Other Tests and Data Reviewed:    Recent Labs: No results found for requested labs within last 8760 hours.   Wt Readings from Last 3 Encounters:  12/07/18 215 lb (97.5 kg)  08/27/18 225 lb 3.2 oz (102.2 kg)  08/27/18 228 lb (103.4 kg)     Other studies personally reviewed: Additional studies/ records that were reviewed today include: As above  Last device remote is reviewed from [facility] PDF dated 3/20  Device at [facility]:  PVCs/Ventricular tach-nonsustained   Cardiomyopathy-NICM  Coronary artery disease status post stenting [facility] 2016   CHF chronic Systolic improved  ICD-Medtronic   At [facility] 3/20    Diminished R-wave  SCAF  Anemia  Renal insufficiency grade 3  Discussed results of ZIO with decreased PVCs>> 2.5%   Discussed ICD extraction as a system and he will consider this w wife and get back to  [place]  Otherwise will see prn  COVID 19 screen The patient denies symptoms of COVID 19 at this time.  The importance of social distancing was discussed today.  Follow-up:   Prn  Next remote:    Current medicines are reviewed at length with the patient    Labs/ tests ordered today include: CBC BMET  No orders of the defined types were placed in this encounter.   Future tests ( post COVID )   s  Patient Risk:  after full review of this patients clinical status, I feel that they are at moderate risk at this time.  Today, I have spent  19 minutes with the patient with telehealth technology discussing the above.  Signed, Virl Axe, MD  12/07/2018 2:29 PM     [facility] [address] [place] [address] [phone] (office) [phone] (fax)

## 2018-12-08 ENCOUNTER — Encounter (INDEPENDENT_AMBULATORY_CARE_PROVIDER_SITE_OTHER): Payer: BC Managed Care – PPO | Admitting: Ophthalmology

## 2018-12-08 ENCOUNTER — Other Ambulatory Visit: Payer: Self-pay

## 2018-12-08 DIAGNOSIS — H43813 Vitreous degeneration, bilateral: Secondary | ICD-10-CM

## 2018-12-08 DIAGNOSIS — H33302 Unspecified retinal break, left eye: Secondary | ICD-10-CM

## 2018-12-08 DIAGNOSIS — E11319 Type 2 diabetes mellitus with unspecified diabetic retinopathy without macular edema: Secondary | ICD-10-CM | POA: Diagnosis not present

## 2018-12-08 DIAGNOSIS — H4312 Vitreous hemorrhage, left eye: Secondary | ICD-10-CM

## 2018-12-16 MED FILL — TAMSULOSIN HCL 0.4 MG CAP: 0.4 | 30 days supply | Qty: 30 | Fill #2

## 2018-12-16 MED FILL — JARDIANCE 10 MG TABLET: 10 | 30 days supply | Qty: 30 | Fill #3

## 2018-12-24 ENCOUNTER — Telehealth: Payer: Self-pay | Admitting: Internal Medicine

## 2018-12-24 NOTE — Telephone Encounter (Signed)
Spoke with patient, advised I will make Dr. Caryl Comes and Rolanda Lundborg, RN, aware that he wishes to proceed with referral for ICD extraction. Pt is agreeable and thanked me for my call.

## 2018-12-24 NOTE — Telephone Encounter (Signed)
New message   Patient would like a referral to have his defibrulator taken out. Please call.

## 2018-12-25 NOTE — Telephone Encounter (Signed)
L  Check with Sonia Baller please, and see if [facility] is still doing extractions, if so could we refer to him, and if not, check with Dr Ouida Sills and ask if he has a tertiary care center preference  Thanks SK

## 2019-01-01 MED FILL — TRULICITY 1.5 MG/0.5 ML PEN: 1.5 | 28 days supply | Qty: 2 | Fill #9

## 2019-01-01 MED FILL — LANTUS SOLOSTAR 100 UNITS/M: 100 | 30 days supply | Qty: 12 | Fill #6

## 2019-01-01 MED FILL — SPIRONOLACTONE 25 MG TABS: 25 | 30 days supply | Qty: 30 | Fill #2

## 2019-01-01 MED FILL — ONETOUCH VERIO TEST STRIP: 30 days supply | Qty: 100 | Fill #1

## 2019-01-03 NOTE — Telephone Encounter (Signed)
Schedule in the coming weeks. [facility]

## 2019-01-05 ENCOUNTER — Other Ambulatory Visit: Payer: Self-pay

## 2019-01-05 ENCOUNTER — Telehealth: Payer: Self-pay | Admitting: Internal Medicine

## 2019-01-05 MED ORDER — METFORMIN HCL ER 500 MG PO TB24: 2000.0000 mg | ORAL_TABLET | Freq: Every day | ORAL | 1 refills | Status: DC

## 2019-01-05 MED FILL — METFORMIN HCL ER 500 MG TB2: 500 | 30 days supply | Qty: 120 | Fill #0

## 2019-01-05 NOTE — Telephone Encounter (Signed)
Patient called regarding Metformin RX

## 2019-01-06 ENCOUNTER — Ambulatory Visit: Payer: BLUE CROSS/BLUE SHIELD | Admitting: Internal Medicine

## 2019-01-14 MED FILL — JARDIANCE 10 MG TABLET: 10 | 30 days supply | Qty: 30 | Fill #4

## 2019-01-14 MED FILL — CARVEDILOL 6.25 MG TABLET: 6.25 | 30 days supply | Qty: 90 | Fill #5

## 2019-01-14 MED FILL — TAMSULOSIN HCL 0.4 MG CAP: 0.4 | 30 days supply | Qty: 30 | Fill #3

## 2019-01-19 ENCOUNTER — Other Ambulatory Visit: Payer: Self-pay

## 2019-01-19 ENCOUNTER — Encounter (INDEPENDENT_AMBULATORY_CARE_PROVIDER_SITE_OTHER): Payer: BC Managed Care – PPO | Admitting: Ophthalmology

## 2019-01-19 ENCOUNTER — Telehealth: Payer: Self-pay

## 2019-01-19 DIAGNOSIS — H43813 Vitreous degeneration, bilateral: Secondary | ICD-10-CM

## 2019-01-19 DIAGNOSIS — D485 Neoplasm of uncertain behavior of skin: Secondary | ICD-10-CM | POA: Diagnosis not present

## 2019-01-19 DIAGNOSIS — H33302 Unspecified retinal break, left eye: Secondary | ICD-10-CM

## 2019-01-19 DIAGNOSIS — I10 Essential (primary) hypertension: Secondary | ICD-10-CM | POA: Diagnosis not present

## 2019-01-19 DIAGNOSIS — E11319 Type 2 diabetes mellitus with unspecified diabetic retinopathy without macular edema: Secondary | ICD-10-CM

## 2019-01-19 DIAGNOSIS — L821 Other seborrheic keratosis: Secondary | ICD-10-CM | POA: Diagnosis not present

## 2019-01-19 DIAGNOSIS — E113293 Type 2 diabetes mellitus with mild nonproliferative diabetic retinopathy without macular edema, bilateral: Secondary | ICD-10-CM | POA: Diagnosis not present

## 2019-01-19 DIAGNOSIS — D1801 Hemangioma of skin and subcutaneous tissue: Secondary | ICD-10-CM | POA: Diagnosis not present

## 2019-01-19 DIAGNOSIS — L814 Other melanin hyperpigmentation: Secondary | ICD-10-CM | POA: Diagnosis not present

## 2019-01-19 DIAGNOSIS — H35033 Hypertensive retinopathy, bilateral: Secondary | ICD-10-CM | POA: Diagnosis not present

## 2019-01-19 NOTE — Telephone Encounter (Signed)
Called pt to inform about Dr. Quin Hoop advice. LVM requesting returned call

## 2019-01-19 NOTE — Telephone Encounter (Signed)
Pt returned call. States he believes [facility] pharmacy sent this refill request erroneously. No further action required

## 2019-01-19 NOTE — Telephone Encounter (Signed)
Received notification via fax from [facility] requesting refill of Atorvastatin 40mg  tablets, qty 90, refill of 3, take 1 tablet by mouth daily. Reviewed pt medication list and did not see that this is a medication managed by Dr. Cruzita Lederer. Also noted that Dr. Aundra Dubin filled Rx for Crestor 09/18/17. LOV 08/27/18 with Dr. Cruzita Lederer did not mention anything about this medication or that she managed this medication. Dr. Cruzita Lederer is unavailable until August 17. This message routed to Dr. Kelton Pillar for further review and advice about refill request.

## 2019-01-20 ENCOUNTER — Other Ambulatory Visit (HOSPITAL_COMMUNITY): Payer: Self-pay

## 2019-02-04 ENCOUNTER — Other Ambulatory Visit (HOSPITAL_COMMUNITY): Payer: Self-pay | Admitting: *Deleted

## 2019-02-04 ENCOUNTER — Encounter (HOSPITAL_COMMUNITY): Payer: Self-pay

## 2019-02-04 DIAGNOSIS — I420 Dilated cardiomyopathy: Secondary | ICD-10-CM

## 2019-02-04 MED ORDER — ENTRESTO 49-51 MG PO TABS: 1.0000 | ORAL_TABLET | Freq: Two times a day (BID) | ORAL | 4 refills | Status: DC

## 2019-02-04 MED FILL — TRULICITY 1.5 MG/0.5 ML PEN: 1.5 | 28 days supply | Qty: 2 | Fill #10

## 2019-02-04 MED FILL — LANTUS SOLOSTAR 100 UNITS/M: 100 | 30 days supply | Qty: 12 | Fill #7

## 2019-02-04 MED FILL — SPIRONOLACTONE 25 MG TABS: 25 | 30 days supply | Qty: 30 | Fill #3

## 2019-02-04 MED FILL — metFORMIN HCL ER 500 MG TB2: 500 | 30 days supply | Qty: 120 | Fill #1

## 2019-02-04 MED FILL — ONE TOUCH VERIO TEST STRIP: 16 days supply | Qty: 50 | Fill #2

## 2019-02-05 ENCOUNTER — Telehealth: Payer: Self-pay

## 2019-02-05 ENCOUNTER — Other Ambulatory Visit: Payer: Self-pay

## 2019-02-08 ENCOUNTER — Telehealth: Payer: Self-pay

## 2019-02-08 ENCOUNTER — Encounter: Payer: Self-pay | Admitting: Internal Medicine

## 2019-02-08 ENCOUNTER — Ambulatory Visit (INDEPENDENT_AMBULATORY_CARE_PROVIDER_SITE_OTHER): Payer: BC Managed Care – PPO | Admitting: Internal Medicine

## 2019-02-08 ENCOUNTER — Other Ambulatory Visit: Payer: Self-pay

## 2019-02-08 VITALS — BP 136/74 | HR 52 | Ht 71.0 in | Wt 223.0 lb

## 2019-02-08 DIAGNOSIS — Z95810 Presence of automatic (implantable) cardiac defibrillator: Secondary | ICD-10-CM | POA: Diagnosis not present

## 2019-02-08 DIAGNOSIS — I420 Dilated cardiomyopathy: Secondary | ICD-10-CM | POA: Diagnosis not present

## 2019-02-08 DIAGNOSIS — I2510 Atherosclerotic heart disease of native coronary artery without angina pectoris: Secondary | ICD-10-CM

## 2019-02-08 LAB — CUP PACEART INCLINIC DEVICE CHECK
Battery Voltage: 2.51 V
Brady Statistic AP VP Percent: 0 %
Brady Statistic AP VS Percent: 0 %
Brady Statistic AS VP Percent: 0.08 %
Brady Statistic AS VS Percent: 99.92 %
Brady Statistic RA Percent Paced: 0 %
Brady Statistic RV Percent Paced: 0.09 %
Date Time Interrogation Session: 20200824092640
HighPow Impedance: 285 Ohm
HighPow Impedance: 41 Ohm
HighPow Impedance: 52 Ohm
Implantable Lead Implant Date: 20111201
Implantable Lead Implant Date: 20111201
Implantable Lead Location: 753859
Implantable Lead Location: 753860
Implantable Lead Model: 5076
Implantable Lead Model: 6947
Implantable Pulse Generator Implant Date: 20111201
Lead Channel Impedance Value: 361 Ohm
Lead Channel Impedance Value: 418 Ohm
Lead Channel Pacing Threshold Amplitude: 1 V
Lead Channel Pacing Threshold Amplitude: 1 V
Lead Channel Pacing Threshold Pulse Width: 0.4 ms
Lead Channel Pacing Threshold Pulse Width: 0.4 ms
Lead Channel Sensing Intrinsic Amplitude: 1.25 mV
Lead Channel Sensing Intrinsic Amplitude: 2.5 mV
Lead Channel Setting Pacing Amplitude: 0.5 V
Lead Channel Setting Pacing Pulse Width: 0.4 ms
Lead Channel Setting Sensing Sensitivity: 0.3 mV

## 2019-02-08 LAB — CBC
Hematocrit: 41.5 % (ref 37.5–51.0)
Hemoglobin: 13.7 g/dL (ref 13.0–17.7)
MCH: 30 pg (ref 26.6–33.0)
MCHC: 33 g/dL (ref 31.5–35.7)
MCV: 91 fL (ref 79–97)
Platelets: 242 10*3/uL (ref 150–450)
RBC: 4.57 x10E6/uL (ref 4.14–5.80)
RDW: 12.8 % (ref 11.6–15.4)
WBC: 7.5 10*3/uL (ref 3.4–10.8)

## 2019-02-08 LAB — BASIC METABOLIC PANEL
BUN/Creatinine Ratio: 24 (ref 10–24)
BUN: 33 mg/dL — ABNORMAL HIGH (ref 8–27)
CO2: 23 mmol/L (ref 20–29)
Calcium: 9.1 mg/dL (ref 8.6–10.2)
Chloride: 99 mmol/L (ref 96–106)
Creatinine, Ser: 1.38 mg/dL — ABNORMAL HIGH (ref 0.76–1.27)
GFR calc Af Amer: 59 mL/min/{1.73_m2} — ABNORMAL LOW (ref >59)
GFR calc non Af Amer: 51 mL/min/{1.73_m2} — ABNORMAL LOW (ref >59)
Glucose: 227 mg/dL — ABNORMAL HIGH (ref 65–99)
Potassium: 4.9 mmol/L (ref 3.5–5.2)
Sodium: 138 mmol/L (ref 134–144)

## 2019-02-08 NOTE — Progress Notes (Signed)
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 Caryl [name] to consider ICD system extraction. He is a 73 yo man with a non-ischemic CM and PVC's who underwent ICD insertion in 2001. He has done well in the interim and his EF has improved and he has not received any ICD therapies. The patient has reached end of service and presents to discuss system extraction. He would like to have his ICD removed.  Allergies  Allergen Reactions  . Lisinopril     angioedema     Current Outpatient Medications  Medication Sig Dispense Refill  . aspirin EC 325 MG tablet Take 1 tablet (325 mg total) 2 (two) times daily after a meal by mouth. Take x 1 month post op to decrease risk of blood clots. 60 tablet 0  . Blood Glucose Monitoring Suppl (ONETOUCH VERIO) w/Device KIT Use to check blood sugar 3 times a day. 1 kit 0  . carvedilol (COREG) 6.25 MG tablet Take 6.25 mg by mouth 2 (two) times daily with a meal.    . Dulaglutide (TRULICITY) 1.5 PQ/3.3AQ SOPN Inject 1.5 mg into the skin once a week. 12 pen 3  . empagliflozin (JARDIANCE) 10 MG TABS tablet Take 10 mg by mouth daily. 30 tablet 11  . glucose blood (ONETOUCH VERIO) test strip Use to check blood sugar 3 times a day. 200 each 12  . Insulin Glargine (LANTUS SOLOSTAR) 100 UNIT/ML Solostar Pen Inject 38 Units into the skin at bedtime. 45 mL 4  . Lancets MISC Glucose monitor to check blood sugars tid dx code E11.9 brand per insurance coverage 300 each 1  . metFORMIN (GLUCOPHAGE-XR) 500 MG 24 hr tablet Take 4 tablets (2,000 mg total) by mouth daily with breakfast. 360 tablet 1  . rosuvastatin (CRESTOR) 10 MG tablet Take 1 tablet (10 mg total) by mouth daily. 30 tablet 3  . sacubitril-valsartan (ENTRESTO) 49-51 MG Take 1 tablet by mouth 2 (two) times daily. 180 tablet 4  . spironolactone (ALDACTONE) 25 MG tablet TAKE 1 TABLET BY MOUTH ONCE DAILY 30 tablet 5  . tamsulosin (FLOMAX) 0.4 MG CAPS capsule Take 0.4 mg by mouth.    Marland Kitchen UNIFINE PENTIPS 32G X 4 MM MISC USE  TWICE DAILY WITH LANTUS AND VICTOZA 200 each 11   No current facility-administered medications for this visit.      Past Medical History:  Diagnosis Date  . AICD (automatic cardioverter/defibrillator) present    "on last leg not going to replace"  . Arthritis    "thumbs, neck, knees" (06/01/2013)  . Cardiomyopathy, dilated, nonischemic ([place]) 10/20/2012  . Cataract   . CHF (congestive heart failure) ([place])    "mild" (06/01/2013)  . Dysrhythmia   . Erectile dysfunction   . GERD (gastroesophageal reflux disease)   . Hypertension   . Hypogonadism male   . Hypovitaminosis D   . 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 Presence of permanent cardiac pacemaker   . Sleep apnea    cpap  . Type II diabetes mellitus ([place])   . Ventricular tachycardia ([place])    observed during stress testing, subsequent VT on EPS    ROS:   All systems reviewed and negative except as noted in the HPI.   Past Surgical History:  Procedure Laterality Date  . ABLATION  06-01-2013   PVC's ablated along basal inferoseptal RV by Dr Rayann Heman  . APPENDECTOMY  1969  . CARDIAC CATHETERIZATION  04/2010  . CARDIAC DEFIBRILLATOR PLACEMENT  05/09/2010   MDT ICD implanted in [place] by Dr Ky Barban  . CATARACT EXTRACTION W/ INTRAOCULAR LENS  IMPLANT, BILATERAL Bilateral   . KNEE ARTHROSCOPY Left 1984  . LUMBAR LAMINECTOMY  ~ 2004  . TONSILLECTOMY AND ADENOIDECTOMY  1972  . TOTAL KNEE ARTHROPLASTY Left 04/28/2017  . TOTAL KNEE ARTHROPLASTY Left 04/28/2017   Procedure: TOTAL KNEE ARTHROPLASTY LEFT;  Surgeon: Dorna Leitz, MD;  Location: [place];  Service: Orthopedics;  Laterality: Left;  . V-TACH ABLATION N/A 06/01/2013   Procedure: V-TACH ABLATION;  Surgeon: Coralyn Mark, MD;  Location: [facility] CATH LAB;  Service: Cardiovascular;  Laterality: N/A;  . VASECTOMY    . VENTRICULAR ABLATION SURGERY  06/01/2013     Family History  Problem Relation Age of Onset  . Alcohol  abuse Mother   . COPD Mother   . Depression Mother   . Heart disease Mother   . Hypertension Mother   . Stroke Mother   . COPD Father   . Heart disease Father   . Hypertension Father   . Stroke Father   . Cancer Father   . Diabetes Sister   . Mental retardation Sister   . Learning disabilities Sister   . Hypertension Sister   . Alcohol abuse Brother   . Drug abuse Brother   . Heart disease Brother   . Hypertension Brother   . Coronary artery disease Brother      Social History   Socioeconomic History  . Marital status: Married    Spouse name: Not on file  . Number of children: Not on file  . Years of education: Not on file  . Highest education level: Not on file  Occupational History  . Not on file  Social Needs  . Financial resource strain: Not on file  . Food insecurity    Worry: Not on file    Inability: Not on file  . Transportation needs    Medical: Not on file    Non-medical: Not on file  Tobacco Use  . Smoking status: Never Smoker  . Smokeless tobacco: Never Used  Substance and Sexual Activity  . Alcohol use: Yes    Alcohol/week: 2.0 standard drinks    Types: 1 Glasses of wine, 1 Cans of beer per week  . Drug use: No  . Sexual activity: Yes  Lifestyle  . Physical activity    Days per week: Not on file    Minutes per session: Not on file  . Stress: Not on file  Relationships  . Social Herbalist on phone: Not on file    Gets together: Not on file    Attends religious service: Not on file    Active member of club or organization: Not on file    Attends meetings of clubs or organizations: Not on file    Relationship status: Not on file  . Intimate partner violence    Fear of current or ex partner: Not on file    Emotionally abused: Not on file    Physically abused: Not on file    Forced sexual activity: Not on file  Other Topics Concern  . 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    Moved here from [facility] 1.5 years ago     BP 136/74   Pulse (!) 52   Ht 5' 11" (1.803 m)   Wt  223 lb (101.2 kg)   SpO2 97%   BMI 31.10 kg/m   Physical Exam:  Well appearing 73 yo man, NAD HEENT: Unremarkable Neck:  6 cm JVD, no thyromegally Lymphatics:  No adenopathy Back:  No CVA tenderness Lungs:  Clear with no wheezes HEART:  Regular rate rhythm, no murmurs, no rubs, no clicks Abd:  soft, positive bowel sounds, no organomegally, no rebound, no guarding Ext:  2 plus pulses, no edema, no cyanosis, no clubbing Skin:  No rashes no nodules Neuro:  CN II through XII intact, motor grossly intact   DEVICE  Normal device function.  See PaceArt for details. EOS  Assess/Plan: 1. ICD - I have discussed the pro's and con's,risks/benefits/goals/expectations of ICD system extraction with the patient and he would like to proceed. We will schedule this in the coming weeks as his schedule allows.  2. PVC"s - these are much improved.  3. Chronic systolic heart failure - he has had normalization of his LV function by echo. I spent 25 minutes including 50% face to face time with the patient discussing the extraction.  Paul [name], M.D.

## 2019-02-08 NOTE — Patient Instructions (Signed)
Medication Instructions:  Your physician recommends that you continue on your current medications as directed. Please refer to the Current Medication list given to you today.  If you need a refill on your cardiac medications before your next appointment, please call your pharmacy.    Lab work: TODAY - CBC, BMET If you have labs (blood work) drawn today and your tests are completely normal, you will receive your results only by: Marland Kitchen MyChart Message (if you have MyChart) OR . A paper copy in the mail If you have any lab test that is abnormal or we need to change your treatment, we will call you to review the results.     Follow-Up: Your physician recommends that you schedule a follow-up appointment in: your follow-up will be scheduled following your procedure

## 2019-02-11 ENCOUNTER — Other Ambulatory Visit (HOSPITAL_COMMUNITY): Payer: Self-pay

## 2019-02-11 DIAGNOSIS — I420 Dilated cardiomyopathy: Secondary | ICD-10-CM

## 2019-02-11 MED ORDER — ENTRESTO 49-51 MG PO TABS: 1.0000 | ORAL_TABLET | Freq: Two times a day (BID) | ORAL | 4 refills | Status: DC

## 2019-02-16 MED FILL — JARDIANCE 10 MG TABLET: 10 | 30 days supply | Qty: 30 | Fill #5

## 2019-02-16 MED FILL — CARVEDILOL 6.25 MG TABLET: 6.25 | 30 days supply | Qty: 90 | Fill #6

## 2019-02-16 MED FILL — TAMSULOSIN HCL 0.4 MG CAP: 0.4 | 30 days supply | Qty: 30 | Fill #4

## 2019-02-17 ENCOUNTER — Telehealth (HOSPITAL_COMMUNITY): Payer: Self-pay | Admitting: Pharmacy Technician

## 2019-02-17 NOTE — Telephone Encounter (Signed)
Received notification from [employer] ([employer]) that prior authorization for Paul [name] is required.  PA could not be submitted on CMM. Called [employer] at [phone] and submitted PA over the phone. PA #: [id number]   PA was denied due to patient having a history of angioedema due to an ACE or ARB.   Will have to appeal in writing with the providers signature, patients signature saying that we can appeal on his behalf, supporting documentation and a copy of the denial.  Fax Number for appeals: [phone]  Phone Number: [phone]  Spoke with patient and he is going to come in next week and see Dr. Aundra Dubin and decide if he wants to continue on Entresto. Informed patient that I have the appeal letter for him to sign so that we may appeal on his behalf if that is what he and the provider decide to do.  Charlann Boxer, CPhT

## 2019-02-24 ENCOUNTER — Other Ambulatory Visit: Payer: Self-pay

## 2019-02-24 ENCOUNTER — Encounter (HOSPITAL_COMMUNITY): Payer: Self-pay | Admitting: Cardiology

## 2019-02-24 ENCOUNTER — Ambulatory Visit (HOSPITAL_COMMUNITY)
Admission: RE | Admit: 2019-02-24 | Discharge: 2019-02-24 | Disposition: A | Discharge status: Home / Self Care | Payer: BC Managed Care – PPO | Source: Ambulatory Visit | Attending: Cardiology | Admitting: Cardiology

## 2019-02-24 VITALS — BP 128/90 | HR 77 | Wt 221.6 lb

## 2019-02-24 DIAGNOSIS — H04123 Dry eye syndrome of bilateral lacrimal glands: Secondary | ICD-10-CM | POA: Diagnosis not present

## 2019-02-24 DIAGNOSIS — I428 Other cardiomyopathies: Secondary | ICD-10-CM | POA: Diagnosis not present

## 2019-02-24 DIAGNOSIS — Z86718 Personal history of other venous thrombosis and embolism: Secondary | ICD-10-CM | POA: Insufficient documentation

## 2019-02-24 DIAGNOSIS — Z888 Allergy status to other drugs, medicaments and biological substances status: Secondary | ICD-10-CM | POA: Diagnosis not present

## 2019-02-24 DIAGNOSIS — Z7901 Long term (current) use of anticoagulants: Secondary | ICD-10-CM | POA: Insufficient documentation

## 2019-02-24 DIAGNOSIS — Z7902 Long term (current) use of antithrombotics/antiplatelets: Secondary | ICD-10-CM | POA: Insufficient documentation

## 2019-02-24 DIAGNOSIS — Z833 Family history of diabetes mellitus: Secondary | ICD-10-CM | POA: Diagnosis not present

## 2019-02-24 DIAGNOSIS — N183 Chronic kidney disease, stage 3 (moderate): Secondary | ICD-10-CM | POA: Diagnosis not present

## 2019-02-24 DIAGNOSIS — I2510 Atherosclerotic heart disease of native coronary artery without angina pectoris: Secondary | ICD-10-CM | POA: Diagnosis not present

## 2019-02-24 DIAGNOSIS — Z809 Family history of malignant neoplasm, unspecified: Secondary | ICD-10-CM | POA: Insufficient documentation

## 2019-02-24 DIAGNOSIS — Z961 Presence of intraocular lens: Secondary | ICD-10-CM | POA: Diagnosis not present

## 2019-02-24 DIAGNOSIS — Z823 Family history of stroke: Secondary | ICD-10-CM | POA: Insufficient documentation

## 2019-02-24 DIAGNOSIS — G4733 Obstructive sleep apnea (adult) (pediatric): Secondary | ICD-10-CM | POA: Diagnosis not present

## 2019-02-24 DIAGNOSIS — H33312 Horseshoe tear of retina without detachment, left eye: Secondary | ICD-10-CM | POA: Diagnosis not present

## 2019-02-24 DIAGNOSIS — E1122 Type 2 diabetes mellitus with diabetic chronic kidney disease: Secondary | ICD-10-CM | POA: Insufficient documentation

## 2019-02-24 DIAGNOSIS — Z79899 Other long term (current) drug therapy: Secondary | ICD-10-CM | POA: Diagnosis not present

## 2019-02-24 DIAGNOSIS — Z8249 Family history of ischemic heart disease and other diseases of the circulatory system: Secondary | ICD-10-CM | POA: Diagnosis not present

## 2019-02-24 DIAGNOSIS — H43813 Vitreous degeneration, bilateral: Secondary | ICD-10-CM | POA: Diagnosis not present

## 2019-02-24 DIAGNOSIS — E119 Type 2 diabetes mellitus without complications: Secondary | ICD-10-CM | POA: Diagnosis not present

## 2019-02-24 DIAGNOSIS — I5022 Chronic systolic (congestive) heart failure: Secondary | ICD-10-CM | POA: Diagnosis present

## 2019-02-24 DIAGNOSIS — E785 Hyperlipidemia, unspecified: Secondary | ICD-10-CM | POA: Diagnosis not present

## 2019-02-24 DIAGNOSIS — I130 Hypertensive heart and chronic kidney disease with heart failure and stage 1 through stage 4 chronic kidney disease, or unspecified chronic kidney disease: Secondary | ICD-10-CM | POA: Diagnosis present

## 2019-02-24 DIAGNOSIS — H02831 Dermatochalasis of right upper eyelid: Secondary | ICD-10-CM | POA: Diagnosis not present

## 2019-02-24 DIAGNOSIS — H4911 Fourth [trochlear] nerve palsy, right eye: Secondary | ICD-10-CM | POA: Diagnosis not present

## 2019-02-24 DIAGNOSIS — Z95810 Presence of automatic (implantable) cardiac defibrillator: Secondary | ICD-10-CM | POA: Diagnosis not present

## 2019-02-24 DIAGNOSIS — I493 Ventricular premature depolarization: Secondary | ICD-10-CM

## 2019-02-24 DIAGNOSIS — Z7982 Long term (current) use of aspirin: Secondary | ICD-10-CM | POA: Diagnosis not present

## 2019-02-24 DIAGNOSIS — H02834 Dermatochalasis of left upper eyelid: Secondary | ICD-10-CM | POA: Diagnosis not present

## 2019-02-24 DIAGNOSIS — Z794 Long term (current) use of insulin: Secondary | ICD-10-CM | POA: Insufficient documentation

## 2019-02-24 DIAGNOSIS — Z955 Presence of coronary angioplasty implant and graft: Secondary | ICD-10-CM | POA: Insufficient documentation

## 2019-02-24 LAB — HM DIABETES EYE EXAM

## 2019-02-24 MED ORDER — LOSARTAN POTASSIUM 25 MG PO TABS: 25.0000 mg | ORAL_TABLET | Freq: Every day | ORAL | 3 refills | Status: DC

## 2019-02-24 MED ORDER — ASPIRIN 81 MG PO TBEC: 81.0000 mg | DELAYED_RELEASE_TABLET | Freq: Every day | ORAL | 12 refills | Status: DC

## 2019-02-24 MED FILL — LOSARTAN POTASSIUM 25 MG TA: 25 | 30 days supply | Qty: 30 | Fill #0

## 2019-02-24 MED FILL — ASPIRIN 81 MG TBEC: 81 | 30 days supply | Qty: 30 | Fill #0

## 2019-02-24 NOTE — Patient Instructions (Signed)
DECREASE Aspirin to 81mg  daily  Labs in 2 weeks.  Please fax results to [phone]. We will only contact you if something comes back abnormal or we need to make some changes. Otherwise no news is good news!  Your physician recommends that you schedule a follow-up appointment in: March 2021 with an ECHO.  Our office will call you to schedule this appointment.   Your physician has requested that you have an echocardiogram. Echocardiography is a painless test that uses sound waves to create images of your heart. It provides your doctor with information about the size and shape of your heart and how well your heart's chambers and valves are working. This procedure takes approximately one hour. There are no restrictions for this procedure.  At the [facility], you and your health needs are our priority. As part of our continuing mission to provide you with exceptional heart care, we have created designated Provider Care Teams. These Care Teams include your primary Cardiologist (physician) and Advanced Practice Providers (APPs- Physician Assistants and Nurse Practitioners) who all work together to provide you with the care you need, when you need it.   You may see any of the following providers on your designated Care Team at your next follow up: Marland Kitchen Dr Glori Bickers . Dr Loralie Champagne . Darrick Grinder, NP   Please be sure to bring in all your medications bottles to every appointment.

## 2019-02-24 NOTE — Progress Notes (Signed)
Date:  02/24/2019   ID:  Paul [name], DOB Sep 07, 1945, MRN [id number]  Provider location: [place] [facility] Type of Visit: Established patient   PCP:  Patient, No Pcp Per  Cardiologist:  Dr. Aundra Dubin EP: Dr. Caryl Comes   History of Present Illness: Paul [name] is a 73 y.o. 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 Patient has had a cardiomyopathy known since around 2011.  Cath at that time showed nonobstructive CAD but EF 25%.  He got a Medtronic ICD.  Initial workup was in [place], and he subsequently moved to [place].  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    He has been noted to have frequent PVCs, at one point up to 1/3 of beats.  VT ablation did not help much, but mexiletine seemed to cut back on PVC frequency.  He is no longer taking mexiletine. Dr Ouida Sills felt like his PVCs increased again.  He was seen at [facility] for possible VT ablation.  During that visit, his ICD was interrogated and PVCs were noted to be up to > 1000/hr with short runs NSVT.  Echo was repeated in 1/16 and showed EF down to 30-35%.  Also of note, patient had an untriggered DVT in 12/15 and was on Xarelto for a time but is no longer taking it. He was diagnosed with OSA and is on CPAP.   He had VT mapping at [facility] in 4/16, mapped to inferior septum.  He had LHC the next day, showing 90% distal LAD and 90% PDA.  He had DES to both lesions.  He was sent home on ASA 325, Plavix 75, and Xarelto 20.  Plan was to return 1 year after DES placement to have epicardial VT ablation (off Plavix).    In 6/17, he had unsuccessful epicardial/endocardial VT ablation at [facility]. Suspect mid-myocardial focus.   Echo in 10/18 showed EF 40-45%.  Echo in 3/20 showed EF improved to normal range, 55-60% with normal RV.   Zio patch in 6/20 showed PVCs down to 2.5% of total beats.   He returns for followup of CHF.  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  No palpitations.  No significant exertional dyspnea.  No chest pain.  No orthopnea/PND.  No lightheadedness.  He is off Entresto now.  His ICD is at end of life, plan for device extraction with Dr. Lovena Le.  Weight is down 8 lbs.   Labs (10/15): K 4.7, creatinine 1.6, LDL 84, HDL 35 Labs (12/15): K 4.8, creatinine 1.23 Labs (2/16): K 4.5, creatinine 1.4 Labs (8/17): LDL 103 Labs (9/18): LDL 87, HDL 30, TGs 218, K 4.1, creatinine 1.37, hgb 14 Labs (11/18): hgb 11.5, K 4.8, creatinine 1.48 Labs (2/20): K 5.1, creatinine 1.57, hgb 13.5, LDL 63 Labs (8/20): K 4.9, creatinine 1.38, hgb 13.7  ECG (personally reviewed, 3/20): NSR, RBBB, LAFB  PMH: 1. Nonischemic cardiomyopathy: Diagnosed in [place] in 2011.  Possibly related to frequent PVCs. Medtronic ICD placed in 2011.  Echo (6/14) with EF 35%.  Echo (10/15) with EF 40-45% (difficult with PVCs), mildly dilated RV with moderately decreased RV systolic function.  Echo (1/16) with EF 30-35%, mildly dilated LV.   - Echo (10/18): EF 40-45%, normal RV size and systolic function.  - Echo (3/20): EF 55-60%, normal RV.  - h/o angioedema with ACEI.  2. PVCs: Frequent, at one point up to 1/3 of  total beats.  Patient had endocardial ablation in 6/14 that was not successful.  However, PVC count dropped with initiation of mexiletine.  He is now off mexiletine.  - Combined epicardial/endocardial ablation 6/17 at [facility] was unsuccessful, likely due to mid-myocardial focus.  - Holter (7/19): 15% PVCs.  - Zio patch (6/20): 2.5% PVCs, 5.5% PACs, no atrial fibrillation.  3. HTN 4. Type II diabetes 5. CAD:  LHC (1/11) with 40-50% mLAD stenosis, 90% small OM1 stenosis, EF 25%.  LHC (4/16) witih 90% dLAD and 90% PDA, both treated with DES.  6. GERD 7. Appendectomy 8. Lumbar laminectomy 9. CKD 10. Hyperlipidemia: Myalgias with Crestor.  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  12. OSA:  CPAP 13. CKD: Stage 3.   Current Outpatient Medications   Medication Sig Dispense Refill   Blood Glucose Monitoring Suppl (ONETOUCH VERIO) w/Device KIT Use to check blood sugar 3 times a day. 1 kit 0   carvedilol (COREG) 6.25 MG tablet Take 6.25 mg by mouth 2 (two) times daily with a meal.     Dulaglutide (TRULICITY) 1.5 NG/2.9BM SOPN Inject 1.5 mg into the skin once a week. 12 pen 3   empagliflozin (JARDIANCE) 10 MG TABS tablet Take 10 mg by mouth daily. 30 tablet 11   glucose blood (ONETOUCH VERIO) test strip Use to check blood sugar 3 times a day. 200 each 12   Insulin Glargine (LANTUS SOLOSTAR) 100 UNIT/ML Solostar Pen Inject 38 Units into the skin at bedtime. 45 mL 4   Lancets MISC Glucose monitor to check blood sugars tid dx code E11.9 brand per insurance coverage 300 each 1   metFORMIN (GLUCOPHAGE-XR) 500 MG 24 hr tablet Take 4 tablets (2,000 mg total) by mouth daily with breakfast. 360 tablet 1   rosuvastatin (CRESTOR) 10 MG tablet Take 1 tablet (10 mg total) by mouth daily. 30 tablet 3   spironolactone (ALDACTONE) 25 MG tablet TAKE 1 TABLET BY MOUTH ONCE DAILY 30 tablet 5   tamsulosin (FLOMAX) 0.4 MG CAPS capsule Take 0.4 mg by mouth.     UNIFINE PENTIPS 32G X 4 MM MISC USE TWICE DAILY WITH LANTUS AND VICTOZA 200 each 11   aspirin (ASPIRIN 81) 81 MG EC tablet Take 1 tablet (81 mg total) by mouth daily. Swallow whole. 30 tablet 12   losartan (COZAAR) 25 MG tablet Take 1 tablet (25 mg total) by mouth daily. 90 tablet 3   No current facility-administered medications for this encounter.     Allergies:   Lisinopril   Social History:  The patient  reports that he has never smoked. He has never used smokeless tobacco. He reports current alcohol use of about 2.0 standard drinks of alcohol per week. He reports that he does not use drugs.   Family History:  The patient's family history includes Alcohol abuse in his brother and mother; COPD in his father and mother; Cancer in his father; Coronary artery disease in his brother;  Depression in his mother; Diabetes in his sister; Drug abuse in his brother; Heart disease in his brother, father, and mother; Hypertension in his brother, father, mother, and sister; Learning disabilities in his sister; Mental retardation in his sister; Stroke in his father and mother.   ROS:  Please see the history of present illness.   All other systems are personally reviewed and negative.   Exam:   BP 128/90    Pulse 77    Wt 100.5 kg (221 lb 9.6 oz)    SpO2 97%  BMI 30.91 kg/m  General: NAD Neck: No JVD, no thyromegaly or thyroid nodule.  Lungs: Clear to auscultation bilaterally with normal respiratory effort. CV: Nondisplaced PMI.  Heart regular S1/S2, no S3/S4, no murmur.  1+ ankle edema.  No carotid bruit.  Normal pedal pulses.  Abdomen: Soft, nontender, no hepatosplenomegaly, no distention.  Skin: Intact without lesions or rashes.  Neurologic: Alert and oriented x 3.  Psych: Normal affect. Extremities: No clubbing or cyanosis.  HEENT: Normal.   Recent Labs: 02/08/2019: BUN 33; Creatinine, Ser 1.38; Hemoglobin 13.7; Platelets 242; Potassium 4.9; Sodium 138  Personally reviewed   Wt Readings from Last 3 Encounters:  02/24/19 100.5 kg (221 lb 9.6 oz)  02/08/19 101.2 kg (223 lb)  12/07/18 97.5 kg (215 lb)     ASSESSMENT AND PLAN:  1. Chronic systolic CHF: Primarily nonischemic cardiomyopathy.  St Jude ICD.  Possibly PVC-related as there seemed to have been improvement in EF with decreasing PVC burden (2.5% on last Zio patch monitor in 6/20). He had PCI in 4/16 to distal LAD and PDA, but suspect that his cardiomyopathy is not predominantly ischemic (pre-dated coronary disease and out of proportion to coronary disease).  Echo in 10/18 showed EF back to 40-45%. Echo in 3/20 showed EF up to normal range, 55-60% with normal RV.  NYHA class I-II symptoms.  Weight lower.  - He is off Entresto, apparently he had angioedema with lisinopril in the past so should not restart Entresto given  significant risk of cross-reactivity.  ARBs do not have a high risk of angioedema despite angioedema with ACEI and generally are well-tolerated with close followup.  I will start him on losartan 25 mg daily with BMET in 2 wks.  - Continue spironolactone 25 mg daily.   - He is taking Lasix prn.  - Continue Coreg 6.25 mg bid.   - Continue empagliflozin.  - After deliberation, he has elected to have his ICD removed (at end of life).  I think that this is reasonable with normalized EF and decreased PVCs.  Plan for later this month with Dr. Lovena Le.  - Repeat echo in 3/21 to make sure EF remains normal.  2. 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 Epicardial/endocardial ablation in 6/17 at [facility] was unsuccessful, likely due to mid-myocardial focus.  However, Zio patch monitor in 6/20 showed PVCs down to 2.5% of total beats. 3. CKD:  Stage 3, recent creatinine was 1.38.  - Should avoid all NSAIDs, discussed this again today.     4. CAD: Nonobstructive CAD on 2011 cath but 4/16 cath at [facility] with distal LAD and PDA stenoses treated with DES.  As above, do not think coronary disease can explain extent of his cardiomyopathy. He is currently on ASA and Crestor.  - Can decrease ASA to 81 mg daily.   - Continue Crestor 10 mg daily, will check lipids with BMET in 2 wks.  5. DVT: Spontaneous (no trigger).  He is now off Xarelto.  6. Type II diabetes: He sees Dr. Cruzita Lederer for endocrinology.  He is on empagliflozin.   Followup in 6 months given normalization of EF.   Signed, Loralie Champagne, MD  02/24/2019  [facility] [address] [facility] [place] [address] [phone] (office) [phone] (fax)

## 2019-02-25 ENCOUNTER — Ambulatory Visit (INDEPENDENT_AMBULATORY_CARE_PROVIDER_SITE_OTHER): Payer: BC Managed Care – PPO | Admitting: Internal Medicine

## 2019-02-25 ENCOUNTER — Encounter: Payer: Self-pay | Admitting: Internal Medicine

## 2019-02-25 VITALS — BP 126/82 | HR 82 | Ht 71.0 in | Wt 221.0 lb

## 2019-02-25 DIAGNOSIS — E785 Hyperlipidemia, unspecified: Secondary | ICD-10-CM

## 2019-02-25 DIAGNOSIS — I2510 Atherosclerotic heart disease of native coronary artery without angina pectoris: Secondary | ICD-10-CM

## 2019-02-25 DIAGNOSIS — E1142 Type 2 diabetes mellitus with diabetic polyneuropathy: Secondary | ICD-10-CM

## 2019-02-25 DIAGNOSIS — E1165 Type 2 diabetes mellitus with hyperglycemia: Secondary | ICD-10-CM

## 2019-02-25 DIAGNOSIS — E291 Testicular hypofunction: Secondary | ICD-10-CM

## 2019-02-25 LAB — POCT GLYCOSYLATED HEMOGLOBIN (HGB A1C): Hemoglobin A1C: 6.8 % — AB (ref 4.0–5.6)

## 2019-02-25 NOTE — Addendum Note (Signed)
Addended by: Cardell Peach I on: 02/25/2019 11:09 AM   Modules accepted: Orders

## 2019-02-25 NOTE — Progress Notes (Signed)
He was previously on AndroGelPatient ID: Paul [name], male   DOB: September 25, 1945, 73 y.o.   MRN: [id number]  HPI: Paul [name] is a 73 y.o.-year-old male, returning for f/u for DM2, dx in 2001, uncontrolled, with complications (non-iCMP - CHF, CKD, mild PN, ED, DR).  Last visit 6 mo ago.  DM2: Reviewed HbA1c levels: Lab Results  Component Value Date   HGBA1C 7.3 07/23/2018   HGBA1C 8.0 (H) 04/25/2017   HGBA1C 8.5 01/20/2016  09/23/2014: 8.8%  Pt is on a regimen of: - Metformin XR 2000 mg in a.m. >> stopped after last visit  stopped due to increased urination and dehydration >> Jardiance 10 (started 08/2018) - Victoza 1.8  mg daily in am >> Trulicity 1.5 mg - Lantus 38 >> 40 >> 36-38 units at bedtime   Pt checks his sugars 3x a day: - am: 120-140 >> 87, 110-180 >> 56, 63-189 >> ave 105; 56, 76-176 - 2h after b'fast: n/c  - before lunch: n/c >> 221 (overcorrection of 56) - 2h after lunch: n/c - before dinner: 274 >> n/c >> 85-179 >> 79-134 >> n/c - 2h after dinner: n/c - bedtime:  123-182, 228 >> 117-148, 229 >> [place] - nighttime: n/c >> 65, 70s 2x a week >> n/c Lowest sugar was 65 >> 70s ( 3 am >> drank OJ) >> 56 (he is not sure why) >> 56; he has hypoglycemia awareness in the 70s. Highest sugar was 200s >> 228 >> 229 >> 176.  Glucometer: OneTouch Ultra  Pt's meals are: - Breakfast: cereals + blueberries - Lunch: chinese food or sandwich, fruit - Dinner: meat + veggies or salad - Snacks: no  He used Nutrisystem 2x before >> lost 40 lbs.  -+ CKD: Lab Results  Component Value Date   BUN 33 (H) 02/08/2019   CREATININE 1.38 (H) 02/08/2019  07/23/2018: 32/1.57, GFR 44, glucose 142 02/19/2017: 19/1.37, GFR 52, glucose 143 09/23/2014: 13/1.1, GFR >60 Angioedema with ACEI.  She is [name] was recently denied coverage by insurance 02/19/2017: ACR 247 No results found for: MICRALBCREAT  -+ HL; last set of lipids: 07/23/2018: 117/135/32/63 Lab Results  Component Value  Date   CHOL 181 09/18/2017   HDL 29 (L) 09/18/2017   LDLCALC 89 09/18/2017   TRIG 313 (H) 09/18/2017   CHOLHDL 6.2 09/18/2017  02/19/2017: 149/218/30/87  09/2014: 158/157/25/100 On Crestor 10.  Previously on Lipitor 40.. 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  Dr. Zigmund Daniel. - + numbness and tingling in his L foot  H/o hypogonadism He was previously on AndroGel but this stopped being covered >> he had to stop the medication >> testosterone level was normal afterwards.  07/23/2018: Free testosterone 32.7 (6-73) Component     Latest Ref Rng & Units 01/20/2016  Testosterone,Total,LC/MS/MS     250 - 1,100 ng/dL 336  Testosterone, Free     35.0 - 155.0 pg/mL 66.2  Sex Hormone Binding Glob.     22 - 77 nmol/L 27   PSA levels were normal Lab Results  Component Value Date   PSA 0.9 01/20/2016   PSA 1.50 07/06/2015   PSA 1.16 10/16/2012   Patient has a history of trigeminy, status post endocardial ablation at [facility]. He had an epicardial ablation in 08/2014. During that time, he had 2 stents latest, and the ablation procedure was postponed. He is followed by Dr. Dwana Curd.  He has OSA-on CPAP. H/o DVT L leg >> more swollen. He continues on Coreg, Lasix,  spironolactone.  He is seen in the CHF clinic.  His CHF improves recently.  He will have R TKR in the next few mo.  ROS: Constitutional: no weight gain/no weight loss, no fatigue, no subjective hyperthermia, no subjective hypothermia Eyes: no blurry vision, no xerophthalmia ENT: no sore throat, no nodules palpated in neck, no dysphagia, no odynophagia, no hoarseness Cardiovascular: no CP/no SOB/no palpitations/+ leg swelling Respiratory: no cough/no SOB/no wheezing Gastrointestinal: no N/no V/no D/no C/no acid reflux Musculoskeletal: no muscle aches/no joint aches Skin: no rashes, no hair loss Neurological: no tremors/+ numbness/+ tingling/no dizziness  I reviewed pt's medications, allergies, PMH, social hx, family hx, and  changes were documented in the history of present illness. Otherwise, unchanged from my initial visit note.  Past Medical History:  Diagnosis Date  . AICD (automatic cardioverter/defibrillator) present    "on last leg not going to replace"  . Arthritis    "thumbs, neck, knees" (06/01/2013)  . Cardiomyopathy, dilated, nonischemic ([place]) 10/20/2012  . Cataract   . CHF (congestive heart failure) ([place])    "mild" (06/01/2013)  . Dysrhythmia   . Erectile dysfunction   . GERD (gastroesophageal reflux disease)   . Hypertension   . Hypogonadism male   . Hypovitaminosis D   . 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 Presence of permanent cardiac pacemaker   . Sleep apnea    cpap  . Type II diabetes mellitus ([place])   . Ventricular tachycardia ([place])    observed during stress testing, subsequent VT on EPS   Past Surgical History:  Procedure Laterality Date  . ABLATION  06-01-2013   PVC's ablated along basal inferoseptal RV by Dr Rayann Heman  . APPENDECTOMY  1969  . CARDIAC CATHETERIZATION  04/2010  . CARDIAC DEFIBRILLATOR PLACEMENT  05/09/2010   MDT ICD implanted in [facility] by Dr Ky Barban  . CATARACT EXTRACTION W/ INTRAOCULAR LENS  IMPLANT, BILATERAL Bilateral   . KNEE ARTHROSCOPY Left 1984  . LUMBAR LAMINECTOMY  ~ 2004  . TONSILLECTOMY AND ADENOIDECTOMY  1972  . TOTAL KNEE ARTHROPLASTY Left 04/28/2017  . TOTAL KNEE ARTHROPLASTY Left 04/28/2017   Procedure: TOTAL KNEE ARTHROPLASTY LEFT;  Surgeon: Dorna Leitz, MD;  Location: [place];  Service: Orthopedics;  Laterality: Left;  . V-TACH ABLATION N/A 06/01/2013   Procedure: V-TACH ABLATION;  Surgeon: Coralyn Mark, MD;  Location: [place] CATH LAB;  Service: Cardiovascular;  Laterality: N/A;  . VASECTOMY    . VENTRICULAR ABLATION SURGERY  06/01/2013   History   Social History  . Marital Status: Married    Spouse Name: N/A  . Number of Children: 2   Occupational History  . physician   Social  History Main Topics  . Smoking status: Never Smoker   . Smokeless tobacco: Never Used  . Alcohol Use: 1.2 oz/week    1 Glasses scotch, rarely  . 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    Moved here from [facility] 1.5 years ago   Current Outpatient Medications on File Prior to Visit  Medication Sig Dispense Refill  . aspirin (ASPIRIN 81) 81 MG EC tablet Take 1 tablet (81 mg total) by mouth daily. Swallow whole. 30 tablet 12  . Blood Glucose Monitoring Suppl (ONETOUCH VERIO) w/Device KIT Use to check blood sugar 3 times a day. 1 kit 0  . carvedilol (COREG) 6.25 MG tablet Take 6.25 mg by  mouth 2 (two) times daily with a meal.    . Dulaglutide (TRULICITY) 1.5 HD/6.2IW SOPN Inject 1.5 mg into the skin once a week. 12 pen 3  . empagliflozin (JARDIANCE) 10 MG TABS tablet Take 10 mg by mouth daily. 30 tablet 11  . glucose blood (ONETOUCH VERIO) test strip Use to check blood sugar 3 times a day. 200 each 12  . Insulin Glargine (LANTUS SOLOSTAR) 100 UNIT/ML Solostar Pen Inject 38 Units into the skin at bedtime. 45 mL 4  . Lancets MISC Glucose monitor to check blood sugars tid dx code E11.9 brand per insurance coverage 300 each 1  . losartan (COZAAR) 25 MG tablet Take 1 tablet (25 mg total) by mouth daily. 90 tablet 3  . metFORMIN (GLUCOPHAGE-XR) 500 MG 24 hr tablet Take 4 tablets (2,000 mg total) by mouth daily with breakfast. 360 tablet 1  . rosuvastatin (CRESTOR) 10 MG tablet Take 1 tablet (10 mg total) by mouth daily. 30 tablet 3  . spironolactone (ALDACTONE) 25 MG tablet TAKE 1 TABLET BY MOUTH ONCE DAILY 30 tablet 5  . tamsulosin (FLOMAX) 0.4 MG CAPS capsule Take 0.4 mg by mouth.    Marland Kitchen UNIFINE PENTIPS 32G X 4 MM MISC USE TWICE DAILY WITH LANTUS AND VICTOZA 200 each 11   No current facility-administered medications on file prior to visit.    Allergies  Allergen Reactions  . Lisinopril      angioedema   Family History  Problem Relation Age of Onset  . Alcohol abuse Mother   . COPD Mother   . Depression Mother   . Heart disease Mother   . Hypertension Mother   . Stroke Mother   . COPD Father   . Heart disease Father   . Hypertension Father   . Stroke Father   . Cancer Father   . Diabetes Sister   . Mental retardation Sister   . Learning disabilities Sister   . Hypertension Sister   . Alcohol abuse Brother   . Drug abuse Brother   . Heart disease Brother   . Hypertension Brother   . Coronary artery disease Brother    PE: BP 126/82   Pulse 82   Ht _0  (1.803 m) Comment: measured  Wt 221 lb (100.2 kg)   SpO2 98%   BMI 30.82 kg/m  Body mass index is 30.82 kg/m. Wt Readings from Last 3 Encounters:  02/25/19 221 lb (100.2 kg)  02/24/19 221 lb 9.6 oz (100.5 kg)  02/08/19 223 lb (101.2 kg)   Constitutional: overweight, in NAD Eyes: PERRLA, EOMI, no exophthalmos ENT: moist mucous membranes, no thyromegaly, no cervical lymphadenopathy Cardiovascular: RRR, No MRG Respiratory: CTA B Gastrointestinal: abdomen soft, NT, ND, BS+ Musculoskeletal: no deformities, strength intact in all 4 Skin: moist, warm, no rashes Neurological: no tremor with outstretched hands, DTR normal in all 4  ASSESSMENT: 1. DM2, insulin-dependent, uncontrolled, with complications - non-iCMP - CHF - CKD - DR - mild PN - ED  2.  History of hypogonadism  3. HL  PLAN:  1. Patient with longstanding, uncontrolled, type 2 diabetes, on a complex medication regimen, with long-acting insulin, oral medications, and GLP-1 receptor agonist.  Returns after another long absence due to the coronavirus pandemic.  At last visit, 6 months ago, we started [place], which he tolerates well.  At that time, we also reduced the dose of Amaryl and Lantus.  His sugars improved significantly.  However, recently he had a high glucose of 227, 2 weeks  ago on annual labs by PCP. -At this visit, reviewing his  sugars at home, he only checks in the morning and we discussed also checking later in the day, at different times of the day.  These are mostly at goal with few exceptions.  He had one low blood sugar in the morning and he feels that this was due to eating less the night before.  We did discuss that he may need a lower dose of Lantus if he continues to have sugars under 100 in the morning. -He is tolerating Jardiance well, but stopped Amaryl since last visit (he did not remember reducing the dose but stopped directly).  However, as of now, I do not have enough data to recommend adding it again.   -He also lost 7 pounds since last visit, which is excellent - I suggested to:  Patient Instructions  Please continue: - Metformin 2000 mg with breakfast - Jardiance 10 mg before b'fast - Trulicity 1.5 mg weekly - Lantus to 36-38 units at bedtime (try a lower dose: 30-32 units)  Stay off Amaryl for now.  Please return in 4 months with your sugar log.   - we checked his HbA1c: 6.8% (improved) - advised to check sugars at different times of the day - 1-2x a day, rotating check times - advised for yearly eye exams >> he is not UTD - return to clinic in 4 months     2.  History of hypogonadism -He was previously on AndroGel, however, this was not approved in the last 2 years so he is now off testosterone supplementation. -Reviewed latest testosterone level from 07/2018 and this was normal. -No further investigation needed for now  3. HL - Reviewed latest lipid panel from earlier this year: All fractions improved, at goal - Continues Crestor without side effects.  Philemon Kingdom, MD PhD [facility] Endocrinology

## 2019-02-25 NOTE — Patient Instructions (Addendum)
Please continue: - Metformin 2000 mg with breakfast - Jardiance 10 mg before b'fast - Trulicity 1.5 mg weekly - Lantus to 36-38 units at bedtime (try a lower dose: 30-32 units)  Stay off Amaryl for now.  Please return in 4 months with your sugar log.

## 2019-02-25 NOTE — Telephone Encounter (Signed)
Pt scheduled for system extraction on 03/19/2019 with Dr. Cristopher Peru  All lab work and covid testing scheduled.  Work up complete and Materials engineer sent via [employer].

## 2019-03-03 ENCOUNTER — Telehealth (HOSPITAL_COMMUNITY): Payer: Self-pay | Admitting: *Deleted

## 2019-03-03 NOTE — Telephone Encounter (Signed)
Received fax from [facility], pt needs clearance for R total knee arthroplasty.  Per Dr Aundra Dubin pt is "optimized for surgery from a medical and cardiac standpoint"  Form faxed back to them at [phone] Atten: Ignatius Specking

## 2019-03-06 MED FILL — SPIRONOLACTONE 25 MG TABS: 25 | 30 days supply | Qty: 30 | Fill #4

## 2019-03-06 MED FILL — UNIFINE PENTIPS 32GX5/32: 32G X 4 MM | 30 days supply | Qty: 100 | Fill #2

## 2019-03-06 MED FILL — metFORMIN HCL ER 500 MG TB2: 500 | 30 days supply | Qty: 120 | Fill #2

## 2019-03-06 MED FILL — TRULICITY 1.5 MG/0.5 ML PEN: 1.5 | 28 days supply | Qty: 2 | Fill #11

## 2019-03-06 MED FILL — UNIFINE PENTIPS 32GX5/32": 32G X 4 MM | 30 days supply | Qty: 100 | Fill #2

## 2019-03-06 MED FILL — LANTUS SOLOSTAR 100 UNITS/M: 100 | 30 days supply | Qty: 12 | Fill #8

## 2019-03-08 ENCOUNTER — Other Ambulatory Visit: Payer: Self-pay | Admitting: Orthopedic Surgery

## 2019-03-09 ENCOUNTER — Telehealth: Payer: Self-pay

## 2019-03-09 NOTE — Telephone Encounter (Signed)
Diabetic clearance form filled out and faxed to [facility].

## 2019-03-16 ENCOUNTER — Other Ambulatory Visit: Payer: BC Managed Care – PPO

## 2019-03-16 ENCOUNTER — Inpatient Hospital Stay (HOSPITAL_COMMUNITY)
Admission: RE | Admit: 2019-03-16 | Discharge: 2019-03-16 | Disposition: A | Discharge status: Home / Self Care | Payer: BC Managed Care – PPO | Source: Ambulatory Visit

## 2019-03-16 ENCOUNTER — Other Ambulatory Visit: Payer: Self-pay

## 2019-03-16 DIAGNOSIS — Z95810 Presence of automatic (implantable) cardiac defibrillator: Secondary | ICD-10-CM

## 2019-03-16 DIAGNOSIS — Z20822 Contact with and (suspected) exposure to covid-19: Secondary | ICD-10-CM

## 2019-03-16 DIAGNOSIS — I420 Dilated cardiomyopathy: Secondary | ICD-10-CM

## 2019-03-16 LAB — CBC WITH DIFFERENTIAL/PLATELET
Basophils Absolute: 0.1 10*3/uL (ref 0.0–0.2)
Basos: 1 %
EOS (ABSOLUTE): 0.4 10*3/uL (ref 0.0–0.4)
Eos: 5 %
Hematocrit: 41.3 % (ref 37.5–51.0)
Hemoglobin: 13.3 g/dL (ref 13.0–17.7)
Immature Grans (Abs): 0 10*3/uL (ref 0.0–0.1)
Immature Granulocytes: 0 %
Lymphocytes Absolute: 1.2 10*3/uL (ref 0.7–3.1)
Lymphs: 17 %
MCH: 29.4 pg (ref 26.6–33.0)
MCHC: 32.2 g/dL (ref 31.5–35.7)
MCV: 91 fL (ref 79–97)
Monocytes Absolute: 0.5 10*3/uL (ref 0.1–0.9)
Monocytes: 7 %
Neutrophils Absolute: 5.1 10*3/uL (ref 1.4–7.0)
Neutrophils: 70 %
Platelets: 229 10*3/uL (ref 150–450)
RBC: 4.52 x10E6/uL (ref 4.14–5.80)
RDW: 13.1 % (ref 11.6–15.4)
WBC: 7.2 10*3/uL (ref 3.4–10.8)

## 2019-03-16 LAB — BASIC METABOLIC PANEL
BUN/Creatinine Ratio: 19 (ref 10–24)
BUN: 23 mg/dL (ref 8–27)
CO2: 25 mmol/L (ref 20–29)
Calcium: 9 mg/dL (ref 8.6–10.2)
Chloride: 99 mmol/L (ref 96–106)
Creatinine, Ser: 1.19 mg/dL (ref 0.76–1.27)
GFR calc Af Amer: 71 mL/min/{1.73_m2} (ref >59)
GFR calc non Af Amer: 61 mL/min/{1.73_m2} (ref >59)
Glucose: 209 mg/dL — ABNORMAL HIGH (ref 65–99)
Potassium: 5.2 mmol/L (ref 3.5–5.2)
Sodium: 137 mmol/L (ref 134–144)

## 2019-03-17 LAB — NOVEL CORONAVIRUS, NAA: SARS-CoV-2, NAA: NOT DETECTED

## 2019-03-17 NOTE — Progress Notes (Addendum)
Patient contacted this morning due to missed covid appointment yesterday, left call back number to return call  8:12 AM Patient returned call, patient was send through the community site line, his results should be back for his procedure on Friday but if not patient is requesting a rapid test day of procedure

## 2019-03-18 ENCOUNTER — Other Ambulatory Visit: Payer: Self-pay

## 2019-03-18 ENCOUNTER — Encounter (HOSPITAL_COMMUNITY): Payer: Self-pay | Admitting: *Deleted

## 2019-03-18 MED FILL — TAMSULOSIN HCL 0.4 MG CAP: 0.4 | 30 days supply | Qty: 30 | Fill #5

## 2019-03-18 NOTE — Anesthesia Preprocedure Evaluation (Addendum)
Anesthesia Evaluation  Patient identified by MRN, date of birth, ID band Patient awake    Reviewed: Allergy & Precautions, NPO status , Patient's Chart, lab work & pertinent test results, reviewed documented beta blocker date and time   History of Anesthesia Complications Negative for: history of anesthetic complications  Airway Mallampati: II  TM Distance: >3 FB Neck ROM: Full    Dental  (+) Teeth Intact, Dental Advisory Given   Pulmonary sleep apnea and Continuous Positive Airway Pressure Ventilation ,    Pulmonary exam normal breath sounds clear to auscultation       Cardiovascular hypertension, Pt. on home beta blockers and Pt. on medications (-) angina+ CAD, + Cardiac Stents and +CHF  (-) Past MI Normal cardiovascular exam+ dysrhythmias Ventricular Tachycardia + pacemaker + Cardiac Defibrillator  Rhythm:Regular Rate:Normal     Neuro/Psych  Neuromuscular disease    GI/Hepatic Neg liver ROS, GERD  ,  Endo/Other  diabetes, Type 2, Insulin Dependent, Oral Hypoglycemic Agents  Renal/GU negative Renal ROS     Musculoskeletal  (+) Arthritis ,   Abdominal   Peds  Hematology negative hematology ROS (+)   Anesthesia Other Findings Day of surgery medications reviewed with the patient.  Reproductive/Obstetrics                           Anesthesia Physical Anesthesia Plan  ASA: IV  Anesthesia Plan: General   Post-op Pain Management:    Induction: Intravenous  PONV Risk Score and Plan: 2 and Midazolam, Dexamethasone and Ondansetron  Airway Management Planned: Oral ETT  Additional Equipment: Arterial line and TEE  Intra-op Plan:   Post-operative Plan: Extubation in OR  Informed Consent: I have reviewed the patients History and Physical, chart, labs and discussed the procedure including the risks, benefits and alternatives for the proposed anesthesia with the patient or authorized  representative who has indicated his/her understanding and acceptance.     Dental advisory given  Plan Discussed with: CRNA  Anesthesia Plan Comments: (Follows with cardiology for Hx of NICM s/p medtronic ICD (Possibly related to frequent PVCs) - Echo (3/20): EF 55-60%, normal RV. Frequent PVCs s/p failed ablation x 2 - Holter (7/19): 15% PVCs and Zio patch (6/20): 2.5% PVCs, 5.5% PACs, no atrial fibrillation. CAD s/p DES to dLAD and PDA 2016.   TTE 09/04/18:  1. The left ventricle has normal systolic function, with an ejection fraction of 55-60%. The cavity size was normal. Left ventricular diastolic Doppler parameters are consistent with impaired relaxation.  2. The right ventricle has normal systolic function. The cavity was normal. There is no increase in right ventricular wall thickness.  3. When compared to the prior study: EF now appears normal when compared to prior (45%).  )      Anesthesia Quick Evaluation

## 2019-03-18 NOTE — Progress Notes (Signed)
Pt stated that he takes Trulicity on Saturday.

## 2019-03-18 NOTE — Progress Notes (Signed)
Pt denies SOB and chest pain. Pt stated that he is under the care of Dr. Aundra Dubin, Cardiology and Dr. Renne Crigler, PCP. Pt stated that a stress test was performed " in the late 80's-early 90's. "   Pt denies having a chest x ray in the last year. Pt made aware to stop taking vitamins, fish oil and herbal medications. Do not take any NSAIDs ie: Ibuprofen, Advil, Naproxen (Aleve), Motrin, BC and Goody Powder. Pt stated that he stopped taking Aspirin due to surgery. Pt made aware to bring in CPAP machine, mask and hose. Pt made aware to take 19 units of Lantus at HS. Pt made aware to take no Jardiance the DOS. Pt stated that he took [place] today. Pt made aware to check CBG every 2 hours prior to arrival to hospital on DOS. Pt made aware to treat a CBG < 70 with  4 ounces of apple or cranberry juice, wait 15 minutes after intervention to recheck BG, if BG remains < 70, call Short Stay unit to speak with a nurse. Pt sated that he was instructed to eat a light breakfast by 7:00A.M. DOS. Pt verbalized understanding of all pre-op instructions. PA, Anesthesiology asked to review history; see note.

## 2019-03-19 ENCOUNTER — Ambulatory Visit (HOSPITAL_COMMUNITY): Payer: BC Managed Care – PPO | Admitting: Physician Assistant

## 2019-03-19 ENCOUNTER — Ambulatory Visit (HOSPITAL_COMMUNITY): Payer: BC Managed Care – PPO

## 2019-03-19 ENCOUNTER — Other Ambulatory Visit: Payer: Self-pay

## 2019-03-19 ENCOUNTER — Observation Stay (HOSPITAL_COMMUNITY)
Admission: AD | Admit: 2019-03-19 | Discharge: 2019-03-20 | Disposition: A | Discharge status: Home / Self Care | Payer: BC Managed Care – PPO | Attending: Internal Medicine | Admitting: Internal Medicine

## 2019-03-19 ENCOUNTER — Encounter (HOSPITAL_COMMUNITY)
Admission: AD | Disposition: A | Discharge status: Home / Self Care | Payer: BC Managed Care – PPO | Source: Home / Self Care | Attending: Internal Medicine

## 2019-03-19 ENCOUNTER — Encounter (HOSPITAL_COMMUNITY): Payer: Self-pay

## 2019-03-19 DIAGNOSIS — E1122 Type 2 diabetes mellitus with diabetic chronic kidney disease: Secondary | ICD-10-CM | POA: Diagnosis not present

## 2019-03-19 DIAGNOSIS — Z86718 Personal history of other venous thrombosis and embolism: Secondary | ICD-10-CM | POA: Insufficient documentation

## 2019-03-19 DIAGNOSIS — I2510 Atherosclerotic heart disease of native coronary artery without angina pectoris: Secondary | ICD-10-CM | POA: Insufficient documentation

## 2019-03-19 DIAGNOSIS — I420 Dilated cardiomyopathy: Secondary | ICD-10-CM | POA: Diagnosis present

## 2019-03-19 DIAGNOSIS — I129 Hypertensive chronic kidney disease with stage 1 through stage 4 chronic kidney disease, or unspecified chronic kidney disease: Secondary | ICD-10-CM | POA: Diagnosis not present

## 2019-03-19 DIAGNOSIS — Z888 Allergy status to other drugs, medicaments and biological substances status: Secondary | ICD-10-CM | POA: Insufficient documentation

## 2019-03-19 DIAGNOSIS — I428 Other cardiomyopathies: Secondary | ICD-10-CM | POA: Diagnosis not present

## 2019-03-19 DIAGNOSIS — Z4502 Encounter for adjustment and management of automatic implantable cardiac defibrillator: Principal | ICD-10-CM

## 2019-03-19 DIAGNOSIS — N183 Chronic kidney disease, stage 3 unspecified: Secondary | ICD-10-CM | POA: Diagnosis not present

## 2019-03-19 DIAGNOSIS — M1990 Unspecified osteoarthritis, unspecified site: Secondary | ICD-10-CM | POA: Diagnosis not present

## 2019-03-19 DIAGNOSIS — G4733 Obstructive sleep apnea (adult) (pediatric): Secondary | ICD-10-CM | POA: Diagnosis not present

## 2019-03-19 DIAGNOSIS — Z794 Long term (current) use of insulin: Secondary | ICD-10-CM | POA: Diagnosis not present

## 2019-03-19 DIAGNOSIS — Z79899 Other long term (current) drug therapy: Secondary | ICD-10-CM | POA: Diagnosis not present

## 2019-03-19 DIAGNOSIS — Z7982 Long term (current) use of aspirin: Secondary | ICD-10-CM | POA: Insufficient documentation

## 2019-03-19 DIAGNOSIS — K219 Gastro-esophageal reflux disease without esophagitis: Secondary | ICD-10-CM | POA: Insufficient documentation

## 2019-03-19 DIAGNOSIS — I5022 Chronic systolic (congestive) heart failure: Secondary | ICD-10-CM | POA: Insufficient documentation

## 2019-03-19 DIAGNOSIS — Z95810 Presence of automatic (implantable) cardiac defibrillator: Secondary | ICD-10-CM | POA: Diagnosis present

## 2019-03-19 HISTORY — DX: Encounter for adjustment and management of automatic implantable cardiac defibrillator: Z45.02

## 2019-03-19 HISTORY — DX: Presence of external hearing-aid: Z97.4

## 2019-03-19 HISTORY — DX: Atherosclerotic heart disease of native coronary artery without angina pectoris: I25.10

## 2019-03-19 HISTORY — DX: Presence of spectacles and contact lenses: Z97.3

## 2019-03-19 HISTORY — DX: Ventricular premature depolarization: I49.3

## 2019-03-19 HISTORY — DX: Chronic kidney disease, stage 3 unspecified: N18.30

## 2019-03-19 HISTORY — DX: Deep phlebothrombosis in pregnancy, unspecified trimester: O22.30

## 2019-03-19 HISTORY — PX: ICD LEAD REMOVAL: SHX5855

## 2019-03-19 HISTORY — DX: Other cardiomyopathies: I42.8

## 2019-03-19 LAB — GLUCOSE, CAPILLARY
Glucose-Capillary: 137 mg/dL — ABNORMAL HIGH (ref 70–99)
Glucose-Capillary: 112 mg/dL — ABNORMAL HIGH (ref 70–99)
Glucose-Capillary: 292 mg/dL — ABNORMAL HIGH (ref 70–99)

## 2019-03-19 LAB — PREPARE RBC (CROSSMATCH)

## 2019-03-19 LAB — ECHO INTRAOPERATIVE TEE
Height: 73 in
Weight: 3408 oz

## 2019-03-19 LAB — SURGICAL PCR SCREEN
MRSA, PCR: NEGATIVE
Staphylococcus aureus: NEGATIVE

## 2019-03-19 SURGERY — REMOVAL, ELECTRODE LEAD, ICD
Anesthesia: General | Site: Chest

## 2019-03-19 MED ORDER — HYDRALAZINE HCL 20 MG/ML IJ SOLN
INTRAMUSCULAR | Status: AC
  Filled 2019-03-19: qty 1

## 2019-03-19 MED ORDER — FENTANYL CITRATE (PF) 250 MCG/5ML IJ SOLN
INTRAMUSCULAR | Status: AC
  Filled 2019-03-19: qty 5

## 2019-03-19 MED ORDER — SODIUM CHLORIDE 0.9 % IV SOLN
INTRAVENOUS | Status: DC | PRN
  Administered 2019-03-19: 500 mL

## 2019-03-19 MED ORDER — FENTANYL CITRATE (PF) 100 MCG/2ML IJ SOLN: 25.0000 ug | INTRAMUSCULAR | Status: DC | PRN

## 2019-03-19 MED ORDER — SODIUM CHLORIDE 0.9% IV SOLUTION: Freq: Once | INTRAVENOUS | Status: DC

## 2019-03-19 MED ORDER — SPIRONOLACTONE 25 MG PO TABS
25.0000 mg | ORAL_TABLET | Freq: Every day | ORAL | Status: DC
  Administered 2019-03-20: 08:00:00 25 mg via ORAL
  Filled 2019-03-19: qty 1

## 2019-03-19 MED ORDER — FENTANYL CITRATE (PF) 100 MCG/2ML IJ SOLN
INTRAMUSCULAR | Status: AC
  Filled 2019-03-19: qty 2

## 2019-03-19 MED ORDER — MIDAZOLAM HCL 2 MG/2ML IJ SOLN
INTRAMUSCULAR | Status: AC
  Filled 2019-03-19: qty 2

## 2019-03-19 MED ORDER — SUGAMMADEX SODIUM 200 MG/2ML IV SOLN
INTRAVENOUS | Status: DC | PRN
  Administered 2019-03-19: 200 mg via INTRAVENOUS

## 2019-03-19 MED ORDER — FENTANYL CITRATE (PF) 250 MCG/5ML IJ SOLN
INTRAMUSCULAR | Status: DC | PRN
  Administered 2019-03-19: 50 ug via INTRAVENOUS
  Administered 2019-03-19: 100 ug via INTRAVENOUS

## 2019-03-19 MED ORDER — DULAGLUTIDE 1.5 MG/0.5ML ~~LOC~~ SOAJ: 1.5000 mg | SUBCUTANEOUS | Status: DC

## 2019-03-19 MED ORDER — MUPIROCIN 2 % EX OINT
TOPICAL_OINTMENT | CUTANEOUS | Status: AC
  Filled 2019-03-19: qty 22

## 2019-03-19 MED ORDER — CHLORHEXIDINE GLUCONATE 4 % EX LIQD: 60.0000 mL | Freq: Once | CUTANEOUS | Status: DC

## 2019-03-19 MED ORDER — PHENYLEPHRINE 40 MCG/ML (10ML) SYRINGE FOR IV PUSH (FOR BLOOD PRESSURE SUPPORT)
PREFILLED_SYRINGE | INTRAVENOUS | Status: AC
  Filled 2019-03-19: qty 10

## 2019-03-19 MED ORDER — HYDRALAZINE HCL 20 MG/ML IJ SOLN
5.0000 mg | Freq: Once | INTRAMUSCULAR | Status: AC
  Administered 2019-03-19: 20:00:00 5 mg via INTRAVENOUS

## 2019-03-19 MED ORDER — SUCCINYLCHOLINE CHLORIDE 200 MG/10ML IV SOSY
PREFILLED_SYRINGE | INTRAVENOUS | Status: DC | PRN
  Administered 2019-03-19: 100 mg via INTRAVENOUS

## 2019-03-19 MED ORDER — TAMSULOSIN HCL 0.4 MG PO CAPS
0.4000 mg | ORAL_CAPSULE | Freq: Every day | ORAL | Status: DC
  Administered 2019-03-19: 22:00:00 0.4 mg via ORAL
  Filled 2019-03-19: qty 1

## 2019-03-19 MED ORDER — SODIUM CHLORIDE 0.9 % IV SOLN
INTRAVENOUS | Status: AC
  Filled 2019-03-19 (×2): qty 2

## 2019-03-19 MED ORDER — ROCURONIUM BROMIDE 50 MG/5ML IV SOSY
PREFILLED_SYRINGE | INTRAVENOUS | Status: DC | PRN
  Administered 2019-03-19: 60 mg via INTRAVENOUS

## 2019-03-19 MED ORDER — ONDANSETRON HCL 4 MG/2ML IJ SOLN
INTRAMUSCULAR | Status: DC | PRN
  Administered 2019-03-19: 4 mg via INTRAVENOUS

## 2019-03-19 MED ORDER — MIDAZOLAM HCL 2 MG/2ML IJ SOLN
1.0000 mg | Freq: Once | INTRAMUSCULAR | Status: AC
  Administered 2019-03-19: 14:00:00 1 mg via INTRAVENOUS

## 2019-03-19 MED ORDER — ACETAMINOPHEN 500 MG PO TABS: 1000.0000 mg | ORAL_TABLET | Freq: Four times a day (QID) | ORAL | Status: DC | PRN

## 2019-03-19 MED ORDER — CARVEDILOL 6.25 MG PO TABS
6.2500 mg | ORAL_TABLET | Freq: Two times a day (BID) | ORAL | Status: DC
  Administered 2019-03-20: 08:00:00 6.25 mg via ORAL
  Filled 2019-03-19: qty 1

## 2019-03-19 MED ORDER — ONDANSETRON HCL 4 MG/2ML IJ SOLN: 4.0000 mg | Freq: Once | INTRAMUSCULAR | Status: DC | PRN

## 2019-03-19 MED ORDER — ROCURONIUM BROMIDE 10 MG/ML (PF) SYRINGE
PREFILLED_SYRINGE | INTRAVENOUS | Status: AC
  Filled 2019-03-19: qty 10

## 2019-03-19 MED ORDER — MUPIROCIN 2 % EX OINT
1.0000 "application " | TOPICAL_OINTMENT | Freq: Once | CUTANEOUS | Status: AC
  Administered 2019-03-19: 1 via TOPICAL

## 2019-03-19 MED ORDER — LIDOCAINE 2% (20 MG/ML) 5 ML SYRINGE
INTRAMUSCULAR | Status: DC | PRN
  Administered 2019-03-19: 100 mg via INTRAVENOUS

## 2019-03-19 MED ORDER — DEXAMETHASONE SODIUM PHOSPHATE 10 MG/ML IJ SOLN
INTRAMUSCULAR | Status: DC | PRN
  Administered 2019-03-19: 10 mg via INTRAVENOUS

## 2019-03-19 MED ORDER — SODIUM CHLORIDE 0.9 % IV SOLN: INTRAVENOUS | Status: DC

## 2019-03-19 MED ORDER — METFORMIN HCL ER 750 MG PO TB24: 2000.0000 mg | ORAL_TABLET | Freq: Every evening | ORAL | Status: DC

## 2019-03-19 MED ORDER — SODIUM CHLORIDE 0.9 % IV SOLN
INTRAVENOUS | Status: DC | PRN
  Administered 2019-03-19: 17:00:00 20 ug/min via INTRAVENOUS

## 2019-03-19 MED ORDER — PROPOFOL 10 MG/ML IV BOLUS
INTRAVENOUS | Status: AC
  Filled 2019-03-19: qty 20

## 2019-03-19 MED ORDER — DEXAMETHASONE SODIUM PHOSPHATE 10 MG/ML IJ SOLN
INTRAMUSCULAR | Status: AC
  Filled 2019-03-19: qty 2

## 2019-03-19 MED ORDER — 0.9 % SODIUM CHLORIDE (POUR BTL) OPTIME
TOPICAL | Status: DC | PRN
  Administered 2019-03-19: 17:00:00 1000 mL

## 2019-03-19 MED ORDER — LIDOCAINE 2% (20 MG/ML) 5 ML SYRINGE
INTRAMUSCULAR | Status: AC
  Filled 2019-03-19: qty 5

## 2019-03-19 MED ORDER — LIDOCAINE HCL (PF) 1 % IJ SOLN
INTRAMUSCULAR | Status: DC | PRN
  Administered 2019-03-19: 30 mL

## 2019-03-19 MED ORDER — ASPIRIN EC 81 MG PO TBEC
81.0000 mg | DELAYED_RELEASE_TABLET | Freq: Every day | ORAL | Status: DC
  Administered 2019-03-20: 08:00:00 81 mg via ORAL
  Filled 2019-03-19 (×4): qty 1

## 2019-03-19 MED ORDER — GLYCOPYRROLATE 0.2 MG/ML IJ SOLN
INTRAMUSCULAR | Status: DC | PRN
  Administered 2019-03-19: 0.2 mg via INTRAVENOUS

## 2019-03-19 MED ORDER — SODIUM CHLORIDE 0.9 % IV SOLN
INTRAVENOUS | Status: DC | PRN
  Administered 2019-03-19: 17:00:00 via INTRAVENOUS

## 2019-03-19 MED ORDER — LOSARTAN POTASSIUM 25 MG PO TABS
25.0000 mg | ORAL_TABLET | Freq: Every day | ORAL | Status: DC
  Administered 2019-03-20: 08:00:00 25 mg via ORAL
  Filled 2019-03-19: qty 1

## 2019-03-19 MED ORDER — INSULIN ASPART 100 UNIT/ML ~~LOC~~ SOLN: 0.0000 [IU] | Freq: Three times a day (TID) | SUBCUTANEOUS | Status: DC

## 2019-03-19 MED ORDER — CEFAZOLIN SODIUM-DEXTROSE 2-4 GM/100ML-% IV SOLN
2.0000 g | INTRAVENOUS | Status: AC
  Administered 2019-03-19: 2 g via INTRAVENOUS

## 2019-03-19 MED ORDER — INSULIN ASPART 100 UNIT/ML ~~LOC~~ SOLN
0.0000 [IU] | Freq: Every day | SUBCUTANEOUS | Status: DC
  Administered 2019-03-19: 22:00:00 3 [IU] via SUBCUTANEOUS

## 2019-03-19 MED ORDER — EPHEDRINE 5 MG/ML INJ
INTRAVENOUS | Status: AC
  Filled 2019-03-19: qty 10

## 2019-03-19 MED ORDER — FENTANYL CITRATE (PF) 100 MCG/2ML IJ SOLN
50.0000 ug | Freq: Once | INTRAMUSCULAR | Status: AC
  Administered 2019-03-19: 14:00:00 50 ug via INTRAVENOUS

## 2019-03-19 MED ORDER — INSULIN ASPART 100 UNIT/ML ~~LOC~~ SOLN: 4.0000 [IU] | Freq: Three times a day (TID) | SUBCUTANEOUS | Status: DC

## 2019-03-19 MED ORDER — SODIUM CHLORIDE 0.9 % IV SOLN
80.0000 mg | INTRAVENOUS | Status: DC
  Filled 2019-03-19: qty 2

## 2019-03-19 MED ORDER — LIDOCAINE HCL (PF) 1 % IJ SOLN
INTRAMUSCULAR | Status: AC
  Filled 2019-03-19: qty 30

## 2019-03-19 MED ORDER — INSULIN GLARGINE 100 UNIT/ML ~~LOC~~ SOLN
38.0000 [IU] | Freq: Every day | SUBCUTANEOUS | Status: DC
  Administered 2019-03-19: 22:00:00 38 [IU] via SUBCUTANEOUS
  Filled 2019-03-19 (×2): qty 0.38

## 2019-03-19 MED ORDER — ONDANSETRON HCL 4 MG/2ML IJ SOLN
INTRAMUSCULAR | Status: AC
  Filled 2019-03-19: qty 2

## 2019-03-19 MED ORDER — SUCCINYLCHOLINE CHLORIDE 200 MG/10ML IV SOSY
PREFILLED_SYRINGE | INTRAVENOUS | Status: AC
  Filled 2019-03-19: qty 10

## 2019-03-19 MED ORDER — EPHEDRINE SULFATE-NACL 50-0.9 MG/10ML-% IV SOSY
PREFILLED_SYRINGE | INTRAVENOUS | Status: DC | PRN
  Administered 2019-03-19: 5 mg via INTRAVENOUS

## 2019-03-19 MED ORDER — LACTATED RINGERS IV SOLN
INTRAVENOUS | Status: DC
  Administered 2019-03-19: 13:00:00 via INTRAVENOUS

## 2019-03-19 MED ORDER — CEFAZOLIN SODIUM-DEXTROSE 2-4 GM/100ML-% IV SOLN
INTRAVENOUS | Status: AC
  Filled 2019-03-19: qty 100

## 2019-03-19 MED ORDER — PROPOFOL 10 MG/ML IV BOLUS
INTRAVENOUS | Status: DC | PRN
  Administered 2019-03-19: 150 mg via INTRAVENOUS

## 2019-03-19 MED ORDER — ACETAMINOPHEN 500 MG PO TABS
1000.0000 mg | ORAL_TABLET | Freq: Once | ORAL | Status: AC
  Administered 2019-03-19: 13:00:00 1000 mg via ORAL
  Filled 2019-03-19: qty 2

## 2019-03-19 SURGICAL SUPPLY — 49 items
BAG BANDED W/RUBBER/TAPE 36X54 (MISCELLANEOUS) ×1 IMPLANT
BAG DECANTER FOR FLEXI CONT (MISCELLANEOUS) ×1 IMPLANT
BLADE CLIPPER SURG (BLADE) ×1 IMPLANT
BLADE OSCILLATING /SAGITTAL (BLADE) IMPLANT
BLADE STERNUM SYSTEM 6 (BLADE) IMPLANT
BNDG COHESIVE 4X5 WHT NS (GAUZE/BANDAGES/DRESSINGS) ×2 IMPLANT
CANISTER SUCT 3000ML PPV (MISCELLANEOUS) ×2 IMPLANT
COIL ONE TIE COMPRESSION (VASCULAR PRODUCTS) ×1 IMPLANT
COVER BACK TABLE 60X90IN (DRAPES) ×2 IMPLANT
COVER WAND RF STERILE (DRAPES) ×1 IMPLANT
DRAPE C-ARM 42X72 X-RAY (DRAPES) IMPLANT
DRAPE CARDIOVASCULAR INCISE (DRAPES) ×1
DRAPE HALF SHEET 40X57 (DRAPES) ×4 IMPLANT
DRAPE INCISE IOBAN 66X45 STRL (DRAPES) IMPLANT
DRAPE SRG 135X102X78XABS (DRAPES) ×1 IMPLANT
DRSG PAD ABDOMINAL 8X10 ST (GAUZE/BANDAGES/DRESSINGS) ×1 IMPLANT
DRSG TEGADERM 2-3/8X2-3/4 SM (GAUZE/BANDAGES/DRESSINGS) ×1 IMPLANT
ELECT REM PT RETURN 9FT ADLT (ELECTROSURGICAL) ×4
ELECTRODE REM PT RTRN 9FT ADLT (ELECTROSURGICAL) ×2 IMPLANT
FELT TEFLON 1X6 (MISCELLANEOUS) IMPLANT
GAUZE 4X4 16PLY RFD (DISPOSABLE) ×3 IMPLANT
GAUZE SPONGE 4X4 12PLY STRL (GAUZE/BANDAGES/DRESSINGS) ×3 IMPLANT
GLOVE BIOGEL PI IND STRL 7.5 (GLOVE) ×1 IMPLANT
GLOVE BIOGEL PI INDICATOR 7.5 (GLOVE) ×1
GLOVE ECLIPSE 8.0 STRL XLNG CF (GLOVE) ×2 IMPLANT
GOWN STRL REUS W/ TWL LRG LVL3 (GOWN DISPOSABLE) ×1 IMPLANT
GOWN STRL REUS W/ TWL XL LVL3 (GOWN DISPOSABLE) ×1 IMPLANT
GOWN STRL REUS W/TWL LRG LVL3 (GOWN DISPOSABLE) ×2
GOWN STRL REUS W/TWL XL LVL3 (GOWN DISPOSABLE) ×1
KIT LEAD ACCESSORY 6056M PINCH (KITS) ×1 IMPLANT
KIT TURNOVER KIT B (KITS) ×2 IMPLANT
NS IRRIG 1000ML POUR BTL (IV SOLUTION) ×1 IMPLANT
PACEMAKER STYLET GRAY (MISCELLANEOUS) ×2 IMPLANT
PACEMAKER STYLET GRAY 65 (MISCELLANEOUS) IMPLANT
PAD ARMBOARD 7.5X6 YLW CONV (MISCELLANEOUS) ×4 IMPLANT
PAD ELECT DEFIB RADIOL ZOLL (MISCELLANEOUS) ×2 IMPLANT
SHEATH EVOLUTION RL 11F (SHEATH) ×1 IMPLANT
SHEATH EVOLUTION SHORTE RL 11F (SHEATH) ×1 IMPLANT
STYLET LIBERATOR LOCKING (MISCELLANEOUS) ×2 IMPLANT
SUT PROLENE 2 0 CT2 30 (SUTURE) IMPLANT
SUT PROLENE 2 0 SH DA (SUTURE) IMPLANT
SUT VIC AB 2-0 CT2 18 VCP726D (SUTURE) ×1 IMPLANT
SUT VIC AB 3-0 X1 27 (SUTURE) ×1 IMPLANT
TAPE CLOTH SURG 6X10 WHT LF (GAUZE/BANDAGES/DRESSINGS) ×1 IMPLANT
TOWEL GREEN STERILE (TOWEL DISPOSABLE) ×3 IMPLANT
TOWEL GREEN STERILE FF (TOWEL DISPOSABLE) ×3 IMPLANT
TRAY FOLEY MTR SLVR 16FR STAT (SET/KITS/TRAYS/PACK) ×2 IMPLANT
TUBE CONNECTING 12X1/4 (SUCTIONS) IMPLANT
YANKAUER SUCT BULB TIP NO VENT (SUCTIONS) IMPLANT

## 2019-03-19 NOTE — Progress Notes (Signed)
Orthopedic Tech Progress Note Patient Details:  Paul [name] Oct 25, 1945 [id number] PACU RN called requesting ARM SLING for patient Ortho Devices Type of Ortho Device: Arm sling Ortho Device/Splint Location: ULE Ortho Device/Splint Interventions: Adjustment, Application, Ordered   Post Interventions Patient Tolerated: Well Instructions Provided: Care of device, Adjustment of device   Janit Pagan 03/19/2019, 6:45 PM

## 2019-03-19 NOTE — Progress Notes (Signed)
  Echocardiogram Echocardiogram Transesophageal has been performed.  Paul [name] 03/19/2019, 6:07 PM

## 2019-03-19 NOTE — Anesthesia Procedure Notes (Signed)
Procedure Name: Intubation Date/Time: 03/19/2019 4:19 PM Performed by: Shirlyn Goltz, CRNA Pre-anesthesia Checklist: Patient identified, Emergency Drugs available, Suction available and Patient being monitored Patient Re-evaluated:Patient Re-evaluated prior to induction Oxygen Delivery Method: Circle system utilized Preoxygenation: Pre-oxygenation with 100% oxygen Induction Type: IV induction Ventilation: Mask ventilation without difficulty Laryngoscope Size: Mac and 4 Grade View: Grade I Tube type: Oral Tube size: 7.5 mm Number of attempts: 1 Airway Equipment and Method: Stylet Placement Confirmation: ETT inserted through vocal cords under direct vision,  positive ETCO2 and breath sounds checked- equal and bilateral Secured at: 21 cm Tube secured with: Tape Dental Injury: Teeth and Oropharynx as per pre-operative assessment

## 2019-03-19 NOTE — H&P (Signed)
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 He is a 73 yo man with a non-ischemic CM and PVC's who underwent ICD insertion in 2001. He has done well in the interim and his EF has improved and he has not received any ICD therapies. The patient has reached end of service and presents to discuss system extraction. He would like to have his ICD removed.  Allergies  Allergen Reactions  . Lisinopril     angioedema           Current Outpatient Medications  Medication Sig Dispense Refill  . aspirin EC 325 MG tablet Take 1 tablet (325 mg total) 2 (two) times daily after a meal by mouth. Take x 1 month post op to decrease risk of blood clots. 60 tablet 0  . Blood Glucose Monitoring Suppl (ONETOUCH VERIO) w/Device KIT Use to check blood sugar 3 times a day. 1 kit 0  . carvedilol (COREG) 6.25 MG tablet Take 6.25 mg by mouth 2 (two) times daily with a meal.    . Dulaglutide (TRULICITY) 1.5 YI/9.4WN SOPN Inject 1.5 mg into the skin once a week. 12 pen 3  . empagliflozin (JARDIANCE) 10 MG TABS tablet Take 10 mg by mouth daily. 30 tablet 11  . glucose blood (ONETOUCH VERIO) test strip Use to check blood sugar 3 times a day. 200 each 12  . Insulin Glargine (LANTUS SOLOSTAR) 100 UNIT/ML Solostar Pen Inject 38 Units into the skin at bedtime. 45 mL 4  . Lancets MISC Glucose monitor to check blood sugars tid dx code E11.9 brand per insurance coverage 300 each 1  . metFORMIN (GLUCOPHAGE-XR) 500 MG 24 hr tablet Take 4 tablets (2,000 mg total) by mouth daily with breakfast. 360 tablet 1  . rosuvastatin (CRESTOR) 10 MG tablet Take 1 tablet (10 mg total) by mouth daily. 30 tablet 3  . sacubitril-valsartan (ENTRESTO) 49-51 MG Take 1 tablet by mouth 2 (two) times daily. 180 tablet 4  . spironolactone (ALDACTONE) 25 MG tablet TAKE 1 TABLET BY MOUTH ONCE DAILY 30 tablet 5  . tamsulosin (FLOMAX) 0.4 MG CAPS capsule Take 0.4 mg by mouth.    Marland Kitchen UNIFINE PENTIPS 32G X 4 MM MISC USE  TWICE DAILY WITH LANTUS AND VICTOZA 200 each 11   No current facility-administered medications for this visit.          Past Medical History:  Diagnosis Date  . AICD (automatic cardioverter/defibrillator) present    "on last leg not going to replace"  . Arthritis    "thumbs, neck, knees" (06/01/2013)  . Cardiomyopathy, dilated, nonischemic ([place]) 10/20/2012  . Cataract   . CHF (congestive heart failure) ([place])    "mild" (06/01/2013)  . Dysrhythmia   . Erectile dysfunction   . GERD (gastroesophageal reflux disease)   . Hypertension   . Hypogonadism male   . Hypovitaminosis D   . 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 Presence of permanent cardiac pacemaker   . Sleep apnea    cpap  . Type II diabetes mellitus ([place])   . Ventricular tachycardia ([place])    observed during stress testing, subsequent VT on EPS    ROS:   All systems reviewed and negative except as noted in the HPI.        Past Surgical History:  Procedure Laterality Date  . ABLATION  06-01-2013   PVC's ablated along basal inferoseptal RV by Dr Rayann Heman  . APPENDECTOMY  1969  .  CARDIAC CATHETERIZATION  04/2010  . CARDIAC DEFIBRILLATOR PLACEMENT  05/09/2010   MDT ICD implanted in [place] by Dr Ky Barban  . CATARACT EXTRACTION W/ INTRAOCULAR LENS  IMPLANT, BILATERAL Bilateral   . KNEE ARTHROSCOPY Left 1984  . LUMBAR LAMINECTOMY  ~ 2004  . TONSILLECTOMY AND ADENOIDECTOMY  1972  . TOTAL KNEE ARTHROPLASTY Left 04/28/2017  . TOTAL KNEE ARTHROPLASTY Left 04/28/2017   Procedure: TOTAL KNEE ARTHROPLASTY LEFT;  Surgeon: Dorna Leitz, MD;  Location: [facility];  Service: Orthopedics;  Laterality: Left;  . V-TACH ABLATION N/A 06/01/2013   Procedure: V-TACH ABLATION;  Surgeon: Coralyn Mark, MD;  Location: [facility];  Service: Cardiovascular;  Laterality: N/A;  . VASECTOMY    . VENTRICULAR ABLATION SURGERY  06/01/2013           Family History  Problem Relation Age of Onset  . Alcohol abuse Mother   . COPD Mother   . Depression Mother   . Heart disease Mother   . Hypertension Mother   . Stroke Mother   . COPD Father   . Heart disease Father   . Hypertension Father   . Stroke Father   . Cancer Father   . Diabetes Sister   . Mental retardation Sister   . Learning disabilities Sister   . Hypertension Sister   . Alcohol abuse Brother   . Drug abuse Brother   . Heart disease Brother   . Hypertension Brother   . Coronary artery disease Brother      Social History        Socioeconomic History  . Marital status: Married    Spouse name: Not on file  . Number of children: Not on file  . Years of education: Not on file  . Highest education level: Not on file  Occupational History  . Not on file  Social Needs  . Financial resource strain: Not on file  . Food insecurity    Worry: Not on file    Inability: Not on file  . Transportation needs    Medical: Not on file    Non-medical: Not on file  Tobacco Use  . Smoking status: Never Smoker  . Smokeless tobacco: Never Used  Substance and Sexual Activity  . Alcohol use: Yes    Alcohol/week: 2.0 standard drinks    Types: 1 Glasses of wine, 1 Cans of beer per week  . Drug use: No  . Sexual activity: Yes  Lifestyle  . Physical activity    Days per week: Not on file    Minutes per session: Not on file  . Stress: Not on file  Relationships  . Social Herbalist on phone: Not on file    Gets together: Not on file    Attends religious service: Not on file    Active member of club or organization: Not on file    Attends meetings of clubs or organizations: Not on file    Relationship status: Not on file  . Intimate partner violence    Fear of current or ex partner: Not on file    Emotionally abused: Not on file    Physically abused: Not on file    Forced sexual activity: Not on  file  Other Topics Concern  . 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    Moved here from [facility] 1.5 years ago  BP 136/74   Pulse (!) 52   Ht '5\' 11"'  (1.803 m)   Wt 223 lb (101.2 kg)   SpO2 97%   BMI 31.10 kg/m   Physical Exam:  Well appearing 73 yo man, NAD HEENT: Unremarkable Neck:  6 cm JVD, no thyromegally Lymphatics:  No adenopathy Back:  No CVA tenderness Lungs:  Clear with no wheezes HEART:  Regular rate rhythm, no murmurs, no rubs, no clicks Abd:  soft, positive bowel sounds, no organomegally, no rebound, no guarding Ext:  2 plus pulses, no edema, no cyanosis, no clubbing Skin:  No rashes no nodules Neuro:  CN II through XII intact, motor grossly intact   DEVICE  Normal device function.  See PaceArt for details. EOS  Assess/Plan: 1. ICD - I have discussed the pro's and con's,risks/benefits/goals/expectations of ICD system extraction with the patient and he would like to proceed. We will schedule this in the coming weeks as his schedule allows.  2. PVC"s - these are much improved.  3. Chronic systolic heart failure - he has had normalization of his LV function by echo. I spent 25 minutes including 50% face to face time with the patient discussing the extraction.  Cristopher Peru, M.D.  EP Attending  Patient seen and examined. Agree with the findings as above except that the patient's ICD was implanted in 2011. I have reviewed the indications/risks/benefits/ and he is willing to proceed.  Mikle [place]worth.D.

## 2019-03-19 NOTE — CV Procedure (Signed)
EP procedure note  Procedure performed: Extraction of a dual-chamber ICD which had reached elective replacement and fluids the patient's left ventricular function normalized and in whom the patient requested removal of the device  Preoperative diagnosis: Nonischemic cardiomyopathy, PVCs, with normalization of the left ventricular systolic function and a device that was end-of-life  Postoperative diagnosis: Same as preoperative diagnosis  Description of the procedure: After informed consent was obtained, the patient was taken to the operating room in the fasting state.  The anesthesia service was utilized to provide general endotracheal anesthesia.  The anesthesia service was utilized to provide invasive hemodynamic monitoring.  After the usual preparation draping, a 6 French sheath was inserted percutaneously in the right femoral vein for central venous access.  Attention was then turned to removal of the defibrillator system.  A 5 cm incision was carried out in the left infraclavicular region.  Electrocautery was utilized to dissect down to the ICD generator which was removed with gentle traction.  The leads were freed up from there are fibrous scar tissue with electrocautery.  The sewing sleeve was removed from its silk suture ties.  The Medtronic stylette's were inserted into the body of both the atrial and the defibrillator lead and the helix retracted.  The Medtronic stylets were removed and a Cook liberator locking stylette was inserted into the body of the atrial lead.  A Cook 1 tie suture was placed on the proximal portion of the lead.  The [facility] 11 [place] short RL dissection sheath was advanced over the atrial lead and with gentle traction and pressure on the extraction sheath, the lead was removed with gentle traction and in total with no hemodynamic sequelae.  Attention was then turned to the defibrillator lead.  The defibrillator lead was cut.  A Cook liberator locking stylette was inserted into  the body of the lead and fixed in place.  A Cook 1 tie proximal suture was placed on the proximal portion of the lead.  The Cook 11 [place] short RL dissection sheath was advanced over the defibrillator lead down to the junction with the innominate vein and the superior vena cava.  The short sheath was removed and the [facility] 11 [place] long RL dissection sheath was advanced over the defibrillator lead and a combination of traction and countertraction, pressure and counterpressure was utilized along with the cutting portion of the dissection sheath and the lead was removed in total.  There is no hemodynamic sequelae.  Pressure was held manually.  Electrocautery was utilized to assure hemostasis.  The pocket was irrigated with antibiotic irrigation.  The incision was closed with 2 layers of Vicryl suture.  The right femoral venous sheath was removed and pressure held and hemostasis assured.  Benzoin and Steri-Strips were painted on the skin and a bandage placed.  The patient was returned to the recovery area in stable condition.  Complications: There were no immediate procedural complications  Conclusion: Successful extraction of a dual-chamber 73-year-old ICD system and a device that had reached end-of-life and home the patient's left ventricular function normalized and he preferred to have the entire system removed without a new system placed.  Cristopher Peru, MD

## 2019-03-19 NOTE — Transfer of Care (Signed)
Immediate Anesthesia Transfer of Care Note  Patient: KAREM CHADICK  Procedure(s) Performed: ICD LEAD AND GENERATOR REMOVAL (N/A Chest)  Patient Location: PACU  Anesthesia Type:General  Level of Consciousness: awake, alert , oriented and patient cooperative  Airway & Oxygen Therapy: Patient Spontanous Breathing and Patient connected to nasal cannula oxygen  Post-op Assessment: Report given to RN and Post -op Vital signs reviewed and stable BP 185/90 same as preop  Post vital signs: Reviewed and stable  Last Vitals:  Vitals Value Taken Time  BP    Temp    Pulse 71 03/19/19 1806  Resp 11 03/19/19 1806  SpO2 100 % 03/19/19 1806  Vitals shown include unvalidated device data.  Last Pain:  Vitals:   03/19/19 1242  TempSrc: Oral  PainSc: 2       Patients Stated Pain Goal: 2 ([phone])  Complications: No apparent anesthesia complications

## 2019-03-19 NOTE — Anesthesia Procedure Notes (Signed)
Arterial Line Insertion Start/End10/07/2018 1:30 PM, 03/19/2019 1:48 PM Performed by: Josephine Igo, CRNA, CRNA  Preanesthetic checklist: patient identified, IV checked, surgical consent, monitors and equipment checked and pre-op evaluation Lidocaine 1% used for infiltration and patient sedated Left, radial was placed Catheter size: 20 G Hand hygiene performed , maximum sterile barriers used  and Seldinger technique used  Attempts: 1 Procedure performed without using ultrasound guided technique. Following insertion, dressing applied and Biopatch. Post procedure assessment: normal  Patient tolerated the procedure well with no immediate complications.

## 2019-03-20 ENCOUNTER — Encounter (HOSPITAL_COMMUNITY): Payer: Self-pay | Admitting: Physician Assistant

## 2019-03-20 DIAGNOSIS — Z4502 Encounter for adjustment and management of automatic implantable cardiac defibrillator: Secondary | ICD-10-CM | POA: Diagnosis not present

## 2019-03-20 DIAGNOSIS — Z95810 Presence of automatic (implantable) cardiac defibrillator: Secondary | ICD-10-CM

## 2019-03-20 LAB — GLUCOSE, CAPILLARY: Glucose-Capillary: 156 mg/dL — ABNORMAL HIGH (ref 70–99)

## 2019-03-20 MED ORDER — ROSUVASTATIN CALCIUM 10 MG PO TABS: 10.0000 mg | ORAL_TABLET | Freq: Every day | ORAL | 6 refills | Status: DC

## 2019-03-20 MED FILL — ROSUVASTATIN CALCIUM 10 MG: 10 | 30 days supply | Qty: 30 | Fill #0

## 2019-03-20 NOTE — Discharge Instructions (Signed)
° ° °  Supplemental Discharge Instructions for  Defibrillator Extraction  WOUND CARE - Keep the wound area clean and dry.  Do not get this area wet for 5 days. No showers for 5 days; you may shower on  03/25/19   . - The tape/steri-strips on your wound will fall off; do not pull them off.  No bandage is needed on the site.  DO  NOT apply any creams, oils, or ointments to the wound area. - If you notice any drainage or discharge from the wound, any swelling or bruising at the site, or you develop a fever > 101? F after you are discharged home, call the office at once.  Otherwise, Dr. Curt Bears feels you can return to regular activities as tolerated. You may return to work on Monday 03/22/19 if you are feeling well.

## 2019-03-20 NOTE — Anesthesia Postprocedure Evaluation (Signed)
Anesthesia Post Note  Patient: Paul [name]  Procedure(s) Performed: ICD LEAD AND GENERATOR REMOVAL (N/A Chest)     Patient location during evaluation: PACU Anesthesia Type: General Level of consciousness: awake and alert Pain management: pain level controlled Vital Signs Assessment: post-procedure vital signs reviewed and stable Respiratory status: spontaneous breathing, nonlabored ventilation, respiratory function stable and patient connected to nasal cannula oxygen Cardiovascular status: blood pressure returned to baseline and stable Postop Assessment: no apparent nausea or vomiting Anesthetic complications: no    Last Vitals:  Vitals:   03/20/19 0421 03/20/19 0607  BP: 108/74   Pulse: 93   Resp:    Temp:  36.9 C  SpO2: 93%     Last Pain:  Vitals:   03/20/19 0748  TempSrc:   PainSc: 0-No pain                 Catalina Gravel

## 2019-03-20 NOTE — Discharge Summary (Addendum)
Discharge Summary    Patient ID: CHASTON BRADBURN MRN: [id number]; DOB: [birth year]  Admit date: 03/19/2019 Discharge date: 03/20/2019  Primary Care Provider: Patient, No Pcp Per  Primary Cardiologist: Loralie Champagne, MD  Primary Electrophysiologist:  Virl Axe, MD   Discharge Diagnoses    Principal Problem:   ICD (implantable cardioverter-defibrillator) in place - removed 03/19/19 Active Problems:   Cardiomyopathy, dilated, nonischemic ([place])   Aftercare following removal or replacement of defibrillator   Allergies Allergies  Allergen Reactions   Lisinopril Swelling    angioedema    Diagnostic Studies/Procedures    1. ICD extraction 03/19/19. See full report for details. _____________   History of Present Illness     Mr. Chui is a 73 y/o M with h/o nonischemic cardiomyopathy (possibly PVC-related) and frequent PVCs, CAD, HTN, DM, GERD, CKD stage III, OSA, DVT 2015 who presented for planned device extraction. See Dr. Claris Gladden comprehensive note from 02/24/19 regarding extensive prior cardiac history. He previously had defibrillator placed in the context of NICM. Last echo in 08/2018 showed normalized LVEF and  Zio patch in 6/20 showed PVCs down to 2.5% of total beats. He was doing well at last follow-up. His device had reached end of life. Given that his EF has improved and he had not required prior therapies, the patient discussed plan with EP and the decision was made for device extraction.   Hospital Course     He underwent this procedure yesterday and did well overnight without any complications. Wound care instructions outlined on AVS per my discussion with Dr. Curt Bears. He has seen and examined the patient today and feels he is stable for discharge. Wound check has been scheduled as below. Dr. Curt Bears also recommended a f/u appt with Dr. Caryl Comes in 3 months so I sent a message to schedulers to arrange. Refill for Crestor was sent in per pt request to [facility]. Dr.  Curt Bears Abdias Hickam include final physical exam in his addendum below. _____________  Discharge Vitals Blood pressure 108/74, pulse 93, temperature 98.4 F (36.9 C), temperature source Oral, resp. rate 13, height '6\' 1"'  (1.854 m), weight 96.9 kg, SpO2 93 %.  Filed Weights   03/19/19 1242 03/20/19 0607  Weight: 96.6 kg 96.9 kg    Labs & Radiologic Studies   _____________  No results found. Disposition   Pt is being discharged home today in good condition.  Follow-up Plans & Appointments    Follow-up Information    [facility] Follow up.   Specialty: Cardiology Why: [facility] - see below for wound check appointment information 04/01/19. The office Bronc Brosseau also call you to arrange follow-up with Dr. Caryl Comes in 3 months. Contact information: [address], [place] [phone]         Discharge Instructions    Diet - low sodium heart healthy   Complete by: As directed    Discharge instructions   Complete by: As directed    Please see attached sheet at the end of your After-Visit Summary for instructions on wound care, activity, and bathing.   Increase activity slowly   Complete by: As directed       Discharge Medications   Allergies as of 03/20/2019      Reactions   Lisinopril Swelling   angioedema      Medication List    TAKE these medications   acetaminophen 500 MG tablet Commonly known as: TYLENOL Take 1,000 mg by mouth every 6 (six)  hours as needed for moderate pain or headache.   aspirin 81 MG EC tablet Commonly known as: Aspirin 81 Take 1 tablet (81 mg total) by mouth daily. Swallow whole.   carvedilol 6.25 MG tablet Commonly known as: COREG Take 6.25 mg by mouth 2 (two) times daily with a meal.   Dulaglutide 1.5 MG/0.5ML Sopn Commonly known as: Trulicity Inject 1.5 mg into the skin once a week.   empagliflozin 10 MG Tabs tablet Commonly known as: Jardiance Take 10 mg by mouth daily.     glucose blood test strip Commonly known as: OneTouch Verio Use to check blood sugar 3 times a day.   Insulin Glargine 100 UNIT/ML Solostar Pen Commonly known as: Lantus SoloStar Inject 38 Units into the skin at bedtime.   Lancets Misc Glucose monitor to check blood sugars tid dx code E11.9 brand per insurance coverage   losartan 25 MG tablet Commonly known as: COZAAR Take 1 tablet (25 mg total) by mouth daily.   metFORMIN 500 MG 24 hr tablet Commonly known as: GLUCOPHAGE-XR Take 4 tablets (2,000 mg total) by mouth daily with breakfast. What changed: when to take this   OneTouch Verio w/Device Kit Use to check blood sugar 3 times a day.   rosuvastatin 10 MG tablet Commonly known as: CRESTOR Take 1 tablet (10 mg total) by mouth daily.   spironolactone 25 MG tablet Commonly known as: ALDACTONE TAKE 1 TABLET BY MOUTH ONCE DAILY   tamsulosin 0.4 MG Caps capsule Commonly known as: FLOMAX Take 0.4 mg by mouth at bedtime.   Unifine Pentips 32G X 4 MM Misc Generic drug: Insulin Pen Needle USE TWICE DAILY WITH LANTUS AND VICTOZA        Acute coronary syndrome (MI, NSTEMI, STEMI, etc) this admission?: No.    Outstanding Labs/Studies   N/A  Duration of Discharge Encounter   Greater than 30 minutes including physician time.  Signed, Charlie Pitter, PA-C 03/20/2019, 8:48 AM   I have seen and examined this patient with Melina Copa.  Agree with above, note added to reflect my findings.  On exam, RRR, no murmurs, lungs clear. S/p ICD extraction. Plan for dischage today with follow up in clinic.    Ira Dougher M. Sherrilynn Gudgel MD 03/20/2019 9:17 AM

## 2019-03-22 ENCOUNTER — Encounter (HOSPITAL_COMMUNITY): Payer: Self-pay | Admitting: Internal Medicine

## 2019-03-22 LAB — TYPE AND SCREEN
ABO/RH(D): A POS
Antibody Screen: NEGATIVE
Unit division: 0
Unit division: 0

## 2019-03-22 LAB — BPAM RBC
Blood Product Expiration Date: 202011012359
Blood Product Expiration Date: 202011012359
Unit Type and Rh: 6200
Unit Type and Rh: 6200

## 2019-03-23 MED FILL — JARDIANCE 10 MG TABLET: 10 | 30 days supply | Qty: 30 | Fill #6

## 2019-03-23 MED FILL — LOSARTAN POTASSIUM 25 MG TA: 25 | 30 days supply | Qty: 30 | Fill #1

## 2019-03-23 MED FILL — CARVEDILOL 6.25 MG TABLET: 6.25 | 30 days supply | Qty: 90 | Fill #7

## 2019-03-29 ENCOUNTER — Other Ambulatory Visit: Payer: Self-pay | Admitting: Orthopedic Surgery

## 2019-03-31 ENCOUNTER — Other Ambulatory Visit: Payer: Self-pay | Admitting: Orthopedic Surgery

## 2019-04-01 ENCOUNTER — Ambulatory Visit: Payer: BC Managed Care – PPO

## 2019-04-08 ENCOUNTER — Other Ambulatory Visit: Payer: Self-pay | Admitting: Internal Medicine

## 2019-04-08 MED FILL — metFORMIN HCL ER 500 MG TB2: 500 | 30 days supply | Qty: 120 | Fill #3

## 2019-04-08 MED FILL — TRULICITY 1.5 MG/0.5 ML PEN: 1.5 | 28 days supply | Qty: 2 | Fill #0

## 2019-04-08 MED FILL — SPIRONOLACTONE 25 MG TABS: 25 | 30 days supply | Qty: 30 | Fill #5

## 2019-04-08 MED FILL — LANTUS SOLOSTAR 100 UNITS/M: 100 | 30 days supply | Qty: 12 | Fill #9

## 2019-04-21 ENCOUNTER — Other Ambulatory Visit: Payer: Self-pay | Admitting: Physician Assistant

## 2019-04-21 MED FILL — TAMSULOSIN HCL 0.4 MG CAP: 0.4 | 30 days supply | Qty: 30 | Fill #6

## 2019-04-21 MED FILL — LOSARTAN POTASSIUM 25 MG TA: 25 | 30 days supply | Qty: 30 | Fill #2

## 2019-04-21 MED FILL — JARDIANCE 10 MG TABLET: 10 | 30 days supply | Qty: 30 | Fill #7

## 2019-04-22 MED FILL — ROSUVASTATIN CALCIUM 10 MG: 10 | 30 days supply | Qty: 30 | Fill #0

## 2019-04-22 NOTE — Telephone Encounter (Signed)
Please advise if ok to refill Crestor 10 mg qd. Last filled in Hospital.

## 2019-05-04 ENCOUNTER — Other Ambulatory Visit (HOSPITAL_COMMUNITY)
Admission: RE | Admit: 2019-05-04 | Discharge: 2019-05-04 | Disposition: A | Discharge status: Home / Self Care | Payer: BC Managed Care – PPO | Source: Ambulatory Visit | Attending: Orthopedic Surgery | Admitting: Orthopedic Surgery

## 2019-05-04 DIAGNOSIS — Z01812 Encounter for preprocedural laboratory examination: Secondary | ICD-10-CM | POA: Diagnosis not present

## 2019-05-04 DIAGNOSIS — Z20828 Contact with and (suspected) exposure to other viral communicable diseases: Secondary | ICD-10-CM | POA: Diagnosis not present

## 2019-05-04 NOTE — Patient Instructions (Addendum)
DUE TO COVID-19 ONLY ONE VISITOR IS ALLOWED TO COME WITH YOU AND STAY IN THE WAITING ROOM ONLY DURING PRE OP AND PROCEDURE. THE ONE VISITOR MAY VISIT WITH YOU IN YOUR PRIVATE ROOM DURING VISITING HOURS ONLY!!   COVID SWAB TESTING COMPLETED ON:  05/04/2019    (Must self quarantine after testing. Follow instructions on handout.)          Your procedure is scheduled on: 05/07/2019   Report to [facility] Main  Entrance    Report to Short Stay at 5:30 AM   Call this number if you have problems the morning of surgery [phone]   Do not eat food :After Midnight.   May have liquids until 4:15 AM   CLEAR LIQUID DIET  Foods Allowed                                                                     Foods Excluded  Water, Black Coffee and tea, regular and decaf                             liquids that you cannot  Plain Jell-O in any flavor  (No red)                                           see through such as: Fruit ices (not with fruit pulp)                                     milk, soups, orange juice  Iced Popsicles (No red)                                    All solid food Carbonated beverages, regular and diet                                    Apple juices Sports drinks like Gatorade (No red) Lightly seasoned clear broth or consume(fat free) Sugar, honey syrup  Sample Menu Breakfast                                Lunch                                     Supper Cranberry juice                    Beef broth                            Chicken broth Jell-O  Grape juice                           Apple juice Coffee or tea                        Jell-O                                      Popsicle                                                Coffee or tea                        Coffee or tea   Complete G2 drink the morning of surgery at  4:15 AM the day of surgery.   Brush your teeth the morning of surgery.   Do NOT smoke after  Midnight   Take these medicines the morning of surgery with A SIP OF WATER: Crestor,Coreg.  DO NOT TAKE ANY DIABETIC MEDICATIONS DAY OF YOUR SURGERY                               You may not have any metal on your body including jewelry, and body piercings             Do not wear lotions, powders, perfumes/cologne, or deodorant                         Men may shave face and neck.   Do not bring valuables to the hospital. [place].   Contacts, dentures or bridgework may not be worn into surgery.   Bring small overnight bag day of surgery.    Special Instructions: Bring a copy of your healthcare power of attorney and living will documents         the day of surgery if you haven't scanned them in before.              Please read over the following fact sheets you were given:  How to Manage Your Diabetes Before and After Surgery  Why is it important to control my blood sugar before and after surgery? . Improving blood sugar levels before and after surgery helps healing and can limit problems. . A way of improving blood sugar control is eating a healthy diet by: o  Eating less sugar and carbohydrates o  Increasing activity/exercise o  Talking with your doctor about reaching your blood sugar goals . High blood sugars (greater than 180 mg/dL) can raise your risk of infections and slow your recovery, so you will need to focus on controlling your diabetes during the weeks before surgery. . Make sure that the doctor who takes care of your diabetes knows about your planned surgery including the date and location.  How do I manage my blood sugar before surgery? . Check your blood sugar at least 4 times a day, starting 2 days before surgery, to make sure that the level is  not too high or low. o Check your blood sugar the morning of your surgery when you wake up and every 2 hours until you get to the Short Stay unit. . If your blood sugar is  less than 70 mg/dL, you will need to treat for low blood sugar: o Do not take insulin. o Treat a low blood sugar (less than 70 mg/dL) with  cup of clear juice (cranberry or apple), 4 glucose tablets, OR glucose gel. o Recheck blood sugar in 15 minutes after treatment (to make sure it is greater than 70 mg/dL). If your blood sugar is not greater than 70 mg/dL on recheck, call [phone] for further instructions. . Report your blood sugar to the short stay nurse when you get to Short Stay.  . If you are admitted to the hospital after surgery: o Your blood sugar will be checked by the staff and you will probably be given insulin after surgery (instead of oral diabetes medicines) to make sure you have good blood sugar levels. o The goal for blood sugar control after surgery is 80-180 mg/dL.   WHAT DO I DO ABOUT MY DIABETES MEDICATION?  Marland Kitchen Do not take oral diabetes medicines (pills) the morning of surgery.             Take your Victoza as usual .              Take your Metformin the day before of surgery,as usual.   . THE NIGHT BEFORE SURGERY, take: 19  units of  Lantus  insulin.      . The day of surgery, do not take other diabetes injectables, including Byetta (exenatide), Bydureon (exenatide ER), Victoza (liraglutide), or Trulicity (dulaglutide).  Reviewed and Endorsed by [facility] Patient Education Committee, August 2015  [facility] - Preparing for Surgery Before surgery, you can play an important role.  Because skin is not sterile, your skin needs to be as free of germs as possible.  You can reduce the number of germs on your skin by washing with CHG (chlorahexidine gluconate) soap before surgery.  CHG is an antiseptic cleaner which kills germs and bonds with the skin to continue killing germs even after washing. Please DO NOT use if you have an allergy to CHG or antibacterial soaps.  If your skin becomes reddened/irritated stop using the CHG and inform your nurse when you arrive at  Short Stay. Do not shave (including legs and underarms) for at least 48 hours prior to the first CHG shower.  You may shave your face/neck.  Please follow these instructions carefully:  1.  Shower with CHG Soap the night before surgery and the  morning of surgery.  2.  If you choose to wash your hair, wash your hair first as usual with your normal  shampoo.  3.  After you shampoo, rinse your hair and body thoroughly to remove the shampoo.                             4.  Use CHG as you would any other liquid soap.  You can apply chg directly to the skin and wash.  Gently with a scrungie or clean washcloth.  5.  Apply the CHG Soap to your body ONLY FROM THE NECK DOWN.   Do   not use on face/ open  Wound or open sores. Avoid contact with eyes, ears mouth and   genitals (private parts).                       Wash face,  Genitals (private parts) with your normal soap.             6.  Wash thoroughly, paying special attention to the area where your    surgery  will be performed.  7.  Thoroughly rinse your body with warm water from the neck down.  8.  DO NOT shower/wash with your normal soap after using and rinsing off the CHG Soap.                9.  Pat yourself dry with a clean towel.            10.  Wear clean pajamas.            11.  Place clean sheets on your bed the night of your first shower and do not  sleep with pets. Day of Surgery : Do not apply any lotions/deodorants the morning of surgery.  Please wear clean clothes to the hospital/surgery center.  FAILURE TO FOLLOW THESE INSTRUCTIONS MAY RESULT IN THE CANCELLATION OF YOUR SURGERY  PATIENT SIGNATURE_________________________________  NURSE SIGNATURE__________________________________  ________________________________________________________________________   Adam Phenix  An incentive spirometer is a tool that can help keep your lungs clear and active. This tool measures how well you are filling  your lungs with each breath. Taking long deep breaths may help reverse or decrease the chance of developing breathing (pulmonary) problems (especially infection) following:  A long period of time when you are unable to move or be active. BEFORE THE PROCEDURE   If the spirometer includes an indicator to show your best effort, your nurse or respiratory therapist will set it to a desired goal.  If possible, sit up straight or lean slightly forward. Try not to slouch.  Hold the incentive spirometer in an upright position. INSTRUCTIONS FOR USE  1. Sit on the edge of your bed if possible, or sit up as far as you can in bed or on a chair. 2. Hold the incentive spirometer in an upright position. 3. Breathe out normally. 4. Place the mouthpiece in your mouth and seal your lips tightly around it. 5. Breathe in slowly and as deeply as possible, raising the piston or the ball toward the top of the column. 6. Hold your breath for 3-5 seconds or for as long as possible. Allow the piston or ball to fall to the bottom of the column. 7. Remove the mouthpiece from your mouth and breathe out normally. 8. Rest for a few seconds and repeat Steps 1 through 7 at least 10 times every 1-2 hours when you are awake. Take your time and take a few normal breaths between deep breaths. 9. The spirometer may include an indicator to show your best effort. Use the indicator as a goal to work toward during each repetition. 10. After each set of 10 deep breaths, practice coughing to be sure your lungs are clear. If you have an incision (the cut made at the time of surgery), support your incision when coughing by placing a pillow or rolled up towels firmly against it. Once you are able to get out of bed, walk around indoors and cough well. You may stop using the incentive spirometer when instructed by your caregiver.  RISKS AND COMPLICATIONS  Take your time  so you do not get dizzy or light-headed.  If you are in pain, you may  need to take or ask for pain medication before doing incentive spirometry. It is harder to take a deep breath if you are having pain. AFTER USE  Rest and breathe slowly and easily.  It can be helpful to keep track of a log of your progress. Your caregiver can provide you with a simple table to help with this. If you are using the spirometer at home, follow these instructions: [place] IF:   You are having difficultly using the spirometer.  You have trouble using the spirometer as often as instructed.  Your pain medication is not giving enough relief while using the spirometer.  You develop fever of 100.5 F (38.1 C) or higher. SEEK IMMEDIATE MEDICAL CARE IF:   You cough up bloody sputum that had not been present before.  You develop fever of 102 F (38.9 C) or greater.  You develop worsening pain at or near the incision site. MAKE SURE YOU:   Understand these instructions.  Will watch your condition.  Will get help right away if you are not doing well or get worse. Document Released: 10/14/2006 Document Revised: 08/26/2011 Document Reviewed: 12/15/2006 ExitCare Patient Information 2014 [place].   ________________________________________________________________________   Incentive Spirometer  An incentive spirometer is a tool that can help keep your lungs clear and active. This tool measures how well you are filling your lungs with each breath. Taking long deep breaths may help reverse or decrease the chance of developing breathing (pulmonary) problems (especially infection) following:  A long period of time when you are unable to move or be active. BEFORE THE PROCEDURE   If the spirometer includes an indicator to show your best effort, your nurse or respiratory therapist will set it to a desired goal.  If possible, sit up straight or lean slightly forward. Try not to slouch.  Hold the incentive spirometer in an upright position. INSTRUCTIONS FOR USE   11. Sit on the edge of your bed if possible, or sit up as far as you can in bed or on a chair. 12. Hold the incentive spirometer in an upright position. 13. Breathe out normally. 14. Place the mouthpiece in your mouth and seal your lips tightly around it. 15. Breathe in slowly and as deeply as possible, raising the piston or the ball toward the top of the column. 16. Hold your breath for 3-5 seconds or for as long as possible. Allow the piston or ball to fall to the bottom of the column. 17. Remove the mouthpiece from your mouth and breathe out normally. 18. Rest for a few seconds and repeat Steps 1 through 7 at least 10 times every 1-2 hours when you are awake. Take your time and take a few normal breaths between deep breaths. 19. The spirometer may include an indicator to show your best effort. Use the indicator as a goal to work toward during each repetition. 20. After each set of 10 deep breaths, practice coughing to be sure your lungs are clear. If you have an incision (the cut made at the time of surgery), support your incision when coughing by placing a pillow or rolled up towels firmly against it. Once you are able to get out of bed, walk around indoors and cough well. You may stop using the incentive spirometer when instructed by your caregiver.  RISKS AND COMPLICATIONS  Take your time so you do not get  dizzy or light-headed.  If you are in pain, you may need to take or ask for pain medication before doing incentive spirometry. It is harder to take a deep breath if you are having pain. AFTER USE  Rest and breathe slowly and easily.  It can be helpful to keep track of a log of your progress. Your caregiver can provide you with a simple table to help with this. If you are using the spirometer at home, follow these instructions: [place] IF:   You are having difficultly using the spirometer.  You have trouble using the spirometer as often as instructed.  Your pain  medication is not giving enough relief while using the spirometer.  You develop fever of 100.5 F (38.1 C) or higher. SEEK IMMEDIATE MEDICAL CARE IF:   You cough up bloody sputum that had not been present before.  You develop fever of 102 F (38.9 C) or greater.  You develop worsening pain at or near the incision site. MAKE SURE YOU:   Understand these instructions.  Will watch your condition.  Will get help right away if you are not doing well or get worse. Document Released: 10/14/2006 Document Revised: 08/26/2011 Document Reviewed: 12/15/2006 [facility] Patient Information 2014 [place], [place].   ________________________________________________________________________

## 2019-05-05 ENCOUNTER — Other Ambulatory Visit: Payer: Self-pay

## 2019-05-05 ENCOUNTER — Encounter (HOSPITAL_COMMUNITY): Payer: Self-pay

## 2019-05-05 ENCOUNTER — Ambulatory Visit (HOSPITAL_COMMUNITY)
Admission: RE | Admit: 2019-05-05 | Discharge: 2019-05-05 | Disposition: A | Discharge status: Home / Self Care | Payer: BC Managed Care – PPO | Source: Ambulatory Visit | Attending: Orthopedic Surgery | Admitting: Orthopedic Surgery

## 2019-05-05 ENCOUNTER — Encounter (HOSPITAL_COMMUNITY)
Admission: RE | Admit: 2019-05-05 | Discharge: 2019-05-05 | Disposition: A | Discharge status: Home / Self Care | Payer: BC Managed Care – PPO | Source: Ambulatory Visit | Attending: Orthopedic Surgery | Admitting: Orthopedic Surgery

## 2019-05-05 DIAGNOSIS — Z01811 Encounter for preprocedural respiratory examination: Secondary | ICD-10-CM | POA: Insufficient documentation

## 2019-05-05 DIAGNOSIS — K219 Gastro-esophageal reflux disease without esophagitis: Secondary | ICD-10-CM | POA: Diagnosis not present

## 2019-05-05 DIAGNOSIS — G4730 Sleep apnea, unspecified: Secondary | ICD-10-CM | POA: Insufficient documentation

## 2019-05-05 DIAGNOSIS — Z86718 Personal history of other venous thrombosis and embolism: Secondary | ICD-10-CM | POA: Insufficient documentation

## 2019-05-05 DIAGNOSIS — I2510 Atherosclerotic heart disease of native coronary artery without angina pectoris: Secondary | ICD-10-CM | POA: Diagnosis not present

## 2019-05-05 DIAGNOSIS — Z9842 Cataract extraction status, left eye: Secondary | ICD-10-CM | POA: Insufficient documentation

## 2019-05-05 DIAGNOSIS — M1711 Unilateral primary osteoarthritis, right knee: Secondary | ICD-10-CM | POA: Diagnosis not present

## 2019-05-05 DIAGNOSIS — I5022 Chronic systolic (congestive) heart failure: Secondary | ICD-10-CM | POA: Insufficient documentation

## 2019-05-05 DIAGNOSIS — Z7982 Long term (current) use of aspirin: Secondary | ICD-10-CM | POA: Diagnosis not present

## 2019-05-05 DIAGNOSIS — Z794 Long term (current) use of insulin: Secondary | ICD-10-CM | POA: Insufficient documentation

## 2019-05-05 DIAGNOSIS — Z79899 Other long term (current) drug therapy: Secondary | ICD-10-CM | POA: Diagnosis not present

## 2019-05-05 DIAGNOSIS — Z961 Presence of intraocular lens: Secondary | ICD-10-CM | POA: Diagnosis not present

## 2019-05-05 DIAGNOSIS — Z9841 Cataract extraction status, right eye: Secondary | ICD-10-CM | POA: Diagnosis not present

## 2019-05-05 DIAGNOSIS — I130 Hypertensive heart and chronic kidney disease with heart failure and stage 1 through stage 4 chronic kidney disease, or unspecified chronic kidney disease: Secondary | ICD-10-CM | POA: Insufficient documentation

## 2019-05-05 DIAGNOSIS — Z95810 Presence of automatic (implantable) cardiac defibrillator: Secondary | ICD-10-CM | POA: Insufficient documentation

## 2019-05-05 DIAGNOSIS — N183 Chronic kidney disease, stage 3 unspecified: Secondary | ICD-10-CM | POA: Insufficient documentation

## 2019-05-05 DIAGNOSIS — I428 Other cardiomyopathies: Secondary | ICD-10-CM | POA: Insufficient documentation

## 2019-05-05 DIAGNOSIS — E1122 Type 2 diabetes mellitus with diabetic chronic kidney disease: Secondary | ICD-10-CM | POA: Diagnosis not present

## 2019-05-05 HISTORY — DX: Chronic systolic (congestive) heart failure: I50.22

## 2019-05-05 LAB — URINALYSIS, ROUTINE W REFLEX MICROSCOPIC
Bacteria, UA: NONE SEEN
Bilirubin Urine: NEGATIVE
Glucose, UA: >=500 mg/dL — AB
Hgb urine dipstick: NEGATIVE
Ketones, ur: NEGATIVE mg/dL
Leukocytes,Ua: NEGATIVE
Nitrite: NEGATIVE
Protein, ur: NEGATIVE mg/dL
Specific Gravity, Urine: 1.01 (ref 1.005–1.030)
pH: 5 (ref 5.0–8.0)

## 2019-05-05 LAB — COMPREHENSIVE METABOLIC PANEL WITH GFR
ALT: 19 U/L (ref 0–44)
AST: 20 U/L (ref 15–41)
Albumin: 4 g/dL (ref 3.5–5.0)
Alkaline Phosphatase: 86 U/L (ref 38–126)
Anion gap: 8 (ref 5–15)
BUN: 26 mg/dL — ABNORMAL HIGH (ref 8–23)
CO2: 27 mmol/L (ref 22–32)
Calcium: 8.7 mg/dL — ABNORMAL LOW (ref 8.9–10.3)
Chloride: 99 mmol/L (ref 98–111)
Creatinine, Ser: 1.19 mg/dL (ref 0.61–1.24)
GFR calc Af Amer: >60 mL/min
GFR calc non Af Amer: >60 mL/min
Glucose, Bld: 228 mg/dL — ABNORMAL HIGH (ref 70–99)
Potassium: 4.5 mmol/L (ref 3.5–5.1)
Sodium: 134 mmol/L — ABNORMAL LOW (ref 135–145)
Total Bilirubin: 0.7 mg/dL (ref 0.3–1.2)
Total Protein: 6.6 g/dL (ref 6.5–8.1)

## 2019-05-05 LAB — CBC WITH DIFFERENTIAL/PLATELET
Abs Immature Granulocytes: 0.03 10*3/uL (ref 0.00–0.07)
Basophils Absolute: 0.1 10*3/uL (ref 0.0–0.1)
Basophils Relative: 1 %
Eosinophils Absolute: 0.3 10*3/uL (ref 0.0–0.5)
Eosinophils Relative: 4 %
HCT: 44.3 % (ref 39.0–52.0)
Hemoglobin: 14 g/dL (ref 13.0–17.0)
Immature Granulocytes: 0 %
Lymphocytes Relative: 16 %
Lymphs Abs: 1.3 10*3/uL (ref 0.7–4.0)
MCH: 29.9 pg (ref 26.0–34.0)
MCHC: 31.6 g/dL (ref 30.0–36.0)
MCV: 94.5 fL (ref 80.0–100.0)
Monocytes Absolute: 0.4 10*3/uL (ref 0.1–1.0)
Monocytes Relative: 5 %
Neutro Abs: 6 10*3/uL (ref 1.7–7.7)
Neutrophils Relative %: 74 %
Platelets: 194 10*3/uL (ref 150–400)
RBC: 4.69 MIL/uL (ref 4.22–5.81)
RDW: 13.9 % (ref 11.5–15.5)
WBC: 8.1 10*3/uL (ref 4.0–10.5)
nRBC: 0 % (ref 0.0–0.2)

## 2019-05-05 LAB — SURGICAL PCR SCREEN
MRSA, PCR: NEGATIVE — AB
Staphylococcus aureus: NEGATIVE — AB

## 2019-05-05 LAB — NOVEL CORONAVIRUS, NAA (HOSP ORDER, SEND-OUT TO REF LAB; TAT 18-24 HRS): SARS-CoV-2, NAA: NOT DETECTED

## 2019-05-05 LAB — PROTIME-INR
INR: 1 (ref 0.8–1.2)
Prothrombin Time: 12.6 s (ref 11.4–15.2)

## 2019-05-05 LAB — GLUCOSE, CAPILLARY: Glucose-Capillary: 181 mg/dL — ABNORMAL HIGH (ref 70–99)

## 2019-05-05 LAB — APTT: aPTT: 32 seconds (ref 24–36)

## 2019-05-05 MED ORDER — CHLORHEXIDINE GLUCONATE 4 % EX LIQD
60.0000 mL | Freq: Once | CUTANEOUS | Status: DC
  Filled 2019-05-05: qty 60

## 2019-05-05 NOTE — Pre-Procedure Instructions (Signed)
PCP - Pt. Does not have one at the moment. Cardiologist - Dr. Trey Paula  Chest x-ray - schedule today EKG -  Stress Test -  ECHO - 03/19/2019 Cardiac Cath -   Sleep Study -  CPAP - Pt. Was advise to bring his mask and tubing,he verbalized that he does not want to bring it.  Fasting Blood Sugar -  Checks Blood Sugar ___3__ times a day  Blood Thinner Instructions: Aspirin Instructions:Pt. Was advise to check with his MD. When he needs to stop taking Aspirin Last Dose:  Anesthesia review: Removal of ICD,Nonschimic cardiomyopathy,frequent PVC's,CAD,HTN,DM,Sleep Apnea.  Patient denies shortness of breath, fever, cough and chest pain at PAT appointment   Patient verbalized understanding of instructions that were given to them at the PAT appointment. Patient was also instructed that they will need to review over the PAT instructions again at home before surgery.

## 2019-05-06 ENCOUNTER — Encounter (HOSPITAL_COMMUNITY): Payer: Self-pay | Admitting: Anesthesiology

## 2019-05-06 ENCOUNTER — Other Ambulatory Visit (HOSPITAL_COMMUNITY): Payer: Self-pay | Admitting: Cardiology

## 2019-05-06 MED ORDER — BUPIVACAINE LIPOSOME 1.3 % IJ SUSP
20.0000 mL | INTRAMUSCULAR | Status: DC
  Filled 2019-05-06: qty 20

## 2019-05-06 MED FILL — LANTUS SOLOSTAR 100 UNITS/M: 100 | 30 days supply | Qty: 12 | Fill #10

## 2019-05-06 MED FILL — TRULICITY 1.5 MG/0.5 ML PEN: 1.5 | 28 days supply | Qty: 2 | Fill #1

## 2019-05-06 MED FILL — METFORMIN HCL ER 500 MG TB2: 500 | 30 days supply | Qty: 120 | Fill #4

## 2019-05-06 NOTE — Progress Notes (Addendum)
Anesthesia Chart Review   Case: [id number] Date/Time: 05/07/19 0700   Procedure: TOTAL KNEE ARTHROPLASTY (Right Knee)   Anesthesia type: Spinal   Pre-op diagnosis: OSTEOARTHRITIS RIGHT KNEE   Location: [place] [facility] / [facility]   Surgeon: Dorna Leitz, MD      DISCUSSION:73 y.o. 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 Dorna Leitz.   ICD system extracted 03/20/2019.  Pt with h/o non-ischemic CM and PVC's who underwent ICD insertion in 2001. He has done well in the interim and his EF has improved and he has not received any ICD therapies.  Prior to extraction seen by cardiologist, Dr. Loralie Champagne, 02/24/2019.  Stable at this visit with 6 month follow up recommended.    Cleared by PCP, Dr. Philemon Kingdom, 02/25/2019 (under media).    Cleared by cardiologist, Dr. Aundra Dubin, 03/05/2019 (under media).   Blood glucose 228 at PAT visit, last A1C 6.8 02/25/2019.   VS: BP (!) 146/84   Pulse 77   Temp 36.8 C (Oral)   Resp 16   Ht '5\' 11"'  (1.803 m)   Wt 100.2 kg   SpO2 100%   BMI 30.82 kg/m   PROVIDERS: Patient, No Pcp Per  Loralie Champagne, MD is Primary cardiologist  Virl Axe, MD is Electrophysiologist  LABS: Labs reviewed: Acceptable for surgery. (all labs ordered are listed, but only abnormal results are displayed)  Labs Reviewed  SURGICAL PCR SCREEN - Abnormal; Notable for the following components:      Result Value   MRSA, PCR NEGATIVE (*)    Staphylococcus aureus NEGATIVE (*)    All other components within normal limits  COMPREHENSIVE METABOLIC PANEL - Abnormal; Notable for the following components:   Sodium 134 (*)    Glucose, Bld 228 (*)    BUN 26 (*)    Calcium 8.7 (*)     All other components within normal limits  URINALYSIS, ROUTINE W REFLEX MICROSCOPIC - Abnormal; Notable for the following components:   Glucose, UA >=500 (*)    All other components within normal limits  GLUCOSE, CAPILLARY - Abnormal; Notable for the following components:   Glucose-Capillary 181 (*)    All other components within normal limits  APTT  CBC WITH DIFFERENTIAL/PLATELET  PROTIME-INR  TYPE AND SCREEN     IMAGES: Chest Xray 05/05/2019 FINDINGS: There has been interval removal of the AICD. The lungs are clear. There is no pleural effusion or pneumothorax. The cardiac silhouette is within normal limits. The aorta is tortuous. No acute osseous pathology.  IMPRESSION: No active cardiopulmonary disease.  EKG: 05/05/2019 Rate 77 bpm  Sinus rhythm with Premature atrial complexes Left axis deviation Right bundle branch block Abnormal ECG No significant change since last tracing  CV: Echo 09/04/2018 IMPRESSIONS   1. The left ventricle has normal systolic function, with an ejection fraction of 55-60%. The cavity size was normal. Left ventricular diastolic Doppler parameters are consistent with impaired relaxation.  2. The right ventricle has normal systolic function. The cavity was normal. There is no increase in right ventricular wall thickness.  3. When compared to the prior study: EF now appears normal when compared to prior (45%). Past Medical History:  Diagnosis Date  .  Aftercare following removal/replacement defibrillator    a. defibrillator removed 03/2019.  Marland Kitchen Arthritis    "thumbs, neck, knees" (06/01/2013)  . Cardiomyopathy, dilated, nonischemic ([place]) 10/20/2012  . Cataract   . CHF (congestive heart failure) ([place])   . Chronic systolic heart failure ([place])   . CKD (chronic kidney disease), stage III   . Coronary artery disease   . DVT (deep vein thrombosis) in pregnancy   . 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  . Erectile dysfunction   .  Frequent PVCs   . GERD (gastroesophageal reflux disease)   . Hearing aid worn    B/L  . Hypertension   . Hypogonadism male   . Hypovitaminosis D   . 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 NICM (nonischemic cardiomyopathy) ([place])   . Sleep apnea    cpap  . Type II diabetes mellitus ([place])   . Ventricular tachycardia ([place])    observed during stress testing, subsequent VT on EPS  . Wears glasses     Past Surgical History:  Procedure Laterality Date  . ABLATION  06-01-2013   PVC's ablated along basal inferoseptal RV by Dr Rayann Heman  . APPENDECTOMY  1969  . CARDIAC CATHETERIZATION  04/2010  . CARDIAC DEFIBRILLATOR PLACEMENT  05/09/2010   MDT ICD implanted in [place] by Dr Ky Barban  . CATARACT EXTRACTION W/ INTRAOCULAR LENS  IMPLANT, BILATERAL Bilateral   . ICD LEAD REMOVAL N/A 03/19/2019   Procedure: ICD LEAD AND GENERATOR REMOVAL;  Surgeon: Evans Lance, MD;  Location: [place];  Service: Cardiovascular;  Laterality: N/A;  . KNEE ARTHROSCOPY Left 1984  . LUMBAR LAMINECTOMY  ~ 2004  . TONSILLECTOMY AND ADENOIDECTOMY  1972  . TOTAL KNEE ARTHROPLASTY Left 04/28/2017  . TOTAL KNEE ARTHROPLASTY Left 04/28/2017   Procedure: TOTAL KNEE ARTHROPLASTY LEFT;  Surgeon: Dorna Leitz, MD;  Location: [place];  Service: Orthopedics;  Laterality: Left;  . V-TACH ABLATION N/A 06/01/2013   Procedure: V-TACH ABLATION;  Surgeon: Coralyn Mark, MD;  Location: [facility] CATH LAB;  Service: Cardiovascular;  Laterality: N/A;  . VASECTOMY    . VENTRICULAR ABLATION SURGERY  06/01/2013    MEDICATIONS: . acetaminophen (TYLENOL) 500 MG tablet  . aspirin (ASPIRIN 81) 81 MG EC tablet  . Blood Glucose Monitoring Suppl (ONETOUCH VERIO) w/Device KIT  . carvedilol (COREG) 6.25 MG tablet  . empagliflozin (JARDIANCE) 10 MG TABS tablet  . glucose blood (ONETOUCH VERIO) test strip  . Insulin Glargine (LANTUS SOLOSTAR) 100 UNIT/ML Solostar Pen  . Lancets MISC  . losartan  (COZAAR) 25 MG tablet  . metFORMIN (GLUCOPHAGE-XR) 500 MG 24 hr tablet  . rosuvastatin (CRESTOR) 10 MG tablet  . spironolactone (ALDACTONE) 25 MG tablet  . tamsulosin (FLOMAX) 0.4 MG CAPS capsule  . TRULICITY 1.5 VZ/8.5YI SOPN  . UNIFINE PENTIPS 32G X 4 MM MISC   No current facility-administered medications for this encounter.    Derrill Memo ON 05/07/2019] bupivacaine liposome (EXPAREL) 1.3 % injection 266 mg    Maia Plan [facility] Pre-Surgical Testing [phone] 05/06/19  10:19 AM

## 2019-05-06 NOTE — Anesthesia Preprocedure Evaluation (Addendum)
Anesthesia Evaluation  Patient identified by MRN, date of birth, ID band Patient awake    Reviewed: Allergy & Precautions, NPO status , Patient's Chart, lab work & pertinent test results  Airway Mallampati: II  TM Distance: >3 FB Neck ROM: Full    Dental  (+) Teeth Intact, Dental Advisory Given   Pulmonary sleep apnea ,    breath sounds clear to auscultation       Cardiovascular hypertension, Pt. on home beta blockers and Pt. on medications + CAD and +CHF  + dysrhythmias  Rhythm:Regular Rate:Normal     Neuro/Psych negative neurological ROS  negative psych ROS   GI/Hepatic Neg liver ROS, GERD  ,  Endo/Other  diabetes, Type 2, Oral Hypoglycemic Agents  Renal/GU Renal InsufficiencyRenal disease     Musculoskeletal  (+) Arthritis ,   Abdominal Normal abdominal exam  (+)   Peds  Hematology negative hematology ROS (+)   Anesthesia Other Findings   Reproductive/Obstetrics                           Echo:  1. The left ventricle has normal systolic function, with an ejection fraction of 55-60%. The cavity size was normal. Left ventricular diastolic Doppler parameters are consistent with impaired relaxation.  2. The right ventricle has normal systolic function. The cavity was normal. There is no increase in right ventricular wall thickness.  3. When compared to the prior study: EF now appears normal when compared to prior (45%). Normal aortic valve.   Anesthesia Physical Anesthesia Plan  ASA: III  Anesthesia Plan: Spinal   Post-op Pain Management:  [facility] for Post-op pain   Induction:   PONV Risk Score and Plan: 2 and Ondansetron, Propofol infusion and Dexamethasone  Airway Management Planned: Natural Airway and Simple Face Mask  Additional Equipment: None  Intra-op Plan:   Post-operative Plan:   Informed Consent: I have reviewed the patients History and Physical, chart, labs and  discussed the procedure including the risks, benefits and alternatives for the proposed anesthesia with the patient or authorized representative who has indicated his/her understanding and acceptance.       Plan Discussed with: CRNA  Anesthesia Plan Comments: (See PAT note 05/05/2019, Konrad Felix, PA-C)      Anesthesia Quick Evaluation

## 2019-05-07 ENCOUNTER — Encounter (HOSPITAL_COMMUNITY)
Admission: RE | Disposition: A | Discharge status: Home Health | Payer: Self-pay | Source: Home / Self Care | Attending: Orthopedic Surgery

## 2019-05-07 ENCOUNTER — Ambulatory Visit (HOSPITAL_COMMUNITY): Payer: BC Managed Care – PPO | Admitting: Physician Assistant

## 2019-05-07 ENCOUNTER — Ambulatory Visit (HOSPITAL_COMMUNITY)
Admission: RE | Admit: 2019-05-07 | Discharge: 2019-05-08 | Disposition: A | Discharge status: Home Health | Payer: BC Managed Care – PPO | Attending: Orthopedic Surgery | Admitting: Orthopedic Surgery

## 2019-05-07 ENCOUNTER — Encounter (HOSPITAL_COMMUNITY): Payer: Self-pay

## 2019-05-07 ENCOUNTER — Ambulatory Visit (HOSPITAL_COMMUNITY): Payer: BC Managed Care – PPO | Admitting: Certified Registered Nurse Anesthetist

## 2019-05-07 ENCOUNTER — Other Ambulatory Visit: Payer: Self-pay

## 2019-05-07 DIAGNOSIS — Z888 Allergy status to other drugs, medicaments and biological substances status: Secondary | ICD-10-CM | POA: Diagnosis not present

## 2019-05-07 DIAGNOSIS — I2510 Atherosclerotic heart disease of native coronary artery without angina pectoris: Secondary | ICD-10-CM | POA: Diagnosis not present

## 2019-05-07 DIAGNOSIS — I130 Hypertensive heart and chronic kidney disease with heart failure and stage 1 through stage 4 chronic kidney disease, or unspecified chronic kidney disease: Secondary | ICD-10-CM | POA: Diagnosis not present

## 2019-05-07 DIAGNOSIS — E1122 Type 2 diabetes mellitus with diabetic chronic kidney disease: Secondary | ICD-10-CM | POA: Insufficient documentation

## 2019-05-07 DIAGNOSIS — G8918 Other acute postprocedural pain: Secondary | ICD-10-CM | POA: Diagnosis not present

## 2019-05-07 DIAGNOSIS — I110 Hypertensive heart disease with heart failure: Secondary | ICD-10-CM | POA: Diagnosis not present

## 2019-05-07 DIAGNOSIS — Z86718 Personal history of other venous thrombosis and embolism: Secondary | ICD-10-CM | POA: Diagnosis not present

## 2019-05-07 DIAGNOSIS — I420 Dilated cardiomyopathy: Secondary | ICD-10-CM | POA: Diagnosis not present

## 2019-05-07 DIAGNOSIS — G4730 Sleep apnea, unspecified: Secondary | ICD-10-CM | POA: Insufficient documentation

## 2019-05-07 DIAGNOSIS — N183 Chronic kidney disease, stage 3 unspecified: Secondary | ICD-10-CM | POA: Insufficient documentation

## 2019-05-07 DIAGNOSIS — M1711 Unilateral primary osteoarthritis, right knee: Secondary | ICD-10-CM | POA: Insufficient documentation

## 2019-05-07 DIAGNOSIS — I5022 Chronic systolic (congestive) heart failure: Secondary | ICD-10-CM | POA: Diagnosis not present

## 2019-05-07 HISTORY — PX: TOTAL KNEE ARTHROPLASTY: SHX125

## 2019-05-07 LAB — TYPE AND SCREEN
ABO/RH(D): A POS
Antibody Screen: POSITIVE
DAT, IgG: NEGATIVE
Unit division: 0
Unit division: 0

## 2019-05-07 LAB — BPAM RBC
Blood Product Expiration Date: 202012152359
Blood Product Expiration Date: 202012152359
Unit Type and Rh: 6200
Unit Type and Rh: 6200

## 2019-05-07 LAB — GLUCOSE, CAPILLARY
Glucose-Capillary: 97 mg/dL (ref 70–99)
Glucose-Capillary: 92 mg/dL (ref 70–99)
Glucose-Capillary: 93 mg/dL (ref 70–99)
Glucose-Capillary: 175 mg/dL — ABNORMAL HIGH (ref 70–99)
Glucose-Capillary: 221 mg/dL — ABNORMAL HIGH (ref 70–99)

## 2019-05-07 LAB — HEMOGLOBIN A1C
Hgb A1c MFr Bld: 7 % — ABNORMAL HIGH (ref 4.8–5.6)
Mean Plasma Glucose: 154.2 mg/dL

## 2019-05-07 SURGERY — ARTHROPLASTY, KNEE, TOTAL
Anesthesia: Spinal | Site: Knee | Laterality: Right

## 2019-05-07 MED ORDER — GABAPENTIN 300 MG PO CAPS
300.0000 mg | ORAL_CAPSULE | Freq: Two times a day (BID) | ORAL | Status: DC
  Administered 2019-05-07 – 2019-05-08 (×2): 300 mg via ORAL
  Filled 2019-05-07 (×2): qty 1

## 2019-05-07 MED ORDER — SODIUM CHLORIDE 0.9 % IV SOLN
INTRAVENOUS | Status: DC
  Administered 2019-05-07 – 2019-05-08 (×2): via INTRAVENOUS

## 2019-05-07 MED ORDER — ASPIRIN EC 325 MG PO TBEC: 325.0000 mg | DELAYED_RELEASE_TABLET | Freq: Two times a day (BID) | ORAL | 0 refills | Status: DC

## 2019-05-07 MED ORDER — ONDANSETRON HCL 4 MG/2ML IJ SOLN
INTRAMUSCULAR | Status: AC
  Filled 2019-05-07: qty 2

## 2019-05-07 MED ORDER — TRANEXAMIC ACID-NACL 1000-0.7 MG/100ML-% IV SOLN
1000.0000 mg | INTRAVENOUS | Status: AC
  Administered 2019-05-07: 1000 mg via INTRAVENOUS
  Filled 2019-05-07: qty 100

## 2019-05-07 MED ORDER — BUPIVACAINE IN DEXTROSE 0.75-8.25 % IT SOLN
INTRATHECAL | Status: DC | PRN
  Administered 2019-05-07: 1.6 mL via INTRATHECAL

## 2019-05-07 MED ORDER — PHENYLEPHRINE 40 MCG/ML (10ML) SYRINGE FOR IV PUSH (FOR BLOOD PRESSURE SUPPORT)
PREFILLED_SYRINGE | INTRAVENOUS | Status: DC | PRN
  Administered 2019-05-07: 120 ug via INTRAVENOUS

## 2019-05-07 MED ORDER — PHENYLEPHRINE 40 MCG/ML (10ML) SYRINGE FOR IV PUSH (FOR BLOOD PRESSURE SUPPORT)
PREFILLED_SYRINGE | INTRAVENOUS | Status: AC
  Filled 2019-05-07: qty 10

## 2019-05-07 MED ORDER — SODIUM CHLORIDE 0.9% FLUSH
INTRAVENOUS | Status: DC | PRN
  Administered 2019-05-07: 50 mL

## 2019-05-07 MED ORDER — ROPIVACAINE HCL 7.5 MG/ML IJ SOLN
INTRAMUSCULAR | Status: DC | PRN
  Administered 2019-05-07: 20 mL via PERINEURAL

## 2019-05-07 MED ORDER — DIPHENHYDRAMINE HCL 12.5 MG/5ML PO ELIX: 12.5000 mg | ORAL_SOLUTION | ORAL | Status: DC | PRN

## 2019-05-07 MED ORDER — TAMSULOSIN HCL 0.4 MG PO CAPS
0.4000 mg | ORAL_CAPSULE | Freq: Every day | ORAL | Status: DC
  Administered 2019-05-07: 0.4 mg via ORAL
  Filled 2019-05-07: qty 1

## 2019-05-07 MED ORDER — INSULIN ASPART 100 UNIT/ML ~~LOC~~ SOLN
0.0000 [IU] | Freq: Three times a day (TID) | SUBCUTANEOUS | Status: DC
  Administered 2019-05-07 – 2019-05-08 (×2): 3 [IU] via SUBCUTANEOUS

## 2019-05-07 MED ORDER — HYDROMORPHONE HCL 1 MG/ML IJ SOLN: 0.2500 mg | INTRAMUSCULAR | Status: DC | PRN

## 2019-05-07 MED ORDER — LOSARTAN POTASSIUM 25 MG PO TABS
25.0000 mg | ORAL_TABLET | Freq: Every day | ORAL | Status: DC
  Filled 2019-05-07: qty 1

## 2019-05-07 MED ORDER — SPIRONOLACTONE 25 MG PO TABS
25.0000 mg | ORAL_TABLET | Freq: Every day | ORAL | Status: DC
  Administered 2019-05-08: 25 mg via ORAL
  Filled 2019-05-07: qty 1

## 2019-05-07 MED ORDER — ACETAMINOPHEN 160 MG/5ML PO SOLN: 325.0000 mg | Freq: Once | ORAL | Status: DC | PRN

## 2019-05-07 MED ORDER — PHENYLEPHRINE HCL (PRESSORS) 10 MG/ML IV SOLN
INTRAVENOUS | Status: AC
  Filled 2019-05-07: qty 1

## 2019-05-07 MED ORDER — BUPIVACAINE-EPINEPHRINE 0.25% -1:200000 IJ SOLN
INTRAMUSCULAR | Status: DC | PRN
  Administered 2019-05-07: 50 mL

## 2019-05-07 MED ORDER — BISACODYL 5 MG PO TBEC: 5.0000 mg | DELAYED_RELEASE_TABLET | Freq: Every day | ORAL | Status: DC | PRN

## 2019-05-07 MED ORDER — PROMETHAZINE HCL 25 MG/ML IJ SOLN: 6.2500 mg | INTRAMUSCULAR | Status: DC | PRN

## 2019-05-07 MED ORDER — TRANEXAMIC ACID-NACL 1000-0.7 MG/100ML-% IV SOLN
1000.0000 mg | Freq: Once | INTRAVENOUS | Status: AC
  Administered 2019-05-07: 14:00:00 1000 mg via INTRAVENOUS
  Filled 2019-05-07: qty 100

## 2019-05-07 MED ORDER — ROSUVASTATIN CALCIUM 10 MG PO TABS
10.0000 mg | ORAL_TABLET | Freq: Every day | ORAL | Status: DC
  Administered 2019-05-08: 10 mg via ORAL
  Filled 2019-05-07: qty 1

## 2019-05-07 MED ORDER — PROPOFOL 10 MG/ML IV BOLUS
INTRAVENOUS | Status: DC | PRN
  Administered 2019-05-07: 20 mg via INTRAVENOUS
  Administered 2019-05-07: 40 mg via INTRAVENOUS

## 2019-05-07 MED ORDER — ONDANSETRON HCL 4 MG/2ML IJ SOLN
INTRAMUSCULAR | Status: DC | PRN
  Administered 2019-05-07: 4 mg via INTRAVENOUS

## 2019-05-07 MED ORDER — CEFAZOLIN SODIUM-DEXTROSE 2-4 GM/100ML-% IV SOLN
2.0000 g | Freq: Four times a day (QID) | INTRAVENOUS | Status: AC
  Administered 2019-05-07 (×2): 2 g via INTRAVENOUS
  Filled 2019-05-07 (×2): qty 100

## 2019-05-07 MED ORDER — 0.9 % SODIUM CHLORIDE (POUR BTL) OPTIME
TOPICAL | Status: DC | PRN
  Administered 2019-05-07: 1000 mL

## 2019-05-07 MED ORDER — BUPIVACAINE-EPINEPHRINE 0.25% -1:200000 IJ SOLN
INTRAMUSCULAR | Status: AC
  Filled 2019-05-07: qty 1

## 2019-05-07 MED ORDER — LIDOCAINE 2% (20 MG/ML) 5 ML SYRINGE
INTRAMUSCULAR | Status: DC | PRN
  Administered 2019-05-07: 60 mg via INTRAVENOUS

## 2019-05-07 MED ORDER — OXYCODONE HCL 5 MG PO TABS
5.0000 mg | ORAL_TABLET | ORAL | Status: DC | PRN
  Administered 2019-05-07 (×2): 5 mg via ORAL
  Administered 2019-05-08 (×2): 10 mg via ORAL
  Administered 2019-05-08 (×2): 5 mg via ORAL
  Filled 2019-05-07 (×2): qty 1
  Filled 2019-05-07: qty 2
  Filled 2019-05-07 (×2): qty 1
  Filled 2019-05-07: qty 2

## 2019-05-07 MED ORDER — ONDANSETRON HCL 4 MG/2ML IJ SOLN: 4.0000 mg | Freq: Four times a day (QID) | INTRAMUSCULAR | Status: DC | PRN

## 2019-05-07 MED ORDER — SODIUM CHLORIDE 0.9 % IR SOLN
Status: DC | PRN
  Administered 2019-05-07: 1000 mL

## 2019-05-07 MED ORDER — BUPIVACAINE LIPOSOME 1.3 % IJ SUSP
INTRAMUSCULAR | Status: DC | PRN
  Administered 2019-05-07: 20 mL

## 2019-05-07 MED ORDER — MAGNESIUM CITRATE PO SOLN: 1.0000 | Freq: Once | ORAL | Status: DC | PRN

## 2019-05-07 MED ORDER — METHOCARBAMOL 500 MG PO TABS
500.0000 mg | ORAL_TABLET | Freq: Four times a day (QID) | ORAL | Status: DC | PRN
  Administered 2019-05-08: 500 mg via ORAL
  Filled 2019-05-07: qty 1

## 2019-05-07 MED ORDER — PROPOFOL 500 MG/50ML IV EMUL
INTRAVENOUS | Status: DC | PRN
  Administered 2019-05-07: 100 ug/kg/min via INTRAVENOUS

## 2019-05-07 MED ORDER — WATER FOR IRRIGATION, STERILE IR SOLN
Status: DC | PRN
  Administered 2019-05-07: 2000 mL

## 2019-05-07 MED ORDER — FENTANYL CITRATE (PF) 100 MCG/2ML IJ SOLN
INTRAMUSCULAR | Status: DC | PRN
  Administered 2019-05-07: 100 ug via INTRAVENOUS

## 2019-05-07 MED ORDER — METHOCARBAMOL 500 MG IVPB - SIMPLE MED
500.0000 mg | Freq: Four times a day (QID) | INTRAVENOUS | Status: DC | PRN
  Filled 2019-05-07: qty 50

## 2019-05-07 MED ORDER — TIZANIDINE HCL 2 MG PO TABS: 2.0000 mg | ORAL_TABLET | Freq: Three times a day (TID) | ORAL | 0 refills | Status: DC | PRN

## 2019-05-07 MED ORDER — DEXAMETHASONE SODIUM PHOSPHATE 10 MG/ML IJ SOLN
INTRAMUSCULAR | Status: AC
  Filled 2019-05-07: qty 1

## 2019-05-07 MED ORDER — ASPIRIN EC 325 MG PO TBEC
325.0000 mg | DELAYED_RELEASE_TABLET | Freq: Two times a day (BID) | ORAL | Status: DC
  Administered 2019-05-07 – 2019-05-08 (×2): 325 mg via ORAL
  Filled 2019-05-07 (×2): qty 1

## 2019-05-07 MED ORDER — MEPERIDINE HCL 50 MG/ML IJ SOLN: 6.2500 mg | INTRAMUSCULAR | Status: DC | PRN

## 2019-05-07 MED ORDER — HYDROMORPHONE HCL 1 MG/ML IJ SOLN: 0.5000 mg | INTRAMUSCULAR | Status: DC | PRN

## 2019-05-07 MED ORDER — ACETAMINOPHEN 10 MG/ML IV SOLN: 1000.0000 mg | Freq: Once | INTRAVENOUS | Status: DC | PRN

## 2019-05-07 MED ORDER — MIDAZOLAM HCL 2 MG/2ML IJ SOLN
INTRAMUSCULAR | Status: AC
  Filled 2019-05-07: qty 2

## 2019-05-07 MED ORDER — ONDANSETRON HCL 4 MG PO TABS: 4.0000 mg | ORAL_TABLET | Freq: Four times a day (QID) | ORAL | Status: DC | PRN

## 2019-05-07 MED ORDER — POVIDONE-IODINE 10 % EX SWAB
2.0000 "application " | Freq: Once | CUTANEOUS | Status: AC
  Administered 2019-05-07: 2 via TOPICAL

## 2019-05-07 MED ORDER — POLYETHYLENE GLYCOL 3350 17 G PO PACK: 17.0000 g | PACK | Freq: Every day | ORAL | Status: DC | PRN

## 2019-05-07 MED ORDER — MIDAZOLAM HCL 5 MG/5ML IJ SOLN
INTRAMUSCULAR | Status: DC | PRN
  Administered 2019-05-07: 2 mg via INTRAVENOUS

## 2019-05-07 MED ORDER — METFORMIN HCL ER 500 MG PO TB24
2000.0000 mg | ORAL_TABLET | Freq: Every day | ORAL | Status: DC
  Administered 2019-05-07: 2000 mg via ORAL
  Filled 2019-05-07: qty 4

## 2019-05-07 MED ORDER — SODIUM CHLORIDE (PF) 0.9 % IJ SOLN
INTRAMUSCULAR | Status: AC
  Filled 2019-05-07: qty 50

## 2019-05-07 MED ORDER — FENTANYL CITRATE (PF) 100 MCG/2ML IJ SOLN
INTRAMUSCULAR | Status: AC
  Filled 2019-05-07: qty 2

## 2019-05-07 MED ORDER — LACTATED RINGERS IV SOLN: INTRAVENOUS | Status: DC

## 2019-05-07 MED ORDER — CANAGLIFLOZIN 100 MG PO TABS
100.0000 mg | ORAL_TABLET | Freq: Every day | ORAL | Status: DC
  Administered 2019-05-08: 100 mg via ORAL
  Filled 2019-05-07: qty 1

## 2019-05-07 MED ORDER — ACETAMINOPHEN 325 MG PO TABS: 325.0000 mg | ORAL_TABLET | Freq: Once | ORAL | Status: DC | PRN

## 2019-05-07 MED ORDER — ACETAMINOPHEN 325 MG PO TABS
325.0000 mg | ORAL_TABLET | Freq: Four times a day (QID) | ORAL | Status: DC | PRN
  Administered 2019-05-08: 650 mg via ORAL
  Filled 2019-05-07: qty 2

## 2019-05-07 MED ORDER — LACTATED RINGERS IV SOLN
INTRAVENOUS | Status: DC
  Administered 2019-05-07 (×2): via INTRAVENOUS

## 2019-05-07 MED ORDER — PHENYLEPHRINE HCL-NACL 10-0.9 MG/250ML-% IV SOLN
INTRAVENOUS | Status: DC | PRN
  Administered 2019-05-07: 40 ug/min via INTRAVENOUS

## 2019-05-07 MED ORDER — INSULIN GLARGINE 100 UNIT/ML ~~LOC~~ SOLN
20.0000 [IU] | Freq: Every day | SUBCUTANEOUS | Status: DC
  Administered 2019-05-07: 20 [IU] via SUBCUTANEOUS
  Filled 2019-05-07 (×2): qty 0.2

## 2019-05-07 MED ORDER — CELECOXIB 200 MG PO CAPS
200.0000 mg | ORAL_CAPSULE | Freq: Two times a day (BID) | ORAL | Status: DC
  Administered 2019-05-07 – 2019-05-08 (×2): 200 mg via ORAL
  Filled 2019-05-07 (×2): qty 1

## 2019-05-07 MED ORDER — DOCUSATE SODIUM 100 MG PO CAPS
100.0000 mg | ORAL_CAPSULE | Freq: Two times a day (BID) | ORAL | Status: DC
  Administered 2019-05-07 – 2019-05-08 (×2): 100 mg via ORAL
  Filled 2019-05-07 (×2): qty 1

## 2019-05-07 MED ORDER — CARVEDILOL 6.25 MG PO TABS
6.2500 mg | ORAL_TABLET | Freq: Two times a day (BID) | ORAL | Status: DC
  Administered 2019-05-07 – 2019-05-08 (×2): 6.25 mg via ORAL
  Filled 2019-05-07 (×2): qty 1

## 2019-05-07 MED ORDER — PROPOFOL 10 MG/ML IV BOLUS
INTRAVENOUS | Status: AC
  Filled 2019-05-07: qty 20

## 2019-05-07 MED ORDER — CEFAZOLIN SODIUM-DEXTROSE 2-4 GM/100ML-% IV SOLN
2.0000 g | INTRAVENOUS | Status: AC
  Administered 2019-05-07: 08:00:00 2 g via INTRAVENOUS
  Filled 2019-05-07: qty 100

## 2019-05-07 MED ORDER — OXYCODONE-ACETAMINOPHEN 5-325 MG PO TABS: 1.0000 | ORAL_TABLET | Freq: Four times a day (QID) | ORAL | 0 refills | Status: DC | PRN

## 2019-05-07 MED ORDER — DEXAMETHASONE SODIUM PHOSPHATE 10 MG/ML IJ SOLN
10.0000 mg | Freq: Two times a day (BID) | INTRAMUSCULAR | Status: DC
  Administered 2019-05-08: 10 mg via INTRAVENOUS
  Filled 2019-05-07: qty 1

## 2019-05-07 MED ORDER — DOCUSATE SODIUM 100 MG PO CAPS: 100.0000 mg | ORAL_CAPSULE | Freq: Two times a day (BID) | ORAL | 0 refills | Status: DC

## 2019-05-07 MED ORDER — DEXAMETHASONE SODIUM PHOSPHATE 10 MG/ML IJ SOLN
INTRAMUSCULAR | Status: DC | PRN
  Administered 2019-05-07: 5 mg via INTRAVENOUS

## 2019-05-07 MED ORDER — ALUM & MAG HYDROXIDE-SIMETH 200-200-20 MG/5ML PO SUSP: 30.0000 mL | ORAL | Status: DC | PRN

## 2019-05-07 MED FILL — SPIRONOLACTONE 25 MG TABS: 25 | 30 days supply | Qty: 30 | Fill #0

## 2019-05-07 MED FILL — CARVEDILOL 6.25 MG TABLET: 6.25 | 30 days supply | Qty: 90 | Fill #0

## 2019-05-07 MED FILL — OXYCODONE-ACETAMINOPHEN 5-3: 5-325 | 6 days supply | Qty: 50 | Fill #0

## 2019-05-07 MED FILL — tiZANidine HCL 2 MG TABS: 2 | 13 days supply | Qty: 40 | Fill #0

## 2019-05-07 SURGICAL SUPPLY — 58 items
ATTUNE MED DOME PAT 41 KNEE (Knees) ×1 IMPLANT
ATTUNE MED DOME PAT 41MM KNEE (Knees) ×1 IMPLANT
ATTUNE PS FEM RT SZ 7 CEM KNEE (Femur) ×2 IMPLANT
ATTUNE PSRP INSR SZ7 10 KNEE (Insert) ×1 IMPLANT
ATTUNE PSRP INSR SZ7 10MM KNEE (Insert) ×1 IMPLANT
BAG ZIPLOCK 12X15 (MISCELLANEOUS) ×3 IMPLANT
BASE TIBIAL ROT PLAT SZ 8 KNEE (Knees) IMPLANT
BENZOIN TINCTURE PRP APPL 2/3 (GAUZE/BANDAGES/DRESSINGS) ×3 IMPLANT
BLADE SAGITTAL 25.0X1.19X90 (BLADE) ×2 IMPLANT
BLADE SAGITTAL 25.0X1.19X90MM (BLADE) ×1
BLADE SAW SGTL 13.0X1.19X90.0M (BLADE) ×3 IMPLANT
BLADE SURG SZ10 CARB STEEL (BLADE) ×6 IMPLANT
BNDG ELASTIC 6X5.8 VLCR STR LF (GAUZE/BANDAGES/DRESSINGS) ×3 IMPLANT
BOOTIES KNEE HIGH SLOAN (MISCELLANEOUS) ×3 IMPLANT
BOWL SMART MIX CTS (DISPOSABLE) ×3 IMPLANT
CEMENT HV SMART SET (Cement) ×4 IMPLANT
CLOSURE WOUND 1/2 X4 (GAUZE/BANDAGES/DRESSINGS) ×2
COVER SURGICAL LIGHT HANDLE (MISCELLANEOUS) ×3 IMPLANT
COVER WAND RF STERILE (DRAPES) ×2 IMPLANT
CUFF TOURN SGL QUICK 34 (TOURNIQUET CUFF) ×2
CUFF TRNQT CYL 34X4.125X (TOURNIQUET CUFF) ×1 IMPLANT
DECANTER SPIKE VIAL GLASS SM (MISCELLANEOUS) ×6 IMPLANT
DRAPE U-SHAPE 47X51 STRL (DRAPES) ×3 IMPLANT
DRSG AQUACEL AG ADV 3.5X10 (GAUZE/BANDAGES/DRESSINGS) ×3 IMPLANT
DURAPREP 26ML APPLICATOR (WOUND CARE) ×3 IMPLANT
ELECT REM PT RETURN 15FT ADLT (MISCELLANEOUS) ×3 IMPLANT
GLOVE BIOGEL PI IND STRL 8 (GLOVE) ×2 IMPLANT
GLOVE BIOGEL PI INDICATOR 8 (GLOVE) ×4
GLOVE ECLIPSE 7.5 STRL STRAW (GLOVE) ×6 IMPLANT
GOWN STRL REUS W/TWL XL LVL3 (GOWN DISPOSABLE) ×6 IMPLANT
HANDPIECE INTERPULSE COAX TIP (DISPOSABLE) ×2
HOLDER FOLEY CATH W/STRAP (MISCELLANEOUS) ×2 IMPLANT
HOOD PEEL AWAY FLYTE STAYCOOL (MISCELLANEOUS) ×9 IMPLANT
KIT TURNOVER KIT A (KITS) IMPLANT
MANIFOLD NEPTUNE II (INSTRUMENTS) ×3 IMPLANT
NEEDLE HYPO 22GX1.5 SAFETY (NEEDLE) ×3 IMPLANT
NS IRRIG 1000ML POUR BTL (IV SOLUTION) ×3 IMPLANT
PACK ICE MAXI GEL EZY WRAP (MISCELLANEOUS) ×3 IMPLANT
PACK TOTAL KNEE CUSTOM (KITS) ×3 IMPLANT
PADDING CAST COTTON 6X4 STRL (CAST SUPPLIES) ×3 IMPLANT
PENCIL SMOKE EVACUATOR (MISCELLANEOUS) ×2 IMPLANT
PIN DRILL FIX HALF THREAD (BIT) ×2 IMPLANT
PIN STEINMAN FIXATION KNEE (PIN) ×2 IMPLANT
PROTECTOR NERVE ULNAR (MISCELLANEOUS) ×3 IMPLANT
SET HNDPC FAN SPRY TIP SCT (DISPOSABLE) ×1 IMPLANT
STRIP CLOSURE SKIN 1/2X4 (GAUZE/BANDAGES/DRESSINGS) ×2 IMPLANT
SUT MNCRL AB 3-0 PS2 18 (SUTURE) ×3 IMPLANT
SUT VIC AB 0 CT1 36 (SUTURE) ×5 IMPLANT
SUT VIC AB 1 CT1 36 (SUTURE) ×6 IMPLANT
SUT VIC AB 2-0 CT1 27 (SUTURE) ×2
SUT VIC AB 2-0 CT1 TAPERPNT 27 (SUTURE) IMPLANT
SYR CONTROL 10ML LL (SYRINGE) ×6 IMPLANT
TIBIAL BASE ROT PLAT SZ 8 KNEE (Knees) ×3 IMPLANT
TRAY FOLEY MTR SLVR 14FR STAT (SET/KITS/TRAYS/PACK) ×2 IMPLANT
TRAY FOLEY MTR SLVR 16FR STAT (SET/KITS/TRAYS/PACK) ×1 IMPLANT
WATER STERILE IRR 1000ML POUR (IV SOLUTION) ×6 IMPLANT
WRAP KNEE MAXI GEL POST OP (GAUZE/BANDAGES/DRESSINGS) ×2 IMPLANT
YANKAUER SUCT BULB TIP 10FT TU (MISCELLANEOUS) ×3 IMPLANT

## 2019-05-07 NOTE — Evaluation (Signed)
Physical Therapy Evaluation Patient Details Name: Paul [name] MRN: [id number] DOB: 24-Jan-1946 Today's Date: 05/07/2019   History of Present Illness  Patient is 73 y.o. male s/p Rt TKA on 05/07/19 with PMH significant for CAD, CHF, nonischemic cardiomyopathy, AICD ([employer], implanted 05/09/10), HTN, DM, nonsustained ventricular tachycardia, OSA, GERD, and past Lt TKA in 2018.    Clinical Impression  Paul [name] is a 73 y.o. male POD 0 s/p Rt TKA. Patient reports independence with mobility at baseline. Patient is now limited by functional impairments (see PT problem list below) and requires min assist/guard for transfers and gait with RW. Patient was able to ambulate ~80 feet with RW and min assist assist to steady intermittently. Patient instructed in exercise to facilitate ROM and circulation. Patient will benefit from continued skilled PT interventions to address impairments and progress towards PLOF. Acute PT will follow to progress mobility and stair training in preparation for safe discharge home.    Follow Up Recommendations Follow surgeon's recommendation for DC plan and follow-up therapies    Equipment Recommendations  None recommended by PT    Recommendations for Other Services       Precautions / Restrictions Precautions Precautions: Fall Restrictions Weight Bearing Restrictions: No      Mobility  Bed Mobility Overal bed mobility: Needs Assistance Bed Mobility: Supine to Sit     Supine to sit: Supervision;HOB elevated     General bed mobility comments: no cues requried or assistacne to sit up to EOB, pt using bed rails  Transfers Overall transfer level: Needs assistance Equipment used: Rolling walker (2 wheeled) Transfers: Sit to/from Stand Sit to Stand: Min guard;Min assist;From elevated surface         General transfer comment: ceus for safe hand placement and technique with RW, light min assist requried to initaite power up and steady with  rise  Ambulation/Gait Ambulation/Gait assistance: Min guard;Min assist Gait Distance (Feet): 80 Feet Assistive device: Rolling walker (2 wheeled) Gait Pattern/deviations: Step-through pattern;Decreased stride length;Decreased stance time - right Gait velocity: slightly decreased   General Gait Details: Pt demonstrated safe management of RW and proximity to walker throughout, good use of UE's to support durign Rt stance phase, 1 episode of slight Rt knee buckling requiring min assist to steady and pt able to support with UE's to correct  Stairs            Wheelchair Mobility    Modified Rankin (Stroke Patients Only)       Balance Overall balance assessment: Needs assistance Sitting-balance support: No upper extremity supported;Feet supported Sitting balance-Leahy Scale: Good     Standing balance support: During functional activity;Bilateral upper extremity supported Standing balance-Leahy Scale: Fair            Pertinent Vitals/Pain Pain Assessment: No/denies pain    Home Living Family/patient expects to be discharged to:: Private residence Living Arrangements: Spouse/significant other Available Help at Discharge: Family;Available 24 hours/day Type of Home: House Home Access: Stairs to enter Entrance Stairs-Rails: None Entrance Stairs-Number of Steps: 1 step through garage no rails Home Layout: Two level;Able to live on main level with bedroom/bathroom Home Equipment: Grab bars - tub/shower;Shower seat - built in;Walker - 2 wheels;Cane - single point      Prior Function Level of Independence: Independent         Comments: pt works full time as a Engineer, 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[place]: Right    Extremity/Trunk Assessment   Upper  Extremity Assessment Upper Extremity Assessment: Overall WFL for tasks assessed    Lower Extremity Assessment Lower Extremity Assessment: Overall WFL for tasks assessed;RLE  deficits/detail RLE Deficits / Details: pt reports tingling in bil LE and states he has decreased proprioception however is able to sequence step patterns smoothly and can sequence SLR and LAQ without difficulty; light touch grossly intact, no extensor lag with SLR, 4/5 for MMT of quad RLE: Unable to fully assess due to pain RLE Sensation: WNL RLE Coordination: WNL    Cervical / Trunk Assessment Cervical / Trunk Assessment: Normal  Communication   Communication: No difficulties  Cognition Arousal/Alertness: Awake/alert Behavior During Therapy: WFL for tasks assessed/performed Overall Cognitive Status: Within Functional Limits for tasks assessed           General Comments      Exercises Total Joint Exercises Ankle Circles/Pumps: AAROM;20 reps;Seated;Both Quad Sets: AROM;Supine;10 reps;Right Heel Slides: AAROM;10 reps;Seated;Right   Assessment/Plan    PT Assessment Patient needs continued PT services  PT Problem List Decreased strength;Decreased balance;Decreased mobility;Decreased range of motion;Decreased activity tolerance;Decreased knowledge of use of DME       PT Treatment Interventions DME instruction;Functional mobility training;Therapeutic activities;Gait training;Stair training;Therapeutic exercise;Balance training;Patient/family education    PT Goals (Current goals can be found in the Care Plan section)  Acute Rehab PT Goals Patient Stated Goal: return home and get back to indpendence and work PT Goal Formulation: With patient Time For Goal Achievement: 05/14/19 Potential to Achieve Goals: Good    Frequency 7X/week    AM-PAC PT "6 Clicks" Mobility  Outcome Measure Help needed turning from your back to your side while in a flat bed without using bedrails?: A Little Help needed moving from lying on your back to sitting on the side of a flat bed without using bedrails?: A Little Help needed moving to and from a bed to a chair (including a wheelchair)?: A  Little Help needed standing up from a chair using your arms (e.g., wheelchair or bedside chair)?: A Little Help needed to walk in hospital room?: A Little Help needed climbing 3-5 steps with a railing? : A Little 6 Click Score: 18    End of Session Equipment Utilized During Treatment: Gait belt Activity Tolerance: Patient tolerated treatment well Patient left: with call bell/phone within reach;in chair;with family/visitor present;with chair alarm set Nurse Communication: Mobility status PT Visit Diagnosis: Muscle weakness (generalized) (M62.81);Difficulty in walking, not elsewhere classified (R26.2)    Time: [id number] PT Time Calculation (min) (ACUTE ONLY): 27 min   Charges:   PT Evaluation $PT Eval Low Complexity: 1 Low PT Treatments $Therapeutic Exercise: 8-22 mins        Kipp Brood, PT, DPT Physical Therapist with [facility]  05/07/2019 2:54 PM

## 2019-05-07 NOTE — Op Note (Addendum)
PATIENT ID:      Paul [name]  MRN:     [id number] DOB/AGE:    01-08-1946 / 73 y.o.       OPERATIVE REPORT   DATE OF PROCEDURE:  05/07/2019      PREOPERATIVE DIAGNOSIS:   OSTEOARTHRITIS RIGHT KNEE      Estimated body mass index is 30.82 kg/m as calculated from the following:   Height as of 05/05/19: 5\' 11"  (1.803 m).   Weight as of 05/05/19: 100.2 kg.                                                       POSTOPERATIVE DIAGNOSIS:   OSTEOARTHRITIS RIGHT KNEE                                                                       PROCEDURE:  Procedure(s): TOTAL KNEE ARTHROPLASTY Using DepuyAttune RP implants #7 Femur, #8Tibia, 10 mm Attune RP bearing, 41 Patella    SURGEON: Alta Corning  ASSISTANT: Jim bethune PA-C   (Present and scrubbed throughout the case, critical for assistance with exposure, retraction, instrumentation, and closure.)        ANESTHESIA: spinal, 20cc Exparel, 50cc 0.25% Marcaine EBL: minimal cc FLUID REPLACEMENT: unk cc crystaloid TOURNIQUET: DRAINS: None TRANEXAMIC ACID: 1gm IV, 2gm topical COMPLICATIONS:  None         INDICATIONS FOR PROCEDURE: The patient has  OSTEOARTHRITIS RIGHT KNEE, mild varus deformities, XR shows bone on bone arthritis, lateral subluxation of tibia. Patient has failed all conservative measures including anti-inflammatory medicines, narcotics, attempts at exercise and weight loss, cortisone injections and viscosupplementation.  Risks and benefits of surgery have been discussed, questions answered.   DESCRIPTION OF PROCEDURE: The patient identified by armband, received  IV antibiotics, in the holding area at [facility]. Patient taken to the operating room, appropriate anesthetic monitors were attached, and spinal anesthesia was  induced. IV Tranexamic acid was given.Tourniquet applied high to the operative thigh. Lateral post and foot positioner applied to the table, the lower extremity was then prepped and draped in usual sterile  fashion from the toes to the tourniquet. Time-out procedure was performed. The skin and subcutaneous tissue along the incision was injected with 20 cc of a mixture of Exparel and Marcaine solution, using a 20-gauge by 1-1/2 inch needle. We began the operation, with the knee flexed 130 degrees, by making the anterior midline incision starting at handbreadth above the patella going over the patella 1 cm medial to and 4 cm distal to the tibial tubercle. Small bleeders in the skin and the subcutaneous tissue identified and cauterized. Transverse retinaculum was incised and reflected medially and a medial parapatellar arthrotomy was accomplished. the patella was everted and theprepatellar fat pad resected. The superficial medial collateral ligament was then elevated from anterior to posterior along the proximal flare of the tibia and anterior half of the menisci resected. The knee was hyperflexed exposing bone on bone arthritis. Peripheral and notch osteophytes as well as the cruciate ligaments were then resected. We continued to work our way  around posteriorly along the proximal tibia, and externally rotated the tibia subluxing it out from underneath the femur. A McHale PCL retractor was placed through the notch and a lateral Hohmann retractor placed, and we then entered the proximal tibia in line with the Depuy starter drill in line with the axis of the tibia followed by an intramedullary guide rod and 0-degree posterior slope cutting guide. The tibial cutting guide, 4 degree posterior sloped, was pinned into place allowing resection of 2 mm of bone medially and 10 mm of bone laterally. Satisfied with the tibial resection, we then entered the distal femur 2 mm anterior to the PCL origin with the intramedullary guide rod and applied the distal femoral cutting guide set at 9 mm, with 5 degrees of valgus. This was pinned along the epicondylar axis. At this point, the distal femoral cut was accomplished without difficulty.  We then sized for a #7 femoral component and pinned the guide in 3 degrees of external rotation. The chamfer cutting guide was pinned into place. The anterior, posterior, and chamfer cuts were accomplished without difficulty followed by the Attune RP box cutting guide and the box cut. We also removed posterior osteophytes from the posterior femoral condyles. The posterior capsule was injected with Exparel solution. The knee was brought into full extension. We checked our extension gap and fit a 10 mm bearing. Distracting in extension with a lamina spreader,  bleeders in the posterior capsule, Posterior medial and posterior lateral gutter were cauterized.  The transexamic acid-soaked sponge was then placed in the gap of the knee in extension. The knee was flexed 30. The posterior patella cut was accomplished with the 9.5 mm Attune cutting guide, sized for a 40mm dome, and the fixation pegs drilled.The knee was then once again hyperflexed exposing the proximal tibia. We sized for a # 8 tibial base plate, applied the smokestack and the conical reamer followed by the the Delta fin keel punch. We then hammered into place the Attune RP trial femoral component, drilled the lugs, inserted a  10 mm trial bearing, trial patellar button, and took the knee through range of motion from 0-130 degrees. Medial and lateral ligamentous stability was checked. No thumb pressure was required for patellar Tracking. The tourniquet was approx 50 min. All trial components were removed, mating surfaces irrigated with pulse lavage, and dried with suction and sponges. 10 cc of the Exparel solution was applied to the cancellus bone of the patella distal femur and proximal tibia.  After waiting 30 seconds, the bony surfaces were again, dried with sponges. A double batch of DePuy HV cement was mixed and applied to all bony metallic mating surfaces except for the posterior condyles of the femur itself. In order, we hammered into place the tibial  tray and removed excess cement, the femoral component and removed excess cement. The final Attune RP bearing was inserted, and the knee brought to full extension with compression. The patellar button was clamped into place, and excess cement removed. The knee was held at 30 flexion with compression, while the cement cured. The wound was irrigated out with normal saline solution pulse lavage. The rest of the Exparel was injected into the parapatellar arthrotomy, subcutaneous tissues, and periosteal tissues. The parapatellar arthrotomy was closed with running #1 Vicryl suture. The subcutaneous tissue with 0 and 2-0 undyed Vicryl suture, and the skin with running 3-0 SQ vicryl. An Aquacil and Ace wrap were applied. The patient was taken to recovery room without difficulty.  Alta Corning 05/07/2019, 9:08 AM

## 2019-05-07 NOTE — Anesthesia Procedure Notes (Addendum)
Anesthesia Regional Block: Adductor canal block   Pre-Anesthetic Checklist: ,, timeout performed, Correct Patient, Correct Site, Correct Laterality, Correct Procedure, Correct Position, site marked, Risks and benefits discussed,  Surgical consent,  Pre-op evaluation,  At surgeon's request and post-op pain management  Laterality: Right  Prep: chloraprep       Needles:  Injection technique: Single-shot  Needle Type: Echogenic Stimulator Needle     Needle Length: 9cm  Needle Gauge: 21     Additional Needles:   Procedures:,,,, ultrasound used (permanent image in chart),,,,  Narrative:  Start time: 05/07/2019 7:10 AM End time: 05/07/2019 7:15 AM Injection made incrementally with aspirations every 5 mL.  Performed by: Personally  Anesthesiologist: Effie Berkshire, MD  Additional Notes: Patient tolerated the procedure well. Local anesthetic introduced in an incremental fashion under minimal resistance after negative aspirations. No paresthesias were elicited. After completion of the procedure, no acute issues were identified and patient continued to be monitored by RN.

## 2019-05-07 NOTE — Discharge Instructions (Signed)

## 2019-05-07 NOTE — Anesthesia Postprocedure Evaluation (Signed)
Anesthesia Post Note  Patient: JANSSEN PULS  Procedure(s) Performed: TOTAL KNEE ARTHROPLASTY (Right Knee)     Patient location during evaluation: PACU Anesthesia Type: Spinal Level of consciousness: oriented and awake and alert Pain management: pain level controlled Vital Signs Assessment: post-procedure vital signs reviewed and stable Respiratory status: spontaneous breathing, respiratory function stable and patient connected to nasal cannula oxygen Cardiovascular status: blood pressure returned to baseline and stable Postop Assessment: no headache, no backache, no apparent nausea or vomiting and spinal receding Anesthetic complications: no    Last Vitals:  Vitals:   05/07/19 1045 05/07/19 1145  BP: 139/77 (!) 148/77  Pulse: (!) 50 60  Resp: 12 11  Temp:    SpO2: 100% 100%    Last Pain:  Vitals:   05/07/19 1145  TempSrc:   PainSc: 0-No pain                 Effie Berkshire

## 2019-05-07 NOTE — Transfer of Care (Signed)
Immediate Anesthesia Transfer of Care Note  Patient: Paul [name]  Procedure(s) Performed: TOTAL KNEE ARTHROPLASTY (Right Knee)  Patient Location: PACU  Anesthesia Type:Spinal  Level of Consciousness: awake, alert , oriented and patient cooperative  Airway & Oxygen Therapy: Patient Spontanous Breathing and Patient connected to face mask  Post-op Assessment: Report given to RN and Post -op Vital signs reviewed and stable  Post vital signs: Reviewed and stable  Last Vitals:  Vitals Value Taken Time  BP 108/73 05/07/19 0926  Temp    Pulse 55 05/07/19 0929  Resp 11 05/07/19 0929  SpO2 100 % 05/07/19 0929  Vitals shown include unvalidated device data.  Last Pain:  Vitals:   05/07/19 0625  TempSrc: Oral         Complications: No apparent anesthesia complications

## 2019-05-07 NOTE — H&P (Signed)
TOTAL KNEE ADMISSION H&P  Patient is being admitted for right total knee arthroplasty.  Subjective:  Chief Complaint:right knee pain.  HPI: Paul [name], 73 y.o. male, has a history of pain and functional disability in the right knee due to arthritis and has failed non-surgical conservative treatments for greater than 12 weeks to includeNSAID's and/or analgesics, corticosteriod injections, viscosupplementation injections, use of assistive devices and activity modification.  Onset of symptoms was gradual, starting 4 years ago with gradually worsening course since that time. The patient noted no past surgery on the right knee(s).  Patient currently rates pain in the right knee(s) at 9 out of 10 with activity. Patient has night pain, worsening of pain with activity and weight bearing, pain that interferes with activities of daily living, pain with passive range of motion and joint swelling.  Patient has evidence of subchondral sclerosis, periarticular osteophytes and joint space narrowing by imaging studies. This patient has had proximal tibial fracture. There is no active infection.  Patient Active Problem List   Diagnosis Date Noted  . Aftercare following removal or replacement of defibrillator 03/20/2019  . 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 Chronic systolic CHF (congestive heart failure) ([place]) 05/04/2014  . PVC's (premature ventricular contractions) 06/01/2013  . Essential hypertension 05/17/2013  . Cardiomyopathy, dilated, nonischemic ([place]) 10/20/2012  . Ventricular tachycardia/frequent PVCs 10/20/2012  . Poorly controlled type 2 diabetes mellitus with peripheral neuropathy ([place]) 10/20/2012  . Hypogonadism male 10/20/2012   Past Medical History:  Diagnosis Date  . Aftercare following removal/replacement defibrillator    a. defibrillator removed 03/2019.  Marland Kitchen Arthritis    "thumbs, neck, knees" (06/01/2013)  . Cardiomyopathy, dilated,  nonischemic ([place]) 10/20/2012  . Cataract   . CHF (congestive heart failure) ([place])   . Chronic systolic heart failure ([place])   . CKD (chronic kidney disease), stage III   . Coronary artery disease   . DVT (deep vein thrombosis) in pregnancy   . 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  . Erectile dysfunction   . Frequent PVCs   . GERD (gastroesophageal reflux disease)   . Hearing aid worn    B/L  . Hypertension   . Hypogonadism male   . Hypovitaminosis D   . 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 NICM (nonischemic cardiomyopathy) ([place])   . Sleep apnea    cpap  . Type II diabetes mellitus ([place])   . Ventricular tachycardia ([place])    observed during stress testing, subsequent VT on EPS  . Wears glasses     Past Surgical History:  Procedure Laterality Date  . ABLATION  06-01-2013   PVC's ablated along basal inferoseptal RV by Dr Rayann Heman  . APPENDECTOMY  1969  . CARDIAC CATHETERIZATION  04/2010  . CARDIAC DEFIBRILLATOR PLACEMENT  05/09/2010   MDT ICD implanted in [place] by Dr Ky Barban  . CATARACT EXTRACTION W/ INTRAOCULAR LENS  IMPLANT, BILATERAL Bilateral   . ICD LEAD REMOVAL N/A 03/19/2019   Procedure: ICD LEAD AND GENERATOR REMOVAL;  Surgeon: Evans Lance, MD;  Location: [facility];  Service: Cardiovascular;  Laterality: N/A;  . KNEE ARTHROSCOPY Left 1984  . LUMBAR LAMINECTOMY  ~ 2004  . TONSILLECTOMY AND ADENOIDECTOMY  1972  . TOTAL KNEE ARTHROPLASTY Left 04/28/2017  . TOTAL KNEE ARTHROPLASTY Left 04/28/2017   Procedure: TOTAL KNEE ARTHROPLASTY LEFT;  Surgeon: Dorna Leitz, MD;  Location: [facility];  Service: Orthopedics;  Laterality: Left;  .  V-TACH ABLATION N/A 06/01/2013   Procedure: V-TACH ABLATION;  Surgeon: Coralyn Mark, MD;  Location: [facility];  Service: Cardiovascular;  Laterality: N/A;  . VASECTOMY    . VENTRICULAR ABLATION SURGERY  06/01/2013    Current Facility-Administered Medications  Medication Dose  Route Frequency Provider Last Rate Last Dose  . bupivacaine liposome (EXPAREL) 1.3 % injection 266 mg  20 mL Other On Call to OR Dorna Leitz, MD      . ceFAZolin (ANCEF) IVPB 2g/100 mL premix  2 g Intravenous On Call to OR Dorna Leitz, MD      . lactated ringers infusion   Intravenous Continuous Effie Berkshire, MD 50 mL/hr at 05/07/19 [phone]    . tranexamic acid (CYKLOKAPRON) IVPB 1,000 mg  1,000 mg Intravenous To OR Dorna Leitz, MD       Allergies  Allergen Reactions  . Lisinopril Swelling    angioedema    Social History   Tobacco Use  . Smoking status: Never Smoker  . Smokeless tobacco: Never Used  Substance Use Topics  . Alcohol use: Yes    Alcohol/week: 2.0 standard drinks    Types: 1 Glasses of wine, 1 Cans of beer per week    Comment: social    Family History  Problem Relation Age of Onset  . Alcohol abuse Mother   . COPD Mother   . Depression Mother   . Heart disease Mother   . Hypertension Mother   . Stroke Mother   . COPD Father   . Heart disease Father   . Hypertension Father   . Stroke Father   . Cancer Father   . Diabetes Sister   . Mental retardation Sister   . Learning disabilities Sister   . Hypertension Sister   . Alcohol abuse Brother   . Drug abuse Brother   . Heart disease Brother   . Hypertension Brother   . Coronary artery disease Brother      ROS ROS: I have reviewed the patient's review of systems thoroughly and there are no positive responses as relates to the HPI. Objective:  Physical Exam  Vital signs in last 24 hours: Temp:  [98.2 F (36.8 C)] 98.2 F (36.8 C) (11/20 0625) Pulse Rate:  [64] 64 (11/20 0625) Resp:  [18] 18 (11/20 0625) BP: (128)/(81) 128/81 (11/20 0625) SpO2:  [99 %] 99 % (11/20 0625) Well-developed well-nourished patient in no acute distress. Alert and oriented x3 HEENT:within normal limits Cardiac: Regular rate and rhythm Pulmonary: Lungs clear to auscultation Abdomen: Soft and nontender.  Normal active  bowel sounds  Musculoskeletal: Right knee: Painful range of motion.  Limited range of motion.  Trace effusion.  No instability.  Mild varus malalignment. Labs: Recent Results (from the past 2160 hour(s))  Basic Metabolic Panel (BMET)     Status: Abnormal   Collection Time: 02/08/19  9:38 AM  Result Value Ref Range   Glucose 227 (H) 65 - 99 mg/dL   BUN 33 (H) 8 - 27 mg/dL   Creatinine, Ser 1.38 (H) 0.76 - 1.27 mg/dL   GFR calc non Af Amer 51 (L) >59 mL/min/1.73   GFR calc Af Amer 59 (L) >59 mL/min/1.73   BUN/Creatinine Ratio 24 10 - 24   Sodium 138 134 - 144 mmol/L   Potassium 4.9 3.5 - 5.2 mmol/L   Chloride 99 96 - 106 mmol/L   CO2 23 20 - 29 mmol/L   Calcium 9.1 8.6 - 10.2 mg/dL  CBC  Status: None   Collection Time: 02/08/19  9:38 AM  Result Value Ref Range   WBC 7.5 3.4 - 10.8 x10E3/uL   RBC 4.57 4.14 - 5.80 x10E6/uL   Hemoglobin 13.7 13.0 - 17.7 g/dL   Hematocrit 41.5 37.5 - 51.0 %   MCV 91 79 - 97 fL   MCH 30.0 26.6 - 33.0 pg   MCHC 33.0 31.5 - 35.7 g/dL   RDW 12.8 11.6 - 15.4 %   Platelets 242 150 - 450 x10E3/uL  CUP PACEART INCLINIC DEVICE CHECK     Status: None   Collection Time: 02/08/19  1:01 PM  Result Value Ref Range   Date Time Interrogation Session [id number]    Pulse Generator Manufacturer MERM    Pulse Gen Model [id number] Protecta XT DR    Pulse Gen Serial Number [id number]    Clinic Name [facility]    Implantable Pulse Generator Type Implantable Cardiac Defibulator    Implantable Pulse Generator Implant Date XL:[id number]    Implantable Lead Manufacturer MERM    Implantable Lead Model 5076 CapSureFix Novus    Implantable Lead Serial Number [id number]    Implantable Lead Implant Date XL:7113325    Implantable Lead Location Detail 1 APPENDAGE    Implantable Lead Location [id number]    Implantable Lead Manufacturer [facility]    Implantable Lead Model [id number] Sprint Quattro Secure    Implantable Lead Serial Number [id number]    Implantable Lead Implant Date  XL:[id number]    Implantable Lead Location Detail 1 APEX    Implantable Lead Location [id number]    Lead Channel Setting Sensing Sensitivity 0.3 mV   Lead Channel Setting Pacing Pulse Width 0.4 ms   Lead Channel Setting Pacing Amplitude 0.5 V   Lead Channel Impedance Value 418 ohm   Lead Channel Sensing Intrinsic Amplitude 1.25 mV   Lead Channel Pacing Threshold Amplitude 1.0 V   Lead Channel Pacing Threshold Pulse Width 0.4 ms   Lead Channel Impedance Value 361 ohm   Lead Channel Sensing Intrinsic Amplitude 2.5 mV   Lead Channel Pacing Threshold Amplitude 1 V   Lead Channel Pacing Threshold Pulse Width 0.4 ms   HighPow Impedance 285 ohm   HighPow Impedance 41 ohm   HighPow Impedance 52 ohm   Battery Status RRT    Battery Voltage 2.51 V   Brady Statistic RA Percent Paced 0 %   Brady Statistic RV Percent Paced 0.09 %   Brady Statistic AP VP Percent 0 %   Brady Statistic AS VP Percent 0.08 %   Brady Statistic AP VS Percent 0 %   Brady Statistic AS VS Percent 99.92 %   Eval Rhythm SR 82   HM DIABETES EYE EXAM     Status: None   Collection Time: 02/24/19 12:00 AM  Result Value Ref Range   HM Diabetic Eye Exam    POCT glycosylated hemoglobin (Hb A1C)     Status: Abnormal   Collection Time: 02/25/19 11:08 AM  Result Value Ref Range   Hemoglobin A1C 6.8 (A) 4.0 - 5.6 %   HbA1c POC (<> result, manual entry)     HbA1c, POC (prediabetic range)     HbA1c, POC (controlled diabetic range)    Novel Coronavirus, NAA ([facility])     Status: None   Collection Time: 03/16/19 12:00 AM   Specimen: Oropharyngeal(OP) collection in vial transport medium   OROPHARYNGEA  TESTING  Result Value Ref Range   SARS-CoV-2, NAA Not Detected Not Detected  Comment: This nucleic acid amplification test was developed and its performance characteristics determined by [employer]. Nucleic acid amplification tests include PCR and TMA. This test has not been FDA cleared or approved. This test has been  authorized by FDA under an Emergency Use Authorization (EUA). This test is only authorized for the duration of time the declaration that circumstances exist justifying the authorization of the emergency use of in vitro diagnostic tests for detection of SARS-CoV-2 virus and/or diagnosis of COVID-19 infection under section 564(b)(1) of the Act, 21 U.S.C. [id number]) (1), unless the authorization is terminated or revoked sooner. When diagnostic testing is negative, the possibility of a false negative result should be considered in the context of a patient's recent exposures and the presence of clinical signs and symptoms consistent with COVID-19. An individual without symptoms of COVID-19 and who is not shedding SARS-CoV-2 virus would  expect to have a negative (not detected) result in this assay.   Basic Metabolic Panel (BMET)     Status: Abnormal   Collection Time: 03/16/19  9:44 AM  Result Value Ref Range   Glucose 209 (H) 65 - 99 mg/dL   BUN 23 8 - 27 mg/dL   Creatinine, Ser 1.19 0.76 - 1.27 mg/dL   GFR calc non Af Amer 61 >59 mL/min/1.73   GFR calc Af Amer 71 >59 mL/min/1.73   BUN/Creatinine Ratio 19 10 - 24   Sodium 137 134 - 144 mmol/L   Potassium 5.2 3.5 - 5.2 mmol/L   Chloride 99 96 - 106 mmol/L   CO2 25 20 - 29 mmol/L   Calcium 9.0 8.6 - 10.2 mg/dL  CBC w/Diff     Status: None   Collection Time: 03/16/19  9:44 AM  Result Value Ref Range   WBC 7.2 3.4 - 10.8 x10E3/uL   RBC 4.52 4.14 - 5.80 x10E6/uL   Hemoglobin 13.3 13.0 - 17.7 g/dL   Hematocrit 41.3 37.5 - 51.0 %   MCV 91 79 - 97 fL   MCH 29.4 26.6 - 33.0 pg   MCHC 32.2 31.5 - 35.7 g/dL   RDW 13.1 11.6 - 15.4 %   Platelets 229 150 - 450 x10E3/uL   Neutrophils 70 Not Estab. %   Lymphs 17 Not Estab. %   Monocytes 7 Not Estab. %   Eos 5 Not Estab. %   Basos 1 Not Estab. %   Neutrophils Absolute 5.1 1.4 - 7.0 x10E3/uL   Lymphocytes Absolute 1.2 0.7 - 3.1 x10E3/uL   Monocytes Absolute 0.5 0.1 - 0.9 x10E3/uL   EOS  (ABSOLUTE) 0.4 0.0 - 0.4 x10E3/uL   Basophils Absolute 0.1 0.0 - 0.2 x10E3/uL   Immature Granulocytes 0 Not Estab. %   Immature Grans (Abs) 0.0 0.0 - 0.1 x10E3/uL  Glucose, capillary     Status: Abnormal   Collection Time: 03/19/19 12:43 PM  Result Value Ref Range   Glucose-Capillary 137 (H) 70 - 99 mg/dL   Comment 1 Notify RN    Comment 2 Document in Chart   Prepare RBC     Status: None   Collection Time: 03/19/19  1:00 PM  Result Value Ref Range   Order Confirmation      ORDER PROCESSED BY BLOOD BANK Performed at [facility], [address], [place], [place] [address]   Type and screen     Status: None   Collection Time: 03/19/19  1:00 PM  Result Value Ref Range   ABO/RH(D) A POS    Antibody  Screen NEG    Sample Expiration 03/22/2019,2359    Unit Number [id number]    Blood Component Type RED CELLS,LR    Unit division 00    Status of Unit REL FROM [facility]    Transfusion Status OK TO TRANSFUSE    Crossmatch Result      Compatible Performed at [facility], [place] [address], [place], [place] [address]    Unit Number [id number]    Blood Component Type RED CELLS,LR    Unit division 00    Status of Unit REL FROM [facility]    Transfusion Status OK TO TRANSFUSE    Crossmatch Result Compatible   Surgical PCR screen     Status: None   Collection Time: 03/19/19  1:00 PM   Specimen: Nasal Mucosa; Nasal Swab  Result Value Ref Range   MRSA, PCR NEGATIVE NEGATIVE   Staphylococcus aureus NEGATIVE NEGATIVE    Comment: (NOTE) The Xpert SA Assay (FDA approved for NASAL specimens in patients 18 years of age and older), is one component of a comprehensive surveillance program. It is not intended to diagnose infection nor to guide or monitor treatment. Performed at [facility], [place] [address], [place], [place] [address]   BPAM RBC     Status: None   Collection Time: 03/19/19  1:00 PM  Result Value Ref Range   Blood Product Unit Number [id number]    PRODUCT  CODE [id number]    Unit Type and Rh [id number]    Blood Product Expiration Date [id number]    Blood Product Unit Number [id number]    PRODUCT CODE [id number]    Unit Type and Rh 6200    Blood Product Expiration Date [id number]   ECHO INTRAOPERATIVE TEE (ANESTHESIA ONLY)     Status: None (In process)   Collection Time: 03/19/19  4:33 PM  Result Value Ref Range   Weight 3,408 oz   Height 73 in   BP 143/79 mmHg  Glucose, capillary     Status: Abnormal   Collection Time: 03/19/19  6:04 PM  Result Value Ref Range   Glucose-Capillary 112 (H) 70 - 99 mg/dL  Glucose, capillary     Status: Abnormal   Collection Time: 03/19/19 10:04 PM  Result Value Ref Range   Glucose-Capillary 292 (H) 70 - 99 mg/dL  Glucose, capillary     Status: Abnormal   Collection Time: 03/20/19  7:31 AM  Result Value Ref Range   Glucose-Capillary 156 (H) 70 - 99 mg/dL  Novel Coronavirus, NAA (Hosp order, Send-out to [facility]; TAT 18-24 hrs     Status: None   Collection Time: 05/04/19  9:30 AM   Specimen: Nasopharyngeal Swab; Respiratory  Result Value Ref Range   SARS-CoV-2, NAA NOT DETECTED NOT DETECTED    Comment: (NOTE) This nucleic acid amplification test was developed and its performance characteristics determined by [employer]. Nucleic acid amplification tests include PCR and TMA. This test has not been FDA cleared or approved. This test has been authorized by FDA under an Emergency Use Authorization (EUA). This test is only authorized for the duration of time the declaration that circumstances exist justifying the authorization of the emergency use of in vitro diagnostic tests for detection of SARS-CoV-2 virus and/or diagnosis of COVID-19 infection under section 564(b)(1) of the Act, 21 U.S.C. [id number]) (1), unless the authorization is terminated or revoked sooner. When diagnostic testing is negative, the possibility of a false negative result should be considered in the context of a  patient's recent exposures and the presence of clinical signs and symptoms consistent with COVID-19. An individual without symptoms of COVID- 19 and who is not shedding SARS-CoV-2 vi rus would expect to have a negative (not detected) result in this assay. Performed At: [facility] [place], [place] [id number] Rush Farmer MD [id number]    Coronavirus Source NASOPHARYNGEAL     Comment: Performed at [facility], [place] [address] [place], [place] [address]  Glucose, capillary     Status: Abnormal   Collection Time: 05/05/19  8:25 AM  Result Value Ref Range   Glucose-Capillary 181 (H) 70 - 99 mg/dL  APTT     Status: None   Collection Time: 05/05/19  9:03 AM  Result Value Ref Range   aPTT 32 24 - 36 seconds    Comment: Performed at [facility], [place] [address], [place], [place] [address]  CBC WITH DIFFERENTIAL     Status: None   Collection Time: 05/05/19  9:03 AM  Result Value Ref Range   WBC 8.1 4.0 - 10.5 K/uL   RBC 4.69 4.22 - 5.81 MIL/uL   Hemoglobin 14.0 13.0 - 17.0 g/dL   HCT 44.3 39.0 - 52.0 %   MCV 94.5 80.0 - 100.0 fL   MCH 29.9 26.0 - 34.0 pg   MCHC 31.6 30.0 - 36.0 g/dL   RDW 13.9 11.5 - 15.5 %   Platelets 194 150 - 400 K/uL   nRBC 0.0 0.0 - 0.2 %   Neutrophils Relative % 74 %   Neutro Abs 6.0 1.7 - 7.7 K/uL   Lymphocytes Relative 16 %   Lymphs Abs 1.3 0.7 - 4.0 K/uL   Monocytes Relative 5 %   Monocytes Absolute 0.4 0.1 - 1.0 K/uL   Eosinophils Relative 4 %   Eosinophils Absolute 0.3 0.0 - 0.5 K/uL   Basophils Relative 1 %   Basophils Absolute 0.1 0.0 - 0.1 K/uL   Immature Granulocytes 0 %   Abs Immature Granulocytes 0.03 0.00 - 0.07 K/uL    Comment: Performed at [facility], [place] [address] [place], [place] [address]  Comprehensive metabolic panel     Status: Abnormal   Collection Time: 05/05/19  9:03 AM  Result Value Ref Range   Sodium 134 (L) 135 - 145 mmol/L   Potassium 4.5 3.5 - 5.1  mmol/L   Chloride 99 98 - 111 mmol/L   CO2 27 22 - 32 mmol/L   Glucose, Bld 228 (H) 70 - 99 mg/dL   BUN 26 (H) 8 - 23 mg/dL   Creatinine, Ser 1.19 0.61 - 1.24 mg/dL   Calcium 8.7 (L) 8.9 - 10.3 mg/dL   Total Protein 6.6 6.5 - 8.1 g/dL   Albumin 4.0 3.5 - 5.0 g/dL   AST 20 15 - 41 U/L   ALT 19 0 - 44 U/L   Alkaline Phosphatase 86 38 - 126 U/L   Total Bilirubin 0.7 0.3 - 1.2 mg/dL   GFR calc non Af Amer >60 >60 mL/min   GFR calc Af Amer >60 >60 mL/min   Anion gap 8 5 - 15    Comment: Performed at [facility], [place] [address] [place], [place] [address]  Protime-INR     Status: None   Collection Time: 05/05/19  9:03 AM  Result Value Ref Range   Prothrombin Time 12.6 11.4 - 15.2 seconds   INR 1.0 0.8 - 1.2    Comment: (NOTE)  INR goal varies based on device and disease states. Performed at [facility], [place] [address], [place], [place] [address]   Type and screen Order type and screen if day of surgery is less than 15 days from draw of preadmission visit or order morning of surgery if day of surgery is greater than 6 days from preadmission visit.     Status: None (Preliminary result)   Collection Time: 05/05/19  9:03 AM  Result Value Ref Range   ABO/RH(D) A POS    Antibody Screen POS    Sample Expiration 05/07/2019,2359    Antibody Identification NO CLINICALLY SIGNIFICANT ANTIBODY IDENTIFIED    DAT, IgG NEG    Unit Number [id number]    Blood Component Type RED CELLS,LR    Unit division 00    Status of Unit ALLOCATED    Transfusion Status OK TO TRANSFUSE    Crossmatch Result      COMPATIBLE Performed at [place] [address], [place], [place] [address]    Unit Number [id number]    Blood Component Type RED CELLS,LR    Unit division 00    Status of Unit ALLOCATED    Transfusion Status OK TO TRANSFUSE    Crossmatch Result COMPATIBLE   Urinalysis, Routine w reflex microscopic     Status: Abnormal    Collection Time: 05/05/19  9:03 AM  Result Value Ref Range   Color, Urine YELLOW YELLOW   APPearance CLEAR CLEAR   Specific Gravity, Urine 1.010 1.005 - 1.030   pH 5.0 5.0 - 8.0   Glucose, UA >=500 (A) NEGATIVE mg/dL   Hgb urine dipstick NEGATIVE NEGATIVE   Bilirubin Urine NEGATIVE NEGATIVE   Ketones, ur NEGATIVE NEGATIVE mg/dL   Protein, ur NEGATIVE NEGATIVE mg/dL   Nitrite NEGATIVE NEGATIVE   Leukocytes,Ua NEGATIVE NEGATIVE   WBC, UA 0-5 0 - 5 WBC/hpf   Bacteria, UA NONE SEEN NONE SEEN    Comment: Performed at [facility], [place] [address] [place], [place] [address]  Surgical pcr screen     Status: Abnormal   Collection Time: 05/05/19  9:03 AM   Specimen: Nasal Mucosa; Nasal Swab  Result Value Ref Range   MRSA, PCR NEGATIVE (A) NEGATIVE   Staphylococcus aureus NEGATIVE (A) NEGATIVE    Comment: Performed at [facility], [place] [address] [place], [place] [address]  BPAM RBC     Status: None (Preliminary result)   Collection Time: 05/05/19  9:03 AM  Result Value Ref Range   Blood Product Unit Number [id number]    PRODUCT CODE [id number]    Unit Type and Rh 6200    Blood Product Expiration Date [id number]    Blood Product Unit Number [id number]    PRODUCT CODE [id number]    Unit Type and Rh [id number]    Blood Product Expiration Date [id number]   Type and screen [place]     Status: None   Collection Time: 05/07/19  6:00 AM  Result Value Ref Range   ABO/RH(D) A POS    Antibody Screen POS    Sample Expiration      05/10/2019,2359 Performed at [facility] [place] [address], [place], [place] [address]   Glucose, capillary     Status: None   Collection Time: 05/07/19  6:13 AM  Result Value Ref Range   Glucose-Capillary 97 70 - 99 mg/dL     Estimated body mass index is 30.82 kg/m as calculated  from the following:   Height as of 05/05/19: 5\' 11"  (1.803 m).   Weight as of 05/05/19: 100.2  kg.   Imaging Review Plain radiographs demonstrate severe degenerative joint disease of the right knee(s). The overall alignment ismild varus. The bone quality appears to be fair for age and reported activity level.      Assessment/Plan:  End stage arthritis, right knee   The patient history, physical examination, clinical judgment of the provider and imaging studies are consistent with end stage degenerative joint disease of the right knee(s) and total knee arthroplasty is deemed medically necessary. The treatment options including medical management, injection therapy arthroscopy and arthroplasty were discussed at length. The risks and benefits of total knee arthroplasty were presented and reviewed. The risks due to aseptic loosening, infection, stiffness, patella tracking problems, thromboembolic complications and other imponderables were discussed. The patient acknowledged the explanation, agreed to proceed with the plan and consent was signed. Patient is being admitted for inpatient treatment for surgery, pain control, PT, OT, prophylactic antibiotics, VTE prophylaxis, progressive ambulation and ADL's and discharge planning. The patient is planning to be discharged home with home health services     Patient's anticipated LOS is less than 2 midnights, meeting these requirements: - Younger than 54 - Lives within 1 hour of care - Has a competent adult at home to recover with post-op recover - NO history of  - Chronic pain requiring opiods  - Diabetes  - Coronary Artery Disease  - Heart failure  - Heart attack  - Stroke  - DVT/VTE  - Cardiac arrhythmia  - Respiratory Failure/COPD  - Renal failure  - Anemia  - Advanced Liver disease

## 2019-05-07 NOTE — Anesthesia Procedure Notes (Signed)
Spinal  Start time: 05/07/2019 7:36 AM End time: 05/07/2019 7:38 AM Staffing Anesthesiologist: Effie Berkshire, MD Performed: anesthesiologist  Preanesthetic Checklist Completed: patient identified, site marked, surgical consent, pre-op evaluation, timeout performed, IV checked, risks and benefits discussed and monitors and equipment checked Spinal Block Patient position: sitting Prep: site prepped and draped and DuraPrep Location: L3-4 Injection technique: single-shot Needle Needle type: Pencan  Needle gauge: 24 G Needle length: 10 cm Needle insertion depth: 10 cm Additional Notes Patient tolerated well. No immediate complications.

## 2019-05-07 NOTE — Progress Notes (Signed)
Pt refused CPAP. Pt states he is only here for one night and does not want to use tonight. RT will continue to monitor.

## 2019-05-08 DIAGNOSIS — M1711 Unilateral primary osteoarthritis, right knee: Secondary | ICD-10-CM | POA: Diagnosis not present

## 2019-05-08 LAB — GLUCOSE, CAPILLARY
Glucose-Capillary: 136 mg/dL — ABNORMAL HIGH (ref 70–99)
Glucose-Capillary: 185 mg/dL — ABNORMAL HIGH (ref 70–99)

## 2019-05-08 LAB — CBC
HCT: 38.3 % — ABNORMAL LOW (ref 39.0–52.0)
Hemoglobin: 12 g/dL — ABNORMAL LOW (ref 13.0–17.0)
MCH: 29.5 pg (ref 26.0–34.0)
MCHC: 31.3 g/dL (ref 30.0–36.0)
MCV: 94.1 fL (ref 80.0–100.0)
Platelets: 204 10*3/uL (ref 150–400)
RBC: 4.07 MIL/uL — ABNORMAL LOW (ref 4.22–5.81)
RDW: 13.9 % (ref 11.5–15.5)
WBC: 12.9 10*3/uL — ABNORMAL HIGH (ref 4.0–10.5)
nRBC: 0 % (ref 0.0–0.2)

## 2019-05-08 NOTE — Progress Notes (Signed)
    Home health agencies that serve [phone].        [place] Quality of Patient Care Rating Patient Survey Summary Rating  [facility] [phone] 4  out of 5 stars 5 out of [place] [phone] 2  out of 5 stars 4 out of [place] [phone]) [phone] 4  out of 5 stars 4 out of [place] [phone] 4 out of 5 stars 4 out of [place] [phone]  out of 5 stars 5 out of [place] [phone] 4 out of 5 stars 4 out of 5 stars  [facility] [place] [phone] 3  out of 5 stars 4 out of [place] [phone] 3 out of 5 stars 4 out of 5 stars  [facility] [phone] 4  out of 5 stars 3 out of [place] [phone] 3  out of 5 stars 4 out of [place] [phone]  out of 5 stars 4 out of [place] [phone] 4  out of 5 stars 4 out of [place] [phone]  out of 5 stars 3 out of [place] number Footnote as displayed on [place]  1 This agency provides services under a federal waiver program to non-traditional, chronic long term population.  2 This agency provides services to a special needs population.  3 Not Available.  4 The number of patient episodes for this measure is too small to report.  5 This measure currently does not have data or provider has been certified/recertified for less than 6 months.  6 The national average for this measure is not provided because of state-to-state differences in data collection.  7 Medicare is not displaying rates for this measure for any home health agency, because of an issue with  the data.  8 There were problems with the data and they are being corrected.  9 Zero, or very few, patients met the survey's rules for inclusion. The scores shown, if any, reflect a very small number of surveys and may not accurately tell how an agency is doing.  10 Survey results are based on less than 12 months of data.  11 Fewer than 70 patients completed the survey. Use the scores shown, if any, with caution as the number of surveys may be too low to accurately tell how an agency is doing.  12 No survey results are available for this period.  13 Data suppressed by CMS for one or more quarters.

## 2019-05-08 NOTE — Progress Notes (Signed)
Pt stable at time of d/c. No needs at time of d/c. Pt ace wrap within normal limits at time of d/c. Pt did not need home equipment.

## 2019-05-08 NOTE — Plan of Care (Signed)
Pt stable at this time. Pt to d/c home.  ?

## 2019-05-08 NOTE — TOC Progression Note (Signed)
Transition of Care [facility]) - Progression Note    Patient Details  Name: Paul [name] MRN: [id number] Date of Birth: 20-Jul-1945  [facility] CM/SW Contact  Joaquin Courts, RN Phone Number: 05/08/2019, 11:24 AM  Clinical Narrative:    CM spoke with patient at bedside, patient set up with [facility] for [place]. Reports has rolling walker and 3-in-1 at home.    Expected Discharge Plan: [place] Barriers to Discharge: No Barriers Identified  Expected Discharge Plan and Services Expected Discharge Plan: [place]   Discharge Planning Services: CM Consult Post Acute Care Choice: [place] arrangements for the past 2 months: Single Family Home Expected Discharge Date: 05/08/19               DME Arranged: N/A DME Agency: NA       HH Arranged: PT [place] Agency: [facility] (formerly [employer])     Representative spoke with at [place]: pre arranged in md office   Social Determinants of Health ([place]) Interventions    Readmission Risk Interventions No flowsheet data found.

## 2019-05-08 NOTE — Discharge Summary (Signed)
Patient ID: Paul [name] MRN: [id number] DOB/AGE: January 19, 1946 73 y.o.  Admit date: 05/07/2019 Discharge date: 05/08/2019  Admission Diagnoses:  Principal Problem:   Primary osteoarthritis of right knee   Discharge Diagnoses:  Same  Past Medical History:  Diagnosis Date  . Aftercare following removal/replacement defibrillator    a. defibrillator removed 03/2019.  Marland Kitchen Arthritis    "thumbs, neck, knees" (06/01/2013)  . Cardiomyopathy, dilated, nonischemic ([place]) 10/20/2012  . Cataract   . CHF (congestive heart failure) ([place])   . Chronic systolic heart failure ([place])   . CKD (chronic kidney disease), stage III   . Coronary artery disease   . DVT (deep vein thrombosis) in pregnancy   . 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  . Erectile dysfunction   . Frequent PVCs   . GERD (gastroesophageal reflux disease)   . Hearing aid worn    B/L  . Hypertension   . Hypogonadism male   . Hypovitaminosis D   . 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 NICM (nonischemic cardiomyopathy) ([place])   . Sleep apnea    cpap  . Type II diabetes mellitus ([place])   . Ventricular tachycardia ([place])    observed during stress testing, subsequent VT on EPS  . Wears glasses     Surgeries: Procedure(s): TOTAL KNEE ARTHROPLASTY on 05/07/2019   Consultants:   Discharged Condition: Improved  Hospital Course: Paul [name] is an 73 y.o. male who was admitted 05/07/2019 for operative treatment ofPrimary osteoarthritis of right knee. Patient has severe unremitting pain that affects sleep, daily activities, and work/hobbies. After pre-op clearance the patient was taken to the operating room on 05/07/2019 and underwent  Procedure(s): TOTAL KNEE ARTHROPLASTY.    Patient was given perioperative antibiotics:  Anti-infectives (From admission, onward)   Start     Dose/Rate Route Frequency Ordered Stop   05/07/19 1400  ceFAZolin (ANCEF) IVPB 2g/100  mL premix     2 g 200 mL/hr over 30 Minutes Intravenous Every 6 hours 05/07/19 1226 05/07/19 2125   05/07/19 0600  ceFAZolin (ANCEF) IVPB 2g/100 mL premix     2 g 200 mL/hr over 30 Minutes Intravenous On call to O.R. 05/07/19 0532 05/07/19 0739       Patient was given sequential compression devices, early ambulation, and chemoprophylaxis to prevent DVT.  Patient benefited maximally from hospital stay and there were no complications.    Recent vital signs:  Patient Vitals for the past 24 hrs:  BP Temp Temp src Pulse Resp SpO2 Height Weight  05/08/19 0437 116/77 98.2 F (36.8 C) Oral 62 16 99 % - -  05/08/19 0047 110/65 98.2 F (36.8 C) Oral 70 16 98 % - -  05/07/19 2041 123/74 98.2 F (36.8 C) Oral 66 16 97 % - -  05/07/19 1559 (!) 141/77 98.3 F (36.8 C) Oral (!) 59 16 100 % - -  05/07/19 1426 127/72 97.7 F (36.5 C) Oral 62 16 100 % - -  05/07/19 1310 (!) 153/86 97.6 F (36.4 C) Oral (!) 54 16 100 % - -  05/07/19 1220 - - - - - - 5' 11" (1.803 m) 100.2 kg  05/07/19 1145 (!) 148/77 - - 60 11 100 % - -  05/07/19 1045 139/77 - - (!) 50 12 100 % - -  05/07/19 1030 140/83 - - (!) 50 (!) 8 100 % - -  05/07/19 1015 130/76 - - (!) 51 (!) 8 100 % - -  05/07/19 1000 122/71 - - (!) 56 10 100 % - -  05/07/19 0945 117/71 - - (!) 55 11 100 % - -     Recent laboratory studies:  Recent Labs    05/08/19 0300  WBC 12.9*  HGB 12.0*  HCT 38.3*  PLT 204     Discharge Medications:   Allergies as of 05/08/2019      Reactions   Lisinopril Swelling   angioedema      Medication List    TAKE these medications   acetaminophen 500 MG tablet Commonly known as: TYLENOL Take 1,000 mg by mouth every 6 (six) hours as needed for moderate pain or headache.   aspirin EC 325 MG tablet Take 1 tablet (325 mg total) by mouth 2 (two) times daily after a meal. Take x 1 month post op to decrease risk of blood clots. What changed:   medication strength  how much to take  when to take  this  additional instructions   carvedilol 6.25 MG tablet Commonly known as: COREG TAKE 1&1/2 TABLETS BY MOUTH TWO TIMES DAILY What changed: See the new instructions.   docusate sodium 100 MG capsule Commonly known as: Colace Take 1 capsule (100 mg total) by mouth 2 (two) times daily.   empagliflozin 10 MG Tabs tablet Commonly known as: Jardiance Take 10 mg by mouth daily.   glucose blood test strip Commonly known as: OneTouch Verio Use to check blood sugar 3 times a day.   Insulin Glargine 100 UNIT/ML Solostar Pen Commonly known as: Lantus SoloStar Inject 38 Units into the skin at bedtime.   Lancets Misc Glucose monitor to check blood sugars tid dx code E11.9 brand per insurance coverage   losartan 25 MG tablet Commonly known as: COZAAR Take 1 tablet (25 mg total) by mouth daily.   metFORMIN 500 MG 24 hr tablet Commonly known as: GLUCOPHAGE-XR Take 4 tablets (2,000 mg total) by mouth daily with breakfast. What changed: when to take this   OneTouch Verio w/Device Kit Use to check blood sugar 3 times a day.   oxyCODONE-acetaminophen 5-325 MG tablet Commonly known as: PERCOCET/ROXICET Take 1-2 tablets by mouth every 6 (six) hours as needed for severe pain.   rosuvastatin 10 MG tablet Commonly known as: CRESTOR TAKE 1 TABLET (10 MG TOTAL) BY MOUTH DAILY.   spironolactone 25 MG tablet Commonly known as: ALDACTONE TAKE 1 TABLET BY MOUTH ONCE A DAY   tamsulosin 0.4 MG Caps capsule Commonly known as: FLOMAX Take 0.4 mg by mouth at bedtime.   tiZANidine 2 MG tablet Commonly known as: ZANAFLEX Take 1 tablet (2 mg total) by mouth every 8 (eight) hours as needed for muscle spasms.   Trulicity 1.5 OJ/5.0KX Sopn Generic drug: Dulaglutide INJECT 1.5MG (1 PEN ) INTO THE SKIN ONCE A WEEK (REPLACE VICTOZA) What changed: See the new instructions.   Unifine Pentips 32G X 4 MM Misc Generic drug: Insulin Pen Needle USE TWICE DAILY WITH LANTUS AND VICTOZA             Durable Medical Equipment  (From admission, onward)         Start     Ordered   05/07/19 1227  DME Walker rolling  Once    Question:  Patient needs a walker to treat with the following condition  Answer:  Primary osteoarthritis of right knee   05/07/19 1226   05/07/19 1227  DME 3 n 1  Once     05/07/19 1226  Diagnostic Studies: Dg Chest 2 View  Result Date: 05/05/2019 CLINICAL DATA:  73-year-old male with preop chest radiograph. EXAM: CHEST - 2 VIEW COMPARISON:  Chest radiograph dated 04/28/2017. FINDINGS: There has been interval removal of the AICD. The lungs are clear. There is no pleural effusion or pneumothorax. The cardiac silhouette is within normal limits. The aorta is tortuous. No acute osseous pathology. IMPRESSION: No active cardiopulmonary disease. Electronically Signed   By: Arash  Radparvar M.D.   On: 05/05/2019 11:18    Disposition: Discharge disposition: 01-Home or Self Care       Discharge Instructions    Call MD / Call 911   Complete by: As directed    If you experience chest pain or shortness of breath, CALL 911 and be transported to the hospital emergency room.  If you develope a fever above 101 F, pus (white drainage) or increased drainage or redness at the wound, or calf pain, call your surgeon's office.   Constipation Prevention   Complete by: As directed    Drink plenty of fluids.  Prune juice may be helpful.  You may use a stool softener, such as Colace (over the counter) 100 mg twice a day.  Use MiraLax (over the counter) for constipation as needed.   Diet - low sodium heart healthy   Complete by: As directed    Increase activity slowly as tolerated   Complete by: As directed       Follow-up Information    Graves, John, MD. Schedule an appointment as soon as possible for a visit in 2 weeks.   Specialty: Orthopedic Surgery Contact information: [address] [place] [address] [phone]            Signed: Danielle  Laliberte 05/08/2019, 9:34 AM     

## 2019-05-08 NOTE — Progress Notes (Signed)
   PATIENT ID: Paul [name]   1 Day Post-Op Procedure(s) (LRB): TOTAL KNEE ARTHROPLASTY (Right)  Subjective: Doing well, min pain. Ready to d/c home today.   Objective:  Vitals:   05/08/19 0047 05/08/19 0437  BP: 110/65 116/77  Pulse: 70 62  Resp: 16 16  Temp: 98.2 F (36.8 C) 98.2 F (36.8 C)  SpO2: 98% 99%     R Knee dressing c/d/i Wiggles toes, distally NVI Calves soft, nontender  Labs:  Recent Labs    05/08/19 0300  HGB 12.0*   Recent Labs    05/08/19 0300  WBC 12.9*  RBC 4.07*  HCT 38.3*  PLT 204  No results for input(s): NA, K, CL, CO2, BUN, CREATININE, GLUCOSE, CALCIUM in the last 72 hours.  Assessment and Plan: 1 day s/p R TKA Up with PT wbat DM- sliding scale  D/c home today when cleared by PT    VTE proph: asa,scds

## 2019-05-08 NOTE — Progress Notes (Signed)
Physical Therapy Treatment Patient Details Name: Paul [name] MRN: [id number] DOB: [birth year] Today's Date: 05/08/2019    History of Present Illness Patient is 73 y.o. male s/p Rt TKA on 05/07/19 with PMH significant for CAD, CHF, nonischemic cardiomyopathy, AICD ([employer], implanted 05/09/10), HTN, DM, nonsustained ventricular tachycardia, OSA, GERD, and past Lt TKA in 2018.    PT Comments    Pt with good progression of mobility today. Stair instruction completed. Patient safe to D/C from a mobility standpoint based on progression towards goals set on PT eval.    Follow Up Recommendations  Follow surgeon's recommendation for DC plan and follow-up therapies     Equipment Recommendations  None recommended by PT    Recommendations for Other Services       Precautions / Restrictions Precautions Precautions: Fall Restrictions Weight Bearing Restrictions: Yes RLE Weight Bearing: Weight bearing as tolerated    Mobility  Bed Mobility Overal bed mobility: Modified Independent Bed Mobility: Supine to Sit           General bed mobility comments: Bed flat with pt using rail to pull up into long sitting, then rotated arount to sitting edge of bed. no dizziness or other symptoms with sitting at edge of bed.  Transfers Overall transfer level: Modified independent Equipment used: Rolling walker (2 wheeled) Transfers: Sit to/from Stand Sit to Stand: Supervision         General transfer comment: reminder cues for safe handplacement with standing from bed a lowest height. no physical assist needed. Pt does need RW to steady/stabilize with standing.  Ambulation/Gait Ambulation/Gait assistance: Min guard Gait Distance (Feet): 100 Feet Assistive device: Rolling walker (2 wheeled) Gait Pattern/deviations: Step-through pattern;Decreased stride length;Decreased stance time - right;Antalgic Gait velocity: slightly decreased   General Gait Details: pt with heavy UE reliance on  walker with gait. cues needed for walker position with gait as pt tends to keep it too close. cues for right knee flexion with swing phase. no knee buckling noted this session in stance.   Stairs Stairs: Yes Stairs assistance: Min guard Stair Management: Step to pattern;Forwards;With walker Number of Stairs: 1 General stair comments: cues for sequencing and technique needed. no right knee buckling with stairs.       Cognition Arousal/Alertness: Awake/alert Behavior During Therapy: WFL for tasks assessed/performed Overall Cognitive Status: Within Functional Limits for tasks assessed            Exercises Total Joint Exercises Ankle Circles/Pumps: AROM;Strengthening;Both;5 reps Quad Sets: AROM;Strengthening;Right;10 reps;Limitations Quad Sets Limitations: 5 sec holds Heel Slides: AAROM;Right;Strengthening;10 reps;Supine Straight Leg Raises: AAROM;Strengthening;Right;10 reps;Supine Goniometric ROM: supine in bed: 10-70 degrees flexion AROM, up to 80 degrees flexion AAROM     Pertinent Vitals/Pain Pain Assessment: 0-10 Pain Score: 3  Pain Location: right thigh>knee Pain Descriptors / Indicators: Sore Pain Intervention(s): Limited activity within patient's tolerance;Monitored during session;Premedicated before session;Repositioned;Ice applied     PT Goals (current goals can now be found in the care plan section) Acute Rehab PT Goals Patient Stated Goal: return home and get back to indpendence and work PT Goal Formulation: With patient Time For Goal Achievement: 05/14/19 Potential to Achieve Goals: Good    Frequency    7X/week      PT Plan Current plan remains appropriate    Co-evaluation              AM-PAC PT "6 Clicks" Mobility   Outcome Measure  Help needed turning from your back to your side while in a flat bed  without using bedrails?: None Help needed moving from lying on your back to sitting on the side of a flat bed without using bedrails?:  None Help needed moving to and from a bed to a chair (including a wheelchair)?: None Help needed standing up from a chair using your arms (e.g., wheelchair or bedside chair)?: None Help needed to walk in hospital room?: None Help needed climbing 3-5 steps with a railing? : A Little 6 Click Score: 23    End of Session Equipment Utilized During Treatment: Gait belt Activity Tolerance: Patient tolerated treatment well Patient left: with call bell/phone within reach;in chair;with family/visitor present Nurse Communication: Mobility status;Other (comment)(ready for d/c from therapy standpoint) PT Visit Diagnosis: Muscle weakness (generalized) (M62.81);Difficulty in walking, not elsewhere classified (R26.2)     Time: [id number] PT Time Calculation (min) (ACUTE ONLY): 24 min  Charges:  $Gait Training: 8-22 mins $Therapeutic Exercise: 8-22 mins                    Willow Ora, PTA, CLT Acute [place] Office number: [phone]  Willow Ora 05/08/2019, 11:52 AM

## 2019-05-10 ENCOUNTER — Encounter (HOSPITAL_COMMUNITY): Payer: Self-pay | Admitting: Orthopedic Surgery

## 2019-05-11 LAB — TYPE AND SCREEN
ABO/RH(D): A POS
Antibody Screen: POSITIVE
Unit division: 0
Unit division: 0

## 2019-05-11 LAB — BPAM RBC
Blood Product Expiration Date: 202012152359
Blood Product Expiration Date: 202012152359
Unit Type and Rh: 6200
Unit Type and Rh: 6200

## 2019-05-17 MED FILL — ERYTHROMYCIN EYE OINTMENT: 5 | 10 days supply | Qty: 4 | Fill #0

## 2019-05-19 MED FILL — OXYCODONE-ACETAMINOPHEN 5-3: 5-325 | 10 days supply | Qty: 40 | Fill #0

## 2019-05-20 MED FILL — tiZANidine HCL 4 MG TABS: 4 | 13 days supply | Qty: 40 | Fill #0

## 2019-05-24 MED FILL — LOSARTAN POTASSIUM 25 MG TA: 25 | 30 days supply | Qty: 30 | Fill #3

## 2019-05-24 MED FILL — JARDIANCE 10 MG TABLET: 10 | 30 days supply | Qty: 30 | Fill #8

## 2019-05-24 MED FILL — ROSUVASTATIN CALCIUM 10 MG: 10 | 30 days supply | Qty: 30 | Fill #1

## 2019-05-24 MED FILL — TAMSULOSIN HCL 0.4 MG CAP: 0.4 | 30 days supply | Qty: 30 | Fill #7

## 2019-05-31 MED FILL — OXYCODONE-ACETAMINOPHEN 5-3: 5-325 | 8 days supply | Qty: 30 | Fill #0

## 2019-06-09 ENCOUNTER — Other Ambulatory Visit: Payer: Self-pay | Admitting: Internal Medicine

## 2019-06-09 MED FILL — TRULICITY 1.5 MG/0.5 ML PEN: 1.5 | 28 days supply | Qty: 2 | Fill #2

## 2019-06-09 MED FILL — SPIRONOLACTONE 25 MG TABS: 25 | 30 days supply | Qty: 30 | Fill #1

## 2019-06-09 MED FILL — LANTUS SOLOSTAR 100 UNITS/M: 100 | 30 days supply | Qty: 12 | Fill #11

## 2019-06-10 MED FILL — UNIFINE PENTIPS 32GX5/32: 32G X 4 MM | 30 days supply | Qty: 100 | Fill #0

## 2019-06-10 MED FILL — UNIFINE PENTIPS 32GX5/32": 32G X 4 MM | 30 days supply | Qty: 100 | Fill #0

## 2019-06-15 MED FILL — ONE TOUCH VERIO TEST STRIP: 16 days supply | Qty: 50 | Fill #3

## 2019-06-15 MED FILL — metFORMIN HCL ER 500 MG TB2: 500 | 30 days supply | Qty: 120 | Fill #5

## 2019-06-17 MED FILL — traMADol HCL 50 MG TABS: 50 | 10 days supply | Qty: 40 | Fill #0

## 2019-06-17 MED FILL — AMOXICILLIN 500 MG CAPSULE: 500 | 5 days supply | Qty: 20 | Fill #0

## 2019-06-25 MED FILL — ROSUVASTATIN CALCIUM 10 MG: 10 | 30 days supply | Qty: 30 | Fill #2

## 2019-06-25 MED FILL — CARVEDILOL 6.25 MG TABLET: 6.25 | 30 days supply | Qty: 90 | Fill #1

## 2019-06-25 MED FILL — JARDIANCE 10 MG TABLET: 10 | 30 days supply | Qty: 30 | Fill #9

## 2019-06-25 MED FILL — TAMSULOSIN HCL 0.4 MG CAP: 0.4 | 30 days supply | Qty: 30 | Fill #8

## 2019-06-25 MED FILL — LOSARTAN POTASSIUM 25 MG TA: 25 | 30 days supply | Qty: 30 | Fill #4

## 2019-07-01 ENCOUNTER — Ambulatory Visit: Payer: BC Managed Care – PPO | Admitting: Internal Medicine

## 2019-07-02 ENCOUNTER — Inpatient Hospital Stay (HOSPITAL_BASED_OUTPATIENT_CLINIC_OR_DEPARTMENT_OTHER): Payer: BC Managed Care – PPO

## 2019-07-02 ENCOUNTER — Inpatient Hospital Stay (HOSPITAL_COMMUNITY): Payer: BC Managed Care – PPO

## 2019-07-02 ENCOUNTER — Encounter (HOSPITAL_COMMUNITY): Payer: Self-pay | Admitting: Emergency Medicine

## 2019-07-02 ENCOUNTER — Emergency Department (HOSPITAL_COMMUNITY): Payer: BC Managed Care – PPO

## 2019-07-02 ENCOUNTER — Observation Stay (HOSPITAL_COMMUNITY)
Admission: EM | Admit: 2019-07-02 | Discharge: 2019-07-03 | Disposition: A | Discharge status: Home / Self Care | Payer: BC Managed Care – PPO | Attending: Internal Medicine | Admitting: Internal Medicine

## 2019-07-02 DIAGNOSIS — I499 Cardiac arrhythmia, unspecified: Secondary | ICD-10-CM | POA: Diagnosis not present

## 2019-07-02 DIAGNOSIS — G459 Transient cerebral ischemic attack, unspecified: Principal | ICD-10-CM | POA: Diagnosis present

## 2019-07-02 DIAGNOSIS — I493 Ventricular premature depolarization: Secondary | ICD-10-CM | POA: Insufficient documentation

## 2019-07-02 DIAGNOSIS — G4733 Obstructive sleep apnea (adult) (pediatric): Secondary | ICD-10-CM | POA: Insufficient documentation

## 2019-07-02 DIAGNOSIS — E162 Hypoglycemia, unspecified: Secondary | ICD-10-CM | POA: Diagnosis not present

## 2019-07-02 DIAGNOSIS — K219 Gastro-esophageal reflux disease without esophagitis: Secondary | ICD-10-CM | POA: Diagnosis not present

## 2019-07-02 DIAGNOSIS — I639 Cerebral infarction, unspecified: Secondary | ICD-10-CM | POA: Diagnosis not present

## 2019-07-02 DIAGNOSIS — Z955 Presence of coronary angioplasty implant and graft: Secondary | ICD-10-CM | POA: Insufficient documentation

## 2019-07-02 DIAGNOSIS — M19041 Primary osteoarthritis, right hand: Secondary | ICD-10-CM | POA: Insufficient documentation

## 2019-07-02 DIAGNOSIS — R29810 Facial weakness: Secondary | ICD-10-CM | POA: Diagnosis not present

## 2019-07-02 DIAGNOSIS — I6621 Occlusion and stenosis of right posterior cerebral artery: Secondary | ICD-10-CM | POA: Diagnosis not present

## 2019-07-02 DIAGNOSIS — I361 Nonrheumatic tricuspid (valve) insufficiency: Secondary | ICD-10-CM

## 2019-07-02 DIAGNOSIS — I351 Nonrheumatic aortic (valve) insufficiency: Secondary | ICD-10-CM

## 2019-07-02 DIAGNOSIS — Z96653 Presence of artificial knee joint, bilateral: Secondary | ICD-10-CM | POA: Diagnosis not present

## 2019-07-02 DIAGNOSIS — N179 Acute kidney failure, unspecified: Secondary | ICD-10-CM | POA: Diagnosis not present

## 2019-07-02 DIAGNOSIS — E119 Type 2 diabetes mellitus without complications: Secondary | ICD-10-CM

## 2019-07-02 DIAGNOSIS — M19042 Primary osteoarthritis, left hand: Secondary | ICD-10-CM | POA: Insufficient documentation

## 2019-07-02 DIAGNOSIS — I5022 Chronic systolic (congestive) heart failure: Secondary | ICD-10-CM | POA: Diagnosis not present

## 2019-07-02 DIAGNOSIS — G8320 Monoplegia of upper limb affecting unspecified side: Secondary | ICD-10-CM | POA: Diagnosis not present

## 2019-07-02 DIAGNOSIS — M47812 Spondylosis without myelopathy or radiculopathy, cervical region: Secondary | ICD-10-CM | POA: Insufficient documentation

## 2019-07-02 DIAGNOSIS — R4189 Other symptoms and signs involving cognitive functions and awareness: Secondary | ICD-10-CM | POA: Diagnosis not present

## 2019-07-02 DIAGNOSIS — Z20822 Contact with and (suspected) exposure to covid-19: Secondary | ICD-10-CM | POA: Diagnosis not present

## 2019-07-02 DIAGNOSIS — Z79899 Other long term (current) drug therapy: Secondary | ICD-10-CM | POA: Insufficient documentation

## 2019-07-02 DIAGNOSIS — Z888 Allergy status to other drugs, medicaments and biological substances status: Secondary | ICD-10-CM | POA: Diagnosis not present

## 2019-07-02 DIAGNOSIS — E785 Hyperlipidemia, unspecified: Secondary | ICD-10-CM | POA: Diagnosis not present

## 2019-07-02 DIAGNOSIS — Z7982 Long term (current) use of aspirin: Secondary | ICD-10-CM | POA: Insufficient documentation

## 2019-07-02 DIAGNOSIS — N182 Chronic kidney disease, stage 2 (mild): Secondary | ICD-10-CM | POA: Insufficient documentation

## 2019-07-02 DIAGNOSIS — I420 Dilated cardiomyopathy: Secondary | ICD-10-CM | POA: Diagnosis not present

## 2019-07-02 DIAGNOSIS — Z823 Family history of stroke: Secondary | ICD-10-CM | POA: Insufficient documentation

## 2019-07-02 DIAGNOSIS — I10 Essential (primary) hypertension: Secondary | ICD-10-CM | POA: Diagnosis present

## 2019-07-02 DIAGNOSIS — I63411 Cerebral infarction due to embolism of right middle cerebral artery: Secondary | ICD-10-CM

## 2019-07-02 DIAGNOSIS — I2510 Atherosclerotic heart disease of native coronary artery without angina pectoris: Secondary | ICD-10-CM | POA: Diagnosis not present

## 2019-07-02 DIAGNOSIS — E1122 Type 2 diabetes mellitus with diabetic chronic kidney disease: Secondary | ICD-10-CM | POA: Diagnosis not present

## 2019-07-02 DIAGNOSIS — R410 Disorientation, unspecified: Secondary | ICD-10-CM | POA: Diagnosis not present

## 2019-07-02 DIAGNOSIS — I130 Hypertensive heart and chronic kidney disease with heart failure and stage 1 through stage 4 chronic kidney disease, or unspecified chronic kidney disease: Secondary | ICD-10-CM | POA: Diagnosis not present

## 2019-07-02 DIAGNOSIS — E161 Other hypoglycemia: Secondary | ICD-10-CM | POA: Diagnosis not present

## 2019-07-02 DIAGNOSIS — M170 Bilateral primary osteoarthritis of knee: Secondary | ICD-10-CM | POA: Insufficient documentation

## 2019-07-02 DIAGNOSIS — I4510 Unspecified right bundle-branch block: Secondary | ICD-10-CM | POA: Diagnosis not present

## 2019-07-02 DIAGNOSIS — E1169 Type 2 diabetes mellitus with other specified complication: Secondary | ICD-10-CM

## 2019-07-02 DIAGNOSIS — Z794 Long term (current) use of insulin: Secondary | ICD-10-CM | POA: Insufficient documentation

## 2019-07-02 DIAGNOSIS — R4781 Slurred speech: Secondary | ICD-10-CM | POA: Diagnosis not present

## 2019-07-02 DIAGNOSIS — I6523 Occlusion and stenosis of bilateral carotid arteries: Secondary | ICD-10-CM | POA: Diagnosis not present

## 2019-07-02 LAB — COMPREHENSIVE METABOLIC PANEL
ALT: 19 U/L (ref 0–44)
AST: 18 U/L (ref 15–41)
Albumin: 3.7 g/dL (ref 3.5–5.0)
Alkaline Phosphatase: 74 U/L (ref 38–126)
Anion gap: 11 (ref 5–15)
BUN: 26 mg/dL — ABNORMAL HIGH (ref 8–23)
CO2: 25 mmol/L (ref 22–32)
Calcium: 9.1 mg/dL (ref 8.9–10.3)
Chloride: 102 mmol/L (ref 98–111)
Creatinine, Ser: 1.55 mg/dL — ABNORMAL HIGH (ref 0.61–1.24)
GFR calc Af Amer: 51 mL/min — ABNORMAL LOW (ref >60)
GFR calc non Af Amer: 44 mL/min — ABNORMAL LOW (ref >60)
Glucose, Bld: 153 mg/dL — ABNORMAL HIGH (ref 70–99)
Potassium: 5.1 mmol/L (ref 3.5–5.1)
Sodium: 138 mmol/L (ref 135–145)
Total Bilirubin: 0.9 mg/dL (ref 0.3–1.2)
Total Protein: 6 g/dL — ABNORMAL LOW (ref 6.5–8.1)

## 2019-07-02 LAB — CBC
HCT: 41 % (ref 39.0–52.0)
Hemoglobin: 13 g/dL (ref 13.0–17.0)
MCH: 30.1 pg (ref 26.0–34.0)
MCHC: 31.7 g/dL (ref 30.0–36.0)
MCV: 94.9 fL (ref 80.0–100.0)
Platelets: 214 10*3/uL (ref 150–400)
RBC: 4.32 MIL/uL (ref 4.22–5.81)
RDW: 14.1 % (ref 11.5–15.5)
WBC: 11.4 10*3/uL — ABNORMAL HIGH (ref 4.0–10.5)
nRBC: 0 % (ref 0.0–0.2)

## 2019-07-02 LAB — PROTIME-INR
INR: 1 (ref 0.8–1.2)
Prothrombin Time: 13.5 seconds (ref 11.4–15.2)

## 2019-07-02 LAB — DIFFERENTIAL
Abs Immature Granulocytes: 0.03 10*3/uL (ref 0.00–0.07)
Basophils Absolute: 0.1 10*3/uL (ref 0.0–0.1)
Basophils Relative: 1 %
Eosinophils Absolute: 0.3 10*3/uL (ref 0.0–0.5)
Eosinophils Relative: 3 %
Immature Granulocytes: 0 %
Lymphocytes Relative: 14 %
Lymphs Abs: 1.6 10*3/uL (ref 0.7–4.0)
Monocytes Absolute: 0.6 10*3/uL (ref 0.1–1.0)
Monocytes Relative: 5 %
Neutro Abs: 8.8 10*3/uL — ABNORMAL HIGH (ref 1.7–7.7)
Neutrophils Relative %: 77 %

## 2019-07-02 LAB — CBG MONITORING, ED
Glucose-Capillary: 144 mg/dL — ABNORMAL HIGH (ref 70–99)
Glucose-Capillary: 72 mg/dL (ref 70–99)

## 2019-07-02 LAB — I-STAT CHEM 8, ED
BUN: 28 mg/dL — ABNORMAL HIGH (ref 8–23)
Calcium, Ion: 1.11 mmol/L — ABNORMAL LOW (ref 1.15–1.40)
Chloride: 100 mmol/L (ref 98–111)
Creatinine, Ser: 1.5 mg/dL — ABNORMAL HIGH (ref 0.61–1.24)
Glucose, Bld: 147 mg/dL — ABNORMAL HIGH (ref 70–99)
HCT: 40 % (ref 39.0–52.0)
Hemoglobin: 13.6 g/dL (ref 13.0–17.0)
Potassium: 4.9 mmol/L (ref 3.5–5.1)
Sodium: 136 mmol/L (ref 135–145)
TCO2: 27 mmol/L (ref 22–32)

## 2019-07-02 LAB — GLUCOSE, CAPILLARY: Glucose-Capillary: 91 mg/dL (ref 70–99)

## 2019-07-02 LAB — APTT: aPTT: 27 seconds (ref 24–36)

## 2019-07-02 LAB — SARS CORONAVIRUS 2 (TAT 6-24 HRS): SARS Coronavirus 2: NEGATIVE

## 2019-07-02 LAB — TSH: TSH: 2.574 u[IU]/mL (ref 0.350–4.500)

## 2019-07-02 MED ORDER — ACETAMINOPHEN 160 MG/5ML PO SOLN: 650.0000 mg | ORAL | Status: DC | PRN

## 2019-07-02 MED ORDER — HYDRALAZINE HCL 20 MG/ML IJ SOLN: 5.0000 mg | INTRAMUSCULAR | Status: DC | PRN

## 2019-07-02 MED ORDER — ENOXAPARIN SODIUM 40 MG/0.4ML ~~LOC~~ SOLN
40.0000 mg | Freq: Every day | SUBCUTANEOUS | Status: DC
  Administered 2019-07-02 – 2019-07-03 (×2): 40 mg via SUBCUTANEOUS
  Filled 2019-07-02 (×2): qty 0.4

## 2019-07-02 MED ORDER — CLOPIDOGREL BISULFATE 75 MG PO TABS
75.0000 mg | ORAL_TABLET | Freq: Every day | ORAL | Status: DC
  Administered 2019-07-02 – 2019-07-03 (×2): 75 mg via ORAL
  Filled 2019-07-02 (×2): qty 1

## 2019-07-02 MED ORDER — PROPYLENE GLYCOL 0.6 % OP SOLN: 1.0000 [drp] | Freq: Every day | OPHTHALMIC | Status: DC | PRN

## 2019-07-02 MED ORDER — SODIUM CHLORIDE 0.9 % IV SOLN: INTRAVENOUS | Status: DC

## 2019-07-02 MED ORDER — TAMSULOSIN HCL 0.4 MG PO CAPS
0.4000 mg | ORAL_CAPSULE | Freq: Every day | ORAL | Status: DC
  Administered 2019-07-02: 0.4 mg via ORAL
  Filled 2019-07-02: qty 1

## 2019-07-02 MED ORDER — INSULIN ASPART 100 UNIT/ML ~~LOC~~ SOLN
0.0000 [IU] | Freq: Three times a day (TID) | SUBCUTANEOUS | Status: DC
  Administered 2019-07-03: 3 [IU] via SUBCUTANEOUS

## 2019-07-02 MED ORDER — SODIUM CHLORIDE 0.9% FLUSH: 3.0000 mL | Freq: Once | INTRAVENOUS | Status: DC

## 2019-07-02 MED ORDER — TIZANIDINE HCL 4 MG PO TABS: 2.0000 mg | ORAL_TABLET | Freq: Three times a day (TID) | ORAL | Status: DC | PRN

## 2019-07-02 MED ORDER — ACETAMINOPHEN 325 MG PO TABS
650.0000 mg | ORAL_TABLET | ORAL | Status: DC | PRN
  Administered 2019-07-03: 650 mg via ORAL
  Filled 2019-07-02: qty 2

## 2019-07-02 MED ORDER — STROKE: EARLY STAGES OF RECOVERY BOOK
Freq: Once | Status: AC
  Filled 2019-07-02: qty 1

## 2019-07-02 MED ORDER — INSULIN GLARGINE 100 UNIT/ML ~~LOC~~ SOLN
34.0000 [IU] | Freq: Every day | SUBCUTANEOUS | Status: DC
  Administered 2019-07-02: 34 [IU] via SUBCUTANEOUS
  Filled 2019-07-02 (×2): qty 0.34

## 2019-07-02 MED ORDER — SENNOSIDES-DOCUSATE SODIUM 8.6-50 MG PO TABS: 1.0000 | ORAL_TABLET | Freq: Every evening | ORAL | Status: DC | PRN

## 2019-07-02 MED ORDER — ACETAMINOPHEN 650 MG RE SUPP: 650.0000 mg | RECTAL | Status: DC | PRN

## 2019-07-02 MED ORDER — IOHEXOL 350 MG/ML SOLN
50.0000 mL | Freq: Once | INTRAVENOUS | Status: AC | PRN
  Administered 2019-07-02: 50 mL via INTRAVENOUS

## 2019-07-02 MED ORDER — POLYVINYL ALCOHOL 1.4 % OP SOLN
1.0000 [drp] | OPHTHALMIC | Status: DC | PRN
  Filled 2019-07-02: qty 15

## 2019-07-02 MED ORDER — ROSUVASTATIN CALCIUM 5 MG PO TABS
10.0000 mg | ORAL_TABLET | Freq: Every day | ORAL | Status: DC
  Administered 2019-07-02 – 2019-07-03 (×2): 10 mg via ORAL
  Filled 2019-07-02 (×2): qty 2

## 2019-07-02 MED ORDER — INSULIN ASPART 100 UNIT/ML ~~LOC~~ SOLN: 0.0000 [IU] | Freq: Every day | SUBCUTANEOUS | Status: DC

## 2019-07-02 MED ORDER — IOHEXOL 350 MG/ML SOLN
75.0000 mL | Freq: Once | INTRAVENOUS | Status: AC | PRN
  Administered 2019-07-02: 11:00:00 75 mL via INTRAVENOUS

## 2019-07-02 NOTE — Code Documentation (Signed)
Stroke Response Nurse Documentation Code Documentation  Paul [name] is a 74 y.o. male arriving to [place]. [facility] ED via [facility] on 07/02/19 with past medical hx of dysrhythmia with ACID (previously removed), HTN, CHF, sleep apnea, and diabetes. Code stroke was activated by EMS. Patient from home where he was LKW 07/01/19 at 2030. He woke up this morning and fell while putting on his pants. Wife noted patient was abnormal and called EMS.Patient reports was at work yesterday when coworkers reported he was displaying slurred speech and he noted difficulty typing with left hand - He reported that the symptoms resolved after one hour. Patient is a physician at occupation health. Patient takes daily aspirin. Stroke team at the bedside on patient arrival. Labs drawn and patient cleared for CT by EDP. CBG 144. Patient to CT with team. NIHSS 1, see documentation for details and code stroke times. Patient with dysarthria  and left hand weakness on exam. The following imaging was completed: CT. Patient is not a candidate for tPA due to out of the window. Care/Plan CTA and MRI; monitor Q2 VS and neuro checks x12 hrs; then Q4 hrs. Bedside handoff with ED RN Jenny Reichmann.    Leverne Humbles  Stroke Response RN

## 2019-07-02 NOTE — ED Notes (Signed)
ECHO in room for eval.

## 2019-07-02 NOTE — Progress Notes (Signed)
  Echocardiogram 2D Echocardiogram has been performed.  Paul [name] 07/02/2019, 3:51 PM

## 2019-07-02 NOTE — ED Triage Notes (Signed)
Pt had episode of left sided weakness yesterday with slurred speech about 1230  It resolved and  This am he woke up with same s/s with weakness left hand grasp, confused  About getting his clothes on cbg was 80 had  A fall getting on his pants this am , landed on floor , states did not bump head ,no neck or back pain, no thinners except asa

## 2019-07-02 NOTE — ED Notes (Signed)
ED TO INPATIENT HANDOFF REPORT  ED Nurse Name and Phone #: Celene Squibb RN  S Name/Age/Gender Paul [name] 74 y.o. male Room/Bed: 005C/005C  Code Status   Code Status: Full Code  Home/SNF/Other Home Patient oriented to: self, place, time and situation Is this baseline? Yes   Triage Complete: Triage complete  Chief Complaint CVA (cerebral vascular accident) ([place]) [I63.9] TIA (transient ischemic attack) [G45.9]  Triage Note Pt had episode of left sided weakness yesterday with slurred speech about 1230  It resolved and  This am he woke up with same s/s with weakness left hand grasp, confused  About getting his clothes on cbg was 80 had  A fall getting on his pants this am , landed on floor , states did not bump head ,no neck or back pain, no thinners except asa    Allergies Allergies  Allergen Reactions  . Lisinopril Swelling    angioedema    Level of Care/Admitting Diagnosis ED Disposition    ED Disposition Condition [facility] Area: [place] [[id number]]  Level of Care: Telemetry Medical [[id number]]  I expect the patient will be discharged within 24 hours: Yes  LOW acuity---Tx typically complete <24 hrs---ACUTE conditions typically can be evaluated <24 hours---LABS likely to return to acceptable levels <24 hours---IS near functional baseline---EXPECTED to return to current living arrangement---NOT newly hypoxic: Meets criteria for 5C-Observation unit  Covid Evaluation: Asymptomatic Screening Protocol (No Symptoms)  Diagnosis: TIA (transient ischemic attackTT:[id number]  Admitting Physician: Karmen Bongo [[id number]]  Attending Physician: Karmen Bongo [[id number]]       B Medical/Surgery History Past Medical History:  Diagnosis Date  . Aftercare following removal/replacement defibrillator    a. defibrillator removed 03/2019.  Marland Kitchen Arthritis    "thumbs, neck, knees" (06/01/2013)  . Cardiomyopathy, dilated, nonischemic ([place]) 10/20/2012  .  Cataract   . Chronic systolic heart failure ([place])   . CKD (chronic kidney disease), stage III   . Coronary artery disease    stent placed  . DVT (deep vein thrombosis) in pregnancy   . 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  . Erectile dysfunction   . GERD (gastroesophageal reflux disease)   . Hearing aid worn    B/L  . Hypertension   . Hypogonadism male   . Hypovitaminosis D   . 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 NICM (nonischemic cardiomyopathy) ([place])   . Sleep apnea    cpap  . Type II diabetes mellitus ([place])   . Ventricular tachycardia ([place])    observed during stress testing, subsequent VT on EPS  . Wears glasses    Past Surgical History:  Procedure Laterality Date  . ABLATION  06-01-2013   PVC's ablated along basal inferoseptal RV by Dr Rayann Heman  . APPENDECTOMY  1969  . CARDIAC CATHETERIZATION  04/2010  . CARDIAC DEFIBRILLATOR PLACEMENT  05/09/2010   MDT ICD implanted in [place] by Dr Ky Barban  . CATARACT EXTRACTION W/ INTRAOCULAR LENS  IMPLANT, BILATERAL Bilateral   . ICD LEAD REMOVAL N/A 03/19/2019   Procedure: ICD LEAD AND GENERATOR REMOVAL;  Surgeon: Evans Lance, MD;  Location: [facility];  Service: Cardiovascular;  Laterality: N/A;  . KNEE ARTHROSCOPY Left 1984  . LUMBAR LAMINECTOMY  ~ 2004  . TONSILLECTOMY AND ADENOIDECTOMY  1972  . TOTAL KNEE ARTHROPLASTY Left 04/28/2017  . TOTAL KNEE ARTHROPLASTY Left 04/28/2017   Procedure: TOTAL KNEE ARTHROPLASTY LEFT;  Surgeon: Dorna Leitz,  MD;  Location: [facility];  Service: Orthopedics;  Laterality: Left;  . TOTAL KNEE ARTHROPLASTY Right 05/07/2019   Procedure: TOTAL KNEE ARTHROPLASTY;  Surgeon: Dorna Leitz, MD;  Location: [facility];  Service: Orthopedics;  Laterality: Right;  . V-TACH ABLATION N/A 06/01/2013   Procedure: V-TACH ABLATION;  Surgeon: Coralyn Mark, MD;  Location: [facility];  Service: Cardiovascular;  Laterality: N/A;  . VASECTOMY    . VENTRICULAR  ABLATION SURGERY  06/01/2013     A IV Location/Drains/Wounds Patient Lines/Drains/Airways Status   Active Line/Drains/Airways    Name:   Placement date:   Placement time:   Site:   Days:   Peripheral IV 07/02/19 Left Antecubital   07/02/19    0856    Antecubital   less than 1   Incision (Closed) 03/19/19 Chest Left   03/19/19    1743     105   Incision (Closed) 05/07/19 Knee Right   05/07/19    0858     56          Intake/Output Last 24 hours No intake or output data in the 24 hours ending 07/02/19 1748  Labs/Imaging Results for orders placed or performed during the hospital encounter of 07/02/19 (from the past 48 hour(s))  CBG monitoring, ED     Status: Abnormal   Collection Time: 07/02/19  8:33 AM  Result Value Ref Range   Glucose-Capillary 144 (H) 70 - 99 mg/dL   Comment 1 Notify RN    Comment 2 Document in Chart   Protime-INR     Status: None   Collection Time: 07/02/19  8:38 AM  Result Value Ref Range   Prothrombin Time 13.5 11.4 - 15.2 seconds   INR 1.0 0.8 - 1.2    Comment: (NOTE) INR goal varies based on device and disease states. Performed at [facility], [place] [address] [place], [place] [address]   APTT     Status: None   Collection Time: 07/02/19  8:38 AM  Result Value Ref Range   aPTT 27 24 - 36 seconds    Comment: Performed at [facility] [address] [place], [place] [address]  CBC     Status: Abnormal   Collection Time: 07/02/19  8:38 AM  Result Value Ref Range   WBC 11.4 (H) 4.0 - 10.5 K/uL   RBC 4.32 4.22 - 5.81 MIL/uL   Hemoglobin 13.0 13.0 - 17.0 g/dL   HCT 41.0 39.0 - 52.0 %   MCV 94.9 80.0 - 100.0 fL   MCH 30.1 26.0 - 34.0 pg   MCHC 31.7 30.0 - 36.0 g/dL   RDW 14.1 11.5 - 15.5 %   Platelets 214 150 - 400 K/uL   nRBC 0.0 0.0 - 0.2 %    Comment: Performed at [facility], [place] [address] [place], [place] [address]  Differential     Status: Abnormal   Collection Time: 07/02/19  8:38 AM  Result Value Ref Range    Neutrophils Relative % 77 %   Neutro Abs 8.8 (H) 1.7 - 7.7 K/uL   Lymphocytes Relative 14 %   Lymphs Abs 1.6 0.7 - 4.0 K/uL   Monocytes Relative 5 %   Monocytes Absolute 0.6 0.1 - 1.0 K/uL   Eosinophils Relative 3 %   Eosinophils Absolute 0.3 0.0 - 0.5 K/uL   Basophils Relative 1 %   Basophils Absolute 0.1 0.0 - 0.1 K/uL   Immature Granulocytes 0 %  Abs Immature Granulocytes 0.03 0.00 - 0.07 K/uL    Comment: Performed at [facility], [place] [address] [place], [place] [address]  Comprehensive metabolic panel     Status: Abnormal   Collection Time: 07/02/19  8:38 AM  Result Value Ref Range   Sodium 138 135 - 145 mmol/L   Potassium 5.1 3.5 - 5.1 mmol/L   Chloride 102 98 - 111 mmol/L   CO2 25 22 - 32 mmol/L   Glucose, Bld 153 (H) 70 - 99 mg/dL   BUN 26 (H) 8 - 23 mg/dL   Creatinine, Ser 1.55 (H) 0.61 - 1.24 mg/dL   Calcium 9.1 8.9 - 10.3 mg/dL   Total Protein 6.0 (L) 6.5 - 8.1 g/dL   Albumin 3.7 3.5 - 5.0 g/dL   AST 18 15 - 41 U/L   ALT 19 0 - 44 U/L   Alkaline Phosphatase 74 38 - 126 U/L   Total Bilirubin 0.9 0.3 - 1.2 mg/dL   GFR calc non Af Amer 44 (L) >60 mL/min   GFR calc Af Amer 51 (L) >60 mL/min   Anion gap 11 5 - 15    Comment: Performed at [facility] [address], [place], [place] [address]  I-stat chem 8, ED     Status: Abnormal   Collection Time: 07/02/19  8:40 AM  Result Value Ref Range   Sodium 136 135 - 145 mmol/L   Potassium 4.9 3.5 - 5.1 mmol/L   Chloride 100 98 - 111 mmol/L   BUN 28 (H) 8 - 23 mg/dL   Creatinine, Ser 1.50 (H) 0.61 - 1.24 mg/dL   Glucose, Bld 147 (H) 70 - 99 mg/dL   Calcium, Ion 1.11 (L) 1.15 - 1.40 mmol/L   TCO2 27 22 - 32 mmol/L   Hemoglobin 13.6 13.0 - 17.0 g/dL   HCT 40.0 39.0 - 52.0 %  SARS CORONAVIRUS 2 (TAT 6-24 HRS) Nasopharyngeal Nasopharyngeal Swab     Status: None   Collection Time: 07/02/19  9:06 AM   Specimen: Nasopharyngeal Swab  Result Value Ref Range   SARS Coronavirus 2 NEGATIVE NEGATIVE    Comment:  (NOTE) SARS-CoV-2 target nucleic acids are NOT DETECTED. The SARS-CoV-2 RNA is generally detectable in upper and lower respiratory specimens during the acute phase of infection. Negative results do not preclude SARS-CoV-2 infection, do not rule out co-infections with other pathogens, and should not be used as the sole basis for treatment or other patient management decisions. Negative results must be combined with clinical observations, patient history, and epidemiological information. The expected result is Negative. Fact Sheet for Patients: SugarRoll.be Fact Sheet for Healthcare Providers: https://www.woods-mathews.com/ This test is not yet approved or cleared by the [place] FDA and  has been authorized for detection and/or diagnosis of SARS-CoV-2 by FDA under an Emergency Use Authorization (EUA). This EUA will remain  in effect (meaning this test can be used) for the duration of the COVID-19 declaration under Section 56 4(b)(1) of the Act, 21 U.S.C. section 360bbb-3(b)(1), unless the authorization is terminated or revoked sooner. Performed at [facility], [place] [address], [place], [place] [address]   CBG monitoring, ED     Status: None   Collection Time: 07/02/19  4:56 PM  Result Value Ref Range   Glucose-Capillary 72 70 - 99 mg/dL   CT Code Stroke CTA Head W/WO contrast  Result Date: 07/02/2019 CLINICAL DATA:  Left-sided weakness yesterday with slurred speech. EXAM: CT ANGIOGRAPHY HEAD  AND NECK TECHNIQUE: Multidetector CT imaging of the head and neck was performed using the standard protocol during bolus administration of intravenous contrast. Multiplanar CT image reconstructions and MIPs were obtained to evaluate the vascular anatomy. Carotid stenosis measurements (when applicable) are obtained utilizing NASCET criteria, using the distal internal carotid diameter as the denominator. CONTRAST:  26mL OMNIPAQUE IOHEXOL 350 MG/ML  SOLN COMPARISON:  Head CT from earlier today FINDINGS: CT HEAD FINDINGS Brain: Streak artifact along the inner table which limits assessment of cortex for detecting subtle infarcts. No convincing infarct. No hemorrhage, hydrocephalus, or masslike finding Vascular: Arterial findings below Skull: Negative Sinuses: Negative Orbits: Bilateral cataract resection Review of the MIP images confirms the above findings CTA NECK FINDINGS Aortic arch: Atherosclerotic plaque.  Four vessel branching. Right carotid system: Calcified bulb plaque with no significant stenosis and no ulceration. Left carotid system: Calcified bulb plaque and mild common carotid wall thickening. No flow limiting stenosis or ulceration. Vertebral arteries: The left vertebral artery arises from the arch. Both vertebral arteries are smooth and widely patent to the dura. No proximal subclavian stenosis on the right. Skeleton: Advanced lower cervical and mid cervical disc narrowing with ridging and limited posterior longitudinal ligament ossification (C5 and C6). Degenerative facet spurring is also prominent and diffuse. Other neck: Chronic right maxillary sinusitis Upper chest: Negative Review of the MIP images confirms the above findings CTA HEAD FINDINGS Anterior circulation: Atherosclerotic calcification along the carotid siphons. No flow limiting stenosis. No branch occlusion or aneurysm. Posterior circulation: Moderate atheromatous narrowing of the left V4 segment. Atherosclerotic irregularity of the distal basilar without high-grade narrowing. There is a fetal type left PCA. No flow seen beyond the right P1 segment with tiny collaterals apparently reconstituting reconstitution of right occipital branches. Venous sinuses: Negative Anatomic variants: As above Review of the MIP images confirms the above findings These results were communicated to Dr. Cheral Marker at 11:27 amon 1/15/2021by text page via the [facility] messaging system. IMPRESSION: 1. 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 Acuity is indeterminate. 2. Moderate narrowing of the left V4 segment. 3. Atherosclerosis without significant stenosis in the neck or anterior circulation. Electronically Signed   By: Monte Fantasia M.D.   On: 07/02/2019 11:29   CT Code Stroke CTA Neck W/WO contrast  Result Date: 07/02/2019 CLINICAL DATA:  Left-sided weakness yesterday with slurred speech. EXAM: CT ANGIOGRAPHY HEAD AND NECK TECHNIQUE: Multidetector CT imaging of the head and neck was performed using the standard protocol during bolus administration of intravenous contrast. Multiplanar CT image reconstructions and MIPs were obtained to evaluate the vascular anatomy. Carotid stenosis measurements (when applicable) are obtained utilizing NASCET criteria, using the distal internal carotid diameter as the denominator. CONTRAST:  24mL OMNIPAQUE IOHEXOL 350 MG/ML SOLN COMPARISON:  Head CT from earlier today FINDINGS: CT HEAD FINDINGS Brain: Streak artifact along the inner table which limits assessment of cortex for detecting subtle infarcts. No convincing infarct. No hemorrhage, hydrocephalus, or masslike finding Vascular: Arterial findings below Skull: Negative Sinuses: Negative Orbits: Bilateral cataract resection Review of the MIP images confirms the above findings CTA NECK FINDINGS Aortic arch: Atherosclerotic plaque.  Four vessel branching. Right carotid system: Calcified bulb plaque with no significant stenosis and no ulceration. Left carotid system: Calcified bulb plaque and mild common carotid wall thickening. No flow limiting stenosis or ulceration. Vertebral arteries: The left vertebral artery arises from the arch. Both vertebral arteries are smooth and widely patent to the dura. No proximal subclavian  stenosis on the right. Skeleton: Advanced lower cervical and mid cervical disc narrowing with ridging and limited posterior longitudinal ligament  ossification (C5 and C6). Degenerative facet spurring is also prominent and diffuse. Other neck: Chronic right maxillary sinusitis Upper chest: Negative Review of the MIP images confirms the above findings CTA HEAD FINDINGS Anterior circulation: Atherosclerotic calcification along the carotid siphons. No flow limiting stenosis. No branch occlusion or aneurysm. Posterior circulation: Moderate atheromatous narrowing of the left V4 segment. Atherosclerotic irregularity of the distal basilar without high-grade narrowing. There is a fetal type left PCA. No flow seen beyond the right P1 segment with tiny collaterals apparently reconstituting reconstitution of right occipital branches. Venous sinuses: Negative Anatomic variants: As above Review of the MIP images confirms the above findings These results were communicated to Dr. Cheral Marker at 11:27 amon 1/15/2021by text page via the [facility] messaging system. IMPRESSION: 1. 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 Acuity is indeterminate. 2. Moderate narrowing of the left V4 segment. 3. Atherosclerosis without significant stenosis in the neck or anterior circulation. Electronically Signed   By: Monte Fantasia M.D.   On: 07/02/2019 11:29   MR BRAIN WO CONTRAST  Result Date: 07/02/2019 CLINICAL DATA:  Stroke.  Left-sided weakness EXAM: MRI HEAD WITHOUT CONTRAST TECHNIQUE: Multiplanar, multiecho pulse sequences of the brain and surrounding structures were obtained without intravenous contrast. COMPARISON:  CTA head 07/02/2019 FINDINGS: Brain: Negative for acute infarct. Mild chronic microvascular ischemic changes in the white matter. Prominent cisterna magna. Negative for hemorrhage or mass. No ventricular enlargement or midline shift. Vascular: Normal arterial flow voids Skull and upper cervical spine: No focal skeletal lesions. Sinuses/Orbits: Mucosal edema and air-fluid level right maxillary sinus. Coil edema ethmoid  sinuses. Bilateral cataract surgery Other: None IMPRESSION: Negative for acute infarct. Mild chronic microvascular ischemic change in the white matter. Electronically Signed   By: Franchot Gallo M.D.   On: 07/02/2019 14:32   CT CEREBRAL PERFUSION W CONTRAST  Result Date: 07/02/2019 CLINICAL DATA:  Right P1 occlusion on CTA today. Left-sided weakness and slurred speech EXAM: CT PERFUSION BRAIN TECHNIQUE: Multiphase CT imaging of the brain was performed following IV bolus contrast injection. Subsequent parametric perfusion maps were calculated using RAPID software. CONTRAST:  3mL OMNIPAQUE IOHEXOL 350 MG/ML SOLN COMPARISON:  CT head and CTA 07/02/2019 FINDINGS: CT Brain Perfusion Findings: CBF (<30%) Volume: 67mL Perfusion (Tmax>6.0s) volume: 55mL Mismatch Volume: 57mL ASPECTS on noncontrast CT Head: 10 at 0842 hours today. Infarct Core: 0 mL Infarction Location:None IMPRESSION: Negative CT perfusion.  No evidence of infarct or acute ischemia. Electronically Signed   By: Franchot Gallo M.D.   On: 07/02/2019 12:40   CT HEAD CODE STROKE WO CONTRAST  Result Date: 07/02/2019 CLINICAL DATA:  Code stroke. Neuro deficit, acute, stroke suspected. Additional history provided: Last known well 20:30. Left-sided deficits and confusion. EXAM: CT HEAD WITHOUT CONTRAST TECHNIQUE: Contiguous axial images were obtained from the base of the skull through the vertex without intravenous contrast. COMPARISON:  No pertinent prior studies available for comparison. FINDINGS: Brain: No evidence of acute intracranial hemorrhage. No demarcated cortical infarction. No evidence of intracranial mass. No midline shift or extra-axial fluid collection. Mild ill-defined hypoattenuation within the cerebral white matter is nonspecific, but consistent with chronic small vessel ischemic disease. Small symmetric focal hypodensities within the inferior basal ganglia likely reflect prominent perivascular spaces. Mild generalized parenchymal atrophy.  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 Vascular: No hyperdense vessel.  Atherosclerotic calcifications. Skull: Normal. Negative for fracture or focal lesion. Sinuses/Orbits: Visualized orbits demonstrate no acute abnormality. Mild ethmoid sinus mucosal thickening. Moderate right maxillary sinus mucosal thickening. No significant mastoid effusion. These results were communicated to Dr. Cheral Marker at 8:53 amon 1/15/2021by text page via the [facility] messaging system. IMPRESSION: No evidence of acute intracranial abnormality. Mild generalized parenchymal atrophy and chronic small vessel ischemic disease. Paranasal sinus disease as described. Electronically Signed   By: Kellie Simmering DO   On: 07/02/2019 08:54    Pending Labs Unresulted Labs (From admission, onward)    Start     Ordered   07/03/19 0500  Lipid panel  Tomorrow morning,   R    Comments: Fasting    07/02/19 1232   07/02/19 1623  TSH  Once,   STAT     07/02/19 1630   07/02/19 1229  Urine rapid drug screen (hosp performed)not at [facility]  Once,   STAT     07/02/19 1232          Vitals/Pain Today's Vitals   07/02/19 1630 07/02/19 1645 07/02/19 1715 07/02/19 1730  BP: (!) 177/103 (!) 187/100 (!) 167/116 (!) 191/178  Pulse: 75 73 86 67  Resp: 13 11 (!) 22 12  Temp:      TempSrc:      SpO2: 97% 96% 97% 99%  PainSc:        Isolation Precautions No active isolations  Medications Medications  rosuvastatin (CRESTOR) tablet 10 mg (10 mg Oral Given 07/02/19 1548)  Insulin Glargine (LANTUS) Solostar Pen 34 Units (has no administration in time range)  tamsulosin (FLOMAX) capsule 0.4 mg (has no administration in time range)  tiZANidine (ZANAFLEX) tablet 2 mg (has no administration in time range)  insulin aspart (novoLOG) injection 0-15 Units (0 Units Subcutaneous Not Given 07/02/19 1738)  enoxaparin (LOVENOX) injection 40 mg (40 mg Subcutaneous Given 07/02/19 1549)  0.9 %  sodium chloride infusion (has no  administration in time range)  acetaminophen (TYLENOL) tablet 650 mg (has no administration in time range)    Or  acetaminophen (TYLENOL) 160 MG/5ML solution 650 mg (has no administration in time range)    Or  acetaminophen (TYLENOL) suppository 650 mg (has no administration in time range)  senna-docusate (Senokot-S) tablet 1 tablet (has no administration in time range)  insulin aspart (novoLOG) injection 0-5 Units (has no administration in time range)  clopidogrel (PLAVIX) tablet 75 mg (75 mg Oral Given 07/02/19 1548)  polyvinyl alcohol (LIQUIFILM TEARS) 1.4 % ophthalmic solution 1 drop (has no administration in time range)  hydrALAZINE (APRESOLINE) injection 5 mg (has no administration in time range)  iohexol (OMNIPAQUE) 350 MG/ML injection 75 mL (75 mLs Intravenous Contrast Given 07/02/19 1051)   stroke: mapping our early stages of recovery book ( Does not apply Given 07/02/19 1738)  iohexol (OMNIPAQUE) 350 MG/ML injection 50 mL (50 mLs Intravenous Contrast Given 07/02/19 1234)    Mobility walks High fall risk   Focused Assessments Neuro Assessment Handoff:  Swallow screen pass? Yes    NIH Stroke Scale ( + Modified Stroke Scale Criteria)  Interval: Initial Level of Consciousness (1a.)   : Alert, keenly responsive LOC Questions (1b. )   +: Answers both questions correctly LOC Commands (1c. )   + : Performs both tasks correctly Best Gaze (2. )  +: Normal Visual (3. )  +: Partial hemianopia Facial Palsy (4. )    : Normal symmetrical movements Motor Arm, Left (5a. )   +: No  drift Motor Arm, Right (5b. )   +: No drift Motor Leg, Left (6a. )   +: No drift Motor Leg, Right (6b. )   +: No drift Limb Ataxia (7. ): Absent Sensory (8. )   +: Normal, no sensory loss Best Language (9. )   +: No aphasia Dysarthria (10. ): Mild-to-moderate dysarthria, patient slurs at least some words and, at worst, can be understood with some difficulty Extinction/Inattention (11.)   +: No  Abnormality Modified SS Total  +: 1 Complete NIHSS TOTAL: 1 Last date known well: 07/01/19 Last time known well: 2030 Neuro Assessment:   Neuro Checks:   Initial (07/02/19 0900)  Last Documented NIHSS Modified Score: 1 (07/02/19 1658) Has TPA been given? No If patient is a Neuro Trauma and patient is going to OR before floor call report to [place] nurse: [phone] or [phone]     R Recommendations: See Admitting Provider Note  Report given to:   Additional Notes:

## 2019-07-02 NOTE — ED Notes (Signed)
Patient transported to CT 

## 2019-07-02 NOTE — ED Notes (Addendum)
Pt to CT scan for perfusion studies.  Remains alert and oriented with some noted confusion regarding diagnosis.  Able to move (I) from stretcher to CT table.

## 2019-07-02 NOTE — Care Management Obs Status (Signed)
MEDICARE OBSERVATION STATUS NOTIFICATION   Patient Details  Name: Paul [name] MRN: [id number] Date of Birth: 05/25/1946   Medicare Observation Status Notification Given:  Yes    Leyla Soliz, LCSW 07/02/2019, 4:42 PM

## 2019-07-02 NOTE — H&P (Signed)
History and Physical    Paul [name] MBE:[id number] DOB: February 24, 1946 DOA: 07/02/2019  PCP: Patient, No Pcp Per Consultants:  Berenice Primas - orthopedics; [facility] - endocrinology; Lovena Le - EP; Aundra Dubin - cardiology Patient coming from:  Home - lives with wife; NOK: Wife, Elric Tirado, [phone]  Chief Complaint: Left-sided weakness  HPI: Paul [name] is a 74 y.o. male with medical history significant of DM; OSA; chronic systolic CHF with AICD placement; hypogonadism; HTN; and stage 3 CKD presenting with left-sided weakness.  Yesterday at work he had difficulty with motor in his left hand, unable to type in the EMR.  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 It started around noon and resolved by the time he got home it seemed resolved.  His wife was concerned about it this AM and thought he needed to come for evaluation and called 911.  She again thought his speech was not quite right.  He is not noticing difficulty.  No dysphagia.  He had a TKR on the right about 6 weeks ago.   ED Course:  Occ Med physician, started with left-sided weakness and mild confusion since yesterday.  Slurred speech was transient and resolved.  Coworkers and wife voiced concerns.  He fell this AM.  R insular capsule CVA.  No grip strength on the left.  Neurology will consult, CTA pending.  Review of Systems: As per HPI; otherwise review of systems reviewed and negative.   Ambulatory Status:   Ambulates without assistance  Past Medical History:  Diagnosis Date  . Aftercare following removal/replacement defibrillator    a. defibrillator removed 03/2019.  Marland Kitchen Arthritis    "thumbs, neck, knees" (06/01/2013)  . Cardiomyopathy, dilated, nonischemic ([place]) 10/20/2012  . Cataract   . Chronic systolic heart failure ([place])   . CKD (chronic kidney disease), stage III   . Coronary artery disease    stent placed  . DVT (deep vein thrombosis) in pregnancy   . 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  . Erectile dysfunction   . GERD (gastroesophageal reflux disease)   . Hearing aid worn    B/L  . Hypertension   . Hypogonadism male   . Hypovitaminosis D   . 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 NICM (nonischemic cardiomyopathy) ([place])   . Sleep apnea    cpap  . Type II diabetes mellitus ([place])   . Ventricular tachycardia ([place])    observed during stress testing, subsequent VT on EPS  . Wears glasses     Past Surgical History:  Procedure Laterality Date  . ABLATION  06-01-2013   PVC's ablated along basal inferoseptal RV by Dr Rayann Heman  . APPENDECTOMY  1969  . CARDIAC CATHETERIZATION  04/2010  . CARDIAC DEFIBRILLATOR PLACEMENT  05/09/2010   MDT ICD implanted in [place] by Dr Ky Barban  . CATARACT EXTRACTION W/ INTRAOCULAR LENS  IMPLANT, BILATERAL Bilateral   . ICD LEAD REMOVAL N/A 03/19/2019   Procedure: ICD LEAD AND GENERATOR REMOVAL;  Surgeon: Evans Lance, MD;  Location: [place];  Service: Cardiovascular;  Laterality: N/A;  . KNEE ARTHROSCOPY Left 1984  . LUMBAR LAMINECTOMY  ~ 2004  . TONSILLECTOMY AND ADENOIDECTOMY  1972  . TOTAL KNEE ARTHROPLASTY Left 04/28/2017  . TOTAL KNEE ARTHROPLASTY Left 04/28/2017   Procedure: TOTAL KNEE ARTHROPLASTY LEFT;  Surgeon: Dorna Leitz, MD;  Location: [place];  Service: Orthopedics;  Laterality: Left;  . TOTAL  KNEE ARTHROPLASTY Right 05/07/2019   Procedure: TOTAL KNEE ARTHROPLASTY;  Surgeon: Dorna Leitz, MD;  Location: [facility];  Service: Orthopedics;  Laterality: Right;  . V-TACH ABLATION N/A 06/01/2013   Procedure: V-TACH ABLATION;  Surgeon: Coralyn Mark, MD;  Location: [place];  Service: Cardiovascular;  Laterality: N/A;  . VASECTOMY    . VENTRICULAR ABLATION SURGERY  06/01/2013    Social History   Socioeconomic History  . Marital status: Married    Spouse name: Not on file  . Number of children: Not on file  . Years of education: Not on file    . Highest education level: Not on file  Occupational History  . Occupation: Occupational Medicine Physician  Tobacco Use  . Smoking status: Never Smoker  . Smokeless tobacco: Never Used  Substance and Sexual Activity  . Alcohol use: Yes    Alcohol/week: 2.0 standard drinks    Types: 1 Glasses of wine, 1 Cans of beer per week    Comment: social  . Drug use: No  . Sexual activity: Yes  Other Topics Concern  . 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    Moved here from [facility] 1.5 years ago   Social Determinants of Health   Financial Resource Strain:   . Difficulty of Paying Living Expenses: Not on file  Food Insecurity:   . Worried About Charity fundraiser in the Last Year: Not on file  . Ran Out of Food in the Last Year: Not on file  Transportation Needs:   . Lack of Transportation (Medical): Not on file  . Lack of Transportation (Non-Medical): Not on file  Physical Activity:   . Days of Exercise per Week: Not on file  . Minutes of Exercise per Session: Not on file  Stress:   . Feeling of Stress : Not on file  Social Connections:   . Frequency of Communication with Friends and Family: Not on file  . Frequency of Social Gatherings with Friends and Family: Not on file  . Attends Religious Services: Not on file  . Active Member of Clubs or Organizations: Not on file  . Attends Archivist Meetings: Not on file  . Marital Status: Not on file  Intimate Partner Violence:   . Fear of Current or Ex-Partner: Not on file  . Emotionally Abused: Not on file  . Physically Abused: Not on file  . Sexually Abused: Not on file    Allergies  Allergen Reactions  . Lisinopril Swelling    angioedema    Family History  Problem Relation Age of Onset  . Alcohol abuse Mother   . COPD Mother   . Depression Mother   . Heart disease Mother   . Hypertension Mother   . Stroke  Mother   . COPD Father   . Heart disease Father   . Hypertension Father   . Stroke Father   . Cancer Father   . Diabetes Sister   . Mental retardation Sister   . Learning disabilities Sister   . Hypertension Sister   . Alcohol abuse Brother   . Drug abuse Brother   . Heart disease Brother   . Hypertension Brother   . Coronary artery disease Brother     Prior to Admission medications   Medication Sig Start Date End Date Taking? Authorizing Provider  acetaminophen (TYLENOL) 500 MG tablet Take 1,000  mg by mouth every 6 (six) hours as needed for moderate pain or headache.    [provider]  aspirin EC 325 MG tablet Take 1 tablet (325 mg total) by mouth 2 (two) times daily after a meal. Take x 1 month post op to decrease risk of blood clots. 05/07/19   Gary Fleet, PA-C  Blood Glucose Monitoring Suppl (ONETOUCH VERIO) w/Device KIT Use to check blood sugar 3 times a day. 10/07/18   Philemon Kingdom, MD  carvedilol (COREG) 6.25 MG tablet TAKE 1&1/2 TABLETS BY MOUTH TWO TIMES DAILY 05/07/19   Larey Dresser, MD  docusate sodium (COLACE) 100 MG capsule Take 1 capsule (100 mg total) by mouth 2 (two) times daily. 05/07/19   Gary Fleet, PA-C  empagliflozin (JARDIANCE) 10 MG TABS tablet Take 10 mg by mouth daily. 09/21/18   Philemon Kingdom, MD  glucose blood (ONETOUCH VERIO) test strip Use to check blood sugar 3 times a day. 10/07/18   Philemon Kingdom, MD  Insulin Glargine (LANTUS SOLOSTAR) 100 UNIT/ML Solostar Pen Inject 38 Units into the skin at bedtime. 09/02/17   Philemon Kingdom, MD  Lancets MISC Glucose monitor to check blood sugars tid dx code E11.9 brand per insurance coverage 07/23/17   Philemon Kingdom, MD  losartan (COZAAR) 25 MG tablet Take 1 tablet (25 mg total) by mouth daily. 02/24/19 05/25/19  Larey Dresser, MD  metFORMIN (GLUCOPHAGE-XR) 500 MG 24 hr tablet Take 4 tablets (2,000 mg total) by mouth daily with breakfast. Patient taking differently: Take 2,000 mg by  mouth every evening.  01/05/19   Philemon Kingdom, MD  oxyCODONE-acetaminophen (PERCOCET/ROXICET) 5-325 MG tablet Take 1-2 tablets by mouth every 6 (six) hours as needed for severe pain. 05/07/19   Gary Fleet, PA-C  rosuvastatin (CRESTOR) 10 MG tablet TAKE 1 TABLET (10 MG TOTAL) BY MOUTH DAILY. 04/22/19   Larey Dresser, MD  spironolactone (ALDACTONE) 25 MG tablet TAKE 1 TABLET BY MOUTH ONCE A DAY 05/07/19   Larey Dresser, MD  tamsulosin (FLOMAX) 0.4 MG CAPS capsule Take 0.4 mg by mouth at bedtime.     [provider]  tiZANidine (ZANAFLEX) 2 MG tablet Take 1 tablet (2 mg total) by mouth every 8 (eight) hours as needed for muscle spasms. 05/07/19   Gary Fleet, PA-C  TRULICITY 1.5 CV/8.9FY SOPN INJECT 1.5MG (1 PEN ) INTO THE SKIN ONCE A WEEK (REPLACE VICTOZA) Patient taking differently: Inject 1.5 mg into the skin once a week.  04/08/19   Philemon Kingdom, MD  UNIFINE PENTIPS 32G X 4 MM MISC USE TWICE DAILY WITH LANTUS AND VICTOZA 06/10/19   Philemon Kingdom, MD    Physical Exam: Vitals:   07/02/19 1130 07/02/19 1200 07/02/19 1257 07/02/19 1330  BP: (!) 165/108 (!) 165/107  (!) 174/81  Pulse: 86 96 86 71  Resp: 12  14   Temp:      TempSrc:      SpO2: 98% 97% 100% 99%     . General:  Appears calm and comfortable and is NAD . Eyes:  PERRL, EOMI, normal lids, iris . ENT:  grossly normal hearing, lips & tongue, mmm; appropriate dentition . Neck:  no LAD, masses or thyromegaly; no carotid bruits . Cardiovascular:  RRR, no m/r/g. No LE edema.  Marland Kitchen Respiratory:   CTA bilaterally with no wheezes/rales/rhonchi.  Normal respiratory effort. . Abdomen:  soft, NT, ND, NABS . Skin:  no rash or induration seen on limited exam . Musculoskeletal:  Diminished strength of LLE >  LUE, good ROM, no bony abnormality . Psychiatric:  Somewhat eccentric/cantankerous mood and affect, speech fluent and appropriate, AOx3 . Neurologic:  CN 2-12 grossly intact, moves all extremities in  coordinated fashion, sensation intact    Radiological Exams on Admission: CT Code Stroke CTA Head W/WO contrast  Result Date: 07/02/2019 CLINICAL DATA:  Left-sided weakness yesterday with slurred speech. EXAM: CT ANGIOGRAPHY HEAD AND NECK TECHNIQUE: Multidetector CT imaging of the head and neck was performed using the standard protocol during bolus administration of intravenous contrast. Multiplanar CT image reconstructions and MIPs were obtained to evaluate the vascular anatomy. Carotid stenosis measurements (when applicable) are obtained utilizing NASCET criteria, using the distal internal carotid diameter as the denominator. CONTRAST:  75m OMNIPAQUE IOHEXOL 350 MG/ML SOLN COMPARISON:  Head CT from earlier today FINDINGS: CT HEAD FINDINGS Brain: Streak artifact along the inner table which limits assessment of cortex for detecting subtle infarcts. No convincing infarct. No hemorrhage, hydrocephalus, or masslike finding Vascular: Arterial findings below Skull: Negative Sinuses: Negative Orbits: Bilateral cataract resection Review of the MIP images confirms the above findings CTA NECK FINDINGS Aortic arch: Atherosclerotic plaque.  Four vessel branching. Right carotid system: Calcified bulb plaque with no significant stenosis and no ulceration. Left carotid system: Calcified bulb plaque and mild common carotid wall thickening. No flow limiting stenosis or ulceration. Vertebral arteries: The left vertebral artery arises from the arch. Both vertebral arteries are smooth and widely patent to the dura. No proximal subclavian stenosis on the right. Skeleton: Advanced lower cervical and mid cervical disc narrowing with ridging and limited posterior longitudinal ligament ossification (C5 and C6). Degenerative facet spurring is also prominent and diffuse. Other neck: Chronic right maxillary sinusitis Upper chest: Negative Review of the MIP images confirms the above findings CTA HEAD FINDINGS Anterior circulation:  Atherosclerotic calcification along the carotid siphons. No flow limiting stenosis. No branch occlusion or aneurysm. Posterior circulation: Moderate atheromatous narrowing of the left V4 segment. Atherosclerotic irregularity of the distal basilar without high-grade narrowing. There is a fetal type left PCA. No flow seen beyond the right P1 segment with tiny collaterals apparently reconstituting reconstitution of right occipital branches. Venous sinuses: Negative Anatomic variants: As above Review of the MIP images confirms the above findings These results were communicated to Dr. LCheral Markerat 11:27 amon 1/15/2021by text page via the [facility]. IMPRESSION: 1. 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 Acuity is indeterminate. 2. Moderate narrowing of the left V4 segment. 3. Atherosclerosis without significant stenosis in the neck or anterior circulation. Electronically Signed   By: JMonte FantasiaM.D.   On: 07/02/2019 11:29   CT Code Stroke CTA Neck W/WO contrast  Result Date: 07/02/2019 CLINICAL DATA:  Left-sided weakness yesterday with slurred speech. EXAM: CT ANGIOGRAPHY HEAD AND NECK TECHNIQUE: Multidetector CT imaging of the head and neck was performed using the standard protocol during bolus administration of intravenous contrast. Multiplanar CT image reconstructions and MIPs were obtained to evaluate the vascular anatomy. Carotid stenosis measurements (when applicable) are obtained utilizing NASCET criteria, using the distal internal carotid diameter as the denominator. CONTRAST:  777mOMNIPAQUE IOHEXOL 350 MG/ML SOLN COMPARISON:  Head CT from earlier today FINDINGS: CT HEAD FINDINGS Brain: Streak artifact along the inner table which limits assessment of cortex for detecting subtle infarcts. No convincing infarct. No hemorrhage, hydrocephalus, or masslike finding Vascular: Arterial findings below Skull: Negative Sinuses: Negative Orbits:  Bilateral cataract resection Review of the MIP images confirms the above findings  CTA NECK FINDINGS Aortic arch: Atherosclerotic plaque.  Four vessel branching. Right carotid system: Calcified bulb plaque with no significant stenosis and no ulceration. Left carotid system: Calcified bulb plaque and mild common carotid wall thickening. No flow limiting stenosis or ulceration. Vertebral arteries: The left vertebral artery arises from the arch. Both vertebral arteries are smooth and widely patent to the dura. No proximal subclavian stenosis on the right. Skeleton: Advanced lower cervical and mid cervical disc narrowing with ridging and limited posterior longitudinal ligament ossification (C5 and C6). Degenerative facet spurring is also prominent and diffuse. Other neck: Chronic right maxillary sinusitis Upper chest: Negative Review of the MIP images confirms the above findings CTA HEAD FINDINGS Anterior circulation: Atherosclerotic calcification along the carotid siphons. No flow limiting stenosis. No branch occlusion or aneurysm. Posterior circulation: Moderate atheromatous narrowing of the left V4 segment. Atherosclerotic irregularity of the distal basilar without high-grade narrowing. There is a fetal type left PCA. No flow seen beyond the right P1 segment with tiny collaterals apparently reconstituting reconstitution of right occipital branches. Venous sinuses: Negative Anatomic variants: As above Review of the MIP images confirms the above findings These results were communicated to Dr. Cheral Marker at 11:27 amon 1/15/2021by text page via the [facility] messaging system. IMPRESSION: 1. 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 Acuity is indeterminate. 2. Moderate narrowing of the left V4 segment. 3. Atherosclerosis without significant stenosis in the neck or anterior circulation. Electronically Signed   By: Monte Fantasia M.D.   On: 07/02/2019 11:29   MR BRAIN WO  CONTRAST  Result Date: 07/02/2019 CLINICAL DATA:  Stroke.  Left-sided weakness EXAM: MRI HEAD WITHOUT CONTRAST TECHNIQUE: Multiplanar, multiecho pulse sequences of the brain and surrounding structures were obtained without intravenous contrast. COMPARISON:  CTA head 07/02/2019 FINDINGS: Brain: Negative for acute infarct. Mild chronic microvascular ischemic changes in the white matter. Prominent cisterna magna. Negative for hemorrhage or mass. No ventricular enlargement or midline shift. Vascular: Normal arterial flow voids Skull and upper cervical spine: No focal skeletal lesions. Sinuses/Orbits: Mucosal edema and air-fluid level right maxillary sinus. Coil edema ethmoid sinuses. Bilateral cataract surgery Other: None IMPRESSION: Negative for acute infarct. Mild chronic microvascular ischemic change in the white matter. Electronically Signed   By: Franchot Gallo M.D.   On: 07/02/2019 14:32   CT CEREBRAL PERFUSION W CONTRAST  Result Date: 07/02/2019 CLINICAL DATA:  Right P1 occlusion on CTA today. Left-sided weakness and slurred speech EXAM: CT PERFUSION BRAIN TECHNIQUE: Multiphase CT imaging of the brain was performed following IV bolus contrast injection. Subsequent parametric perfusion maps were calculated using RAPID software. CONTRAST:  84m OMNIPAQUE IOHEXOL 350 MG/ML SOLN COMPARISON:  CT head and CTA 07/02/2019 FINDINGS: CT Brain Perfusion Findings: CBF (<30%) Volume: 015mPerfusion (Tmax>6.0s) volume: 12m62mismatch Volume: 12mL14mPECTS on noncontrast CT Head: 10 at 0842 hours today. Infarct Core: 0 mL Infarction Location:None IMPRESSION: Negative CT perfusion.  No evidence of infarct or acute ischemia. Electronically Signed   By: CharFranchot Gallo.   On: 07/02/2019 12:40   CT HEAD CODE STROKE WO CONTRAST  Result Date: 07/02/2019 CLINICAL DATA:  Code stroke. Neuro deficit, acute, stroke suspected. Additional history provided: Last known well 20:30. Left-sided deficits and confusion. EXAM: CT HEAD  WITHOUT CONTRAST TECHNIQUE: Contiguous axial images were obtained from the base of the skull through the vertex without intravenous contrast. COMPARISON:  No pertinent prior studies available for comparison. FINDINGS: Brain: No evidence of acute intracranial hemorrhage. No demarcated  cortical infarction. No evidence of intracranial mass. No midline shift or extra-axial fluid collection. Mild ill-defined hypoattenuation within the cerebral white matter is nonspecific, but consistent with chronic small vessel ischemic disease. Small symmetric focal hypodensities within the inferior basal ganglia likely reflect prominent perivascular spaces. Mild generalized parenchymal atrophy. 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 Vascular: No hyperdense vessel.  Atherosclerotic calcifications. Skull: Normal. Negative for fracture or focal lesion. Sinuses/Orbits: Visualized orbits demonstrate no acute abnormality. Mild ethmoid sinus mucosal thickening. Moderate right maxillary sinus mucosal thickening. No significant mastoid effusion. These results were communicated to Dr. Cheral Marker at 8:53 amon 1/15/2021by text page via the [facility] messaging system. IMPRESSION: No evidence of acute intracranial abnormality. Mild generalized parenchymal atrophy and chronic small vessel ischemic disease. Paranasal sinus disease as described. Electronically Signed   By: Kellie Simmering DO   On: 07/02/2019 08:54    EKG: Independently reviewed.  NSR with rate 85; PACs, RBBB, nonspecific ST changes with no evidence of acute ischemia; NSCSLT   Labs on Admission: I have personally reviewed the available labs and imaging studies at the time of the admission.  Pertinent labs:   Glucose 153 BUN 26/Creatinine 1.55/GFR 44 WBC 11.4 INR 1.0   Assessment/Plan Principal Problem:   TIA (transient ischemic attack) Active Problems:   Cardiomyopathy, dilated, nonischemic (HCC)   Controlled type 2 diabetes mellitus  (HCC)   Essential hypertension   Chronic systolic CHF (congestive heart failure) (HCC)   TIA -Patient presenting with 2 episodes of neurologic symptoms concerning for TIA/CVA -Will place in observation status for CVA/TIA evaluation -Telemetry monitoring -MRI appears to be negative for infarction -CTA reveals a right P1 occlusion with reconstituted distal vessels and no visible infarct - suggesting that this was a TIA rather than CVA -Echo -Risk stratification with FLP; will also check TSH and UDS -He was previously taking ASA daily and so would need to change to Plavix as per neurology -Neurology consult -PT/OT/ST/Nutrition Consults -He has a visual field cut per neurology and has been recommended not to drive  HTN -Allow permissive HTN for now -Treat BP only if >220/120, and then with goal of 15% reduction -Hold Coreg, Cozaar and plan to restart in 24 hours   HLD -Check FLP -Continue Crestor 10 mg daily   DM -Recent A1c shows reasonably good control -Hold home PO medications (Glucophage, Jardiance) -Continue Lantus -Will order moderate-scale SSI  Chronic systolic CHF -Prior echo in 08/2018 with corrected EF (55-60% vs. Prior 45%) -Patient had AICD removed -Repeat echo is pending -Appears to be compensated at this time  AKI on Stage 2 CKD -Creatinine in 04/2019 was 1.19, now 1.55 -Patient does not report a diminished oral intake but this may be contributing to his clinical picture -Will provide gentle IVF hydration for now in addition to PO and recheck in AM    Note: This patient has been tested and is pending for the novel coronavirus COVID-19.     DVT prophylaxis:  Lovenox  Code Status: Full - confirmed with patient Family Communication: None present; I spoke with his wife by telephone Disposition Plan:  Home once clinically improved Consults called: Neurology; PT/OT/ST/Nutrition  Admission status: It is my clinical opinion that referral for OBSERVATION is  reasonable and necessary in this patient based on the above information provided. The aforementioned taken together are felt to place the patient at high risk for further clinical deterioration. However it is anticipated that the patient may be medically stable for discharge from  the hospital within 24 to 48 hours.     Karmen Bongo MD [facility]   How to contact the [facility] Attending or Consulting provider [place] or covering provider during after hours [place], for this patient?  1. Check the care team in [facility] and look for a) attending/consulting TR[facility] provider listed and b) the [facility] team listed 2. Log into [email or web address] and use [place]'s universal password to access. If you do not have the password, please contact the hospital operator. 3. Locate the [facility] provider you are looking for under [facility] and page to a number that you can be directly reached. 4. If you still have difficulty reaching the provider, please page the [facility] (Director on Call) for the Hospitalists listed on amion for assistance.   07/02/2019, 4:19 PM

## 2019-07-02 NOTE — Consult Note (Addendum)
NEURO HOSPITALIST CONSULT NOTE   Requesting physician: Dr. Kathrynn Humble  Reason for Consult: Acute onset of left sided weakness and dysarthria.  History obtained from:  Patient and Chart    HPI:                                                                                                                                          Paul [name] is an 74 y.o. male presenting with acute onset of left sided weakness and dysarthria. He has no prior history of stroke or ICH. The neurological deficit has followed a stuttering course. First episode of neurological symptoms occurred yesterday at 1230 and lasted for about 2 hours, consisting of left hand weakness/incoordination, with difficulty typing at his computer. Also had some trouble with "getting words out". He feels that his symptoms resolved completely and that he was normal on going to bed at 2030. This AM symptoms were present on awakening, with left hand grip weakness and confusion. He had difficulty getting his clothes on and had a fall while putting on his pants, without hitting his head. He states his wife got worried and called EMS.   Has a history of defibrillator placement followed by removal in October of 2020. Stroke risk factors include cardiomyopathy, CHF, CAD, HTN, sleep apnea and DM2.  He is not on a blood thinner. He takes ASA 650 mg po qd.   Past Medical History:  Diagnosis Date  . Aftercare following removal/replacement defibrillator    a. defibrillator removed 03/2019.  Marland Kitchen Arthritis    "thumbs, neck, knees" (06/01/2013)  . Cardiomyopathy, dilated, nonischemic ([place]) 10/20/2012  . Cataract   . CHF (congestive heart failure) ([place])   . Chronic systolic heart failure ([place])   . CKD (chronic kidney disease), stage III   . Coronary artery disease   . DVT (deep vein thrombosis) in pregnancy   . 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  . Erectile dysfunction   . Frequent PVCs   . GERD  (gastroesophageal reflux disease)   . Hearing aid worn    B/L  . Hypertension   . Hypogonadism male   . Hypovitaminosis D   . 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 NICM (nonischemic cardiomyopathy) ([place])   . Sleep apnea    cpap  . Type II diabetes mellitus ([place])   . Ventricular tachycardia ([place])    observed during stress testing, subsequent VT on EPS  . Wears glasses     Past Surgical History:  Procedure Laterality Date  . ABLATION  06-01-2013   PVC's ablated along basal inferoseptal RV by Dr Rayann Heman  . APPENDECTOMY  1969  . CARDIAC CATHETERIZATION  04/2010  . CARDIAC DEFIBRILLATOR PLACEMENT  05/09/2010   MDT  ICD implanted in [place] by Dr Ky Barban  . CATARACT EXTRACTION W/ INTRAOCULAR LENS  IMPLANT, BILATERAL Bilateral   . ICD LEAD REMOVAL N/A 03/19/2019   Procedure: ICD LEAD AND GENERATOR REMOVAL;  Surgeon: Evans Lance, MD;  Location: [facility];  Service: Cardiovascular;  Laterality: N/A;  . KNEE ARTHROSCOPY Left 1984  . LUMBAR LAMINECTOMY  ~ 2004  . TONSILLECTOMY AND ADENOIDECTOMY  1972  . TOTAL KNEE ARTHROPLASTY Left 04/28/2017  . TOTAL KNEE ARTHROPLASTY Left 04/28/2017   Procedure: TOTAL KNEE ARTHROPLASTY LEFT;  Surgeon: Dorna Leitz, MD;  Location: [facility];  Service: Orthopedics;  Laterality: Left;  . TOTAL KNEE ARTHROPLASTY Right 05/07/2019   Procedure: TOTAL KNEE ARTHROPLASTY;  Surgeon: Dorna Leitz, MD;  Location: [facility];  Service: Orthopedics;  Laterality: Right;  . V-TACH ABLATION N/A 06/01/2013   Procedure: V-TACH ABLATION;  Surgeon: Coralyn Mark, MD;  Location: [facility];  Service: Cardiovascular;  Laterality: N/A;  . VASECTOMY    . VENTRICULAR ABLATION SURGERY  06/01/2013    Family History  Problem Relation Age of Onset  . Alcohol abuse Mother   . COPD Mother   . Depression Mother   . Heart disease Mother   . Hypertension Mother   . Stroke Mother   . COPD Father   . Heart disease Father   .  Hypertension Father   . Stroke Father   . Cancer Father   . Diabetes Sister   . Mental retardation Sister   . Learning disabilities Sister   . Hypertension Sister   . Alcohol abuse Brother   . Drug abuse Brother   . Heart disease Brother   . Hypertension Brother   . Coronary artery disease Brother               Social History:  reports that he has never smoked. He has never used smokeless tobacco. He reports current alcohol use of about 2.0 standard drinks of alcohol per week. He reports that he does not use drugs.  Allergies  Allergen Reactions  . Lisinopril Swelling    angioedema    MEDICATIONS:                                                                                                                      No current facility-administered medications on file prior to encounter.   Current Outpatient Medications on File Prior to Encounter  Medication Sig Dispense Refill  . acetaminophen (TYLENOL) 500 MG tablet Take 1,000 mg by mouth every 6 (six) hours as needed for moderate pain or headache.    Marland Kitchen aspirin EC 325 MG tablet Take 1 tablet (325 mg total) by mouth 2 (two) times daily after a meal. Take x 1 month post op to decrease risk of blood clots. 60 tablet 0  . Blood Glucose Monitoring Suppl (ONETOUCH VERIO) w/Device KIT Use to check blood sugar 3 times a day. 1 kit 0  . carvedilol (COREG) 6.25 MG tablet TAKE 1&1/2 TABLETS BY  MOUTH TWO TIMES DAILY 90 tablet 3  . docusate sodium (COLACE) 100 MG capsule Take 1 capsule (100 mg total) by mouth 2 (two) times daily. 30 capsule 0  . empagliflozin (JARDIANCE) 10 MG TABS tablet Take 10 mg by mouth daily. 30 tablet 11  . glucose blood (ONETOUCH VERIO) test strip Use to check blood sugar 3 times a day. 200 each 12  . Insulin Glargine (LANTUS SOLOSTAR) 100 UNIT/ML Solostar Pen Inject 38 Units into the skin at bedtime. 45 mL 4  . Lancets MISC Glucose monitor to check blood sugars tid dx code E11.9 brand per insurance coverage 300 each 1   . losartan (COZAAR) 25 MG tablet Take 1 tablet (25 mg total) by mouth daily. 90 tablet 3  . metFORMIN (GLUCOPHAGE-XR) 500 MG 24 hr tablet Take 4 tablets (2,000 mg total) by mouth daily with breakfast. (Patient taking differently: Take 2,000 mg by mouth every evening. ) 360 tablet 1  . oxyCODONE-acetaminophen (PERCOCET/ROXICET) 5-325 MG tablet Take 1-2 tablets by mouth every 6 (six) hours as needed for severe pain. 50 tablet 0  . rosuvastatin (CRESTOR) 10 MG tablet TAKE 1 TABLET (10 MG TOTAL) BY MOUTH DAILY. 30 tablet 6  . spironolactone (ALDACTONE) 25 MG tablet TAKE 1 TABLET BY MOUTH ONCE A DAY 30 tablet 5  . tamsulosin (FLOMAX) 0.4 MG CAPS capsule Take 0.4 mg by mouth at bedtime.     Marland Kitchen tiZANidine (ZANAFLEX) 2 MG tablet Take 1 tablet (2 mg total) by mouth every 8 (eight) hours as needed for muscle spasms. 40 tablet 0  . TRULICITY 1.5 HY/0.7PX SOPN INJECT 1.5MG (1 PEN ) INTO THE SKIN ONCE A WEEK (REPLACE VICTOZA) (Patient taking differently: Inject 1.5 mg into the skin once a week. ) 2 mL 3  . UNIFINE PENTIPS 32G X 4 MM MISC USE TWICE DAILY WITH LANTUS AND VICTOZA 100 each 11     ROS:                                                                                                                                       The patient currently feels that he is asymptomatic. Denies vision changes despite left visual field cut seen on exam. Denies sensory or motor symptoms. Other ROS as per HPI.    There were no vitals taken for this visit.   General Examination:  Physical Exam  HEENT-  [place]/AT  Lungs-Respirations unlabored.  Extremities- Pitting edema distal BLE. Healing incision anterior right knee. Old scar anterior left knee.   Neurological Examination Mental Status: Alert, oriented 4/5 (stated the day was "Thursday". Apraxia noted with RUE when patient attempting to replace mask. Speech fluent  with intact comprehension, naming and repetition. No dysarthria although he has a slight lisp that most likely is chronic (he denies being aware of any speech impediment). Mild anosognosia (see below) Cranial Nerves: II: Left homonymous hemianopsia (patient denies having any vision problem, c/w anosognosia). PERRL.   III,IV, VI: No ptosis. EOMI. No nystagmus.  V,VII: No facial droop. Temp sensation equal bilaterally.  VIII: hearing intact to conversation.  IX,X: Palate rises symmetrically XI: Head is midline XII: midline tongue extension Motor: RUE and RLE 5/5 LUE 4+/5 grip, 4-/5 finger abduction, 5/5 wrist flexion/extension, 4+/5 biceps and triceps, 4/5 deltoid.  LLE 5/5 Prominent left parietal drift is noted.  Sensory: Temp intact x 4. FT intact x 4 with limbs tested individually. Extinction to DSS on the left.   Deep Tendon Reflexes: 2+ bilateral brachioradialis and biceps. Patellar reflexes deferred. Right achilles 2+, left achilles 0. Right toe upgoing, left toe downgoing.  Cerebellar: No ataxia with FNF bilaterally; slightly slower movement on the left.  Gait: Deferred   Lab Results: Basic Metabolic Panel: No results for input(s): NA, K, CL, CO2, GLUCOSE, BUN, CREATININE, CALCIUM, MG, PHOS in the last 168 hours.  CBC: No results for input(s): WBC, NEUTROABS, HGB, HCT, MCV, PLT in the last 168 hours.  Cardiac Enzymes: No results for input(s): CKTOTAL, CKMB, CKMBINDEX, TROPONINI in the last 168 hours.  Lipid Panel: No results for input(s): CHOL, TRIG, HDL, CHOLHDL, VLDL, LDLCALC in the last 168 hours.  Imaging: No results found.   Assessment: 74 year old male presenting with acute onset of left sided weakness and dysarthria.  1. Stuttering clinical course. Had one episode with similar symptoms yesterday afternoon that resolved after about 2 hours.  2. CT head with no acute abnormality. 3. 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  4. Recent  removal of implantable defibrillator. No contraindications to MRI.  5. Recent knee surgery. Currently taking 650 mg ASA qd for prevention of DVT per patient.  6. Not a tPA candidate due to time criteria 7. Not an endovascular candidate as clinical presentation not consistent with LVO.   Recommendations: 1. CTA of head and neck  2. MRI brain 3. TTE 4. Cardiac telemetry 5. HgbA1C and fasting lipid panel 6. PT/OT/Speech 7. Classifiable as having failed ASA monotherapy. May need to be started on Plavix.  8. Switch rosuvastatin to atorvastatin 40 mg po qd 9. Permissive HTN x 24 hours. Modified protocol due to age. Correct if SBP > 200.  10. Recommended not to drive as he has a left sided visual field cut that would put him at risk for MVA.   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 Acuity is indeterminate. Also noted is moderate narrowing of the left V4 segment. There is atherosclerosis without significant stenosis in the neck or anterior circulation.  CTP has been obtained. No perfusion deficit is seen.   Will obtain expedited MRI brain.   Discussed with patient.    Electronically signed: Dr. Kerney Elbe 07/02/2019, 8:41 AM

## 2019-07-02 NOTE — ED Notes (Signed)
Patient transported to X-ray MRI 

## 2019-07-02 NOTE — ED Notes (Signed)
Pt up OOB looking for items in his belongings bag.  NAD noted.

## 2019-07-02 NOTE — ED Notes (Signed)
Pt slightly restless with UE, has tangled himself in cords and gown attempting to use urinal.  Able to follow instructions, alert and oriented.  Updated on POC.

## 2019-07-02 NOTE — Care Management CC44 (Signed)
Condition Code 44 Documentation Completed  Patient Details  Name: Paul [name] MRN: [id number] Date of Birth: Oct 16, 1945   Condition Code 44 given:  Yes Patient signature on Condition Code 44 notice:  Yes Documentation of 2 MD's agreement:    Code 44 added to claim:  Yes    Deonna Krummel, LCSW 07/02/2019, 4:42 PM

## 2019-07-02 NOTE — ED Provider Notes (Signed)
[facility] EMERGENCY DEPARTMENT Provider Note   CSN: [id number] Arrival date & time: 07/02/19  5465  An emergency department physician performed an initial assessment on this suspected stroke patient at 0831.  History Chief Complaint  Patient presents with  . Code Stroke    BUDDY LOEFFELHOLZ is a 74 y.o. male.  HPI     74 year old with history of CKD, DVT, PVCs, dilated cardiomyopathy comes in a chief complaint of left-sided weakness.  According to the patient he started having some left-sided deficits well charting on EMR yesterday at noon.  His colleagues did not articulate exactly what they noticed but they did inform him that he was appearing to be a little bit off and not performing at his normal capacity.  They also informed him that he had some slurred speech at one point.  Patient at no point appreciated being weak or having slurred speech.  He did not notice any change in his performance either.  At his house his wife also felt like patient was slightly off.  He woke up with similar symptoms in the morning and was having difficulty getting his clothes on and then falling, which prompted his wife to call EMS.  She also felt that patient was a little bit confused.  Code stroke was activated on the field.   Past Medical History:  Diagnosis Date  . Aftercare following removal/replacement defibrillator    a. defibrillator removed 03/2019.  Marland Kitchen Arthritis    "thumbs, neck, knees" (06/01/2013)  . Cardiomyopathy, dilated, nonischemic ([place]) 10/20/2012  . Cataract   . CHF (congestive heart failure) ([place])   . Chronic systolic heart failure ([place])   . CKD (chronic kidney disease), stage III   . Coronary artery disease   . DVT (deep vein thrombosis) in pregnancy   . 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  . Erectile dysfunction   . Frequent PVCs   . GERD (gastroesophageal reflux disease)   . Hearing aid worn    B/L  . Hypertension   . Hypogonadism  male   . Hypovitaminosis D   . 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 NICM (nonischemic cardiomyopathy) ([place])   . Sleep apnea    cpap  . Type II diabetes mellitus ([place])   . Ventricular tachycardia ([place])    observed during stress testing, subsequent VT on EPS  . Wears glasses     Patient Active Problem List   Diagnosis Date Noted  . CVA (cerebral vascular accident) ([place]) 07/02/2019  . Primary osteoarthritis of right knee 05/07/2019  . Aftercare following removal or replacement of defibrillator 03/20/2019  . 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 Chronic systolic CHF (congestive heart failure) ([place]) 05/04/2014  . PVC's (premature ventricular contractions) 06/01/2013  . Essential hypertension 05/17/2013  . Cardiomyopathy, dilated, nonischemic ([place]) 10/20/2012  . Ventricular tachycardia/frequent PVCs 10/20/2012  . Poorly controlled type 2 diabetes mellitus with peripheral neuropathy ([place]) 10/20/2012  . Hypogonadism male 10/20/2012    Past Surgical History:  Procedure Laterality Date  . ABLATION  06-01-2013   PVC's ablated along basal inferoseptal RV by Dr Rayann Heman  . APPENDECTOMY  1969  . CARDIAC CATHETERIZATION  04/2010  . CARDIAC DEFIBRILLATOR PLACEMENT  05/09/2010   MDT ICD implanted in [place] by Dr Ky Barban  . CATARACT EXTRACTION W/ INTRAOCULAR LENS  IMPLANT, BILATERAL Bilateral   . ICD LEAD REMOVAL N/A 03/19/2019   Procedure: ICD LEAD AND  GENERATOR REMOVAL;  Surgeon: Evans Lance, MD;  Location: [facility];  Service: Cardiovascular;  Laterality: N/A;  . KNEE ARTHROSCOPY Left 1984  . LUMBAR LAMINECTOMY  ~ 2004  . TONSILLECTOMY AND ADENOIDECTOMY  1972  . TOTAL KNEE ARTHROPLASTY Left 04/28/2017  . TOTAL KNEE ARTHROPLASTY Left 04/28/2017   Procedure: TOTAL KNEE ARTHROPLASTY LEFT;  Surgeon: Dorna Leitz, MD;  Location: [facility];  Service: Orthopedics;  Laterality: Left;  . TOTAL KNEE  ARTHROPLASTY Right 05/07/2019   Procedure: TOTAL KNEE ARTHROPLASTY;  Surgeon: Dorna Leitz, MD;  Location: [facility];  Service: Orthopedics;  Laterality: Right;  . V-TACH ABLATION N/A 06/01/2013   Procedure: V-TACH ABLATION;  Surgeon: Coralyn Mark, MD;  Location: [facility] CATH [facility];  Service: Cardiovascular;  Laterality: N/A;  . VASECTOMY    . VENTRICULAR ABLATION SURGERY  06/01/2013       Family History  Problem Relation Age of Onset  . Alcohol abuse Mother   . COPD Mother   . Depression Mother   . Heart disease Mother   . Hypertension Mother   . Stroke Mother   . COPD Father   . Heart disease Father   . Hypertension Father   . Stroke Father   . Cancer Father   . Diabetes Sister   . Mental retardation Sister   . Learning disabilities Sister   . Hypertension Sister   . Alcohol abuse Brother   . Drug abuse Brother   . Heart disease Brother   . Hypertension Brother   . Coronary artery disease Brother     Social History   Tobacco Use  . Smoking status: Never Smoker  . Smokeless tobacco: Never Used  Substance Use Topics  . Alcohol use: Yes    Alcohol/week: 2.0 standard drinks    Types: 1 Glasses of wine, 1 Cans of beer per week    Comment: social  . Drug use: No    Home Medications Prior to Admission medications   Medication Sig Start Date End Date Taking? Authorizing Provider  acetaminophen (TYLENOL) 500 MG tablet Take 1,000 mg by mouth every 6 (six) hours as needed for moderate pain or headache.   Yes [provider]  aspirin EC 325 MG tablet Take 1 tablet (325 mg total) by mouth 2 (two) times daily after a meal. Take x 1 month post op to decrease risk of blood clots. 05/07/19  Yes Gary Fleet, PA-C  carvedilol (COREG) 6.25 MG tablet TAKE 1&1/2 TABLETS BY MOUTH TWO TIMES DAILY 05/07/19  Yes Larey Dresser, MD  docusate sodium (COLACE) 100 MG capsule Take 1 capsule (100 mg total) by mouth 2 (two) times daily. 05/07/19  Yes Gary Fleet, PA-C  empagliflozin  (JARDIANCE) 10 MG TABS tablet Take 10 mg by mouth daily. 09/21/18  Yes Philemon Kingdom, MD  losartan (COZAAR) 25 MG tablet Take 1 tablet (25 mg total) by mouth daily. 02/24/19 07/02/19 Yes Larey Dresser, MD  metFORMIN (GLUCOPHAGE-XR) 500 MG 24 hr tablet Take 4 tablets (2,000 mg total) by mouth daily with breakfast. 01/05/19  Yes Philemon Kingdom, MD  rosuvastatin (CRESTOR) 10 MG tablet TAKE 1 TABLET (10 MG TOTAL) BY MOUTH DAILY. 04/22/19  Yes Larey Dresser, MD  spironolactone (ALDACTONE) 25 MG tablet TAKE 1 TABLET BY MOUTH ONCE A DAY 05/07/19  Yes Larey Dresser, MD  tamsulosin (FLOMAX) 0.4 MG CAPS capsule Take 0.4 mg by mouth at bedtime.    Yes [provider]  TRULICITY 1.5 FI/4.3PI SOPN INJECT 1.5MG (1  PEN ) INTO THE SKIN ONCE A WEEK (REPLACE VICTOZA) Patient taking differently: Inject 1.5 mg into the skin once a week.  04/08/19  Yes Philemon Kingdom, MD  Blood Glucose Monitoring Suppl (ONETOUCH VERIO) w/Device KIT Use to check blood sugar 3 times a day. 10/07/18   Philemon Kingdom, MD  glucose blood (ONETOUCH VERIO) test strip Use to check blood sugar 3 times a day. 10/07/18   Philemon Kingdom, MD  Insulin Glargine (LANTUS SOLOSTAR) 100 UNIT/ML Solostar Pen Inject 38 Units into the skin at bedtime. Patient taking differently: Inject 34 Units into the skin at bedtime.  09/02/17   Philemon Kingdom, MD  Lancets MISC Glucose monitor to check blood sugars tid dx code E11.9 brand per insurance coverage 07/23/17   Philemon Kingdom, MD  oxyCODONE-acetaminophen (PERCOCET/ROXICET) 5-325 MG tablet Take 1-2 tablets by mouth every 6 (six) hours as needed for severe pain. Patient not taking: Reported on 07/02/2019 05/07/19   Gary Fleet, PA-C  tiZANidine (ZANAFLEX) 2 MG tablet Take 1 tablet (2 mg total) by mouth every 8 (eight) hours as needed for muscle spasms. 05/07/19   Gary Fleet, PA-C  UNIFINE PENTIPS 32G X 4 MM MISC USE TWICE DAILY WITH LANTUS AND VICTOZA 06/10/19   Philemon Kingdom,  MD    Allergies    Lisinopril  Review of Systems   Review of Systems  Constitutional: Positive for activity change.  Respiratory: Negative for shortness of breath.   Cardiovascular: Negative for chest pain.  Neurological: Positive for weakness. Negative for dizziness, speech difficulty, light-headedness, numbness and headaches.  Psychiatric/Behavioral: Positive for confusion.  All other systems reviewed and are negative.   Physical Exam Updated Vital Signs BP (!) 166/96 (BP Location: Right Arm)   Pulse 85   Temp 99 F (37.2 C) (Oral)   Resp 12   SpO2 98%   Physical Exam Vitals and nursing note reviewed.  Constitutional:      Appearance: He is well-developed.  HENT:     Head: Atraumatic.  Eyes:     Extraocular Movements: Extraocular movements intact.     Pupils: Pupils are equal, round, and reactive to light.  Cardiovascular:     Rate and Rhythm: Normal rate.  Pulmonary:     Effort: Pulmonary effort is normal.  Musculoskeletal:     Cervical back: Neck supple.     Right lower leg: Edema present.     Left lower leg: No edema.  Skin:    General: Skin is warm.  Neurological:     Mental Status: He is alert and oriented to person, place, and time.     Cranial Nerves: No cranial nerve deficit.     Sensory: No sensory deficit.     Motor: Weakness present.     Coordination: Coordination normal.     Comments: Patient is noted to have grip deficits over the left hand     ED Results / Procedures / Treatments   Labs (all labs ordered are listed, but only abnormal results are displayed) Labs Reviewed  CBC - Abnormal; Notable for the following components:      Result Value   WBC 11.4 (*)    All other components within normal limits  DIFFERENTIAL - Abnormal; Notable for the following components:   Neutro Abs 8.8 (*)    All other components within normal limits  COMPREHENSIVE METABOLIC PANEL - Abnormal; Notable for the following components:   Glucose, Bld 153 (*)     BUN 26 (*)    Creatinine, Ser  1.55 (*)    Total Protein 6.0 (*)    GFR calc non Af Amer 44 (*)    GFR calc Af Amer 51 (*)    All other components within normal limits  I-STAT CHEM 8, ED - Abnormal; Notable for the following components:   BUN 28 (*)    Creatinine, Ser 1.50 (*)    Glucose, Bld 147 (*)    Calcium, Ion 1.11 (*)    All other components within normal limits  CBG MONITORING, ED - Abnormal; Notable for the following components:   Glucose-Capillary 144 (*)    All other components within normal limits  SARS CORONAVIRUS 2 (TAT 6-24 HRS)  PROTIME-INR  APTT    EKG EKG Interpretation  Date/Time:  Friday July 02 2019 08:50:29 EST Ventricular Rate:  85 PR Interval:    QRS Duration: 154 QT Interval:  392 QTC Calculation: 467 R Axis:   -62 Text Interpretation: Sinus rhythm Atrial premature complex Right bundle branch block Inferor infarct, old No significant change since last tracing Confirmed by Varney Biles [phone]) on 07/02/2019 10:19:44 AM   Radiology CT HEAD CODE STROKE WO CONTRAST  Result Date: 07/02/2019 CLINICAL DATA:  Code stroke. Neuro deficit, acute, stroke suspected. Additional history provided: Last known well 20:30. Left-sided deficits and confusion. EXAM: CT HEAD WITHOUT CONTRAST TECHNIQUE: Contiguous axial images were obtained from the base of the skull through the vertex without intravenous contrast. COMPARISON:  No pertinent prior studies available for comparison. FINDINGS: Brain: No evidence of acute intracranial hemorrhage. No demarcated cortical infarction. No evidence of intracranial mass. No midline shift or extra-axial fluid collection. Mild ill-defined hypoattenuation within the cerebral white matter is nonspecific, but consistent with chronic small vessel ischemic disease. Small symmetric focal hypodensities within the inferior basal ganglia likely reflect prominent perivascular spaces. Mild generalized parenchymal atrophy. 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 Vascular: No hyperdense vessel.  Atherosclerotic calcifications. Skull: Normal. Negative for fracture or focal lesion. Sinuses/Orbits: Visualized orbits demonstrate no acute abnormality. Mild ethmoid sinus mucosal thickening. Moderate right maxillary sinus mucosal thickening. No significant mastoid effusion. These results were communicated to Dr. Cheral Marker at 8:53 amon 1/15/2021by text page via the [facility] messaging system. IMPRESSION: No evidence of acute intracranial abnormality. Mild generalized parenchymal atrophy and chronic small vessel ischemic disease. Paranasal sinus disease as described. Electronically Signed   By: Kellie Simmering DO   On: 07/02/2019 08:54    Procedures Procedures (including critical care time)  Medications Ordered in ED Medications  sodium chloride flush (NS) 0.9 % injection 3 mL (has no administration in time range)    ED Course  I have reviewed the triage vital signs and the nursing notes.  Pertinent labs & imaging results that were available during my care of the patient were reviewed by me and considered in my medical decision making (see chart for details).    MDM Rules/Calculators/A&P                       74 year old comes in a chief complaint of left-sided grip strength weakness, confusion and single episode of possible slurred speech.  He is noted to have grip deficit on my exam.  Code stroke was activated on the field.  Last normal was yesterday afternoon.  Patient is outside of TPA window and clinically this does not appear to be LVO requiring endovascular intervention.  Neuro team at the bedside.  They concur that patient will need admission for stroke  work-up.  CT scan is showing evidence of subacute stroke.  Final Clinical Impression(s) / ED Diagnoses Final diagnoses:  Acute ischemic stroke [facility])    Rx / DC Orders ED Discharge Orders    None       Varney Biles, MD 07/02/19 1021

## 2019-07-02 NOTE — ED Notes (Signed)
Dr. Lorin Mercy notified of current BP readings.  Will order PRN meds if pressure is over 200.  Pt denies any pain or changes.

## 2019-07-02 NOTE — Progress Notes (Signed)
Patient arrived to 3w-10 at 1830

## 2019-07-02 NOTE — Progress Notes (Signed)
  Echocardiogram 2D Echocardiogram has been attempted. Patient left for MRI. Will reattempt.  [name] 07/02/2019, 2:11 PM

## 2019-07-03 ENCOUNTER — Other Ambulatory Visit: Payer: Self-pay

## 2019-07-03 DIAGNOSIS — G459 Transient cerebral ischemic attack, unspecified: Secondary | ICD-10-CM | POA: Diagnosis not present

## 2019-07-03 LAB — LIPID PANEL
Cholesterol: 91 mg/dL (ref 0–200)
HDL: 34 mg/dL — ABNORMAL LOW (ref >40)
LDL Cholesterol: 40 mg/dL (ref 0–99)
Total CHOL/HDL Ratio: 2.7 RATIO
Triglycerides: 87 mg/dL (ref <150)
VLDL: 17 mg/dL (ref 0–40)

## 2019-07-03 LAB — GLUCOSE, CAPILLARY
Glucose-Capillary: 50 mg/dL — ABNORMAL LOW (ref 70–99)
Glucose-Capillary: 202 mg/dL — ABNORMAL HIGH (ref 70–99)
Glucose-Capillary: 171 mg/dL — ABNORMAL HIGH (ref 70–99)

## 2019-07-03 LAB — ECHOCARDIOGRAM COMPLETE

## 2019-07-03 MED ORDER — CLOPIDOGREL BISULFATE 75 MG PO TABS: 75.0000 mg | ORAL_TABLET | Freq: Every day | ORAL | 1 refills | Status: DC

## 2019-07-03 MED ORDER — METFORMIN HCL ER 500 MG PO TB24: 2000.0000 mg | ORAL_TABLET | Freq: Every day | ORAL | 1 refills | Status: DC

## 2019-07-03 MED ORDER — CARVEDILOL 6.25 MG PO TABS: 9.3750 mg | ORAL_TABLET | Freq: Two times a day (BID) | ORAL | Status: DC

## 2019-07-03 MED ORDER — DEXTROSE 50 % IV SOLN
INTRAVENOUS | Status: AC
  Administered 2019-07-03: 50 mL
  Filled 2019-07-03: qty 50

## 2019-07-03 MED ORDER — ASPIRIN EC 81 MG PO TBEC: 81.0000 mg | DELAYED_RELEASE_TABLET | Freq: Every day | ORAL | Status: DC

## 2019-07-03 NOTE — Evaluation (Addendum)
Physical Therapy Evaluation Patient Details Name: Paul [name] MRN: [id number] DOB: 02-11-1946 Today's Date: 07/03/2019   History of Present Illness  Paul [name] is a 74 y.o. male with medical history significant of DM; OSA; chronic systolic CHF with AICD placement; hypogonadism; HTN; stage 3 CKD; recent right TKA presenting with left-sided weakness and mid confusion.CTA reveals a right P1 occlusion with reconstituted distal vessels and no visible infarct on noncontrast CT. MRI negative for acute infarct.  Clinical Impression  Pt admitted with above. Prior to admission, pt works as a Engineer, drilling for [employer]. Upon entrance to room, pt holding his cell phone upside down, needing step by step cueing on how to dial his wife. Pt presents with left visual field deficit (particularly inferior field), dynamic balance impairments, and decreased cognition, including decreased awareness and short term memory recall. Pt with no recognition of deficits despite apparent errors during examination. Ambulating hallway distances with no assistive device at a supervision level. Needs max cues for way finding and navigating in hallways. These results are troublesome in context of duties required with pt occupation. Recommended to pt no driving currently, supervision for medication management and potential work modifications. Discussed as well with pt wife to reinforce with pt permission. Would benefit from follow up OPPT to address deficits.     Follow Up Recommendations Outpatient PT;Supervision - Intermittent    Equipment Recommendations  None recommended by PT    Recommendations for Other Services OT consult     Precautions / Restrictions Precautions Precautions: Fall Restrictions Weight Bearing Restrictions: No      Mobility  Bed Mobility Overal bed mobility: Independent                Transfers Overall transfer level: Independent Equipment used: None                 Ambulation/Gait Ambulation/Gait assistance: Supervision Gait Distance (Feet): 380 Feet Assistive device: None Gait Pattern/deviations: Step-through pattern;Wide base of support Gait velocity: decreased   General Gait Details: Pt with mild dynamic balance impairments, requiring supervision for safety. Tendency for right foot external rotation.  Stairs            Wheelchair Mobility    Modified Rankin (Stroke Patients Only) Modified Rankin (Stroke Patients Only) Pre-Morbid Rankin Score: No symptoms Modified Rankin: Moderately severe disability     Balance Overall balance assessment: Mild deficits observed, not formally tested                                           Pertinent Vitals/Pain Pain Assessment: No/denies pain    Home Living Family/patient expects to be discharged to:: Private residence Living Arrangements: Spouse/significant other Available Help at Discharge: Family;Available 24 hours/day Type of Home: House Home Access: Stairs to enter Entrance Stairs-Rails: None Entrance Stairs-Number of Steps: 1 step through garage no rails Home Layout: Two level;Able to live on main level with bedroom/bathroom Home Equipment: Grab bars - tub/shower;Shower seat - built in;Walker - 2 wheels;Cane - single point      Prior Function Level of Independence: Independent         Comments: pt works full time as a Engineer, 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    Extremity/Trunk Assessment   Upper Extremity Assessment Upper Extremity Assessment: RUE deficits/detail;LUE deficits/detail RUE Deficits / Details: Strength 5/5 LUE  Deficits / Details: Strength 5/5    Lower Extremity Assessment Lower Extremity Assessment: RLE deficits/detail;LLE deficits/detail RLE Deficits / Details: Strength 5/5 LLE Deficits / Details: Strength 5/5       Communication   Communication: No difficulties  Cognition Arousal/Alertness:  Awake/alert Behavior During Therapy: WFL for tasks assessed/performed Overall Cognitive Status: Impaired/Different from baseline Area of Impairment: Memory;Safety/judgement                     Memory: Decreased short-term memory   Safety/Judgement: Decreased awareness of deficits     General Comments: Pt received attempting to unlock his phone with his phone upside down; despite reorientation, pt continuing to hold his phone upside down with his second attempt to call his wife. Pt adamantly denying deficits despite visual impairments found on examination. Difficulty with wayfinding/navigating and finding rooms on left side of hallway. Short term memory deficits noted with stating incorrect month of TKA and working memory of finding room numbers.       General Comments      Exercises     Assessment/Plan    PT Assessment Patient needs continued PT services  PT Problem List Decreased balance;Decreased mobility;Decreased cognition;Decreased safety awareness       PT Treatment Interventions Gait training;Stair training;Functional mobility training;Therapeutic activities;Therapeutic exercise;Balance training;Patient/family education    PT Goals (Current goals can be found in the Care Plan section)  Acute Rehab PT Goals Patient Stated Goal: go back to work PT Goal Formulation: With patient Time For Goal Achievement: 07/17/19 Potential to Achieve Goals: Good    Frequency Min 4X/week   Barriers to discharge        Co-evaluation               AM-PAC PT "6 Clicks" Mobility  Outcome Measure Help needed turning from your back to your side while in a flat bed without using bedrails?: None Help needed moving from lying on your back to sitting on the side of a flat bed without using bedrails?: None Help needed moving to and from a bed to a chair (including a wheelchair)?: None Help needed standing up from a chair using your arms (e.g., wheelchair or bedside chair)?:  None Help needed to walk in hospital room?: None Help needed climbing 3-5 steps with a railing? : A Little 6 Click Score: 23    End of Session Equipment Utilized During Treatment: Gait belt Activity Tolerance: Patient tolerated treatment well Patient left: in chair;with call bell/phone within reach;with chair alarm set Nurse Communication: Mobility status PT Visit Diagnosis: Unsteadiness on feet (R26.81);Other abnormalities of gait and mobility (R26.89);Other symptoms and signs involving the nervous system (R29.898)    Time: [id number] PT Time Calculation (min) (ACUTE ONLY): 35 min   Charges:   PT Evaluation $PT Eval Moderate Complexity: 1 Mod PT Treatments $Therapeutic Activity: 8-22 mins        Ellamae Sia, PT, DPT Acute Rehabilitation Services Pager [phone] Office [phone]   Willy Eddy 07/03/2019, 9:08 AM

## 2019-07-03 NOTE — Progress Notes (Signed)
Patient discharging home with outpatient PT/OT. Wife here for transport home. All discharge paperwork went over with patient and wife. All questions and concerns addressed. Patient to be taken down in wheelchair. [place]

## 2019-07-03 NOTE — Discharge Summary (Addendum)
Physician Discharge Summary  Paul [name] VVK:[id number] DOB: February 25, 1946 DOA: 07/02/2019  PCP: Patient, No Pcp Per  Admit date: 07/02/2019 Discharge date: 07/03/2019  Admitted From: home Discharge disposition: home   Recommendations for Outpatient Follow-Up:   1. 30 day event monitor- sent message to cardiology 2. Outpatient PT/OT   Discharge Diagnosis:   Principal Problem:   TIA (transient ischemic attack) Active Problems:   Cardiomyopathy, dilated, nonischemic (HCC)   Controlled type 2 diabetes mellitus ([place])   Essential hypertension   Chronic systolic CHF (congestive heart failure) ([place])    Discharge Condition: Improved.  Diet recommendation: Low sodium, heart healthy.  Carbohydrate-modified.    Wound care: None.  Code status: Full.   History of Present Illness:  Paul [name] is a 74 y.o. male with medical history significant of DM; OSA; chronic systolic CHF with AICD placement; hypogonadism; HTN; and stage 3 CKD presenting with left-sided weakness.  Yesterday at work he had difficulty with motor in his left hand, unable to type in the EMR.  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 It started around noon and resolved by the time he got home it seemed resolved.  His wife was concerned about it this AM and thought he needed to come for evaluation and called 911.  She again thought his speech was not quite right.  He is not noticing difficulty.  No dysphagia.  He had a TKR on the right about 6 weeks ago.   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 Mild chronic microvascular ischemic change in the white matter.  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 Acuity is indeterminate.   CT Perfusion - Negative CT perfusion.  No evidence of infarct or acute ischemia.   2D Echo - EF 55 - 60%. No cardiac source of emboli identified.   Sars Corona Virus 2 - negative  LDL - 40  HgbA1c - 7.0 -OT/PT: outpatient PT/OT -asa/plavix for 3 weeks then plavix  Hypertension  Home BP meds: Cozaar, Coreg, Aldactone  Hyperlipidemia  Crestor 10 mg daily  LDL 40, goal < 70   Diabetes  Home diabetic meds: Jardiance, Metformin, Insulin  Current diabetic meds: insulin  HgbA1c 7.0, goal < 7.0    Medical Consultants:   neurology   Discharge Exam:   Vitals:   07/03/19 0805 07/03/19 1126  BP: (!) 146/83 (!) 141/84  Pulse: 88 (!) 103  Resp: 18 16  Temp: 98.9 F (37.2 C) 99.3 F (37.4 C)  SpO2: 96% 100%   Vitals:   07/03/19 0321 07/03/19 0626 07/03/19 0805 07/03/19 1126  BP: (!) 170/87 135/78 (!) 146/83 (!) 141/84  Pulse: 91 98 88 (!) 103  Resp: _0 Temp: 98.7 F (37.1 C) 98.5 F (36.9 C) 98.9 F (37.2 C) 99.3 F (37.4 C)  TempSrc: Oral Oral Oral Oral  SpO2: 98% 94% 96% 100%    General exam: Appears calm and comfortable.   The results of significant diagnostics from this hospitalization (including imaging, microbiology, ancillary and laboratory) are listed below for reference.     Procedures and Diagnostic Studies:   CT  Code Stroke CTA Head W/WO contrast  Result Date: 07/02/2019 CLINICAL DATA:  Left-sided weakness yesterday with slurred speech. EXAM: CT ANGIOGRAPHY HEAD AND NECK TECHNIQUE: Multidetector CT imaging of the head and neck was performed using the standard protocol during bolus administration of intravenous contrast. Multiplanar CT image reconstructions and MIPs were obtained to evaluate the vascular anatomy. Carotid stenosis measurements (when applicable) are obtained utilizing NASCET criteria, using the distal internal carotid  diameter as the denominator. CONTRAST:  68m OMNIPAQUE IOHEXOL 350 MG/ML SOLN COMPARISON:  Head CT from earlier today FINDINGS: CT HEAD FINDINGS Brain: Streak artifact along the inner table which limits assessment of cortex for detecting subtle infarcts. No convincing infarct. No hemorrhage, hydrocephalus, or masslike finding Vascular: Arterial findings below Skull: Negative Sinuses: Negative Orbits: Bilateral cataract resection Review of the MIP images confirms the above findings CTA NECK FINDINGS Aortic arch: Atherosclerotic plaque.  Four vessel branching. Right carotid system: Calcified bulb plaque with no significant stenosis and no ulceration. Left carotid system: Calcified bulb plaque and mild common carotid wall thickening. No flow limiting stenosis or ulceration. Vertebral arteries: The left vertebral artery arises from the arch. Both vertebral arteries are smooth and widely patent to the dura. No proximal subclavian stenosis on the right. Skeleton: Advanced lower cervical and mid cervical disc narrowing with ridging and limited posterior longitudinal ligament ossification (C5 and C6). Degenerative facet spurring is also prominent and diffuse. Other neck: Chronic right maxillary sinusitis Upper chest: Negative Review of the MIP images confirms the above findings CTA HEAD FINDINGS Anterior circulation: Atherosclerotic calcification along the carotid siphons. No flow limiting stenosis. No branch occlusion or aneurysm. Posterior circulation: Moderate atheromatous narrowing of the left V4 segment. Atherosclerotic irregularity of the distal basilar without high-grade narrowing. There is a fetal type left PCA. No flow seen beyond the right P1 segment with tiny collaterals apparently reconstituting reconstitution of right occipital branches. Venous sinuses: Negative Anatomic variants: As above Review of the MIP images confirms the above findings These results were communicated to Dr. LCheral Markerat 11:27 amon  1/15/2021by text page via the [facility] system. IMPRESSION: 1. 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 Acuity is indeterminate. 2. Moderate narrowing of the left V4 segment. 3. Atherosclerosis without significant stenosis in the neck or anterior circulation. Electronically Signed   By: JMonte FantasiaM.D.   On: 07/02/2019 11:29   CT Code Stroke CTA Neck W/WO contrast  Result Date: 07/02/2019 CLINICAL DATA:  Left-sided weakness yesterday with slurred speech. EXAM: CT ANGIOGRAPHY HEAD AND NECK TECHNIQUE: Multidetector CT imaging of the head and neck was performed using the standard protocol during bolus administration of intravenous contrast. Multiplanar CT image reconstructions and MIPs were obtained to evaluate the vascular anatomy. Carotid stenosis measurements (when applicable) are obtained utilizing NASCET criteria, using the distal internal carotid diameter as the denominator. CONTRAST:  719mOMNIPAQUE IOHEXOL 350 MG/ML SOLN COMPARISON:  Head CT from earlier today FINDINGS: CT HEAD FINDINGS Brain: Streak artifact along the inner table which limits assessment of cortex for detecting subtle infarcts. No convincing infarct. No hemorrhage, hydrocephalus, or masslike finding Vascular: Arterial findings below Skull: Negative Sinuses: Negative Orbits: Bilateral cataract resection Review of the MIP images confirms the above findings CTA NECK FINDINGS Aortic arch: Atherosclerotic plaque.  Four vessel branching. Right carotid system: Calcified bulb plaque with no significant stenosis and no ulceration. Left carotid system: Calcified bulb plaque and mild common carotid wall thickening. No flow limiting stenosis or ulceration. Vertebral  arteries: The left vertebral artery arises from the arch. Both vertebral arteries are smooth and widely patent to the dura. No proximal subclavian stenosis on the right. Skeleton: Advanced lower cervical and mid cervical disc  narrowing with ridging and limited posterior longitudinal ligament ossification (C5 and C6). Degenerative facet spurring is also prominent and diffuse. Other neck: Chronic right maxillary sinusitis Upper chest: Negative Review of the MIP images confirms the above findings CTA HEAD FINDINGS Anterior circulation: Atherosclerotic calcification along the carotid siphons. No flow limiting stenosis. No branch occlusion or aneurysm. Posterior circulation: Moderate atheromatous narrowing of the left V4 segment. Atherosclerotic irregularity of the distal basilar without high-grade narrowing. There is a fetal type left PCA. No flow seen beyond the right P1 segment with tiny collaterals apparently reconstituting reconstitution of right occipital branches. Venous sinuses: Negative Anatomic variants: As above Review of the MIP images confirms the above findings These results were communicated to Dr. Cheral Marker at 11:27 amon 1/15/2021by text page via the [facility] messaging system. IMPRESSION: 1. 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 Acuity is indeterminate. 2. Moderate narrowing of the left V4 segment. 3. Atherosclerosis without significant stenosis in the neck or anterior circulation. Electronically Signed   By: Monte Fantasia M.D.   On: 07/02/2019 11:29   MR BRAIN WO CONTRAST  Result Date: 07/02/2019 CLINICAL DATA:  Stroke.  Left-sided weakness EXAM: MRI HEAD WITHOUT CONTRAST TECHNIQUE: Multiplanar, multiecho pulse sequences of the brain and surrounding structures were obtained without intravenous contrast. COMPARISON:  CTA head 07/02/2019 FINDINGS: Brain: Negative for acute infarct. Mild chronic microvascular ischemic changes in the white matter. Prominent cisterna magna. Negative for hemorrhage or mass. No ventricular enlargement or midline shift. Vascular: Normal arterial flow voids Skull and upper cervical spine: No focal skeletal lesions. Sinuses/Orbits: Mucosal edema  and air-fluid level right maxillary sinus. Coil edema ethmoid sinuses. Bilateral cataract surgery Other: None IMPRESSION: Negative for acute infarct. Mild chronic microvascular ischemic change in the white matter. Electronically Signed   By: Franchot Gallo M.D.   On: 07/02/2019 14:32   CT CEREBRAL PERFUSION W CONTRAST  Result Date: 07/02/2019 CLINICAL DATA:  Right P1 occlusion on CTA today. Left-sided weakness and slurred speech EXAM: CT PERFUSION BRAIN TECHNIQUE: Multiphase CT imaging of the brain was performed following IV bolus contrast injection. Subsequent parametric perfusion maps were calculated using RAPID software. CONTRAST:  28m OMNIPAQUE IOHEXOL 350 MG/ML SOLN COMPARISON:  CT head and CTA 07/02/2019 FINDINGS: CT Brain Perfusion Findings: CBF (<30%) Volume: 06mPerfusion (Tmax>6.0s) volume: 28m328mismatch Volume: 28mL38mPECTS on noncontrast CT Head: 10 at 0842 hours today. Infarct Core: 0 mL Infarction Location:None IMPRESSION: Negative CT perfusion.  No evidence of infarct or acute ischemia. Electronically Signed   By: CharFranchot Gallo.   On: 07/02/2019 12:40   ECHOCARDIOGRAM COMPLETE  Result Date: 07/03/2019   ECHOCARDIOGRAM REPORT   Patient Name:   DR. JEFFRoselee Culvere of Exam: 07/02/2019 Medical Rec #:  [id number]          Height:       71.0 in Accession #:    [id number]         Weight:       220.9 lb Date of Birth:  12/02/1945/05/14           BSA:          2.20 m Patient Age:    73 y84rs  BP:           174/91 mmHg Patient Gender: M                      HR:           67 bpm. Exam Location:  Inpatient Procedure: 2D Echo, Cardiac Doppler and Color Doppler Indications:    Stroke 434.91 / I163.9  History:        Patient has prior history of Echocardiogram examinations, most                 recent 03/19/2019. Cardiomyopathy, CAD, Defibrillator; Risk                 Factors:Hypertension, Diabetes and Sleep Apnea. CKD. GERD.  Sonographer:    Jonelle Sidle Dance Referring Phys: [place]  1. Left ventricular ejection fraction, by visual estimation, is 55 to 60%. The left ventricle has normal function. There is mildly increased left ventricular hypertrophy.  2. Left ventricular diastolic parameters are indeterminate.  3. Global right ventricle has normal systolic function.The right ventricular size is normal.  4. The mitral valve is normal in structure. No evidence of mitral valve regurgitation.  5. The aortic valve is tricuspid. Aortic valve regurgitation is mild. No evidence of aortic valve stenosis.  6. The tricuspid valve is normal in structure.  7. The pulmonic valve was not well visualized. Pulmonic valve regurgitation is not visualized.  8. Left atrial size was normal.  9. Right atrial size was normal. 10. The inferior vena cava is normal in size with greater than 50% respiratory variability, suggesting right atrial pressure of 3 mmHg. 11. The tricuspid regurgitant velocity is 2.09 m/s, and with an assumed right atrial pressure of 3 mmHg, the estimated right ventricular systolic pressure is normal at 20.5 mmHg. 12. There is mild dilatation of the aortic root measuring 40 mm.There is dilatation of the ascending aorta measuring 39 mm FINDINGS  Left Ventricle: Left ventricular ejection fraction, by visual estimation, is 55 to 60%. The left ventricle has normal function. The left ventricle has no regional wall motion abnormalities. There is mildly increased left ventricular hypertrophy. Left ventricular diastolic parameters are indeterminate. Right Ventricle: The right ventricular size is normal. No increase in right ventricular wall thickness. Global RV systolic function is has normal systolic function. The tricuspid regurgitant velocity is 2.09 m/s, and with an assumed right atrial pressure  of 3 mmHg, the estimated right ventricular systolic pressure is normal at 20.5 mmHg. Left Atrium: Left atrial size was normal in size. Right Atrium: Right atrial size was normal in  size Pericardium: There is no evidence of pericardial effusion. Mitral Valve: The mitral valve is normal in structure. No evidence of mitral valve regurgitation. Tricuspid Valve: The tricuspid valve is normal in structure. Tricuspid valve regurgitation is mild. Aortic Valve: The aortic valve is tricuspid. Aortic valve regurgitation is mild. Aortic regurgitation PHT measures 634 msec. The aortic valve is structurally normal, with no evidence of sclerosis or stenosis. Pulmonic Valve: The pulmonic valve was not well visualized. Pulmonic valve regurgitation is not visualized. Pulmonic regurgitation is not visualized. Aorta: Aortic dilatation noted. There is mild dilatation of the aortic root measuring 40 mm. Venous: The inferior vena cava is normal in size with greater than 50% respiratory variability, suggesting right atrial pressure of 3 mmHg. IAS/Shunts: The interatrial septum was not well visualized.  LEFT VENTRICLE PLAX 2D LVIDd:         5.20  cm LVIDs:         3.50 cm LV PW:         1.30 cm LV IVS:        1.10 cm LVOT diam:     2.50 cm LV SV:         79 ml LV SV Index:   34.70 LVOT Area:     4.91 cm  RIGHT VENTRICLE             IVC RV Basal diam:  3.10 cm     IVC diam: 1.90 cm RV Mid diam:    3.20 cm RV S prime:     10.30 cm/s TAPSE (M-mode): 2.2 cm LEFT ATRIUM             Index       RIGHT ATRIUM           Index LA diam:        4.20 cm 1.91 cm/m  RA Area:     15.40 cm LA Vol (A2C):   59.4 ml 27.01 ml/m RA Volume:   30.30 ml  13.78 ml/m LA Vol (A4C):   44.6 ml 20.28 ml/m LA Biplane Vol: 51.7 ml 23.50 ml/m  AORTIC VALVE LVOT Vmax:   89.70 cm/s LVOT Vmean:  56.600 cm/s LVOT VTI:    0.204 m AI PHT:      634 msec  AORTA Ao Root diam: 4.00 cm Ao Asc diam:  3.90 cm MITRAL VALVE                         TRICUSPID VALVE MV Area (PHT): 2.99 cm              TR Peak grad:   17.5 mmHg MV PHT:        73.66 msec            TR Vmax:        209.00 cm/s MV Decel Time: 254 msec MV E velocity: 89.30 cm/s  103 cm/s  SHUNTS MV A  velocity: 104.00 cm/s 70.3 cm/s Systemic VTI:  0.20 m MV E/A ratio:  0.86        1.5       Systemic Diam: 2.50 cm  Oswaldo Milian MD Electronically signed by Oswaldo Milian MD Signature Date/Time: 07/03/2019/1:21:00 AM    Final    CT HEAD CODE STROKE WO CONTRAST  Result Date: 07/02/2019 CLINICAL DATA:  Code stroke. Neuro deficit, acute, stroke suspected. Additional history provided: Last known well 20:30. Left-sided deficits and confusion. EXAM: CT HEAD WITHOUT CONTRAST TECHNIQUE: Contiguous axial images were obtained from the base of the skull through the vertex without intravenous contrast. COMPARISON:  No pertinent prior studies available for comparison. FINDINGS: Brain: No evidence of acute intracranial hemorrhage. No demarcated cortical infarction. No evidence of intracranial mass. No midline shift or extra-axial fluid collection. Mild ill-defined hypoattenuation within the cerebral white matter is nonspecific, but consistent with chronic small vessel ischemic disease. Small symmetric focal hypodensities within the inferior basal ganglia likely reflect prominent perivascular spaces. Mild generalized parenchymal atrophy. 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 Vascular: No hyperdense vessel.  Atherosclerotic calcifications. Skull: Normal. Negative for fracture or focal lesion. Sinuses/Orbits: Visualized orbits demonstrate no acute abnormality. Mild ethmoid sinus mucosal thickening. Moderate right maxillary sinus mucosal thickening. No significant mastoid effusion. These results were communicated to Dr. Cheral Marker at 8:53 amon 1/15/2021by text page via the [facility] messaging system. IMPRESSION:  No evidence of acute intracranial abnormality. Mild generalized parenchymal atrophy and chronic small vessel ischemic disease. Paranasal sinus disease as described. Electronically Signed   By: Kellie Simmering DO   On: 07/02/2019 08:54     Labs:   Basic Metabolic  Panel: Recent Labs  Lab 07/02/19 0838 07/02/19 0840  NA 138 136  K 5.1 4.9  CL 102 100  CO2 25  --   GLUCOSE 153* 147*  BUN 26* 28*  CREATININE 1.55* 1.50*  CALCIUM 9.1  --    GFR CrCl cannot be calculated (Unknown ideal weight.). Liver Function Tests: Recent Labs  Lab 07/02/19 0838  AST 18  ALT 19  ALKPHOS 74  BILITOT 0.9  PROT 6.0*  ALBUMIN 3.7   No results for input(s): LIPASE, AMYLASE in the last 168 hours. No results for input(s): AMMONIA in the last 168 hours. Coagulation profile Recent Labs  Lab 07/02/19 0838  INR 1.0    CBC: Recent Labs  Lab 07/02/19 0838 07/02/19 0840  WBC 11.4*  --   NEUTROABS 8.8*  --   HGB 13.0 13.6  HCT 41.0 40.0  MCV 94.9  --   PLT 214  --    Cardiac Enzymes: No results for input(s): CKTOTAL, CKMB, CKMBINDEX, TROPONINI in the last 168 hours. BNP: Invalid input(s): POCBNP CBG: Recent Labs  Lab 07/02/19 1656 07/02/19 2106 07/03/19 0606 07/03/19 0701 07/03/19 1156  GLUCAP 72 91 50* 202* 171*   D-Dimer No results for input(s): DDIMER in the last 72 hours. Hgb A1c No results for input(s): HGBA1C in the last 72 hours. Lipid Profile Recent Labs    07/03/19 0243  CHOL 91  HDL 34*  LDLCALC 40  TRIG 87  CHOLHDL 2.7   Thyroid function studies Recent Labs    07/02/19 1700  TSH 2.574   Anemia work up No results for input(s): VITAMINB12, FOLATE, FERRITIN, TIBC, IRON, RETICCTPCT in the last 72 hours. Microbiology Recent Results (from the past 240 hour(s))  SARS CORONAVIRUS 2 (TAT 6-24 HRS) Nasopharyngeal Nasopharyngeal Swab     Status: None   Collection Time: 07/02/19  9:06 AM   Specimen: Nasopharyngeal Swab  Result Value Ref Range Status   SARS Coronavirus 2 NEGATIVE NEGATIVE Final    Comment: (NOTE) SARS-CoV-2 target nucleic acids are NOT DETECTED. The SARS-CoV-2 RNA is generally detectable in upper and lower respiratory specimens during the acute phase of infection. Negative results do not preclude  SARS-CoV-2 infection, do not rule out co-infections with other pathogens, and should not be used as the sole basis for treatment or other patient management decisions. Negative results must be combined with clinical observations, patient history, and epidemiological information. The expected result is Negative. Fact Sheet for Patients: SugarRoll.be Fact Sheet for Healthcare Providers: https://www.woods-mathews.com/ This test is not yet approved or cleared by the [place] FDA and  has been authorized for detection and/or diagnosis of SARS-CoV-2 by FDA under an Emergency Use Authorization (EUA). This EUA will remain  in effect (meaning this test can be used) for the duration of the COVID-19 declaration under Section 56 4(b)(1) of the Act, 21 U.S.C. section 360bbb-3(b)(1), unless the authorization is terminated or revoked sooner. Performed at [facility], [place] [address] [place], [place] [address]      Discharge Instructions:   Discharge Instructions    Ambulatory referral to Neurology   Complete by: As directed    An appointment is requested in approximately: 4-6 weeks   Ambulatory referral to Occupational Therapy  Complete by: As directed    Ambulatory referral to Physical Therapy   Complete by: As directed    Diet - low sodium heart healthy   Complete by: As directed    Diet Carb Modified   Complete by: As directed    Discharge instructions   Complete by: As directed    Asa plus plavix x 3 weeks then plavix alone Outpatient PT/OT: per physical therapy, Recommended no driving currently, supervision for medication management and potential work modifications.   Increase activity slowly   Complete by: As directed      Allergies as of 07/03/2019      Reactions   Lisinopril Swelling   angioedema      Medication List    TAKE these medications   acetaminophen 500 MG tablet Commonly known as: TYLENOL Take 1,000 mg by  mouth every 6 (six) hours as needed for moderate pain or headache.   aspirin EC 81 MG tablet Take 1 tablet (81 mg total) by mouth daily for 21 days. What changed:   medication strength  how much to take  when to take this  additional instructions   carvedilol 6.25 MG tablet Commonly known as: COREG TAKE 1&1/2 TABLETS BY MOUTH TWO TIMES DAILY What changed: See the new instructions.   clopidogrel 75 MG tablet Commonly known as: PLAVIX Take 1 tablet (75 mg total) by mouth daily. Start taking on: July 04, 2019   docusate sodium 100 MG capsule Commonly known as: Colace Take 1 capsule (100 mg total) by mouth 2 (two) times daily.   empagliflozin 10 MG Tabs tablet Commonly known as: Jardiance Take 10 mg by mouth daily.   glucose blood test strip Commonly known as: OneTouch Verio Use to check blood sugar 3 times a day.   Insulin Glargine 100 UNIT/ML Solostar Pen Commonly known as: Lantus SoloStar Inject 38 Units into the skin at bedtime. What changed: how much to take   Lancets Misc Glucose monitor to check blood sugars tid dx code E11.9 brand per insurance coverage   losartan 25 MG tablet Commonly known as: COZAAR Take 1 tablet (25 mg total) by mouth daily.   metFORMIN 500 MG 24 hr tablet Commonly known as: GLUCOPHAGE-XR Take 4 tablets (2,000 mg total) by mouth daily with breakfast. Start taking on: July 05, 2019 What changed: These instructions start on July 05, 2019. If you are unsure what to do until then, ask your doctor or other care provider.   OneTouch Verio w/Device Kit Use to check blood sugar 3 times a day.   Quercetin 250 MG Tabs Take 500 mg by mouth daily.   rosuvastatin 10 MG tablet Commonly known as: CRESTOR TAKE 1 TABLET (10 MG TOTAL) BY MOUTH DAILY.   spironolactone 25 MG tablet Commonly known as: ALDACTONE TAKE 1 TABLET BY MOUTH ONCE A DAY   Systane Balance 0.6 % Soln Generic drug: Propylene Glycol Apply 1 drop to eye daily as  needed (dry eyes).   tamsulosin 0.4 MG Caps capsule Commonly known as: FLOMAX Take 0.4 mg by mouth at bedtime.   tiZANidine 2 MG tablet Commonly known as: ZANAFLEX Take 1 tablet (2 mg total) by mouth every 8 (eight) hours as needed for muscle spasms.   Trulicity 1.5 FK/8.1EX Sopn Generic drug: Dulaglutide INJECT 1.5MG (1 PEN ) INTO THE SKIN ONCE A WEEK (REPLACE VICTOZA) What changed: See the new instructions.   Unifine Pentips 32G X 4 MM Misc Generic drug: Insulin Pen Needle USE TWICE DAILY WITH  LANTUS AND VICTOZA   vitamin C 1000 MG tablet Take 1,000 mg by mouth 2 (two) times daily.   Vitamin D 50 MCG (2000 UT) tablet Take 2,000 Units by mouth daily.   zinc gluconate 50 MG tablet Take 50 mg by mouth 2 (two) times daily.      Follow-up Information    [facility] Follow up.   Specialty: Rehabilitation Why: They will be in contact with you in the next week to schedule your therapy at the office Contact information: [address] [id number] [place] [phone]           Time coordinating discharge: 25 min  Signed:  Geradine Girt DO  [facility] 07/03/2019, 4:38 PM

## 2019-07-03 NOTE — Care Management (Signed)
Referral placed for outpatient therapies. Added to AVS. No other CM needs identified.

## 2019-07-03 NOTE — Progress Notes (Signed)
STROKE TEAM PROGRESS NOTE   HISTORY OF PRESENT ILLNESS (per record) Paul [name] is an 74 y.o. male presenting with acute onset of left sided weakness and dysarthria. He has no prior history of stroke or ICH. The neurological deficit has followed a stuttering course. First episode of neurological symptoms occurred yesterday at 1230 and lasted for about 2 hours, consisting of left hand weakness/incoordination, with difficulty typing at his computer. Also had some trouble with "getting words out". He feels that his symptoms resolved completely and that he was normal on going to bed at 2030. This AM symptoms were present on awakening, with left hand grip weakness and confusion. He had difficulty getting his clothes on and had a fall while putting on his pants, without hitting his head. He states his wife got worried and called EMS.  Has a history of defibrillator placement followed by removal in October of 2020. Stroke risk factors include cardiomyopathy, CHF, CAD, HTN, sleep apnea and DM2. He is not on a blood thinner. He takes ASA 650 mg po qd.    INTERVAL HISTORY I have personally reviewed history of presenting illness in details with the patient, electronic medical records and imaging films in PACS.  He presented with sudden onset of left hand weakness, clumsiness and slurred speech which resolved in an hour or so.  MRI shows no evidence of acute stroke.  Interestingly he says that he is noticed that he has diminished left-sided peripheral vision for quite some time.  CT angiogram shows right PCA distal occlusion but interestingly MRI does not show a stroke in the right occipital lobe but on clinical exam he clearly has left-sided partial homonymous hemianopsia.  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 [employer] He denies any prior history of atrial fibrillation strokes or  TIA  OBJECTIVE Vitals:   07/03/19 0130 07/03/19 0321 07/03/19 0626 07/03/19 0805  BP:  (!) 170/87 135/78 (!) 146/83  Pulse:  91 98 88  Resp: 18 18 19 18   Temp: 98.5 F (36.9 C) 98.7 F (37.1 C) 98.5 F (36.9 C) 98.9 F (37.2 C)  TempSrc: Oral Oral Oral Oral  SpO2:  98% 94% 96%    CBC:  Recent Labs  Lab 07/02/19 0838 07/02/19 0840  WBC 11.4*  --   NEUTROABS 8.8*  --   HGB 13.0 13.6  HCT 41.0 40.0  MCV 94.9  --   PLT 214  --     Basic Metabolic Panel:  Recent Labs  Lab 07/02/19 0838 07/02/19 0840  NA 138 136  K 5.1 4.9  CL 102 100  CO2 25  --   GLUCOSE 153* 147*  BUN 26* 28*  CREATININE 1.55* 1.50*  CALCIUM 9.1  --     Lipid Panel:     Component Value Date/Time   CHOL 91 07/03/2019 0243   TRIG 87 07/03/2019 0243   HDL 34 (L) 07/03/2019 0243   CHOLHDL 2.7 07/03/2019 0243   VLDL 17 07/03/2019 0243   LDLCALC 40 07/03/2019 0243   HgbA1c:  Lab Results  Component Value Date   HGBA1C 7.0 (H) 05/07/2019   Urine Drug Screen: No results found for: LABOPIA, COCAINSCRNUR, LABBENZ, AMPHETMU, THCU, LABBARB  Alcohol Level No results found for: [facility]  IMAGING  CT HEAD CODE STROKE WO CONTRAST 07/02/2019 IMPRESSION:  No evidence of acute intracranial abnormality. Mild generalized parenchymal atrophy and chronic  small vessel ischemic disease. Paranasal sinus disease as described.  CT Code Stroke CTA Head W/WO contrast CT Code Stroke CTA Neck W/WO contrast 07/02/2019 IMPRESSION:  1. 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 Acuity is indeterminate.  2. Moderate narrowing of the left V4 segment.  3. Atherosclerosis without significant stenosis in the neck or anterior circulation.   MR BRAIN WO CONTRAST 07/02/2019 IMPRESSION:  Negative for acute infarct. Mild chronic microvascular ischemic change in the white matter.  CT CEREBRAL PERFUSION W CONTRAST 07/02/2019 IMPRESSION: Negative CT perfusion.  No evidence  of infarct or acute ischemia.   ECHOCARDIOGRAM COMPLETE 07/03/2019 IMPRESSIONS   1. Left ventricular ejection fraction, by visual estimation, is 55 to 60%. The left ventricle has normal function. There is mildly increased left ventricular hypertrophy.   2. Left ventricular diastolic parameters are indeterminate.   3. Global right ventricle has normal systolic function.The right ventricular size is normal.   4. The mitral valve is normal in structure. No evidence of mitral valve regurgitation.   5. The aortic valve is tricuspid. Aortic valve regurgitation is mild. No evidence of aortic valve stenosis.   6. The tricuspid valve is normal in structure.   7. The pulmonic valve was not well visualized. Pulmonic valve regurgitation is not visualized.   8. Left atrial size was normal.   9. Right atrial size was normal.  10. The inferior vena cava is normal in size with greater than 50% respiratory variability, suggesting right atrial pressure of 3 mmHg.  11. The tricuspid regurgitant velocity is 2.09 m/s, and with an assumed right atrial pressure of 3 mmHg, the estimated right ventricular systolic pressure is normal at 20.5 mmHg.  12. There is mild dilatation of the aortic root measuring 40 mm.There is dilatation of the ascending aorta measuring 39 mm    ECG - SR rate 85 BPM. (See cardiology reading for complete details)   PHYSICAL EXAM Blood pressure (!) 146/83, pulse 88, temperature 98.9 F (37.2 C), temperature source Oral, resp. rate 18, SpO2 96 %. Pleasant elderly Caucasian male not in distress. . Afebrile. Head is nontraumatic. Neck is supple without bruit.    Cardiac exam no murmur or gallop. Lungs are clear to auscultation. Distal pulses are well felt. Neurological Exam ;  Awake  Alert oriented x 3. Normal speech and language.he has diminished attention, registration and recall.  He can follow simple midline and 1 and occasional two-step commands.  Eye movements full without nystagmus.fundi  were not visualized.  Visual field showed left partial homonymous hemianopsia.  Some inattention to the left on double simultaneous testing.  Hearing is normal. Palatal movements are normal. Face symmetric. Tongue midline. Normal strength, tone, reflexes and coordination.  Slight diminished fine finger movements on the left and orbits right over left upper extremity.  Normal sensation. Gait deferred.      ASSESSMENT/PLAN Mr. LAIKYN DAMAN is a 74 y.o. male with history of cardiomyopathy, VT ablation (implanted defibrillator removed Oct 2020), CHF, CAD, DVT hx, HTN, sleep apnea and DM2. presenting with stuterring deficits including left sided weakness, speech difficulties, confusion, and a fall. He did not receive IV t-PA due to late presentation (>4.5 hours from time of onset)   Possible right brain TIA: Left-sided peripheral vision loss likely from a previous small right occipital infarct from right PCA occlusion which is age-indeterminate  Resultant no new deficits.  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 Mild generalized parenchymal atrophy and chronic small vessel ischemic disease. Paranasal sinus disease as described.   CT head - not ordered  MRI head - Negative for acute infarct. Mild chronic microvascular ischemic change in the white matter.  MRA head - not ordered  CTA H&N - Right P1 occlusion with reconstituted distal vessels and no visible infarct on noncontrast CT. Acuity is indeterminate.   CT Perfusion - Negative CT perfusion.  No evidence of infarct or acute ischemia.   Carotid Doppler - CTA neck performed - carotid dopplers not indicated.  2D Echo - EF 55 - 60%. No cardiac source of emboli identified.   Sars Corona Virus 2 - negative  LDL - 40  HgbA1c - 7.0  UDS - pending  VTE prophylaxis - Lovenox Diet  Diet Order            Diet heart healthy/carb modified Room service  appropriate? Yes; Fluid consistency: Thin  Diet effective ____              aspirin 325 mg daily prior to admission, now on clopidogrel 75 mg daily  Patient counseled to be compliant with his antithrombotic medications  Ongoing aggressive stroke risk factor management  Therapy recommendations:  Outpt PT recommended  Disposition:  Pending  Hypertension  Home BP meds: Cozaar, Coreg, [place]  Current BP meds: hydralazine prn  Blood pressure somewhat high at times but within post stroke/TIA parameters . Permissive hypertension (OK if < 220/120) but gradually normalize in 5-7 days  . Long-term BP goal normotensive  Hyperlipidemia  Home Lipid lowering medication: Crestor 10 mg daily  LDL 40, goal < 70  Current lipid lowering medication: Crestor 10 mg daily  Continue statin at discharge  Diabetes  Home diabetic meds: Jardiance, Metformin, Insulin  Current diabetic meds: insulin  HgbA1c 7.0, goal < 7.0 Recent Labs    07/02/19 2106 07/03/19 0606 07/03/19 0701  GLUCAP 91 50* 202*    Other Stroke Risk Factors  Advanced age  ETOH use, advised to drink no more than 1 alcoholic beverage per day.  Obesity, There is no height or weight on file to calculate BMI., recommend weight loss, diet and exercise as appropriate   Family hx stroke (mother and father)  Coronary artery disease  Obstructive sleep apnea, on CPAP at home  Congestive Heart Failure  Cardiomyopathy  Other Active Problems  Allergy Lisinopril (angioedema)  Mild Leukocytosis - 11.4 (afebrile)  CKD - creatinine - 1.5   Hospital day # 1  I have personally obtained history,examined this patient, reviewed notes, independently viewed imaging studies, participated in medical decision making and plan of care.ROS completed by me personally and pertinent positives fully documented  I have made any additions or clarifications directly to the above note. Agree with note above.  Unusual presentation  definitely suggestive of posterior right brain stroke but MRI negative for stroke.  He still has left-sided peripheral vision loss with right PCA distal occlusion on CT angio which is probably chronic and he at some point had a occipital infarct which is no longer visible on the MRI but he still has left-sided peripheral vision deficit from that.  Given the present episode strong suspicion for cardiac source of embolism and recommend 30-day heart monitor for A. fib.  Continue aspirin Plavix for 3 weeks followed by Plavix alone.  Continue ongoing stroke work-up.  Long discussion with the patient and with Dr. Lucianne Lei and answered questions.  Greater than 50% time during  this 35-minute visit was spent on counseling and coordination of care about his TIA and left-sided vision loss.  Patient was counseled not to drive.  He voiced understanding  Antony Contras, MD Medical Director [facility] Pager: [phone] 07/03/2019 2:29 PM   To contact Stroke Continuity provider, please refer to [email or web address] After hours, contact General Neurology

## 2019-07-03 NOTE — Evaluation (Signed)
Speech Language Pathology Evaluation Patient Details Name: Paul [name] MRN: [id number] DOB: June 02, 1946 Today's Date: 07/03/2019 Time: [id number] SLP Time Calculation (min) (ACUTE ONLY): 27 min  Problem List:  Patient Active Problem List   Diagnosis Date Noted  . TIA (transient ischemic attack) 07/02/2019  . Primary osteoarthritis of right knee 05/07/2019  . Aftercare following removal or replacement of defibrillator 03/20/2019  . 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 Chronic systolic CHF (congestive heart failure) ([place]) 05/04/2014  . PVC's (premature ventricular contractions) 06/01/2013  . Essential hypertension 05/17/2013  . Cardiomyopathy, dilated, nonischemic ([place]) 10/20/2012  . Ventricular tachycardia/frequent PVCs 10/20/2012  . Controlled type 2 diabetes mellitus ([place]) 10/20/2012  . Hypogonadism male 10/20/2012   Past Medical History:  Past Medical History:  Diagnosis Date  . Aftercare following removal/replacement defibrillator    a. defibrillator removed 03/2019.  Marland Kitchen Arthritis    "thumbs, neck, knees" (06/01/2013)  . Cardiomyopathy, dilated, nonischemic ([place]) 10/20/2012  . Cataract   . Chronic systolic heart failure ([place])   . CKD (chronic kidney disease), stage III   . Coronary artery disease    stent placed  . DVT (deep vein thrombosis) in pregnancy   . 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  . Erectile dysfunction   . GERD (gastroesophageal reflux disease)   . Hearing aid worn    B/L  . Hypertension   . Hypogonadism male   . Hypovitaminosis D   . 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 NICM (nonischemic cardiomyopathy) ([place])   . Sleep apnea    cpap  . Type II diabetes mellitus ([place])   . Ventricular tachycardia ([place])    observed during stress testing, subsequent VT on EPS  . Wears glasses    Past Surgical History:  Past Surgical History:   Procedure Laterality Date  . ABLATION  06-01-2013   PVC's ablated along basal inferoseptal RV by Dr Rayann Heman  . APPENDECTOMY  1969  . CARDIAC CATHETERIZATION  04/2010  . CARDIAC DEFIBRILLATOR PLACEMENT  05/09/2010   MDT ICD implanted in [place] by Dr Ky Barban  . CATARACT EXTRACTION W/ INTRAOCULAR LENS  IMPLANT, BILATERAL Bilateral   . ICD LEAD REMOVAL N/A 03/19/2019   Procedure: ICD LEAD AND GENERATOR REMOVAL;  Surgeon: Evans Lance, MD;  Location: [place];  Service: Cardiovascular;  Laterality: N/A;  . KNEE ARTHROSCOPY Left 1984  . LUMBAR LAMINECTOMY  ~ 2004  . TONSILLECTOMY AND ADENOIDECTOMY  1972  . TOTAL KNEE ARTHROPLASTY Left 04/28/2017  . TOTAL KNEE ARTHROPLASTY Left 04/28/2017   Procedure: TOTAL KNEE ARTHROPLASTY LEFT;  Surgeon: Dorna Leitz, MD;  Location: [place];  Service: Orthopedics;  Laterality: Left;  . TOTAL KNEE ARTHROPLASTY Right 05/07/2019   Procedure: TOTAL KNEE ARTHROPLASTY;  Surgeon: Dorna Leitz, MD;  Location: [facility];  Service: Orthopedics;  Laterality: Right;  . V-TACH ABLATION N/A 06/01/2013   Procedure: V-TACH ABLATION;  Surgeon: Coralyn Mark, MD;  Location: [facility];  Service: Cardiovascular;  Laterality: N/A;  . VASECTOMY    . VENTRICULAR ABLATION SURGERY  06/01/2013   HPI:  Paul [name] is a 74 y.o. male with medical history significant of DM; OSA; chronic systolic CHF with AICD placement; hypogonadism; HTN; stage 3 CKD; recent right TKA presenting with left-sided weakness and mid confusion.CTA reveals a right P1 occlusion with reconstituted distal vessels and no visible infarct on noncontrast CT. MRI negative for acute infarct.   Assessment /  Plan / Recommendation Clinical Impression  Cognistat administered. Patient with deficits in the areas of sustained attention, short term memory, problem solving, and reasoning. Patient will benefit from 24 hour supervision following discharge as well as OP SLP f/u for facilitate improved cognitive function.      SLP Assessment  SLP Recommendation/Assessment: All further Speech Lanaguage Pathology  needs can be addressed in the next venue of care(Patient discharging home today) SLP Visit Diagnosis: Cognitive communication deficit (R41.841)    Follow Up Recommendations  Outpatient SLP          SLP Evaluation Cognition  Overall Cognitive Status: Impaired/Different from baseline Arousal/Alertness: Awake/alert Orientation Level: Oriented X4 Attention: Sustained Sustained Attention: Impaired Sustained Attention Impairment: Verbal basic Memory: Impaired Memory Impairment: Storage deficit;Retrieval deficit Awareness: Impaired Awareness Impairment: Intellectual impairment Problem Solving: Impaired Problem Solving Impairment: Verbal complex;Functional complex Executive Function: Reasoning Reasoning: Impaired Reasoning Impairment: Verbal complex;Functional complex Behaviors: Impulsive Safety/Judgment: Impaired       Comprehension  Auditory Comprehension Overall Auditory Comprehension: Appears within functional limits for tasks assessed Visual Recognition/Discrimination Discrimination: Within Function Limits Reading Comprehension Reading Status: Not tested    Expression Expression Primary Mode of Expression: Verbal Verbal Expression Overall Verbal Expression: Appears within functional limits for tasks assessed Written Expression Dominant Hand: Right   Oral / Motor  Oral Motor/Sensory Function Overall Oral Motor/Sensory Function: Within functional limits Motor Speech Overall Motor Speech: Appears within functional limits for tasks assessed   GO             Vyron Fronczak MA, CCC-SLP         Texanna Hilburn Meryl 07/03/2019, 1:50 PM

## 2019-07-03 NOTE — Progress Notes (Signed)
Occupational Therapy Evaluation Patient Details Name: Paul [name] MRN: [id number] DOB: 1945/10/29 Today's Date: 07/03/2019    History of Present Illness Paul [name] is a 74 y.o. male with medical history significant of DM; OSA; chronic systolic CHF with AICD placement; hypogonadism; HTN; stage 3 CKD; recent right TKA presenting with left-sided weakness and mid confusion.CTA reveals a right P1 occlusion with reconstituted distal vessels and no visible infarct on noncontrast CT. MRI negative for acute infarct.   Clinical Impression   PTA, pt was living at home with his wife, pt was independent with ADL/IADL and functional mobility, he was driving and works as a Engineer, drilling for [employer]. Pt currently demonstrates limitations with left visual field deficit, decreased cognition and decreased self-awareness, impaired dynamic balance that impact his safety and independence with ADL and functional mobility. Pt tangled self in IV line 3x during session requiring cues to untangle self, he ran into bed requiring minA to correct. Pt required minA overall during functional mobility with ADL for stability and safety. Due to decline in current level of function, pt would benefit from OT to address established goals to facilitate safe D/C to venue listed below. At this time, recommend outpatient follow-up. Pt with anticipated d.c this date. All additional OT needs to be addressed at next venue. OT to sign off. Thank you for referral.      Follow Up Recommendations  Outpatient OT    Equipment Recommendations  None recommended by OT    Recommendations for Other Services       Precautions / Restrictions Precautions Precautions: Fall Restrictions Weight Bearing Restrictions: No      Mobility Bed Mobility               General bed mobility comments: pt sitting in recliner upon arrival  Transfers Overall transfer level: Needs assistance Equipment used: None Transfers: Sit  to/from Stand Sit to Stand: Min guard         General transfer comment: for safety awareness, minor instability in standing intially    Balance Overall balance assessment: Mild deficits observed, not formally tested                                         ADL either performed or assessed with clinical judgement   ADL Overall ADL's : Needs assistance/impaired Eating/Feeding: Supervision/ safety   Grooming: Min guard;Standing   Upper Body Bathing: Supervision/ safety   Lower Body Bathing: Min guard;Sit to/from stand   Upper Body Dressing : Supervision/safety   Lower Body Dressing: Min guard;Sit to/from stand   Toilet Transfer: Minimal assistance;Ambulation Toilet Transfer Details (indicate cue type and reason): for safety and navigation in room;pt bumped into furniture, minA for [place] and Hygiene: Min guard;Sit to/from stand   Tub/ Banker: Medical sales representative Details (indicate cue type and reason): simulated Functional mobility during ADLs: Minimal assistance;Min guard General ADL Comments: minguard-mina for functional mobiltiy secondary to decreased safety awareness, instability, visual impairments,     Vision Baseline Vision/History: Wears glasses Wears Glasses: At all times Patient Visual Report: Diplopia(reports this is chronic) Vision Assessment?: Vision impaired- to be further tested in functional context Additional Comments: limited as pt did not have glasses;assessed pt functionally, pt bumping into objects on L side of environment, appears to have decreased attention to left side of body, head turned to R during grooming at sink  level;pt reports diplopia is chronic, will continue to further assess     Perception     Praxis      Pertinent Vitals/Pain Pain Assessment: No/denies pain     Hand Dominance Right   Extremity/Trunk Assessment Upper Extremity Assessment Upper  Extremity Assessment: Overall WFL for tasks assessed RUE Deficits / Details: Strength 5/5 LUE Deficits / Details: Strength 5/5   Lower Extremity Assessment Lower Extremity Assessment: Defer to PT evaluation RLE Deficits / Details: Strength 5/5 LLE Deficits / Details: Strength 5/5   Cervical / Trunk Assessment Cervical / Trunk Assessment: Normal   Communication Communication Communication: No difficulties   Cognition Arousal/Alertness: Awake/alert Behavior During Therapy: WFL for tasks assessed/performed Overall Cognitive Status: Impaired/Different from baseline Area of Impairment: Memory;Safety/judgement;Attention;Awareness;Problem solving                   Current Attention Level: Selective Memory: Decreased short-term memory   Safety/Judgement: Decreased awareness of deficits Awareness: Emergent Problem Solving: Difficulty sequencing;Requires verbal cues;Requires tactile cues General Comments: Pt with decreased awareness of deficits, pt reports feeling at baseline, however noted physical limitations, cognitive and visual limitations throughout;pt tangled in IV line 3x during session requriing cues from therapist to increase awareness and problem solve how to untangle self;pt bumped into furniture on L side;demonstrates functional decreased spatial awareness;Educated pt on role of occupational therapy, pt did not appear to retain understanding of OT role; required vc for problem solving finding a toothbrush at sink in basket, pt asked "with what" when prompted to brush his teeth.    General Comments       Exercises     Shoulder Instructions      Home Living Family/patient expects to be discharged to:: Private residence Living Arrangements: Spouse/significant other Available Help at Discharge: Family;Available 24 hours/day Type of Home: House Home Access: Stairs to enter [employer] of Steps: 1 step through garage no rails Entrance Stairs-Rails: None Home  Layout: Two level;Able to live on main level with bedroom/bathroom Alternate Level Stairs-Number of Steps: guest rooms upstairs   Bathroom Shower/Tub: Occupational psychologist: Standard Bathroom Accessibility: Yes   Home Equipment: Grab bars - tub/shower;Shower seat - built in;Walker - 2 wheels;Cane - single point          Prior Functioning/Environment Level of Independence: Independent        Comments: pt works full time as a Engineer, 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[employer]        OT Problem List: Decreased activity tolerance;Impaired balance (sitting and/or standing);Decreased safety awareness;Decreased cognition;Decreased knowledge of precautions      OT Treatment/Interventions: Self-care/ADL training;Energy conservation;Therapeutic activities;Cognitive remediation/compensation;Visual/perceptual remediation/compensation;Balance training;Patient/family education    OT Goals(Current goals can be found in the care plan section) Acute Rehab OT Goals Patient Stated Goal: to go back to work OT Goal Formulation: With patient Time For Goal Achievement: 07/17/19 Potential to Achieve Goals: Good  OT Frequency: Min 2X/week   Barriers to D/C:            Co-evaluation              AM-PAC OT "6 Clicks" Daily Activity     Outcome Measure Help from another person eating meals?: A Little Help from another person taking care of personal grooming?: A Little Help from another person toileting, which includes using toliet, bedpan, or urinal?: A Little Help from another person bathing (including washing, rinsing, drying)?: A Little Help from another person to put on and taking off regular upper body clothing?:  A Little Help from another person to put on and taking off regular lower body clothing?: A Little 6 Click Score: 18   End of Session Equipment Utilized During Treatment: Gait belt Nurse Communication: Mobility status  Activity Tolerance: Patient tolerated treatment  well Patient left: in chair;with call bell/phone within reach;with chair alarm set  OT Visit Diagnosis: Unsteadiness on feet (R26.81);Other abnormalities of gait and mobility (R26.89);Muscle weakness (generalized) (M62.81);Low vision, both eyes (H54.2);Other symptoms and signs involving cognitive function                Time: [id number] OT Time Calculation (min): 18 min Charges:  OT General Charges $OT Visit: 1 Visit OT Evaluation $OT Eval Moderate Complexity: [place] OTR/L Acute Rehabilitation Services Office: [place] 07/03/2019, 1:02 PM

## 2019-07-06 ENCOUNTER — Other Ambulatory Visit: Payer: Self-pay | Admitting: Cardiology

## 2019-07-06 ENCOUNTER — Telehealth: Payer: Self-pay | Admitting: *Deleted

## 2019-07-06 DIAGNOSIS — G459 Transient cerebral ischemic attack, unspecified: Secondary | ICD-10-CM

## 2019-07-06 NOTE — Telephone Encounter (Signed)
Patient enrolled for [employer] to ship a 30 day cardiac event monitor to his home.   

## 2019-07-07 ENCOUNTER — Other Ambulatory Visit: Payer: Self-pay

## 2019-07-07 ENCOUNTER — Other Ambulatory Visit: Payer: Self-pay | Admitting: Adult Health

## 2019-07-07 ENCOUNTER — Ambulatory Visit: Payer: BC Managed Care – PPO | Admitting: Physical Therapy

## 2019-07-07 ENCOUNTER — Ambulatory Visit: Payer: BC Managed Care – PPO | Attending: Internal Medicine | Admitting: Occupational Therapy

## 2019-07-07 ENCOUNTER — Encounter: Payer: Self-pay | Admitting: Occupational Therapy

## 2019-07-07 DIAGNOSIS — R262 Difficulty in walking, not elsewhere classified: Secondary | ICD-10-CM | POA: Insufficient documentation

## 2019-07-07 DIAGNOSIS — R2681 Unsteadiness on feet: Secondary | ICD-10-CM

## 2019-07-07 DIAGNOSIS — G459 Transient cerebral ischemic attack, unspecified: Secondary | ICD-10-CM

## 2019-07-07 DIAGNOSIS — M6281 Muscle weakness (generalized): Secondary | ICD-10-CM

## 2019-07-07 DIAGNOSIS — R278 Other lack of coordination: Secondary | ICD-10-CM

## 2019-07-07 DIAGNOSIS — Z9189 Other specified personal risk factors, not elsewhere classified: Secondary | ICD-10-CM

## 2019-07-07 NOTE — Therapy (Signed)
[facility] [address] [place], [place], [address] Phone: [phone]   Fax:  [phone]  Physical Therapy Evaluation  Patient Details  Name: Paul [name] MRN: [id number] Date of Birth: 1946/01/02 Referring Provider (PT): Frann Rider, NP (referred by hospital physician; pt does not have PCP)   Encounter Date: 07/07/2019  PT End of Session - 07/07/19 1153    Visit Number  1    Number of Visits  1   eval only   Authorization Type  [employer] - 2021    PT Start Time  1105    PT Stop Time  1140    PT Time Calculation (min)  35 min       Past Medical History:  Diagnosis Date  . Aftercare following removal/replacement defibrillator    a. defibrillator removed 03/2019.  Marland Kitchen Arthritis    "thumbs, neck, knees" (06/01/2013)  . Cardiomyopathy, dilated, nonischemic ([place]) 10/20/2012  . Cataract   . Chronic systolic heart failure ([place])   . CKD (chronic kidney disease), stage III   . Coronary artery disease    stent placed  . DVT (deep vein thrombosis) in pregnancy   . 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  . Erectile dysfunction   . GERD (gastroesophageal reflux disease)   . Hearing aid worn    B/L  . Hypertension   . Hypogonadism male   . Hypovitaminosis D   . 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 NICM (nonischemic cardiomyopathy) ([place])   . Sleep apnea    cpap  . Type II diabetes mellitus ([place])   . Ventricular tachycardia ([place])    observed during stress testing, subsequent VT on EPS  . Wears glasses     Past Surgical History:  Procedure Laterality Date  . ABLATION  06-01-2013   PVC's ablated along basal inferoseptal RV by Dr Rayann Heman  . APPENDECTOMY  1969  . CARDIAC CATHETERIZATION  04/2010  . CARDIAC DEFIBRILLATOR PLACEMENT  05/09/2010   MDT ICD implanted in [place] by Dr Ky Barban  . CATARACT EXTRACTION W/ INTRAOCULAR LENS  IMPLANT, BILATERAL  Bilateral   . ICD LEAD REMOVAL N/A 03/19/2019   Procedure: ICD LEAD AND GENERATOR REMOVAL;  Surgeon: Evans Lance, MD;  Location: [facility];  Service: Cardiovascular;  Laterality: N/A;  . KNEE ARTHROSCOPY Left 1984  . LUMBAR LAMINECTOMY  ~ 2004  . TONSILLECTOMY AND ADENOIDECTOMY  1972  . TOTAL KNEE ARTHROPLASTY Left 04/28/2017  . TOTAL KNEE ARTHROPLASTY Left 04/28/2017   Procedure: TOTAL KNEE ARTHROPLASTY LEFT;  Surgeon: Dorna Leitz, MD;  Location: [facility];  Service: Orthopedics;  Laterality: Left;  . TOTAL KNEE ARTHROPLASTY Right 05/07/2019   Procedure: TOTAL KNEE ARTHROPLASTY;  Surgeon: Dorna Leitz, MD;  Location: [facility];  Service: Orthopedics;  Laterality: Right;  . V-TACH ABLATION N/A 06/01/2013   Procedure: V-TACH ABLATION;  Surgeon: Coralyn Mark, MD;  Location: [facility];  Service: Cardiovascular;  Laterality: N/A;  . VASECTOMY    . VENTRICULAR ABLATION SURGERY  06/01/2013    There were no vitals filed for this visit.   Subjective Assessment - 07/07/19 1106    Subjective  Pt reported L hand numbness, inability to type at work.  Went to hospital but nothing seen on imaging - diagnosed with TIA.  Is going to wear a cardiac monitor to assess cardiac function as possible cause of TIA.  Does not feel any different since TIA.  Pertinent History  R TKR 6 weeks ago, DM with neuropathy; OSA; chronic systolic CHF with AICD placement; hypogonadism; HTN; and stage 3 CKD, and Left-sided peripheral vision loss and diplopia x 10 years    Patient Stated Goals  To get back to work    Currently in Pain?  No/denies         [facility] PT Assessment - 07/07/19 1109      Assessment   Medical Diagnosis  Neuro suspects PCA CVA (MRI neg), TIA    Referring Provider (PT)  Frann Rider, NP (referred by hospital physician; pt does not have PCP)    Onset Date/Surgical Date  07/02/19    Hand Dominance  Right    Next MD Visit  Neurology - on Tuesday    Prior Therapy  acute care PT, OT, ST       Precautions   Precautions  Other (comment)    Precaution Comments  R TKR 6 weeks ago, DM with neuropathy; OSA; chronic systolic CHF with AICD placement; hypogonadism; HTN; and stage 3 CKD, and Left-sided peripheral vision loss and diplopia x 10 years      Restrictions   Weight Bearing Restrictions  No    Other Position/Activity Restrictions  recent R TKA but no precautions      Balance Screen   Has the patient fallen in the past 6 months  No    Has the patient had a decrease in activity level because of a fear of falling?   No    Is the patient reluctant to leave their home because of a fear of falling?   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, recent TKA     Sensation   Light Touch  Appears Intact    Hot/Cold  Appears Intact    Proprioception  Appears Intact    Additional Comments  reports neuropathy due to DM      Coordination   Gross Motor Movements are Fluid and Coordinated  Yes    Heel Shin Test  WFL      ROM / Strength   AROM / PROM / Strength  Strength      Strength   Overall Strength   Deficits    Overall Strength Comments  RLE recent TKA; mild L hip flexor weakness      Ambulation/Gait  Ambulation/Gait  Yes    Ambulation/Gait Assistance  7: Independent    Ambulation Distance (Feet)  230 Feet    Assistive device  None    Gait Pattern  Step-through pattern;Decreased stance time - right;Decreased stride length   intermittent shuffling   Ambulation Surface  Level;Indoor    Stairs  Yes    Stairs Assistance  6: Modified independent (Device/Increase time)    Stair Management Technique  Two rails;Alternating pattern;Forwards    Number of Stairs  8    Height of Stairs  6    Ramp  7: Independent    Curb  7: Independent      Balance   Balance Assessed  Yes      Static Standing Balance   Static Standing - Balance Support  No upper extremity supported    Static Standing - Level of Assistance  5: Stand by assistance    Static Standing - Comment/# of Minutes  Standing on foam with feet together EO and EC x 30 seconds, minimal sway      Standardized Balance Assessment   Standardized Balance Assessment  Berg Balance Test      Berg Balance Test   Sit to Stand  Able to stand without using hands and stabilize independently    Standing Unsupported  Able to stand safely 2 minutes    Sitting with Back Unsupported but Feet Supported on Floor or Stool  Able to sit safely and securely 2 minutes    Stand to Sit  Sits safely with minimal use of hands    Transfers  Able to transfer safely, minor use of hands    Standing Unsupported with Eyes Closed  Able to stand 10 seconds safely    Standing Unsupported with Feet Together  Able to place feet together independently and stand 1 minute safely    From Standing, Reach Forward with Outstretched Arm  Can reach confidently >25 cm (10")    From Standing Position, Pick up Object from Floor  Able to pick up shoe safely and easily    From Standing Position, Turn to Look Behind Over each Shoulder  Looks behind from both sides and weight shifts  well    Turn 360 Degrees  Able to turn 360 degrees safely in 4 seconds or less    Standing Unsupported, Alternately Place Feet on Step/Stool  Able to stand independently and safely and complete 8 steps in 20 seconds    Standing Unsupported, One Foot in Front  Able to place foot tandem independently and hold 30 seconds    Standing on One Leg  Able to lift leg independently and hold equal to or more than 3 seconds    Total Score  54    Berg comment:  54/56      Functional Gait  Assessment   Gait assessed   Yes    Gait Level Surface  Walks 20 ft in less than 5.5 sec, no assistive devices, good speed, no evidence for imbalance, normal gait pattern, deviates no more than 6 in outside of the 12 in walkway width.    Change in Gait Speed  Able to change speed, demonstrates mild gait deviations, deviates 6-10 in outside of the 12 in walkway width, or no gait deviations, unable to achieve a major change in velocity, or uses a change in velocity, or uses an assistive device.    Gait with Horizontal Head Turns  Performs head turns smoothly with no change in gait. Deviates no more  than 6 in outside 12 in walkway width    Gait with Vertical Head Turns  Performs head turns with no change in gait. Deviates no more than 6 in outside 12 in walkway width.    Gait and Pivot Turn  Pivot turns safely within 3 sec and stops quickly with no loss of balance.    Step Over Obstacle  Is able to step over 2 stacked shoe boxes taped together (9 in total height) without changing gait speed. No evidence of imbalance.    Gait with Narrow Base of Support  Ambulates 7-9 steps.    Gait with Eyes Closed  Walks 20 ft, no assistive devices, good speed, no evidence of imbalance, normal gait pattern, deviates no more than 6 in outside 12 in walkway width. Ambulates 20 ft in less than 7 sec.    Ambulating Backwards  Walks 20 ft, uses assistive device, slower speed, mild gait deviations, deviates 6-10 in outside 12 in walkway width.     Steps  Alternating feet, must use rail.    Total Score  26    FGA comment:  26/30 mild impairments also due to recent R TKA                Objective measurements completed on examination: See above findings.              PT Education - 07/07/19 1153    Education Details  clinical findings, offered PT to address mild balance impairments, mild strength impairments to assist pt in returning to work - pt declined services at this time    Person(s) Educated  Patient    Methods  Explanation    Comprehension  Verbalized understanding                  Plan - 07/07/19 1154    Clinical Impression Statement  Pt is a 74 year old male referred to Neuro OPPT for evaluation of L sided weakness after TIA.  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 The following deficits were noted during pt's exam: impaired L hip strength, impaired balance and difficulty with walking but based on pt's BERG and FGA scores pt is at low risk for falls.  PT offered pt follow up visits to address balance, strength and gait impairments to assist pt in returning to work but pt feels he is back to his baseline and has been walking his dog outside down to and from [place] (uphill) without any difficulty.  Pt does not feel he requires therapy services at this time.  No follow up scheduled at this time.    Personal Factors and Comorbidities  Comorbidity 3+;Profession    Comorbidities  R TKR 6 weeks ago, DM with neuropathy; OSA; chronic systolic CHF with AICD placement; hypogonadism; HTN; and stage 3 CKD, and Left-sided peripheral vision loss and diplopia x 10 years    Examination-Activity Limitations  Locomotion Level     Examination-Participation Restrictions  Driving    Stability/Clinical Decision Making  Stable/Uncomplicated    Clinical Decision Making  Low    PT Frequency  One time visit    PT Duration  Other (comment)   one visit for eval only   PT Treatment/Interventions  Other (comment)   eval only   Consulted and Agree with Plan of Care  Patient       Patient will benefit from skilled therapeutic intervention in order to improve the following deficits and impairments:  Decreased balance, Difficulty walking, Decreased strength  Visit Diagnosis: Muscle weakness (generalized)  Unsteadiness on feet  Difficulty in walking, not elsewhere classified     Problem List Patient Active Problem List   Diagnosis Date Noted  . TIA (transient ischemic attack) 07/02/2019  . Primary osteoarthritis of right knee 05/07/2019  . Aftercare following removal or replacement of defibrillator 03/20/2019  . 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 Chronic systolic CHF (congestive heart failure) ([place]) 05/04/2014  . PVC's (premature ventricular contractions) 06/01/2013  . Essential hypertension 05/17/2013  . Cardiomyopathy, dilated, nonischemic ([place]) 10/20/2012  . Ventricular tachycardia/frequent PVCs 10/20/2012  . Controlled type 2 diabetes mellitus ([place]) 10/20/2012  . Hypogonadism male 10/20/2012    Rico Junker, PT, DPT 07/07/19    12:07 PM    [facility] [address] [place], [place], [address] Phone: [phone]   Fax:  [phone]  Name: Paul [name] MRN: [id number] Date of Birth: 08-19-1945

## 2019-07-07 NOTE — Therapy (Signed)
[facility] [address] [place], [place], [address] Phone: [phone]   Fax:  [phone]  Occupational Therapy Evaluation  Patient Details  Name: Paul [name] MRN: [id number] Date of Birth: [birth year] Referring Provider (OT): Frann Rider   Encounter Date: 07/07/2019  OT End of Session - 07/07/19 1841    Visit Number  1    Number of Visits  1    Authorization Type  BCBS, TRICARE, Medicare    OT Start Time  1022    OT Stop Time  1100    OT Time Calculation (min)  38 min    Activity Tolerance  Patient tolerated treatment well       Past Medical History:  Diagnosis Date  . Aftercare following removal/replacement defibrillator    a. defibrillator removed 03/2019.  Marland Kitchen Arthritis    "thumbs, neck, knees" (06/01/2013)  . Cardiomyopathy, dilated, nonischemic ([place]) 10/20/2012  . Cataract   . Chronic systolic heart failure ([place])   . CKD (chronic kidney disease), stage III   . Coronary artery disease    stent placed  . DVT (deep vein thrombosis) in pregnancy   . 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  . Erectile dysfunction   . GERD (gastroesophageal reflux disease)   . Hearing aid worn    B/L  . Hypertension   . Hypogonadism male   . Hypovitaminosis D   . 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 NICM (nonischemic cardiomyopathy) ([place])   . Sleep apnea    cpap  . Type II diabetes mellitus ([place])   . Ventricular tachycardia ([place])    observed during stress testing, subsequent VT on EPS  . Wears glasses     Past Surgical History:  Procedure Laterality Date  . ABLATION  06-01-2013   PVC's ablated along basal inferoseptal RV by Dr Rayann Heman  . APPENDECTOMY  1969  . CARDIAC CATHETERIZATION  04/2010  . CARDIAC DEFIBRILLATOR PLACEMENT  05/09/2010   MDT ICD implanted in [place] by Dr Ky Barban  . CATARACT EXTRACTION W/ INTRAOCULAR LENS  IMPLANT,  BILATERAL Bilateral   . ICD LEAD REMOVAL N/A 03/19/2019   Procedure: ICD LEAD AND GENERATOR REMOVAL;  Surgeon: Evans Lance, MD;  Location: [facility];  Service: Cardiovascular;  Laterality: N/A;  . KNEE ARTHROSCOPY Left 1984  . LUMBAR LAMINECTOMY  ~ 2004  . TONSILLECTOMY AND ADENOIDECTOMY  1972  . TOTAL KNEE ARTHROPLASTY Left 04/28/2017  . TOTAL KNEE ARTHROPLASTY Left 04/28/2017   Procedure: TOTAL KNEE ARTHROPLASTY LEFT;  Surgeon: Dorna Leitz, MD;  Location: [facility];  Service: Orthopedics;  Laterality: Left;  . TOTAL KNEE ARTHROPLASTY Right 05/07/2019   Procedure: TOTAL KNEE ARTHROPLASTY;  Surgeon: Dorna Leitz, MD;  Location: [facility];  Service: Orthopedics;  Laterality: Right;  . V-TACH ABLATION N/A 06/01/2013   Procedure: V-TACH ABLATION;  Surgeon: Coralyn Mark, MD;  Location: [facility];  Service: Cardiovascular;  Laterality: N/A;  . VASECTOMY    . VENTRICULAR ABLATION SURGERY  06/01/2013    There were no vitals filed for this visit.  Subjective Assessment - 07/07/19 1025    Subjective   I don't have anything wrong with me - I didn't have a stroke just a TIA    Patient is accompanied by:  Family member   wife out in care   Pertinent History  07/01/2019 - pt with 2 hour window of L sided weakness, confusion, slurred speech. on 07/02/2019  second episode that persisted. Neuro suspects R PCA CVA even tho MRI does not show it  PMH:    Patient Stated Goals  I need you to clear me to go back to work  - there isn't anything I need to work on.    Currently in Pain?  No/denies        [facility] OT Assessment - 07/07/19 0001      Assessment   Medical Diagnosis  Neuro suspects PCA CVA (MRI neg), TIA    Referring Provider (OT)  Frann Rider    Onset Date/Surgical Date  07/01/19   admittted to hospital on 07/02/2019 after second episode   Hand Dominance  Right    Next MD Visit  --   next Tuesday with neuro   Prior Therapy  acute PT, OT and ST      Restrictions   Weight Bearing Restrictions  No       Balance Screen   Has the patient fallen in the past 6 months  No   pt unsteady in waiting room; has PT eval today     Home  Environment   Family/patient expects to be discharged to:  Private residence    Living Arrangements  Spouse/significant other    Type of [place]  Two level   lives primarily on [place]  Full time employment    Vocation Requirements  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 Pt denies any deficits at all       IADL   Shopping  Needs to be accompanied on any shopping trip    Light Housekeeping  --   pt reports he took down christmas tree   Meal Prep  Able to complete simple warm meal prep    Community Mobility  Relies on family or friends for transportation    Medication Management  Is responsible for taking medication in correct dosages at correct time      Mobility   Mobility Status  Independent    Mobility Status Comments  pt denies any balance deficits however was unsteady in waiting room and acute PT and OT report balance deficits.       Written Expression   Dominant Hand  Right      Vision - History   Baseline Vision  Wears glasses all the time    Additional Comments  Pt denies any visual changes - acute OT states pt has L inferior visual field cut.       Vision Assessment    Comment  Assessed for visual field cuts in clinic and no evidence  of any deficit however given that this is clearly documented in acute care notes, strongly suggested pt seek out neuro opthamology appt prior to seeking MD clearance to drive. Pt provided with referral infromattion and verbalized understanding and stated he would follow up.       Activity Tolerance   Activity Tolerance  Endurance does not limit participation in activity    Activity Tolerance Comments  Pt denies any issues with endurance      Cognition   Mini Mental State Exam   Pt reports no changes in cognition however acute care therapy notes indicate evidence of cognitive deficits and wife and work colleagues all report confusion and altered mental status.  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 After discussion, pt did agree and MD order has been obtained. Will ask front office to schedule ST eval asap.      Cognition Comments  Pt denies any cognitive changes however acute PT, OT and ST all report deficits in attention, organization, sequencing, problem solving and insight. To be further assessed.       Sensation   Light Touch  Appears Intact    Hot/Cold  Appears Intact    Proprioception  Appears Intact      Coordination   Gross Motor Movements are Fluid and Coordinated  Yes    Fine Motor Movements are Fluid and Coordinated  Yes    9 Hole Peg Test  Right;Left    Right 9 Hole Peg Test  20.59    Left 9 Hole Peg Test  29.6    Other  mild incoordination in L non dominant hand per 9 hole peg - pt denies any functional coordination issues.      Strength   Overall Strength  Within functional limits for tasks performed    Overall Strength Comments  BUE"s      Hand Function   Right Hand Grip (lbs)  75    Left Hand Grip (lbs)  70                           OT Long Term Goals - 07/07/19 1836      OT LONG TERM GOAL #1    Title  n/a no OT follow up            Plan - 07/07/19 1836    Clinical Impression Statement  Pt is a 74 year old male s/p TIA and neuro suspects small  R PCA CVA despite negative MRI.  PMH:  CKD, DVT, PVC's, CHF, CAD, HTN, DM, sleep apnea. Pt today presents with mild incoordination of LUE (denies any functional issues).  Pt at this time does appear to require any follow up OT.  Did strongly recommend ST eval for cogntion and neuro eye exam prior to asking for MD clearance for driving. After discussion, pt agreed to both.  No further OT indicated at this time.    OT Occupational Profile and History  Detailed Assessment- Review of Records and additional review of physical, cognitive, psychosocial history related to current functional performance    OT Frequency  --   n/a no OT follow up   Plan  no fruther OT follow at this time    Consulted and Agree with Plan of Care  Patient       Patient will benefit from skilled therapeutic intervention in order to improve the following deficits and  impairments:           Visit Diagnosis: Other lack of coordination    Problem List Patient Active Problem List   Diagnosis Date Noted  . TIA (transient ischemic attack) 07/02/2019  . Primary osteoarthritis of right knee 05/07/2019  . Aftercare following removal or replacement of defibrillator 03/20/2019  . 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 Chronic systolic CHF (congestive heart failure) ([facility]) 05/04/2014  . PVC's (premature ventricular contractions) 06/01/2013  . Essential hypertension 05/17/2013  . Cardiomyopathy, dilated, nonischemic ([facility]) 10/20/2012  . Ventricular tachycardia/frequent PVCs 10/20/2012  . Controlled type 2 diabetes mellitus ([place]) 10/20/2012  . Hypogonadism male 10/20/2012    Quay Burow , OTR/L 07/07/2019, [facility] [address] [place], [place], [address] Phone: [phone]   Fax:  [phone]  Name: Paul [name] MRN: [id number] Date of Birth: 10-Aug-1945

## 2019-07-14 ENCOUNTER — Other Ambulatory Visit (HOSPITAL_COMMUNITY): Payer: Self-pay

## 2019-07-14 ENCOUNTER — Other Ambulatory Visit: Payer: Self-pay | Admitting: Internal Medicine

## 2019-07-14 ENCOUNTER — Encounter: Payer: Self-pay | Admitting: Internal Medicine

## 2019-07-14 ENCOUNTER — Ambulatory Visit (INDEPENDENT_AMBULATORY_CARE_PROVIDER_SITE_OTHER): Payer: BC Managed Care – PPO

## 2019-07-14 DIAGNOSIS — G459 Transient cerebral ischemic attack, unspecified: Secondary | ICD-10-CM

## 2019-07-14 DIAGNOSIS — I4891 Unspecified atrial fibrillation: Secondary | ICD-10-CM

## 2019-07-14 MED ORDER — SPIRONOLACTONE 25 MG PO TABS: 25.0000 mg | ORAL_TABLET | Freq: Every day | ORAL | 3 refills | Status: DC

## 2019-07-14 MED ORDER — LOSARTAN POTASSIUM 25 MG PO TABS: 25.0000 mg | ORAL_TABLET | Freq: Every day | ORAL | 3 refills | Status: DC

## 2019-07-14 MED ORDER — CARVEDILOL 6.25 MG PO TABS: ORAL_TABLET | ORAL | 3 refills | Status: DC

## 2019-07-14 MED FILL — TRULICITY 1.5 MG/0.5 ML PEN: 1.5 | 28 days supply | Qty: 2 | Fill #3

## 2019-07-14 MED FILL — metFORMIN HCL ER 500 MG TB2: 500 | 90 days supply | Qty: 360 | Fill #0

## 2019-07-16 ENCOUNTER — Other Ambulatory Visit: Payer: Self-pay

## 2019-07-16 MED ORDER — TRULICITY 1.5 MG/0.5ML ~~LOC~~ SOAJ: SUBCUTANEOUS | 3 refills | Status: DC

## 2019-07-16 MED ORDER — UNIFINE PENTIPS 32G X 4 MM MISC: 11 refills | Status: DC

## 2019-07-16 MED ORDER — ONETOUCH VERIO VI STRP: ORAL_STRIP | 12 refills | Status: DC

## 2019-07-18 ENCOUNTER — Telehealth: Payer: Self-pay | Admitting: Physician Assistant

## 2019-07-18 NOTE — Telephone Encounter (Signed)
Paged by Lattie Haw of [facility] [phone]) regarding new onset of atrial flutter. Instructed to send strip to cardiology office for review. Patient recently had TIA.   Will need earlier visit with afib clinic or Dr. Tanna Furry office once heart monitor report arrive.   Aflutter started around11:45AM Central standard time

## 2019-07-19 ENCOUNTER — Other Ambulatory Visit (HOSPITAL_COMMUNITY): Payer: Self-pay

## 2019-07-19 ENCOUNTER — Telehealth: Payer: Self-pay | Admitting: Internal Medicine

## 2019-07-19 MED ORDER — RIVAROXABAN 20 MG PO TABS: 20.0000 mg | ORAL_TABLET | Freq: Every day | ORAL | 3 refills | Status: DC

## 2019-07-19 MED ORDER — ASPIRIN EC 81 MG PO TBEC: 81.0000 mg | DELAYED_RELEASE_TABLET | Freq: Every day | ORAL | 0 refills | Status: DC

## 2019-07-19 NOTE — Telephone Encounter (Signed)
Patient requests to be called at ph# [phone] to be advised if our office can supply patient with a sample of Lantus (patient is having trouble with [facility]-patient has been out of Lantus for a week and does not expect to recieve his RX from [facility] until next week)

## 2019-07-19 NOTE — Telephone Encounter (Signed)
Monitor showing AF. Pt has recent hx of TIA.   Pt states he was asymptomatic at the time of the alert and currently.  Per DOD, Dr. Harrington Challenger, pt is to begin Xarelto, 20mg  qd. Repeat BMP this week. Stop plavix when beginning Xarelto. Stop ASA 5 days after beginning Xarelto.  Pt prefers to see Dr. Caryl Comes for follow up. He saw Dr. Lovena Le in the past, but only for device extraction. Follow up appt has been made for 2/17. He will have his BMP drawn at work (he works for [employer]) and have them faxed Attn: Dr. Caryl Comes.  He will be by the office for samples today.  He has verbalized understanding and had no additional questions at this time.   Monitor alert has been scanned in.

## 2019-07-19 NOTE — Addendum Note (Signed)
Addended by: Dollene Primrose on: 07/19/2019 12:24 PM   Modules accepted: Orders

## 2019-07-20 ENCOUNTER — Ambulatory Visit: Payer: BC Managed Care – PPO | Attending: Internal Medicine | Admitting: Speech Pathology

## 2019-07-20 ENCOUNTER — Ambulatory Visit (INDEPENDENT_AMBULATORY_CARE_PROVIDER_SITE_OTHER): Payer: BC Managed Care – PPO | Admitting: Adult Health

## 2019-07-20 ENCOUNTER — Other Ambulatory Visit: Payer: Self-pay

## 2019-07-20 ENCOUNTER — Encounter: Payer: Self-pay | Admitting: Speech Pathology

## 2019-07-20 ENCOUNTER — Encounter: Payer: Self-pay | Admitting: Adult Health

## 2019-07-20 VITALS — BP 141/78 | HR 97 | Temp 97.5°F | Ht 71.0 in | Wt 209.0 lb

## 2019-07-20 DIAGNOSIS — G459 Transient cerebral ischemic attack, unspecified: Secondary | ICD-10-CM

## 2019-07-20 DIAGNOSIS — E785 Hyperlipidemia, unspecified: Secondary | ICD-10-CM | POA: Diagnosis not present

## 2019-07-20 DIAGNOSIS — I4891 Unspecified atrial fibrillation: Secondary | ICD-10-CM

## 2019-07-20 DIAGNOSIS — E119 Type 2 diabetes mellitus without complications: Secondary | ICD-10-CM | POA: Diagnosis not present

## 2019-07-20 DIAGNOSIS — R41841 Cognitive communication deficit: Secondary | ICD-10-CM | POA: Diagnosis not present

## 2019-07-20 DIAGNOSIS — I10 Essential (primary) hypertension: Secondary | ICD-10-CM | POA: Diagnosis not present

## 2019-07-20 DIAGNOSIS — Z794 Long term (current) use of insulin: Secondary | ICD-10-CM

## 2019-07-20 NOTE — Progress Notes (Signed)
[facility] [address] [place]. [place] [address] [phone]       HOSPITAL FOLLOW UP NOTE  [name] Date of Birth:  Nov 29, 1945 Medical Record Number:  [id number]   Reason for Referral:  hospital stroke follow up    CHIEF COMPLAINT:  Chief Complaint  Patient presents with  . Hospitalization Follow-up    Alone. Treatment room. No new concerns at this time. He stated that he would like to go back to work.     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  History obtained from patient and chart review. Reviewed all radiology images and labs personally.  [name] with history of cardiomyopathy, VT ablation (implanted defibrillator removed Oct 2020), CHF, CAD, DVT hx, HTN, recent right TKR 6 weeks prior, sleep apnea on CPAP, and DM2 presented on 07/01/2018 with stuterring deficits including left sided weakness, speech difficulties, confusion, and a fall.   Evaluated by stroke team and Dr. 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  He did not receive IV t-PA due to late presentation (>4.5 hours from time of onset).  MRI and CT head did not show acute abnormality and fell at some point had occipital infarct which is no longer visible on MRI with right PCA distal occlusion and ongoing left-sided peripheral vision deficit.  CTA head/neck showed right P1 occlusion with reconstituted distal vessels and no visible infarct on noncontrast CT with acuity indeterminate.  CT perfusion negative.  2D echo showed an EF of 55 to 60% without cardiac source of embolus identified.  COVID-19 negative.  Recommended  30-day cardiac event monitor outpatient due to strong suspicion for cardiac source of embolism and to rule out atrial fibrillation.  Previously on aspirin and recommended DAPT for 3 weeks then Plavix alone.  History of HTN stable and restarted home meds Cozaar, Coreg and Aldactone.  LDL 40 and recommend continuation of Crestor 10 mg daily. Controlled DM with A1c 7.0 and ongoing use of Jardiance, Metformin and insulin.  Other stroke risk factors include advanced age, EtOH use, obesity, family history of stroke, CAD, OSA on CPAP, CHF and likely prior stroke.  Other active problems include allergy to lisinopril, mild leukocytosis and CKD.  Evaluated by therapies and recommended outpatient PT/OT due to residual deficits of impaired left hip strength, impaired balance and difficulty with ambulation and discharged home in stable condition on 07/03/2019.  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 He states he has recovered well without new residual visual impairment, cognitive concerns or imbalance. He was evaluated by PT/OT which has since been completed.  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  He also had evaluation last week with Dr. Katy Fitch for HVF testing which patient reports was normal and was released back to driving.  He has returned to driving without difficulty.  Order placed for ST evaluation request OT due to concerns during admission regarding cognitive impairment.  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  Unfortunately, he has not been contacted in regards to speech/cognitive evaluation but is agreeable to proceeding with evaluation and will  schedule visit today after today's appointment.  He is eager to return to work and states he has spoke to his supervisor and plans on having assistance/supervision and additional help upon initially returning.  He primarily works with [employer] and [employer].  Evidence of atrial fibrillation on cardiac monitor therefore was initiated on Xarelto by cardiology and discontinuing Plavix.  First dose of Xarelto was yesterday and recommended to continue aspirin for 5 days after starting and then discontinuing.  He has continued on Xarelto without bleeding or bruising.  Continues on Crestor without myalgias.  Blood pressure today 141/78.  Denies new or worsening stroke/TIA symptoms.     ROS:   14 system review of systems performed and negative with exception of chronic diplopia  PMH:  Past Medical History:  Diagnosis Date  . Aftercare following removal/replacement defibrillator    a. defibrillator removed 03/2019.  Marland Kitchen Arthritis    "thumbs, neck, knees" (06/01/2013)  . Cardiomyopathy, dilated, nonischemic ([place]) 10/20/2012  . Cataract   . Chronic systolic heart failure ([place])   . CKD (chronic kidney disease), stage III   . Coronary artery disease    stent placed  . DVT (deep vein thrombosis) in pregnancy   . 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  . Erectile dysfunction   . GERD (gastroesophageal reflux disease)   . Hearing aid worn    B/L  . Hypertension   . Hypogonadism male   . Hypovitaminosis D   . 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 NICM (nonischemic cardiomyopathy) ([place])   . Sleep apnea    cpap  . Type II diabetes mellitus ([place])   . Ventricular tachycardia ([place])    observed during stress testing, subsequent VT on EPS  . Wears glasses     PSH:  Past Surgical History:  Procedure Laterality Date  . ABLATION  06-01-2013   PVC's ablated along basal inferoseptal RV by Dr Rayann Heman  . APPENDECTOMY  1969  . CARDIAC  CATHETERIZATION  04/2010  . CARDIAC DEFIBRILLATOR PLACEMENT  05/09/2010   MDT ICD implanted in [place] by Dr Ky Barban  . CATARACT EXTRACTION W/ INTRAOCULAR LENS  IMPLANT, BILATERAL Bilateral   . ICD LEAD REMOVAL N/A 03/19/2019   Procedure: ICD LEAD AND GENERATOR REMOVAL;  Surgeon: Evans Lance, MD;  Location: [place];  Service: Cardiovascular;  Laterality: N/A;  . KNEE ARTHROSCOPY Left 1984  . LUMBAR LAMINECTOMY  ~ 2004  . TONSILLECTOMY AND ADENOIDECTOMY  1972  . TOTAL KNEE ARTHROPLASTY Left 04/28/2017  . TOTAL KNEE ARTHROPLASTY Left 04/28/2017   Procedure: TOTAL KNEE ARTHROPLASTY LEFT;  Surgeon: Dorna Leitz, MD;  Location: [place];  Service: Orthopedics;  Laterality: Left;  . TOTAL KNEE ARTHROPLASTY Right 05/07/2019   Procedure: TOTAL KNEE ARTHROPLASTY;  Surgeon: Dorna Leitz, MD;  Location: [facility];  Service: Orthopedics;  Laterality: Right;  . V-TACH ABLATION N/A 06/01/2013   Procedure: V-TACH ABLATION;  Surgeon: Coralyn Mark, MD;  Location: [facility];  Service: Cardiovascular;  Laterality: N/A;  . VASECTOMY    . VENTRICULAR ABLATION SURGERY  06/01/2013    Social History:  Social History   Socioeconomic History  . Marital status: Married    Spouse  name: Not on file  . Number of children: Not on file  . Years of education: Not on file  . Highest education level: Not on file  Occupational History  . Occupation: Occupational Medicine Physician  Tobacco Use  . Smoking status: Never Smoker  . Smokeless tobacco: Never Used  Substance and Sexual Activity  . Alcohol use: Yes    Alcohol/week: 2.0 standard drinks    Types: 1 Glasses of wine, 1 Cans of beer per week    Comment: social  . Drug use: No  . Sexual activity: Yes  Other Topics Concern  . 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    Moved here from [facility] [place] 1.5 years ago   Social Determinants of Health   Financial  Resource Strain:   . Difficulty of Paying Living Expenses: Not on file  Food Insecurity:   . Worried About Charity fundraiser in the Last Year: Not on file  . Ran Out of Food in the Last Year: Not on file  Transportation Needs:   . Lack of Transportation (Medical): Not on file  . Lack of Transportation (Non-Medical): Not on file  Physical Activity:   . Days of Exercise per Week: Not on file  . Minutes of Exercise per Session: Not on file  Stress:   . Feeling of Stress : Not on file  Social Connections:   . Frequency of Communication with Friends and Family: Not on file  . Frequency of Social Gatherings with Friends and Family: Not on file  . Attends Religious Services: Not on file  . Active Member of Clubs or Organizations: Not on file  . Attends Archivist Meetings: Not on file  . Marital Status: Not on file  Intimate Partner Violence:   . Fear of Current or Ex-Partner: Not on file  . Emotionally Abused: Not on file  . Physically Abused: Not on file  . Sexually Abused: Not on file    Family History:  Family History  Problem Relation Age of Onset  . Alcohol abuse Mother   . COPD Mother   . Depression Mother   . Heart disease Mother   . Hypertension Mother   . Stroke Mother   . COPD Father   . Heart disease Father   . Hypertension Father   . Stroke Father   . Cancer Father   . Diabetes Sister   . Mental retardation Sister   . Learning disabilities Sister   . Hypertension Sister   . Alcohol abuse Brother   . Drug abuse Brother   . Heart disease Brother   . Hypertension Brother   . Coronary artery disease Brother     Medications:   Current Outpatient Medications on File Prior to Visit  Medication Sig Dispense Refill  . acetaminophen (TYLENOL) 500 MG tablet Take 1,000 mg by mouth every 6 (six) hours as needed for moderate pain or headache.    . Ascorbic Acid (VITAMIN C) 1000 MG tablet Take 1,000 mg by mouth 2 (two) times daily.    Marland Kitchen aspirin EC 81 MG  tablet Take 1 tablet (81 mg total) by mouth daily for 21 days. 21 tablet 0  . Blood Glucose Monitoring Suppl (ONETOUCH VERIO) w/Device KIT Use to check blood sugar 3 times a day. 1 kit 0  . carvedilol (COREG) 6.25 MG tablet TAKE 1&1/2 TABLETS BY MOUTH TWO TIMES  DAILY 270 tablet 3  . Cholecalciferol (VITAMIN D) 50 MCG (2000 UT) tablet Take 2,000 Units by mouth daily.    Marland Kitchen docusate sodium (COLACE) 100 MG capsule Take 1 capsule (100 mg total) by mouth 2 (two) times daily. 30 capsule 0  . Dulaglutide (TRULICITY) 1.5 BR/8.3EN SOPN INJECT 1.5MG (1 PEN ) INTO THE SKIN ONCE A WEEK (REPLACE VICTOZA) 2 mL 3  . empagliflozin (JARDIANCE) 10 MG TABS tablet Take 10 mg by mouth daily. 30 tablet 11  . glucose blood (ONETOUCH VERIO) test strip Use to check blood sugar 3 times a day. 200 each 12  . Insulin Glargine (LANTUS SOLOSTAR) 100 UNIT/ML Solostar Pen Inject 38 Units into the skin at bedtime. (Patient taking differently: Inject 34 Units into the skin at bedtime. ) 45 mL 4  . Insulin Pen Needle (UNIFINE PENTIPS) 32G X 4 MM MISC USE TWICE DAILY WITH LANTUS AND VICTOZA 100 each 11  . Lancets MISC Glucose monitor to check blood sugars tid dx code E11.9 brand per insurance coverage 300 each 1  . losartan (COZAAR) 25 MG tablet Take 1 tablet (25 mg total) by mouth daily. 90 tablet 3  . metFORMIN (GLUCOPHAGE-XR) 500 MG 24 hr tablet TAKE 4 TABLETS BY MOUTH DAILY WITH BREAKFAST 360 tablet 1  . Propylene Glycol (SYSTANE BALANCE) 0.6 % SOLN Apply 1 drop to eye daily as needed (dry eyes).    . Quercetin 250 MG TABS Take 500 mg by mouth daily.    . rivaroxaban (XARELTO) 20 MG TABS tablet Take 1 tablet (20 mg total) by mouth daily with supper. 90 tablet 3  . rosuvastatin (CRESTOR) 10 MG tablet TAKE 1 TABLET (10 MG TOTAL) BY MOUTH DAILY. 30 tablet 6  . spironolactone (ALDACTONE) 25 MG tablet Take 1 tablet (25 mg total) by mouth daily. 90 tablet 3  . tamsulosin (FLOMAX) 0.4 MG CAPS capsule Take 0.4 mg by mouth at bedtime.       Marland Kitchen tiZANidine (ZANAFLEX) 2 MG tablet Take 1 tablet (2 mg total) by mouth every 8 (eight) hours as needed for muscle spasms. 40 tablet 0  . zinc gluconate 50 MG tablet Take 50 mg by mouth 2 (two) times daily.     No current facility-administered medications on file prior to visit.    Allergies:   Allergies  Allergen Reactions  . Lisinopril Swelling    angioedema     Physical Exam  Vitals:   07/20/19 1114  BP: (!) 141/78  Pulse: 97  Temp: (!) 97.5 F (36.4 C)  TempSrc: Oral  Weight: 209 lb (94.8 kg)  Height: _0  (1.803 m)   Body mass index is 29.15 kg/m. No exam data present  Depression screen [facility] 2/9 07/20/2019  Decreased Interest 0  Down, Depressed, Hopeless 0  PHQ - 2 Score 0     General: well developed, well nourished,  pleasant elderly Caucasian male, seated, in no evident distress Head: head normocephalic and atraumatic.   Neck: supple with no carotid or supraclavicular bruits Cardiovascular: irregular rate and rhythm, no murmurs Musculoskeletal: no deformity Skin:  no rash/petichiae Vascular:  Normal pulses all extremities   Neurologic Exam Mental Status: Awake and fully alert.   Normal speech and language.  Oriented to place and time. Recent and remote memory intact. Attention span, concentration and fund of knowledge appropriate.  Mood and affect appropriate.  Cranial Nerves: Fundoscopic exam reveals sharp disc margins. Pupils equal, briskly reactive to light. Extraocular movements full without nystagmus. Visual fields full  to confrontation. Hearing intact. Facial sensation intact. Face, tongue, palate moves normally and symmetrically.  Motor: Normal bulk and tone. Normal strength in all tested extremity muscles except mild left hip flexor weakness. Sensory.: intact to touch , pinprick , position and vibratory sensation.  Coordination: Rapid alternating movements normal in all extremities. Finger-to-nose and heel-to-shin performed accurately  bilaterally. Gait and Station: Arises from chair without difficulty. Stance is normal. Gait demonstrates normal stride length with slight favoring of left leg but no  imbalance difficulties Reflexes: 1+ and symmetric. Toes downgoing.     NIHSS  0 Modified Rankin  0    Diagnostic Data (Labs, Imaging, Testing)  CT HEAD CODE STROKE WO CONTRAST 07/02/2019 IMPRESSION:  No evidence of acute intracranial abnormality. Mild generalized parenchymal atrophy and chronic small vessel ischemic disease. Paranasal sinus disease as described.  CT Code Stroke CTA Head W/WO contrast CT Code Stroke CTA Neck W/WO contrast 07/02/2019 IMPRESSION:  1. 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 Acuity is indeterminate.  2. Moderate narrowing of the left V4 segment.  3. Atherosclerosis without significant stenosis in the neck or anterior circulation.   MR BRAIN WO CONTRAST 07/02/2019 IMPRESSION:  Negative for acute infarct. Mild chronic microvascular ischemic change in the white matter.  CT CEREBRAL PERFUSION W CONTRAST 07/02/2019 IMPRESSION: Negative CT perfusion.  No evidence of infarct or acute ischemia.   ECHOCARDIOGRAM COMPLETE 07/03/2019 IMPRESSIONS   1. Left ventricular ejection fraction, by visual estimation, is 55 to 60%. The left ventricle has normal function. There is mildly increased left ventricular hypertrophy.   2. Left ventricular diastolic parameters are indeterminate.   3. Global right ventricle has normal systolic function.The right ventricular size is normal.   4. The mitral valve is normal in structure. No evidence of mitral valve regurgitation.   5. The aortic valve is tricuspid. Aortic valve regurgitation is mild. No evidence of aortic valve stenosis.   6. The tricuspid valve is normal in structure.   7. The pulmonic valve was not well visualized. Pulmonic valve regurgitation is not visualized.   8. Left atrial size was  normal.   9. Right atrial size was normal.  10. The inferior vena cava is normal in size with greater than 50% respiratory variability, suggesting right atrial pressure of 3 mmHg.  11. The tricuspid regurgitant velocity is 2.09 m/s, and with an assumed right atrial pressure of 3 mmHg, the estimated right ventricular systolic pressure is normal at 20.5 mmHg.  12. There is mild dilatation of the aortic root measuring 40 mm.There is dilatation of the ascending aorta measuring 39 mm    ECG - SR rate 85 BPM. (See cardiology reading for complete details)    ASSESSMENT: Paul [name] is a 74 y.o. year old male presented with stuttering deficits including left-sided weakness, speech difficulties confusion and a fall on 07/02/2019 likely due to possible right brain TIA.  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  Due to a strong suspicion for possible atrial fibrillation, recommended 30-day cardiac event monitor outpatient to rule out atrial fibrillation.  Vascular risk factors include cardiomyopathy, VT ablation, CHF, CAD, DVT history, HTN, HLD, DM, right P1 occlusion and likely prior stroke along with recent diagnosis of atrial fibrillation initiating Xarelto with evidence of cardiac monitor.  Reports great recovery since hospital discharge without further left hemianopsia, cognitive  impairment or imbalance.  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  He requests return to work at this time    PLAN:  1. TIA: Continue clopidogrel 75 mg daily  and Crestor for secondary stroke prevention. Maintain strict control of hypertension with blood pressure goal below 130/90, diabetes with hemoglobin A1c goal below 6.5% and cholesterol with LDL cholesterol (bad cholesterol) goal below 70 mg/dL.  I also  advised the patient to eat a healthy diet with plenty of whole grains, cereals, fruits and vegetables, exercise regularly with at least 30 minutes of continuous activity daily and maintain ideal body weight. 2. HTN: Advised to continue current treatment regimen.  Today's BP stable.  Advised to continue to monitor at home along with continued follow-up with PCP for management 3. HLD: Advised to continue current treatment regimen along with continued follow-up with PCP for future prescribing and monitoring of lipid panel 4. DMII: Advised to continue to monitor glucose levels at home along with continued follow-up with PCP for management and monitoring 5. New diagnosis A. Fib: Continuation of Xarelto and aspirin 81 mg daily.  Continue aspirin 81 mg daily for additional 4 days and then discontinue and continue on Xarelto alone per cardiology recommendations.  Follow-up with Dr. Caryl Comes in 2 weeks as scheduled for ongoing monitoring and management 6. Patient was released to return to work at this time especially since he will have supervision and assistance initially upon returning.  He was advised to schedule initial evaluation for speech/cognition due to concerns while inpatient. Patient denies residual impairment and unable to appreciate during visit.  Deferred MMSE/MOCA due to education and intelligence level as these type of testing would likely not be accurate if there are any ongoing concerns.  He was advised that if his cognitive evaluation has any concerns, decision on returning to work will be readdressed.  He verbalized understanding.  He does request assistance with FMLA paperwork from hospitalization until today and agreed to assist him with this paperwork.   Follow up in 4 months or call earlier if needed   Greater than 50% of time during this 45 minute visit was spent on counseling, explanation of diagnosis of TIA, reviewing risk factor management of HTN, HLD and DM along with new diagnosis of A.  fib, discussion regarding prior concerns of cognitive impairment and safety of returning to work along with need cognitive evaluation, planning of further management along with potential future management, and discussion with patient answering all questions to satisfaction    Frann Rider, AGNP-BC  [facility] [address] [employer] [place], [place] [address]  Phone [phone] Fax [phone] Note: This document was prepared with digital dictation and possible smart phrase technology. Any transcriptional errors that result from this process are unintentional.

## 2019-07-20 NOTE — Patient Instructions (Signed)
Cognitive testing scores are within normal limits; given your level of education/intelligence these may not reflect changes in higher level cognition. You may wish to consider neuropsychological testing for a more accurate picture of cognitive-linguistic status.   When returning to work, take your time, double check yourself; during your test, you were more likely to make errors when moving quickly or starting the task before instructions were completed.

## 2019-07-20 NOTE — Telephone Encounter (Signed)
Ok to give whichever long acting insulin we have

## 2019-07-20 NOTE — Patient Instructions (Addendum)
Continue aspirin 81 mg daily and Xarelto (rivaroxaban) daily  and Crestor  for secondary stroke prevention  Continue aspirin for a total of 5 days after starting Xarelto and then Xarelto alone  Continue to follow up with PCP regarding cholesterol, diabetes and blood pressure management   Follow up with Dr. Caryl Comes through cardiology for follow up regarding evidence of atrial fibrillation  From todays visit, you are cleared to return to work - please ensure you schedule a speech evaluation to ensure no further cognitive concerns.   Continue to monitor blood pressure at home  Maintain strict control of hypertension with blood pressure goal below 130/90, diabetes with hemoglobin A1c goal below 6.5% and cholesterol with LDL cholesterol (bad cholesterol) goal below 70 mg/dL. I also advised the patient to eat a healthy diet with plenty of whole grains, cereals, fruits and vegetables, exercise regularly and maintain ideal body weight.  Followup in the future with me in 4 months or call earlier if needed       Thank you for coming to see [place] at [facility]. I hope we have been able to provide you high quality care today.  You may receive a patient satisfaction survey over the next few weeks. We would appreciate your feedback and comments so that we may continue to improve ourselves and the health of our patients.

## 2019-07-20 NOTE — Telephone Encounter (Signed)
Pt came into office and was given one pen of Tresiba U100 insulin and per Dr. Arman Filter instructions, pt was informed to take the same dose of Tyler Aas as he had been taking with Lantus. Pt verbalized understanding. Sample that was given was logged in Medication sample log book.

## 2019-07-20 NOTE — Addendum Note (Signed)
Addended by: Aliene Altes on: 07/20/2019 06:01 PM   Modules accepted: Orders

## 2019-07-20 NOTE — Telephone Encounter (Signed)
Please advise if ok to give and if we do not have Lantus what would you like?

## 2019-07-20 NOTE — Therapy (Signed)
[facility] [address] [place], [place], [address] Phone: [phone]   Fax:  [phone]  Speech Language Pathology Evaluation  Patient Details  Name: Paul [name] MRN: [id number] Date of Birth: [birth year] Referring Provider (SLP): Frann Rider, NP   Encounter Date: 07/20/2019  End of Session - 07/20/19 1558    Visit Number  1    Number of Visits  1    Date for SLP Re-Evaluation  07/20/19    Authorization Type  [employer] Medicare secondary    SLP Start Time  1450    SLP Stop Time   1530    SLP Time Calculation (min)  40 min    Activity Tolerance  Patient tolerated treatment well       Past Medical History:  Diagnosis Date  . Aftercare following removal/replacement defibrillator    a. defibrillator removed 03/2019.  Marland Kitchen Arthritis    "thumbs, neck, knees" (06/01/2013)  . Cardiomyopathy, dilated, nonischemic ([place]) 10/20/2012  . Cataract   . Chronic systolic heart failure ([place])   . CKD (chronic kidney disease), stage III   . Coronary artery disease    stent placed  . DVT (deep vein thrombosis) in pregnancy   . 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  . Erectile dysfunction   . GERD (gastroesophageal reflux disease)   . Hearing aid worn    B/L  . Hypertension   . Hypogonadism male   . Hypovitaminosis D   . 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 NICM (nonischemic cardiomyopathy) ([place])   . Sleep apnea    cpap  . Type II diabetes mellitus ([place])   . Ventricular tachycardia ([place])    observed during stress testing, subsequent VT on EPS  . Wears glasses     Past Surgical History:  Procedure Laterality Date  . ABLATION  06-01-2013   PVC's ablated along basal inferoseptal RV by Dr Rayann Heman  . APPENDECTOMY  1969  . CARDIAC CATHETERIZATION  04/2010  . CARDIAC DEFIBRILLATOR PLACEMENT  05/09/2010   MDT ICD implanted in [place] by Dr Ky Barban  .  CATARACT EXTRACTION W/ INTRAOCULAR LENS  IMPLANT, BILATERAL Bilateral   . ICD LEAD REMOVAL N/A 03/19/2019   Procedure: ICD LEAD AND GENERATOR REMOVAL;  Surgeon: Evans Lance, MD;  Location: [facility];  Service: Cardiovascular;  Laterality: N/A;  . KNEE ARTHROSCOPY Left 1984  . LUMBAR LAMINECTOMY  ~ 2004  . TONSILLECTOMY AND ADENOIDECTOMY  1972  . TOTAL KNEE ARTHROPLASTY Left 04/28/2017  . TOTAL KNEE ARTHROPLASTY Left 04/28/2017   Procedure: TOTAL KNEE ARTHROPLASTY LEFT;  Surgeon: Dorna Leitz, MD;  Location: [facility];  Service: Orthopedics;  Laterality: Left;  . TOTAL KNEE ARTHROPLASTY Right 05/07/2019   Procedure: TOTAL KNEE ARTHROPLASTY;  Surgeon: Dorna Leitz, MD;  Location: [facility];  Service: Orthopedics;  Laterality: Right;  . V-TACH ABLATION N/A 06/01/2013   Procedure: V-TACH ABLATION;  Surgeon: Coralyn Mark, MD;  Location: [facility];  Service: Cardiovascular;  Laterality: N/A;  . VASECTOMY    . VENTRICULAR ABLATION SURGERY  06/01/2013    There were no vitals filed for this visit.  Subjective Assessment - 07/20/19 1452    Subjective  "Hopefully I'll be a little more successful than starting back to work after my knee replacement."    Currently in Pain?  No/denies         SLP Evaluation [facility] - 07/20/19 1452  SLP Visit Information   SLP Received On  07/20/19    Referring Provider (SLP)  Frann Rider, NP    Onset Date  07/02/19    Medical Diagnosis  TIA      Subjective   Subjective  "I'm going back to work tomorrow."    Patient/Family Stated Goal  go back to work      General Information   HPI  [name] is a 74 year old male with PMHx noted for cardiomyopathy, VT ablation, CHF, CAD, DVT, HTN, sleep apnea, DM2 who presented to [facility] on 07/02/2019 with left-sided weakness, speech difficulties, confusion and a fall due to TIA, 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 OT/ST on acute felt pt had cognitive deficits and pt referred for PT, OT assessments as OP. Has  already been seen by PT, OT (who advised OP ST eval for cognition). Acute OT documented field cut; pt has since seen neuro opthamology and has been cleared for driving. Has been cleared by neuro for return to work pending ST cognitive assessment.     Behavioral/Cognition  alert, cooperative    Mobility Status  ambulated to session; appeared unsteady when standing to leave      Balance Screen   Has the patient fallen in the past 6 months  --   not since return home from hospital     Prior Functional Status   Cognitive/Linguistic Baseline  Within functional limits    Vocation  Full time employment   MD in occupational health for [employer]     Pain Assessment   Pain Assessment  No/denies pain      Cognition   Overall Cognitive Status  No family/caregiver present to determine baseline cognitive functioning   Patient denies changes; wife not present to confirm   Attention  Alternating    Alternating Attention  --   lost attention when attempting to correct trailmaking error   Alternating Attention Impairment  --   scores WNL   Memory  Appears intact    Awareness  Impaired    Awareness Impairment  Emergent impairment   aware of ~60% of errors; impulsive   Problem Solving  Appears intact    Executive Function  Self Monitoring    Behaviors  Impulsive      Auditory Comprehension   Overall Auditory Comprehension  Appears within functional limits for tasks assessed      Visual Recognition/Discrimination   Discrimination  Within Function Limits      Reading Comprehension   Reading Status  Within funtional limits      Expression   Primary Mode of Expression  Verbal      Verbal Expression   Overall Verbal Expression  Appears within functional limits for tasks assessed      Written Expression   Dominant Hand  Right      Oral Motor/Sensory Function   Overall Oral Motor/Sensory Function  Other (comment)   deferred due to masking     Motor Speech   Overall Motor Speech  Appears  within functional limits for tasks assessed      Standardized Assessments   Standardized Assessments   Cognitive Linguistic Quick Test      Cognitive Linguistic Quick Test (Ages 18-69)   Attention  WNL   189/215 (WNL)    Memory  WNL   184/185 (WNL)   Executive Function  WNL   28/40 (WNL), clock drawing 12/13, WNL   Language  WNL   36/37 (WNL)   Visuospatial  Skills  WNL   86/105 (WNL)   Severity Rating Total  20    Composite Severity Rating  16.8          SLP Education - 07/20/19 1554    Education Details  results of cognitive assessment, consider neuropsychiatry for neuro-cognitive testing    Person(s) Educated  Patient    Methods  Explanation    Comprehension  Verbalized understanding           Plan - 07/20/19 1610    Clinical Impression Statement  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 SLP noted impulsivity during testing, which contributed to errors in trailmaking and mazes tasks. At this time he does not wish to consider cognitive rehabilitation; he plans to return to work tomorrow. He feels he is functioning at prior level. His spouse was not present to confirm. SLP educated re: evaluation findings including impulsivity, error awareness, and that pt may find difficulty in higher level attention/multitasking; he is encouraged to return for ST should this become apparent when resuming work. 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 SLP encouraged pt to follow up with NP Frann Rider should he wish to pursue ST for higher level cognition.    Speech Therapy Frequency  One time visit    Treatment/Interventions  SLP instruction and feedback;Patient/family education    Consulted and Agree with  Plan of Care  Patient       Patient will benefit from skilled therapeutic intervention in order to improve the following deficits and impairments:   Cognitive communication deficit    Problem List Patient Active Problem List   Diagnosis Date Noted  . TIA (transient ischemic attack) 07/02/2019  . Primary osteoarthritis of right knee 05/07/2019  . Aftercare following removal or replacement of defibrillator 03/20/2019  . 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 Chronic systolic CHF (congestive heart failure) ([place]) 05/04/2014  . PVC's (premature ventricular contractions) 06/01/2013  . Essential hypertension 05/17/2013  . Cardiomyopathy, dilated, nonischemic ([place]) 10/20/2012  . Ventricular tachycardia/frequent PVCs 10/20/2012  . Controlled type 2 diabetes mellitus ([place]) 10/20/2012  . Hypogonadism male 10/20/2012   Deneise Lever, [place], [place] [address] Maliah Pyles 07/20/2019, 5:58 PM  [facility] [address] [place], [place], [address] Phone: [phone]   Fax:  [phone]  Name: GLENDALE OPENSHAW MRN: [id number] Date of Birth: 11/29/1945

## 2019-07-20 NOTE — Progress Notes (Signed)
I agree with the above plan 

## 2019-07-21 ENCOUNTER — Other Ambulatory Visit (HOSPITAL_COMMUNITY): Payer: Self-pay

## 2019-07-21 DIAGNOSIS — Z0289 Encounter for other administrative examinations: Secondary | ICD-10-CM

## 2019-07-21 MED ORDER — ASPIRIN EC 81 MG PO TBEC: 81.0000 mg | DELAYED_RELEASE_TABLET | Freq: Every day | ORAL | 0 refills | Status: DC

## 2019-07-21 NOTE — Progress Notes (Signed)
I appreciate you evaluating him so quickly.  I was not able to observe any type of cognitive concerns during appointment yesterday but more so overly anxious to be able to return back to work.  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  Thank you again for the evaluation update.  Please let me know if you have any questions or concerns.

## 2019-07-22 ENCOUNTER — Telehealth: Payer: Self-pay | Admitting: Internal Medicine

## 2019-07-22 ENCOUNTER — Encounter (INDEPENDENT_AMBULATORY_CARE_PROVIDER_SITE_OTHER): Payer: BLUE CROSS/BLUE SHIELD | Admitting: Ophthalmology

## 2019-07-22 MED ORDER — LANTUS SOLOSTAR 100 UNIT/ML ~~LOC~~ SOPN: 34.0000 [IU] | PEN_INJECTOR | Freq: Every day | SUBCUTANEOUS | 2 refills | Status: DC

## 2019-07-22 MED ORDER — JARDIANCE 10 MG PO TABS: 10.0000 mg | ORAL_TABLET | Freq: Every day | ORAL | 2 refills | Status: DC

## 2019-07-22 NOTE — Telephone Encounter (Signed)
MEDICATION: Insulin Glargine (LANTUS SOLOSTAR) 100 UNIT/ML Solostar Pen AND  empagliflozin (JARDIANCE) 10 MG TABS tablet  PHARMACY:   [place], [place] Phone:  [phone]  Fax:  [phone]      IS THIS A 90 DAY SUPPLY : Yes  IS PATIENT OUT OF MEDICATION: Yes   IF NOT; HOW MUCH IS LEFT:  (has 1 sample)  LAST APPOINTMENT DATE: @2 /06/2019  NEXT APPOINTMENT DATE:@03 /25/2021  DO WE HAVE YOUR PERMISSION TO LEAVE A DETAILED MESSAGE: Yes  OTHER COMMENTS:    **Let patient know to contact pharmacy at the end of the day to make sure medication is ready. **  ** Please notify patient to allow 48-72 hours to process**  **Encourage patient to contact the pharmacy for refills or they can request refills through [facility]**

## 2019-07-22 NOTE — Telephone Encounter (Signed)
Both have been sent. 

## 2019-07-27 ENCOUNTER — Other Ambulatory Visit (HOSPITAL_COMMUNITY): Payer: Self-pay

## 2019-07-27 NOTE — Telephone Encounter (Signed)
Received a refill request [facility] aspirin [facility] however per last note pt was only to take for 3 weeks. Not authorized

## 2019-07-28 ENCOUNTER — Encounter: Payer: Self-pay | Admitting: *Deleted

## 2019-07-28 NOTE — Progress Notes (Signed)
Completed and placed in out box

## 2019-07-29 ENCOUNTER — Emergency Department (HOSPITAL_COMMUNITY)
Admission: EM | Admit: 2019-07-29 | Discharge: 2019-07-29 | Disposition: A | Discharge status: Home / Self Care | Payer: BC Managed Care – PPO | Attending: Emergency Medicine | Admitting: Emergency Medicine

## 2019-07-29 ENCOUNTER — Encounter (HOSPITAL_COMMUNITY): Payer: Self-pay

## 2019-07-29 ENCOUNTER — Other Ambulatory Visit: Payer: Self-pay

## 2019-07-29 ENCOUNTER — Emergency Department (HOSPITAL_COMMUNITY): Payer: BC Managed Care – PPO

## 2019-07-29 ENCOUNTER — Telehealth: Payer: Self-pay | Admitting: *Deleted

## 2019-07-29 DIAGNOSIS — I130 Hypertensive heart and chronic kidney disease with heart failure and stage 1 through stage 4 chronic kidney disease, or unspecified chronic kidney disease: Secondary | ICD-10-CM | POA: Insufficient documentation

## 2019-07-29 DIAGNOSIS — Z79899 Other long term (current) drug therapy: Secondary | ICD-10-CM | POA: Diagnosis not present

## 2019-07-29 DIAGNOSIS — Z794 Long term (current) use of insulin: Secondary | ICD-10-CM | POA: Insufficient documentation

## 2019-07-29 DIAGNOSIS — I5022 Chronic systolic (congestive) heart failure: Secondary | ICD-10-CM | POA: Diagnosis not present

## 2019-07-29 DIAGNOSIS — R410 Disorientation, unspecified: Secondary | ICD-10-CM | POA: Diagnosis not present

## 2019-07-29 DIAGNOSIS — I10 Essential (primary) hypertension: Secondary | ICD-10-CM | POA: Diagnosis not present

## 2019-07-29 DIAGNOSIS — N183 Chronic kidney disease, stage 3 unspecified: Secondary | ICD-10-CM | POA: Insufficient documentation

## 2019-07-29 DIAGNOSIS — E119 Type 2 diabetes mellitus without complications: Secondary | ICD-10-CM | POA: Insufficient documentation

## 2019-07-29 DIAGNOSIS — I2510 Atherosclerotic heart disease of native coronary artery without angina pectoris: Secondary | ICD-10-CM | POA: Diagnosis not present

## 2019-07-29 LAB — DIFFERENTIAL
Abs Immature Granulocytes: 0.03 10*3/uL (ref 0.00–0.07)
Basophils Absolute: 0.1 10*3/uL (ref 0.0–0.1)
Basophils Relative: 1 %
Eosinophils Absolute: 0.6 10*3/uL — ABNORMAL HIGH (ref 0.0–0.5)
Eosinophils Relative: 7 %
Immature Granulocytes: 0 %
Lymphocytes Relative: 25 %
Lymphs Abs: 2.4 10*3/uL (ref 0.7–4.0)
Monocytes Absolute: 0.6 10*3/uL (ref 0.1–1.0)
Monocytes Relative: 7 %
Neutro Abs: 5.8 10*3/uL (ref 1.7–7.7)
Neutrophils Relative %: 60 %

## 2019-07-29 LAB — CBC
HCT: 43.2 % (ref 39.0–52.0)
Hemoglobin: 13.7 g/dL (ref 13.0–17.0)
MCH: 30.2 pg (ref 26.0–34.0)
MCHC: 31.7 g/dL (ref 30.0–36.0)
MCV: 95.4 fL (ref 80.0–100.0)
Platelets: 219 10*3/uL (ref 150–400)
RBC: 4.53 MIL/uL (ref 4.22–5.81)
RDW: 13.6 % (ref 11.5–15.5)
WBC: 9.6 10*3/uL (ref 4.0–10.5)
nRBC: 0 % (ref 0.0–0.2)

## 2019-07-29 LAB — COMPREHENSIVE METABOLIC PANEL
ALT: 20 U/L (ref 0–44)
AST: 21 U/L (ref 15–41)
Albumin: 3.9 g/dL (ref 3.5–5.0)
Alkaline Phosphatase: 71 U/L (ref 38–126)
Anion gap: 11 (ref 5–15)
BUN: 27 mg/dL — ABNORMAL HIGH (ref 8–23)
CO2: 25 mmol/L (ref 22–32)
Calcium: 8.4 mg/dL — ABNORMAL LOW (ref 8.9–10.3)
Chloride: 99 mmol/L (ref 98–111)
Creatinine, Ser: 1.43 mg/dL — ABNORMAL HIGH (ref 0.61–1.24)
GFR calc Af Amer: 57 mL/min — ABNORMAL LOW (ref >60)
GFR calc non Af Amer: 49 mL/min — ABNORMAL LOW (ref >60)
Glucose, Bld: 130 mg/dL — ABNORMAL HIGH (ref 70–99)
Potassium: 4.2 mmol/L (ref 3.5–5.1)
Sodium: 135 mmol/L (ref 135–145)
Total Bilirubin: 0.5 mg/dL (ref 0.3–1.2)
Total Protein: 6.5 g/dL (ref 6.5–8.1)

## 2019-07-29 LAB — I-STAT CHEM 8, ED
BUN: 28 mg/dL — ABNORMAL HIGH (ref 8–23)
Calcium, Ion: 1.09 mmol/L — ABNORMAL LOW (ref 1.15–1.40)
Chloride: 98 mmol/L (ref 98–111)
Creatinine, Ser: 1.5 mg/dL — ABNORMAL HIGH (ref 0.61–1.24)
Glucose, Bld: 120 mg/dL — ABNORMAL HIGH (ref 70–99)
HCT: 43 % (ref 39.0–52.0)
Hemoglobin: 14.6 g/dL (ref 13.0–17.0)
Potassium: 4.1 mmol/L (ref 3.5–5.1)
Sodium: 135 mmol/L (ref 135–145)
TCO2: 27 mmol/L (ref 22–32)

## 2019-07-29 LAB — PROTIME-INR
INR: 2.4 — ABNORMAL HIGH (ref 0.8–1.2)
Prothrombin Time: 26.3 seconds — ABNORMAL HIGH (ref 11.4–15.2)

## 2019-07-29 LAB — APTT: aPTT: 40 seconds — ABNORMAL HIGH (ref 24–36)

## 2019-07-29 LAB — CBG MONITORING, ED: Glucose-Capillary: 132 mg/dL — ABNORMAL HIGH (ref 70–99)

## 2019-07-29 NOTE — ED Notes (Signed)
Pt transported to CT ?

## 2019-07-29 NOTE — Discharge Instructions (Addendum)
Keep taking you prescribed medications.  You will need to follow up with Neurology outpatient within the next 1-2 weeks.  Return to the ED for any new or worsening symptoms.

## 2019-07-29 NOTE — ED Triage Notes (Signed)
Pt arrives POV for eval of an episode of confusion while at work today. Pt reports that he was admitted for a TIA 3 weeks ago, and has been back to work since. At that time he was started on xarelto for poss afib and had a holter monitor placed. 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 His coworker performed a mini neuro exam on him and noted him to be neuro intact aside from confusion. D/t recent TIA, pt here for further eval. Neuro intact in triage, A&OX4, confusion resolved, pt reports only last about 30 minutes

## 2019-07-29 NOTE — ED Provider Notes (Signed)
[facility] EMERGENCY DEPARTMENT Provider Note   CSN: [id number] Arrival date & time: 07/29/19  1414     History Chief Complaint  Patient presents with  . Possible TIA    Paul [name] is a 74 y.o. male with past medical history significant for dilated cardiomyopathy, CKD, CAD, HTN, DM, VT presents for evaluation of acute effusion while at work.  Patient states he was admitted recently discharged 3 weeks ago for TIA.  States at discharge he was noted to have a couple second run of A. fib and was subsequently started on Xarelto.  He has Holter monitor placed currently.  He states he was talking with a colleague today when he noted confusion, did not know what papers or what process he was currently performing.  His colleague at the time performed a mini neurologic exam which noted no deficits, per patient except for his confusion.  Patient states symptoms lasted approximately 30 minutes. Patient states all symptoms resolved PTA.  He denied any sudden onset thunderclap headache, neck pain, neck stiffness, vision changes, facial droop, chest pain, shortness of breath, lateral weakness, paresthesias, abdominal pain, diarrhea, dysuria.  Patient states he is compliant with medications he was discharged on.  He has followed up with neurology as well as Dr. Caryl Comes with cardiology for his possible atrial fibrillation after dc frmo hospital.  Denies additional aggravating or alleviating factors.  History obtained from patient and past medical records.  No interpreter is used.  HPI     Past Medical History:  Diagnosis Date  . Aftercare following removal/replacement defibrillator    a. defibrillator removed 03/2019.  Marland Kitchen Arthritis    "thumbs, neck, knees" (06/01/2013)  . Cardiomyopathy, dilated, nonischemic ([place]) 10/20/2012  . Cataract   . Chronic systolic heart failure ([place])   . CKD (chronic kidney disease), stage III   . Coronary artery disease    stent placed  . DVT (deep  vein thrombosis) in pregnancy   . 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  . Erectile dysfunction   . GERD (gastroesophageal reflux disease)   . Hearing aid worn    B/L  . Hypertension   . Hypogonadism male   . Hypovitaminosis D   . 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 NICM (nonischemic cardiomyopathy) ([place])   . Sleep apnea    cpap  . Type II diabetes mellitus ([place])   . Ventricular tachycardia ([place])    observed during stress testing, subsequent VT on EPS  . Wears glasses     Patient Active Problem List   Diagnosis Date Noted  . TIA (transient ischemic attack) 07/02/2019  . Primary osteoarthritis of right knee 05/07/2019  . Aftercare following removal or replacement of defibrillator 03/20/2019  . 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 Chronic systolic CHF (congestive heart failure) ([place]) 05/04/2014  . PVC's (premature ventricular contractions) 06/01/2013  . Essential hypertension 05/17/2013  . Cardiomyopathy, dilated, nonischemic ([place]) 10/20/2012  . Ventricular tachycardia/frequent PVCs 10/20/2012  . Controlled type 2 diabetes mellitus ([place]) 10/20/2012  . Hypogonadism male 10/20/2012    Past Surgical History:  Procedure Laterality Date  . ABLATION  06-01-2013   PVC's ablated along basal inferoseptal RV by Dr Rayann Heman  . APPENDECTOMY  1969  . CARDIAC CATHETERIZATION  04/2010  . CARDIAC DEFIBRILLATOR PLACEMENT  05/09/2010   MDT ICD implanted in [place] by Dr Ky Barban  . CATARACT EXTRACTION W/ INTRAOCULAR LENS  IMPLANT,  BILATERAL Bilateral   . ICD LEAD REMOVAL N/A 03/19/2019   Procedure: ICD LEAD AND GENERATOR REMOVAL;  Surgeon: Evans Lance, MD;  Location: [facility];  Service: Cardiovascular;  Laterality: N/A;  . KNEE ARTHROSCOPY Left 1984  . LUMBAR LAMINECTOMY  ~ 2004  . TONSILLECTOMY AND ADENOIDECTOMY  1972  . TOTAL KNEE ARTHROPLASTY Left 04/28/2017  . TOTAL KNEE  ARTHROPLASTY Left 04/28/2017   Procedure: TOTAL KNEE ARTHROPLASTY LEFT;  Surgeon: Dorna Leitz, MD;  Location: [facility];  Service: Orthopedics;  Laterality: Left;  . TOTAL KNEE ARTHROPLASTY Right 05/07/2019   Procedure: TOTAL KNEE ARTHROPLASTY;  Surgeon: Dorna Leitz, MD;  Location: [facility];  Service: Orthopedics;  Laterality: Right;  . V-TACH ABLATION N/A 06/01/2013   Procedure: V-TACH ABLATION;  Surgeon: Coralyn Mark, MD;  Location: [facility] CATH [facility];  Service: Cardiovascular;  Laterality: N/A;  . VASECTOMY    . VENTRICULAR ABLATION SURGERY  06/01/2013       Family History  Problem Relation Age of Onset  . Alcohol abuse Mother   . COPD Mother   . Depression Mother   . Heart disease Mother   . Hypertension Mother   . Stroke Mother   . COPD Father   . Heart disease Father   . Hypertension Father   . Stroke Father   . Cancer Father   . Diabetes Sister   . Mental retardation Sister   . Learning disabilities Sister   . Hypertension Sister   . Alcohol abuse Brother   . Drug abuse Brother   . Heart disease Brother   . Hypertension Brother   . Coronary artery disease Brother     Social History   Tobacco Use  . Smoking status: Never Smoker  . Smokeless tobacco: Never Used  Substance Use Topics  . Alcohol use: Yes    Alcohol/week: 2.0 standard drinks    Types: 1 Glasses of wine, 1 Cans of beer per week    Comment: social  . Drug use: No    Home Medications Prior to Admission medications   Medication Sig Start Date End Date Taking? Authorizing Provider  acetaminophen (TYLENOL) 500 MG tablet Take 1,000 mg by mouth every 6 (six) hours as needed for mild pain, moderate pain or headache.    Yes [provider]  Acetylcysteine 600 MG CAPS Take 600 mg by mouth 2 (two) times daily.   Yes [provider]  Ascorbic Acid (VITAMIN C) 1000 MG tablet Take 1,000 mg by mouth 2 (two) times daily.   Yes [provider]  carvedilol (COREG) 6.25 MG tablet TAKE 1&1/2  TABLETS BY MOUTH TWO TIMES DAILY Patient taking differently: Take 6.25 mg by mouth 2 (two) times daily with a meal.  07/14/19  Yes Larey Dresser, MD  Cholecalciferol (VITAMIN D3) 50 MCG (2000 UT) TABS Take 4,000 Units by mouth 2 (two) times daily.   Yes [provider]  docusate sodium (COLACE) 100 MG capsule Take 1 capsule (100 mg total) by mouth 2 (two) times daily. 05/07/19  Yes Gary Fleet, PA-C  Dulaglutide (TRULICITY) 1.5 OV/5.6EP SOPN INJECT 1.5MG (1 PEN ) INTO THE SKIN ONCE A WEEK (REPLACE VICTOZA) Patient taking differently: Inject 1.5 mg into the skin every Saturday. ("REPLACES VICTOZA") 07/16/19  Yes Philemon Kingdom, MD  empagliflozin (JARDIANCE) 10 MG TABS tablet Take 10 mg by mouth daily. 07/22/19  Yes Philemon Kingdom, MD  Insulin Glargine (LANTUS SOLOSTAR) 100 UNIT/ML Solostar Pen Inject 34 Units into the skin at bedtime. Patient  taking differently: Inject 25 Units into the skin at bedtime.  07/22/19  Yes Philemon Kingdom, MD  losartan (COZAAR) 25 MG tablet Take 1 tablet (25 mg total) by mouth daily. 07/14/19 10/12/19 Yes Larey Dresser, MD  metFORMIN (GLUCOPHAGE-XR) 500 MG 24 hr tablet TAKE 4 TABLETS BY MOUTH DAILY WITH BREAKFAST Patient taking differently: Take 2,000 mg by mouth daily with breakfast.  07/14/19  Yes Philemon Kingdom, MD  Quercetin 250 MG TABS Take 500 mg by mouth daily.   Yes [provider]  rivaroxaban (XARELTO) 20 MG TABS tablet Take 1 tablet (20 mg total) by mouth daily with supper. Patient taking differently: Take 20 mg by mouth daily.  07/19/19  Yes Fay Records, MD  rosuvastatin (CRESTOR) 10 MG tablet TAKE 1 TABLET (10 MG TOTAL) BY MOUTH DAILY. Patient taking differently: Take 10 mg by mouth at bedtime.  04/22/19  Yes Larey Dresser, MD  spironolactone (ALDACTONE) 25 MG tablet Take 1 tablet (25 mg total) by mouth daily. 07/14/19  Yes Larey Dresser, MD  tamsulosin (FLOMAX) 0.4 MG CAPS capsule Take 0.4 mg by mouth at bedtime.    Yes  [provider]  traMADol (ULTRAM) 50 MG tablet Take 50 mg by mouth every 6 (six) hours as needed (for pain).   Yes [provider]  zinc gluconate 50 MG tablet Take 50 mg by mouth 2 (two) times daily.   Yes [provider]  aspirin EC 81 MG tablet Take 1 tablet (81 mg total) by mouth daily for 21 days. Patient not taking: Reported on 07/29/2019 07/21/19 08/11/19  Larey Dresser, MD  Blood Glucose Monitoring Suppl [facility] VERIO) w/Device KIT Use to check blood sugar 3 times a day. 10/07/18   Philemon Kingdom, MD  glucose blood (ONETOUCH VERIO) test strip Use to check blood sugar 3 times a day. 07/16/19   Philemon Kingdom, MD  Insulin Pen Needle (UNIFINE PENTIPS) 32G X 4 MM MISC USE TWICE DAILY WITH LANTUS AND VICTOZA 07/16/19   Philemon Kingdom, MD  Lancets MISC Glucose monitor to check blood sugars tid dx code E11.9 brand per insurance coverage 07/23/17   Philemon Kingdom, MD  tiZANidine (ZANAFLEX) 2 MG tablet Take 1 tablet (2 mg total) by mouth every 8 (eight) hours as needed for muscle spasms. Patient not taking: Reported on 07/29/2019 05/07/19   Gary Fleet, PA-C    Allergies    Lisinopril  Review of Systems   Review of Systems  Constitutional: Negative.   HENT: Negative.   Respiratory: Negative.   Cardiovascular: Negative.   Gastrointestinal: Negative.   Genitourinary: Negative.   Musculoskeletal: Negative.   Skin: Negative.   Neurological: Positive for speech difficulty. Negative for dizziness, tremors, seizures, syncope, facial asymmetry, weakness, light-headedness, numbness and headaches.       Confusion for 30 min PTA, resolved PTA  All other systems reviewed and are negative.   Physical Exam Updated Vital Signs BP 137/72   Pulse 65   Temp 97.7 F (36.5 C) (Oral)   Resp 13   Ht '5\' 11"'  (1.803 m)   Wt 94.8 kg   SpO2 99%   BMI 29.15 kg/m   Physical Exam Physical Exam  Constitutional: Pt appears well-developed and well-nourished. No  distress.  Pleasant male able to hold conversation without difficulty. HENT:  Head: Normocephalic and atraumatic.  Mouth/Throat: Oropharynx is clear and moist.  Eyes: Conjunctivae and EOM are normal. Pupils are equal, round, and reactive to light. No scleral icterus.  No  horizontal, vertical or rotational nystagmus  Neck: Normal range of motion. Neck supple.  Full active and passive ROM without pain No midline or paraspinal tenderness No nuchal rigidity or meningeal signs  Cardiovascular: Normal rate, regular rhythm and intact distal pulses.   Pulmonary/Chest: Effort normal and breath sounds normal. No respiratory distress. Pt has no wheezes. No rales. Heart monitor to central chest wall. Abdominal: Soft. Bowel sounds are normal. There is no tenderness. There is no rebound and no guarding.  Musculoskeletal: Normal range of motion.  Lymphadenopathy:    No cervical adenopathy.  Neurological: Pt. is alert and oriented to person, place, and time. He has normal reflexes. No cranial nerve deficit.  Exhibits normal muscle tone. Coordination normal.  Mental Status:  Alert, oriented, thought content appropriate. Speech fluent without evidence of aphasia. Able to follow 2 step commands without difficulty.  Cranial Nerves:  II:  Peripheral visual fields grossly normal, pupils equal, round III,IV, VI: ptosis not present, extra-ocular motions intact bilaterally  V,VII: smile symmetric VIII: hearing grossly normal bilaterally  IX,X: midline uvula rise  XI: bilateral shoulder shrug equal and strong XII: midline tongue extension  Motor:  5/5 in upper and lower extremities bilaterally including strong and equal grip strength and dorsiflexion/plantar flexion Sensory: Pinprick and light touch normal in all extremities.  Cerebellar: normal finger-to-nose with bilateral upper extremities Gait: normal gait and balance CV: distal pulses palpable throughout   Skin: Skin is warm and dry. No rash noted. Pt is  not diaphoretic.  Psychiatric: Pt has a normal mood and affect. Behavior is normal. Judgment and thought content normal.  Nursing note and vitals reviewed. ED Results / Procedures / Treatments   Labs (all labs ordered are listed, but only abnormal results are displayed) Labs Reviewed  PROTIME-INR - Abnormal; Notable for the following components:      Result Value   Prothrombin Time 26.3 (*)    INR 2.4 (*)    All other components within normal limits  APTT - Abnormal; Notable for the following components:   aPTT 40 (*)    All other components within normal limits  DIFFERENTIAL - Abnormal; Notable for the following components:   Eosinophils Absolute 0.6 (*)    All other components within normal limits  COMPREHENSIVE METABOLIC PANEL - Abnormal; Notable for the following components:   Glucose, Bld 130 (*)    BUN 27 (*)    Creatinine, Ser 1.43 (*)    Calcium 8.4 (*)    GFR calc non Af Amer 49 (*)    GFR calc Af Amer 57 (*)    All other components within normal limits  I-STAT CHEM 8, ED - Abnormal; Notable for the following components:   BUN 28 (*)    Creatinine, Ser 1.50 (*)    Glucose, Bld 120 (*)    Calcium, Ion 1.09 (*)    All other components within normal limits  CBG MONITORING, ED - Abnormal; Notable for the following components:   Glucose-Capillary 132 (*)    All other components within normal limits  CBC    EKG EKG Interpretation  Date/Time:  Thursday July 29 2019 14:20:37 EST Ventricular Rate:  72 PR Interval:  182 QRS Duration: 148 QT Interval:  450 QTC Calculation: 492 R Axis:   -59 Text Interpretation: Undetermined rhythm Left axis deviation Right bundle branch block Inferior infarct , age undetermined Anterior infarct , age undetermined Abnormal ECG No significant change since last tracing Confirmed by Fredia Sorrow [phone]) on 07/29/2019 4:32:08  PM   Radiology CT HEAD WO CONTRAST  Result Date: 07/29/2019 CLINICAL DATA:  74 year old male with  confusion, TIA symptoms today. EXAM: CT HEAD WITHOUT CONTRAST TECHNIQUE: Contiguous axial images were obtained from the base of the skull through the vertex without intravenous contrast. COMPARISON:  Brain MRI 07/02/2019 and earlier. FINDINGS: Brain: Incidental small midline intracranial lipomas, normal variant. Stable cerebral volume. No intracranial mass effect or ventriculomegaly. Mega cisterna magna, normal variant. Bilateral patchy and confluent white matter hypodensity appears stable. No acute intracranial hemorrhage identified. No cortically based acute infarct identified. No cortical encephalomalacia identified. Vascular: Calcified atherosclerosis at the skull base. No suspicious intracranial vascular hyperdensity. Skull: No acute osseous abnormality identified. Sinuses/Orbits: Chronic right maxillary sinus disease. Other Visualized paranasal sinuses and mastoids are clear. Other: No acute orbit or scalp soft tissue findings. IMPRESSION: 1. Stable non contrast CT appearance of the brain with chronic white matter disease. 2. Chronic right maxillary sinus disease. Electronically Signed   By: Genevie Ann M.D.   On: 07/29/2019 17:34    Procedures Procedures (including critical care time)  Medications Ordered in ED Medications - No data to display  ED Course  I have reviewed the triage vital signs and the nursing notes.  Pertinent labs & imaging results that were available during my care of the patient were reviewed by me and considered in my medical decision making (see chart for details).  Pleasant 75 year old male presents for evaluation of TIA symptoms.  He was recently admitted 3 weeks ago for similar symptoms.  He is being followed by cardiology for possible atrial fibrillation, compliant on Xarelto, Holter monitor currently in place and he has also followed with neurology.  He is compliant with his medications at home.  30 minutes of confusion while at work.  Arrival patient symptoms have  resolved.  He has a nonfocal neuro exam without deficits.  Heart and lungs clear.  Labs obtained from triage.  CBC without leukocytosis, hemoglobin 41.2 Metabolic panel with mild hyperglycemia, mildly elevated BUN and creatinine however consistent with prior labs. EKG with RBBB consistent with previous, No STEMI  Once CT head is resulted we will plan to consult with neurology to make sure patient is optimized on his medication management and for recommendations.  CT head negative for acute abnormalities. No sudden thunderclap HA, neck stiffness or neck rigidity to suggest SAH, bleed.  Low suspicion for cardiac, pulmonary or bacterial cause of confusion.   1740: Consulted with Dr. Leonel Ramsay with Neurology.  Feels patient does not need inpatient management he is continue on his outpatient therapy and follow-up with neurology. Does not need MR today. He can return for any new or worsening symptoms.  The patient has been appropriately medically screened and/or stabilized in the ED. I have low suspicion for any other emergent medical condition which would require further screening, evaluation or treatment in the ED or require inpatient management.  Patient is hemodynamically stable and in no acute distress.  Patient able to ambulate in department prior to ED.  Evaluation does not show acute pathology that would require ongoing or additional emergent interventions while in the emergency department or further inpatient treatment.  I have discussed the diagnosis with the patient and answered all questions.  Pain is been managed while in the emergency department and patient has no further complaints prior to discharge.  Patient is comfortable with plan discussed in room and is stable for discharge at this time.  I have discussed strict return precautions for returning to  the emergency department.  Patient was encouraged to follow-up with PCP/specialist refer to at discharge.  Patient seen and evaluated by  attending Dr. Rogene Houston who agrees with above treatment, plan and disposition.    MDM Rules/Calculators/A&P                       Final Clinical Impression(s) / ED Diagnoses Final diagnoses:  Acute confusion    Rx / DC Orders ED Discharge Orders    None       Devaeh Amadi A, PA-C 07/29/19 1755    Fredia Sorrow, MD 07/29/19 1827

## 2019-07-29 NOTE — Telephone Encounter (Signed)
I e-mail 2 forms to pt on 07/29/19

## 2019-07-29 NOTE — ED Provider Notes (Signed)
Medical screening examination/treatment/procedure(s) were conducted as a shared visit with non-physician practitioner(s) and myself.  I personally evaluated the patient during the encounter.  EKG Interpretation  Date/Time:  Thursday July 29 2019 14:20:37 EST Ventricular Rate:  72 PR Interval:  182 QRS Duration: 148 QT Interval:  450 QTC Calculation: 492 R Axis:   -59 Text Interpretation: Undetermined rhythm Left axis deviation Right bundle branch block Inferior infarct , age undetermined Anterior infarct , age undetermined Abnormal ECG No significant change since last tracing Confirmed by Fredia Sorrow [phone]) on 07/29/2019 4:32:08 PM   Results for orders placed or performed during the hospital encounter of 07/29/19  Protime-INR  Result Value Ref Range   Prothrombin Time 26.3 (H) 11.4 - 15.2 seconds   INR 2.4 (H) 0.8 - 1.2  APTT  Result Value Ref Range   aPTT 40 (H) 24 - 36 seconds  CBC  Result Value Ref Range   WBC 9.6 4.0 - 10.5 K/uL   RBC 4.53 4.22 - 5.81 MIL/uL   Hemoglobin 13.7 13.0 - 17.0 g/dL   HCT 43.2 39.0 - 52.0 %   MCV 95.4 80.0 - 100.0 fL   MCH 30.2 26.0 - 34.0 pg   MCHC 31.7 30.0 - 36.0 g/dL   RDW 13.6 11.5 - 15.5 %   Platelets 219 150 - 400 K/uL   nRBC 0.0 0.0 - 0.2 %  Differential  Result Value Ref Range   Neutrophils Relative % 60 %   Neutro Abs 5.8 1.7 - 7.7 K/uL   Lymphocytes Relative 25 %   Lymphs Abs 2.4 0.7 - 4.0 K/uL   Monocytes Relative 7 %   Monocytes Absolute 0.6 0.1 - 1.0 K/uL   Eosinophils Relative 7 %   Eosinophils Absolute 0.6 (H) 0.0 - 0.5 K/uL   Basophils Relative 1 %   Basophils Absolute 0.1 0.0 - 0.1 K/uL   Immature Granulocytes 0 %   Abs Immature Granulocytes 0.03 0.00 - 0.07 K/uL  Comprehensive metabolic panel  Result Value Ref Range   Sodium 135 135 - 145 mmol/L   Potassium 4.2 3.5 - 5.1 mmol/L   Chloride 99 98 - 111 mmol/L   CO2 25 22 - 32 mmol/L   Glucose, Bld 130 (H) 70 - 99 mg/dL   BUN 27 (H) 8 - 23 mg/dL    Creatinine, Ser 1.43 (H) 0.61 - 1.24 mg/dL   Calcium 8.4 (L) 8.9 - 10.3 mg/dL   Total Protein 6.5 6.5 - 8.1 g/dL   Albumin 3.9 3.5 - 5.0 g/dL   AST 21 15 - 41 U/L   ALT 20 0 - 44 U/L   Alkaline Phosphatase 71 38 - 126 U/L   Total Bilirubin 0.5 0.3 - 1.2 mg/dL   GFR calc non Af Amer 49 (L) >60 mL/min   GFR calc Af Amer 57 (L) >60 mL/min   Anion gap 11 5 - 15  I-stat chem 8, ED  Result Value Ref Range   Sodium 135 135 - 145 mmol/L   Potassium 4.1 3.5 - 5.1 mmol/L   Chloride 98 98 - 111 mmol/L   BUN 28 (H) 8 - 23 mg/dL   Creatinine, Ser 1.50 (H) 0.61 - 1.24 mg/dL   Glucose, Bld 120 (H) 70 - 99 mg/dL   Calcium, Ion 1.09 (L) 1.15 - 1.40 mmol/L   TCO2 27 22 - 32 mmol/L   Hemoglobin 14.6 13.0 - 17.0 g/dL   HCT 43.0 39.0 - 52.0 %  CBG monitoring, ED  Result Value Ref Range   Glucose-Capillary 132 (H) 70 - 99 mg/dL   CT Code Stroke CTA Head W/WO contrast  Result Date: 07/02/2019 CLINICAL DATA:  Left-sided weakness yesterday with slurred speech. EXAM: CT ANGIOGRAPHY HEAD AND NECK TECHNIQUE: Multidetector CT imaging of the head and neck was performed using the standard protocol during bolus administration of intravenous contrast. Multiplanar CT image reconstructions and MIPs were obtained to evaluate the vascular anatomy. Carotid stenosis measurements (when applicable) are obtained utilizing NASCET criteria, using the distal internal carotid diameter as the denominator. CONTRAST:  47mL OMNIPAQUE IOHEXOL 350 MG/ML SOLN COMPARISON:  Head CT from earlier today FINDINGS: CT HEAD FINDINGS Brain: Streak artifact along the inner table which limits assessment of cortex for detecting subtle infarcts. No convincing infarct. No hemorrhage, hydrocephalus, or masslike finding Vascular: Arterial findings below Skull: Negative Sinuses: Negative Orbits: Bilateral cataract resection Review of the MIP images confirms the above findings CTA NECK FINDINGS Aortic arch: Atherosclerotic plaque.  Four vessel branching.  Right carotid system: Calcified bulb plaque with no significant stenosis and no ulceration. Left carotid system: Calcified bulb plaque and mild common carotid wall thickening. No flow limiting stenosis or ulceration. Vertebral arteries: The left vertebral artery arises from the arch. Both vertebral arteries are smooth and widely patent to the dura. No proximal subclavian stenosis on the right. Skeleton: Advanced lower cervical and mid cervical disc narrowing with ridging and limited posterior longitudinal ligament ossification (C5 and C6). Degenerative facet spurring is also prominent and diffuse. Other neck: Chronic right maxillary sinusitis Upper chest: Negative Review of the MIP images confirms the above findings CTA HEAD FINDINGS Anterior circulation: Atherosclerotic calcification along the carotid siphons. No flow limiting stenosis. No branch occlusion or aneurysm. Posterior circulation: Moderate atheromatous narrowing of the left V4 segment. Atherosclerotic irregularity of the distal basilar without high-grade narrowing. There is a fetal type left PCA. No flow seen beyond the right P1 segment with tiny collaterals apparently reconstituting reconstitution of right occipital branches. Venous sinuses: Negative Anatomic variants: As above Review of the MIP images confirms the above findings These results were communicated to Dr. Cheral Marker at 11:27 amon 1/15/2021by text page via the [facility] messaging system. IMPRESSION: 1. 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 Acuity is indeterminate. 2. Moderate narrowing of the left V4 segment. 3. Atherosclerosis without significant stenosis in the neck or anterior circulation. Electronically Signed   By: Monte Fantasia M.D.   On: 07/02/2019 11:29   CT HEAD WO CONTRAST  Result Date: 07/29/2019 CLINICAL DATA:  74 year old male with confusion, TIA symptoms today. EXAM: CT HEAD WITHOUT CONTRAST TECHNIQUE: Contiguous axial  images were obtained from the base of the skull through the vertex without intravenous contrast. COMPARISON:  Brain MRI 07/02/2019 and earlier. FINDINGS: Brain: Incidental small midline intracranial lipomas, normal variant. Stable cerebral volume. No intracranial mass effect or ventriculomegaly. Mega cisterna magna, normal variant. Bilateral patchy and confluent white matter hypodensity appears stable. No acute intracranial hemorrhage identified. No cortically based acute infarct identified. No cortical encephalomalacia identified. Vascular: Calcified atherosclerosis at the skull base. No suspicious intracranial vascular hyperdensity. Skull: No acute osseous abnormality identified. Sinuses/Orbits: Chronic right maxillary sinus disease. Other Visualized paranasal sinuses and mastoids are clear. Other: No acute orbit or scalp soft tissue findings. IMPRESSION: 1. Stable non contrast CT appearance of the brain with chronic white matter disease. 2. Chronic right maxillary sinus disease. Electronically Signed   By: Genevie Ann M.D.   On: 07/29/2019 17:34  CT Code Stroke CTA Neck W/WO contrast  Result Date: 07/02/2019 CLINICAL DATA:  Left-sided weakness yesterday with slurred speech. EXAM: CT ANGIOGRAPHY HEAD AND NECK TECHNIQUE: Multidetector CT imaging of the head and neck was performed using the standard protocol during bolus administration of intravenous contrast. Multiplanar CT image reconstructions and MIPs were obtained to evaluate the vascular anatomy. Carotid stenosis measurements (when applicable) are obtained utilizing NASCET criteria, using the distal internal carotid diameter as the denominator. CONTRAST:  72mL OMNIPAQUE IOHEXOL 350 MG/ML SOLN COMPARISON:  Head CT from earlier today FINDINGS: CT HEAD FINDINGS Brain: Streak artifact along the inner table which limits assessment of cortex for detecting subtle infarcts. No convincing infarct. No hemorrhage, hydrocephalus, or masslike finding Vascular: Arterial  findings below Skull: Negative Sinuses: Negative Orbits: Bilateral cataract resection Review of the MIP images confirms the above findings CTA NECK FINDINGS Aortic arch: Atherosclerotic plaque.  Four vessel branching. Right carotid system: Calcified bulb plaque with no significant stenosis and no ulceration. Left carotid system: Calcified bulb plaque and mild common carotid wall thickening. No flow limiting stenosis or ulceration. Vertebral arteries: The left vertebral artery arises from the arch. Both vertebral arteries are smooth and widely patent to the dura. No proximal subclavian stenosis on the right. Skeleton: Advanced lower cervical and mid cervical disc narrowing with ridging and limited posterior longitudinal ligament ossification (C5 and C6). Degenerative facet spurring is also prominent and diffuse. Other neck: Chronic right maxillary sinusitis Upper chest: Negative Review of the MIP images confirms the above findings CTA HEAD FINDINGS Anterior circulation: Atherosclerotic calcification along the carotid siphons. No flow limiting stenosis. No branch occlusion or aneurysm. Posterior circulation: Moderate atheromatous narrowing of the left V4 segment. Atherosclerotic irregularity of the distal basilar without high-grade narrowing. There is a fetal type left PCA. No flow seen beyond the right P1 segment with tiny collaterals apparently reconstituting reconstitution of right occipital branches. Venous sinuses: Negative Anatomic variants: As above Review of the MIP images confirms the above findings These results were communicated to Dr. Cheral Marker at 11:27 amon 1/15/2021by text page via the [facility] messaging system. IMPRESSION: 1. 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 Acuity is indeterminate. 2. Moderate narrowing of the left V4 segment. 3. Atherosclerosis without significant stenosis in the neck or anterior circulation. Electronically Signed   By: Monte Fantasia M.D.   On: 07/02/2019 11:29   MR BRAIN WO CONTRAST  Result Date: 07/02/2019 CLINICAL DATA:  Stroke.  Left-sided weakness EXAM: MRI HEAD WITHOUT CONTRAST TECHNIQUE: Multiplanar, multiecho pulse sequences of the brain and surrounding structures were obtained without intravenous contrast. COMPARISON:  CTA head 07/02/2019 FINDINGS: Brain: Negative for acute infarct. Mild chronic microvascular ischemic changes in the white matter. Prominent cisterna magna. Negative for hemorrhage or mass. No ventricular enlargement or midline shift. Vascular: Normal arterial flow voids Skull and upper cervical spine: No focal skeletal lesions. Sinuses/Orbits: Mucosal edema and air-fluid level right maxillary sinus. Coil edema ethmoid sinuses. Bilateral cataract surgery Other: None IMPRESSION: Negative for acute infarct. Mild chronic microvascular ischemic change in the white matter. Electronically Signed   By: Franchot Gallo M.D.   On: 07/02/2019 14:32   CT CEREBRAL PERFUSION W CONTRAST  Result Date: 07/02/2019 CLINICAL DATA:  Right P1 occlusion on CTA today. Left-sided weakness and slurred speech EXAM: CT PERFUSION BRAIN TECHNIQUE: Multiphase CT imaging of the brain was performed following IV bolus contrast injection. Subsequent parametric perfusion maps were calculated using RAPID software. CONTRAST:  72mL OMNIPAQUE  IOHEXOL 350 MG/ML SOLN COMPARISON:  CT head and CTA 07/02/2019 FINDINGS: CT Brain Perfusion Findings: CBF (<30%) Volume: 21mL Perfusion (Tmax>6.0s) volume: 55mL Mismatch Volume: 52mL ASPECTS on noncontrast CT Head: 10 at 0842 hours today. Infarct Core: 0 mL Infarction Location:None IMPRESSION: Negative CT perfusion.  No evidence of infarct or acute ischemia. Electronically Signed   By: Franchot Gallo M.D.   On: 07/02/2019 12:40   ECHOCARDIOGRAM COMPLETE  Result Date: 07/03/2019   ECHOCARDIOGRAM REPORT   Patient Name:   DR. [name] Date of Exam: 07/02/2019 Medical Rec #:  [id number]               Height:       71.0 in Accession #:    [id number]             Weight:       220.9 lb Date of Birth:  [birth year]               BSA:          2.20 m Patient Age:    38 years               BP:           174/91 mmHg Patient Gender: M                      HR:           67 bpm. Exam Location:  Inpatient Procedure: 2D Echo, Cardiac Doppler and Color Doppler Indications:    Stroke 434.91 / I163.9  History:        Patient has prior history of Echocardiogram examinations, most                 recent 03/19/2019. Cardiomyopathy, CAD, Defibrillator; Risk                 Factors:Hypertension, Diabetes and Sleep Apnea. CKD. GERD.  Sonographer:    Jonelle Sidle Dance Referring Phys: [place]  1. Left ventricular ejection fraction, by visual estimation, is 55 to 60%. The left ventricle has normal function. There is mildly increased left ventricular hypertrophy.  2. Left ventricular diastolic parameters are indeterminate.  3. Global right ventricle has normal systolic function.The right ventricular size is normal.  4. The mitral valve is normal in structure. No evidence of mitral valve regurgitation.  5. The aortic valve is tricuspid. Aortic valve regurgitation is mild. No evidence of aortic valve stenosis.  6. The tricuspid valve is normal in structure.  7. The pulmonic valve was not well visualized. Pulmonic valve regurgitation is not visualized.  8. Left atrial size was normal.  9. Right atrial size was normal. 10. The inferior vena cava is normal in size with greater than 50% respiratory variability, suggesting right atrial pressure of 3 mmHg. 11. The tricuspid regurgitant velocity is 2.09 m/s, and with an assumed right atrial pressure of 3 mmHg, the estimated right ventricular systolic pressure is normal at 20.5 mmHg. 12. There is mild dilatation of the aortic root measuring 40 mm.There is dilatation of the ascending aorta measuring 39 mm FINDINGS  Left Ventricle: Left ventricular ejection fraction, by visual  estimation, is 55 to 60%. The left ventricle has normal function. The left ventricle has no regional wall motion abnormalities. There is mildly increased left ventricular hypertrophy. Left ventricular diastolic parameters are indeterminate. Right Ventricle: The right ventricular size is normal. No increase in right ventricular wall thickness. Global RV systolic  function is has normal systolic function. The tricuspid regurgitant velocity is 2.09 m/s, and with an assumed right atrial pressure  of 3 mmHg, the estimated right ventricular systolic pressure is normal at 20.5 mmHg. Left Atrium: Left atrial size was normal in size. Right Atrium: Right atrial size was normal in size Pericardium: There is no evidence of pericardial effusion. Mitral Valve: The mitral valve is normal in structure. No evidence of mitral valve regurgitation. Tricuspid Valve: The tricuspid valve is normal in structure. Tricuspid valve regurgitation is mild. Aortic Valve: The aortic valve is tricuspid. Aortic valve regurgitation is mild. Aortic regurgitation PHT measures 634 msec. The aortic valve is structurally normal, with no evidence of sclerosis or stenosis. Pulmonic Valve: The pulmonic valve was not well visualized. Pulmonic valve regurgitation is not visualized. Pulmonic regurgitation is not visualized. Aorta: Aortic dilatation noted. There is mild dilatation of the aortic root measuring 40 mm. Venous: The inferior vena cava is normal in size with greater than 50% respiratory variability, suggesting right atrial pressure of 3 mmHg. IAS/Shunts: The interatrial septum was not well visualized.  LEFT VENTRICLE PLAX 2D LVIDd:         5.20 cm LVIDs:         3.50 cm LV PW:         1.30 cm LV IVS:        1.10 cm LVOT diam:     2.50 cm LV SV:         79 ml LV SV Index:   34.70 LVOT Area:     4.91 cm  RIGHT VENTRICLE             IVC RV Basal diam:  3.10 cm     IVC diam: 1.90 cm RV Mid diam:    3.20 cm RV S prime:     10.30 cm/s TAPSE (M-mode): 2.2 cm  LEFT ATRIUM             Index       RIGHT ATRIUM           Index LA diam:        4.20 cm 1.91 cm/m  RA Area:     15.40 cm LA Vol (A2C):   59.4 ml 27.01 ml/m RA Volume:   30.30 ml  13.78 ml/m LA Vol (A4C):   44.6 ml 20.28 ml/m LA Biplane Vol: 51.7 ml 23.50 ml/m  AORTIC VALVE LVOT Vmax:   89.70 cm/s LVOT Vmean:  56.600 cm/s LVOT VTI:    0.204 m AI PHT:      634 msec  AORTA Ao Root diam: 4.00 cm Ao Asc diam:  3.90 cm MITRAL VALVE                         TRICUSPID VALVE MV Area (PHT): 2.99 cm              TR Peak grad:   17.5 mmHg MV PHT:        73.66 msec            TR Vmax:        209.00 cm/s MV Decel Time: 254 msec MV E velocity: 89.30 cm/s  103 cm/s  SHUNTS MV A velocity: 104.00 cm/s 70.3 cm/s Systemic VTI:  0.20 m MV E/A ratio:  0.86        1.5       Systemic Diam: 2.50 cm  Oswaldo Milian MD Electronically signed by Oswaldo Milian  MD Signature Date/Time: 07/03/2019/1:21:00 AM    Final    CT HEAD CODE STROKE WO CONTRAST  Result Date: 07/02/2019 CLINICAL DATA:  Code stroke. Neuro deficit, acute, stroke suspected. Additional history provided: Last known well 20:30. Left-sided deficits and confusion. EXAM: CT HEAD WITHOUT CONTRAST TECHNIQUE: Contiguous axial images were obtained from the base of the skull through the vertex without intravenous contrast. COMPARISON:  No pertinent prior studies available for comparison. FINDINGS: Brain: No evidence of acute intracranial hemorrhage. No demarcated cortical infarction. No evidence of intracranial mass. No midline shift or extra-axial fluid collection. Mild ill-defined hypoattenuation within the cerebral white matter is nonspecific, but consistent with chronic small vessel ischemic disease. Small symmetric focal hypodensities within the inferior basal ganglia likely reflect prominent perivascular spaces. Mild generalized parenchymal atrophy. 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 Vascular: No hyperdense vessel.   Atherosclerotic calcifications. Skull: Normal. Negative for fracture or focal lesion. Sinuses/Orbits: Visualized orbits demonstrate no acute abnormality. Mild ethmoid sinus mucosal thickening. Moderate right maxillary sinus mucosal thickening. No significant mastoid effusion. These results were communicated to Dr. Cheral Marker at 8:53 amon 1/15/2021by text page via the [facility] messaging system. IMPRESSION: No evidence of acute intracranial abnormality. Mild generalized parenchymal atrophy and chronic small vessel ischemic disease. Paranasal sinus disease as described. Electronically Signed   By: Kellie Simmering DO   On: 07/02/2019 08:54   Patient was admitted January 15 for acute ischemic stroke.  Patient is currently being followed by [facility] neurology.  Patient was at the office today and there was an episode of confusion.  Really did not have any speech problems did not have any motor weakness or any sensory problems it lasted only about 30 minutes and then it resolved.  Patient neuro exam here without any acute findings CT of the head without any acute changes.  We discussed with Dr. Saralyn Pilar on-call neurology here today and he said that he does not need any further work-up at this point in time.  That his work-up was complete from January.  Patient to return for any new or worse symptoms.  Patient stable for discharge home and follow-up with [place] neurology.    Fredia Sorrow, MD 07/29/19 1756

## 2019-07-30 ENCOUNTER — Other Ambulatory Visit (HOSPITAL_COMMUNITY): Payer: Self-pay | Admitting: *Deleted

## 2019-08-04 ENCOUNTER — Ambulatory Visit (INDEPENDENT_AMBULATORY_CARE_PROVIDER_SITE_OTHER): Payer: BC Managed Care – PPO | Admitting: Internal Medicine

## 2019-08-04 ENCOUNTER — Other Ambulatory Visit: Payer: Self-pay

## 2019-08-04 ENCOUNTER — Encounter: Payer: Self-pay | Admitting: Internal Medicine

## 2019-08-04 VITALS — BP 142/84 | HR 73 | Ht 71.0 in | Wt 209.6 lb

## 2019-08-04 DIAGNOSIS — I639 Cerebral infarction, unspecified: Secondary | ICD-10-CM

## 2019-08-04 DIAGNOSIS — I420 Dilated cardiomyopathy: Secondary | ICD-10-CM | POA: Diagnosis not present

## 2019-08-04 DIAGNOSIS — I493 Ventricular premature depolarization: Secondary | ICD-10-CM | POA: Diagnosis not present

## 2019-08-04 DIAGNOSIS — I5022 Chronic systolic (congestive) heart failure: Secondary | ICD-10-CM | POA: Diagnosis not present

## 2019-08-04 DIAGNOSIS — I4891 Unspecified atrial fibrillation: Secondary | ICD-10-CM

## 2019-08-04 NOTE — Progress Notes (Signed)
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 He had a history of presyncope and left ventricular dysfunction Ejection fraction was noted to be about 20-30%. ( Most recently 10/15>>45-55%)   EP testing demonstrated sustained ventricular arrhythmia  DEvice reached ERI 2020 and was elected not to replace     He has frequent PVCs. 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 6/14  He underwent ablation and mapping Dr Greggory Brandy which was unfortunately unsuccessful, with thoughts perhaps related to epicardial focus  Since last visit, he was seen by [facility] again for consideration of redo VT/PVC  ablation. He had VT mapping in 4/16, mapped to inferior septum. He had LHC the next day, showing 90% distal LAD and 90% PDA. He had DES to both lesions. He was sent home on ASA 325, Plavix 75, and Xarelto 20. Plan was to return 1 year after DES placement to have epicardial VT ablation    Ventricular ectopy continues also to be an issue he will be following up with [facility] in March. Anote from 10/17 describes a failed epicardial /coronary sinus endocardial mapping with failure to suppress PVCs. There was a discussion of sotalol but there is concern regarding aggravation of underlying LV dysfunction. He was treated with beta blockers and Entresto.  Admitted 1/21 with a TIA.  Imaging was not conclusive for acute infarct but there was evidence of age-indeterminate right occipital infarct.  He was given an event recorder which demonstrated atrial fibrillation/flutter.  Xarelto was initiated.  Plavix was discontinued.  Seen 1 week ago with again transient aphasia.  Symptoms resolved rapidly and spontaneously.   Imaging studies were negative.   Date PVCs  2011/ About 30%  7/19 15%   DATE TEST EF   2001   20-30 %   10/15 Echo   45-55 %   1/16 Echo  30-35%   10/18 Echo  40-45%   1/21 Echo  55-60% LA size normal   Date Cr K Hgb  4/19 1.56 4.3 11.5   2/21 1.50 4.1 14.6   Chronic edema.  Dyspnea at baseline.  No chest pain.  No palpitations.   Event recorder personally reviewed.  Atrial fibrillation confirmed.  Duration not yet known study ongoing  Past Medical History:  Diagnosis Date  . Aftercare following removal/replacement defibrillator    a. defibrillator removed 03/2019.  Marland Kitchen Arthritis    "thumbs, neck, knees" (06/01/2013)  . Cardiomyopathy, dilated, nonischemic ([place]) 10/20/2012  . Cataract   . Chronic systolic heart failure ([place])   . CKD (chronic kidney disease), stage III   . Coronary artery disease    stent placed  . DVT (deep vein thrombosis) in pregnancy   . 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  . Erectile dysfunction   . GERD (gastroesophageal reflux disease)   . Hearing aid worn    B/L  . Hypertension   . Hypogonadism male   . Hypovitaminosis D   . 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 NICM (nonischemic cardiomyopathy) ([place])   . Sleep apnea    cpap  . Type II diabetes mellitus ([place])   . Ventricular  tachycardia ([place])    observed during stress testing, subsequent VT on EPS  . Wears glasses     Past Surgical History:  Procedure Laterality Date  . ABLATION  06-01-2013   PVC's ablated along basal inferoseptal RV by Dr Rayann Heman  . APPENDECTOMY  1969  . CARDIAC CATHETERIZATION  04/2010  . CARDIAC DEFIBRILLATOR PLACEMENT  05/09/2010   MDT ICD implanted in [place] by Dr Ky Barban  . CATARACT EXTRACTION W/ INTRAOCULAR LENS  IMPLANT, BILATERAL Bilateral   . ICD LEAD REMOVAL N/A 03/19/2019   Procedure: ICD LEAD AND GENERATOR REMOVAL;  Surgeon: Evans Lance, MD;  Location: [place];  Service: Cardiovascular;   Laterality: N/A;  . KNEE ARTHROSCOPY Left 1984  . LUMBAR LAMINECTOMY  ~ 2004  . TONSILLECTOMY AND ADENOIDECTOMY  1972  . TOTAL KNEE ARTHROPLASTY Left 04/28/2017  . TOTAL KNEE ARTHROPLASTY Left 04/28/2017   Procedure: TOTAL KNEE ARTHROPLASTY LEFT;  Surgeon: Dorna Leitz, MD;  Location: [place];  Service: Orthopedics;  Laterality: Left;  . TOTAL KNEE ARTHROPLASTY Right 05/07/2019   Procedure: TOTAL KNEE ARTHROPLASTY;  Surgeon: Dorna Leitz, MD;  [facility];  Service: Orthopedics;  Laterality: Right;  . V-TACH ABLATION N/A 06/01/2013   Procedure: V-TACH ABLATION;  Surgeon: Coralyn Mark, MD;  Location: [place] CATH [facility];  Service: Cardiovascular;  Laterality: N/A;  . VASECTOMY    . VENTRICULAR ABLATION SURGERY  06/01/2013    Current Outpatient Medications  Medication Sig Dispense Refill  . acetaminophen (TYLENOL) 500 MG tablet Take 1,000 mg by mouth every 6 (six) hours as needed for mild pain, moderate pain or headache.     . Acetylcysteine 600 MG CAPS Take 600 mg by mouth 2 (two) times daily.    . Ascorbic Acid (VITAMIN C) 1000 MG tablet Take 1,000 mg by mouth 2 (two) times daily.    . Blood Glucose Monitoring Suppl (ONETOUCH VERIO) w/Device KIT Use to check blood sugar 3 times a day. 1 kit 0  . carvedilol (COREG) 6.25 MG tablet TAKE 1&1/2 TABLETS BY MOUTH TWO TIMES DAILY 270 tablet 3  . Cholecalciferol (VITAMIN D3) 50 MCG (2000 UT) TABS Take 4,000 Units by mouth 2 (two) times daily.    Marland Kitchen docusate sodium (COLACE) 100 MG capsule Take 1 capsule (100 mg total) by mouth 2 (two) times daily. 30 capsule 0  . Dulaglutide (TRULICITY) 1.5 WL/8.9HT SOPN INJECT 1.5MG (1 PEN ) INTO THE SKIN ONCE A WEEK (REPLACE VICTOZA) 2 mL 3  . empagliflozin (JARDIANCE) 10 MG TABS tablet Take 10 mg by mouth daily. 90 tablet 2  . glucose blood (ONETOUCH VERIO) test strip Use to check blood sugar 3 times a day. 200 each 12  . Insulin Glargine (LANTUS SOLOSTAR) 100 UNIT/ML Solostar Pen Inject 34 Units into the skin at  bedtime. 10 pen 2  . Insulin Pen Needle (UNIFINE PENTIPS) 32G X 4 MM MISC USE TWICE DAILY WITH LANTUS AND VICTOZA 100 each 11  . Lancets MISC Glucose monitor to check blood sugars tid dx code E11.9 brand per insurance coverage 300 each 1  . losartan (COZAAR) 25 MG tablet Take 1 tablet (25 mg total) by mouth daily. 90 tablet 3  . metFORMIN (GLUCOPHAGE-XR) 500 MG 24 hr tablet TAKE 4 TABLETS BY MOUTH DAILY WITH BREAKFAST 360 tablet 1  . Quercetin 250 MG TABS Take 500 mg by mouth daily.    . rivaroxaban (XARELTO) 20 MG TABS tablet Take 1 tablet (20 mg total) by mouth daily with supper. 90 tablet  3  . rosuvastatin (CRESTOR) 10 MG tablet TAKE 1 TABLET (10 MG TOTAL) BY MOUTH DAILY. 30 tablet 6  . spironolactone (ALDACTONE) 25 MG tablet Take 1 tablet (25 mg total) by mouth daily. 90 tablet 3  . tamsulosin (FLOMAX) 0.4 MG CAPS capsule Take 0.4 mg by mouth at bedtime.     Marland Kitchen zinc gluconate 50 MG tablet Take 50 mg by mouth 2 (two) times daily.     No current facility-administered medications for this visit.    Allergies  Allergen Reactions  . Lisinopril Shortness Of Breath, Swelling and Other (See Comments)    Angioedema     Review of Systems negative except from HPI and PMH  Physical Exam BP (!) 142/84   Pulse 73   Ht '5\' 11"'  (1.803 m)   Wt 209 lb 9.6 oz (95.1 kg)   SpO2 97%   BMI 29.23 kg/m  Well developed and well nourished in no acute distress HENT normal Neck supple with JVP-flat Clear Device pocket well healed; without hematoma or erythema.  There is no tethering  Regular rate and rhythm, no   gallop No  murmur Abd-soft with active BS No Clubbing cyanosis 2 edema Skin-warm and dry A & Oriented  Grossly normal sensory and motor function  ECG personally reviewed from 07/29/2019 sinus rhythm at 72 Intervals 18/15/45 Right bundle branch block    Assessment and  Plan  PVCs/Ventricular tach-nonsustained   Cardiomyopathy-NICM  Coronary artery disease status post stenting [facility]  2016   CHF chronic   diastolic/systolic status stable  ICD-[employer] device abandoned  Diminished R-wave  SCAF-duration unknown  TIA-recurrent expressive aphasia  Anticoagulation  Await reassessment of PVC burden.  In hospital telemetry from 07/02/2019 reviewed and frequent PVCs were present.  Currently on anticoagulation.  Discussed my preference for Eliquis.  Concerning regarding interval event.  Have spoken to Dr. Leonie Man for his input agree is reasonable to switch to apixaban given the logistical limitations of rivaroxaban  On Anticoagulation;  No bleeding issues     Without symptoms of ischemia  Chronic edema

## 2019-08-04 NOTE — Patient Instructions (Signed)
Medication Instructions:  Your physician recommends that you continue on your current medications as directed. Please refer to the Current Medication list given to you today.  *If you need a refill on your cardiac medications before your next appointment, please call your pharmacy*  Lab Work: None ordered.  If you have labs (blood work) drawn today and your tests are completely normal, you will receive your results only by: Marland Kitchen MyChart Message (if you have MyChart) OR . A paper copy in the mail If you have any lab test that is abnormal or we need to change your treatment, we will call you to review the results.  Testing/Procedures: None ordered.   Follow-Up: At [facility], you and your health needs are our priority.  As part of our continuing mission to provide you with exceptional heart care, we have created designated Provider Care Teams.  These Care Teams include your primary Cardiologist (physician) and Advanced Practice Providers (APPs -  Physician Assistants and Nurse Practitioners) who all work together to provide you with the care you need, when you need it.  Your next appointment:  6 months   The format for your next appointment:  In person   Provider:  Dr Caryl Comes

## 2019-08-06 ENCOUNTER — Telehealth: Payer: Self-pay

## 2019-08-06 NOTE — Telephone Encounter (Signed)
Spoke with pt and advised per Dr Caryl Comes, he and Dr Leonie Man are in agreement pt should take Eliquis based on his recurrent episodes.  Pt verbalizes understanding and states he has a 90 day supply.  Pt agrees with current plan.

## 2019-08-12 MED FILL — TAMSULOSIN HCL 0.4 MG CAP: 0.4 | 30 days supply | Qty: 30 | Fill #9

## 2019-09-07 ENCOUNTER — Other Ambulatory Visit: Payer: Self-pay | Admitting: Cardiology

## 2019-09-07 ENCOUNTER — Telehealth: Payer: Self-pay | Admitting: *Deleted

## 2019-09-07 DIAGNOSIS — G459 Transient cerebral ischemic attack, unspecified: Secondary | ICD-10-CM

## 2019-09-07 DIAGNOSIS — I4891 Unspecified atrial fibrillation: Secondary | ICD-10-CM

## 2019-09-07 NOTE — Telephone Encounter (Signed)
metlife form completed to JM/NP for review and signature.

## 2019-09-07 NOTE — Telephone Encounter (Signed)
Reviewed and signed

## 2019-09-08 ENCOUNTER — Other Ambulatory Visit: Payer: Self-pay

## 2019-09-08 NOTE — Telephone Encounter (Signed)
To MR. 

## 2019-09-09 ENCOUNTER — Encounter: Payer: Self-pay | Admitting: Internal Medicine

## 2019-09-09 ENCOUNTER — Ambulatory Visit (INDEPENDENT_AMBULATORY_CARE_PROVIDER_SITE_OTHER): Payer: BC Managed Care – PPO | Admitting: Internal Medicine

## 2019-09-09 VITALS — BP 150/80 | HR 48 | Ht 71.0 in | Wt 200.0 lb

## 2019-09-09 DIAGNOSIS — E785 Hyperlipidemia, unspecified: Secondary | ICD-10-CM

## 2019-09-09 DIAGNOSIS — E1142 Type 2 diabetes mellitus with diabetic polyneuropathy: Secondary | ICD-10-CM | POA: Diagnosis not present

## 2019-09-09 DIAGNOSIS — E291 Testicular hypofunction: Secondary | ICD-10-CM | POA: Diagnosis not present

## 2019-09-09 DIAGNOSIS — E1165 Type 2 diabetes mellitus with hyperglycemia: Secondary | ICD-10-CM

## 2019-09-09 DIAGNOSIS — I639 Cerebral infarction, unspecified: Secondary | ICD-10-CM

## 2019-09-09 LAB — POCT GLYCOSYLATED HEMOGLOBIN (HGB A1C): Hemoglobin A1C: 7.4 % — AB (ref 4.0–5.6)

## 2019-09-09 MED ORDER — ROSUVASTATIN CALCIUM 10 MG PO TABS: 10.0000 mg | ORAL_TABLET | Freq: Every day | ORAL | 3 refills | Status: DC

## 2019-09-09 MED ORDER — TRULICITY 3 MG/0.5ML ~~LOC~~ SOAJ: 3.0000 mg | SUBCUTANEOUS | 3 refills | Status: DC

## 2019-09-09 NOTE — Patient Instructions (Addendum)
Please continue: - Metformin 2000 mg with breakfast - Jardiance 10 mg before b'fast - Lantus to 22-24 units at bedtime  Please increase: - Trulicity 3 mg weekly  Please return in 6 months with your sugar log.

## 2019-09-09 NOTE — Addendum Note (Signed)
Addended by: Cardell Peach I on: 09/09/2019 02:53 PM   Modules accepted: Orders

## 2019-09-09 NOTE — Progress Notes (Signed)
Patient ID: Paul [name], male   DOB: 11/06/1945, 74 y.o.   MRN: [id number]  This visit occurred during the SARS-CoV-2 public health emergency.  Safety protocols were in place, including screening questions prior to the visit, additional usage of staff PPE, and extensive cleaning of exam room while observing appropriate contact time as indicated for disinfecting solutions.   HPI: Paul [name] is a 74 y.o.-year-old male, returning for f/u for DM2, dx in 2001, uncontrolled, with complications (non-iCMP - CHF, cerebro-vascular ds - s/p TIA, CKD, mild PN, ED, DR).  Last visit 6 months ago.  He had 2x TIA 06/2019, 6 weeks post his R TKR. He had an event monitor - no arrhythmia. On Xarelto.  DM2: Reviewed HbA1c levels: Lab Results  Component Value Date   HGBA1C 7.0 (H) 05/07/2019   HGBA1C 6.8 (A) 02/25/2019   HGBA1C 7.3 07/23/2018  09/23/2014: 8.8%  Pt is on a regimen of: - Metformin XR 2000 mg in a.m.  stopped due to increased urination and dehydration >> Jardiance 10 (started 08/2018) - Victoza 1.8  mg daily in am >> Trulicity 1.5 mg - Lantus 38 >> 40 >> 36-38 >> 42 >> 22-24 units at bedtime  He stopped Amaryl before our visit in 02/2019.  Pt checks his sugars 3 times a day: - am: 87, 110-180 >> 56, 63-189 >> ave 105; 56, 76-176 >> 79-177 - 2h after b'fast: n/c  - before lunch: n/c >> 221 (overcorrection of 56) >> n/c - 2h after lunch: n/c - before dinner: 274 >> n/c >> 85-179 >> 79-134 >> n/c >> 110-190, 265 - 2h after dinner: n/c - bedtime:  123-182, 228 >> 117-148, 229 >> [place]  - nighttime: n/c >> 65, 70s 2x a week >> n/c Lowest sugar was 56 >> 79; he has hypoglycemia awareness in the 70s. Highest sugar was 229 >> 176 >> 265  Glucometer: OneTouch Ultra  Pt's meals are: - Breakfast: cereals + blueberries - Lunch: chinese food or sandwich, fruit - Dinner: meat + veggies or salad - Snacks: no  He used Nutrisystem 2x before >> lost 40 lbs. since last visit, he lost 21  pounds.  He is reducing carbs.  -He has CKD: Lab Results  Component Value Date   BUN 28 (H) 07/29/2019   CREATININE 1.50 (H) 07/29/2019  07/23/2018: 32/1.57, GFR 44, glucose 142 02/19/2017: 19/1.37, GFR 52, glucose 143 09/23/2014: 13/1.1, GFR >60 Angioedema with ACEI.  [name] was not covered. 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  Dr. Midge Aver and Dr. Zigmund Daniel. -He has numbness and tingling in his left foot  H/o hypogonadism He was previously on AndroGel but this stopped being covered >> he had to stop the medication but testosterone levels were normal afterwards 07/23/2018: Free testosterone 32.7 (6-73) Component     Latest Ref Rng & Units 01/20/2016  Testosterone,Total,LC/MS/MS     250 - 1,100 ng/dL 336  Testosterone, Free     35.0 - 155.0 pg/mL 66.2  Sex Hormone Binding Glob.     22 - 77 nmol/L 27   PSA levels were normal: Lab Results  Component Value Date   PSA 0.9 01/20/2016   PSA 1.50 07/06/2015   PSA 1.16 10/16/2012   Patient has a history of trigeminy, status post endocardial ablation at [facility]. He had an epicardial ablation in 08/2014. During that time, he had 2 stents latest, and the ablation procedure was postponed. He is followed by Dr. Dwana Curd.  He has OSA and is on CPAP.  He has a history of DVT in the left leg, which is more swollen. He continues on Coreg, Lasix, spironolactone.  He is seen in the CHF clinic.  ROS: Constitutional: no weight gain/+ weight loss, no fatigue, no subjective hyperthermia, no subjective hypothermia Eyes: no blurry vision, no xerophthalmia ENT: no sore throat, no  nodules palpated in neck, no dysphagia, no odynophagia, no hoarseness Cardiovascular: no CP/no SOB/no palpitations/+ leg swelling Respiratory: no cough/no SOB/no wheezing Gastrointestinal: no N/no V/no D/no C/no acid reflux Musculoskeletal: no muscle aches/no joint aches Skin: no rashes, no hair loss Neurological: no tremors/+ numbness/+ tingling/no dizziness  I reviewed pt's medications, allergies, PMH, social hx, family hx, and changes were documented in the history of present illness. Otherwise, unchanged from my initial visit note.  Past Medical History:  Diagnosis Date  . Aftercare following removal/replacement defibrillator    a. defibrillator removed 03/2019.  Marland Kitchen Arthritis    "thumbs, neck, knees" (06/01/2013)  . Cardiomyopathy, dilated, nonischemic ([place]) 10/20/2012  . Cataract   . Chronic systolic heart failure ([place])   . CKD (chronic kidney disease), stage III   . Coronary artery disease    stent placed  . DVT (deep vein thrombosis) in pregnancy   . 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  . Erectile dysfunction   . GERD (gastroesophageal reflux disease)   . Hearing aid worn    B/L  . Hypertension   . Hypogonadism male   . Hypovitaminosis D   . 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 NICM (nonischemic cardiomyopathy) ([place])   . Sleep apnea    cpap  . Type II diabetes mellitus ([place])   . Ventricular tachycardia ([place])    observed during stress testing, subsequent VT on EPS  . Wears glasses    Past Surgical History:  Procedure Laterality Date  . ABLATION  06-01-2013   PVC's ablated along basal inferoseptal RV by Dr Rayann Heman  . APPENDECTOMY  1969  . CARDIAC CATHETERIZATION  04/2010  . CARDIAC DEFIBRILLATOR PLACEMENT  05/09/2010   MDT ICD implanted in [place] by Dr Ky Barban  . CATARACT EXTRACTION W/ INTRAOCULAR LENS  IMPLANT, BILATERAL Bilateral   . ICD LEAD REMOVAL N/A 03/19/2019   Procedure: ICD LEAD AND  GENERATOR REMOVAL;  Surgeon: Evans Lance, MD;  Location: [place];  Service: Cardiovascular;  Laterality: N/A;  . KNEE ARTHROSCOPY Left 1984  . LUMBAR LAMINECTOMY  ~ 2004  . TONSILLECTOMY AND ADENOIDECTOMY  1972  . TOTAL KNEE ARTHROPLASTY Left 04/28/2017  . TOTAL KNEE ARTHROPLASTY Left 04/28/2017   Procedure: TOTAL KNEE ARTHROPLASTY LEFT;  Surgeon: Dorna Leitz, MD;  Location: [place];  Service: Orthopedics;  Laterality: Left;  . TOTAL KNEE ARTHROPLASTY Right 05/07/2019   Procedure: TOTAL KNEE ARTHROPLASTY;  Surgeon: Dorna Leitz, MD;  [facility];  Service: Orthopedics;  Laterality: Right;  . V-TACH ABLATION N/A 06/01/2013   Procedure: V-TACH ABLATION;  Surgeon: Coralyn Mark, MD;  Location: [place] CATH LAB;  Service: Cardiovascular;  Laterality: N/A;  . VASECTOMY    . VENTRICULAR ABLATION SURGERY  06/01/2013   History   Social History  . Marital Status: Married    Spouse Name: N/A  . Number of Children: 2   Occupational History  . physician   Social History Main Topics  . Smoking status: Never Smoker   . Smokeless tobacco: Never Used  . Alcohol Use: 1.2 oz/week    1 Glasses scotch, rarely  . 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    Moved here from [facility]  1.5 years ago   Current Outpatient Medications on File Prior to Visit  Medication Sig Dispense Refill  . acetaminophen (TYLENOL) 500 MG tablet Take 1,000 mg by mouth every 6 (six) hours as needed for mild pain, moderate pain or headache.     . Acetylcysteine 600 MG CAPS Take 600 mg by mouth 2 (two) times daily.    . Ascorbic Acid (VITAMIN C) 1000 MG tablet Take 1,000 mg by mouth 2 (two) times daily.    . Blood Glucose Monitoring Suppl (ONETOUCH VERIO) w/Device KIT Use to check blood sugar 3 times a day. 1 kit 0  . carvedilol (COREG) 6.25 MG tablet TAKE 1&1/2 TABLETS BY MOUTH TWO TIMES DAILY 270 tablet 3  .  Cholecalciferol (VITAMIN D3) 50 MCG (2000 UT) TABS Take 4,000 Units by mouth 2 (two) times daily.    Marland Kitchen docusate sodium (COLACE) 100 MG capsule Take 1 capsule (100 mg total) by mouth 2 (two) times daily. 30 capsule 0  . Dulaglutide (TRULICITY) 1.5 BL/3.9QZ SOPN INJECT 1.5MG (1 PEN ) INTO THE SKIN ONCE A WEEK (REPLACE VICTOZA) 2 mL 3  . empagliflozin (JARDIANCE) 10 MG TABS tablet Take 10 mg by mouth daily. 90 tablet 2  . glucose blood (ONETOUCH VERIO) test strip Use to check blood sugar 3 times a day. 200 each 12  . Insulin Glargine (LANTUS SOLOSTAR) 100 UNIT/ML Solostar Pen Inject 34 Units into the skin at bedtime. 10 pen 2  . Insulin Pen Needle (UNIFINE PENTIPS) 32G X 4 MM MISC USE TWICE DAILY WITH LANTUS AND VICTOZA 100 each 11  . Lancets MISC Glucose monitor to check blood sugars tid dx code E11.9 brand per insurance coverage 300 each 1  . losartan (COZAAR) 25 MG tablet Take 1 tablet (25 mg total) by mouth daily. 90 tablet 3  . metFORMIN (GLUCOPHAGE-XR) 500 MG 24 hr tablet TAKE 4 TABLETS BY MOUTH DAILY WITH BREAKFAST 360 tablet 1  . Quercetin 250 MG TABS Take 500 mg by mouth daily.    . rivaroxaban (XARELTO) 20 MG TABS tablet Take 1 tablet (20 mg total) by mouth daily with supper. 90 tablet 3  . rosuvastatin (CRESTOR) 10 MG tablet TAKE 1 TABLET (10 MG TOTAL) BY MOUTH DAILY. 30 tablet 6  . spironolactone (ALDACTONE) 25 MG tablet Take 1 tablet (25 mg total) by mouth daily. 90 tablet 3  . tamsulosin (FLOMAX) 0.4 MG CAPS capsule Take 0.4 mg by mouth at bedtime.     Marland Kitchen zinc gluconate 50 MG tablet Take 50 mg by mouth 2 (two) times daily.     No current facility-administered medications on file prior to visit.   Allergies  Allergen Reactions  . Lisinopril Shortness Of Breath, Swelling and Other (See Comments)    Angioedema    Family History  Problem Relation Age of Onset  . Alcohol abuse Mother   . COPD Mother   . Depression Mother   .  Heart disease Mother   . Hypertension Mother   . Stroke  Mother   . COPD Father   . Heart disease Father   . Hypertension Father   . Stroke Father   . Cancer Father   . Diabetes Sister   . Mental retardation Sister   . Learning disabilities Sister   . Hypertension Sister   . Alcohol abuse Brother   . Drug abuse Brother   . Heart disease Brother   . Hypertension Brother   . Coronary artery disease Brother    PE: BP (!) 150/80   Pulse (!) 48   Ht '5\' 11"'  (1.803 m)   Wt 200 lb (90.7 kg)   SpO2 97%   BMI 27.89 kg/m  Body mass index is 27.89 kg/m. Wt Readings from Last 3 Encounters:  09/09/19 200 lb (90.7 kg)  08/04/19 209 lb 9.6 oz (95.1 kg)  07/29/19 208 lb 15.9 oz (94.8 kg)   Constitutional: slightly overweight, in NAD Eyes: PERRLA, EOMI, no exophthalmos ENT: moist mucous membranes, no thyromegaly, no cervical lymphadenopathy Cardiovascular: RRR, No MRG Respiratory: CTA B Gastrointestinal: abdomen soft, NT, ND, BS+ Musculoskeletal: no deformities, strength intact in all 4 Skin: moist, warm, no rashes Neurological: no tremor with outstretched hands, DTR normal in all 4  ASSESSMENT: 1. DM2, insulin-dependent, uncontrolled, with complications - non-iCMP - CHF - cerebro-vascular ds. - s/p TIA x2 06/2019 - CKD - DR - mild PN - ED  2.  History of hypogonadism  3. HL  PLAN:  1. Patient with longstanding, uncontrolled, type 2 diabetes, on a complex medication regimen with Metformin, SGLT2 inhibitor, weekly GLP-1 receptor agonist and daily long-acting insulin.  He has reasonable control which sugars improved in the last year.  Before last.  At that time, we did not restarted as his sugars were better.  At that time HbA1c was 6.8%.He had another HbA1c in 04/2019 which was higher, at 7.0%. -He lost 21 pounds since last visit, which is excellent -At this visit, he tells me that he reduced the Lantus to almost half of the previous dose.  Sugars are close to goal, but with occasional spikes especially after eating out or having more  starches/carbs.  He is mostly on a low-carb diet which he follows along with his wife.  At this visit, we discussed about increasing the Trulicity dose to help with the hyperglycemic spikes, but continue the same dose of Lantus and the rest of his regimen.  We are using half maximal dose of SGLT2 inhibitor due to his decreased kidney function. - I suggested to:  Patient Instructions  Please continue: - Metformin 2000 mg with breakfast - Jardiance 10 mg before b'fast - Lantus 22-24 units at bedtime  Please increase: - Trulicity 3 mg weekly  Please return in 6 months with your sugar log.   - we checked his HbA1c: 7.4% (higher) - advised to check sugars at different times of the day - 1-2x a day, rotating check times - advised for yearly eye exams >> he is UTD - return to clinic in 6 months    2.  History of hypogonadism -Previously on AndroGel, however, this was not covered for 2 years so he is now off testosterone supplementation. -Latest testosterone from 07/2018 reviewed and this was normal -No further investigation needed for now  3. HL -Reviewed latest lipid panel from 06/2019: Excellent LDL and triglycerides, slightly low HDL - continues Crestor w/o SEs >> refilled today  Philemon Kingdom, MD PhD [facility] Heckler  Endocrinology

## 2019-09-15 MED FILL — TAMSULOSIN HCL 0.4 MG CAP: 0.4 | 30 days supply | Qty: 30 | Fill #10

## 2019-10-16 MED FILL — metFORMIN HCL ER 500 MG TB2: 500 | 30 days supply | Qty: 120 | Fill #1

## 2019-10-19 ENCOUNTER — Other Ambulatory Visit (HOSPITAL_COMMUNITY): Payer: Self-pay | Admitting: Family Medicine

## 2019-10-19 MED FILL — TAMSULOSIN HCL 0.4 MG CAP: 0.4 | 90 days supply | Qty: 90 | Fill #0

## 2019-11-17 MED FILL — metFORMIN HCL ER 500 MG TB2: 500 | 30 days supply | Qty: 120 | Fill #2

## 2019-11-23 ENCOUNTER — Ambulatory Visit (INDEPENDENT_AMBULATORY_CARE_PROVIDER_SITE_OTHER): Payer: BC Managed Care – PPO | Admitting: Adult Health

## 2019-11-23 ENCOUNTER — Encounter: Payer: Self-pay | Admitting: Adult Health

## 2019-11-23 VITALS — BP 115/75 | HR 69 | Ht 71.0 in | Wt 202.0 lb

## 2019-11-23 DIAGNOSIS — G459 Transient cerebral ischemic attack, unspecified: Secondary | ICD-10-CM

## 2019-11-23 DIAGNOSIS — I4891 Unspecified atrial fibrillation: Secondary | ICD-10-CM

## 2019-11-23 DIAGNOSIS — I10 Essential (primary) hypertension: Secondary | ICD-10-CM | POA: Diagnosis not present

## 2019-11-23 DIAGNOSIS — Z794 Long term (current) use of insulin: Secondary | ICD-10-CM | POA: Diagnosis not present

## 2019-11-23 DIAGNOSIS — E119 Type 2 diabetes mellitus without complications: Secondary | ICD-10-CM

## 2019-11-23 DIAGNOSIS — E785 Hyperlipidemia, unspecified: Secondary | ICD-10-CM

## 2019-11-23 NOTE — Progress Notes (Signed)
[facility] [address] [place]. [place] [address] [phone]       STROKE FOLLOW UP NOTE  [name] Date of Birth:  1945/10/22 Medical Record Number:  [id number]   Reason for Referral:  stroke follow up    CHIEF COMPLAINT:  Chief Complaint  Patient presents with  . Follow-up    rm 9 here for a TIA f/u. Pt is having no new sx.    HPI:  Today, 11/23/2019, Paul [name] returns for stroke follow-up.  He has been doing well since prior visit without residual deficits and denies new or reoccurring stroke/TIA symptoms.  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  He has been doing this without difficulty.  He has continued on Xarelto for secondary stroke prevention and atrial fibrillation as well as Crestor for secondary stroke prevention.  Blood pressure today satisfactory 115/95.  Continues to follow with endocrinology for DM management with reports of recent glucose levels stable.  No concerns at this time.   History provided for reference purposes only Initial visit 07/20/2019 JM: Paul [name] is a 74 year old male who is being seen today for hospital follow-up unaccompanied. He states he has recovered well without new residual visual impairment, cognitive concerns or imbalance. He was evaluated by PT/OT which has since been completed.  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  He also had evaluation last week with Dr. Katy Fitch for HVF testing which patient reports was normal and was released back to driving.  He has returned to driving without difficulty.  Order placed for ST evaluation request OT due to concerns during admission regarding cognitive impairment.  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  Unfortunately, he has not been contacted in regards to speech/cognitive evaluation but is agreeable to proceeding with evaluation and will schedule visit today after today's appointment.  He is eager to return to work and states he has spoke to his supervisor and plans on having assistance/supervision and additional help upon initially returning.  He primarily works with [employer] and [employer].  Evidence of atrial fibrillation on cardiac monitor therefore was initiated on Xarelto by cardiology and discontinuing Plavix.  First dose of Xarelto was yesterday and recommended to continue aspirin for 5 days after starting and then discontinuing.  He has continued on Xarelto without bleeding or bruising.  Continues on Crestor without myalgias.  Blood pressure today 141/78.  Denies new or worsening stroke/TIA symptoms.  Stroke admission 07/02/2019: Paul [name] is a 74 y.o. male with history of cardiomyopathy, VT ablation (implanted defibrillator removed Oct 2020), CHF, CAD, DVT hx, HTN, recent right TKR 6 weeks prior, sleep apnea on CPAP, and DM2 presented on 07/01/2018 with stuterring deficits including left sided weakness, speech difficulties, confusion, and a fall.   Evaluated by stroke team and Dr. 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  He did not receive IV t-PA due to late presentation (>4.5 hours from time of onset).  MRI and CT head did not show acute abnormality and fell  at some point had occipital infarct which is no longer visible on MRI with right PCA distal occlusion and ongoing left-sided peripheral vision deficit.  CTA head/neck showed right P1 occlusion with reconstituted distal vessels and no visible infarct on noncontrast CT with  acuity indeterminate.  CT perfusion negative.  2D echo showed an EF of 55 to 60% without cardiac source of embolus identified.  COVID-19 negative.  Recommended 30-day cardiac event monitor outpatient due to strong suspicion for cardiac source of embolism and to rule out atrial fibrillation.  Previously on aspirin and recommended DAPT for 3 weeks then Plavix alone.  History of HTN stable and restarted home meds Cozaar, Coreg and Aldactone.  LDL 40 and recommend continuation of Crestor 10 mg daily. Controlled DM with A1c 7.0 and ongoing use of Jardiance, Metformin and insulin.  Other stroke risk factors include advanced age, EtOH use, obesity, family history of stroke, CAD, OSA on CPAP, CHF and likely prior stroke.  Other active problems include allergy to lisinopril, mild leukocytosis and CKD.  Evaluated by therapies and recommended outpatient PT/OT due to residual deficits of impaired left hip strength, impaired balance and difficulty with ambulation and discharged home in stable condition on 07/03/2019.    ROS:   14 system review of systems performed and negative with exception of no complaints  PMH:  Past Medical History:  Diagnosis Date  . Aftercare following removal/replacement defibrillator    a. defibrillator removed 03/2019.  Marland Kitchen Arthritis    "thumbs, neck, knees" (06/01/2013)  . Cardiomyopathy, dilated, nonischemic ([place]) 10/20/2012  . Cataract   . Chronic systolic heart failure ([place])   . CKD (chronic kidney disease), stage III   . Coronary artery disease    stent placed  . DVT (deep vein thrombosis) in pregnancy   . 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  . Erectile dysfunction   . GERD (gastroesophageal reflux disease)   . Hearing aid worn    B/L  . Hypertension   . Hypogonadism male   . Hypovitaminosis D   . 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 NICM (nonischemic cardiomyopathy) ([place])   . Sleep apnea    cpap  . Type  II diabetes mellitus ([place])   . Ventricular tachycardia ([place])    observed during stress testing, subsequent VT on EPS  . Wears glasses     PSH:  Past Surgical History:  Procedure Laterality Date  . ABLATION  06-01-2013   PVC's ablated along basal inferoseptal RV by Dr Rayann Heman  . APPENDECTOMY  1969  . CARDIAC CATHETERIZATION  04/2010  . CARDIAC DEFIBRILLATOR PLACEMENT  05/09/2010   MDT ICD implanted in [place] by Dr Ky Barban  . CATARACT EXTRACTION W/ INTRAOCULAR LENS  IMPLANT, BILATERAL Bilateral   . ICD LEAD REMOVAL N/A 03/19/2019   Procedure: ICD LEAD AND GENERATOR REMOVAL;  Surgeon: Evans Lance, MD;  Location: [facility];  Service: Cardiovascular;  Laterality: N/A;  . KNEE ARTHROSCOPY Left 1984  . LUMBAR LAMINECTOMY  ~ 2004  . TONSILLECTOMY AND ADENOIDECTOMY  1972  . TOTAL KNEE ARTHROPLASTY Left 04/28/2017  . TOTAL KNEE ARTHROPLASTY Left 04/28/2017   Procedure: TOTAL KNEE ARTHROPLASTY LEFT;  Surgeon: Dorna Leitz, MD;  Location: [facility];  Service: Orthopedics;  Laterality: Left;  . TOTAL KNEE ARTHROPLASTY Right 05/07/2019   Procedure: TOTAL KNEE ARTHROPLASTY;  Surgeon: Dorna Leitz, MD;  Location: [facility];  Service: Orthopedics;  Laterality: Right;  . V-TACH ABLATION N/A 06/01/2013  Procedure: V-TACH ABLATION;  Surgeon: Coralyn Mark, MD;  Location: [facility];  Service: Cardiovascular;  Laterality: N/A;  . VASECTOMY    . VENTRICULAR ABLATION SURGERY  06/01/2013    Social History:  Social History   Socioeconomic History  . Marital status: Married    Spouse name: Not on file  . Number of children: Not on file  . Years of education: Not on file  . Highest education level: Not on file  Occupational History  . Occupation: Occupational Medicine Physician  Tobacco Use  . Smoking status: Never Smoker  . Smokeless tobacco: Never Used  Substance and Sexual Activity  . Alcohol use: Yes    Alcohol/week: 2.0 standard drinks    Types: 1 Glasses of wine, 1 Cans of beer per week     Comment: social  . Drug use: No  . Sexual activity: Yes  Other Topics Concern  . 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    Moved here from [facility] 1.5 years ago   Social Determinants of Health   Financial Resource Strain:   . Difficulty of Paying Living Expenses:   Food Insecurity:   . Worried About Charity fundraiser in the Last Year:   . Arboriculturist in the Last Year:   Transportation Needs:   . Film/video editor (Medical):   Marland Kitchen Lack of Transportation (Non-Medical):   Physical Activity:   . Days of Exercise per Week:   . Minutes of Exercise per Session:   Stress:   . Feeling of Stress :   Social Connections:   . Frequency of Communication with Friends and Family:   . Frequency of Social Gatherings with Friends and Family:   . Attends Religious Services:   . Active Member of Clubs or Organizations:   . Attends Archivist Meetings:   Marland Kitchen Marital Status:   Intimate Partner Violence:   . Fear of Current or Ex-Partner:   . Emotionally Abused:   Marland Kitchen Physically Abused:   . Sexually Abused:     Family History:  Family History  Problem Relation Age of Onset  . Alcohol abuse Mother   . COPD Mother   . Depression Mother   . Heart disease Mother   . Hypertension Mother   . Stroke Mother   . COPD Father   . Heart disease Father   . Hypertension Father   . Stroke Father   . Cancer Father   . Diabetes Sister   . Mental retardation Sister   . Learning disabilities Sister   . Hypertension Sister   . Alcohol abuse Brother   . Drug abuse Brother   . Heart disease Brother   . Hypertension Brother   . Coronary artery disease Brother     Medications:   Current Outpatient Medications on File Prior to Visit  Medication Sig Dispense Refill  . acetaminophen (TYLENOL) 500 MG tablet Take 1,000 mg by mouth every 6 (six) hours as needed for mild pain, moderate pain  or headache.     . Acetylcysteine 600 MG CAPS Take 600 mg by mouth 2 (two) times daily.    . Ascorbic Acid (VITAMIN C) 1000 MG tablet Take 1,000 mg by mouth 2 (two) times daily.    . Blood Glucose Monitoring Suppl (ONETOUCH VERIO) w/Device KIT Use to check blood sugar 3 times a day. 1 kit  0  . carvedilol (COREG) 6.25 MG tablet TAKE 1&1/2 TABLETS BY MOUTH TWO TIMES DAILY 270 tablet 3  . Cholecalciferol (VITAMIN D3) 50 MCG (2000 UT) TABS Take 4,000 Units by mouth 2 (two) times daily.    Marland Kitchen docusate sodium (COLACE) 100 MG capsule Take 1 capsule (100 mg total) by mouth 2 (two) times daily. 30 capsule 0  . Dulaglutide (TRULICITY) 3 WU/9.8JX SOPN Inject 3 mg into the skin once a week. 12 pen 3  . empagliflozin (JARDIANCE) 10 MG TABS tablet Take 10 mg by mouth daily. 90 tablet 2  . glucose blood (ONETOUCH VERIO) test strip Use to check blood sugar 3 times a day. 200 each 12  . Insulin Glargine (LANTUS SOLOSTAR) 100 UNIT/ML Solostar Pen Inject 34 Units into the skin at bedtime. 10 pen 2  . Insulin Pen Needle (UNIFINE PENTIPS) 32G X 4 MM MISC USE TWICE DAILY WITH LANTUS AND VICTOZA 100 each 11  . Lancets MISC Glucose monitor to check blood sugars tid dx code E11.9 brand per insurance coverage 300 each 1  . metFORMIN (GLUCOPHAGE-XR) 500 MG 24 hr tablet TAKE 4 TABLETS BY MOUTH DAILY WITH BREAKFAST 360 tablet 1  . Quercetin 250 MG TABS Take 500 mg by mouth daily.    . rivaroxaban (XARELTO) 20 MG TABS tablet Take 1 tablet (20 mg total) by mouth daily with supper. 90 tablet 3  . rosuvastatin (CRESTOR) 10 MG tablet Take 1 tablet (10 mg total) by mouth daily. 90 tablet 3  . spironolactone (ALDACTONE) 25 MG tablet Take 1 tablet (25 mg total) by mouth daily. 90 tablet 3  . tamsulosin (FLOMAX) 0.4 MG CAPS capsule Take 0.4 mg by mouth at bedtime.     Marland Kitchen zinc gluconate 50 MG tablet Take 50 mg by mouth 2 (two) times daily.    Marland Kitchen losartan (COZAAR) 25 MG tablet Take 1 tablet (25 mg total) by mouth daily. 90 tablet 3   No  current facility-administered medications on file prior to visit.    Allergies:   Allergies  Allergen Reactions  . Lisinopril Shortness Of Breath, Swelling and Other (See Comments)    Angioedema      Physical Exam  Vitals:   11/23/19 1329  BP: 115/75  Pulse: 69  Weight: 202 lb (91.6 kg)  Height: '5\' 11"'  (1.803 m)   Body mass index is 28.17 kg/m. No exam data present  General: well developed, well nourished,  pleasant elderly Caucasian male, seated, in no evident distress Head: head normocephalic and atraumatic.   Neck: supple with no carotid or supraclavicular bruits Cardiovascular: irregular rate and rhythm, no murmurs Musculoskeletal: no deformity Skin:  no rash/petichiae Vascular:  Normal pulses all extremities   Neurologic Exam Mental Status: Awake and fully alert.  Normal speech and language. Oriented to place and time. Recent and remote memory intact. Attention span, concentration and fund of knowledge appropriate.  Mood and affect appropriate.  Cranial Nerves: Pupils equal, briskly reactive to light. Extraocular movements full without nystagmus. Visual fields full to confrontation. Hearing intact. Facial sensation intact. Face, tongue, palate moves normally and symmetrically.  Motor: Normal bulk and tone. Normal strength in all tested extremity muscles except mild left hip flexor weakness. Sensory.: intact to touch , pinprick , position and vibratory sensation.  Coordination: Rapid alternating movements normal in all extremities. Finger-to-nose and heel-to-shin performed accurately bilaterally. Gait and Station: Arises from chair without difficulty. Stance is normal. Gait demonstrates normal stride length with slight favoring of left leg but  no  imbalance difficulties Reflexes: 1+ and symmetric. Toes downgoing.        ASSESSMENT: Paul [name] is a 74 y.o. year old male presented with stuttering deficits including left-sided weakness, speech difficulties  confusion and a fall on 07/02/2019 likely due to possible right brain TIA.  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  30-day cardiac event monitor showed evidence of atrial fibrillation therefore Xarelto initiated.  Vascular risk factors include cardiomyopathy, VT ablation, CHF, CAD, DVT history, HTN, HLD, DM, right P1 occlusion and likely prior stroke.  Stable from a stroke standpoint without residual deficits.    PLAN:  1. TIA: Continue Xarelto (rivaroxaban) daily  and Crestor for secondary stroke prevention. Maintain strict control of hypertension with blood pressure goal below 130/90, diabetes with hemoglobin A1c goal below 6.5% and cholesterol with LDL cholesterol (bad cholesterol) goal below 70 mg/dL.  I also advised the patient to eat a healthy diet with plenty of whole grains, cereals, fruits and vegetables, exercise regularly with at least 30 minutes of continuous activity daily and maintain ideal body weight. 2. HTN: Stable.  Continue to follow with PCP for monitoring and management 3. HLD: Continuation of Crestor and continue to follow with PCP for prescribing, monitoring and management 4. DMII: Continue to follow with endocrinology for monitoring management 5. New diagnosis A. Fib: Continue Xarelto and ongoing follow-up with cardiology for ongoing monitoring and management   Overall stable from stroke standpoint recommend follow-up as needed   I spent 26 minutes of face-to-face and non-face-to-face time with patient.  This included previsit chart review, lab review, study review, order entry, electronic health record documentation, patient education    Frann Rider, [facility]  [facility] [address] [place], [place] [address]  Phone [phone] Fax [phone] Note: This document was  prepared with digital dictation and possible smart phrase technology. Any transcriptional errors that result from this process are unintentional.

## 2019-11-23 NOTE — Patient Instructions (Signed)
Please follow up as needed as you have been doing well from a stroke stand point. Please continue current regimen and routine follow up with PCP and endocrinology      Thank you for coming to see [place] at [facility]. I hope we have been able to provide you high quality care today.  You may receive a patient satisfaction survey over the next few weeks. We would appreciate your feedback and comments so that we may continue to improve ourselves and the health of our patients.

## 2019-11-29 NOTE — Progress Notes (Signed)
I agree with the above plan 

## 2019-12-18 MED FILL — metFORMIN HCL ER 500 MG TB2: 500 | 30 days supply | Qty: 120 | Fill #3

## 2020-01-21 ENCOUNTER — Other Ambulatory Visit: Payer: Self-pay

## 2020-01-21 ENCOUNTER — Other Ambulatory Visit: Payer: Self-pay | Admitting: Internal Medicine

## 2020-01-21 DIAGNOSIS — E1165 Type 2 diabetes mellitus with hyperglycemia: Secondary | ICD-10-CM

## 2020-01-21 MED ORDER — METFORMIN HCL ER 500 MG PO TB24: ORAL_TABLET | ORAL | 1 refills | Status: DC

## 2020-01-21 MED FILL — TAMSULOSIN HCL 0.4 MG CAP: 0.4 | 90 days supply | Qty: 90 | Fill #1

## 2020-01-21 NOTE — Telephone Encounter (Signed)
Outpatient Medication Detail   Disp Refills Start End   metFORMIN (GLUCOPHAGE-XR) 500 MG 24 hr tablet 360 tablet 1 01/21/2020    Sig: TAKE 4 TABLETS BY MOUTH DAILY WITH BREAKFAST   Sent to pharmacy as: metFORMIN (GLUCOPHAGE-XR) 500 MG 24 hr tablet   E-Prescribing Status: Receipt confirmed by pharmacy (01/21/2020  8:43 AM EDT)    Durene Cal to pharmacy requested. This Rx request is being denied since the refill has been addressed by alternate means.

## 2020-01-21 NOTE — Telephone Encounter (Signed)
Patient called and is requesting that the Metformin refill go to [facility] instead of [facility] and that it be for 90 days.

## 2020-01-24 ENCOUNTER — Other Ambulatory Visit: Payer: Self-pay

## 2020-01-24 ENCOUNTER — Encounter (INDEPENDENT_AMBULATORY_CARE_PROVIDER_SITE_OTHER): Payer: BC Managed Care – PPO | Admitting: Ophthalmology

## 2020-01-24 DIAGNOSIS — I10 Essential (primary) hypertension: Secondary | ICD-10-CM

## 2020-01-24 DIAGNOSIS — H33302 Unspecified retinal break, left eye: Secondary | ICD-10-CM

## 2020-01-24 DIAGNOSIS — E11319 Type 2 diabetes mellitus with unspecified diabetic retinopathy without macular edema: Secondary | ICD-10-CM

## 2020-01-24 DIAGNOSIS — E113293 Type 2 diabetes mellitus with mild nonproliferative diabetic retinopathy without macular edema, bilateral: Secondary | ICD-10-CM

## 2020-01-24 DIAGNOSIS — H35033 Hypertensive retinopathy, bilateral: Secondary | ICD-10-CM | POA: Diagnosis not present

## 2020-01-24 DIAGNOSIS — H43813 Vitreous degeneration, bilateral: Secondary | ICD-10-CM

## 2020-02-25 DIAGNOSIS — Z961 Presence of intraocular lens: Secondary | ICD-10-CM | POA: Diagnosis not present

## 2020-02-25 DIAGNOSIS — H4911 Fourth [trochlear] nerve palsy, right eye: Secondary | ICD-10-CM | POA: Diagnosis not present

## 2020-02-25 DIAGNOSIS — Z794 Long term (current) use of insulin: Secondary | ICD-10-CM | POA: Diagnosis not present

## 2020-02-25 DIAGNOSIS — H04123 Dry eye syndrome of bilateral lacrimal glands: Secondary | ICD-10-CM | POA: Diagnosis not present

## 2020-02-25 DIAGNOSIS — E119 Type 2 diabetes mellitus without complications: Secondary | ICD-10-CM | POA: Diagnosis not present

## 2020-02-25 DIAGNOSIS — H33312 Horseshoe tear of retina without detachment, left eye: Secondary | ICD-10-CM | POA: Diagnosis not present

## 2020-02-25 DIAGNOSIS — H43813 Vitreous degeneration, bilateral: Secondary | ICD-10-CM | POA: Diagnosis not present

## 2020-03-06 NOTE — Progress Notes (Signed)
Kindly inform the patient that cardiac loop recorder study showed atrial fibrillation.  He needs to make an appointment to be seen in the cardiology device clinic to be started on Eliquis for anticoagulation and stop his antiplatelet agent.  Remo Lipps kindly arrange for the same

## 2020-03-07 ENCOUNTER — Telehealth: Payer: Self-pay | Admitting: *Deleted

## 2020-03-07 NOTE — Telephone Encounter (Signed)
Spoke with patient and informed him that Dr Leonie Man stated the cardiac loop recorder study showed atrial fibrillation. He needs to make an appointment to be seen in the cardiology device clinic to be started on Eliquis for anticoagulation and stop his antiplatelet agent. I advised the message was sent to Dr Virl Axe as well. Patient verbalized understanding, appreciation.

## 2020-03-15 ENCOUNTER — Other Ambulatory Visit: Payer: Self-pay

## 2020-03-15 ENCOUNTER — Encounter: Payer: Self-pay | Admitting: Internal Medicine

## 2020-03-15 ENCOUNTER — Ambulatory Visit (INDEPENDENT_AMBULATORY_CARE_PROVIDER_SITE_OTHER): Payer: BC Managed Care – PPO | Admitting: Internal Medicine

## 2020-03-15 VITALS — BP 130/82 | HR 86 | Ht 71.0 in | Wt 202.0 lb

## 2020-03-15 DIAGNOSIS — E1165 Type 2 diabetes mellitus with hyperglycemia: Secondary | ICD-10-CM | POA: Diagnosis not present

## 2020-03-15 DIAGNOSIS — E785 Hyperlipidemia, unspecified: Secondary | ICD-10-CM

## 2020-03-15 DIAGNOSIS — E1142 Type 2 diabetes mellitus with diabetic polyneuropathy: Secondary | ICD-10-CM | POA: Diagnosis not present

## 2020-03-15 DIAGNOSIS — I639 Cerebral infarction, unspecified: Secondary | ICD-10-CM

## 2020-03-15 DIAGNOSIS — E291 Testicular hypofunction: Secondary | ICD-10-CM | POA: Diagnosis not present

## 2020-03-15 LAB — POCT GLYCOSYLATED HEMOGLOBIN (HGB A1C): Hemoglobin A1C: 6.9 % — AB (ref 4.0–5.6)

## 2020-03-15 MED ORDER — EMPAGLIFLOZIN 10 MG PO TABS: 10.0000 mg | ORAL_TABLET | Freq: Every day | ORAL | 3 refills | Status: DC

## 2020-03-15 MED ORDER — TRULICITY 3 MG/0.5ML ~~LOC~~ SOAJ: 3.0000 mg | SUBCUTANEOUS | 3 refills | Status: DC

## 2020-03-15 MED ORDER — METFORMIN HCL ER 500 MG PO TB24: ORAL_TABLET | ORAL | 3 refills | Status: DC

## 2020-03-15 MED ORDER — LANTUS SOLOSTAR 100 UNIT/ML ~~LOC~~ SOPN: 24.0000 [IU] | PEN_INJECTOR | Freq: Every day | SUBCUTANEOUS | 3 refills | Status: DC

## 2020-03-15 NOTE — Addendum Note (Signed)
Addended by: Cardell Peach I on: 03/15/2020 11:47 AM   Modules accepted: Orders

## 2020-03-15 NOTE — Patient Instructions (Signed)
Please continue: - Metformin 2000 mg with breakfast - Jardiance 10 mg before b'fast - Lantus 24-26 units at bedtime - Trulicity 3 mg weekly  Please return in 6 months with your sugar log.

## 2020-03-15 NOTE — Progress Notes (Signed)
Patient ID: Paul [name], male   DOB: May 16, 1946, 74 y.o.   MRN: [id number]  This visit occurred during the SARS-CoV-2 public health emergency.  Safety protocols were in place, including screening questions prior to the visit, additional usage of staff PPE, and extensive cleaning of exam room while observing appropriate contact time as indicated for disinfecting solutions.    HPI: Paul [name] is a 74 y.o.-year-old male, returning for f/u for DM2, dx in 2001, uncontrolled, with complications (non-iCMP - CHF, cerebro-vascular ds - s/p TIA, CKD, mild PN, ED, DR).  Last visit 6 months ago.  DM2: Reviewed HbA1c levels: Lab Results  Component Value Date   HGBA1C 7.4 (A) 09/09/2019   HGBA1C 7.0 (H) 05/07/2019   HGBA1C 6.8 (A) 02/25/2019  09/23/2014: 8.8%  Pt is on a regimen of: - Metformin XR 2000 mg in a.m.  stopped due to increased urination and dehydration >> Jardiance 10 mg in a.m. (started 08/2018) - Victoza 1.8  mg daily in am >> Trulicity 1.5 >> 3 mg - Lantus 38 >> 40 >> 36-38 >> 42 >> 22-24 >> 24-26 units at bedtime  He stopped Amaryl before our visit in 02/2019.  Pt checks his sugars 3 times a day: - am: .56, 63-189 >> ave 105; 56, 76-176 >> 79-177 >> 73, 74 ,90-137, 156, 165 - 2h after b'fast: n/c  - before lunch: n/c >> 221 (overcorrection of 56) >> n/c - 2h after lunch: n/c - before dinner: .85-179 >> 79-134 >> n/c >> 110-190, 265 >> 120-130 - 2h after dinner: n/c - bedtime:  123-182, 228 >> 117-148, 229 >> [name]  - nighttime: n/c >> 65, 70s 2x a week >> n/c Lowest sugar was 56 >> 79 >> 73; he has hypoglycemia awareness in the 70s. Highest sugar was 265 >> 165  Glucometer: OneTouch Ultra  Pt's meals are: - Breakfast: cereals + blueberries - Lunch: chinese food or sandwich, fruit - Dinner: meat + veggies or salad - Snacks: no He used Nutrisystem 2x before >> lost 40 lbs.  At last visit, he was on a low-carb diet along with his wife. He stopped the diet  afterwards, but he is only eating 1 apple for lunch daily.  -+ CKD: Lab Results  Component Value Date   BUN 28 (H) 07/29/2019   CREATININE 1.50 (H) 07/29/2019  07/23/2018: 32/1.57, GFR 44, glucose 142 02/19/2017: 19/1.37, GFR 52, glucose 143 09/23/2014: 13/1.1, GFR >60 02/19/2017: ACR 247 No results found for: MICRALBCREAT  He had angioedema with ACE inhibitors.  [name] was not covered for him.  On losartan 25.  -+ HL; last set of lipids: Lab Results  Component Value Date   CHOL 91 07/03/2019   HDL 34 (L) 07/03/2019   LDLCALC 40 07/03/2019   TRIG 87 07/03/2019   CHOLHDL 2.7 07/03/2019  07/23/2018: 117/135/32/63 02/19/2017: 149/218/30/87  09/2014: 158/157/25/100 Previously on Lipitor 40, currently on Crestor 10. 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  Dr. Midge Aver and Dr. Zigmund Daniel. - + numbness and tingling in his left foot  H/o hypogonadism He was previously on AndroGel but this stopped being covered >> he had to stop the medication with testosterone levels were normal afterwards: 07/23/2018: Free testosterone 32.7 (6-73) Component     Latest Ref Rng & Units 01/20/2016  Testosterone,Total,LC/MS/MS     250 - 1,100 ng/dL 336  Testosterone, Free     35.0 - 155.0 pg/mL 66.2  Sex Hormone Binding Glob.  22 - 77 nmol/L 27   PSA levels were normal: Lab Results  Component Value Date   PSA 0.9 01/20/2016   PSA 1.50 07/06/2015   PSA 1.16 10/16/2012   Patient has a history of trigeminy, status post endocardial ablation at [facility]. He had an epicardial ablation in 08/2014. During that time, he had 2 stents latest, and the ablation procedure was postponed. He is followed by Dr. Dwana Curd.  He has OSA and is on a CPAP.  He has a history of DVT in the left leg, which is more edematous. He continues on Coreg, Lasix, spironolactone.  He is seen in the CHF clinic. He had 2x TIA 06/2019, 6 weeks post his R TKR. He had an event monitor - no arrhythmia. On  Xarelto.  ROS: Constitutional: no weight gain/no weight loss, no fatigue, no subjective hyperthermia, no subjective hypothermia Eyes: no blurry vision, no xerophthalmia ENT: no sore throat, no nodules palpated in neck, no dysphagia, no odynophagia, no hoarseness Cardiovascular: no CP/no SOB/no palpitations/no leg swelling Respiratory: no cough/no SOB/no wheezing Gastrointestinal: no N/no V/no D/no C/no acid reflux Musculoskeletal: no muscle aches/no joint aches Skin: no rashes, no hair loss Neurological: no tremors/+ numbness/+ tingling/no dizziness  I reviewed pt's medications, allergies, PMH, social hx, family hx, and changes were documented in the history of present illness. Otherwise, unchanged from my initial visit note.  Past Medical History:  Diagnosis Date  . Aftercare following removal/replacement defibrillator    a. defibrillator removed 03/2019.  Marland Kitchen Arthritis    "thumbs, neck, knees" (06/01/2013)  . Cardiomyopathy, dilated, nonischemic ([place]) 10/20/2012  . Cataract   . Chronic systolic heart failure ([place])   . CKD (chronic kidney disease), stage III   . Coronary artery disease    stent placed  . DVT (deep vein thrombosis) in pregnancy   . 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  . Erectile dysfunction   . GERD (gastroesophageal reflux disease)   . Hearing aid worn    B/L  . Hypertension   . Hypogonadism male   . Hypovitaminosis D   . 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 NICM (nonischemic cardiomyopathy) ([place])   . Sleep apnea    cpap  . Type II diabetes mellitus ([place])   . Ventricular tachycardia ([place])    observed during stress testing, subsequent VT on EPS  . Wears glasses    Past Surgical History:  Procedure Laterality Date  . ABLATION  06-01-2013   PVC's ablated along basal inferoseptal RV by Dr Rayann Heman  . APPENDECTOMY  1969  . CARDIAC CATHETERIZATION  04/2010  . CARDIAC DEFIBRILLATOR PLACEMENT   05/09/2010   MDT ICD implanted in [place] by Dr Ky Barban  . CATARACT EXTRACTION W/ INTRAOCULAR LENS  IMPLANT, BILATERAL Bilateral   . ICD LEAD REMOVAL N/A 03/19/2019   Procedure: ICD LEAD AND GENERATOR REMOVAL;  Surgeon: Evans Lance, MD;  Location: [place];  Service: Cardiovascular;  Laterality: N/A;  . KNEE ARTHROSCOPY Left 1984  . LUMBAR LAMINECTOMY  ~ 2004  . TONSILLECTOMY AND ADENOIDECTOMY  1972  . TOTAL KNEE ARTHROPLASTY Left 04/28/2017  . TOTAL KNEE ARTHROPLASTY Left 04/28/2017   Procedure: TOTAL KNEE ARTHROPLASTY LEFT;  Surgeon: Dorna Leitz, MD;  Location: [place];  Service: Orthopedics;  Laterality: Left;  . TOTAL KNEE ARTHROPLASTY Right 05/07/2019   Procedure: TOTAL KNEE ARTHROPLASTY;  Surgeon: Dorna Leitz, MD;  [facility];  Service: Orthopedics;  Laterality: Right;  . V-TACH  ABLATION N/A 06/01/2013   Procedure: V-TACH ABLATION;  Surgeon: Coralyn Mark, MD;  Location: [place] CATH LAB;  Service: Cardiovascular;  Laterality: N/A;  . VASECTOMY    . VENTRICULAR ABLATION SURGERY  06/01/2013   History   Social History  . Marital Status: Married    Spouse Name: N/A  . Number of Children: 2   Occupational History  . physician   Social History Main Topics  . Smoking status: Never Smoker   . Smokeless tobacco: Never Used  . Alcohol Use: 1.2 oz/week    1 Glasses scotch, rarely  . 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    Moved here from [facility] [place] 1.5 years ago   Current Outpatient Medications on File Prior to Visit  Medication Sig Dispense Refill  . acetaminophen (TYLENOL) 500 MG tablet Take 1,000 mg by mouth every 6 (six) hours as needed for mild pain, moderate pain or headache.     . Acetylcysteine 600 MG CAPS Take 600 mg by mouth 2 (two) times daily.    . Ascorbic Acid (VITAMIN C) 1000 MG tablet Take 1,000 mg by mouth 2 (two) times daily.    . Blood Glucose Monitoring  Suppl (ONETOUCH VERIO) w/Device KIT Use to check blood sugar 3 times a day. 1 kit 0  . carvedilol (COREG) 6.25 MG tablet TAKE 1&1/2 TABLETS BY MOUTH TWO TIMES DAILY 270 tablet 3  . Cholecalciferol (VITAMIN D3) 50 MCG (2000 UT) TABS Take 4,000 Units by mouth 2 (two) times daily.    Marland Kitchen docusate sodium (COLACE) 100 MG capsule Take 1 capsule (100 mg total) by mouth 2 (two) times daily. 30 capsule 0  . Dulaglutide (TRULICITY) 3 QB/1.6XI SOPN Inject 3 mg into the skin once a week. 12 pen 3  . empagliflozin (JARDIANCE) 10 MG TABS tablet Take 10 mg by mouth daily. 90 tablet 2  . glucose blood (ONETOUCH VERIO) test strip Use to check blood sugar 3 times a day. 200 each 12  . Insulin Glargine (LANTUS SOLOSTAR) 100 UNIT/ML Solostar Pen Inject 34 Units into the skin at bedtime. 10 pen 2  . Insulin Pen Needle (UNIFINE PENTIPS) 32G X 4 MM MISC USE TWICE DAILY WITH LANTUS AND VICTOZA 100 each 11  . Lancets MISC Glucose monitor to check blood sugars tid dx code E11.9 brand per insurance coverage 300 each 1  . losartan (COZAAR) 25 MG tablet Take 1 tablet (25 mg total) by mouth daily. 90 tablet 3  . metFORMIN (GLUCOPHAGE-XR) 500 MG 24 hr tablet TAKE 4 TABLETS BY MOUTH DAILY WITH BREAKFAST 360 tablet 1  . Quercetin 250 MG TABS Take 500 mg by mouth daily.    . rivaroxaban (XARELTO) 20 MG TABS tablet Take 1 tablet (20 mg total) by mouth daily with supper. 90 tablet 3  . rosuvastatin (CRESTOR) 10 MG tablet Take 1 tablet (10 mg total) by mouth daily. 90 tablet 3  . spironolactone (ALDACTONE) 25 MG tablet Take 1 tablet (25 mg total) by mouth daily. 90 tablet 3  . tamsulosin (FLOMAX) 0.4 MG CAPS capsule Take 0.4 mg by mouth at bedtime.     Marland Kitchen zinc gluconate 50 MG tablet Take 50 mg by mouth 2 (two) times daily.     No current facility-administered medications on file prior to visit.   Allergies  Allergen Reactions  . Lisinopril Shortness Of Breath, Swelling and Other (See Comments)  Angioedema    Family History   Problem Relation Age of Onset  . Alcohol abuse Mother   . COPD Mother   . Depression Mother   . Heart disease Mother   . Hypertension Mother   . Stroke Mother   . COPD Father   . Heart disease Father   . Hypertension Father   . Stroke Father   . Cancer Father   . Diabetes Sister   . Mental retardation Sister   . Learning disabilities Sister   . Hypertension Sister   . Alcohol abuse Brother   . Drug abuse Brother   . Heart disease Brother   . Hypertension Brother   . Coronary artery disease Brother    PE: BP 130/82   Pulse 86   Ht _0  (1.803 m)   Wt 202 lb (91.6 kg)   SpO2 98%   BMI 28.17 kg/m  Body mass index is 28.17 kg/m. Wt Readings from Last 3 Encounters:  03/15/20 202 lb (91.6 kg)  11/23/19 202 lb (91.6 kg)  09/09/19 200 lb (90.7 kg)   Constitutional: overweight, in NAD Eyes: PERRLA, EOMI, no exophthalmos ENT: moist mucous membranes, no thyromegaly, no cervical lymphadenopathy Cardiovascular: RRR, No MRG Respiratory: CTA B Gastrointestinal: abdomen soft, NT, ND, BS+ Musculoskeletal: no deformities, strength intact in all 4 Skin: moist, warm, no rashes Neurological: no tremor with outstretched hands, DTR normal in all 4  ASSESSMENT: 1. DM2, insulin-dependent, uncontrolled, with complications - non-iCMP - CHF - cerebro-vascular ds. - s/p TIA x2 06/2019 - CKD - DR - mild PN - ED  2.  History of hypogonadism  3. HL  PLAN:  1. Patient with longstanding, uncontrolled, type 2 diabetes, on a complex medication regimen with Metformin, SGLT2 inhibitor, basal insulin, and weekly GLP-1 receptor increased at last visit, when an HbA1c was higher, at 7.4%.  At that time, he lost 21 pounds intentionally, but sugars were not quite at goal.  He had occasional hyperglycemic spikes especially after eating out or having more starches/carbs.  He was mostly on a low-carb diet which he was following along with his wife.  He had reduced his dose of Lantus prior to the  visit.  At that time, we increased the dose of Trulicity from 1.5 to 3 mg weekly. -At today's visit, sugars are mostly at goal, with the exceptions in the morning after dietary indiscretions at night and also after he missed Trulicity for the last week due to problems with [employer].  He is not checking sugars later in the day. -We will not change his regimen for now since he does not have any significant low or high blood sugars.  I am tempted to advised him to decrease the Lantus if he has any more sugars in the 70s, but for now, there are only few in the last month - I suggested to:  Patient Instructions  Please continue: - Metformin 2000 mg with breakfast - Jardiance 10 mg before b'fast - Lantus 24-26 units at bedtime - Trulicity 3 mg weekly  Please return in 6 months with your sugar log.   - we checked his HbA1c: 6.9% (better) - advised to check sugars at different times of the day - 1-2x a day, rotating check times - advised for yearly eye exams >> he is UTD - return to clinic in 6 months    2.  History of hypogonadism -Previously on AndroGel, however, this was not covered for 2 years so he is now off  testosterone supplementation -Latest testosterone level from 07/2018 was reviewed and this was normal -No further investigation is needed for now  3. HL - Reviewed latest lipid panel from 06/2019: Excellent LDL and triglycerides, slightly low HDL Lab Results  Component Value Date   CHOL 91 07/03/2019   HDL 34 (L) 07/03/2019   LDLCALC 40 07/03/2019   TRIG 87 07/03/2019   CHOLHDL 2.7 07/03/2019  -Continues Crestor 10 without side effects  Philemon Kingdom, MD PhD [facility]

## 2020-03-16 ENCOUNTER — Telehealth (INDEPENDENT_AMBULATORY_CARE_PROVIDER_SITE_OTHER): Payer: BC Managed Care – PPO | Admitting: Internal Medicine

## 2020-03-16 VITALS — BP 130/82 | HR 86 | Wt 202.0 lb

## 2020-03-16 DIAGNOSIS — I472 Ventricular tachycardia: Secondary | ICD-10-CM | POA: Diagnosis not present

## 2020-03-16 DIAGNOSIS — I4729 Other ventricular tachycardia: Secondary | ICD-10-CM

## 2020-03-16 DIAGNOSIS — I420 Dilated cardiomyopathy: Secondary | ICD-10-CM

## 2020-03-16 DIAGNOSIS — I639 Cerebral infarction, unspecified: Secondary | ICD-10-CM

## 2020-03-16 DIAGNOSIS — I5022 Chronic systolic (congestive) heart failure: Secondary | ICD-10-CM | POA: Diagnosis not present

## 2020-03-16 MED ORDER — APIXABAN 5 MG PO TABS: 5.0000 mg | ORAL_TABLET | Freq: Two times a day (BID) | ORAL | 3 refills | Status: DC

## 2020-03-16 NOTE — Patient Instructions (Signed)
Medication Instructions:  Your physician has recommended you make the following change in your medication:   ** Stop your Xarelto  ** Begin taking Eliquis 5mg  - 1 tablet by mouth twice daily. Medication has been sent to pharmacy on file.   *If you need a refill on your cardiac medications before your next appointment, please call your pharmacy*   Lab Work: None ordered.  If you have labs (blood work) drawn today and your tests are completely normal, you will receive your results only by: Marland Kitchen MyChart Message (if you have MyChart) OR . A paper copy in the mail If you have any lab test that is abnormal or we need to change your treatment, we will call you to review the results.   Testing/Procedures: None ordered.    Follow-Up: At [facility], you and your health needs are our priority.  As part of our continuing mission to provide you with exceptional heart care, we have created designated Provider Care Teams.  These Care Teams include your primary Cardiologist (physician) and Advanced Practice Providers (APPs -  Physician Assistants and Nurse Practitioners) who all work together to provide you with the care you need, when you need it.  We recommend signing up for the patient portal called "MyChart".  Sign up information is provided on this After Visit Summary.  MyChart is used to connect with patients for Virtual Visits (Telemedicine).  Patients are able to view lab/test results, encounter notes, upcoming appointments, etc.  Non-urgent messages can be sent to your provider as well.   To learn more about what you can do with MyChart, go to [email or web address].    Your next appointment:   6 months  The format for your next appointment:   In person  Provider:   Dr Caryl Comes

## 2020-03-16 NOTE — Progress Notes (Signed)
Electrophysiology TeleHealth Note   Due to national recommendations of social distancing due to COVID 19, an audio/video telehealth visit is felt to be most appropriate for this patient at this time.  See MyChart message from today for the patient's consent to telehealth for [facility].   Date:  03/16/2020   ID:  Paul [name], DOB [birth year], MRN [id number]  Location: patient's home  Provider location: [address], [place]  Evaluation Performed: Follow-up visit  PCP:  Patient, No Pcp Per  Cardiologist:     Electrophysiologist:  SK   Chief Complaint: SCAF and stroke   History of Present Illness:    Paul [name] is a 74 y.o. male who presents via audio/video conferencing for a telehealth visit today.  Since last being seen in our clinic for  ICD implanted for primary prevention 2/2 inducible VT, and at [facility] he elected to have device removed, followed intercurrently by a stroke for which he underwent event recorder by neuro which showed AFib for total 2hrs, the patient reports doing quite well And lo and behold, the monitor which was officially read just weeks ago came from 1/21 and the afib had been addressed by DOD and me and anticoagulation had been initiated-- discussed Rivaroxaban to [place]  At that time but not ever done    No bleeding issues   No chest pain or dyspnea or edema   The patient denies symptoms of fevers, chills, cough, or new SOB worrisome for COVID 19.     Past Medical History:  Diagnosis Date  . Aftercare following removal/replacement defibrillator    a. defibrillator removed 03/2019.  Marland Kitchen Arthritis    "thumbs, neck, knees" (06/01/2013)  . Cardiomyopathy, dilated, nonischemic ([place]) 10/20/2012  . Cataract   . Chronic systolic heart failure ([place])   . CKD (chronic kidney disease), stage III   . Coronary artery disease    stent placed  . DVT (deep vein thrombosis) in pregnancy   . Dysrhythmia    trigemini for 10 years as per  pt.  . Erectile dysfunction   . GERD (gastroesophageal reflux disease)   . Hearing aid worn    B/L  . Hypertension   . Hypogonadism male   . Hypovitaminosis D   . 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 NICM (nonischemic cardiomyopathy) ([place])   . Sleep apnea    cpap  . Type II diabetes mellitus ([place])   . Ventricular tachycardia ([place])    observed during stress testing, subsequent VT on EPS  . Wears glasses     Past Surgical History:  Procedure Laterality Date  . ABLATION  06-01-2013   PVC's ablated along basal inferoseptal RV by Dr Rayann Heman  . APPENDECTOMY  1969  . CARDIAC CATHETERIZATION  04/2010  . CARDIAC DEFIBRILLATOR PLACEMENT  05/09/2010   MDT ICD implanted in [place] by Dr Ky Barban  . CATARACT EXTRACTION W/ INTRAOCULAR LENS  IMPLANT, BILATERAL Bilateral   . ICD LEAD REMOVAL N/A 03/19/2019   Procedure: ICD LEAD AND GENERATOR REMOVAL;  Surgeon: Evans Lance, MD;  Location: [place];  Service: Cardiovascular;  Laterality: N/A;  . KNEE ARTHROSCOPY Left 1984  . LUMBAR LAMINECTOMY  ~ 2004  . TONSILLECTOMY AND ADENOIDECTOMY  1972  . TOTAL KNEE ARTHROPLASTY Left 04/28/2017  . TOTAL KNEE ARTHROPLASTY Left 04/28/2017   Procedure: TOTAL KNEE ARTHROPLASTY LEFT;  Surgeon: Dorna Leitz, MD;  Location: [place];  Service: Orthopedics;  Laterality: Left;  .  TOTAL KNEE ARTHROPLASTY Right 05/07/2019   Procedure: TOTAL KNEE ARTHROPLASTY;  Surgeon: Graves, John, MD;  [facility];  Service: Orthopedics;  Laterality: Right;  . V-TACH ABLATION N/A 06/01/2013   Procedure: V-TACH ABLATION;  Surgeon: James D Allred, MD;  [facility];  Service: Cardiovascular;  Laterality: N/A;  . VASECTOMY    . VENTRICULAR ABLATION SURGERY  06/01/2013    Current Outpatient Medications  Medication Sig Dispense Refill  . acetaminophen (TYLENOL) 500 MG tablet Take 1,000 mg by mouth every 6 (six) hours as needed for mild pain, moderate pain or headache.      . Acetylcysteine 600 MG CAPS Take 600 mg by mouth 2 (two) times daily.    . Ascorbic Acid (VITAMIN C) 1000 MG tablet Take 1,000 mg by mouth 2 (two) times daily.    . Blood Glucose Monitoring Suppl (ONETOUCH VERIO) w/Device KIT Use to check blood sugar 3 times a day. 1 kit 0  . carvedilol (COREG) 6.25 MG tablet TAKE 1&amp;1/2 TABLETS BY MOUTH TWO TIMES DAILY 270 tablet 3  . Cholecalciferol (VITAMIN D3) 50 MCG (2000 UT) TABS Take 4,000 Units by mouth 2 (two) times daily.    . docusate sodium (COLACE) 100 MG capsule Take 1 capsule (100 mg total) by mouth 2 (two) times daily. 30 capsule 0  . Dulaglutide (TRULICITY) 3 MG/0.5ML SOPN Inject 3 mg into the skin once a week. 6 mL 3  . empagliflozin (JARDIANCE) 10 MG TABS tablet Take 1 tablet (10 mg total) by mouth daily. 90 tablet 3  . glucose blood (ONETOUCH VERIO) test strip Use to check blood sugar 3 times a day. 200 each 12  . insulin glargine (LANTUS SOLOSTAR) 100 UNIT/ML Solostar Pen Inject 24-28 Units into the skin at bedtime. 45 mL 3  . Insulin Pen Needle (UNIFINE PENTIPS) 32G X 4 MM MISC USE TWICE DAILY WITH LANTUS AND VICTOZA 100 each 11  . Lancets MISC Glucose monitor to check blood sugars tid dx code E11.9 brand per insurance coverage 300 each 1  . metFORMIN (GLUCOPHAGE-XR) 500 MG 24 hr tablet TAKE 4 TABLETS BY MOUTH DAILY WITH BREAKFAST 360 tablet 3  . Quercetin 250 MG TABS Take 500 mg by mouth daily.    . rivaroxaban (XARELTO) 20 MG TABS tablet Take 1 tablet (20 mg total) by mouth daily with supper. 90 tablet 3  . rosuvastatin (CRESTOR) 10 MG tablet Take 1 tablet (10 mg total) by mouth daily. 90 tablet 3  . spironolactone (ALDACTONE) 25 MG tablet Take 1 tablet (25 mg total) by mouth daily. 90 tablet 3  . tamsulosin (FLOMAX) 0.4 MG CAPS capsule Take 0.4 mg by mouth at bedtime.     . zinc gluconate 50 MG tablet Take 50 mg by mouth 2 (two) times daily.    . losartan (COZAAR) 25 MG tablet Take 1 tablet (25 mg total) by mouth daily. 90 tablet  3   No current facility-administered medications for this visit.    Allergies:   Lisinopril   Social History:  The patient  reports that he has never smoked. He has never used smokeless tobacco. He reports current alcohol use of about 2.0 standard drinks of alcohol per week. He reports that he does not use drugs.   Family History:  The patient's   family history includes Alcohol abuse in his brother and mother; COPD in his father and mother; Cancer in his father; Coronary artery disease in his brother; Depression in his mother; Diabetes in his   sister; Drug abuse in his brother; Heart disease in his brother, father, and mother; Hypertension in his brother, father, mother, and sister; Learning disabilities in his sister; Mental retardation in his sister; Stroke in his father and mother.   ROS:  Please see the history of present illness.   All other systems are personally reviewed and negative.    Exam:    Vital Signs:  BP 130/82   Pulse 86   Wt 202 lb (91.6 kg)   BMI 28.17 kg/m     Well appearing, alert and conversant, regular work of breathing,  good skin color Eyes- anicteric, neuro- grossly intact, skin- no apparent rash or lesions or cyanosis, mouth- oral mucosa is pink   Labs/Other Tests and Data Reviewed:    Recent Labs: 07/02/2019: TSH 2.574 07/29/2019: ALT 20; BUN 28; Creatinine, Ser 1.50; Hemoglobin 14.6; Platelets 219; Potassium 4.1; Sodium 135   Wt Readings from Last 3 Encounters:  03/16/20 202 lb (91.6 kg)  03/15/20 202 lb (91.6 kg)  11/23/19 202 lb (91.6 kg)     Other studies personally reviewed: Additional studies/ records that were reviewed today include     ASSESSMENT & PLAN:    PVCs/Ventricular tach-nonsustained   Cardiomyopathy-NICM  Coronary artery disease status post stenting [facility] 2016   CHF chronic   diastolic/systolic status stable  ICD-[employer] device abandoned  Diminished R-wave  SCAF-duration unknown  TIA-recurrent expressive  aphasia  Anticoagulation The discussion for anticoagulation for SCAF--    COVID 19 screen The patient denies symptoms of COVID 19 at this time.  The importance of social distancing was discussed today.  Follow-up: 48m    Current medicines are reviewed at length with the patient today.   The patient does not have concerns regarding his medicines.  The following changes were made today:  Stop Rivaroxaban  Begin Apixoban and Rx 90 dys 5 mg bid   Labs/ tests ordered today include:   No orders of the defined types were placed in this encounter.   Future tests ( post COVID )     Patient Risk:  after full review of this patients clinical status, I feel that they are at moderate  risk at this time.  Today, I have spent *16 minutes with the patient with telehealth technology discussing the above.  Signed, SVirl Axe MD  03/16/2020 3:14 PM     [place] [phone]) [phone])

## 2020-04-13 ENCOUNTER — Encounter: Payer: BC Managed Care – PPO | Admitting: Internal Medicine

## 2020-04-26 MED FILL — TAMSULOSIN HCL 0.4 MG CAP: 0.4 | 30 days supply | Qty: 30 | Fill #2

## 2020-05-26 ENCOUNTER — Telehealth: Payer: Self-pay | Admitting: Nurse Practitioner

## 2020-05-26 ENCOUNTER — Other Ambulatory Visit (HOSPITAL_COMMUNITY): Payer: Self-pay | Admitting: Family

## 2020-05-26 ENCOUNTER — Encounter: Payer: Self-pay | Admitting: Nurse Practitioner

## 2020-05-26 DIAGNOSIS — U071 COVID-19: Secondary | ICD-10-CM

## 2020-05-26 NOTE — Telephone Encounter (Signed)
Called to Discuss with patient about Covid symptoms and the use of a monoclonal antibody infusion for those with mild to moderate Covid symptoms and at a high risk of hospitalization.     Pt is qualified for this infusion at the [facility] due to co-morbid conditions and/or a member of an at-risk group.     Unable to reach pt. Left message to return call. Sent [employer].   Beckey Rutter, [place], AGNP-C [phone] ([place])

## 2020-05-26 NOTE — Progress Notes (Signed)
I connected by phone with Paul [name] on 05/26/2020 at 2:09 PM to discuss the potential use of a new treatment for mild to moderate COVID-19 viral infection in non-hospitalized patients.  This patient is a 74 y.o. male that meets the FDA criteria for Emergency Use Authorization of COVID monoclonal antibody casirivimab/imdevimab, bamlanivimab/eteseviamb, or sotrovimab.  Has a (+) direct SARS-CoV-2 viral test result  Has mild or moderate COVID-19   Is NOT hospitalized due to COVID-19  Is within 10 days of symptom onset  Has at least one of the high risk factor(s) for progression to severe COVID-19 and/or hospitalization as defined in EUA.  Specific high risk criteria : Older age (>/= 72 yo), Chronic Kidney Disease (CKD), Diabetes and Cardiovascular disease or hypertension   Symptoms of chills, cough, and fatigue began 05/18/20.   I have spoken and communicated the following to the patient or parent/caregiver regarding COVID monoclonal antibody treatment:  1. FDA has authorized the emergency use for the treatment of mild to moderate COVID-19 in adults and pediatric patients with positive results of direct SARS-CoV-2 viral testing who are 84 years of age and older weighing at least 40 kg, and who are at high risk for progressing to severe COVID-19 and/or hospitalization.  2. The significant known and potential risks and benefits of COVID monoclonal antibody, and the extent to which such potential risks and benefits are unknown.  3. Information on available alternative treatments and the risks and benefits of those alternatives, including clinical trials.  4. Patients treated with COVID monoclonal antibody should continue to self-isolate and use infection control measures (e.g., wear mask, isolate, social distance, avoid sharing personal items, clean and disinfect "high touch" surfaces, and frequent handwashing) according to CDC guidelines.   5. The patient or parent/caregiver has the  option to accept or refuse COVID monoclonal antibody treatment.  After reviewing this information with the patient, the patient has agreed to receive one of the available covid 19 monoclonal antibodies and will be provided an appropriate fact sheet prior to infusion. Asencion Gowda, NP 05/26/2020 2:09 PM

## 2020-05-27 ENCOUNTER — Other Ambulatory Visit (HOSPITAL_COMMUNITY): Payer: Self-pay

## 2020-05-27 ENCOUNTER — Ambulatory Visit (HOSPITAL_COMMUNITY)
Admission: RE | Admit: 2020-05-27 | Discharge: 2020-05-27 | Disposition: A | Discharge status: Home / Self Care | Payer: BC Managed Care – PPO | Source: Ambulatory Visit | Attending: Pulmonary Disease | Admitting: Pulmonary Disease

## 2020-05-27 DIAGNOSIS — U071 COVID-19: Secondary | ICD-10-CM | POA: Insufficient documentation

## 2020-05-27 DIAGNOSIS — Z23 Encounter for immunization: Secondary | ICD-10-CM | POA: Diagnosis not present

## 2020-05-27 MED ORDER — DIPHENHYDRAMINE HCL 50 MG/ML IJ SOLN: 50.0000 mg | Freq: Once | INTRAMUSCULAR | Status: DC | PRN

## 2020-05-27 MED ORDER — METHYLPREDNISOLONE SODIUM SUCC 125 MG IJ SOLR: 125.0000 mg | Freq: Once | INTRAMUSCULAR | Status: DC | PRN

## 2020-05-27 MED ORDER — SODIUM CHLORIDE 0.9 % IV SOLN: Freq: Once | INTRAVENOUS | Status: AC

## 2020-05-27 MED ORDER — EPINEPHRINE 0.3 MG/0.3ML IJ SOAJ: 0.3000 mg | Freq: Once | INTRAMUSCULAR | Status: DC | PRN

## 2020-05-27 MED ORDER — SODIUM CHLORIDE 0.9 % IV SOLN: INTRAVENOUS | Status: DC | PRN

## 2020-05-27 MED ORDER — ALBUTEROL SULFATE HFA 108 (90 BASE) MCG/ACT IN AERS: 2.0000 | INHALATION_SPRAY | Freq: Once | RESPIRATORY_TRACT | Status: DC | PRN

## 2020-05-27 MED ORDER — FAMOTIDINE IN NACL 20-0.9 MG/50ML-% IV SOLN: 20.0000 mg | Freq: Once | INTRAVENOUS | Status: DC | PRN

## 2020-05-27 NOTE — Progress Notes (Signed)
Patient reviewed Fact Sheet for Patients, Parents, and Caregivers for Emergency Use Authorization (EUA) of BAMLANIVIMAB for the Treatment of Coronavirus. Patient also reviewed and is agreeable to the estimated cost of treatment. Patient is agreeable to proceed.

## 2020-05-27 NOTE — Progress Notes (Signed)
  Diagnosis: COVID-19  Physician: Orvilla Fus, MD  Procedure: [facility] Med: bamlanivimab\etesevimab infusion - Provided patient with bamlanimivab\etesevimab fact sheet for patients, parents and caregivers prior to infusion.  Complications: No immediate complications noted.  Discharge: Discharged home   Paul [name] 05/27/2020

## 2020-05-27 NOTE — Discharge Instructions (Signed)
10 Things You Can Do to Manage Your COVID-19 Symptoms at Home If you have possible or confirmed COVID-19: 1. Stay home from work and school. And stay away from other public places. If you must go out, avoid using any kind of public transportation, ridesharing, or taxis. 2. Monitor your symptoms carefully. If your symptoms get worse, call your healthcare provider immediately. 3. Get rest and stay hydrated. 4. If you have a medical appointment, call the healthcare provider ahead of time and tell them that you have or may have COVID-19. 5. For medical emergencies, call 911 and notify the dispatch personnel that you have or may have COVID-19. 6. Cover your cough and sneezes with a tissue or use the inside of your elbow. 7. Wash your hands often with soap and water for at least 20 seconds or clean your hands with an alcohol-based hand sanitizer that contains at least 60% alcohol. 8. As much as possible, stay in a specific room and away from other people in your home. Also, you should use a separate bathroom, if available. If you need to be around other people in or outside of the home, wear a mask. 9. Avoid sharing personal items with other people in your household, like dishes, towels, and bedding. 10. Clean all surfaces that are touched often, like counters, tabletops, and doorknobs. Use household cleaning sprays or wipes according to the label instructions. cdc.gov/coronavirus 12/16/2018 This information is not intended to replace advice given to you by your health care provider. Make sure you discuss any questions you have with your health care provider. Document Revised: 05/20/2019 Document Reviewed: 05/20/2019 Elsevier Patient Education  2020 Elsevier Inc. What types of side effects do monoclonal antibody drugs cause?  Common side effects  In general, the more common side effects caused by monoclonal antibody drugs include: . Allergic reactions, such as hives or itching . Flu-like signs and  symptoms, including chills, fatigue, fever, and muscle aches and pains . Nausea, vomiting . Diarrhea . Skin rashes . Low blood pressure   The CDC is recommending patients who receive monoclonal antibody treatments wait at least 90 days before being vaccinated.  Currently, there are no data on the safety and efficacy of mRNA COVID-19 vaccines in persons who received monoclonal antibodies or convalescent plasma as part of COVID-19 treatment. Based on the estimated half-life of such therapies as well as evidence suggesting that reinfection is uncommon in the 90 days after initial infection, vaccination should be deferred for at least 90 days, as a precautionary measure until additional information becomes available, to avoid interference of the antibody treatment with vaccine-induced immune responses. If you have any questions or concerns after the infusion please call the Advanced Practice Provider on call at [phone]. This number is ONLY intended for your use regarding questions or concerns about the infusion post-treatment side-effects.  Please do not provide this number to others for use. For return to work notes please contact your primary care provider.   If someone you know is interested in receiving treatment please have them call the COVID hotline at [phone].   

## 2020-05-30 MED FILL — TAMSULOSIN HCL 0.4 MG CAP: 0.4 | 30 days supply | Qty: 30 | Fill #3

## 2020-06-26 ENCOUNTER — Encounter: Payer: Self-pay | Admitting: *Deleted

## 2020-06-30 ENCOUNTER — Ambulatory Visit: Payer: BC Managed Care – PPO | Admitting: Internal Medicine

## 2020-07-06 IMAGING — CT CT HEAD W/O CM
4 series · 16 of 47 positions shown, 18 images · non-contrast
Comparison: Brain MRI 07/02/2019 and earlier.

CLINICAL DATA: 73-year-old male with confusion, TIA symptoms today.

EXAM:
CT HEAD WITHOUT CONTRAST
TECHNIQUE: Contiguous axial images were obtained from the base of the skull
through the vertex without intravenous contrast.

[Series 3: head wo · axial · 0.46mm/px · z∈[+1303,+1423]mm · 7 of 33 slices shown, 9 images]
[im 5/33  brain]
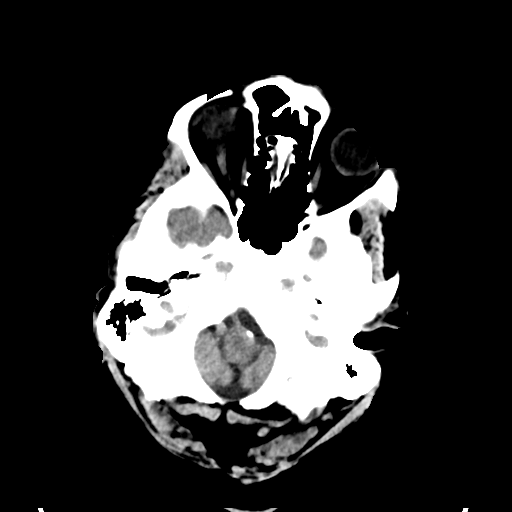
[im 5/33  bone]
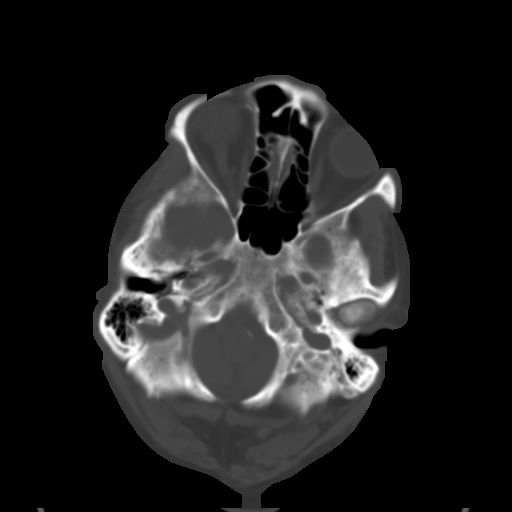
[im 9/33  brain]
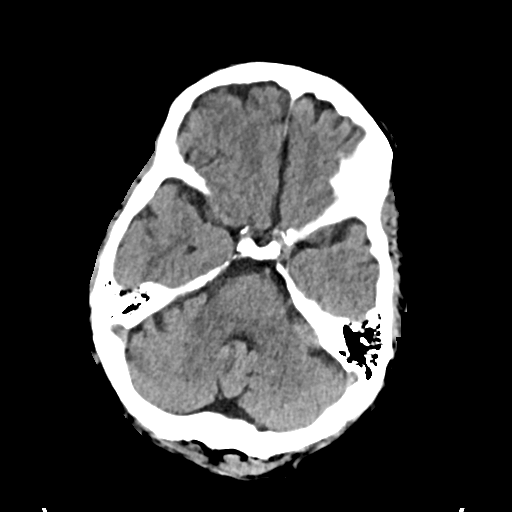
[im 13/33  brain]
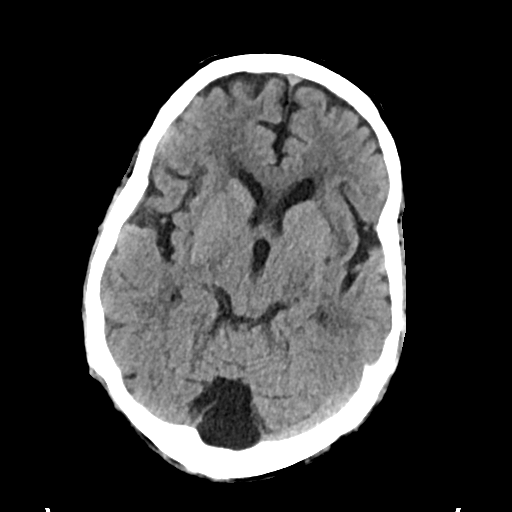
[im 17/33  brain]
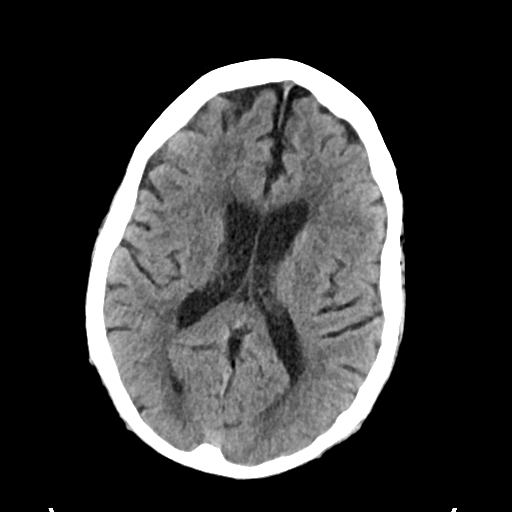
[im 21/33  brain]
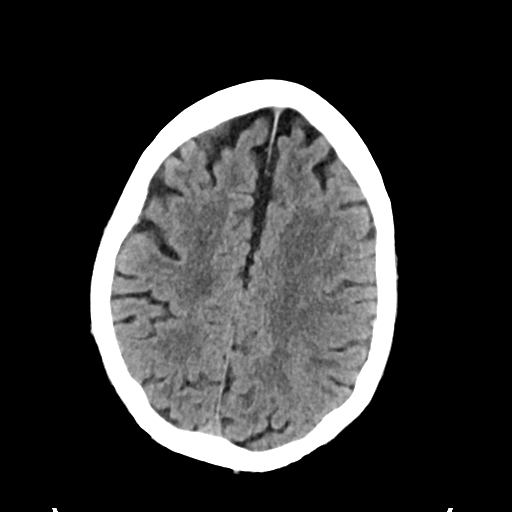
[im 21/33  bone]
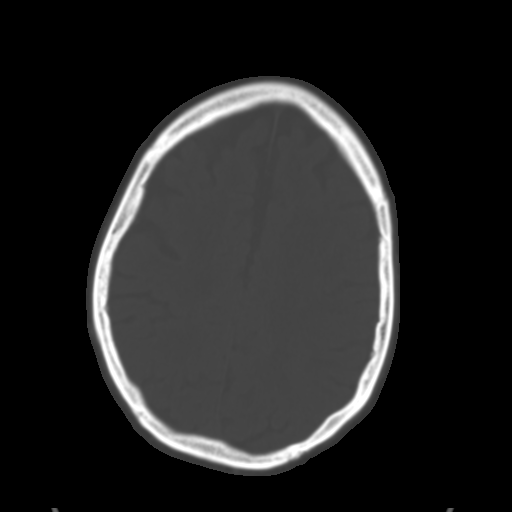
[im 25/33  brain]
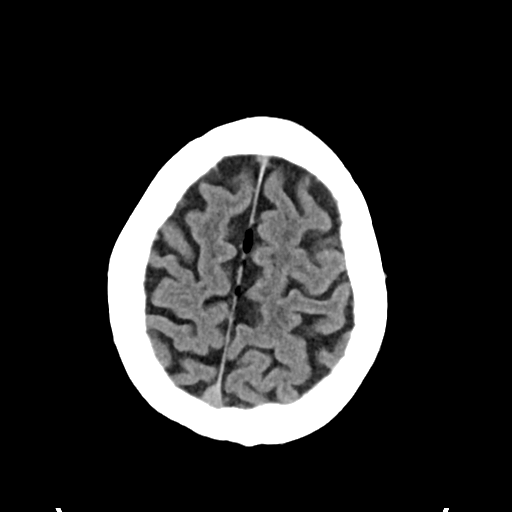
[im 29/33  brain]
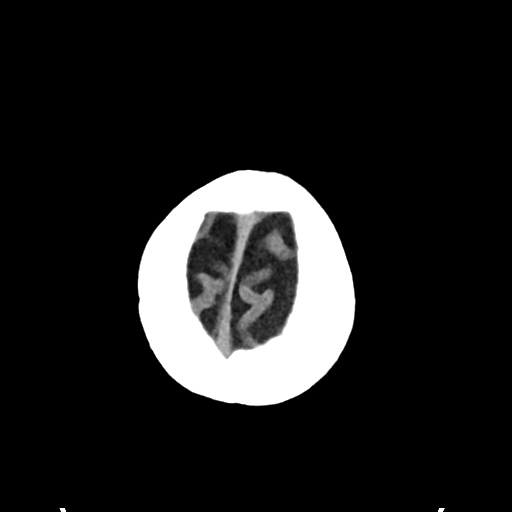

[Series 4: head bone · axial · 0.46mm/px · z∈[+1299,+1331]mm · 3 of 82 slices shown]
[im 9/82  bone]
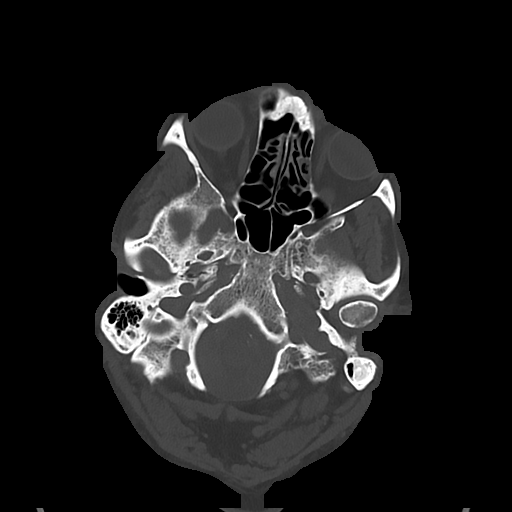
[im 17/82  bone]
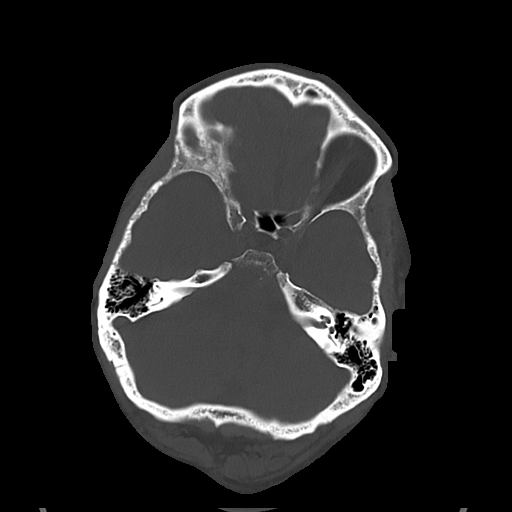
[im 25/82  bone]
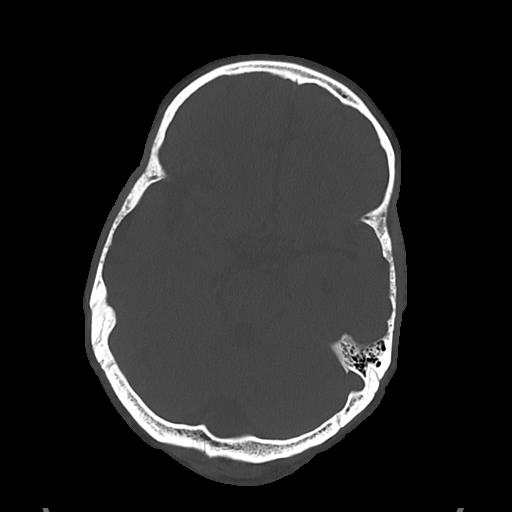

[Series 5: cor soft · coronal · 0.35mm/px · 3 of 72 slices shown]
[im 24/72  brain]
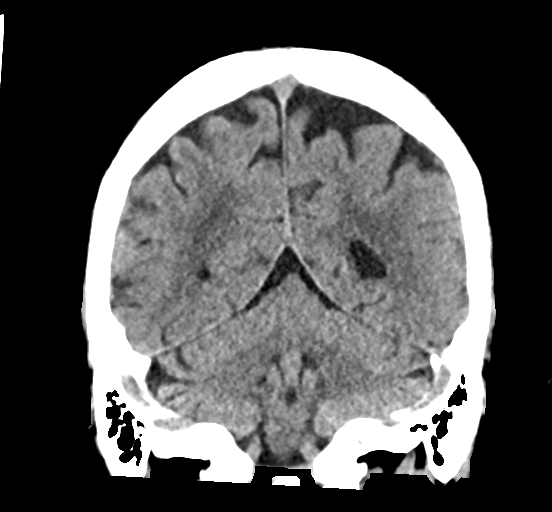
[im 32/72  brain]
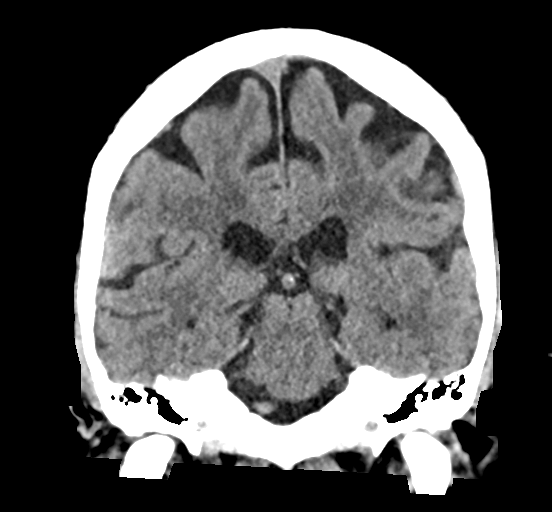
[im 40/72  brain]
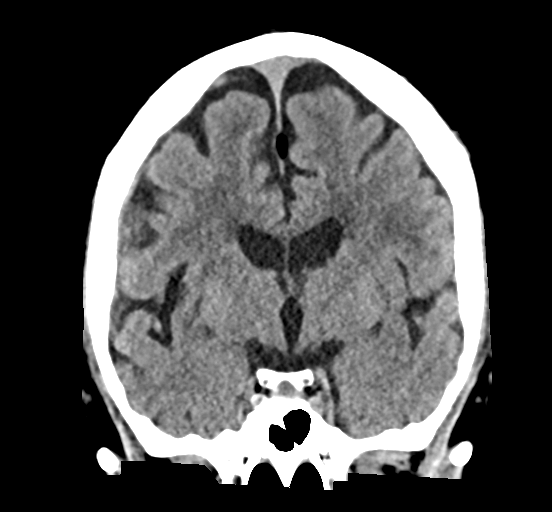

[Series 6: sag soft · sagittal · 0.34mm/px · 3 of 56 slices shown]
[im 19/56  brain]
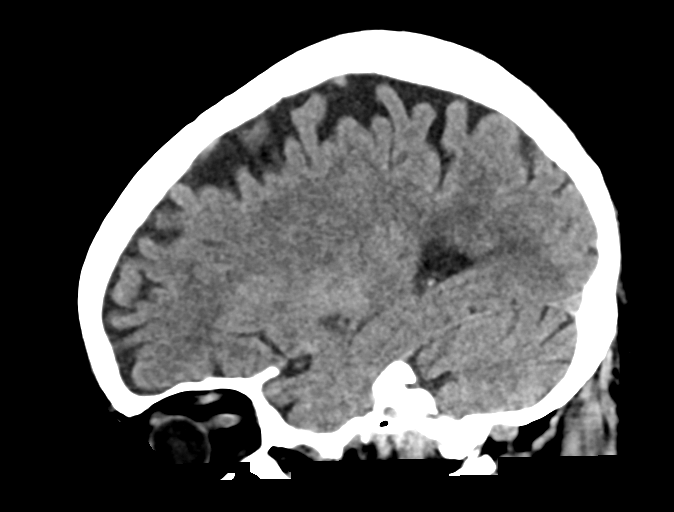
[im 28/56  brain]
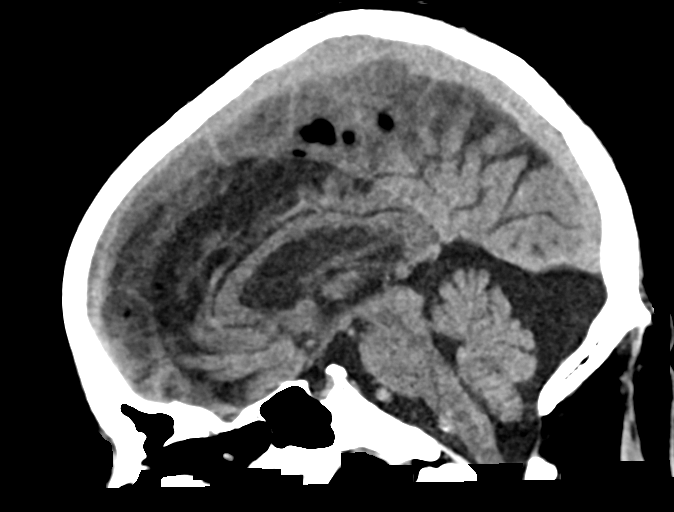
[im 37/56  brain]
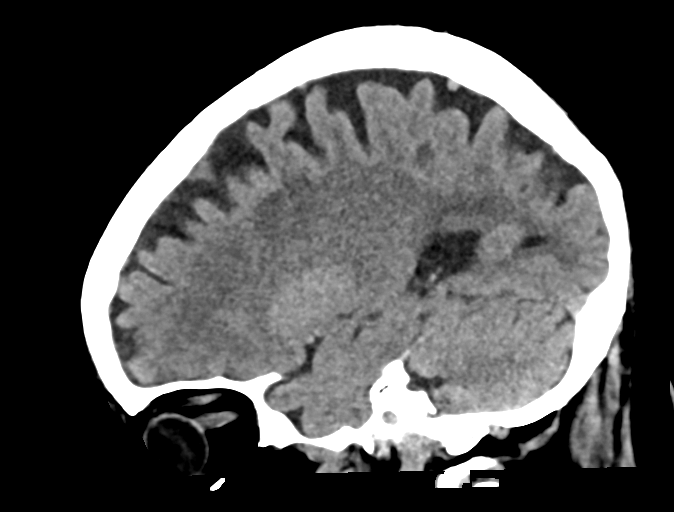

[16 of 47 positions shown; findings below may reference images not displayed]

FINDINGS: Brain: Incidental small midline intracranial lipomas, normal
variant. Stable cerebral volume. No intracranial mass effect or
ventriculomegaly. Mega cisterna magna, normal variant.

Bilateral patchy and confluent white matter hypodensity appears
stable. No acute intracranial hemorrhage identified. No cortically
based acute infarct identified. No cortical encephalomalacia
identified.

Vascular: Calcified atherosclerosis at the skull base. No suspicious
intracranial vascular hyperdensity.

Skull: No acute osseous abnormality identified.

Sinuses/Orbits: Chronic right maxillary sinus disease. Other
Visualized paranasal sinuses and mastoids are clear.

Other: No acute orbit or scalp soft tissue findings.
IMPRESSION: 1. Stable non contrast CT appearance of the brain with chronic white
matter disease.
2. Chronic right maxillary sinus disease.

## 2020-07-06 MED FILL — TAMSULOSIN HCL 0.4 MG CAP: 0.4 | 30 days supply | Qty: 30 | Fill #4

## 2020-07-24 ENCOUNTER — Other Ambulatory Visit: Payer: Self-pay | Admitting: *Deleted

## 2020-07-24 ENCOUNTER — Other Ambulatory Visit (HOSPITAL_COMMUNITY): Payer: Self-pay | Admitting: Internal Medicine

## 2020-07-24 ENCOUNTER — Telehealth (HOSPITAL_COMMUNITY): Payer: Self-pay | Admitting: Cardiology

## 2020-07-24 ENCOUNTER — Telehealth: Payer: Self-pay | Admitting: Internal Medicine

## 2020-07-24 DIAGNOSIS — E1165 Type 2 diabetes mellitus with hyperglycemia: Secondary | ICD-10-CM

## 2020-07-24 DIAGNOSIS — E1142 Type 2 diabetes mellitus with diabetic polyneuropathy: Secondary | ICD-10-CM

## 2020-07-24 MED ORDER — LOSARTAN POTASSIUM 25 MG PO TABS: 25.0000 mg | ORAL_TABLET | Freq: Every day | ORAL | 3 refills | Status: DC

## 2020-07-24 MED ORDER — SPIRONOLACTONE 25 MG PO TABS: 25.0000 mg | ORAL_TABLET | Freq: Every day | ORAL | 3 refills | Status: DC

## 2020-07-24 MED ORDER — METFORMIN HCL ER 500 MG PO TB24: ORAL_TABLET | ORAL | 1 refills | Status: DC

## 2020-07-24 MED FILL — metFORMIN HCL ER 500 MG TB2: 500 | 90 days supply | Qty: 360 | Fill #0

## 2020-07-24 MED FILL — LOSARTAN POTASSIUM 25 MG TA: 25 | 90 days supply | Qty: 90 | Fill #0

## 2020-07-24 MED FILL — SPIRONOLACTONE 25 MG TABS: 25 | 90 days supply | Qty: 90 | Fill #0

## 2020-07-24 NOTE — Telephone Encounter (Signed)
Pt called requesting a refill for Metformin.    PHARMACY: [facility], [place] Phone:  [phone]  Fax:  [phone]

## 2020-07-24 NOTE — Telephone Encounter (Signed)
Pt request spironolactone, and losartan refills, please send scripts to [facility], pt can be reached @[phone]

## 2020-07-24 NOTE — Telephone Encounter (Signed)
Rx sent 

## 2020-08-03 ENCOUNTER — Telehealth: Payer: Self-pay | Admitting: Internal Medicine

## 2020-08-03 DIAGNOSIS — E1142 Type 2 diabetes mellitus with diabetic polyneuropathy: Secondary | ICD-10-CM

## 2020-08-03 NOTE — Telephone Encounter (Signed)
Patient called re: Patient requests all RX's be sent to new Preferred PHARM which is: [facility], [place] Phone:  [phone]  Fax:  [phone]      Also, Patient requests the following new RX's  MEDICATION: empagliflozin (JARDIANCE) 10 MG TABS tablet,  Insulin Pen Needle (UNIFINE PENTIPS) 32G X 4 MM MISC, insulin glargine (LANTUS SOLOSTAR) 100 UNIT/ML Solostar Pen   AND Dulaglutide (TRULICITY) 3 LT/9.0ZE SOPN  PHARMACY:   [facility], [place] Phone:  [phone]  Fax:  [phone]      HAS THE PATIENT CONTACTED [facility]?  Yes  IS THIS A 90 DAY SUPPLY : Yes, All 4 RX's are 90 day supply   IS PATIENT OUT OF MEDICATION: Yes, Out of Jardiance   LAST APPOINTMENT DATE: @2 /12/2020  NEXT APPOINTMENT DATE:@3 /29/2022  DO WE HAVE YOUR PERMISSION TO LEAVE A DETAILED MESSAGE?: Yes  OTHER COMMENTS:    **Let patient know to contact pharmacy at the end of the day to make sure medication is ready. **  ** Please notify patient to allow 48-72 hours to process**  **Encourage patient to contact the pharmacy for refills or they can request refills through [facility]**

## 2020-08-08 ENCOUNTER — Other Ambulatory Visit (HOSPITAL_COMMUNITY): Payer: Self-pay | Admitting: Internal Medicine

## 2020-08-08 MED ORDER — LANTUS SOLOSTAR 100 UNIT/ML ~~LOC~~ SOPN: 24.0000 [IU] | PEN_INJECTOR | Freq: Every day | SUBCUTANEOUS | 3 refills | Status: DC

## 2020-08-08 MED ORDER — UNIFINE PENTIPS 32G X 4 MM MISC: 11 refills | Status: DC

## 2020-08-08 MED ORDER — TRULICITY 3 MG/0.5ML ~~LOC~~ SOAJ: 3.0000 mg | SUBCUTANEOUS | 3 refills | Status: DC

## 2020-08-08 MED ORDER — EMPAGLIFLOZIN 10 MG PO TABS: 10.0000 mg | ORAL_TABLET | Freq: Every day | ORAL | 3 refills | Status: DC

## 2020-08-08 MED FILL — UNIFINE PENTIPS 32GX5/32: 32G X 4 MM | 50 days supply | Qty: 100 | Fill #0

## 2020-08-08 MED FILL — JARDIANCE 10 MG TABLET: 10 | 90 days supply | Qty: 90 | Fill #0

## 2020-08-08 MED FILL — TRULICITY 3 MG/0.5ML SOPN: 3 | 84 days supply | Qty: 6 | Fill #0

## 2020-08-08 MED FILL — TAMSULOSIN HCL 0.4 MG CAP: 0.4 | 30 days supply | Qty: 30 | Fill #5

## 2020-08-08 MED FILL — LANTUS SOLOSTAR 100 UNITS/M: 100 | 85 days supply | Qty: 24 | Fill #0

## 2020-08-08 NOTE — Addendum Note (Signed)
Addended by: Lauralyn Primes on: 08/08/2020 12:25 PM   Modules accepted: Orders

## 2020-08-08 NOTE — Telephone Encounter (Signed)
Patient called to check on status of refill request an change of pharmacy request from 08/03/2020.  Please advise Dr Ouida Sills at [phone]

## 2020-08-08 NOTE — Telephone Encounter (Signed)
Rx sent to preferred pharmacy.

## 2020-08-14 ENCOUNTER — Other Ambulatory Visit (HOSPITAL_COMMUNITY): Payer: Self-pay | Admitting: Internal Medicine

## 2020-08-14 ENCOUNTER — Ambulatory Visit (INDEPENDENT_AMBULATORY_CARE_PROVIDER_SITE_OTHER): Payer: Medicare Other | Admitting: Internal Medicine

## 2020-08-14 ENCOUNTER — Other Ambulatory Visit: Payer: Medicare Other

## 2020-08-14 ENCOUNTER — Encounter: Payer: Self-pay | Admitting: Internal Medicine

## 2020-08-14 ENCOUNTER — Telehealth: Payer: Self-pay

## 2020-08-14 VITALS — BP 110/74 | HR 95 | Ht 71.0 in | Wt 194.8 lb

## 2020-08-14 DIAGNOSIS — R109 Unspecified abdominal pain: Secondary | ICD-10-CM

## 2020-08-14 DIAGNOSIS — R143 Flatulence: Secondary | ICD-10-CM | POA: Diagnosis not present

## 2020-08-14 DIAGNOSIS — R140 Abdominal distension (gaseous): Secondary | ICD-10-CM

## 2020-08-14 DIAGNOSIS — R197 Diarrhea, unspecified: Secondary | ICD-10-CM | POA: Diagnosis not present

## 2020-08-14 MED ORDER — DIPHENOXYLATE-ATROPINE 2.5-0.025 MG PO TABS: 2.0000 | ORAL_TABLET | Freq: Four times a day (QID) | ORAL | 1 refills | Status: DC | PRN

## 2020-08-14 MED ORDER — PLENVU 140 G PO SOLR: 1.0000 | ORAL | 0 refills | Status: DC

## 2020-08-14 MED FILL — DIPHENOXYLATE-ATROPINE 2.5-: 2.5-0.025 | 7 days supply | Qty: 60 | Fill #0

## 2020-08-14 MED FILL — PLENVU 140 GM SOLR: 140 | 1 days supply | Qty: 3 | Fill #0

## 2020-08-14 NOTE — Telephone Encounter (Signed)
Request for surgical clearance:     Endoscopy Procedure  What type of surgery is being performed?     Colonoscopy   When is this surgery scheduled?     08/24/20  What type of clearance is required ?   Pharmacy  Are there any medications that need to be held prior to surgery and how long? Eliquis x2 days   Practice name and name of physician performing surgery?      [facility]-Dr. Hilarie Fredrickson  What is your office phone and fax number?      Phone- [phone]  [phone]  Anesthesia type (None, local, MAC, general) ?       MAC

## 2020-08-14 NOTE — Patient Instructions (Addendum)
You have been scheduled for a colonoscopy. Please follow written instructions given to you at your visit today.  Please pick up your prep supplies at the pharmacy within the next 1-3 days. If you use inhalers (even only as needed), please bring them with you on the day of your procedure.   We have sent the following medications to your pharmacy for you to pick up at your convenience: Plenvu, Lomotil  Your provider has requested that you go to the basement level for lab work before leaving today. Press "B" on the elevator. The lab is located at the first door on the left as you exit the elevator.  You will be contaced by our office prior to your procedure for directions on holding your Eliquis.  If you do not hear from our office 1 week prior to your scheduled procedure, please call [phone] to discuss.  Due to recent changes in healthcare laws, you may see the results of your imaging and laboratory studies on MyChart before your provider has had a chance to review them.  We understand that in some cases there may be results that are confusing or concerning to you. Not all laboratory results come back in the same time frame and the provider may be waiting for multiple results in order to interpret others.  Please give [place] 48 hours in order for your provider to thoroughly review all the results before contacting the office for clarification of your results.   Thank you for choosing me and [facility].  Dr. Ulice Dash [place]

## 2020-08-14 NOTE — Telephone Encounter (Signed)
Patient with diagnosis of atrial fibrillation on Eliquis for anticoagulation.    Procedure: colonoscopy Date of procedure: 08/24/2020   CHA2DS2-VASc Score = 6  This indicates a 9.7% annual risk of stroke. The patient's score is based upon: CHF History: Yes HTN History: Yes Diabetes History: Yes Stroke History: Yes Vascular Disease History: No Age Score: 1 Gender Score: 0   CrCl 55.7 Platelet count 219  GI is asking for 2 day hold of Eliquis for colonoscopy, with intention to do biopsies to exclude colitis.   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    Because of this history will confirm with MD if okay to hold Eliquis x 2 days

## 2020-08-14 NOTE — Progress Notes (Signed)
Patient ID: Paul [name], male   DOB: Aug 15, 1945, 75 y.o.   MRN: [id number] HPI: Paul [name] is a 75 year old male with a history of GERD, fatty liver, cardiomyopathy and CHF status post ICD placement and subsequent removal (EF normal at [facility] in 2021), CAD diabetes, hypertension, sleep apnea, CKD who is seen to evaluate abdominal bloating, diarrhea with fecal urgency and flatulence symptom.  He is here alone today.  He has a GI history in [place], [place] where he had 3 colonoscopies the last being greater than 10 years ago.  He reports on his first colonoscopy had a "couple of polyps" but does not recall any polyps on subsequent colonoscopies.  5 years ago he had a negative Cologuard.  Over the last year but particularly in the last several weeks he has noticed increased abdominal bloating and flatulence symptom.  Initially he took a probiotic and Benefiber which seemed to help the symptom however symptoms recurred in November December and the probiotic and Benefiber did not seem to help as much.  He reports loose stools with flatulence.  Stools can be quite urgent.  Over the last few days he has had diarrhea with urgency.  He had 6-8 bowel movements yesterday and 4 during the night last night.  Stools are nonbloody and nonmelenic.  No recent antibiotics.  No recent travel.  No abdominal pain.  No upper GI or hepatobiliary complaint.  He has tried Imodium with only minimal improvement.  No new medicines of late.  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 No family history of colon cancer.  There is a family history of prostate cancer. No tobacco use.  No alcohol use.    Past Medical History:  Diagnosis Date  . Aftercare following removal/replacement defibrillator    a. defibrillator removed 03/2019.  Marland Kitchen Arthritis    "thumbs, neck, knees" (06/01/2013)  . Cardiomyopathy, dilated, nonischemic ([place]) 10/20/2012  . Cataract   . Chronic systolic heart  failure ([place])   . CKD (chronic kidney disease), stage III ([place])   . Coronary artery disease    stent placed  . DVT (deep vein thrombosis) in pregnancy   . 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  . Erectile dysfunction   . Fatty liver 2014  . GERD (gastroesophageal reflux disease)   . Hearing aid worn    B/L  . Hypertension   . Hypogonadism male   . Hypovitaminosis D   . 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 NICM (nonischemic cardiomyopathy) ([place])   . Sleep apnea    cpap  . Type II diabetes mellitus ([place])   . Ventricular tachycardia ([place])    observed during stress testing, subsequent VT on EPS  . Wears glasses     Past Surgical History:  Procedure Laterality Date  . ABLATION  06-01-2013   PVC's ablated along basal inferoseptal RV by Dr Rayann Heman  . APPENDECTOMY  1969  . CARDIAC CATHETERIZATION  04/2010  . CARDIAC DEFIBRILLATOR PLACEMENT  05/09/2010   MDT ICD implanted in [place] by Dr Ky Barban  . CATARACT EXTRACTION W/ INTRAOCULAR LENS  IMPLANT, BILATERAL Bilateral   . ICD LEAD REMOVAL N/A 03/19/2019   Procedure: ICD LEAD AND GENERATOR REMOVAL;  Surgeon: Evans Lance, MD;  Location: [facility];  Service: Cardiovascular;  Laterality: N/A;  . KNEE ARTHROSCOPY Left 1984  . LUMBAR LAMINECTOMY  ~ 2004  . TONSILLECTOMY AND ADENOIDECTOMY  1972  .  TOTAL KNEE ARTHROPLASTY Left 04/28/2017  . TOTAL KNEE ARTHROPLASTY Left 04/28/2017   Procedure: TOTAL KNEE ARTHROPLASTY LEFT;  Surgeon: Dorna Leitz, MD;  Location: [facility];  Service: Orthopedics;  Laterality: Left;  . TOTAL KNEE ARTHROPLASTY Right 05/07/2019   Procedure: TOTAL KNEE ARTHROPLASTY;  Surgeon: Dorna Leitz, MD;  Location: [facility];  Service: Orthopedics;  Laterality: Right;  . V-TACH ABLATION N/A 06/01/2013   Procedure: V-TACH ABLATION;  Surgeon: Coralyn Mark, MD;  Location: [facility];  Service: Cardiovascular;  Laterality: N/A;  . VASECTOMY    . VENTRICULAR ABLATION  SURGERY  06/01/2013    Outpatient Medications Prior to Visit  Medication Sig Dispense Refill  . acetaminophen (TYLENOL) 500 MG tablet Take 1,000 mg by mouth every 6 (six) hours as needed for mild pain, moderate pain or headache.     Marland Kitchen apixaban (ELIQUIS) 5 MG TABS tablet Take 1 tablet (5 mg total) by mouth 2 (two) times daily. 180 tablet 3  . Ascorbic Acid (VITAMIN C) 1000 MG tablet Take 1,000 mg by mouth 2 (two) times daily.    . Blood Glucose Monitoring Suppl (ONETOUCH VERIO) w/Device KIT Use to check blood sugar 3 times a day. 1 kit 0  . carvedilol (COREG) 6.25 MG tablet Take 6.25 mg by mouth daily.    . Cholecalciferol (VITAMIN D3) 50 MCG (2000 UT) TABS Take 4,000 Units by mouth 2 (two) times daily.    . Dulaglutide (TRULICITY) 3 GT/3.6IW SOPN Inject 3 mg into the skin once a week. 6 mL 3  . empagliflozin (JARDIANCE) 10 MG TABS tablet Take 1 tablet (10 mg total) by mouth daily. 90 tablet 3  . glucose blood (ONETOUCH VERIO) test strip Use to check blood sugar 3 times a day. 200 each 12  . insulin glargine (LANTUS SOLOSTAR) 100 UNIT/ML Solostar Pen Inject 24-28 Units into the skin at bedtime. 45 mL 3  . Insulin Pen Needle (UNIFINE PENTIPS) 32G X 4 MM MISC USE TWICE DAILY WITH LANTUS AND VICTOZA 100 each 11  . Lancets MISC Glucose monitor to check blood sugars tid dx code E11.9 brand per insurance coverage 300 each 1  . losartan (COZAAR) 25 MG tablet Take 1 tablet (25 mg total) by mouth daily. 90 tablet 3  . metFORMIN (GLUCOPHAGE-XR) 500 MG 24 hr tablet TAKE 4 TABLETS BY MOUTH DAILY WITH BREAKFAST 360 tablet 1  . Quercetin 250 MG TABS Take 500 mg by mouth daily.    . rosuvastatin (CRESTOR) 10 MG tablet Take 1 tablet (10 mg total) by mouth daily. 90 tablet 3  . spironolactone (ALDACTONE) 25 MG tablet Take 1 tablet (25 mg total) by mouth daily. 90 tablet 3  . tamsulosin (FLOMAX) 0.4 MG CAPS capsule Take 0.4 mg by mouth at bedtime.     Marland Kitchen zinc gluconate 50 MG tablet Take 50 mg by mouth 2 (two)  times daily.    . Acetylcysteine 600 MG CAPS Take 600 mg by mouth 2 (two) times daily.    . carvedilol (COREG) 6.25 MG tablet TAKE 1&amp;1/2 TABLETS BY MOUTH TWO TIMES DAILY 270 tablet 3  . docusate sodium (COLACE) 100 MG capsule Take 1 capsule (100 mg total) by mouth 2 (two) times daily. 30 capsule 0   No facility-administered medications prior to visit.    Allergies  Allergen Reactions  . Lisinopril Shortness Of Breath, Swelling and Other (See Comments)    Angioedema     Family History  Problem Relation Age of Onset  . Alcohol abuse  Mother   . COPD Mother   . Depression Mother   . Heart disease Mother   . Hypertension Mother   . Stroke Mother   . COPD Father   . Heart disease Father   . Hypertension Father   . Stroke Father   . Cancer Father   . Diabetes Sister   . Mental retardation Sister   . Learning disabilities Sister   . Hypertension Sister   . Alcohol abuse Brother   . Drug abuse Brother   . Heart disease Brother   . Hypertension Brother   . Coronary artery disease Brother   . Colon cancer Neg Hx   . Pancreatic cancer Neg Hx   . Esophageal cancer Neg Hx   . Stomach cancer Neg Hx   . Liver disease Neg Hx     Social History   Tobacco Use  . Smoking status: Never Smoker  . Smokeless tobacco: Never Used  Vaping Use  . Vaping Use: Never used  Substance Use Topics  . Alcohol use: Yes    Comment: social  . Drug use: No    ROS: As per history of present illness, otherwise negative  BP 110/74   Pulse 95   Ht _0  (1.803 m)   Wt 194 lb 12.8 oz (88.4 kg)   SpO2 99%   BMI 27.17 kg/m  Constitutional: Well-developed and well-nourished. No distress. HEENT: Normocephalic and atraumatic. No scleral icterus. Cardiovascular: Normal rate, regular rhythm and intact distal pulses. No M/R/G Pulmonary/chest: Effort normal and breath sounds normal. No wheezing, rales or rhonchi. Abdominal: Soft, nontender, nondistended. Bowel sounds active throughout. There are  no masses palpable. No hepatosplenomegaly. Extremities: no clubbing, cyanosis, or edema Neurological: Alert and oriented to person place and time. Skin: Skin is warm and dry.  Psychiatric: Normal mood and affect. Behavior is normal.  RELEVANT LABS AND IMAGING: CBC    Component Value Date/Time   WBC 9.6 07/29/2019 1431   RBC 4.53 07/29/2019 1431   HGB 14.6 07/29/2019 1445   HGB 13.3 03/16/2019 0944   HCT 43.0 07/29/2019 1445   HCT 41.3 03/16/2019 0944   PLT 219 07/29/2019 1431   PLT 229 03/16/2019 0944   MCV 95.4 07/29/2019 1431   MCV 91 03/16/2019 0944   MCV 94 09/15/2011 0626   MCH 30.2 07/29/2019 1431   MCHC 31.7 07/29/2019 1431   RDW 13.6 07/29/2019 1431   RDW 13.1 03/16/2019 0944   RDW 14.5 09/15/2011 0626   LYMPHSABS 2.4 07/29/2019 1431   LYMPHSABS 1.2 03/16/2019 0944   LYMPHSABS 1.0 09/15/2011 0626   MONOABS 0.6 07/29/2019 1431   MONOABS 0.2 09/15/2011 0626   EOSABS 0.6 (H) 07/29/2019 1431   EOSABS 0.4 03/16/2019 0944   EOSABS 0.4 09/15/2011 0626   BASOSABS 0.1 07/29/2019 1431   BASOSABS 0.1 03/16/2019 0944   BASOSABS 0.0 09/15/2011 0626    CMP     Component Value Date/Time   NA 135 07/29/2019 1445   NA 137 03/16/2019 0944   NA 142 09/15/2011 0626   K 4.1 07/29/2019 1445   K 3.2 (L) 09/15/2011 0626   CL 98 07/29/2019 1445   CL 107 09/15/2011 0626   CO2 25 07/29/2019 1431   CO2 24 09/15/2011 0626   GLUCOSE 120 (H) 07/29/2019 1445   GLUCOSE 231 (H) 09/15/2011 0626   BUN 28 (H) 07/29/2019 1445   BUN 23 03/16/2019 0944   BUN 25 (H) 09/15/2011 0626   CREATININE 1.50 (H) 07/29/2019 1445  CREATININE 1.21 01/20/2016 0932   CALCIUM 8.4 (L) 07/29/2019 1431   CALCIUM 6.6 (LL) 09/15/2011 0626   PROT 6.5 07/29/2019 1431   PROT 5.2 (L) 09/15/2011 0626   ALBUMIN 3.9 07/29/2019 1431   ALBUMIN 2.7 (L) 09/15/2011 0626   AST 21 07/29/2019 1431   AST 13 (L) 09/15/2011 0626   ALT 20 07/29/2019 1431   ALT 15 09/15/2011 0626   ALKPHOS 71 07/29/2019 1431   ALKPHOS  74 09/15/2011 0626   BILITOT 0.5 07/29/2019 1431   BILITOT 0.4 09/15/2011 0626   GFRNONAA 49 (L) 07/29/2019 1431   GFRNONAA 61 01/20/2016 0932   GFRAA 57 (L) 07/29/2019 1431   GFRAA 71 01/20/2016 0932    ASSESSMENT/PLAN:  75 year old male with a history of GERD, fatty liver, cardiomyopathy and CHF status post ICD placement and subsequent removal (EF normal at [facility] in 2021), CAD diabetes, hypertension, sleep apnea, CKD who is seen to evaluate abdominal bloating, diarrhea with fecal urgency and flatulence symptom.   1.  Diarrhea/abdominal bloating and flatulence symptom --differential includes SIBO, infectious etiology such as Giardia, microscopic colitis.  I recommended that we evaluate further as follows: --GI pathogen panel and fecal leukocytes --If GI pathogen panel negative then proceed to colonoscopy with plan for biopsies to exclude microscopic colitis.  We discussed the risk, benefits and alternatives to colonoscopy and he is agreeable and wishes to proceed --Will hold Eliquis 2 days prior to endoscopic procedures - will instruct when and how to resume after procedure. Benefits and risks of procedure explained including risks of bleeding, perforation, infection, missed lesions, reactions to medications and possible need for hospitalization and surgery for complications. Additional rare but real risk of stroke or other vascular clotting events off Eliquis also explained and need to seek urgent help if any signs of these problems occur. Will communicate by phone or EMR with patient's  prescribing provider to confirm that holding Eliquis is reasonable in this case.  --Lomotil 1 to 2 tablets 4 times daily as needed  2. CRC screening --remote colon polyps.  He had a Cologuard 5 years ago.  Colonoscopy as above will suffice for screening/surveillance.

## 2020-08-14 NOTE — Telephone Encounter (Signed)
Will route to PharmD for rec's re: holding anticoagulation. Richardson Dopp, PA-C    08/14/2020 3:36 PM

## 2020-08-15 ENCOUNTER — Other Ambulatory Visit: Payer: Medicare Other

## 2020-08-15 DIAGNOSIS — R197 Diarrhea, unspecified: Secondary | ICD-10-CM

## 2020-08-15 DIAGNOSIS — R143 Flatulence: Secondary | ICD-10-CM | POA: Diagnosis not present

## 2020-08-15 DIAGNOSIS — R109 Unspecified abdominal pain: Secondary | ICD-10-CM

## 2020-08-15 DIAGNOSIS — R140 Abdominal distension (gaseous): Secondary | ICD-10-CM | POA: Diagnosis not present

## 2020-08-15 DIAGNOSIS — D093 Carcinoma in situ of thyroid and other endocrine glands: Secondary | ICD-10-CM | POA: Diagnosis not present

## 2020-08-15 NOTE — Telephone Encounter (Signed)
He can hold Eliquis for 2 days.

## 2020-08-16 NOTE — Telephone Encounter (Signed)
   Primary Cardiologist: Loralie Champagne, MD  Chart reviewed as part of pre-operative protocol coverage. Given past medical history and time since last visit, based on ACC/AHA guidelines, Paul [name] would be at acceptable risk for the planned procedure without further cardiovascular testing.   Per pharmacy: Patient with diagnosis of atrial fibrillation on Eliquis for anticoagulation.    Procedure: colonoscopy Date of procedure: 08/24/2020   CHA2DS2-VASc Score = 6  This indicates a 9.7% annual risk of stroke. The patient's score is based upon: CHF History: Yes HTN History: Yes Diabetes History: Yes Stroke History: Yes Vascular Disease History: No Age Score: 1 Gender Score: 0   CrCl 55.7 Platelet count 219  Because of this history will confirm with MD if okay to hold Eliquis x 2 days. Per Dr. Aundra Dubin, patient may hold Eliquis for two days prior to procedure.  I will route this recommendation to the requesting party via Epic fax function and remove from pre-op pool.  Please call with questions.  Kathyrn Drown, NP 08/16/2020, 7:46 AM

## 2020-08-18 LAB — GI PROFILE, STOOL, PCR

## 2020-08-21 LAB — FECAL LEUKOCYTES
Micro Number: 11592318
Specimen Quality: ADEQUATE

## 2020-08-21 NOTE — Telephone Encounter (Signed)
Pt has been informed okay to hold Eliquis x2 days prior to procedure. Pt voiced understanding.  ?

## 2020-08-24 ENCOUNTER — Ambulatory Visit (AMBULATORY_SURGERY_CENTER): Payer: Medicare Other | Admitting: Internal Medicine

## 2020-08-24 ENCOUNTER — Encounter: Payer: Self-pay | Admitting: Internal Medicine

## 2020-08-24 ENCOUNTER — Other Ambulatory Visit: Payer: Self-pay

## 2020-08-24 VITALS — BP 123/72 | HR 68 | Temp 97.8°F | Resp 12 | Ht 71.0 in | Wt 194.0 lb

## 2020-08-24 DIAGNOSIS — D122 Benign neoplasm of ascending colon: Secondary | ICD-10-CM | POA: Diagnosis not present

## 2020-08-24 DIAGNOSIS — R197 Diarrhea, unspecified: Secondary | ICD-10-CM | POA: Diagnosis not present

## 2020-08-24 DIAGNOSIS — D120 Benign neoplasm of cecum: Secondary | ICD-10-CM

## 2020-08-24 DIAGNOSIS — N183 Chronic kidney disease, stage 3 unspecified: Secondary | ICD-10-CM | POA: Diagnosis not present

## 2020-08-24 DIAGNOSIS — D124 Benign neoplasm of descending colon: Secondary | ICD-10-CM | POA: Diagnosis not present

## 2020-08-24 DIAGNOSIS — I10 Essential (primary) hypertension: Secondary | ICD-10-CM | POA: Diagnosis not present

## 2020-08-24 DIAGNOSIS — R143 Flatulence: Secondary | ICD-10-CM | POA: Diagnosis not present

## 2020-08-24 DIAGNOSIS — D123 Benign neoplasm of transverse colon: Secondary | ICD-10-CM | POA: Diagnosis not present

## 2020-08-24 DIAGNOSIS — I2510 Atherosclerotic heart disease of native coronary artery without angina pectoris: Secondary | ICD-10-CM | POA: Diagnosis not present

## 2020-08-24 DIAGNOSIS — R140 Abdominal distension (gaseous): Secondary | ICD-10-CM | POA: Diagnosis not present

## 2020-08-24 DIAGNOSIS — E119 Type 2 diabetes mellitus without complications: Secondary | ICD-10-CM | POA: Diagnosis not present

## 2020-08-24 DIAGNOSIS — G4733 Obstructive sleep apnea (adult) (pediatric): Secondary | ICD-10-CM | POA: Diagnosis not present

## 2020-08-24 MED ORDER — SODIUM CHLORIDE 0.9 % IV SOLN: 500.0000 mL | Freq: Once | INTRAVENOUS | Status: DC

## 2020-08-24 NOTE — Progress Notes (Signed)
Report to PACU, RN, vss, BBS= Clear.  

## 2020-08-24 NOTE — Op Note (Signed)
[facility] Patient Name: Paul [name] Procedure Date: 08/24/2020 1:18 PM MRN: [id number] Endoscopist: Jerene Bears , MD Age: 75 Referring MD:  Date of Birth: 11-22-1945 Gender: Male Account #: [id number] Procedure:                Colonoscopy Indications:              Clinically significant diarrhea of unexplained                            origin Medicines:                Monitored Anesthesia Care Procedure:                Pre-Anesthesia Assessment:                           - Prior to the procedure, a History and Physical                            was performed, and patient medications and                            allergies were reviewed. The patient's tolerance of                            previous anesthesia was also reviewed. The risks                            and benefits of the procedure and the sedation                            options and risks were discussed with the patient.                            All questions were answered, and informed consent                            was obtained. Prior Anticoagulants: The patient has                            taken Eliquis (apixaban), last dose was 2 days                            prior to procedure. ASA Grade Assessment: III - A                            patient with severe systemic disease. After                            reviewing the risks and benefits, the patient was                            deemed in satisfactory condition to undergo the  procedure.                           After obtaining informed consent, the colonoscope                            was passed under direct vision. Throughout the                            procedure, the patient's blood pressure, pulse, and                            oxygen saturations were monitored continuously. The                            Olympus CF-HQ190 [id number]) Colonoscope was                            introduced through the  anus and advanced to the                            cecum, identified by appendiceal orifice and                            ileocecal valve. The colonoscopy was performed                            without difficulty. The patient tolerated the                            procedure well. The quality of the bowel                            preparation was good. The ileocecal valve,                            appendiceal orifice, and rectum were photographed. Scope In: 1:20:28 PM Scope Out: 1:47:21 PM Scope Withdrawal Time: 0 hours 23 minutes 21 seconds  Total Procedure Duration: 0 hours 26 minutes 53 seconds  Findings:                 The digital rectal exam was normal.                           Three sessile polyps were found in the cecum. The                            polyps were 5 to 8 mm in size. These polyps were                            removed with a cold snare. Resection and retrieval                            were complete.  Two sessile polyps were found in the ascending                            colon. The polyps were 6 to 12 mm in size. These                            polyps were removed with a cold snare. Resection                            and retrieval were complete.                           A 5 mm polyp was found in the transverse colon. The                            polyp was sessile. The polyp was removed with a                            cold snare. Resection and retrieval were complete.                           Two sessile polyps were found in the descending                            colon. The polyps were 4 to 5 mm in size. These                            polyps were removed with a cold snare. Resection                            and retrieval were complete.                           Multiple small and large-mouthed diverticula were                            found in the sigmoid colon, descending colon and                             ascending colon.                           Internal hemorrhoids were found during                            retroflexion. The hemorrhoids were medium-sized.                           The exam was otherwise normal throughout the                            examined colon.  Biopsies for histology were taken with a cold                            forceps from the right colon and left colon for                            evaluation of microscopic colitis. Complications:            No immediate complications. Estimated Blood Loss:     Estimated blood loss was minimal. Impression:               - Three 5 to 8 mm polyps in the cecum, removed with                            a cold snare. Resected and retrieved.                           - Two 6 to 12 mm polyps in the ascending colon,                            removed with a cold snare. Resected and retrieved.                           - One 5 mm polyp in the transverse colon, removed                            with a cold snare. Resected and retrieved.                           - Two 4 to 5 mm polyps in the descending colon,                            removed with a cold snare. Resected and retrieved.                           - Diverticulosis in the sigmoid colon, in the                            descending colon and in the ascending colon.                           - Internal hemorrhoids.                           - Biopsies were taken with a cold forceps from the                            right colon and left colon for evaluation of                            microscopic colitis. Recommendation:           - Patient has a contact number available for  emergencies. The signs and symptoms of potential                            delayed complications were discussed with the                            patient. Return to normal activities tomorrow.                            Written discharge  instructions were provided to the                            patient.                           - Resume previous diet.                           - Continue present medications.                           - Await pathology results.                           - Repeat colonoscopy is recommended for                            surveillance. The colonoscopy date will be                            determined after pathology results from today's                            exam become available for review. Jerene Bears, MD 08/24/2020 1:54:45 PM This report has been signed electronically.

## 2020-08-24 NOTE — Progress Notes (Signed)
VS taken by C.W. 

## 2020-08-24 NOTE — Progress Notes (Signed)
Called to room to assist during endoscopic procedure.  Patient ID and intended procedure confirmed with present staff. Received instructions for my participation in the procedure from the performing physician.  

## 2020-08-24 NOTE — Patient Instructions (Signed)
YOU HAD AN ENDOSCOPIC PROCEDURE TODAY AT THE [facility]:   Refer to the procedure report that was given to you for any specific questions about what was found during the examination.  If the procedure report does not answer your questions, please call your gastroenterologist to clarify.  If you requested that your care partner not be given the details of your procedure findings, then the procedure report has been included in a sealed envelope for you to review at your convenience later.  YOU SHOULD EXPECT: Some feelings of bloating in the abdomen. Passage of more gas than usual.  Walking can help get rid of the air that was put into your GI tract during the procedure and reduce the bloating. If you had a lower endoscopy (such as a colonoscopy or flexible sigmoidoscopy) you may notice spotting of blood in your stool or on the toilet paper. If you underwent a bowel prep for your procedure, you may not have a normal bowel movement for a few days.  Please Note:  You might notice some irritation and congestion in your nose or some drainage.  This is from the oxygen used during your procedure.  There is no need for concern and it should clear up in a day or so.  SYMPTOMS TO REPORT IMMEDIATELY:   Following lower endoscopy (colonoscopy or flexible sigmoidoscopy):  Excessive amounts of blood in the stool  Significant tenderness or worsening of abdominal pains  Swelling of the abdomen that is new, acute  Fever of 100F or higher   Following upper endoscopy (EGD)  Vomiting of blood or coffee ground material  New chest pain or pain under the shoulder blades  Painful or persistently difficult swallowing  New shortness of breath  Fever of 100F or higher  Black, tarry-looking stools  For urgent or emergent issues, a gastroenterologist can be reached at any hour by calling [phone]. Do not use MyChart messaging for urgent concerns.    DIET:  We do recommend a small meal at first, but  then you may proceed to your regular diet.  Drink plenty of fluids but you should avoid alcoholic beverages for 24 hours.  ACTIVITY:  You should plan to take it easy for the rest of today and you should NOT DRIVE or use heavy machinery until tomorrow (because of the sedation medicines used during the test).    FOLLOW UP: Our staff will call the number listed on your records 48-72 hours following your procedure to check on you and address any questions or concerns that you may have regarding the information given to you following your procedure. If we do not reach you, we will leave a message.  We will attempt to reach you two times.  During this call, we will ask if you have developed any symptoms of COVID 19. If you develop any symptoms (ie: fever, flu-like symptoms, shortness of breath, cough etc.) before then, please call [phone].  If you test positive for Covid 19 in the 2 weeks post procedure, please call and report this information to us.    If any biopsies were taken you will be contacted by phone or by letter within the next 1-3 weeks.  Please call us at [phone] if you have not heard about the biopsies in 3 weeks.    SIGNATURES/CONFIDENTIALITY: You and/or your care partner have signed paperwork which will be entered into your electronic medical record.  These signatures attest to the fact that that the information above on   your After Visit Summary has been reviewed and is understood.  Full responsibility of the confidentiality of this discharge information lies with you and/or your care-partner. 

## 2020-08-29 ENCOUNTER — Telehealth: Payer: Self-pay | Admitting: *Deleted

## 2020-08-29 NOTE — Telephone Encounter (Signed)
No answer for post procedure call back. Unable to leave message. 

## 2020-08-29 NOTE — Telephone Encounter (Signed)
Attempted f/u phone call. No answer. Left message. °

## 2020-09-06 ENCOUNTER — Encounter: Payer: Self-pay | Admitting: Internal Medicine

## 2020-09-07 ENCOUNTER — Telehealth: Payer: Self-pay

## 2020-09-07 NOTE — Telephone Encounter (Deleted)
-----   Message from Deboraha Sprang, MD sent at 09/06/2020  5:59 PM EDT ----- Paul [name]  with prior TIA, had a few hours of AFib,  [name] would like him on anticoagulation   you will be seeing him 4/4   Rosann Auerbach could you please let him know that the Afib was present and that anticoagulation will be discussed with him when he sees DM

## 2020-09-07 NOTE — Telephone Encounter (Signed)
Error - this note was inadvertently copied from a previous result note.  Pt is currently prescribed Eliquis 5mg  - 1 tablet by mouth twice daily.

## 2020-09-11 NOTE — Progress Notes (Signed)
Patient ID: [name], male   DOB: [birth year], 75 y.o.   MRN: [id number]  This visit occurred during the SARS-CoV-2 public health emergency.  Safety protocols were in place, including screening questions prior to the visit, additional usage of staff PPE, and extensive cleaning of exam room while observing appropriate contact time as indicated for disinfecting solutions.    HPI: Paul [name] is a 75 y.o.-year-old male, returning for f/u for DM2, dx in 2001, uncontrolled, with complications (non-iCMP - CHF, cerebro-vascular ds - s/p TIA, CKD, mild PN, ED, DR).  Last visit 6 months ago.  Interim history: He developed Covid19 in 05/2020. He had the Ab infusion. He recovered well. Lost another 10 lbs since last OV.  He continues to cut down portions. His sugars remain well controlled except when he eats out for dinner >> Sugars in the morning may be slightly higher. He recently fell in the woods when walking with his dog >> bruise on right side of the face.  DM2: Reviewed HbA1c levels: Lab Results  Component Value Date   HGBA1C 6.9 (A) 03/15/2020   HGBA1C 7.4 (A) 09/09/2019   HGBA1C 7.0 (H) 05/07/2019  09/23/2014: 8.8%  Pt is on a regimen of: - Metformin XR 2000 mg in a.m.  stopped due to increased urination and dehydration >> Jardiance 10 mg in a.m. (started 08/2018) - Victoza 1.8  mg daily in am >> Trulicity 1.5 >> 3 mg - Lantus 38 >> 40 >> 36-38 >> 42 >> 22-24 >> 24 units at bedtime  He stopped Amaryl before our visit in 02/2019.  Pt checks his sugars 3 times a day: - am: ave 105; 56, 76-176 >> 79-177 >> 73, 74 ,90-137, 156, 165 >> 69, 73-118, 149 (ate out) - 2h after b'fast: n/c  - before lunch: n/c >> 221 (overcorrection of 56) >> n/c - 2h after lunch: n/c - before dinner: 79-134 >> n/c >> 110-190, 265 >> 120-130 >> n/c - 2h after dinner: n/c - bedtime:  123-182, 228 >> 117-148, 229 >> [place]  - nighttime: n/c >> 65, 70s 2x a week >> n/c Lowest sugar was 56 >> 79 >> 73  >> 69; he has hypoglycemia awareness in the 70s. Highest sugar was 265 >> 165 >> 149  Glucometer: OneTouch Ultra  Pt's meals are: - Breakfast: cereals + blueberries - Lunch: chinese food or sandwich, fruit - Dinner: meat + veggies or salad - Snacks: no He used Nutrisystem 2x before >> lost 40 lbs.  At last visit, he was on a low-carb diet along with his wife. He stopped the diet afterwards, but at last visit he was only eating one apple for lunch daily.  -+ CKD: Lab Results  Component Value Date   BUN 28 (H) 07/29/2019   CREATININE 1.50 (H) 07/29/2019  07/23/2018: 32/1.57, GFR 44, glucose 142 02/19/2017: 19/1.37, GFR 52, glucose 143 09/23/2014: 13/1.1, GFR >60 02/19/2017: ACR 247 No results found for: MICRALBCREAT  He had angioedema with ACE inhibitors.  Paul [name] was not covered.  On losartan 25.  -+ HL; last set of lipids: Lab Results  Component Value Date   CHOL 91 07/03/2019   HDL 34 (L) 07/03/2019   LDLCALC 40 07/03/2019   TRIG 87 07/03/2019   CHOLHDL 2.7 07/03/2019  07/23/2018: 117/135/32/63 02/19/2017: 149/218/30/87  09/2014: 158/157/25/100 On Crestor 10.  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  Dr. Midge Aver and Dr. Zigmund Daniel.  -+ Numbness and tingling in his left foot  H/o hypogonadism He was previously on AndroGel but this stopped being covered >> he had to stop the medication with testosterone levels were normal afterwards: 07/23/2018: Free testosterone 32.7 (6-73) Component     Latest Ref Rng & Units 01/20/2016  Testosterone,Total,LC/MS/MS     250 - 1,100 ng/dL 336  Testosterone, Free     35.0 - 155.0 pg/mL 66.2  Sex Hormone Binding Glob.     22 - 77 nmol/L 27   PSA levels were normal: Lab Results  Component Value Date   PSA 0.9 01/20/2016   PSA 1.50 07/06/2015   PSA 1.16 10/16/2012   Patient has a history of trigeminy, status post endocardial ablation at [facility]. He had an epicardial ablation in 08/2014. During that time, he had 2 stents  latest, and the ablation procedure was postponed. He is followed by Dr. Dwana Curd.  He has OSA and is on a CPAP.  He has a history of DVT in the left leg, which is more edematous. He continues on Coreg, Lasix, spironolactone.  He is seen in the CHF clinic. He had 2x TIA 06/2019, 6 weeks post his R TKR. He had an event monitor - no arrhythmia. On Xarelto.  ROS: Constitutional: no weight gain/+ weight loss, no fatigue, no subjective hyperthermia, no subjective hypothermia Eyes: no blurry vision, no xerophthalmia ENT: no sore throat, no nodules palpated in neck, no dysphagia, no odynophagia, no hoarseness Cardiovascular: no CP/no SOB/no palpitations/no leg swelling Respiratory: no cough/no SOB/no wheezing Gastrointestinal: no N/no V/no D/no C/no acid reflux Musculoskeletal: no muscle aches/no joint aches, + occasional nighttime cramps in toes Skin: no rashes, no hair loss Neurological: no tremors/+ numbness/+ tingling/no dizziness  I reviewed pt's medications, allergies, PMH, social hx, family hx, and changes were documented in the history of present illness. Otherwise, unchanged from my initial visit note.  Past Medical History:  Diagnosis Date  . Aftercare following removal/replacement defibrillator    a. defibrillator removed 03/2019.  Marland Kitchen Arthritis    "thumbs, neck, knees" (06/01/2013)  . Cardiomyopathy, dilated, nonischemic ([place]) 10/20/2012  . Cataract   . Chronic systolic heart failure ([place])   . CKD (chronic kidney disease), stage III ([place])   . Coronary artery disease    stent placed  . DVT (deep vein thrombosis) in pregnancy   . 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  . Erectile dysfunction   . Fatty liver 2014  . GERD (gastroesophageal reflux disease)   . Hearing aid worn    B/L  . Hypertension   . Hypogonadism male   . Hypovitaminosis D   . 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 NICM (nonischemic cardiomyopathy)  ([place])   . Sleep apnea    cpap  . Type II diabetes mellitus ([place])   . Ventricular tachycardia ([place])    observed during stress testing, subsequent VT on EPS  . Wears glasses    Past Surgical History:  Procedure Laterality Date  . ABLATION  06-01-2013   PVC's ablated along basal inferoseptal RV by Dr Rayann Heman  . APPENDECTOMY  1969  . CARDIAC CATHETERIZATION  04/2010  . CARDIAC DEFIBRILLATOR PLACEMENT  05/09/2010   MDT ICD implanted in [place] by Dr Ky Barban  . CATARACT EXTRACTION W/ INTRAOCULAR LENS  IMPLANT, BILATERAL Bilateral   . ICD LEAD REMOVAL N/A 03/19/2019   Procedure: ICD LEAD AND GENERATOR REMOVAL;  Surgeon: Evans Lance, MD;  Location: [place];  Service: Cardiovascular;  Laterality: N/A;  .  KNEE ARTHROSCOPY Left 1984  . LUMBAR LAMINECTOMY  ~ 2004  . TONSILLECTOMY AND ADENOIDECTOMY  1972  . TOTAL KNEE ARTHROPLASTY Left 04/28/2017  . TOTAL KNEE ARTHROPLASTY Left 04/28/2017   Procedure: TOTAL KNEE ARTHROPLASTY LEFT;  Surgeon: Dorna Leitz, MD;  Location: [place];  Service: Orthopedics;  Laterality: Left;  . TOTAL KNEE ARTHROPLASTY Right 05/07/2019   Procedure: TOTAL KNEE ARTHROPLASTY;  Surgeon: Dorna Leitz, MD;  Location: [facility];  Service: Orthopedics;  Laterality: Right;  . V-TACH ABLATION N/A 06/01/2013   Procedure: V-TACH ABLATION;  Surgeon: Coralyn Mark, MD;  Location[facility];  Service: Cardiovascular;  Laterality: N/A;  . VASECTOMY    . VENTRICULAR ABLATION SURGERY  06/01/2013   History   Social History  . Marital Status: Married    Spouse Name: N/A  . Number of Children: 2   Occupational History  . physician   Social History Main Topics  . Smoking status: Never Smoker   . Smokeless tobacco: Never Used  . Alcohol Use: 1.2 oz/week    1 Glasses scotch, rarely  . 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    Moved here from [place] [facility] 1.5 years ago   Current  Outpatient Medications on File Prior to Visit  Medication Sig Dispense Refill  . acetaminophen (TYLENOL) 500 MG tablet Take 1,000 mg by mouth every 6 (six) hours as needed for mild pain, moderate pain or headache.     Marland Kitchen apixaban (ELIQUIS) 5 MG TABS tablet Take 1 tablet (5 mg total) by mouth 2 (two) times daily. 180 tablet 3  . Ascorbic Acid (VITAMIN C) 1000 MG tablet Take 1,000 mg by mouth 2 (two) times daily.    . Blood Glucose Monitoring Suppl (ONETOUCH VERIO) w/Device KIT Use to check blood sugar 3 times a day. 1 kit 0  . carvedilol (COREG) 6.25 MG tablet Take 6.25 mg by mouth daily.    . Cholecalciferol (VITAMIN D3) 50 MCG (2000 UT) TABS Take 4,000 Units by mouth 2 (two) times daily.    . diphenoxylate-atropine (LOMOTIL) 2.5-0.025 MG tablet Take 2 tablets by mouth 4 (four) times daily as needed for diarrhea or loose stools. 60 tablet 1  . Dulaglutide (TRULICITY) 3 WC/5.8NI SOPN Inject 3 mg into the skin once a week. 6 mL 3  . empagliflozin (JARDIANCE) 10 MG TABS tablet Take 1 tablet (10 mg total) by mouth daily. 90 tablet 3  . glucose blood (ONETOUCH VERIO) test strip Use to check blood sugar 3 times a day. 200 each 12  . insulin glargine (LANTUS SOLOSTAR) 100 UNIT/ML Solostar Pen Inject 24-28 Units into the skin at bedtime. 45 mL 3  . Insulin Pen Needle (UNIFINE PENTIPS) 32G X 4 MM MISC USE TWICE DAILY WITH LANTUS AND VICTOZA 100 each 11  . Lancets MISC Glucose monitor to check blood sugars tid dx code E11.9 brand per insurance coverage 300 each 1  . losartan (COZAAR) 25 MG tablet Take 1 tablet (25 mg total) by mouth daily. 90 tablet 3  . metFORMIN (GLUCOPHAGE-XR) 500 MG 24 hr tablet TAKE 4 TABLETS BY MOUTH DAILY WITH BREAKFAST 360 tablet 1  . rosuvastatin (CRESTOR) 10 MG tablet Take 1 tablet (10 mg total) by mouth daily. 90 tablet 3  . spironolactone (ALDACTONE) 25 MG tablet Take 1 tablet (25 mg total) by mouth daily. 90 tablet 3  . tamsulosin (FLOMAX) 0.4 MG  CAPS capsule Take 0.4 mg by  mouth at bedtime.      No current facility-administered medications on file prior to visit.   Allergies  Allergen Reactions  . Lisinopril Shortness Of Breath, Swelling and Other (See Comments)    Angioedema    Family History  Problem Relation Age of Onset  . Alcohol abuse Mother   . COPD Mother   . Depression Mother   . Heart disease Mother   . Hypertension Mother   . Stroke Mother   . COPD Father   . Heart disease Father   . Hypertension Father   . Stroke Father   . Cancer Father   . Diabetes Sister   . Mental retardation Sister   . Learning disabilities Sister   . Hypertension Sister   . Alcohol abuse Brother   . Drug abuse Brother   . Heart disease Brother   . Hypertension Brother   . Coronary artery disease Brother   . Colon cancer Neg Hx   . Pancreatic cancer Neg Hx   . Esophageal cancer Neg Hx   . Stomach cancer Neg Hx   . Liver disease Neg Hx   . Rectal cancer Neg Hx    PE: BP 120/78 (BP Location: Right Arm, Patient Position: Sitting, Cuff Size: Normal)   Pulse 75   Ht '5\' 11"'  (1.803 m)   Wt 193 lb (87.5 kg)   SpO2 99%   BMI 26.92 kg/m  Body mass index is 26.92 kg/m. Wt Readings from Last 3 Encounters:  09/12/20 193 lb (87.5 kg)  08/24/20 194 lb (88 kg)  08/14/20 194 lb 12.8 oz (88.4 kg)   Constitutional: overweight, in NAD Eyes: PERRLA, EOMI, no exophthalmos ENT: moist mucous membranes, no thyromegaly, no cervical lymphadenopathy Cardiovascular: RRR, No MRG Respiratory: CTA B Gastrointestinal: abdomen soft, NT, ND, BS+ Musculoskeletal: no deformities, strength intact in all 4 Skin: moist, warm, + bruising on the right side of his face (zygoma) Neurological: no tremor with outstretched hands, DTR normal in all 4  ASSESSMENT: 1. DM2, insulin-dependent, uncontrolled, with complications - non-iCMP - CHF - cerebro-vascular ds. - s/p TIA x2 06/2019 - CKD - DR - mild PN - ED  2.  History of hypogonadism  3. HL  PLAN:  1. Patient with  longstanding, previously uncontrolled type 2 diabetes, on Metformin, SGLT2 inhibitor, long-acting insulin and weekly GLP-1 receptor agonist with improved control at last visit after improving his diet and increasing her Trulicity dose.  At that time, sugars were mostly at goal in the morning with 3 exceptions after dietary indiscretions were after missing Trulicity due to problems with [employer].  He was not checking sugars later in the day and I advised him to start doing so.  HbA1c at that time was 6.9%. -He lost almost 10 pounds since last visit (from 202 pounds) -At this visit, sugars are very well controlled and even had some sugars in the 60s in the morning.  He describes an episode when he woke up in the middle of the night, felt hypoglycemic, but did not check sugars, only drank orange juice.  He is not checking sugars later in the day, only in the morning.  He had one higher blood sugar, this morning, at 149, after eating out last night. -At today's visit, we will start decreasing his Lantus - I suggested to:  Patient Instructions  Please continue: - Metformin 2000 mg with breakfast - Jardiance 10 mg before b'fast - Trulicity 3 mg weekly  Please decrease: - Lantus 20 units at bedtime (may decrease further to 16 units if sugars remain controlled)  Please return in 6 months with your sugar log.   - we checked his HbA1c: 6.4% (improved) - advised to check sugars at different times of the day - 1-2x a day, rotating check times - advised for yearly eye exams >> he is UTD - he is due for annual labs that she will return fasting - return to clinic in 6 months    2.  History of hypogonadism -Previously on AndroGel, however, this was not covered for 2 years so he is now off testosterone supplementation -However, latest testosterone level from 07/2018 was normal -No further investigation is needed for now  3. HL -Reviewed latest lipid panel from 06/2019: Tractions at goal with  exception of a slightly low HDL: Lab Results  Component Value Date   CHOL 91 07/03/2019   HDL 34 (L) 07/03/2019   LDLCALC 40 07/03/2019   TRIG 87 07/03/2019   CHOLHDL 2.7 07/03/2019  -He continues on Crestor 10 mg daily without side effects -He is due for another lipid panel -I will order this today and he will return  Orders Placed This Encounter  Procedures  . Microalbumin / creatinine urine ratio  . Comprehensive metabolic panel  . Lipid panel     Philemon Kingdom, MD PhD [facility]

## 2020-09-12 ENCOUNTER — Other Ambulatory Visit (HOSPITAL_COMMUNITY): Payer: Self-pay | Admitting: Internal Medicine

## 2020-09-12 ENCOUNTER — Other Ambulatory Visit: Payer: Self-pay

## 2020-09-12 ENCOUNTER — Ambulatory Visit (INDEPENDENT_AMBULATORY_CARE_PROVIDER_SITE_OTHER): Payer: Medicare Other | Admitting: Internal Medicine

## 2020-09-12 ENCOUNTER — Encounter: Payer: Self-pay | Admitting: Internal Medicine

## 2020-09-12 VITALS — BP 120/78 | HR 75 | Ht 71.0 in | Wt 193.0 lb

## 2020-09-12 DIAGNOSIS — E291 Testicular hypofunction: Secondary | ICD-10-CM | POA: Diagnosis not present

## 2020-09-12 DIAGNOSIS — E785 Hyperlipidemia, unspecified: Secondary | ICD-10-CM | POA: Diagnosis not present

## 2020-09-12 DIAGNOSIS — E1165 Type 2 diabetes mellitus with hyperglycemia: Secondary | ICD-10-CM

## 2020-09-12 DIAGNOSIS — E1142 Type 2 diabetes mellitus with diabetic polyneuropathy: Secondary | ICD-10-CM

## 2020-09-12 LAB — POCT GLYCOSYLATED HEMOGLOBIN (HGB A1C): Hemoglobin A1C: 6.4 % — AB (ref 4.0–5.6)

## 2020-09-12 MED ORDER — ROSUVASTATIN CALCIUM 10 MG PO TABS: 10.0000 mg | ORAL_TABLET | Freq: Every day | ORAL | 3 refills | Status: DC

## 2020-09-12 MED FILL — ROSUVASTATIN CALCIUM 10 MG: 10 | 90 days supply | Qty: 90 | Fill #0

## 2020-09-12 NOTE — Addendum Note (Signed)
Addended by: Lauralyn Primes on: 09/12/2020 09:27 AM   Modules accepted: Orders

## 2020-09-12 NOTE — Patient Instructions (Addendum)
Please continue: - Metformin 2000 mg with breakfast - Jardiance 10 mg before b'fast - Trulicity 3 mg weekly  Please decrease: - Lantus 20 units at bedtime (may decrease further to 16 units if sugars remain controlled)  Please return in 6 months with your sugar log.

## 2020-09-14 MED FILL — TAMSULOSIN HCL 0.4 MG CAP: 0.4 | 30 days supply | Qty: 30 | Fill #6

## 2020-09-18 ENCOUNTER — Encounter (HOSPITAL_COMMUNITY): Payer: Medicare Other | Admitting: Cardiology

## 2020-09-19 ENCOUNTER — Ambulatory Visit (HOSPITAL_COMMUNITY)
Admission: RE | Admit: 2020-09-19 | Discharge: 2020-09-19 | Disposition: A | Discharge status: Home / Self Care | Payer: Medicare Other | Source: Ambulatory Visit | Attending: Cardiology | Admitting: Cardiology

## 2020-09-19 ENCOUNTER — Other Ambulatory Visit (HOSPITAL_COMMUNITY): Payer: Self-pay

## 2020-09-19 ENCOUNTER — Encounter (HOSPITAL_COMMUNITY): Payer: Self-pay | Admitting: Cardiology

## 2020-09-19 ENCOUNTER — Other Ambulatory Visit: Payer: Self-pay

## 2020-09-19 VITALS — BP 126/80 | HR 72 | Wt 194.6 lb

## 2020-09-19 DIAGNOSIS — Z794 Long term (current) use of insulin: Secondary | ICD-10-CM | POA: Insufficient documentation

## 2020-09-19 DIAGNOSIS — Z955 Presence of coronary angioplasty implant and graft: Secondary | ICD-10-CM | POA: Diagnosis not present

## 2020-09-19 DIAGNOSIS — Z8249 Family history of ischemic heart disease and other diseases of the circulatory system: Secondary | ICD-10-CM | POA: Insufficient documentation

## 2020-09-19 DIAGNOSIS — I5042 Chronic combined systolic (congestive) and diastolic (congestive) heart failure: Secondary | ICD-10-CM | POA: Insufficient documentation

## 2020-09-19 DIAGNOSIS — I5022 Chronic systolic (congestive) heart failure: Secondary | ICD-10-CM

## 2020-09-19 DIAGNOSIS — Z79899 Other long term (current) drug therapy: Secondary | ICD-10-CM | POA: Insufficient documentation

## 2020-09-19 DIAGNOSIS — Z7901 Long term (current) use of anticoagulants: Secondary | ICD-10-CM | POA: Diagnosis not present

## 2020-09-19 DIAGNOSIS — E1122 Type 2 diabetes mellitus with diabetic chronic kidney disease: Secondary | ICD-10-CM | POA: Diagnosis not present

## 2020-09-19 DIAGNOSIS — Z95810 Presence of automatic (implantable) cardiac defibrillator: Secondary | ICD-10-CM | POA: Insufficient documentation

## 2020-09-19 DIAGNOSIS — Z9049 Acquired absence of other specified parts of digestive tract: Secondary | ICD-10-CM | POA: Insufficient documentation

## 2020-09-19 DIAGNOSIS — Z888 Allergy status to other drugs, medicaments and biological substances status: Secondary | ICD-10-CM | POA: Diagnosis not present

## 2020-09-19 DIAGNOSIS — N183 Chronic kidney disease, stage 3 unspecified: Secondary | ICD-10-CM | POA: Insufficient documentation

## 2020-09-19 DIAGNOSIS — Z7902 Long term (current) use of antithrombotics/antiplatelets: Secondary | ICD-10-CM | POA: Insufficient documentation

## 2020-09-19 DIAGNOSIS — Z86718 Personal history of other venous thrombosis and embolism: Secondary | ICD-10-CM | POA: Diagnosis not present

## 2020-09-19 DIAGNOSIS — I2510 Atherosclerotic heart disease of native coronary artery without angina pectoris: Secondary | ICD-10-CM | POA: Insufficient documentation

## 2020-09-19 DIAGNOSIS — I480 Paroxysmal atrial fibrillation: Secondary | ICD-10-CM | POA: Insufficient documentation

## 2020-09-19 DIAGNOSIS — I428 Other cardiomyopathies: Secondary | ICD-10-CM | POA: Diagnosis not present

## 2020-09-19 DIAGNOSIS — I130 Hypertensive heart and chronic kidney disease with heart failure and stage 1 through stage 4 chronic kidney disease, or unspecified chronic kidney disease: Secondary | ICD-10-CM | POA: Diagnosis not present

## 2020-09-19 LAB — CBC
HCT: 43.9 % (ref 39.0–52.0)
Hemoglobin: 14.2 g/dL (ref 13.0–17.0)
MCH: 30.5 pg (ref 26.0–34.0)
MCHC: 32.3 g/dL (ref 30.0–36.0)
MCV: 94.4 fL (ref 80.0–100.0)
Platelets: 222 10*3/uL (ref 150–400)
RBC: 4.65 MIL/uL (ref 4.22–5.81)
RDW: 13.5 % (ref 11.5–15.5)
WBC: 8.9 10*3/uL (ref 4.0–10.5)
nRBC: 0 % (ref 0.0–0.2)

## 2020-09-19 LAB — BASIC METABOLIC PANEL
Anion gap: 7 (ref 5–15)
BUN: 21 mg/dL (ref 8–23)
CO2: 30 mmol/L (ref 22–32)
Calcium: 9.1 mg/dL (ref 8.9–10.3)
Chloride: 101 mmol/L (ref 98–111)
Creatinine, Ser: 1.27 mg/dL — ABNORMAL HIGH (ref 0.61–1.24)
GFR, Estimated: 60 mL/min — ABNORMAL LOW (ref >60)
Glucose, Bld: 131 mg/dL — ABNORMAL HIGH (ref 70–99)
Potassium: 4.6 mmol/L (ref 3.5–5.1)
Sodium: 138 mmol/L (ref 135–145)

## 2020-09-19 LAB — LIPID PANEL
Cholesterol: 114 mg/dL (ref 0–200)
HDL: 44 mg/dL (ref >40)
LDL Cholesterol: 55 mg/dL (ref 0–99)
Total CHOL/HDL Ratio: 2.6 RATIO
Triglycerides: 74 mg/dL (ref <150)
VLDL: 15 mg/dL (ref 0–40)

## 2020-09-19 MED ORDER — CARVEDILOL 3.125 MG PO TABS
3.1250 mg | ORAL_TABLET | Freq: Every day | ORAL | 6 refills | Status: DC
  Filled 2020-09-19: qty 60, 60d supply, fill #0
  Filled 2020-12-16: qty 60, 60d supply, fill #1
  Filled 2021-02-09: qty 60, 60d supply, fill #2

## 2020-09-19 NOTE — Patient Instructions (Signed)
Change Carvedilol to 3.125 mg Twice daily   Labs done today, your results will be available in MyChart, we will contact you for abnormal readings.  Your physician recommends that you schedule a follow-up appointment in: 3 months  Your physician has requested that you have an echocardiogram. Echocardiography is a painless test that uses sound waves to create images of your heart. It provides your doctor with information about the size and shape of your heart and how well your heart's chambers and valves are working. This procedure takes approximately one hour. There are no restrictions for this procedure.  Please call our office in September to schedule your follow up appointment  If you have any questions or concerns before your next appointment please send [place] a message through [place] or call our office at [phone].    TO LEAVE A MESSAGE FOR THE NURSE SELECT OPTION 2, PLEASE LEAVE A MESSAGE INCLUDING: . YOUR NAME . DATE OF BIRTH . CALL BACK NUMBER . REASON FOR CALL**this is important as we prioritize the call backs  [place] AS LONG AS YOU CALL BEFORE 4:00 PM  At the [facility], you and your health needs are our priority. As part of our continuing mission to provide you with exceptional heart care, we have created designated Provider Care Teams. These Care Teams include your primary Cardiologist (physician) and Advanced Practice Providers (APPs- Physician Assistants and Nurse Practitioners) who all work together to provide you with the care you need, when you need it.   You may see any of the following providers on your designated Care Team at your next follow up: Marland Kitchen Dr Glori Bickers . Dr Loralie Champagne . Dr Vickki Muff . Darrick Grinder, NP . Lyda Jester, [place] . Audry Riles, PharmD   Please be sure to bring in all your medications bottles to every appointment.

## 2020-09-19 NOTE — Progress Notes (Signed)
Date:  09/19/2020   ID:  Paul [name], DOB 1946/03/01, MRN [id number]  Provider location: [place] [facility] Type of Visit: Established patient   PCP:  Patient, No Pcp Per (Inactive)  Cardiologist:  Dr. Aundra Dubin EP: Dr. Caryl Comes   History of Present Illness: Paul [name] is a 75 y.o. 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 Patient has had a cardiomyopathy known since around 2011.  Cath at that time showed nonobstructive CAD but EF 25%.  He got a Medtronic ICD.  Initial workup was in [place], and he subsequently moved to [place].  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    He has been noted to have frequent PVCs, at one point up to 1/3 of beats.  VT ablation did not help much, but mexiletine seemed to cut back on PVC frequency.  He is no longer taking mexiletine. Dr Ouida Sills felt like his PVCs increased again.  He was seen at [facility] for possible VT ablation.  During that visit, his ICD was interrogated and PVCs were noted to be up to > 1000/hr with short runs NSVT.  Echo was repeated in 1/16 and showed EF down to 30-35%.  Also of note, patient had an untriggered DVT in 12/15 and was on Xarelto for a time but is no longer taking it. He was diagnosed with OSA and is on CPAP.   He had VT mapping at [facility] in 4/16, mapped to inferior septum.  He had LHC the next day, showing 90% distal LAD and 90% PDA.  He had DES to both lesions.  He was sent home on ASA 325, Plavix 75, and Xarelto 20.  Plan was to return 1 year after DES placement to have epicardial VT ablation (off Plavix).    In 6/17, he had unsuccessful epicardial/endocardial VT ablation at [facility]. Suspect mid-myocardial focus.   Echo in 10/18 showed EF 40-45%.  Echo in 3/20 showed EF improved to normal range, 55-60% with normal RV.   Zio patch in 6/20 showed PVCs down to 2.5% of total beats.   Echo in 1/21 showed EF up to 55-60%, normal RV.    In 1/21, he had left-sided weakness with TIA.  Event monitor showed about 2 hrs of atrial fibrillation so anticoagulation was started.   He returns for followup of CHF.  He has no residual neurologic symptoms.  Good stamina, no significant exertional dyspnea.  No chest pain.  No palpitations.  He has been taking Coreg 6.25 mg once daily as he feels like it was causing a fall in his BP and fatigue.  No orthopnea/PND.    Labs (10/15): K 4.7, creatinine 1.6, LDL 84, HDL 35 Labs (12/15): K 4.8, creatinine 1.23 Labs (2/16): K 4.5, creatinine 1.4 Labs (8/17): LDL 103 Labs (9/18): LDL 87, HDL 30, TGs 218, K 4.1, creatinine 1.37, hgb 14 Labs (11/18): hgb 11.5, K 4.8, creatinine 1.48 Labs (2/20): K 5.1, creatinine 1.57, hgb 13.5, LDL 63 Labs (8/20): K 4.9, creatinine 1.38, hgb 13.7 Labs (2/21): K 4.2, creatinine 1.43  ECG (personally reviewed, 3/20): NSR, RBBB, LAFB  PMH: 1. Nonischemic cardiomyopathy: Diagnosed in [place] in 2011.  Possibly related to frequent PVCs. Medtronic ICD placed in 2011.  Echo (6/14) with EF 35%.  Echo (10/15) with EF 40-45% (difficult with PVCs), mildly dilated RV with moderately decreased RV systolic function.  Echo (1/16) with EF 30-35%, mildly dilated LV.   -  Echo (10/18): EF 40-45%, normal RV size and systolic function.  - Echo (3/20): EF 55-60%, normal RV.  - h/o angioedema with ACEI.  - Echo (1/21): EF 55-60%, normal RV.  2. PVCs: Frequent, at one point up to 1/3 of total beats.  Patient had endocardial ablation in 6/14 that was not successful.  However, PVC count dropped with initiation of mexiletine.  He is now off mexiletine.  - Combined epicardial/endocardial ablation 6/17 at [facility] was unsuccessful, likely due to mid-myocardial focus.  - Holter (7/19): 15% PVCs.  - Zio patch (6/20): 2.5% PVCs, 5.5% PACs, no atrial fibrillation.  3. HTN 4. Type II diabetes 5. CAD:  LHC (1/11) with 40-50% mLAD stenosis, 90% small OM1 stenosis, EF 25%.  LHC (4/16) witih 90%  dLAD and 90% PDA, both treated with DES.  6. GERD 7. Appendectomy 8. Lumbar laminectomy 9. CKD 10. Hyperlipidemia: Myalgias with Crestor.  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  12. OSA:  CPAP 13. CKD: Stage 3.  14. TIA 1/21 15. Atrial fibrillation: Paroxysmal.   Current Outpatient Medications  Medication Sig Dispense Refill  . acetaminophen (TYLENOL) 500 MG tablet Take 1,000 mg by mouth every 6 (six) hours as needed for mild pain, moderate pain or headache.     Marland Kitchen apixaban (ELIQUIS) 5 MG TABS tablet Take 1 tablet (5 mg total) by mouth 2 (two) times daily. 180 tablet 3  . Blood Glucose Monitoring Suppl (ONETOUCH VERIO) w/Device KIT Use to check blood sugar 3 times a day. 1 kit 0  . Dulaglutide (TRULICITY) 3 AV/4.0JW SOPN Inject 3 mg into the skin once a week. 6 mL 3  . empagliflozin (JARDIANCE) 10 MG TABS tablet Take 1 tablet (10 mg total) by mouth daily. 90 tablet 3  . glucose blood (ONETOUCH VERIO) test strip Use to check blood sugar 3 times a day. 200 each 12  . insulin glargine (LANTUS SOLOSTAR) 100 UNIT/ML Solostar Pen Inject 24-28 Units into the skin at bedtime. 45 mL 3  . Insulin Pen Needle (UNIFINE PENTIPS) 32G X 4 MM MISC USE TWICE DAILY WITH LANTUS AND VICTOZA 100 each 11  . Lancets MISC Glucose monitor to check blood sugars tid dx code E11.9 brand per insurance coverage 300 each 1  . losartan (COZAAR) 25 MG tablet Take 1 tablet (25 mg total) by mouth daily. 90 tablet 3  . metFORMIN (GLUCOPHAGE-XR) 500 MG 24 hr tablet TAKE 4 TABLETS BY MOUTH DAILY WITH BREAKFAST 360 tablet 1  . rosuvastatin (CRESTOR) 10 MG tablet Take 1 tablet (10 mg total) by mouth daily. 90 tablet 3  . spironolactone (ALDACTONE) 25 MG tablet Take 1 tablet (25 mg total) by mouth daily. 90 tablet 3  . tamsulosin (FLOMAX) 0.4 MG CAPS capsule Take 0.4 mg by mouth at bedtime.     . carvedilol (COREG) 3.125 MG tablet Take 1 tablet (3.125 mg total) by mouth daily. 60 tablet 6   No current facility-administered medications  for this encounter.    Allergies:   Lisinopril   Social History:  The patient  reports that he has never smoked. He has never used smokeless tobacco. He reports current alcohol use. He reports that he does not use drugs.   Family History:  The patient's family history includes Alcohol abuse in his brother and mother; COPD in his father and mother; Cancer in his father; Coronary artery disease in his brother; Depression in his mother; Diabetes in his sister; Drug abuse in his brother; Heart disease in his brother, father, and  mother; Hypertension in his brother, father, mother, and sister; Learning disabilities in his sister; Mental retardation in his sister; Stroke in his father and mother.   ROS:  Please see the history of present illness.   All other systems are personally reviewed and negative.   Exam:   BP 126/80   Pulse 72   Wt 88.3 kg (194 lb 9.6 oz)   SpO2 100%   BMI 27.14 kg/m  General: NAD Neck: No JVD, no thyromegaly or thyroid nodule.  Lungs: Clear to auscultation bilaterally with normal respiratory effort. CV: Nondisplaced PMI.  Heart regular S1/S2, no S3/S4, no murmur. 1+ ankle edema.  No carotid bruit.  Normal pedal pulses.  Abdomen: Soft, nontender, no hepatosplenomegaly, no distention.  Skin: Intact without lesions or rashes.  Neurologic: Alert and oriented x 3.  Psych: Normal affect. Extremities: No clubbing or cyanosis.  HEENT: Normal.   Recent Labs: 09/19/2020: BUN 21; Creatinine, Ser 1.27; Hemoglobin 14.2; Platelets 222; Potassium 4.6; Sodium 138  Personally reviewed   Wt Readings from Last 3 Encounters:  09/19/20 88.3 kg (194 lb 9.6 oz)  09/12/20 87.5 kg (193 lb)  08/24/20 88 kg (194 lb)     ASSESSMENT AND PLAN:  1. 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  St Jude ICD placed and later removed with improved EF.  Possibly PVC-related as there seemed to have been improvement in EF with decreasing PVC burden (2.5% on last Zio  patch monitor in 6/20). He had PCI in 4/16 to distal LAD and PDA, but suspect that his cardiomyopathy is not predominantly ischemic (pre-dated coronary disease and out of proportion to coronary disease).  Echo in 10/18 showed EF back to 40-45%. Echo in 1/21 showed EF up to normal range, 55-60% with normal RV.  NYHA class I-II symptoms.  - He is off Entresto, apparently he had angioedema with lisinopril in the past so should not restart Entresto given significant risk of cross-reactivity.  ARBs do not have a high risk of angioedema despite angioedema with ACEI and generally are well-tolerated with close followup.  Continue losartan 25 mg daily.  - Continue spironolactone 25 mg daily.   - Rather than taking Coreg 6.25 mg once daily, I asked him to take Coreg 3.125 mg bid.  - Continue empagliflozin.  - He has had his ICD removed with improved EF.  - I will arrange for repeat echo.  2. 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 Epicardial/endocardial ablation in 6/17 at [facility] was unsuccessful, likely due to mid-myocardial focus.  However, Zio patch monitor in 6/20 showed PVCs down to 2.5% of total beats. 3. CKD:  Stage 3, BMET today.  - Should avoid all NSAIDs.   4. CAD: Nonobstructive CAD on 2011 cath but 4/16 cath at [facility] with distal LAD and PDA stenoses treated with DES.  As above, do not think coronary disease can explain extent of his prior cardiomyopathy.  - No ASA given use of apixaban.   - Continue Crestor 10 mg daily, will check lipids.  5. DVT: Spontaneous (no trigger).   6. Type II diabetes: He sees Dr. Cruzita Lederer for endocrinology.  He is on empagliflozin. 7. Atrial fibrillation: Paroxysmal.  He does not feel.  Associated with 1/21 TIA.  NSR today.  - Continue apixaban 5 mg bid.    Followup in 6 months if echo shows normal EF.  BMET at 3 months.    Signed, Loralie Champagne, MD  09/19/2020  [facility] [address] [facility]  [place]  [address] [phone] (office) [phone] (fax)

## 2020-09-22 ENCOUNTER — Other Ambulatory Visit (HOSPITAL_COMMUNITY): Payer: Self-pay

## 2020-09-28 ENCOUNTER — Other Ambulatory Visit (HOSPITAL_COMMUNITY): Payer: Self-pay

## 2020-09-28 ENCOUNTER — Telehealth: Payer: Self-pay | Admitting: Internal Medicine

## 2020-09-28 ENCOUNTER — Encounter: Payer: Self-pay | Admitting: Internal Medicine

## 2020-09-28 MED ORDER — APIXABAN 5 MG PO TABS
5.0000 mg | ORAL_TABLET | Freq: Two times a day (BID) | ORAL | 1 refills | Status: DC
Start: 2020-09-28 — End: 2021-04-07
  Filled 2020-09-28: qty 180, 90d supply, fill #0
  Filled 2020-12-30: qty 180, 90d supply, fill #1

## 2020-09-28 NOTE — Telephone Encounter (Signed)
*  STAT* If patient is at the pharmacy, call can be transferred to refill team.   1. Which medications need to be refilled? (please list name of each medication and dose if known)  apixaban (ELIQUIS) 5 MG TABS tablet  2. Which pharmacy/location (including street and city if local pharmacy) is medication to be sent to? [facility]  3. Do they need a 30 day or 90 day supply? 90  Patient is out of medication

## 2020-09-28 NOTE — Telephone Encounter (Signed)
Age 75, weight 88kg, SCr 1.27 on 09/19/20, last visit 09/19/20, afib indication, refill sent in

## 2020-09-29 ENCOUNTER — Other Ambulatory Visit (HOSPITAL_COMMUNITY): Payer: Self-pay

## 2020-10-02 ENCOUNTER — Other Ambulatory Visit: Payer: Self-pay | Admitting: Internal Medicine

## 2020-10-02 DIAGNOSIS — E291 Testicular hypofunction: Secondary | ICD-10-CM

## 2020-10-03 ENCOUNTER — Other Ambulatory Visit: Payer: Self-pay

## 2020-10-03 ENCOUNTER — Other Ambulatory Visit (HOSPITAL_COMMUNITY): Payer: Self-pay

## 2020-10-03 ENCOUNTER — Other Ambulatory Visit (INDEPENDENT_AMBULATORY_CARE_PROVIDER_SITE_OTHER): Payer: Medicare Other

## 2020-10-03 DIAGNOSIS — E291 Testicular hypofunction: Secondary | ICD-10-CM | POA: Diagnosis not present

## 2020-10-09 ENCOUNTER — Telehealth: Payer: Self-pay | Admitting: Internal Medicine

## 2020-10-09 LAB — TESTOSTERONE, FREE AND TOTAL (INCLUDES SHBG)-(MALES)
% Free Testosterone: 0.9 %
Free Testosterone, S: 31 pg/mL — ABNORMAL LOW
Sex Hormone Binding Globulin: 44.4 nmol/L
Testosterone, Serum (Total): 342 ng/dL

## 2020-10-09 NOTE — Telephone Encounter (Signed)
Called and advised pt per Dr Cruzita Lederer: Dear Paul [name], The testosterone results returned today. The total testosterone is normal, while the measured free testosterone level is low, at 31. However, I usually use the testosterone calculator as the calculated free testosterone is much more accurate. Guidelines do not recommend to use the directly measured free testosterone unless this is measured by equilibrium dialysis. Based on the [employer] calculator, your testosterone is still on the lower end, but in the normal range, at 59. For now, we do not necessarily need to start testosterone, but we can definitely repeat the test in few months. Please let me know if this is ok with you. Sincerely, Salena Saner Pt would like to discuss options.

## 2020-10-09 NOTE — Telephone Encounter (Signed)
Patient called asking if someone could give him a call back to discuss the results from the lab done on 10/03/20. He said it is not showing in his mychart for him to see. Ph# [phone]

## 2020-10-10 NOTE — Telephone Encounter (Signed)
Pt contacted and agrees to await follow up appt for further evaluation.

## 2020-10-10 NOTE — Telephone Encounter (Signed)
Pt stated he could not read the lab results/ comment you left but he could see his Mychart messages.

## 2020-10-16 ENCOUNTER — Ambulatory Visit (HOSPITAL_COMMUNITY): Payer: TRICARE For Life (TFL)

## 2020-10-16 DIAGNOSIS — Z111 Encounter for screening for respiratory tuberculosis: Secondary | ICD-10-CM | POA: Diagnosis not present

## 2020-10-20 ENCOUNTER — Other Ambulatory Visit (HOSPITAL_COMMUNITY): Payer: Self-pay

## 2020-10-20 MED ORDER — TAMSULOSIN HCL 0.4 MG PO CAPS
0.4000 mg | ORAL_CAPSULE | Freq: Every day | ORAL | 3 refills | Status: DC
  Filled 2020-10-20: qty 90, 90d supply, fill #0
  Filled 2021-01-19: qty 90, 90d supply, fill #1
  Filled 2021-04-21: qty 90, 90d supply, fill #2

## 2020-10-21 ENCOUNTER — Other Ambulatory Visit: Payer: Self-pay

## 2020-10-21 ENCOUNTER — Other Ambulatory Visit (HOSPITAL_COMMUNITY): Payer: Self-pay

## 2020-10-21 MED FILL — Spironolactone Tab 25 MG: ORAL | 90 days supply | Qty: 90 | Fill #0 | Status: AC

## 2020-10-28 ENCOUNTER — Other Ambulatory Visit (HOSPITAL_COMMUNITY): Payer: Self-pay

## 2020-10-28 MED FILL — Losartan Potassium Tab 25 MG: ORAL | 90 days supply | Qty: 90 | Fill #0 | Status: AC

## 2020-10-28 MED FILL — Metformin HCl Tab ER 24HR 500 MG: ORAL | 90 days supply | Qty: 360 | Fill #0 | Status: AC

## 2020-10-30 ENCOUNTER — Other Ambulatory Visit (HOSPITAL_COMMUNITY): Payer: Self-pay

## 2020-11-11 ENCOUNTER — Other Ambulatory Visit (HOSPITAL_COMMUNITY): Payer: Self-pay

## 2020-11-11 MED FILL — Empagliflozin Tab 10 MG: ORAL | 90 days supply | Qty: 90 | Fill #0 | Status: AC

## 2020-11-14 ENCOUNTER — Other Ambulatory Visit (HOSPITAL_COMMUNITY): Payer: Self-pay

## 2020-11-17 ENCOUNTER — Ambulatory Visit (HOSPITAL_COMMUNITY): Payer: Medicare Other

## 2020-12-06 ENCOUNTER — Other Ambulatory Visit (HOSPITAL_COMMUNITY): Payer: Self-pay

## 2020-12-06 MED ORDER — TESTOSTERONE 20.25 MG/ACT (1.62%) TD GEL
TRANSDERMAL | 2 refills | Status: DC
  Filled 2020-12-06 – 2020-12-20 (×2): qty 75, 30d supply, fill #0
  Filled 2020-12-22: qty 75, 60d supply, fill #0
  Filled 2021-04-21: qty 75, 60d supply, fill #1
  Filled 2021-04-21: qty 75, 30d supply, fill #1

## 2020-12-07 ENCOUNTER — Other Ambulatory Visit (HOSPITAL_COMMUNITY): Payer: Self-pay

## 2020-12-08 ENCOUNTER — Other Ambulatory Visit (HOSPITAL_COMMUNITY): Payer: Self-pay

## 2020-12-12 ENCOUNTER — Other Ambulatory Visit (HOSPITAL_COMMUNITY): Payer: Self-pay

## 2020-12-13 ENCOUNTER — Other Ambulatory Visit (HOSPITAL_COMMUNITY): Payer: Self-pay

## 2020-12-14 ENCOUNTER — Other Ambulatory Visit (HOSPITAL_COMMUNITY): Payer: Self-pay

## 2020-12-15 ENCOUNTER — Ambulatory Visit (HOSPITAL_COMMUNITY): Payer: Medicare Other

## 2020-12-16 ENCOUNTER — Other Ambulatory Visit (HOSPITAL_COMMUNITY): Payer: Self-pay

## 2020-12-16 MED FILL — Rosuvastatin Calcium Tab 10 MG: ORAL | 90 days supply | Qty: 90 | Fill #0 | Status: AC

## 2020-12-19 ENCOUNTER — Other Ambulatory Visit (HOSPITAL_COMMUNITY): Payer: Medicare Other

## 2020-12-20 ENCOUNTER — Other Ambulatory Visit (HOSPITAL_COMMUNITY): Payer: Self-pay

## 2020-12-20 MED ORDER — TESTOSTERONE 20.25 MG/ACT (1.62%) TD GEL
TRANSDERMAL | 2 refills | Status: DC
  Filled 2021-03-16: qty 75, 30d supply, fill #0

## 2020-12-22 ENCOUNTER — Other Ambulatory Visit: Payer: Self-pay

## 2020-12-22 ENCOUNTER — Other Ambulatory Visit (HOSPITAL_COMMUNITY): Payer: Self-pay

## 2020-12-22 ENCOUNTER — Ambulatory Visit (HOSPITAL_COMMUNITY)
Admission: RE | Admit: 2020-12-22 | Discharge: 2020-12-22 | Disposition: A | Discharge status: Home / Self Care | Payer: Medicare Other | Source: Ambulatory Visit | Attending: Cardiology | Admitting: Cardiology

## 2020-12-22 DIAGNOSIS — E119 Type 2 diabetes mellitus without complications: Secondary | ICD-10-CM | POA: Diagnosis not present

## 2020-12-22 DIAGNOSIS — I2510 Atherosclerotic heart disease of native coronary artery without angina pectoris: Secondary | ICD-10-CM | POA: Insufficient documentation

## 2020-12-22 DIAGNOSIS — I130 Hypertensive heart and chronic kidney disease with heart failure and stage 1 through stage 4 chronic kidney disease, or unspecified chronic kidney disease: Secondary | ICD-10-CM | POA: Insufficient documentation

## 2020-12-22 DIAGNOSIS — K219 Gastro-esophageal reflux disease without esophagitis: Secondary | ICD-10-CM | POA: Insufficient documentation

## 2020-12-22 DIAGNOSIS — I5022 Chronic systolic (congestive) heart failure: Secondary | ICD-10-CM

## 2020-12-22 DIAGNOSIS — N189 Chronic kidney disease, unspecified: Secondary | ICD-10-CM | POA: Insufficient documentation

## 2020-12-22 DIAGNOSIS — E1122 Type 2 diabetes mellitus with diabetic chronic kidney disease: Secondary | ICD-10-CM | POA: Insufficient documentation

## 2020-12-22 DIAGNOSIS — I429 Cardiomyopathy, unspecified: Secondary | ICD-10-CM | POA: Diagnosis not present

## 2020-12-22 LAB — ECHOCARDIOGRAM COMPLETE
Area-P 1/2: 2.91 cm2
Calc EF: 51 %
S' Lateral: 3.8 cm
Single Plane A2C EF: 50.5 %
Single Plane A4C EF: 50.6 %

## 2020-12-22 MED ORDER — CLINDAMYCIN HCL 300 MG PO CAPS
300.0000 mg | ORAL_CAPSULE | Freq: Four times a day (QID) | ORAL | 0 refills | Status: DC
  Filled 2020-12-22: qty 40, 10d supply, fill #0

## 2020-12-22 NOTE — Progress Notes (Signed)
  Echocardiogram 2D Echocardiogram has been performed.  Michiel Cowboy 12/22/2020, 3:44 PM

## 2020-12-26 ENCOUNTER — Other Ambulatory Visit (HOSPITAL_COMMUNITY): Payer: Self-pay

## 2020-12-30 ENCOUNTER — Other Ambulatory Visit (HOSPITAL_COMMUNITY): Payer: Self-pay

## 2020-12-30 MED FILL — Dulaglutide Soln Auto-injector 3 MG/0.5ML: SUBCUTANEOUS | 84 days supply | Qty: 6 | Fill #0 | Status: AC

## 2021-01-05 DIAGNOSIS — H524 Presbyopia: Secondary | ICD-10-CM | POA: Diagnosis not present

## 2021-01-05 DIAGNOSIS — H4911 Fourth [trochlear] nerve palsy, right eye: Secondary | ICD-10-CM | POA: Diagnosis not present

## 2021-01-05 DIAGNOSIS — Z961 Presence of intraocular lens: Secondary | ICD-10-CM | POA: Diagnosis not present

## 2021-01-11 ENCOUNTER — Encounter: Payer: Self-pay | Admitting: Internal Medicine

## 2021-01-19 ENCOUNTER — Other Ambulatory Visit (HOSPITAL_COMMUNITY): Payer: Self-pay

## 2021-01-19 ENCOUNTER — Other Ambulatory Visit: Payer: Self-pay | Admitting: Internal Medicine

## 2021-01-19 MED FILL — Spironolactone Tab 25 MG: ORAL | 90 days supply | Qty: 90 | Fill #1 | Status: AC

## 2021-01-19 MED FILL — Losartan Potassium Tab 25 MG: ORAL | 90 days supply | Qty: 90 | Fill #1 | Status: AC

## 2021-01-20 ENCOUNTER — Other Ambulatory Visit (HOSPITAL_COMMUNITY): Payer: Self-pay

## 2021-01-22 ENCOUNTER — Other Ambulatory Visit (HOSPITAL_COMMUNITY): Payer: Self-pay

## 2021-01-22 ENCOUNTER — Other Ambulatory Visit: Payer: Self-pay | Admitting: Internal Medicine

## 2021-01-23 ENCOUNTER — Other Ambulatory Visit (HOSPITAL_COMMUNITY): Payer: Self-pay

## 2021-01-23 ENCOUNTER — Encounter (INDEPENDENT_AMBULATORY_CARE_PROVIDER_SITE_OTHER): Payer: Medicare Other | Admitting: Ophthalmology

## 2021-01-23 MED FILL — Metformin HCl Tab ER 24HR 500 MG: ORAL | 90 days supply | Qty: 360 | Fill #0 | Status: AC

## 2021-01-26 ENCOUNTER — Other Ambulatory Visit: Payer: Self-pay | Admitting: Internal Medicine

## 2021-01-26 ENCOUNTER — Other Ambulatory Visit (HOSPITAL_COMMUNITY): Payer: Self-pay

## 2021-01-26 MED ORDER — ONETOUCH VERIO VI STRP
ORAL_STRIP | 3 refills | Status: DC
  Filled 2021-01-26: qty 300, 90d supply, fill #0
  Filled 2021-02-09: qty 100, 33d supply, fill #0
  Filled 2021-02-09: qty 300, 90d supply, fill #0

## 2021-01-29 ENCOUNTER — Other Ambulatory Visit (HOSPITAL_COMMUNITY): Payer: Self-pay

## 2021-01-29 ENCOUNTER — Other Ambulatory Visit: Payer: Self-pay | Admitting: Internal Medicine

## 2021-01-29 MED ORDER — TIRZEPATIDE 10 MG/0.5ML ~~LOC~~ SOAJ
10.0000 mg | SUBCUTANEOUS | 3 refills | Status: DC
  Filled 2021-01-29: qty 2, 28d supply, fill #0

## 2021-01-30 ENCOUNTER — Other Ambulatory Visit (HOSPITAL_COMMUNITY): Payer: Self-pay

## 2021-02-08 ENCOUNTER — Other Ambulatory Visit (HOSPITAL_COMMUNITY): Payer: Self-pay

## 2021-02-09 ENCOUNTER — Other Ambulatory Visit (HOSPITAL_COMMUNITY): Payer: Self-pay

## 2021-02-09 MED FILL — Empagliflozin Tab 10 MG: ORAL | 90 days supply | Qty: 90 | Fill #1 | Status: AC

## 2021-02-10 ENCOUNTER — Other Ambulatory Visit (HOSPITAL_COMMUNITY): Payer: Self-pay

## 2021-02-12 ENCOUNTER — Other Ambulatory Visit (HOSPITAL_COMMUNITY): Payer: Self-pay

## 2021-02-17 ENCOUNTER — Other Ambulatory Visit (HOSPITAL_COMMUNITY): Payer: Self-pay

## 2021-03-02 ENCOUNTER — Encounter (INDEPENDENT_AMBULATORY_CARE_PROVIDER_SITE_OTHER): Payer: Medicare Other | Admitting: Ophthalmology

## 2021-03-02 ENCOUNTER — Other Ambulatory Visit: Payer: Self-pay

## 2021-03-02 DIAGNOSIS — H33302 Unspecified retinal break, left eye: Secondary | ICD-10-CM

## 2021-03-02 DIAGNOSIS — I10 Essential (primary) hypertension: Secondary | ICD-10-CM

## 2021-03-02 DIAGNOSIS — E113293 Type 2 diabetes mellitus with mild nonproliferative diabetic retinopathy without macular edema, bilateral: Secondary | ICD-10-CM | POA: Diagnosis not present

## 2021-03-02 DIAGNOSIS — H35033 Hypertensive retinopathy, bilateral: Secondary | ICD-10-CM

## 2021-03-02 DIAGNOSIS — H43813 Vitreous degeneration, bilateral: Secondary | ICD-10-CM

## 2021-03-04 ENCOUNTER — Encounter (HOSPITAL_COMMUNITY): Payer: Self-pay

## 2021-03-05 ENCOUNTER — Other Ambulatory Visit (HOSPITAL_COMMUNITY): Payer: Self-pay

## 2021-03-05 ENCOUNTER — Other Ambulatory Visit (HOSPITAL_COMMUNITY): Payer: Self-pay | Admitting: *Deleted

## 2021-03-05 MED ORDER — CARVEDILOL 3.125 MG PO TABS
3.1250 mg | ORAL_TABLET | Freq: Two times a day (BID) | ORAL | 6 refills | Status: DC
  Filled 2021-03-05 – 2021-04-07 (×2): qty 60, 30d supply, fill #0

## 2021-03-13 ENCOUNTER — Ambulatory Visit: Payer: Medicare Other | Admitting: Internal Medicine

## 2021-03-13 NOTE — Progress Notes (Deleted)
Patient ID: Paul [name], male   DOB: 1945/10/03, 75 y.o.   MRN: [id number]  This visit occurred during the SARS-CoV-2 public health emergency.  Safety protocols were in place, including screening questions prior to the visit, additional usage of staff PPE, and extensive cleaning of exam room while observing appropriate contact time as indicated for disinfecting solutions.    HPI: Paul [name] is a 75 y.o.-year-old male, returning for f/u for DM2, dx in 2001, uncontrolled, with complications (non-iCMP - CHF, cerebro-vascular ds - s/p TIA, CKD, mild PN, ED, DR).  Last visit 6 months ago.  Interim history: No increased urination, blurry vision, nausea, chest pain. He was able to start [facility] since last visit.  DM2: Reviewed HbA1c levels: Lab Results  Component Value Date   HGBA1C 6.4 (A) 09/12/2020   HGBA1C 6.9 (A) 03/15/2020   HGBA1C 7.4 (A) 09/09/2019  09/23/2014: 8.8%  Pt is on a regimen of: - Metformin XR 2000 mg in a.m.  stopped due to increased urination and dehydration >> Jardiance 10 mg in a.m. (started 08/2018) - Victoza 1.8  mg daily in am >> Trulicity 1.5 >> 3 mg >> Mounjaro 10 mg weekly - Lantus 38 >> 40 >> 36-38 >> 42 >> 22-24 >> 24 >> 20 units at bedtime  He stopped Amaryl before our visit in 02/2019.  Pt checks his sugars 3 times a day: - am: 73, 74 ,90-137, 156, 165 >> 69, 73-118, 149 (ate out) - 2h after b'fast: n/c  - before lunch: n/c >> 221 (overcorrection of 56) >> n/c - 2h after lunch: n/c - before dinner: 110-190, 265 >> 120-130 >> n/c - 2h after dinner: n/c - bedtime:  123-182, 228 >> 117-148, 229 >> [place]  - nighttime: n/c >> 65, 70s 2x a week >> n/c Lowest sugar was 56 >> 79 >> 73 >> 69; he has hypoglycemia awareness in the 70s. Highest sugar was 265 >> 165 >> 149  Glucometer: OneTouch Ultra  Pt's meals are: - Breakfast: cereals + blueberries - Lunch: chinese food or sandwich, fruit - Dinner: meat + veggies or salad - Snacks: no He used  Nutrisystem 2x before >> lost 40 lbs.  At last visit, he was on a low-carb diet along with his wife. He stopped the diet afterwards, but at last visit he was only eating one apple for lunch daily.  -+ CKD: Lab Results  Component Value Date   BUN 21 09/19/2020   CREATININE 1.27 (H) 09/19/2020  07/23/2018: 32/1.57, GFR 44, glucose 142 02/19/2017: 19/1.37, GFR 52, glucose 143 09/23/2014: 13/1.1, GFR >60 02/19/2017: ACR 247 No results found for: MICRALBCREAT  He had angioedema with ACE inhibitors.  Paul [name] was not covered.  On losartan 25.  -+ HL; last set of lipids: Lab Results  Component Value Date   CHOL 114 09/19/2020   HDL 44 09/19/2020   LDLCALC 55 09/19/2020   TRIG 74 09/19/2020   CHOLHDL 2.6 09/19/2020  07/23/2018: 117/135/32/63 02/19/2017: 149/218/30/87  09/2014: 158/157/25/100 On Crestor 10.  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  Paul [name] and Paul [name].  -+ Numbness and tingling in his left foot  H/o hypogonadism He was previously on AndroGel but this stopped being covered >> he had to stop the medication with testosterone levels were normal afterwards: Component     Latest Ref Rng & Units 10/03/2020  Testosterone, Serum (Total)     ng/dL 342  % Free Testosterone     %  0.9  Free Testosterone, S     pg/mL 31 (L) -however, the calculated free testosterone is normal, 59 (Omnicalculator)  Sex Hormone Binding Globulin     nmol/L 44.4   07/23/2018: Free testosterone 32.7 (6-73) Component     Latest Ref Rng & Units 01/20/2016  Testosterone,Total,LC/MS/MS     250 - 1,100 ng/dL 336  Testosterone, Free     35.0 - 155.0 pg/mL 66.2  Sex Hormone Binding Glob.     22 - 77 nmol/L 27   PSA levels were normal: Lab Results  Component Value Date   PSA 0.9 01/20/2016   PSA 1.50 07/06/2015   PSA 1.16 10/16/2012   Patient has a history of trigeminy, status post endocardial ablation at [facility]. He had an epicardial ablation in 08/2014. During that time,  he had 2 stents latest, and the ablation procedure was postponed. He is followed by Paul [name].  He has OSA and is on a CPAP.  He has a history of DVT in the left leg, which is more edematous. He continues on Coreg, Lasix, spironolactone.  He is seen in the CHF clinic. He had 2x TIA 06/2019, 6 weeks post his R TKR. He had an event monitor - no arrhythmia. On Xarelto.  ROS: Constitutional: no weight gain/+ weight loss, no fatigue, no subjective hyperthermia, no subjective hypothermia Eyes: no blurry vision, no xerophthalmia ENT: no sore throat, no nodules palpated in neck, no dysphagia, no odynophagia, no hoarseness Cardiovascular: no CP/no SOB/no palpitations/no leg swelling Respiratory: no cough/no SOB/no wheezing Gastrointestinal: no N/no V/no D/no C/no acid reflux Musculoskeletal: no muscle aches/no joint aches, + occasional nighttime cramps in toes Skin: no rashes, no hair loss Neurological: no tremors/+ numbness/+ tingling/no dizziness  I reviewed pt's medications, allergies, PMH, social hx, family hx, and changes were documented in the history of present illness. Otherwise, unchanged from my initial visit note.  Past Medical History:  Diagnosis Date   Aftercare following removal/replacement defibrillator    a. defibrillator removed 03/2019.   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 03/06/2015   NICM (nonischemic cardiomyopathy) ([place])     Sleep apnea    cpap   Type II diabetes mellitus ([place])    Ventricular tachycardia ([place])    observed during stress testing, subsequent VT on EPS   Wears glasses    Past Surgical History:  Procedure Laterality Date   ABLATION  06-01-2013   PVC's ablated along basal inferoseptal RV by Dr Rayann Heman   APPENDECTOMY  1969   CARDIAC CATHETERIZATION  04/2010   CARDIAC DEFIBRILLATOR PLACEMENT  05/09/2010   MDT ICD implanted in [place] by Dr Ky Barban   CATARACT EXTRACTION W/ INTRAOCULAR LENS  IMPLANT, BILATERAL Bilateral    ICD LEAD REMOVAL N/A 03/19/2019   Procedure: ICD LEAD AND GENERATOR REMOVAL;  Surgeon: Evans Lance, MD;  Location: [facility]  OR;  Service: Cardiovascular;  Laterality: N/A;   KNEE ARTHROSCOPY Left 1984   LUMBAR LAMINECTOMY  ~ 2004   TONSILLECTOMY AND ADENOIDECTOMY  1972   TOTAL KNEE ARTHROPLASTY Left 04/28/2017   TOTAL KNEE ARTHROPLASTY Left 04/28/2017   Procedure: TOTAL KNEE ARTHROPLASTY LEFT;  Surgeon: Dorna Leitz, MD;  Location: [facility];  Service: Orthopedics;  Laterality: Left;   TOTAL KNEE ARTHROPLASTY Right 05/07/2019   Procedure: TOTAL KNEE ARTHROPLASTY;  Surgeon: Dorna Leitz, MD;  Location: [facility] ORS;  Service: Orthopedics;  Laterality: Right;   V-TACH ABLATION N/A 06/01/2013   Procedure: V-TACH ABLATION;  Surgeon: Coralyn Mark, MD;  Location: [facility] CATH LAB;  Service: Cardiovascular;  Laterality: N/A;   [place] SURGERY  06/01/2013   History   Social History   Marital Status: Married    Spouse Name: N/A   Number of Children: 2   Occupational History   physician   Social History Main Topics   Smoking status: Never Smoker    Smokeless tobacco: Never Used   Alcohol Use: 1.2 oz/week    1 Glasses scotch, rarely   Drug Use: No   Social History Narrative   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    Moved here from [facility] 1.5 years ago   Current Outpatient Medications on File  Prior to Visit  Medication Sig Dispense Refill   acetaminophen (TYLENOL) 500 MG tablet Take 1,000 mg by mouth every 6 (six) hours as needed for mild pain, moderate pain or headache.      apixaban (ELIQUIS) 5 MG TABS tablet Take 1 tablet (5 mg total) by mouth 2 (two) times daily. 180 tablet 1   Blood Glucose Monitoring Suppl (ONETOUCH VERIO) w/Device KIT Use to check blood sugar 3 times a day. 1 kit 0   carvedilol (COREG) 3.125 MG tablet Take 1 tablet (3.125 mg total) by mouth 2 (two) times daily with a meal. 60 tablet 6   clindamycin (CLEOCIN) 300 MG capsule Take 1 capsule (300 mg total) by mouth 4 (four) times daily for 10 days 40 capsule 0   Dulaglutide (TRULICITY) 3 AV/4.0JW SOPN Inject 3 mg into the skin once a week. 6 mL 3   Dulaglutide 3 MG/0.5ML SOPN INJECT 1 PEN INTO THE SKIN ONCE A WEEK. 6 mL 3   empagliflozin (JARDIANCE) 10 MG TABS tablet Take 1 tablet (10 mg total) by mouth daily. 90 tablet 3   empagliflozin (JARDIANCE) 10 MG TABS tablet TAKE 1 TABLET BY MOUTH ONCE A DAY 90 tablet 3   glucose blood (ONETOUCH VERIO) test strip Use to check blood sugar 3 times a day. 300 each 3   insulin glargine (LANTUS SOLOSTAR) 100 UNIT/ML Solostar Pen Inject 24-28 Units into the skin at bedtime. 45 mL 3   insulin glargine (LANTUS) 100 UNIT/ML Solostar Pen INJECT 24-28 UNITS INTO THE SKIN AT BEDTIME. 45 mL 3   Insulin Pen Needle (UNIFINE PENTIPS) 32G X 4 MM MISC USE TWICE DAILY WITH LANTUS AND VICTOZA 100 each 11   Insulin Pen Needle 32G X 4 MM MISC USE TWICE DAILY WITH LANTUS AND VICTOZA 100 each 11   Lancets MISC Glucose monitor to check blood sugars tid dx code E11.9 brand per insurance coverage 300 each 1   losartan (COZAAR) 25 MG tablet Take 1 tablet (25 mg total) by mouth daily. 90 tablet 3   losartan (COZAAR) 25 MG tablet TAKE 1 TABLET  BY MOUTH ONCE A DAY 90 tablet 3   metFORMIN (GLUCOPHAGE-XR) 500 MG 24 hr tablet TAKE 4 TABLETS BY MOUTH DAILY WITH BREAKFAST 360 tablet 1   metFORMIN  (GLUCOPHAGE-XR) 500 MG 24 hr tablet TAKE 4 TABLETS BY MOUTH DAILY WITH BREAKFAST 360 tablet 1   PEG-KCl-NaCl-NaSulf-Na Asc-C 140 g SOLR TAKE AS DIRECTED 3 each 0   rosuvastatin (CRESTOR) 10 MG tablet Take 1 tablet (10 mg total) by mouth daily. 90 tablet 3   rosuvastatin (CRESTOR) 10 MG tablet TAKE 1 TABLET BY MOUTH ONCE A DAY 90 tablet 3   spironolactone (ALDACTONE) 25 MG tablet Take 1 tablet (25 mg total) by mouth daily. 90 tablet 3   spironolactone (ALDACTONE) 25 MG tablet TAKE 1 TABLET BY MOUTH ONCE A DAY 90 tablet 3   tamsulosin (FLOMAX) 0.4 MG CAPS capsule Take 0.4 mg by mouth at bedtime.      tamsulosin (FLOMAX) 0.4 MG CAPS capsule Take 1 capsule (0.4 mg total) by mouth daily. 90 capsule 3   Testosterone (ANDROGEL PUMP) 20.25 MG/ACT (1.62%) GEL Apply once per day as directed 75 g 2   Testosterone (ANDROGEL PUMP) 20.25 MG/ACT (1.62%) GEL Apply once a day 75 g 2   tirzepatide (MOUNJARO) 10 MG/0.5ML Pen Inject 10 mg into the skin once a week. 6 mL 3   No current facility-administered medications on file prior to visit.   Allergies  Allergen Reactions   Lisinopril Shortness Of Breath, Swelling and Other (See Comments)    Angioedema    Family History  Problem Relation Age of Onset   Alcohol abuse Mother    COPD Mother    Depression Mother    Heart disease Mother    Hypertension Mother    Stroke Mother    COPD Father    Heart disease Father    Hypertension Father    Stroke Father    Cancer Father    Diabetes Sister    Mental retardation Sister    Learning disabilities Sister    Hypertension Sister    Alcohol abuse Brother    Drug abuse Brother    Heart disease Brother    Hypertension Brother    Coronary artery disease Brother    Colon cancer Neg Hx    Pancreatic cancer Neg Hx    Esophageal cancer Neg Hx    Stomach cancer Neg Hx    Liver disease Neg Hx    Rectal cancer Neg Hx    PE: There were no vitals taken for this visit. There is no height or weight on file to  calculate BMI. Wt Readings from Last 3 Encounters:  09/19/20 194 lb 9.6 oz (88.3 kg)  09/12/20 193 lb (87.5 kg)  08/24/20 194 lb (88 kg)   Constitutional: overweight, in NAD Eyes: PERRLA, EOMI, no exophthalmos ENT: moist mucous membranes, no thyromegaly, no cervical lymphadenopathy Cardiovascular: RRR, No MRG Respiratory: CTA B Gastrointestinal: abdomen soft, NT, ND, BS+ Musculoskeletal: no deformities, strength intact in all 4 Skin: moist, warm, Neurological: no tremor with outstretched hands, DTR normal in all 4  ASSESSMENT: 1. DM2, insulin-dependent, uncontrolled, with complications - non-iCMP - CHF - cerebro-vascular ds. - s/p TIA x2 06/2019 - CKD - DR - mild PN - ED  2.  History of hypogonadism  3. HL  PLAN:  1. Patient with longstanding, previously uncontrolled type 2 diabetes, on metformin, SGLT2 inhibitor, long-acting insulin, and weekly GLP-1 receptor agonist, with improved control lately after improving his diet and increasing his Trulicity dose.  Before last visit he also lost a significant amount of weight (10 pounds).  Sugars were very well controlled with even some lower values, in the 60s, in the morning.  He also had a hypoglycemic event during the night, but he did not check his blood sugars at that time, he drank orange juice based on his symptoms.  At last visit, we reduced his Lantus dose and I advised him to reduce it even further if he continues to see low blood sugars.  HbA1c at that time was excellent, at 6.4% -Since last visit, he contacted me that he wanted to try [facility].  I advised him to take 10 mg weekly and stopped Trulicity.  - I suggested to:  Patient Instructions  Please continue: - Metformin 2000 mg with breakfast - Jardiance 10 mg before b'fast - Mounjaro 10 mg weekly - Lantus 20 units at bedtime   Please return in 6 months with your sugar log.   - we checked his HbA1c: 7%  - advised to check sugars at different times of the day - 1x a  day, rotating check times - advised for yearly eye exams >> he is UTD - return to clinic in 6 months    2.  History of hypogonadism -Previously on AndroGel, however, this was not covered for 2 years and he is now off testosterone supplementation -Reviewed his testosterone levels from 07/2018 and this was normal -He had a repeat testosterone level in 09/2020 and the directly measured free testosterone appeared to be low, however, it was normal based on Omni calculator.  I discussed with the patient that the directly measured free testosterone is not a very accurate test -No further investigation is needed for now  3. HL -Reviewed latest lipid panel from 09/2020: Excellent lipid fractions: Lab Results  Component Value Date   CHOL 114 09/19/2020   HDL 44 09/19/2020   LDLCALC 55 09/19/2020   TRIG 74 09/19/2020   CHOLHDL 2.6 09/19/2020  -He continues on Crestor 10 mg daily without side effects  Philemon Kingdom, MD PhD [facility] Endocrinology

## 2021-03-16 ENCOUNTER — Other Ambulatory Visit (HOSPITAL_COMMUNITY): Payer: Self-pay

## 2021-03-16 MED ORDER — MOUNJARO 5 MG/0.5ML ~~LOC~~ SOAJ
SUBCUTANEOUS | 5 refills | Status: DC
  Filled 2021-03-16: qty 2, 28d supply, fill #0
  Filled 2021-04-07: qty 2, 28d supply, fill #1

## 2021-03-16 MED FILL — Rosuvastatin Calcium Tab 10 MG: ORAL | 90 days supply | Qty: 90 | Fill #1 | Status: AC

## 2021-03-22 ENCOUNTER — Other Ambulatory Visit (HOSPITAL_COMMUNITY): Payer: Self-pay

## 2021-03-22 MED ORDER — FREESTYLE LIBRE 2 READER DEVI
0 refills | Status: DC
  Filled 2021-03-22: qty 1, 1d supply, fill #0

## 2021-03-22 MED ORDER — FREESTYLE LIBRE 2 SENSOR MISC
14 refills | Status: DC
  Filled 2021-03-22: qty 2, 28d supply, fill #0

## 2021-04-07 ENCOUNTER — Other Ambulatory Visit (HOSPITAL_COMMUNITY): Payer: Self-pay

## 2021-04-07 ENCOUNTER — Other Ambulatory Visit: Payer: Self-pay | Admitting: Internal Medicine

## 2021-04-09 ENCOUNTER — Other Ambulatory Visit (HOSPITAL_COMMUNITY): Payer: Self-pay

## 2021-04-09 MED ORDER — APIXABAN 5 MG PO TABS
5.0000 mg | ORAL_TABLET | Freq: Two times a day (BID) | ORAL | 1 refills | Status: DC
  Filled 2021-04-09 – 2021-04-21 (×2): qty 180, 90d supply, fill #0

## 2021-04-09 NOTE — Telephone Encounter (Signed)
Eliquis 5 mg refill request received. Patient is 75 years old, weight- 88.3 kg, Crea- 1.27 on 4./15/22 , Diagnosis- PVCs, and last seen by Dr. Caryl Comes on 09/29/20. Dose is appropriate based on dosing criteria. Will send in refill to requested pharmacy.

## 2021-04-11 ENCOUNTER — Telehealth (HOSPITAL_COMMUNITY): Payer: Self-pay | Admitting: Vascular Surgery

## 2021-04-11 NOTE — Telephone Encounter (Signed)
Returned pt call to make appt w/ [facility]

## 2021-04-18 ENCOUNTER — Other Ambulatory Visit (HOSPITAL_COMMUNITY): Payer: Self-pay

## 2021-04-21 ENCOUNTER — Other Ambulatory Visit (HOSPITAL_COMMUNITY): Payer: Self-pay

## 2021-04-21 MED FILL — Metformin HCl Tab ER 24HR 500 MG: ORAL | 90 days supply | Qty: 360 | Fill #1 | Status: CN

## 2021-04-21 MED FILL — Spironolactone Tab 25 MG: ORAL | 90 days supply | Qty: 90 | Fill #2 | Status: CN

## 2021-04-21 MED FILL — Losartan Potassium Tab 25 MG: ORAL | 90 days supply | Qty: 90 | Fill #2 | Status: CN

## 2021-06-01 ENCOUNTER — Other Ambulatory Visit: Payer: Self-pay

## 2021-06-01 ENCOUNTER — Encounter: Payer: Self-pay | Admitting: Internal Medicine

## 2021-06-01 ENCOUNTER — Ambulatory Visit (INDEPENDENT_AMBULATORY_CARE_PROVIDER_SITE_OTHER): Payer: Medicare Other | Admitting: Internal Medicine

## 2021-06-01 VITALS — BP 100/68 | HR 91 | Ht 71.0 in | Wt 183.2 lb

## 2021-06-01 DIAGNOSIS — E1159 Type 2 diabetes mellitus with other circulatory complications: Secondary | ICD-10-CM

## 2021-06-01 DIAGNOSIS — Z794 Long term (current) use of insulin: Secondary | ICD-10-CM

## 2021-06-01 DIAGNOSIS — E785 Hyperlipidemia, unspecified: Secondary | ICD-10-CM

## 2021-06-01 LAB — POCT GLYCOSYLATED HEMOGLOBIN (HGB A1C): Hemoglobin A1C: 6.9 % — AB (ref 4.0–5.6)

## 2021-06-01 NOTE — Progress Notes (Signed)
Patient ID: Paul [name], male   DOB: [birth year], 75 y.o.   MRN: [id number]  This visit occurred during the SARS-CoV-2 public health emergency.  Safety protocols were in place, including screening questions prior to the visit, additional usage of staff PPE, and extensive cleaning of exam room while observing appropriate contact time as indicated for disinfecting solutions.    HPI: Paul [name] is a 75 y.o.-year-old male, returning for f/u for DM2, dx in 2001, controlled, with complications (non-iCMP - CHF, cerebro-vascular ds - s/p TIA, CKD, mild PN, ED, DR).  Last visit 8.5 months ago.  Interim history: No increased urination, blurry vision, nausea, chest pain. He had significant weight gain since last visit-11 pounds.  He has decreased appetite. He started Testosterone 1 mo ago >> feels better on this. He started Finerenone.  DM2: Reviewed HbA1c levels: Lab Results  Component Value Date   HGBA1C 6.4 (A) 09/12/2020   HGBA1C 6.9 (A) 03/15/2020   HGBA1C 7.4 (A) 09/09/2019  09/23/2014: 8.8%  Pt is on a regimen of: - Metformin XR 2000 mg in a.m.  stopped due to increased urination and dehydration >> Jardiance 10 mg in a.m. (started 08/2018) - Mounjaro 2.5 >> 5 >> 10 mg weekly - Lantus 38 >> 40 >> 36-38 >> 42 >> 22-24 >> 24 >> 20 units at bedtime >> stopped He stopped Amaryl before our visit in 02/2019.  Pt checks his sugars >4 times a day:   Prev.: - am:  73, 74 ,90-137, 156, 165 >> 69, 73-118, 149 (ate out) - 2h after b'fast: n/c  - before lunch: n/c >> 221 (overcorrection of 56) >> n/c - 2h after lunch: n/c - before dinner:  110-190, 265 >> 120-130 >> n/c - 2h after dinner: n/c - bedtime:  123-182, 228 >> 117-148, 229 >> [place]  - nighttime: n/c >> 65, 70s 2x a week >> n/c Lowest sugar was 56 >> 79 >> 73 >> 69 >> 70s; he has hypoglycemia awareness in the 70s. Highest sugar was 265 >> 165 >> 149 >> 200  Glucometer: OneTouch Ultra  Pt's meals are: - Breakfast: cereals  + blueberries - Lunch: chinese food or sandwich, fruit - Dinner: meat + veggies or salad - Snacks: no He used Nutrisystem 2x before >> lost 40 lbs.  He is eating one apple for lunch daily.  -+ CKD: Lab Results  Component Value Date   BUN 21 09/19/2020   CREATININE 1.27 (H) 09/19/2020  07/23/2018: 32/1.57, GFR 44, glucose 142 02/19/2017: 19/1.37, GFR 52, glucose 143 09/23/2014: 13/1.1, GFR >60 02/19/2017: ACR 247 No results found for: MICRALBCREAT  He had angioedema with ACE inhibitors.  [name] was not covered.  On losartan 25.  -+ HL; last set of lipids: Lab Results  Component Value Date   CHOL 114 09/19/2020   HDL 44 09/19/2020   LDLCALC 55 09/19/2020   TRIG 74 09/19/2020   CHOLHDL 2.6 09/19/2020  07/23/2018: 117/135/32/63 02/19/2017: 149/218/30/87  09/2014: 158/157/25/100 On Crestor 10.  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  Dr. Midge Aver and Dr. Zigmund Daniel.  -+ Numbness and tingling in his left foot  H/o hypogonadism He was previously on AndroGel but this stopped being covered >> he had to stop the medication with testosterone levels were normal afterwards: Component     Latest Ref Rng & Units 10/03/2020  Testosterone, Serum (Total)     ng/dL 342  % Free Testosterone     % 0.9  Free Testosterone,  S     pg/mL 31 (L) -calculated: 59 (normal)  Sex Hormone Binding Globulin     nmol/L 44.4   07/23/2018: Free testosterone 32.7 (6-73) Component     Latest Ref Rng & Units 01/20/2016  Testosterone,Total,LC/MS/MS     250 - 1,100 ng/dL 336  Testosterone, Free     35.0 - 155.0 pg/mL 66.2  Sex Hormone Binding Glob.     22 - 77 nmol/L 27   PSA levels were normal: Lab Results  Component Value Date   PSA 0.9 01/20/2016   PSA 1.50 07/06/2015   PSA 1.16 10/16/2012   Approximately a month ago he started back on gel.  This is prescribed by another physician.  He feels better on it.  Patient has a history of trigeminy, status post endocardial ablation at  [facility]. He had an epicardial ablation in 08/2014. During that time, he had 2 stents latest, and the ablation procedure was postponed. He is followed by Dr. Dwana Curd.  He has OSA and is on a CPAP.  He has a history of DVT in the left leg, which is more edematous. He continues on Coreg, Lasix, spironolactone.  He is seen in the CHF clinic. He had 2x TIA 06/2019, 6 weeks post his R TKR. He had an event monitor - no arrhythmia. On Xarelto.  ROS: Neurological: no tremors/+ numbness/+ tingling/no dizziness  I reviewed pt's medications, allergies, PMH, social hx, family hx, and changes were documented in the history of present illness. Otherwise, unchanged from my initial visit note.  Past Medical History:  Diagnosis Date   Aftercare following removal/replacement defibrillator    a. defibrillator removed 03/2019.   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 03/06/2015   NICM (nonischemic cardiomyopathy) ([place])    Sleep apnea    cpap   Type II diabetes mellitus ([place])    Ventricular tachycardia ([place])    observed during stress testing, subsequent VT on EPS   Wears glasses    Past Surgical History:  Procedure Laterality Date   ABLATION  06-01-2013   PVC's ablated along basal inferoseptal RV by Dr Rayann Heman   APPENDECTOMY  1969   CARDIAC CATHETERIZATION  04/2010   CARDIAC DEFIBRILLATOR PLACEMENT  05/09/2010   MDT ICD implanted in [place] by Dr Ky Barban    CATARACT EXTRACTION W/ INTRAOCULAR LENS  IMPLANT, BILATERAL Bilateral    ICD LEAD REMOVAL N/A 03/19/2019   Procedure: ICD LEAD AND GENERATOR REMOVAL;  Surgeon: [place] Lance, MD;  Location: [facility] OR;  Service: Cardiovascular;  Laterality: N/A;   KNEE ARTHROSCOPY Left [place]  ~ 2004   TONSILLECTOMY AND ADENOIDECTOMY  1972   TOTAL KNEE ARTHROPLASTY Left 04/28/2017   TOTAL KNEE ARTHROPLASTY Left 04/28/2017   Procedure: TOTAL KNEE ARTHROPLASTY LEFT;  Surgeon: Dorna Leitz, MD;  Location: [facility];  Service: Orthopedics;  Laterality: Left;   TOTAL KNEE ARTHROPLASTY Right 05/07/2019   Procedure: TOTAL KNEE ARTHROPLASTY;  Surgeon: Dorna Leitz, MD;  Location: [facility] ORS;  Service: Orthopedics;  Laterality: Right;   V-TACH ABLATION N/A 06/01/2013   Procedure: V-TACH ABLATION;  Surgeon: Coralyn Mark, MD;  Location: [facility] CATH LAB;  Service: Cardiovascular;  Laterality: N/A;   [place] SURGERY  06/01/2013   History   Social History   Marital Status: Married    Spouse Name: N/A   Number of Children: 2   Occupational History   physician   Social History Main Topics   Smoking status: Never Smoker    Smokeless tobacco: Never Used   Alcohol Use: 1.2 oz/week    1 Glasses scotch, rarely   Drug Use: No   Social History Narrative   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    Moved here from [facility] [place] 1.5 years ago   Current Outpatient Medications on File Prior to Visit  Medication Sig Dispense Refill   acetaminophen (TYLENOL) 500 MG tablet Take 1,000 mg by mouth every 6 (six) hours as needed for mild pain, moderate pain or headache.      apixaban (ELIQUIS) 5 MG TABS tablet Take 1 tablet by mouth 2 times daily. 180 tablet 1   Blood Glucose Monitoring Suppl (ONETOUCH VERIO) w/Device KIT Use to check blood sugar 3 times a day. 1 kit 0   carvedilol (COREG) 3.125 MG tablet Take 1 tablet (3.125 mg total) by  mouth 2 (two) times daily with a meal. 60 tablet 6   clindamycin (CLEOCIN) 300 MG capsule Take 1 capsule (300 mg total) by mouth 4 (four) times daily for 10 days 40 capsule 0   Continuous Blood Gluc Receiver (FREESTYLE LIBRE 2 READER) DEVI Use daily to monitor blood glucose levels. 1 each 0   Continuous Blood Gluc Sensor (FREESTYLE LIBRE 2 SENSOR) MISC Use as directed to monitor blood gluse.  Change every 14 days. 2 each 14   Dulaglutide (TRULICITY) 3 EH/2.0NO SOPN Inject 3 mg into the skin once a week. 6 mL 3   Dulaglutide 3 MG/0.5ML SOPN INJECT 1 PEN INTO THE SKIN ONCE A WEEK. 6 mL 3   empagliflozin (JARDIANCE) 10 MG TABS tablet Take 1 tablet (10 mg total) by mouth daily. 90 tablet 3   empagliflozin (JARDIANCE) 10 MG TABS tablet TAKE 1 TABLET BY MOUTH ONCE A DAY 90 tablet 3   glucose blood (ONETOUCH VERIO) test strip Use to check blood sugar 3 times a day. 300 each 3   insulin glargine (LANTUS SOLOSTAR) 100 UNIT/ML Solostar Pen Inject 24-28 Units into the skin at bedtime. 45 mL 3   insulin glargine (LANTUS) 100 UNIT/ML Solostar Pen INJECT 24-28 UNITS INTO THE SKIN AT BEDTIME. 45 mL 3   Insulin Pen Needle (UNIFINE PENTIPS) 32G X 4 MM MISC USE TWICE DAILY WITH LANTUS AND VICTOZA 100 each 11   Insulin Pen Needle 32G X 4 MM MISC USE TWICE DAILY WITH LANTUS AND VICTOZA 100 each 11   Lancets MISC Glucose monitor to check blood sugars tid dx code E11.9 brand per insurance coverage 300 each 1   losartan (COZAAR) 25 MG tablet Take 1 tablet (25 mg total) by mouth daily. 90 tablet 3   losartan (COZAAR) 25 MG tablet TAKE 1 TABLET BY MOUTH ONCE A DAY 90 tablet 3   metFORMIN (GLUCOPHAGE-XR) 500 MG 24 hr tablet TAKE  4 TABLETS BY MOUTH DAILY WITH BREAKFAST 360 tablet 1   metFORMIN (GLUCOPHAGE-XR) 500 MG 24 hr tablet TAKE 4 TABLETS BY MOUTH DAILY WITH BREAKFAST 360 tablet 1   PEG-KCl-NaCl-NaSulf-Na Asc-C 140 g SOLR TAKE AS DIRECTED 3 each 0   rosuvastatin (CRESTOR) 10 MG tablet Take 1 tablet (10 mg total) by  mouth daily. 90 tablet 3   rosuvastatin (CRESTOR) 10 MG tablet TAKE 1 TABLET BY MOUTH ONCE A DAY 90 tablet 3   spironolactone (ALDACTONE) 25 MG tablet Take 1 tablet (25 mg total) by mouth daily. 90 tablet 3   spironolactone (ALDACTONE) 25 MG tablet TAKE 1 TABLET BY MOUTH ONCE A DAY 90 tablet 3   tamsulosin (FLOMAX) 0.4 MG CAPS capsule Take 0.4 mg by mouth at bedtime.      tamsulosin (FLOMAX) 0.4 MG CAPS capsule Take 1 capsule (0.4 mg total) by mouth daily. 90 capsule 3   Testosterone (ANDROGEL PUMP) 20.25 MG/ACT (1.62%) GEL Apply once per day as directed 75 g 2   Testosterone (ANDROGEL PUMP) 20.25 MG/ACT (1.62%) GEL Apply topically once a day 75 g 2   tirzepatide (MOUNJARO) 10 MG/0.5ML Pen Inject 10 mg into the skin once a week. 6 mL 3   tirzepatide (MOUNJARO) 5 MG/0.5ML Pen Inject 5 mg (0.5 ml) subcutaneously once a week 2 mL 5   No current facility-administered medications on file prior to visit.   Allergies  Allergen Reactions   Lisinopril Shortness Of Breath, Swelling and Other (See Comments)    Angioedema    Family History  Problem Relation Age of Onset   Alcohol abuse Mother    COPD Mother    Depression Mother    Heart disease Mother    Hypertension Mother    Stroke Mother    COPD Father    Heart disease Father    Hypertension Father    Stroke Father    Cancer Father    Diabetes Sister    Mental retardation Sister    Learning disabilities Sister    Hypertension Sister    Alcohol abuse Brother    Drug abuse Brother    Heart disease Brother    Hypertension Brother    Coronary artery disease Brother    Colon cancer Neg Hx    Pancreatic cancer Neg Hx    Esophageal cancer Neg Hx    Stomach cancer Neg Hx    Liver disease Neg Hx    Rectal cancer Neg Hx    PE: BP 100/68 (BP Location: Right Arm, Patient Position: Sitting, Cuff Size: Normal)    Pulse 91    Ht '5\' 11"'  (1.803 m)    Wt 183 lb 3.2 oz (83.1 kg)    SpO2 98%    BMI 25.55 kg/m   Wt Readings from Last 3  Encounters:  06/01/21 183 lb 3.2 oz (83.1 kg)  09/19/20 194 lb 9.6 oz (88.3 kg)  09/12/20 193 lb (87.5 kg)   Constitutional: overweight, in NAD Eyes: PERRLA, EOMI, no exophthalmos ENT: moist mucous membranes, no thyromegaly, no cervical lymphadenopathy Cardiovascular: RRR, No MRG Respiratory: CTA B Musculoskeletal: no deformities, strength intact in all 4 Skin: moist, warm Neurological: no tremor with outstretched hands, DTR normal in all 4  ASSESSMENT: 1. DM2, insulin-dependent, now controlled, with complications - non-iCMP - CHF - cerebro-vascular ds. - s/p TIA x2 06/2019 - CKD - DR - mild PN - ED  2.  History of hypogonadism  3. HL  PLAN:  1. Patient with longstanding, previously  uncontrolled type 2 diabetes, on metformin, SGLT2 inhibitor, long-acting insulin and weekly GLP-1 receptor agonist, with improved control in the last 2 years after improving his diet and increasing his HDL change in return dose.  At last visit, he returned after losing approximately 10 pounds, with much improved blood sugars, even dropping in the 60s in the morning.  We decreased his Lantus dose at that time.  She was not checking sugars later in the day and I strongly advised him to do so.  Since last visit, he contacted me about switching from Trulicity to Arab.  He continues on the latter.  He tolerates it well. -He stopped insulin completely since last visit CGM interpretation: -At today's visit, we reviewed his CGM downloads: It appears that 97% of values are in target range (goal >70%), while 3% are higher than 180 (goal <25%), and 0% are lower than 70 (goal <4%).  The calculated average blood sugar is 127.  The projected HbA1c for the next 3 months ([facility]MI) is 6.3%. -Reviewing the CGM trends, it appears that his sugars are excellently controlled except for slightly higher values after breakfast.  Upon questioning, he is eating cereals (Cheerios) with breakfast.  I advised him to add healthy fats to  delay the absorption of the carbs in the cereals (nuts, chia seeds, etc.).  Otherwise, I do not believe he needs a change in regimen.  He does tell me that he is less hungry and feels that he lost a little too much weight on Mounjaro.  He just refilled the 10 mg dose recently but I advised him that if afterwards he prefers to decrease the dose to 7.5 mg weekly, to let me know so I can send a prescription for this. - I suggested to:  Patient Instructions  Please continue: - Metformin 2000 mg with breakfast - Jardiance 10 mg before b'fast - Mounjaro 10 mg weekly  Please return in 6 months.  - we checked his HbA1c: 6.9% (higher than expected from his CGM download) - advised to check sugars at different times of the day - 4x a day, rotating check times - advised for yearly eye exams >> he is UTD - return to clinic in 6 months    2.  History of hypogonadism -Previously on AndroGel, however, this was not covered for 2 years so he came off testosterone supplementation -Reviewed his latest testosterone levels: The free calculated testosterone was normal, at 59, earlier in the year. -I recommended conservative follow-up at that time.  However, since then, he started AndroGel. -This is managed by another physician now.  3. HL -Reviewed latest lipid panel from 09/2020: Fractions: Lab Results  Component Value Date   CHOL 114 09/19/2020   HDL 44 09/19/2020   LDLCALC 55 09/19/2020   TRIG 74 09/19/2020   CHOLHDL 2.6 09/19/2020  -He continues on Crestor 10 mg daily without side effects  Philemon Kingdom, MD PhD [facility] Endocrinology

## 2021-06-01 NOTE — Patient Instructions (Addendum)
Please continue: - Metformin 2000 mg with breakfast - Jardiance 10 mg before b'fast - Mounjaro 10 mg weekly  Please return in 6 months.

## 2021-06-05 ENCOUNTER — Encounter: Payer: Self-pay | Admitting: Physician Assistant

## 2021-06-05 ENCOUNTER — Ambulatory Visit (INDEPENDENT_AMBULATORY_CARE_PROVIDER_SITE_OTHER): Payer: Medicare Other | Admitting: Physician Assistant

## 2021-06-05 VITALS — BP 102/64 | HR 96 | Ht 70.0 in | Wt 184.4 lb

## 2021-06-05 DIAGNOSIS — R197 Diarrhea, unspecified: Secondary | ICD-10-CM | POA: Diagnosis not present

## 2021-06-05 DIAGNOSIS — R143 Flatulence: Secondary | ICD-10-CM

## 2021-06-05 DIAGNOSIS — R140 Abdominal distension (gaseous): Secondary | ICD-10-CM

## 2021-06-05 NOTE — Progress Notes (Signed)
Addendum: Reviewed and agree with assessment and management plan. Rodel Glaspy M, MD  

## 2021-06-05 NOTE — Patient Instructions (Signed)
You have been given a testing kit to check for small intestine bacterial overgrowth (SIBO) which is completed by a company named [facility].   Make sure to return your test in the mail using the return mailing label given to you along with the kit. Your demographic and insurance information have already been sent to the company and they should be in contact with you over the next week regarding this test.   [facility] will collect an upfront charge of $99.74 for commercial insurance plans and $209.74 is you are paying cash. Make sure to discuss with [facility] PRIOR to having the test if they have gotten informatoin from your insurance company as to how much your testing will cost out of pocket, if any. Please keep in mind that you will be getting a call from phone number [phone] or a similar number. If you do not hear from them within this time frame, please call our office at [phone].   Follow up to be determined once SIBO test results come in.  It was great seeing you today! Thank you for entrusting me with your care and choosing [facility].  Ellouise Newer, PA  The [facility] providers would like to encourage you to use [facility] to communicate with providers for non-urgent requests or questions.  Due to long hold times on the telephone, sending your provider a message by [facility] may be faster and more efficient way to get a response. Please allow 48 business hours for a response.  Please remember that this is for non-urgent requests/questions.  If you are age 35 or older, your body mass index should be between 23-30. Your Body mass index is 26.46 kg/m. If this is out of the aforementioned range listed, please consider follow up with your Primary Care Provider.  If you are age 24 or younger, your body mass index should be between 19-25. Your Body mass index is 26.46 kg/m. If this is out of the aformentioned range listed, please consider follow up with your  Primary Care Provider.

## 2021-06-05 NOTE — Progress Notes (Signed)
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 Hilarie Fredrickson, who presents clinic today with a complaint of diarrhea and gas.    08/14/2020 patient seen by Dr. Hilarie Fredrickson for diarrhea/abdominal bloating and flatulence.  At that time GI pathogen panel and fecal leukocytes were negative.  Colonoscopy negative for microscopic colitis.  Patient was given Lomotil 1-2 tabs 4 times a day as needed.    08/24/2020 colonoscopy done for clinically significant diarrhea of unexplained origin with 3 5-8 mm polyps in the cecum, 2 6-12 mm polyps in the ascending colon, one 5 mm polyp in the transverse colon, 2 4-5 mm polyps in the descending colon, diverticulosis in the sigmoid, descending and ascending colon and internal hemorrhoids.  Pathology showed tubular adenomas and no microscopic colitis.  Repeat recommended in 3 years.    Today, the patient tells me that he took 2 rounds of 14 days of Augmentin thinking that his symptoms previously were related to SIBO as suggested by Dr. Hilarie Fredrickson.  Tells me it seemed to improve his symptoms for a couple of weeks or month but then everything seems to come back.  Tells me he is having a lot of gas and belching and feels some uneasiness in his stomach and knows he is about to have an episode of diarrhea.  He would like to have definitive testing for SIBO and treatment if it is positive.    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    Denies fever, chills, weight loss, blood in his stool or abdominal pain.  Past Medical History:  Diagnosis Date   Aftercare following removal/replacement defibrillator    a. defibrillator removed 03/2019.   Arthritis    "thumbs, neck, knees" (06/01/2013)   Cardiomyopathy, dilated, nonischemic ([place])  10/20/2012   Cataract    Chronic systolic heart failure (HCC)    CKD (chronic kidney disease), stage III (HCC)    Coronary artery disease    stent placed   DVT (deep vein thrombosis) in pregnancy    Dysrhythmia    trigemini for 10 years as per pt.   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 03/06/2015   NICM (nonischemic cardiomyopathy) ([place])    Sleep apnea    cpap   Type II diabetes mellitus ([place])    Ventricular tachycardia    observed during stress testing, subsequent VT on EPS   Wears glasses     Past Surgical History:  Procedure Laterality Date   ABLATION  06-01-2013   PVC's ablated along basal inferoseptal RV by Dr Rayann Heman   APPENDECTOMY  1969   CARDIAC CATHETERIZATION  04/2010   CARDIAC DEFIBRILLATOR PLACEMENT  05/09/2010   MDT ICD implanted in [place] by Dr Ky Barban   CATARACT EXTRACTION W/ INTRAOCULAR LENS  IMPLANT, BILATERAL Bilateral    ICD LEAD REMOVAL N/A 03/19/2019   Procedure: ICD LEAD AND GENERATOR REMOVAL;  Surgeon: Evans Lance, MD;  Location: [facility];  Service: Cardiovascular;  Laterality: N/A;   KNEE ARTHROSCOPY Left 1984  LUMBAR LAMINECTOMY  ~ 2004   TONSILLECTOMY AND ADENOIDECTOMY  1972   TOTAL KNEE ARTHROPLASTY Left 04/28/2017   TOTAL KNEE ARTHROPLASTY Left 04/28/2017   Procedure: TOTAL KNEE ARTHROPLASTY LEFT;  Surgeon: Dorna Leitz, MD;  Location: [facility];  Service: Orthopedics;  Laterality: Left;   TOTAL KNEE ARTHROPLASTY Right 05/07/2019   Procedure: TOTAL KNEE ARTHROPLASTY;  Surgeon: Dorna Leitz, MD;  Location: [facility];  Service: Orthopedics;  Laterality: Right;   V-TACH ABLATION N/A 06/01/2013   Procedure: V-TACH ABLATION;  Surgeon: Coralyn Mark, MD;  Location: [facility];  Service: Cardiovascular;  Laterality: N/A;   VASECTOMY      VENTRICULAR ABLATION SURGERY  06/01/2013    Current Outpatient Medications  Medication Sig Dispense Refill   acetaminophen (TYLENOL) 500 MG tablet Take 1,000 mg by mouth every 6 (six) hours as needed for mild pain, moderate pain or headache.      apixaban (ELIQUIS) 5 MG TABS tablet Take 1 tablet by mouth 2 times daily. 180 tablet 1   Blood Glucose Monitoring Suppl (ONETOUCH VERIO) w/Device KIT Use to check blood sugar 3 times a day. 1 kit 0   carvedilol (COREG) 3.125 MG tablet Take 1 tablet (3.125 mg total) by mouth 2 (two) times daily with a meal. 60 tablet 6   clindamycin (CLEOCIN) 300 MG capsule Take 1 capsule (300 mg total) by mouth 4 (four) times daily for 10 days 40 capsule 0   Continuous Blood Gluc Receiver (FREESTYLE LIBRE 2 READER) DEVI Use daily to monitor blood glucose levels. 1 each 0   Continuous Blood Gluc Sensor (FREESTYLE LIBRE 2 SENSOR) MISC Use as directed to monitor blood gluse.  Change every 14 days. 2 each 14   empagliflozin (JARDIANCE) 10 MG TABS tablet TAKE 1 TABLET BY MOUTH ONCE A DAY 90 tablet 3   glucose blood (ONETOUCH VERIO) test strip Use to check blood sugar 3 times a day. 300 each 3   insulin glargine (LANTUS) 100 UNIT/ML Solostar Pen INJECT 24-28 UNITS INTO THE SKIN AT BEDTIME. 45 mL 3   Insulin Pen Needle (UNIFINE PENTIPS) 32G X 4 MM MISC USE TWICE DAILY WITH LANTUS AND VICTOZA 100 each 11   Insulin Pen Needle 32G X 4 MM MISC USE TWICE DAILY WITH LANTUS AND VICTOZA 100 each 11   Lancets MISC Glucose monitor to check blood sugars tid dx code E11.9 brand per insurance coverage 300 each 1   losartan (COZAAR) 25 MG tablet Take 1 tablet (25 mg total) by mouth daily. 90 tablet 3   losartan (COZAAR) 25 MG tablet TAKE 1 TABLET BY MOUTH ONCE A DAY 90 tablet 3   metFORMIN (GLUCOPHAGE-XR) 500 MG 24 hr tablet TAKE 4 TABLETS BY MOUTH DAILY WITH BREAKFAST 360 tablet 1   metFORMIN (GLUCOPHAGE-XR) 500 MG 24 hr tablet TAKE 4 TABLETS BY MOUTH DAILY WITH BREAKFAST 360 tablet 1    PEG-KCl-NaCl-NaSulf-Na Asc-C 140 g SOLR TAKE AS DIRECTED 3 each 0   rosuvastatin (CRESTOR) 10 MG tablet Take 1 tablet (10 mg total) by mouth daily. 90 tablet 3   rosuvastatin (CRESTOR) 10 MG tablet TAKE 1 TABLET BY MOUTH ONCE A DAY 90 tablet 3   spironolactone (ALDACTONE) 25 MG tablet Take 1 tablet (25 mg total) by mouth daily. 90 tablet 3   spironolactone (ALDACTONE) 25 MG tablet TAKE 1 TABLET BY MOUTH ONCE A DAY 90 tablet 3   tamsulosin (FLOMAX) 0.4 MG CAPS capsule Take 0.4 mg by mouth at bedtime.  tamsulosin (FLOMAX) 0.4 MG CAPS capsule Take 1 capsule (0.4 mg total) by mouth daily. 90 capsule 3   Testosterone (ANDROGEL PUMP) 20.25 MG/ACT (1.62%) GEL Apply once per day as directed 75 g 2   Testosterone (ANDROGEL PUMP) 20.25 MG/ACT (1.62%) GEL Apply topically once a day 75 g 2   tirzepatide (MOUNJARO) 10 MG/0.5ML Pen Inject 10 mg into the skin once a week. 6 mL 3   tirzepatide (MOUNJARO) 5 MG/0.5ML Pen Inject 5 mg (0.5 ml) subcutaneously once a week 2 mL 5   No current facility-administered medications for this visit.    Allergies as of 06/05/2021 - Review Complete 06/01/2021  Allergen Reaction Noted   Lisinopril Shortness Of Breath, Swelling, and Other (See Comments) 10/20/2012    Family History  Problem Relation Age of Onset   Alcohol abuse Mother    COPD Mother    Depression Mother    Heart disease Mother    Hypertension Mother    Stroke Mother    COPD Father    Heart disease Father    Hypertension Father    Stroke Father    Cancer Father    Diabetes Sister    Mental retardation Sister    Learning disabilities Sister    Hypertension Sister    Alcohol abuse Brother    Drug abuse Brother    Heart disease Brother    Hypertension Brother    Coronary artery disease Brother    Colon cancer Neg Hx    Pancreatic cancer Neg Hx    Esophageal cancer Neg Hx    Stomach cancer Neg Hx    Liver disease Neg Hx    Rectal cancer Neg Hx     Social History   Socioeconomic  History   Marital status: Married    Spouse name: Not on file   Number of children: Not on file   Years of education: Not on file   Highest education level: Not on file  Occupational History   Occupation: Occupational Medicine Physician  Tobacco Use   Smoking status: Never   Smokeless tobacco: Never  Vaping Use   Vaping Use: Never used  Substance and Sexual Activity   Alcohol use: Yes    Comment: social   Drug use: No   Sexual activity: Yes  Other Topics Concern   Not on file  Social History Narrative   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    Moved here from [facility] [place] 1.5 years ago   Social Determinants of Radio broadcast assistant Strain: Not on file  Food Insecurity: Not on file  Transportation Needs: Not on file  Physical Activity: Not on file  Stress: Not on file  Social Connections: Not on file  Intimate Partner Violence: Not on file    Review of Systems:    Constitutional: No weight loss, fever or chills Cardiovascular: No chest pain   Respiratory: No SOB  Gastrointestinal: See HPI and otherwise negative   Physical Exam:  Vital signs: BP 102/64    Pulse 96    Ht 5' 10" (1.778 m)    Wt 184 lb 6 oz (83.6 kg)    SpO2 99%    BMI 26.46 kg/m    Constitutional:   Pleasant Caucasian male appears to be in NAD, Well developed, Well nourished, alert and cooperative Respiratory: Respirations even and unlabored. Lungs clear to auscultation bilaterally.   No wheezes, crackles, or rhonchi.  Cardiovascular: Normal  S1, S2. No MRG. Regular rate and rhythm. No peripheral edema, cyanosis or pallor.  Gastrointestinal:  Soft, nondistended, nontender. No rebound or guarding. Normal bowel sounds. No appreciable masses or hepatomegaly. Rectal:  Not performed.  Psychiatric: Oriented to person, place and time. Demonstrates good judgement and reason without abnormal affect or behaviors.  See HPI for recent labs and  testing.  Assessment: 1.  Diarrhea: Previous work-up with stool studies and colonoscopy were negative, patient treated with Augmentin for possible SIBO which seemed to help for a while, but symptoms are now back 2.  Bloating and gas: With above  Plan: 1.  Patient would like definitive testing for SIBO.  Have ordered aerodiagnostic testing for him and he was given the kit today in the clinic. 2.  Discussed with patient that ideally if he is positive we would like him treated with Xifaxan. 3.  Patient to follow in clinic per recommendations after testing above.  Ellouise Newer, PA-C [facility] 06/05/2021, 3:01 PM  Cc: No ref. provider found

## 2021-06-26 ENCOUNTER — Other Ambulatory Visit (HOSPITAL_COMMUNITY): Payer: Self-pay

## 2021-06-26 MED ORDER — METFORMIN HCL ER 500 MG PO TB24
ORAL_TABLET | ORAL | 0 refills | Status: DC
  Filled 2021-06-26 (×2): qty 120, 30d supply, fill #0

## 2021-06-26 MED ORDER — EMPAGLIFLOZIN 10 MG PO TABS
ORAL_TABLET | ORAL | 0 refills | Status: DC
  Filled 2021-06-26: qty 30, 30d supply, fill #0

## 2021-06-27 ENCOUNTER — Telehealth: Payer: Self-pay | Admitting: Internal Medicine

## 2021-06-27 ENCOUNTER — Other Ambulatory Visit (HOSPITAL_COMMUNITY): Payer: Self-pay

## 2021-06-27 DIAGNOSIS — E1159 Type 2 diabetes mellitus with other circulatory complications: Secondary | ICD-10-CM

## 2021-06-27 DIAGNOSIS — Z794 Long term (current) use of insulin: Secondary | ICD-10-CM

## 2021-06-27 NOTE — Telephone Encounter (Signed)
Patient requests to be called at ph# [phone] re: Patient states he has new insurance that does not cover Mounjaro and wishes to discuss changing the above listed medication to [employer]

## 2021-06-27 NOTE — Telephone Encounter (Signed)
T, he can try to obtain [facility] with a coupon, monthly, but if he still wants to change to Trulicity,  can you please check with him whether he wants to start at 3 mg or 4.5 mg dose weekly.  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

## 2021-06-28 NOTE — Telephone Encounter (Signed)
Called and left a message for pt to call back and confirm which dose he would like of Trulicity.

## 2021-06-29 ENCOUNTER — Other Ambulatory Visit (HOSPITAL_COMMUNITY): Payer: Self-pay

## 2021-06-29 MED ORDER — TRULICITY 4.5 MG/0.5ML ~~LOC~~ SOAJ
4.5000 mg | SUBCUTANEOUS | 3 refills | Status: DC
  Filled 2021-06-29: qty 2, 28d supply, fill #0

## 2021-06-29 NOTE — Telephone Encounter (Signed)
Pt called back and confirmed 4.5 MG weekly dose. Rx sent to preferred pharmacy.

## 2021-06-29 NOTE — Telephone Encounter (Signed)
Called and left a 2nd message for pt to call back and confirm which dose he would like of Trulicity.

## 2021-06-29 NOTE — Addendum Note (Signed)
Addended by: Lauralyn Primes on: 06/29/2021 03:15 PM   Modules accepted: Orders

## 2021-07-02 ENCOUNTER — Other Ambulatory Visit (HOSPITAL_COMMUNITY): Payer: Self-pay

## 2021-07-04 ENCOUNTER — Telehealth: Payer: Self-pay | Admitting: Pharmacy Technician

## 2021-07-04 ENCOUNTER — Other Ambulatory Visit (HOSPITAL_COMMUNITY): Payer: Self-pay

## 2021-07-04 NOTE — Telephone Encounter (Signed)
Patient Advocate Encounter  Received notification from [facility]) that prior authorization for TRULICITY 4.5MG  is required.   PA submitted on 1.18.23 Key [id number] Status is pending   [facility] will continue to follow  Luciano Cutter, CPhT Patient Advocate [facility] Phone: [phone] Fax:  [phone]

## 2021-07-06 ENCOUNTER — Other Ambulatory Visit (HOSPITAL_COMMUNITY): Payer: Self-pay

## 2021-07-06 NOTE — Telephone Encounter (Signed)
Patient Advocate Encounter  Prior Authorization for Trulicity has been approved.    PA# N/A  Effective dates: 07/04/21 through 07/03/22  Could not get copay since our pharmacy is outside network.  Patient Advocate Fax: [phone]

## 2021-07-11 ENCOUNTER — Other Ambulatory Visit (HOSPITAL_COMMUNITY): Payer: Self-pay

## 2021-07-21 ENCOUNTER — Other Ambulatory Visit: Payer: Self-pay | Admitting: Internal Medicine

## 2021-07-21 ENCOUNTER — Other Ambulatory Visit (HOSPITAL_COMMUNITY): Payer: Self-pay

## 2021-07-21 ENCOUNTER — Other Ambulatory Visit (HOSPITAL_COMMUNITY): Payer: Self-pay | Admitting: Cardiology

## 2021-11-20 DIAGNOSIS — N400 Enlarged prostate without lower urinary tract symptoms: Secondary | ICD-10-CM | POA: Diagnosis not present

## 2021-11-20 DIAGNOSIS — Z79899 Other long term (current) drug therapy: Secondary | ICD-10-CM | POA: Diagnosis not present

## 2021-11-20 DIAGNOSIS — I10 Essential (primary) hypertension: Secondary | ICD-10-CM | POA: Diagnosis not present

## 2021-11-20 DIAGNOSIS — E782 Mixed hyperlipidemia: Secondary | ICD-10-CM | POA: Diagnosis not present

## 2021-11-20 DIAGNOSIS — I509 Heart failure, unspecified: Secondary | ICD-10-CM | POA: Diagnosis not present

## 2021-11-20 DIAGNOSIS — E291 Testicular hypofunction: Secondary | ICD-10-CM | POA: Diagnosis not present

## 2021-11-20 DIAGNOSIS — E1140 Type 2 diabetes mellitus with diabetic neuropathy, unspecified: Secondary | ICD-10-CM | POA: Diagnosis not present

## 2021-11-23 ENCOUNTER — Encounter: Payer: Self-pay | Admitting: Internal Medicine

## 2021-11-23 ENCOUNTER — Ambulatory Visit (HOSPITAL_COMMUNITY)
Admission: RE | Admit: 2021-11-23 | Discharge: 2021-11-23 | Disposition: A | Discharge status: Home / Self Care | Payer: Medicare Other | Source: Ambulatory Visit | Attending: Cardiology | Admitting: Cardiology

## 2021-11-23 ENCOUNTER — Encounter (HOSPITAL_COMMUNITY): Payer: Self-pay | Admitting: Cardiology

## 2021-11-23 ENCOUNTER — Ambulatory Visit (INDEPENDENT_AMBULATORY_CARE_PROVIDER_SITE_OTHER): Payer: BC Managed Care – PPO | Admitting: Internal Medicine

## 2021-11-23 VITALS — BP 112/62 | HR 85 | Wt 174.0 lb

## 2021-11-23 VITALS — BP 112/62 | HR 85 | Ht 70.0 in | Wt 174.4 lb

## 2021-11-23 DIAGNOSIS — E1122 Type 2 diabetes mellitus with diabetic chronic kidney disease: Secondary | ICD-10-CM | POA: Diagnosis not present

## 2021-11-23 DIAGNOSIS — I428 Other cardiomyopathies: Secondary | ICD-10-CM | POA: Diagnosis not present

## 2021-11-23 DIAGNOSIS — Z95810 Presence of automatic (implantable) cardiac defibrillator: Secondary | ICD-10-CM | POA: Diagnosis not present

## 2021-11-23 DIAGNOSIS — I4510 Unspecified right bundle-branch block: Secondary | ICD-10-CM | POA: Insufficient documentation

## 2021-11-23 DIAGNOSIS — Z794 Long term (current) use of insulin: Secondary | ICD-10-CM

## 2021-11-23 DIAGNOSIS — I130 Hypertensive heart and chronic kidney disease with heart failure and stage 1 through stage 4 chronic kidney disease, or unspecified chronic kidney disease: Secondary | ICD-10-CM | POA: Insufficient documentation

## 2021-11-23 DIAGNOSIS — Z79899 Other long term (current) drug therapy: Secondary | ICD-10-CM | POA: Diagnosis not present

## 2021-11-23 DIAGNOSIS — Z7901 Long term (current) use of anticoagulants: Secondary | ICD-10-CM | POA: Diagnosis not present

## 2021-11-23 DIAGNOSIS — I480 Paroxysmal atrial fibrillation: Secondary | ICD-10-CM | POA: Insufficient documentation

## 2021-11-23 DIAGNOSIS — Z86718 Personal history of other venous thrombosis and embolism: Secondary | ICD-10-CM | POA: Insufficient documentation

## 2021-11-23 DIAGNOSIS — I5022 Chronic systolic (congestive) heart failure: Secondary | ICD-10-CM | POA: Diagnosis not present

## 2021-11-23 DIAGNOSIS — N183 Chronic kidney disease, stage 3 unspecified: Secondary | ICD-10-CM | POA: Insufficient documentation

## 2021-11-23 DIAGNOSIS — E1159 Type 2 diabetes mellitus with other circulatory complications: Secondary | ICD-10-CM | POA: Diagnosis not present

## 2021-11-23 DIAGNOSIS — I2510 Atherosclerotic heart disease of native coronary artery without angina pectoris: Secondary | ICD-10-CM | POA: Diagnosis not present

## 2021-11-23 DIAGNOSIS — Z955 Presence of coronary angioplasty implant and graft: Secondary | ICD-10-CM | POA: Insufficient documentation

## 2021-11-23 DIAGNOSIS — E785 Hyperlipidemia, unspecified: Secondary | ICD-10-CM | POA: Diagnosis not present

## 2021-11-23 DIAGNOSIS — I5042 Chronic combined systolic (congestive) and diastolic (congestive) heart failure: Secondary | ICD-10-CM | POA: Insufficient documentation

## 2021-11-23 MED ORDER — EMPAGLIFLOZIN 25 MG PO TABS: 25.0000 mg | ORAL_TABLET | Freq: Every day | ORAL | 3 refills | Status: DC

## 2021-11-23 MED ORDER — FREESTYLE LIBRE 2 SENSOR MISC: 3 refills | Status: DC

## 2021-11-23 MED ORDER — TIRZEPATIDE 5 MG/0.5ML ~~LOC~~ SOAJ: 5.0000 mg | SUBCUTANEOUS | 3 refills | Status: DC

## 2021-11-23 NOTE — Progress Notes (Signed)
Patient ID: CYPRESS FANFAN, male   DOB: [birth year], 76 y.o.   MRN: [id number]    HPI: BODIE ABERNETHY is a 76 y.o.-year-old male, returning for f/u for DM2, dx in 2001, controlled, with complications (non-iCMP - CHF, cerebro-vascular ds - s/p TIA, CKD, mild PN, ED, DR).  Last visit 6 months ago.  Interim history: No increased urination, blurry vision, nausea, chest pain. He had significant weight gain before last visit-11 pounds.  However, since then, he lost 10 pounds despite reducing the dose of Mounjaro since last visit.  He would not want to lose anymore weight. He CGM stopped being covered by his insurance.    DM2: Reviewed HbA1c levels: 11/20/2021: HbA1c 7.2% Lab Results  Component Value Date   HGBA1C 6.9 (A) 06/01/2021   HGBA1C 6.4 (A) 09/12/2020   HGBA1C 6.9 (A) 03/15/2020  09/23/2014: 8.8%  Pt is on a regimen of: - Metformin XR 2000 mg in a.m. - Jardiance 10 mg in a.m. (started 08/2018) >> 25 mg  in am - Mounjaro 2.5 >> 5 >> 10 >> 7.5 mg weekly  He stopped Amaryl before our visit in 02/2019. He was on Lantus before up to 40 units daily, then decrease to 20 units daily.  He came off this in 2022. He was previously on Invokana, but stopped due to increased urination and dehydration. He was previously on Victoza.  Pt checks his sugars >4 times a day with his CGM:  Previously:   Lowest sugar was 56 >> 79 >> 73 >> 69 >> 70s; he has hypoglycemia awareness in the 70s. Highest sugar was 265 >> 165 >> 149 >> 200s.  Glucometer: OneTouch Ultra  Pt's meals are: - Breakfast: cereals + blueberries - Lunch: chinese food or sandwich, fruit - Dinner: meat + veggies or salad - Snacks: no He used Nutrisystem 2x before >> lost 40 lbs.  He is eating one apple for lunch daily.  -+ CKD: Lab Results  Component Value Date   BUN 21 09/19/2020   CREATININE 1.27 (H) 09/19/2020  07/23/2018: 32/1.57, GFR 44, glucose 142 02/19/2017: 19/1.37, GFR 52, glucose 143 09/23/2014: 13/1.1,  GFR >60 02/19/2017: ACR 247 No results found for: "MICRALBCREAT"  He had angioedema with ACE inhibitors.  [name] was not covered.  On losartan 25.  On finerenone.  -+ HL; last set of lipids: Lab Results  Component Value Date   CHOL 114 09/19/2020   HDL 44 09/19/2020   LDLCALC 55 09/19/2020   TRIG 74 09/19/2020   CHOLHDL 2.6 09/19/2020  07/23/2018: 117/135/32/63 02/19/2017: 149/218/30/87  09/2014: 158/157/25/100 On Crestor 10.  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  Dr. Midge Aver and Dr. Zigmund Daniel.  -+ Numbness and tingling in his  feet - stable.  H/o hypogonadism - He was previously on AndroGel but this stopped being covered >> he had to stop the medication with testosterone levels were normal afterwards. - This is now managed by another physician-he is back on AndroGel -started 04/2021.  He feels better on this.  Reviewed available records: Component     Latest Ref Rng & Units 10/03/2020  Testosterone, Serum (Total)     ng/dL 342  % Free Testosterone     % 0.9  Free Testosterone, S     pg/mL 31 (L) -calculated: 59 (normal)  Sex Hormone Binding Globulin     nmol/L 44.4   07/23/2018: Free testosterone 32.7 (6-73) Component     Latest Ref Rng & Units 01/20/2016  Testosterone,Total,LC/MS/MS     250 - 1,100 ng/dL 336  Testosterone, Free     35.0 - 155.0 pg/mL 66.2  Sex Hormone Binding Glob.     22 - 77 nmol/L 27   PSA levels were normal: Lab Results  Component Value Date   PSA 0.9 01/20/2016   PSA 1.50 07/06/2015   PSA 1.16 10/16/2012   In 2022, he started back on gel.  This is prescribed by another physician.  He felt better after he started it.  Patient has a history of trigeminy, status post endocardial ablation at [facility]. He had an epicardial ablation in 08/2014. During that time, he had 2 stents latest, and the ablation procedure was postponed. He is followed by Dr. Koontz.  He has OSA and is on a CPAP.  He has a history of DVT in the left leg,  which is more edematous. He continues on Coreg, Lasix, spironolactone.  He is seen in the CHF clinic. He had 2x TIA 06/2019, 6 weeks post his R TKR. He had an event monitor - no arrhythmia. On Xarelto.  ROS: Neurological: no tremors/+ numbness/+ tingling/no dizziness  I reviewed pt's medications, allergies, PMH, social hx, family hx, and changes were documented in the history of present illness. Otherwise, unchanged from my initial visit note.  Past Medical History:  Diagnosis Date   Aftercare following removal/replacement defibrillator    a. defibrillator removed 03/2019.   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 03/06/2015   NICM (nonischemic cardiomyopathy) (HCC)    Sleep apnea    cpap   Type II diabetes mellitus (HCC)    Ventricular tachycardia    observed during stress testing, subsequent VT on EPS   Wears glasses    Past Surgical History:  Procedure Laterality Date   ABLATION  06-01-2013   PVC's ablated along basal inferoseptal RV by Dr Allred   APPENDECTOMY  1969   CARDIAC CATHETERIZATION  04/2010   CARDIAC DEFIBRILLATOR PLACEMENT  05/09/2010   MDT ICD implanted in [place] by Dr Hudson   CATARACT EXTRACTION W/ INTRAOCULAR LENS  IMPLANT, BILATERAL Bilateral    ICD LEAD REMOVAL N/A 03/19/2019   Procedure: ICD LEAD AND GENERATOR REMOVAL;  Surgeon: Taylor, Gregg W, MD;  Location: [facility] OR;  Service: Cardiovascular;  Laterality:  N/A;   KNEE ARTHROSCOPY Left 1984   LUMBAR LAMINECTOMY  ~ 2004   TONSILLECTOMY AND ADENOIDECTOMY  1972   TOTAL KNEE ARTHROPLASTY Left 04/28/2017   TOTAL KNEE ARTHROPLASTY Left 04/28/2017   Procedure: TOTAL KNEE ARTHROPLASTY LEFT;  Surgeon: Graves, John, MD;  Location: [facility] OR;  Service: Orthopedics;  Laterality: Left;   TOTAL KNEE ARTHROPLASTY Right 05/07/2019   Procedure: TOTAL KNEE ARTHROPLASTY;  Surgeon: Graves, John, MD;  Location: [facility] ORS;  Service: Orthopedics;  Laterality: Right;   V-TACH ABLATION N/A 06/01/2013   Procedure: V-TACH ABLATION;    Surgeon: Coralyn Mark, MD;  Location: [facility] CATH LAB;  Service: Cardiovascular;  Laterality: N/A;   [place] SURGERY  06/01/2013   History   Social History   Marital Status: Married    Spouse Name: N/A   Number of Children: 2   Occupational History   physician   Social History Main Topics   Smoking status: Never Smoker    Smokeless tobacco: Never Used   Alcohol Use: 1.2 oz/week    1 Glasses scotch, rarely   Drug Use: No   Social History Narrative   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    Moved here from [facility] [place] 1.5 years ago   Current Outpatient Medications on File Prior to Visit  Medication Sig Dispense Refill   acetaminophen (TYLENOL) 500 MG tablet Take 1,000 mg by mouth every 6 (six) hours as needed for mild pain, moderate pain or headache.      apixaban (ELIQUIS) 5 MG TABS tablet Take 1 tablet by mouth 2 times daily. 180 tablet 1   Blood Glucose Monitoring Suppl (ONETOUCH VERIO) w/Device KIT Use to check blood sugar 3 times a day. 1 kit 0   carvedilol (COREG) 3.125 MG tablet Take 1 tablet (3.125 mg total) by mouth 2 (two) times daily with a meal. 60 tablet 6   Continuous Blood Gluc Receiver (FREESTYLE LIBRE 2 READER) DEVI Use daily to monitor blood glucose levels. 1 each 0   Continuous Blood Gluc Sensor (FREESTYLE LIBRE 2 SENSOR) MISC Use as  directed to monitor blood gluse.  Change every 14 days. 2 each 14   Dulaglutide (TRULICITY) 4.5 HM/0.9OB SOPN Inject 4.5 mg into the skin once a week. 6 mL 3   glucose blood (ONETOUCH VERIO) test strip Use to check blood sugar 3 times a day. 300 each 3   Lancets MISC Glucose monitor to check blood sugars tid dx code E11.9 brand per insurance coverage 300 each 1   losartan (COZAAR) 25 MG tablet Take 1 tablet (25 mg total) by mouth daily. NEEDS APPOINTMENT FOR ANYMORE REFILLS 30 tablet 0   metFORMIN (GLUCOPHAGE-XR) 500 MG 24 hr tablet TAKE FOUR TABLETS BY MOUTH DAILY WITH BREAKFAST 360 tablet 1   rosuvastatin (CRESTOR) 10 MG tablet TAKE 1 TABLET BY MOUTH ONCE A DAY 90 tablet 3   spironolactone (ALDACTONE) 25 MG tablet Take 1 tablet (25 mg total) by mouth daily. NEEDS APPOINTMENT FOR ANYMORE REFILLS 30 tablet 0   tamsulosin (FLOMAX) 0.4 MG CAPS capsule Take 0.4 mg by mouth at bedtime.      Testosterone (ANDROGEL PUMP) 20.25 MG/ACT (1.62%) GEL Apply once per day as directed 75 g 2   tirzepatide (MOUNJARO) 10 MG/0.5ML Pen Inject 10 mg into the skin once a week. 6 mL 3   No current facility-administered medications on file prior to visit.   Allergies  Allergen Reactions   Lisinopril Shortness Of Breath, Swelling and Other (See Comments)    Angioedema    Family History  Problem Relation Age of Onset   Alcohol abuse Mother    COPD Mother    Depression Mother    Heart disease Mother    Hypertension Mother    Stroke Mother    COPD Father    Heart disease Father    Hypertension Father    Stroke Father    Cancer Father    Diabetes Sister    Mental retardation Sister  Learning disabilities Sister    Hypertension Sister    Alcohol abuse Brother    Drug abuse Brother    Heart disease Brother    Hypertension Brother    Coronary artery disease Brother    Colon cancer Neg Hx    Pancreatic cancer Neg Hx    Esophageal cancer Neg Hx    Stomach cancer Neg Hx    Liver disease Neg Hx     Rectal cancer Neg Hx    PE: BP 112/62 (BP Location: Right Arm, Patient Position: Sitting, Cuff Size: Normal)   Pulse 85   Ht 5' 10" (1.778 m)   Wt 174 lb 6.4 oz (79.1 kg)   SpO2 98%   BMI 25.02 kg/m   Wt Readings from Last 3 Encounters:  11/23/21 174 lb 6.4 oz (79.1 kg)  11/23/21 174 lb (78.9 kg)  06/05/21 184 lb 6 oz (83.6 kg)   Constitutional: overweight, in NAD Eyes: EOMI, no exophthalmos ENT: moist mucous membranes, no thyromegaly, no cervical lymphadenopathy Cardiovascular: RRR, No MRG Respiratory: CTA B Musculoskeletal: no deformities Skin: moist, warm Neurological: no tremor with outstretched hands Diabetic Foot Exam - Simple   Simple Foot Form Diabetic Foot exam was performed with the following findings: Yes 11/23/2021  3:15 PM  Visual Inspection See comments: Yes Sensation Testing See comments: Yes Pulse Check See comments: Yes Comments + B edema + Onychodystrophy B toes + Difficult to palpate pedal pulses + Decreased sensation to monofilament in left foot in the hallux medial and plantar aspects     ASSESSMENT: 1. DM2, insulin-dependent, now controlled, with complications - non-iCMP - CHF - cerebro-vascular ds. - s/p TIA x2 06/2019 - CKD - DR - mild PN - ED  2. HL  3.  History of hypogonadism -Previously on AndroGel, however, this was not covered for 2 years so he came off testosterone supplementation -he started AndroGel. -This is managed by another physician now.  PLAN:  1. Patient with longstanding, previously uncontrolled type 2 diabetes, on metformin, SGLT2 inhibitor, weekly GLP-1/GIP receptor agonist, with improved control in the last 2 years, but worse control since last visit.  He had an HbA1c of 7.2%, increased, on 11/20/2021.  Darcel Bayley was initially denied by the insurance, but now it is covered.  Of note, he was also approved for Trulicity since last visit. CGM interpretation: -At today's visit, we reviewed his CGM downloads up until a month  ago: It appears that 84% of values are in target range (goal >70%), while 16% are higher than 180 (goal <25%), and 0% are lower than 70 (goal <4%).  The calculated average blood sugar is 158.  The projected HbA1c for the next 3 months (GMI) is 7.1%. -Reviewing the CGM trends, sugars appear to be well controlled except for significant high increase after breakfast.  At last visit he had the same trend but with lower values after breakfast.  He was eating cereals (Cheerios) with breakfast.  I advised him to add healthy fats to delay the absorption of the carbs in cereals (nuts, she has seeds etc.).  We did not change his regimen.  At this visit, he tells me he is not adding any of these healthy fats to his breakfast.  We again discussed about trying these and he agrees to do so.  Also, per his request, we will reduce the Mounjaro dose to 5 mg weekly. I did advise him that if the sugars increase afterwards, we may need to start a low-dose Lantus.  She still has this at home. -Since last visit, he increase the Jardiance to 25 mg before breakfast.  We will continue this higher dose.  I refilled his prescription. - I suggested to:  Patient Instructions  Please continue: - Metformin 2000 mg with breakfast - Jardiance 25 mg before b'fast  Please decrease: - Mounjaro 5 mg weekly   For b'fast: - add: chia seeds, ground flax seeds, unsalted nuts or other protein/healthy fat  Please return in 6 months.  - advised to check sugars at different times of the day - 4x a day, rotating check times - advised for yearly eye exams >> he is UTD - foot exam performed today.  He has swelling in bilateral feet and this may be the reason why it is difficult to palpate his dorsalis pedis pulses.  We did discuss about possibly checking ABIs.  He will discuss with his cardiologist about this. - return to clinic in 6 months    2.  HL -Reviewed latest lipid panel from 09/2020: All fractions at goal: Lab Results  Component  Value Date   CHOL 114 09/19/2020   HDL 44 09/19/2020   LDLCALC 55 09/19/2020   TRIG 74 09/19/2020   CHOLHDL 2.6 09/19/2020  -He continues on Crestor 10 mg daily without side effects  Philemon Kingdom, MD PhD [facility] Endocrinology

## 2021-11-23 NOTE — Patient Instructions (Addendum)
Follow up in 6 months please call our office to schedule in November for appt in December.  Changed Lasix to take only as needed for weight gain of 3 pounds overnight or 5 pounds in one week as well swelling in feet or legs.  Your physician has requested that you have an echocardiogram. Echocardiography is a painless test that uses sound waves to create images of your heart. It provides your doctor with information about the size and shape of your heart and how well your heart's chambers and valves are working. This procedure takes approximately one hour. There are no restrictions for this procedure.   If you have any questions or concerns before your next appointment please send [place] a message through [place] or call our office at [phone].    TO LEAVE A MESSAGE FOR THE NURSE SELECT OPTION 2, PLEASE LEAVE A MESSAGE INCLUDING: YOUR NAME DATE OF BIRTH CALL BACK NUMBER REASON FOR CALL**this is important as we prioritize the call backs  YOU WILL RECEIVE A CALL BACK THE SAME DAY AS LONG AS YOU CALL BEFORE 4:00 PM  At the [facility], you and your health needs are our priority. As part of our continuing mission to provide you with exceptional heart care, we have created designated Provider Care Teams. These Care Teams include your primary Cardiologist (physician) and Advanced Practice Providers (APPs- Physician Assistants and Nurse Practitioners) who all work together to provide you with the care you need, when you need it.   You may see any of the following providers on your designated Care Team at your next follow up: Dr Glori Bickers Dr Haynes Kerns, NP Lyda Jester, [place] [facility] [place], [place] Audry Riles, PharmD   Please be sure to bring in all your medications bottles to every appointment.

## 2021-11-23 NOTE — Patient Instructions (Addendum)
Please continue: - Metformin 2000 mg with breakfast - Jardiance 25 mg before b'fast  Please decrease: - Mounjaro 5 mg weekly   For b'fast: - add: chia seeds, ground flax seeds, unsalted nuts or other protein/healthy fat  Please return in 6 months.

## 2021-11-24 NOTE — Progress Notes (Signed)
Date:  11/24/2021   ID:  Paul [name], DOB [birth year], MRN [id number]  Provider location: [facility] Type of Visit: Established patient   PCP:  Patient, No Pcp Per (Inactive)  Cardiologist:  Dr. Aundra [name] EP: Dr. Caryl [name]   History of Present Illness: Paul [name] is a 76 y.o. 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 Patient has had a cardiomyopathy known since around 2011.  Cath at that time showed nonobstructive CAD but EF 25%.  He got a Medtronic ICD.  Initial workup was in [place], and he subsequently moved to [place].  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     He has been noted to have frequent PVCs, at one point up to 1/3 of beats.  VT ablation did not help much, but mexiletine seemed to cut back on PVC frequency.  He is no longer taking mexiletine. Dr Paul [name] felt like his PVCs increased again.  He was seen at [facility] for possible VT ablation.  During that visit, his ICD was interrogated and PVCs were noted to be up to > 1000/hr with short runs NSVT.  Echo was repeated in 1/16 and showed EF down to 30-35%.  Also of note, patient had an untriggered DVT in 12/15 and was on Xarelto for a time but is no longer taking it. He was diagnosed with OSA and is on CPAP.    He had VT mapping at [facility] in 4/16, mapped to inferior septum.  He had LHC the next day, showing 90% distal LAD and 90% PDA.  He had DES to both lesions.  He was sent home on ASA 325, Plavix 75, and Xarelto 20.  Plan was to return 1 year after DES placement to have epicardial VT ablation (off Plavix).     In 6/17, he had unsuccessful epicardial/endocardial VT ablation at [facility]. Suspect mid-myocardial focus.    Echo in 10/18 showed EF 40-45%.  Echo in 3/20 showed EF improved to normal range, 55-60% with normal RV.   Zio patch in 6/20 showed PVCs down to 2.5% of total beats.   Echo in 1/21 showed EF up to 55-60%, normal RV.    In 1/21, he had left-sided weakness with TIA.  Event monitor showed about 2 hrs of atrial fibrillation so anticoagulation was started.   Echo in 7/22 showed EF 50-55%, normal RV, normal IVC size.    He returns for followup of CHF.  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 Weight is down 20 lbs, he is on tirzepatide and also has had small bowel bacterial overgrowth. Symptomatically doing well.  Denies palpitations or syncope.  No lightheadedness.  No significant exertional dyspnea or chest pain.  No orthopnea/PND.  No BRBPR/melena. Generally takes Lasix several times/week when ankles swell.    Labs (10/15): K 4.7, creatinine 1.6, LDL 84, HDL 35 Labs (12/15): K 4.8, creatinine 1.23 Labs (2/16): K 4.5, creatinine 1.4 Labs (8/17): LDL 103 Labs (9/18): LDL 87, HDL 30, TGs 218, K 4.1, creatinine 1.37, hgb 14 Labs (11/18): hgb 11.5, K 4.8, creatinine 1.48 Labs (2/20): K 5.1, creatinine 1.57, hgb 13.5, LDL 63 Labs (8/20): K 4.9, creatinine 1.38, hgb 13.7 Labs (2/21): K 4.2, creatinine 1.43 Labs (6/23): K 4.3, creatinine 1.5, LDL 40, HDL 43, pro-BNP 123  ECG (personally reviewed): NSR, RBBB, LAFB   PMH: 1. Nonischemic cardiomyopathy: Diagnosed in [place] in 2011.  Possibly related to frequent PVCs. Medtronic ICD placed in 2011.  Echo (6/14) with EF 35%.  Echo (10/15) with EF 40-45% (difficult with PVCs), mildly dilated RV with moderately decreased RV systolic function.  Echo (1/16) with EF 30-35%, mildly dilated LV.   - Echo (10/18): EF 40-45%, normal RV size and systolic function.  - Echo (3/20): EF 55-60%, normal RV.  - h/o angioedema with ACEI.  - Echo (1/21): EF 55-60%, normal RV.  - Echo (7/22): EF 50-55%, normal RV, normal IVC size 2. PVCs: Frequent, at one point up to 1/3 of total beats.  Patient had endocardial ablation in 6/14 that was not successful.  However, PVC count dropped with initiation of mexiletine.  He is now off mexiletine.  - Combined epicardial/endocardial  ablation 6/17 at [facility] was unsuccessful, likely due to mid-myocardial focus.  - Holter (7/19): 15% PVCs.  - Zio patch (6/20): 2.5% PVCs, 5.5% PACs, no atrial fibrillation.  3. HTN 4. Type II diabetes 5. CAD:  LHC (1/11) with 40-50% mLAD stenosis, 90% small OM1 stenosis, EF 25%.  LHC (4/16) witih 90% dLAD and 90% PDA, both treated with DES.  6. GERD 7. Appendectomy 8. Lumbar laminectomy 9. CKD 10. Hyperlipidemia: Myalgias with Crestor.  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  12. OSA:  CPAP 13. CKD: Stage 3.  14. TIA 1/21 15. Atrial fibrillation: Paroxysmal.  16. Small bowel bacterial overgrowth  Current Outpatient Medications  Medication Sig Dispense Refill   acetaminophen (TYLENOL) 500 MG tablet Take 1,000 mg by mouth every 6 (six) hours as needed for mild pain, moderate pain or headache.      apixaban (ELIQUIS) 5 MG TABS tablet Take 1 tablet by mouth 2 times daily. 180 tablet 1   Blood Glucose Monitoring Suppl (ONETOUCH VERIO) w/Device KIT Use to check blood sugar 3 times a day. 1 kit 0   carvedilol (COREG) 3.125 MG tablet Take 1 tablet (3.125 mg total) by mouth 2 (two) times daily with a meal. 60 tablet 6   Continuous Blood Gluc Receiver (FREESTYLE LIBRE 2 READER) DEVI Use daily to monitor blood glucose levels. 1 each 0   furosemide (LASIX) 40 MG tablet Take 20 mg by mouth as needed. Take for weight gain of more 3 lbs overnight or 5 lbs in 1 week. Swelling in feet or legs.     glucose blood (ONETOUCH VERIO) test strip Use to check blood sugar 3 times a day. 300 each 3   Lancets MISC Glucose monitor to check blood sugars tid dx code E11.9 brand per insurance coverage 300 each 1   losartan (COZAAR) 25 MG tablet Take 1 tablet (25 mg total) by mouth daily. NEEDS APPOINTMENT FOR ANYMORE REFILLS 30 tablet 0   metFORMIN (GLUCOPHAGE-XR) 500 MG 24 hr tablet TAKE FOUR TABLETS BY MOUTH DAILY WITH BREAKFAST 360 tablet 1   rosuvastatin (CRESTOR) 10 MG tablet TAKE 1 TABLET BY MOUTH ONCE A DAY 90 tablet 3    spironolactone (ALDACTONE) 25 MG tablet Take 1 tablet (25 mg total) by mouth daily. NEEDS APPOINTMENT FOR ANYMORE REFILLS 30 tablet 0   tamsulosin (FLOMAX) 0.4 MG CAPS capsule Take 0.4 mg by mouth at bedtime.      Continuous Blood Gluc Sensor (FREESTYLE LIBRE 2 SENSOR) MISC Use as directed to monitor blood gluse.  Change every 14 days. 6 each 3   empagliflozin (JARDIANCE) 25 MG TABS tablet Take 1 tablet (25 mg total) by mouth daily. 90 tablet 3   Testosterone (ANDROGEL PUMP) 20.25 MG/ACT (1.62%) GEL  Apply once per day as directed (Patient not taking: Reported on 11/23/2021) 75 g 2   tirzepatide (MOUNJARO) 5 MG/0.5ML Pen Inject 5 mg into the skin once a week. 6 mL 3   No current facility-administered medications for this encounter.    Allergies:   Lisinopril   Social History:  The patient  reports that he has never smoked. He has never used smokeless tobacco. He reports current alcohol use. He reports that he does not use drugs.   Family History:  The patient's family history includes Alcohol abuse in his brother and mother; COPD in his father and mother; Cancer in his father; Coronary artery disease in his brother; Depression in his mother; Diabetes in his sister; Drug abuse in his brother; Heart disease in his brother, father, and mother; Hypertension in his brother, father, mother, and sister; Learning disabilities in his sister; Mental retardation in his sister; Stroke in his father and mother.   ROS:  Please see the history of present illness.   All other systems are personally reviewed and negative.   Exam:   BP 112/62   Pulse 85   Wt 78.9 kg (174 lb)   SpO2 98%   BMI 24.97 kg/m  General: NAD Neck: No JVD, no thyromegaly or thyroid nodule.  Lungs: Clear to auscultation bilaterally with normal respiratory effort. CV: Nondisplaced PMI.  Heart regular S1/S2, no S3/S4, no murmur.  No peripheral edema.  No carotid bruit.  Normal pedal pulses.  Abdomen: Soft, nontender, no  hepatosplenomegaly, no distention.  Skin: Intact without lesions or rashes.  Neurologic: Alert and oriented x 3.  Psych: Normal affect. Extremities: No clubbing or cyanosis.  HEENT: Normal.   Recent Labs: No results found for requested labs within last 365 days.  Personally reviewed   Wt Readings from Last 3 Encounters:  11/23/21 78.9 kg (174 lb)  11/23/21 79.1 kg (174 lb 6.4 oz)  06/05/21 83.6 kg (184 lb 6 oz)     ASSESSMENT AND PLAN:  1. 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  St Jude ICD placed and later removed with improved EF.  Possibly PVC-related as there seemed to have been improvement in EF with decreasing PVC burden (2.5% on last Zio patch monitor in 6/20). He had PCI in 4/16 to distal LAD and PDA, but suspect that his cardiomyopathy is not predominantly ischemic (pre-dated coronary disease and out of proportion to coronary disease).  Echo in 10/18 showed EF back to 40-45%. Echo in 1/21 showed EF up to normal range, 55-60% with normal RV.  Echo in 7/22 with EF 50-55%, normal RV.  NYHA class I-II symptoms, not volume overloaded on exam.  - He is off Entresto, apparently he had angioedema with lisinopril in the past so should not restart Entresto given significant risk of cross-reactivity.  ARBs do not have a high risk of angioedema despite angioedema with ACEI and generally are well-tolerated with close followup.  Continue losartan 25 mg daily.  - Continue spironolactone 25 mg daily.   - Continue Coreg 3.125 mg bid.  - Continue empagliflozin.  - He has had his ICD removed with improved EF.  - I will arrange for repeat echo in 7/23.  - With higher BUN and creatinine, would try to avoid using Lasix.  2. 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 Epicardial/endocardial ablation in 6/17 at [facility] was unsuccessful, likely due to mid-myocardial focus.  However, Zio patch monitor in 6/20 showed PVCs down to 2.5% of total beats. 3. CKD:  Stage 3, BUN  and  creatinine remain mildly elevated.   - Should avoid all NSAIDs.   - Would avoid taking frequent Lasix, do not think he has significant volume overload.  4. CAD: Nonobstructive CAD on 2011 cath but 4/16 cath at [facility] with distal LAD and PDA stenoses treated with DES.  As above, do not think coronary disease can explain extent of his prior cardiomyopathy.  - No ASA given use of apixaban.   - Continue Crestor 10 mg daily, good lipids in 6/23.  5. DVT: Spontaneous (no trigger).   - He is on apixaban for AF.  6. Type II diabetes: He sees Dr. Cruzita Lederer for endocrinology.  He is on empagliflozin. 7. Atrial fibrillation: Paroxysmal.  He does not feel.  Associated with 1/21 TIA.  NSR today.  - Continue apixaban 5 mg bid.    Followup in 6 months if echo shows normal EF.  BMET at 3 months.    Signed, Loralie Champagne, MD  11/24/2021  [facility] [address] Heart and [place] [address] [phone] (office) [phone] (fax)

## 2021-11-27 ENCOUNTER — Other Ambulatory Visit (HOSPITAL_COMMUNITY): Payer: Self-pay | Admitting: Cardiology

## 2021-12-06 ENCOUNTER — Other Ambulatory Visit (HOSPITAL_COMMUNITY): Payer: Medicare Other

## 2021-12-10 ENCOUNTER — Ambulatory Visit (HOSPITAL_COMMUNITY): Payer: Medicare Other

## 2021-12-24 ENCOUNTER — Ambulatory Visit (HOSPITAL_COMMUNITY)
Admission: RE | Admit: 2021-12-24 | Discharge: 2021-12-24 | Disposition: A | Discharge status: Home / Self Care | Payer: BC Managed Care – PPO | Source: Ambulatory Visit | Attending: Cardiology | Admitting: Cardiology

## 2021-12-24 DIAGNOSIS — I5022 Chronic systolic (congestive) heart failure: Secondary | ICD-10-CM | POA: Insufficient documentation

## 2021-12-24 DIAGNOSIS — I110 Hypertensive heart disease with heart failure: Secondary | ICD-10-CM | POA: Insufficient documentation

## 2021-12-24 LAB — ECHOCARDIOGRAM COMPLETE
Area-P 1/2: 2.76 cm2
Calc EF: 48.6 %
P 1/2 time: 469 ms
S' Lateral: 3.5 cm
Single Plane A2C EF: 51.6 %
Single Plane A4C EF: 45.4 %

## 2021-12-24 NOTE — Progress Notes (Signed)
  Echocardiogram 2D Echocardiogram has been performed.  Johny Chess 12/24/2021, 8:36 AM

## 2022-01-09 ENCOUNTER — Telehealth: Payer: Self-pay | Admitting: Licensed Clinical Social Worker

## 2022-01-09 NOTE — Patient Outreach (Signed)
  Care Management  Outreach Note  01/09/2022 Name: Paul [name] MRN: [id number] DOB: 20-Oct-1945  An unsuccessful telephone outreach was attempted today. The patient was referred to the case management team for assistance with care management and care coordination.   Follow Up Plan:  The care management team will reach out to the patient again over the next 5 to 7 days.   Casimer Lanius, [place] [phone]

## 2022-01-16 ENCOUNTER — Ambulatory Visit: Payer: Self-pay | Admitting: Licensed Clinical Social Worker

## 2022-01-16 NOTE — Patient Outreach (Signed)
  Care Coordination   Initial Visit Note   01/16/2022 Name: Paul [name] MRN: [id number] DOB: 20-Mar-1946  Paul [name] is a 76 y.o. year old male who sees Patient, No Pcp Per for primary care. I spoke with  Paul [name] by phone today  What matters to the patients health and wellness today?  Not interested in care coordination     Goals Addressed   None     SDOH assessments and interventions completed:  No     Care Coordination Interventions Activated:  No  Care Coordination Interventions:  No, not indicated   Follow up plan: No further intervention required.   Encounter Outcome:  [name], Paul [name] [phone]

## 2022-03-01 ENCOUNTER — Encounter (INDEPENDENT_AMBULATORY_CARE_PROVIDER_SITE_OTHER): Payer: TRICARE For Life (TFL) | Admitting: Ophthalmology

## 2022-03-04 ENCOUNTER — Encounter (INDEPENDENT_AMBULATORY_CARE_PROVIDER_SITE_OTHER): Payer: Medicare Other | Admitting: Ophthalmology

## 2022-03-27 DIAGNOSIS — Z111 Encounter for screening for respiratory tuberculosis: Secondary | ICD-10-CM | POA: Diagnosis not present

## 2022-03-27 DIAGNOSIS — Z23 Encounter for immunization: Secondary | ICD-10-CM | POA: Diagnosis not present

## 2022-04-02 DIAGNOSIS — E7849 Other hyperlipidemia: Secondary | ICD-10-CM | POA: Diagnosis not present

## 2022-04-02 DIAGNOSIS — I2510 Atherosclerotic heart disease of native coronary artery without angina pectoris: Secondary | ICD-10-CM | POA: Diagnosis not present

## 2022-04-02 DIAGNOSIS — N400 Enlarged prostate without lower urinary tract symptoms: Secondary | ICD-10-CM | POA: Diagnosis not present

## 2022-04-02 DIAGNOSIS — I10 Essential (primary) hypertension: Secondary | ICD-10-CM | POA: Diagnosis not present

## 2022-05-31 ENCOUNTER — Ambulatory Visit: Payer: Medicare Other | Admitting: Internal Medicine

## 2022-08-04 DIAGNOSIS — Z7901 Long term (current) use of anticoagulants: Secondary | ICD-10-CM | POA: Diagnosis not present

## 2022-08-04 DIAGNOSIS — S0990XA Unspecified injury of head, initial encounter: Secondary | ICD-10-CM | POA: Diagnosis not present

## 2022-08-04 DIAGNOSIS — S066XAA Traumatic subarachnoid hemorrhage with loss of consciousness status unknown, initial encounter: Secondary | ICD-10-CM | POA: Diagnosis not present

## 2022-08-04 DIAGNOSIS — R2681 Unsteadiness on feet: Secondary | ICD-10-CM | POA: Diagnosis not present

## 2022-08-04 DIAGNOSIS — I6200 Nontraumatic subdural hemorrhage, unspecified: Secondary | ICD-10-CM | POA: Diagnosis not present

## 2022-08-04 DIAGNOSIS — Z79899 Other long term (current) drug therapy: Secondary | ICD-10-CM | POA: Diagnosis not present

## 2022-08-04 DIAGNOSIS — R4182 Altered mental status, unspecified: Secondary | ICD-10-CM | POA: Diagnosis not present

## 2022-08-04 DIAGNOSIS — E119 Type 2 diabetes mellitus without complications: Secondary | ICD-10-CM | POA: Diagnosis not present

## 2022-08-04 DIAGNOSIS — Z8673 Personal history of transient ischemic attack (TIA), and cerebral infarction without residual deficits: Secondary | ICD-10-CM | POA: Diagnosis not present

## 2022-08-04 DIAGNOSIS — S065XAA Traumatic subdural hemorrhage with loss of consciousness status unknown, initial encounter: Secondary | ICD-10-CM | POA: Diagnosis not present

## 2022-08-04 DIAGNOSIS — Z7985 Long-term (current) use of injectable non-insulin antidiabetic drugs: Secondary | ICD-10-CM | POA: Diagnosis not present

## 2022-08-04 DIAGNOSIS — Z7984 Long term (current) use of oral hypoglycemic drugs: Secondary | ICD-10-CM | POA: Diagnosis not present

## 2022-08-04 DIAGNOSIS — I10 Essential (primary) hypertension: Secondary | ICD-10-CM | POA: Diagnosis present

## 2022-08-04 DIAGNOSIS — E785 Hyperlipidemia, unspecified: Secondary | ICD-10-CM | POA: Diagnosis present

## 2022-08-04 DIAGNOSIS — Z9989 Dependence on other enabling machines and devices: Secondary | ICD-10-CM | POA: Diagnosis not present

## 2022-08-04 DIAGNOSIS — Z043 Encounter for examination and observation following other accident: Secondary | ICD-10-CM | POA: Diagnosis not present

## 2022-08-04 DIAGNOSIS — S065X0A Traumatic subdural hemorrhage without loss of consciousness, initial encounter: Secondary | ICD-10-CM | POA: Diagnosis not present

## 2022-08-04 DIAGNOSIS — Z794 Long term (current) use of insulin: Secondary | ICD-10-CM | POA: Diagnosis not present

## 2022-08-04 DIAGNOSIS — W19XXXA Unspecified fall, initial encounter: Secondary | ICD-10-CM | POA: Diagnosis not present

## 2022-08-04 DIAGNOSIS — I480 Paroxysmal atrial fibrillation: Secondary | ICD-10-CM | POA: Diagnosis not present

## 2022-08-04 DIAGNOSIS — S301XXA Contusion of abdominal wall, initial encounter: Secondary | ICD-10-CM | POA: Diagnosis not present

## 2022-08-04 DIAGNOSIS — R188 Other ascites: Secondary | ICD-10-CM | POA: Diagnosis not present

## 2022-08-05 DIAGNOSIS — S065XAA Traumatic subdural hemorrhage with loss of consciousness status unknown, initial encounter: Secondary | ICD-10-CM | POA: Diagnosis not present

## 2022-08-16 ENCOUNTER — Encounter (HOSPITAL_COMMUNITY): Payer: BC Managed Care – PPO | Admitting: Cardiology

## 2022-08-23 DIAGNOSIS — N281 Cyst of kidney, acquired: Secondary | ICD-10-CM | POA: Diagnosis not present

## 2022-08-23 DIAGNOSIS — R93429 Abnormal radiologic findings on diagnostic imaging of unspecified kidney: Secondary | ICD-10-CM | POA: Diagnosis not present

## 2023-01-05 ENCOUNTER — Other Ambulatory Visit: Payer: Self-pay | Admitting: Internal Medicine

## 2023-01-17 DIAGNOSIS — I429 Cardiomyopathy, unspecified: Secondary | ICD-10-CM | POA: Diagnosis not present

## 2023-01-17 DIAGNOSIS — Z794 Long term (current) use of insulin: Secondary | ICD-10-CM | POA: Diagnosis not present

## 2023-01-17 DIAGNOSIS — Z8679 Personal history of other diseases of the circulatory system: Secondary | ICD-10-CM | POA: Insufficient documentation

## 2023-01-17 DIAGNOSIS — E119 Type 2 diabetes mellitus without complications: Secondary | ICD-10-CM | POA: Diagnosis not present

## 2023-01-17 DIAGNOSIS — E7849 Other hyperlipidemia: Secondary | ICD-10-CM | POA: Diagnosis not present

## 2023-01-17 DIAGNOSIS — I2510 Atherosclerotic heart disease of native coronary artery without angina pectoris: Secondary | ICD-10-CM | POA: Diagnosis not present

## 2023-01-17 DIAGNOSIS — Z125 Encounter for screening for malignant neoplasm of prostate: Secondary | ICD-10-CM | POA: Diagnosis not present

## 2023-01-17 DIAGNOSIS — I10 Essential (primary) hypertension: Secondary | ICD-10-CM | POA: Diagnosis not present

## 2023-02-04 DIAGNOSIS — S065XAA Traumatic subdural hemorrhage with loss of consciousness status unknown, initial encounter: Secondary | ICD-10-CM | POA: Diagnosis not present

## 2023-02-04 DIAGNOSIS — S069X9S Unspecified intracranial injury with loss of consciousness of unspecified duration, sequela: Secondary | ICD-10-CM | POA: Diagnosis not present

## 2023-02-04 DIAGNOSIS — G629 Polyneuropathy, unspecified: Secondary | ICD-10-CM | POA: Diagnosis not present

## 2023-02-04 DIAGNOSIS — G5603 Carpal tunnel syndrome, bilateral upper limbs: Secondary | ICD-10-CM | POA: Diagnosis not present

## 2023-03-04 DIAGNOSIS — N1832 Chronic kidney disease, stage 3b: Secondary | ICD-10-CM | POA: Insufficient documentation

## 2023-03-04 DIAGNOSIS — R531 Weakness: Secondary | ICD-10-CM | POA: Diagnosis not present

## 2023-03-04 DIAGNOSIS — R0602 Shortness of breath: Secondary | ICD-10-CM | POA: Diagnosis not present

## 2023-03-04 DIAGNOSIS — I351 Nonrheumatic aortic (valve) insufficiency: Secondary | ICD-10-CM | POA: Diagnosis not present

## 2023-03-04 DIAGNOSIS — I491 Atrial premature depolarization: Secondary | ICD-10-CM | POA: Diagnosis not present

## 2023-03-04 DIAGNOSIS — R600 Localized edema: Secondary | ICD-10-CM | POA: Diagnosis not present

## 2023-03-04 DIAGNOSIS — I361 Nonrheumatic tricuspid (valve) insufficiency: Secondary | ICD-10-CM | POA: Diagnosis not present

## 2023-03-04 DIAGNOSIS — I499 Cardiac arrhythmia, unspecified: Secondary | ICD-10-CM | POA: Diagnosis not present

## 2023-03-04 DIAGNOSIS — I4510 Unspecified right bundle-branch block: Secondary | ICD-10-CM | POA: Diagnosis not present

## 2023-03-04 DIAGNOSIS — I5020 Unspecified systolic (congestive) heart failure: Secondary | ICD-10-CM | POA: Insufficient documentation

## 2023-03-05 DIAGNOSIS — D469 Myelodysplastic syndrome, unspecified: Secondary | ICD-10-CM | POA: Insufficient documentation

## 2023-03-06 DIAGNOSIS — D72829 Elevated white blood cell count, unspecified: Secondary | ICD-10-CM | POA: Diagnosis not present

## 2023-03-06 DIAGNOSIS — Z7901 Long term (current) use of anticoagulants: Secondary | ICD-10-CM | POA: Diagnosis not present

## 2023-03-06 DIAGNOSIS — R0609 Other forms of dyspnea: Secondary | ICD-10-CM | POA: Diagnosis not present

## 2023-03-06 DIAGNOSIS — R5383 Other fatigue: Secondary | ICD-10-CM | POA: Diagnosis not present

## 2023-03-06 DIAGNOSIS — I951 Orthostatic hypotension: Secondary | ICD-10-CM | POA: Diagnosis not present

## 2023-03-06 DIAGNOSIS — I493 Ventricular premature depolarization: Secondary | ICD-10-CM | POA: Diagnosis not present

## 2023-03-06 DIAGNOSIS — D696 Thrombocytopenia, unspecified: Secondary | ICD-10-CM | POA: Diagnosis not present

## 2023-03-06 DIAGNOSIS — D469 Myelodysplastic syndrome, unspecified: Secondary | ICD-10-CM | POA: Diagnosis not present

## 2023-03-06 DIAGNOSIS — G901 Familial dysautonomia [Riley-Day]: Secondary | ICD-10-CM | POA: Diagnosis not present

## 2023-03-06 DIAGNOSIS — I2510 Atherosclerotic heart disease of native coronary artery without angina pectoris: Secondary | ICD-10-CM | POA: Diagnosis not present

## 2023-03-06 DIAGNOSIS — D649 Anemia, unspecified: Secondary | ICD-10-CM | POA: Diagnosis not present

## 2023-03-06 DIAGNOSIS — I480 Paroxysmal atrial fibrillation: Secondary | ICD-10-CM | POA: Diagnosis not present

## 2023-03-06 DIAGNOSIS — N1832 Chronic kidney disease, stage 3b: Secondary | ICD-10-CM | POA: Diagnosis not present

## 2023-03-06 DIAGNOSIS — R600 Localized edema: Secondary | ICD-10-CM | POA: Diagnosis not present

## 2023-03-10 ENCOUNTER — Encounter (HOSPITAL_COMMUNITY): Payer: Self-pay | Admitting: Cardiology

## 2023-03-13 DIAGNOSIS — C9200 Acute myeloblastic leukemia, not having achieved remission: Secondary | ICD-10-CM | POA: Diagnosis not present

## 2023-03-20 DIAGNOSIS — Z7984 Long term (current) use of oral hypoglycemic drugs: Secondary | ICD-10-CM | POA: Diagnosis not present

## 2023-03-20 DIAGNOSIS — I129 Hypertensive chronic kidney disease with stage 1 through stage 4 chronic kidney disease, or unspecified chronic kidney disease: Secondary | ICD-10-CM | POA: Diagnosis not present

## 2023-03-20 DIAGNOSIS — N183 Chronic kidney disease, stage 3 unspecified: Secondary | ICD-10-CM | POA: Diagnosis not present

## 2023-03-20 DIAGNOSIS — I2510 Atherosclerotic heart disease of native coronary artery without angina pectoris: Secondary | ICD-10-CM | POA: Diagnosis not present

## 2023-03-20 DIAGNOSIS — Z79899 Other long term (current) drug therapy: Secondary | ICD-10-CM | POA: Diagnosis not present

## 2023-03-20 DIAGNOSIS — Z794 Long term (current) use of insulin: Secondary | ICD-10-CM | POA: Diagnosis not present

## 2023-03-20 DIAGNOSIS — I4891 Unspecified atrial fibrillation: Secondary | ICD-10-CM | POA: Diagnosis not present

## 2023-03-20 DIAGNOSIS — C946 Myelodysplastic disease, not classified: Secondary | ICD-10-CM | POA: Diagnosis not present

## 2023-03-20 DIAGNOSIS — E1122 Type 2 diabetes mellitus with diabetic chronic kidney disease: Secondary | ICD-10-CM | POA: Diagnosis not present

## 2023-03-21 ENCOUNTER — Ambulatory Visit (HOSPITAL_COMMUNITY)
Admission: RE | Admit: 2023-03-21 | Discharge: 2023-03-21 | Disposition: A | Discharge status: Home / Self Care | Payer: BC Managed Care – PPO | Source: Ambulatory Visit | Attending: Internal Medicine | Admitting: Internal Medicine

## 2023-03-21 ENCOUNTER — Encounter (HOSPITAL_COMMUNITY): Payer: Self-pay | Admitting: Internal Medicine

## 2023-03-21 VITALS — BP 110/68 | HR 76 | Wt 184.6 lb

## 2023-03-21 DIAGNOSIS — Z7985 Long-term (current) use of injectable non-insulin antidiabetic drugs: Secondary | ICD-10-CM | POA: Diagnosis not present

## 2023-03-21 DIAGNOSIS — Z955 Presence of coronary angioplasty implant and graft: Secondary | ICD-10-CM | POA: Insufficient documentation

## 2023-03-21 DIAGNOSIS — Z79899 Other long term (current) drug therapy: Secondary | ICD-10-CM | POA: Diagnosis not present

## 2023-03-21 DIAGNOSIS — I5032 Chronic diastolic (congestive) heart failure: Secondary | ICD-10-CM | POA: Diagnosis not present

## 2023-03-21 DIAGNOSIS — I480 Paroxysmal atrial fibrillation: Secondary | ICD-10-CM | POA: Insufficient documentation

## 2023-03-21 DIAGNOSIS — Z7901 Long term (current) use of anticoagulants: Secondary | ICD-10-CM | POA: Diagnosis not present

## 2023-03-21 DIAGNOSIS — E785 Hyperlipidemia, unspecified: Secondary | ICD-10-CM | POA: Insufficient documentation

## 2023-03-21 DIAGNOSIS — N183 Chronic kidney disease, stage 3 unspecified: Secondary | ICD-10-CM | POA: Insufficient documentation

## 2023-03-21 DIAGNOSIS — Z95810 Presence of automatic (implantable) cardiac defibrillator: Secondary | ICD-10-CM | POA: Diagnosis not present

## 2023-03-21 DIAGNOSIS — I493 Ventricular premature depolarization: Secondary | ICD-10-CM | POA: Diagnosis not present

## 2023-03-21 DIAGNOSIS — I5022 Chronic systolic (congestive) heart failure: Secondary | ICD-10-CM

## 2023-03-21 DIAGNOSIS — I2510 Atherosclerotic heart disease of native coronary artery without angina pectoris: Secondary | ICD-10-CM | POA: Diagnosis not present

## 2023-03-21 DIAGNOSIS — G4733 Obstructive sleep apnea (adult) (pediatric): Secondary | ICD-10-CM | POA: Insufficient documentation

## 2023-03-21 DIAGNOSIS — I428 Other cardiomyopathies: Secondary | ICD-10-CM | POA: Insufficient documentation

## 2023-03-21 DIAGNOSIS — K219 Gastro-esophageal reflux disease without esophagitis: Secondary | ICD-10-CM | POA: Diagnosis not present

## 2023-03-21 DIAGNOSIS — Z8673 Personal history of transient ischemic attack (TIA), and cerebral infarction without residual deficits: Secondary | ICD-10-CM | POA: Insufficient documentation

## 2023-03-21 DIAGNOSIS — I130 Hypertensive heart and chronic kidney disease with heart failure and stage 1 through stage 4 chronic kidney disease, or unspecified chronic kidney disease: Secondary | ICD-10-CM | POA: Insufficient documentation

## 2023-03-21 DIAGNOSIS — I5042 Chronic combined systolic (congestive) and diastolic (congestive) heart failure: Secondary | ICD-10-CM | POA: Diagnosis not present

## 2023-03-21 DIAGNOSIS — Z86718 Personal history of other venous thrombosis and embolism: Secondary | ICD-10-CM | POA: Diagnosis not present

## 2023-03-21 DIAGNOSIS — E1122 Type 2 diabetes mellitus with diabetic chronic kidney disease: Secondary | ICD-10-CM | POA: Diagnosis not present

## 2023-03-21 MED ORDER — EMPAGLIFLOZIN 10 MG PO TABS: 10.0000 mg | ORAL_TABLET | Freq: Every day | ORAL | 11 refills | Status: DC

## 2023-03-21 NOTE — Patient Instructions (Addendum)
Good to see you today!  RESTART Jardiance 10 mg daily  Your physician has requested that you have an echocardiogram. Echocardiography is a painless test that uses sound waves to create images of your heart. It provides your doctor with information about the size and shape of your heart and how well your heart's chambers and valves are working. This procedure takes approximately one hour. There are no restrictions for this procedure. Please do NOT wear cologne, perfume, aftershave, or lotions (deodorant is allowed). Please arrive 15 minutes prior to your appointment time.  Your physician recommends that you schedule a follow-up appointment in: 12 months with echocardiogram(October 2025) Call office in August 2025  to schedule an appointment   If you have any questions or concerns before your next appointment please send [place] a message through [place] or call our office at [phone].    TO LEAVE A MESSAGE FOR THE NURSE SELECT OPTION 2, PLEASE LEAVE A MESSAGE INCLUDING: YOUR NAME DATE OF BIRTH CALL BACK NUMBER REASON FOR CALL**this is important as we prioritize the call backs  YOU WILL RECEIVE A CALL BACK THE SAME DAY AS LONG AS YOU CALL BEFORE 4:00 PM  At the [facility], you and your health needs are our priority. As part of our continuing mission to provide you with exceptional heart care, we have created designated Provider Care Teams. These Care Teams include your primary Cardiologist (physician) and Advanced Practice Providers (APPs- Physician Assistants and Nurse Practitioners) who all work together to provide you with the care you need, when you need it.   You may see any of the following providers on your designated Care Team at your next follow up: Dr Arvilla Meres Dr Marca Ancona Dr. Dorthula Nettles Dr. Clearnce Hasten Amy Filbert Schilder, NP Robbie Lis, [place] [facility] [place], [place] Brynda Peon, NP [place] Lee, NP Karle Plumber, PharmD   Please  be sure to bring in all your medications bottles to every appointment.    Thank you for choosing [facility]

## 2023-03-21 NOTE — Addendum Note (Signed)
Encounter addended by: Suezanne Cheshire, RN on: 03/21/2023 2:34 PM  Actions taken: Order Reconciliation Section accessed

## 2023-03-21 NOTE — Addendum Note (Signed)
Encounter addended by: Suezanne Cheshire, RN on: 03/21/2023 2:32 PM  Actions taken: Order list changed, Diagnosis association updated, Clinical Note Signed

## 2023-03-21 NOTE — Progress Notes (Signed)
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 Shirlee Latch EP: Dr. 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 Patient has had a cardiomyopathy known since around 2011.  Cath at that time showed nonobstructive CAD but EF 25%.  He got a Medtronic ICD.  Initial workup was in [place], and he subsequently moved to [place].  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     He has been noted to have frequent PVCs, at one point up to 1/3 of beats.  VT ablation did not help much, but mexiletine seemed to cut back on PVC frequency.  He is no longer taking mexiletine. Dr Dareen Piano felt like his PVCs increased again.  He was seen at [facility] for possible VT ablation.  During that visit, his ICD was interrogated and PVCs were noted to be up to > 1000/hr with short runs NSVT.  Echo was repeated in 1/16 and showed EF down to 30-35%.  Also of note, patient had an untriggered DVT in 12/15 and was on Xarelto for a time but is no longer taking it. He was diagnosed with OSA and is on CPAP.    He had VT mapping at [facility] in 4/16, mapped to inferior septum.  He had LHC the next day, showing 90% distal LAD and 90% PDA.  He had DES to both lesions.  He was sent home on ASA 325, Plavix 75, and Xarelto 20.  Plan was to return 1 year after DES placement to have epicardial VT ablation (off Plavix).     In 6/17, he had unsuccessful epicardial/endocardial VT ablation at [facility]. Suspect mid-myocardial focus.    Echo in 10/18 showed EF 40-45%.  Echo in 3/20 showed EF improved to normal range, 55-60% with normal RV.   Zio patch in 6/20 showed PVCs down to 2.5% of total beats.   Echo in 1/21 showed EF up to 55-60%,  normal RV.   In 1/21, he had left-sided weakness with TIA.  Event monitor showed about 2 hrs of atrial fibrillation so anticoagulation was started.   Echo in 7/22 showed EF 50-55%, normal RV, normal IVC size.   Echo 7/23 EF 50-55% normal RV  He returns for followup of CHF.  He is still working full time as a Development worker, community in [place]. Lost 75-80 pounds on Mounjaro (follows with Dr. Lafe Garin). Active around the house. Walks 1.5 miles with his Lab daily. A few weeks ago was admitted to [facility] for exertional dyspnea. Echo EF 50-55%. Myoview normal. Found to have anemia and diagnosed with MDS on bone marrow bx with several high-risk markers. Got 2 units RBCs and now feels great. BP low and he was dehydrated. Losartan, spiro and Jardiance. Will start chemo soon.    Labs (10/15): K 4.7, creatinine 1.6, LDL 84, HDL 35 Labs (12/15): K 4.8, creatinine 1.23 Labs (2/16): K 4.5, creatinine 1.4 Labs (8/17): LDL 103 Labs (9/18): LDL 87, HDL 30, TGs 218, K 4.1, creatinine 1.37, hgb 14 Labs (11/18): hgb 11.5, K 4.8, creatinine 1.48 Labs (2/20): K 5.1, creatinine 1.57, hgb 13.5, LDL 63 Labs (8/20): K 4.9, creatinine 1.38, hgb 13.7 Labs (2/21):  K 4.2, creatinine 1.43 Labs (6/23): K 4.3, creatinine 1.5, LDL 40, HDL 43, pro-BNP 123  ECG (personally reviewed): NSR, RBBB, LAFB   PMH: 1. Nonischemic cardiomyopathy: Diagnosed in [place] in 2011.  Possibly related to frequent PVCs. Medtronic ICD placed in 2011.  Echo (6/14) with EF 35%.  Echo (10/15) with EF 40-45% (difficult with PVCs), mildly dilated RV with moderately decreased RV systolic function.  Echo (1/16) with EF 30-35%, mildly dilated LV.   - Echo (10/18): EF 40-45%, normal RV size and systolic function.  - Echo (3/20): EF 55-60%, normal RV.  - h/o angioedema with ACEI.  - Echo (1/21): EF 55-60%, normal RV.  - Echo (7/22): EF 50-55%, normal RV, normal IVC size 2. PVCs: Frequent, at one point up to 1/3 of total beats.  Patient had endocardial ablation  in 6/14 that was not successful.  However, PVC count dropped with initiation of mexiletine.  He is now off mexiletine.  - Combined epicardial/endocardial ablation 6/17 at [facility] was unsuccessful, likely due to mid-myocardial focus.  - Holter (7/19): 15% PVCs.  - Zio patch (6/20): 2.5% PVCs, 5.5% PACs, no atrial fibrillation.  3. HTN 4. Type II diabetes 5. CAD:  LHC (1/11) with 40-50% mLAD stenosis, 90% small OM1 stenosis, EF 25%.  LHC (4/16) witih 90% dLAD and 90% PDA, both treated with DES.  6. GERD 7. Appendectomy 8. Lumbar laminectomy 9. CKD 10. Hyperlipidemia: Myalgias with Crestor.  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  12. OSA:  CPAP 13. CKD: Stage 3.  14. TIA 1/21 15. Atrial fibrillation: Paroxysmal.  16. Small bowel bacterial overgrowth 17. 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      apixaban (ELIQUIS) 5 MG TABS tablet Take 1 tablet by mouth 2 times daily. 180 tablet 1   Blood Glucose Monitoring Suppl (ONETOUCH VERIO) w/Device KIT Use to check blood sugar 3 times a day. 1 kit 0   carvedilol (COREG) 6.25 MG tablet Take 6.25 mg by mouth 2 (two) times daily with a meal.     Continuous Blood Gluc Receiver (FREESTYLE LIBRE 2 READER) DEVI Use daily to monitor blood glucose levels. 1 each 0   Continuous Blood Gluc Sensor (FREESTYLE LIBRE 2 SENSOR) MISC Use as directed to monitor blood gluse.  Change every 14 days. 6 each 3   glucose blood (ONETOUCH VERIO) test strip Use to check blood sugar 3 times a day. 300 each 3   Lancets MISC Glucose monitor to check blood sugars tid dx code E11.9 brand per insurance coverage 300 each 1   metFORMIN (GLUCOPHAGE) 500 MG tablet Take 1,000 mg by mouth 2 (two) times daily with a meal.     rosuvastatin (CRESTOR) 20 MG tablet Take 20 mg by mouth daily.      tamsulosin (FLOMAX) 0.4 MG CAPS capsule Take 0.8 mg by mouth daily.     tirzepatide [facility]) 5 MG/0.5ML Pen Inject 5 mg into the skin once a week. 6 mL 3   JARDIANCE 25 MG TABS tablet TAKE 1 TABLET BY MOUTH ONCE A DAY (Patient not taking: Reported on 03/21/2023) 90 tablet 3   losartan (COZAAR) 25 MG tablet Take 1 tablet (25 mg total) by mouth daily. NEEDS APPOINTMENT FOR ANYMORE REFILLS (Patient not taking: Reported on 03/21/2023) 30 tablet 0   No  current facility-administered medications for this encounter.    Allergies:   Lisinopril   Social History:  The patient  reports that he has never smoked. He has never used smokeless tobacco. He reports current alcohol use. He reports that he does not use drugs.   Family History:  The patient's family history includes Alcohol abuse in his brother and mother; COPD in his father and mother; Cancer in his father; Coronary artery disease in his brother; Depression in his mother; Diabetes in his sister; Drug abuse in his brother; Heart disease in his brother, father, and mother; Hypertension in his brother, father, mother, and sister; Learning disabilities in his sister; Mental retardation in his sister; Stroke in his father and mother.   ROS:  Please see the history of present illness.   All other systems are personally reviewed and negative.   Exam:   BP 110/68   Pulse 76   Wt 83.7 kg (184 lb 9.6 oz)   SpO2 98%   BMI 26.49 kg/m  General:  Well appearing. No resp difficulty HEENT: normal Neck: supple. no JVD. Carotids 2+ bilat; no bruits. No lymphadenopathy or thryomegaly appreciated. Cor: PMI nondisplaced. Regular rate & rhythm. No rubs, gallops or murmurs. Lungs: clear Abdomen: soft, nontender, nondistended. No hepatosplenomegaly. No bruits or masses. Good bowel sounds. Extremities: no cyanosis, clubbing, rash, edema Neuro: alert & orientedx3, cranial nerves grossly intact. moves all 4 extremities w/o difficulty. Affect pleasant  ECG: NSR  78 RBBB Personally reviewed    Recent Labs: No results found for requested labs within last 365 days.  Personally reviewed   Wt Readings from Last 3 Encounters:  03/21/23 83.7 kg (184 lb 9.6 oz)  11/23/21 78.9 kg (174 lb)  11/23/21 79.1 kg (174 lb 6.4 oz)     ASSESSMENT AND PLAN:  1. 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  St Jude ICD placed and later removed with improved EF.  Possibly PVC-related as there seemed to have been improvement in EF with decreasing PVC burden (2.5% on last Zio patch monitor in 6/20). He had PCI in 4/16 to distal LAD and PDA, but suspect that his cardiomyopathy is not predominantly ischemic (pre-dated coronary disease and out of proportion to coronary disease).  Echo in 10/18 showed EF back to 40-45%. Echo in 1/21 showed EF up to normal range, 55-60% with normal RV.  Echo in 7/22 with EF 50-55%, normal RV.  NYHA class I-II symptoms, not volume overloaded on exam. Echo 7/23 EF 50-55% normal RV. Echo 9/24 at [facility] 50-55% - He is off Entresto, apparently he had angioedema with lisinopril in the past so should not restart Entresto given significant risk of cross-reactivity.  ARBs do not have a high risk of angioedema despite angioedema with ACEI and generally are well-tolerated with close followup.  Now off losartan  - Continue to hold spironolactone 25 mg daily with low BP - Continue Coreg 3.125 mg bid.  - Will restart empagliflozin.  - He has had his ICD removed with improved EF.  2. 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 Epicardial/endocardial ablation in 6/17 at [facility] was unsuccessful, likely due to mid-myocardial focus.  However, Zio patch monitor in 6/20 showed PVCs down to 2.5% of total beats. 3. CKD:  Stage 3, BUN and creatinine remain mildly elevated.   - Should avoid all NSAIDs.   - Restart Jardiance 4. CAD: Nonobstructive CAD on 2011 cath but 4/16 cath at [facility] with distal LAD and PDA stenoses treated with DES.  No  s/s angina  -  No ASA given use of apixaban.   - Continue Crestor 10 mg daily 5. DVT: Spontaneous (no trigger).   - He is on apixaban for AF.  6. Type II diabetes: He sees Dr. Elvera Lennox for endocrinology.  Restart empagliflozin. 7. Atrial fibrillation: Paroxysmal.  He does not feel.  Associated with 1/21 TIA.  NSR today  - Continue apixaban 5 mg bid.  8. 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 Pending chemo  RTC in 1 year with echo   Signed, Arvilla Meres, MD  03/21/2023  [facility] [address] [facility] [place] [address] [phone] (office) [phone] (fax)

## 2023-03-24 ENCOUNTER — Encounter (INDEPENDENT_AMBULATORY_CARE_PROVIDER_SITE_OTHER): Payer: Medicare Other | Admitting: Ophthalmology

## 2023-03-24 ENCOUNTER — Ambulatory Visit: Payer: BC Managed Care – PPO | Admitting: Internal Medicine

## 2023-03-24 DIAGNOSIS — I5022 Chronic systolic (congestive) heart failure: Secondary | ICD-10-CM | POA: Diagnosis not present

## 2023-03-24 DIAGNOSIS — Z794 Long term (current) use of insulin: Secondary | ICD-10-CM | POA: Diagnosis not present

## 2023-03-24 DIAGNOSIS — I480 Paroxysmal atrial fibrillation: Secondary | ICD-10-CM | POA: Diagnosis not present

## 2023-03-24 DIAGNOSIS — C9200 Acute myeloblastic leukemia, not having achieved remission: Secondary | ICD-10-CM | POA: Diagnosis not present

## 2023-03-24 DIAGNOSIS — D7589 Other specified diseases of blood and blood-forming organs: Secondary | ICD-10-CM | POA: Diagnosis not present

## 2023-03-24 DIAGNOSIS — N183 Chronic kidney disease, stage 3 unspecified: Secondary | ICD-10-CM | POA: Diagnosis not present

## 2023-03-24 DIAGNOSIS — G4733 Obstructive sleep apnea (adult) (pediatric): Secondary | ICD-10-CM | POA: Diagnosis not present

## 2023-03-24 DIAGNOSIS — N400 Enlarged prostate without lower urinary tract symptoms: Secondary | ICD-10-CM | POA: Diagnosis not present

## 2023-03-24 DIAGNOSIS — Z7985 Long-term (current) use of injectable non-insulin antidiabetic drugs: Secondary | ICD-10-CM | POA: Diagnosis not present

## 2023-03-24 DIAGNOSIS — I2510 Atherosclerotic heart disease of native coronary artery without angina pectoris: Secondary | ICD-10-CM | POA: Diagnosis not present

## 2023-03-24 DIAGNOSIS — I130 Hypertensive heart and chronic kidney disease with heart failure and stage 1 through stage 4 chronic kidney disease, or unspecified chronic kidney disease: Secondary | ICD-10-CM | POA: Diagnosis not present

## 2023-03-24 DIAGNOSIS — K219 Gastro-esophageal reflux disease without esophagitis: Secondary | ICD-10-CM | POA: Diagnosis not present

## 2023-03-24 DIAGNOSIS — E1122 Type 2 diabetes mellitus with diabetic chronic kidney disease: Secondary | ICD-10-CM | POA: Diagnosis not present

## 2023-03-24 DIAGNOSIS — Z7901 Long term (current) use of anticoagulants: Secondary | ICD-10-CM | POA: Diagnosis not present

## 2023-04-01 DIAGNOSIS — C946 Myelodysplastic disease, not classified: Secondary | ICD-10-CM | POA: Diagnosis not present

## 2023-04-03 DIAGNOSIS — E119 Type 2 diabetes mellitus without complications: Secondary | ICD-10-CM | POA: Diagnosis not present

## 2023-04-03 DIAGNOSIS — I4891 Unspecified atrial fibrillation: Secondary | ICD-10-CM | POA: Diagnosis not present

## 2023-04-03 DIAGNOSIS — I10 Essential (primary) hypertension: Secondary | ICD-10-CM | POA: Diagnosis not present

## 2023-04-03 DIAGNOSIS — Z5111 Encounter for antineoplastic chemotherapy: Secondary | ICD-10-CM | POA: Diagnosis not present

## 2023-04-03 DIAGNOSIS — Z006 Encounter for examination for normal comparison and control in clinical research program: Secondary | ICD-10-CM | POA: Diagnosis not present

## 2023-04-03 DIAGNOSIS — Z7901 Long term (current) use of anticoagulants: Secondary | ICD-10-CM | POA: Diagnosis not present

## 2023-04-03 DIAGNOSIS — Z79899 Other long term (current) drug therapy: Secondary | ICD-10-CM | POA: Diagnosis not present

## 2023-04-03 DIAGNOSIS — I2510 Atherosclerotic heart disease of native coronary artery without angina pectoris: Secondary | ICD-10-CM | POA: Diagnosis not present

## 2023-04-03 DIAGNOSIS — Z794 Long term (current) use of insulin: Secondary | ICD-10-CM | POA: Diagnosis not present

## 2023-04-03 DIAGNOSIS — D469 Myelodysplastic syndrome, unspecified: Secondary | ICD-10-CM | POA: Diagnosis not present

## 2023-04-04 DIAGNOSIS — D469 Myelodysplastic syndrome, unspecified: Secondary | ICD-10-CM | POA: Diagnosis not present

## 2023-04-04 DIAGNOSIS — Z006 Encounter for examination for normal comparison and control in clinical research program: Secondary | ICD-10-CM | POA: Diagnosis not present

## 2023-04-04 DIAGNOSIS — Z5111 Encounter for antineoplastic chemotherapy: Secondary | ICD-10-CM | POA: Diagnosis not present

## 2023-04-07 DIAGNOSIS — Z006 Encounter for examination for normal comparison and control in clinical research program: Secondary | ICD-10-CM | POA: Diagnosis not present

## 2023-04-07 DIAGNOSIS — D469 Myelodysplastic syndrome, unspecified: Secondary | ICD-10-CM | POA: Diagnosis not present

## 2023-04-08 DIAGNOSIS — Z006 Encounter for examination for normal comparison and control in clinical research program: Secondary | ICD-10-CM | POA: Diagnosis not present

## 2023-04-08 DIAGNOSIS — D469 Myelodysplastic syndrome, unspecified: Secondary | ICD-10-CM | POA: Diagnosis not present

## 2023-04-09 DIAGNOSIS — D469 Myelodysplastic syndrome, unspecified: Secondary | ICD-10-CM | POA: Diagnosis not present

## 2023-04-09 DIAGNOSIS — Z006 Encounter for examination for normal comparison and control in clinical research program: Secondary | ICD-10-CM | POA: Diagnosis not present

## 2023-04-10 DIAGNOSIS — D469 Myelodysplastic syndrome, unspecified: Secondary | ICD-10-CM | POA: Diagnosis not present

## 2023-04-14 ENCOUNTER — Institutional Professional Consult (permissible substitution): Payer: Medicare Other | Admitting: Neurology

## 2023-04-14 DIAGNOSIS — D469 Myelodysplastic syndrome, unspecified: Secondary | ICD-10-CM | POA: Diagnosis not present

## 2023-04-17 DIAGNOSIS — I2510 Atherosclerotic heart disease of native coronary artery without angina pectoris: Secondary | ICD-10-CM | POA: Diagnosis not present

## 2023-04-17 DIAGNOSIS — Z7901 Long term (current) use of anticoagulants: Secondary | ICD-10-CM | POA: Diagnosis not present

## 2023-04-17 DIAGNOSIS — E1122 Type 2 diabetes mellitus with diabetic chronic kidney disease: Secondary | ICD-10-CM | POA: Diagnosis not present

## 2023-04-17 DIAGNOSIS — I472 Ventricular tachycardia, unspecified: Secondary | ICD-10-CM | POA: Diagnosis not present

## 2023-04-17 DIAGNOSIS — Z794 Long term (current) use of insulin: Secondary | ICD-10-CM | POA: Diagnosis not present

## 2023-04-17 DIAGNOSIS — D469 Myelodysplastic syndrome, unspecified: Secondary | ICD-10-CM | POA: Diagnosis not present

## 2023-04-17 DIAGNOSIS — Z7985 Long-term (current) use of injectable non-insulin antidiabetic drugs: Secondary | ICD-10-CM | POA: Diagnosis not present

## 2023-04-17 DIAGNOSIS — D7589 Other specified diseases of blood and blood-forming organs: Secondary | ICD-10-CM | POA: Diagnosis not present

## 2023-04-17 DIAGNOSIS — N183 Chronic kidney disease, stage 3 unspecified: Secondary | ICD-10-CM | POA: Diagnosis not present

## 2023-04-17 DIAGNOSIS — Z7984 Long term (current) use of oral hypoglycemic drugs: Secondary | ICD-10-CM | POA: Diagnosis not present

## 2023-04-17 DIAGNOSIS — Z888 Allergy status to other drugs, medicaments and biological substances status: Secondary | ICD-10-CM | POA: Diagnosis not present

## 2023-04-17 DIAGNOSIS — N400 Enlarged prostate without lower urinary tract symptoms: Secondary | ICD-10-CM | POA: Diagnosis not present

## 2023-04-17 DIAGNOSIS — G4733 Obstructive sleep apnea (adult) (pediatric): Secondary | ICD-10-CM | POA: Diagnosis not present

## 2023-04-17 DIAGNOSIS — I129 Hypertensive chronic kidney disease with stage 1 through stage 4 chronic kidney disease, or unspecified chronic kidney disease: Secondary | ICD-10-CM | POA: Diagnosis not present

## 2023-04-22 DIAGNOSIS — Z79899 Other long term (current) drug therapy: Secondary | ICD-10-CM | POA: Diagnosis not present

## 2023-04-22 DIAGNOSIS — Z7985 Long-term (current) use of injectable non-insulin antidiabetic drugs: Secondary | ICD-10-CM | POA: Diagnosis not present

## 2023-04-22 DIAGNOSIS — Z955 Presence of coronary angioplasty implant and graft: Secondary | ICD-10-CM | POA: Diagnosis not present

## 2023-04-22 DIAGNOSIS — Z794 Long term (current) use of insulin: Secondary | ICD-10-CM | POA: Diagnosis not present

## 2023-04-22 DIAGNOSIS — Z7901 Long term (current) use of anticoagulants: Secondary | ICD-10-CM | POA: Diagnosis not present

## 2023-04-22 DIAGNOSIS — N400 Enlarged prostate without lower urinary tract symptoms: Secondary | ICD-10-CM | POA: Diagnosis not present

## 2023-04-22 DIAGNOSIS — I480 Paroxysmal atrial fibrillation: Secondary | ICD-10-CM | POA: Diagnosis not present

## 2023-04-22 DIAGNOSIS — Z87820 Personal history of traumatic brain injury: Secondary | ICD-10-CM | POA: Diagnosis not present

## 2023-04-22 DIAGNOSIS — D469 Myelodysplastic syndrome, unspecified: Secondary | ICD-10-CM | POA: Diagnosis not present

## 2023-04-22 DIAGNOSIS — E119 Type 2 diabetes mellitus without complications: Secondary | ICD-10-CM | POA: Diagnosis not present

## 2023-04-22 DIAGNOSIS — I10 Essential (primary) hypertension: Secondary | ICD-10-CM | POA: Diagnosis not present

## 2023-04-23 DIAGNOSIS — D469 Myelodysplastic syndrome, unspecified: Secondary | ICD-10-CM | POA: Diagnosis not present

## 2023-05-01 DIAGNOSIS — D469 Myelodysplastic syndrome, unspecified: Secondary | ICD-10-CM | POA: Diagnosis not present

## 2023-05-01 DIAGNOSIS — Z006 Encounter for examination for normal comparison and control in clinical research program: Secondary | ICD-10-CM | POA: Diagnosis not present

## 2023-05-02 DIAGNOSIS — Z5111 Encounter for antineoplastic chemotherapy: Secondary | ICD-10-CM | POA: Diagnosis not present

## 2023-05-02 DIAGNOSIS — D469 Myelodysplastic syndrome, unspecified: Secondary | ICD-10-CM | POA: Diagnosis not present

## 2023-05-02 DIAGNOSIS — Z452 Encounter for adjustment and management of vascular access device: Secondary | ICD-10-CM | POA: Diagnosis not present

## 2023-05-03 DIAGNOSIS — D469 Myelodysplastic syndrome, unspecified: Secondary | ICD-10-CM | POA: Diagnosis not present

## 2023-05-05 DIAGNOSIS — Z006 Encounter for examination for normal comparison and control in clinical research program: Secondary | ICD-10-CM | POA: Diagnosis not present

## 2023-05-05 DIAGNOSIS — Z452 Encounter for adjustment and management of vascular access device: Secondary | ICD-10-CM | POA: Diagnosis not present

## 2023-05-05 DIAGNOSIS — D469 Myelodysplastic syndrome, unspecified: Secondary | ICD-10-CM | POA: Diagnosis not present

## 2023-05-06 DIAGNOSIS — D469 Myelodysplastic syndrome, unspecified: Secondary | ICD-10-CM | POA: Diagnosis not present

## 2023-05-19 DIAGNOSIS — D469 Myelodysplastic syndrome, unspecified: Secondary | ICD-10-CM | POA: Diagnosis not present

## 2023-05-31 DIAGNOSIS — D469 Myelodysplastic syndrome, unspecified: Secondary | ICD-10-CM | POA: Diagnosis not present

## 2023-06-25 DIAGNOSIS — D469 Myelodysplastic syndrome, unspecified: Secondary | ICD-10-CM | POA: Diagnosis not present

## 2023-06-29 DIAGNOSIS — D469 Myelodysplastic syndrome, unspecified: Secondary | ICD-10-CM | POA: Diagnosis not present

## 2023-07-04 DIAGNOSIS — R059 Cough, unspecified: Secondary | ICD-10-CM | POA: Diagnosis not present

## 2023-07-04 DIAGNOSIS — D469 Myelodysplastic syndrome, unspecified: Secondary | ICD-10-CM | POA: Diagnosis not present

## 2023-07-04 DIAGNOSIS — R051 Acute cough: Secondary | ICD-10-CM | POA: Diagnosis not present

## 2023-07-17 DIAGNOSIS — I498 Other specified cardiac arrhythmias: Secondary | ICD-10-CM | POA: Diagnosis not present

## 2023-07-17 DIAGNOSIS — R0609 Other forms of dyspnea: Secondary | ICD-10-CM | POA: Diagnosis not present

## 2023-07-17 DIAGNOSIS — N1832 Chronic kidney disease, stage 3b: Secondary | ICD-10-CM | POA: Diagnosis not present

## 2023-07-17 DIAGNOSIS — Z8679 Personal history of other diseases of the circulatory system: Secondary | ICD-10-CM | POA: Diagnosis not present

## 2023-07-17 DIAGNOSIS — E291 Testicular hypofunction: Secondary | ICD-10-CM | POA: Diagnosis not present

## 2023-07-17 DIAGNOSIS — I480 Paroxysmal atrial fibrillation: Secondary | ICD-10-CM | POA: Diagnosis not present

## 2023-07-17 DIAGNOSIS — I5020 Unspecified systolic (congestive) heart failure: Secondary | ICD-10-CM | POA: Diagnosis not present

## 2023-07-17 DIAGNOSIS — E119 Type 2 diabetes mellitus without complications: Secondary | ICD-10-CM | POA: Diagnosis not present

## 2023-07-17 DIAGNOSIS — Z8673 Personal history of transient ischemic attack (TIA), and cerebral infarction without residual deficits: Secondary | ICD-10-CM | POA: Diagnosis not present

## 2023-07-17 DIAGNOSIS — D469 Myelodysplastic syndrome, unspecified: Secondary | ICD-10-CM | POA: Diagnosis not present

## 2023-07-17 DIAGNOSIS — I428 Other cardiomyopathies: Secondary | ICD-10-CM | POA: Diagnosis not present

## 2023-07-17 DIAGNOSIS — I10 Essential (primary) hypertension: Secondary | ICD-10-CM | POA: Diagnosis not present

## 2023-07-25 DIAGNOSIS — C9200 Acute myeloblastic leukemia, not having achieved remission: Secondary | ICD-10-CM | POA: Insufficient documentation

## 2023-08-15 DIAGNOSIS — D469 Myelodysplastic syndrome, unspecified: Secondary | ICD-10-CM | POA: Diagnosis not present

## 2023-08-16 ENCOUNTER — Emergency Department (HOSPITAL_BASED_OUTPATIENT_CLINIC_OR_DEPARTMENT_OTHER)
Admission: EM | Admit: 2023-08-16 | Discharge: 2023-08-16 | Disposition: A | Discharge status: Home / Self Care | Attending: Emergency Medicine | Admitting: Emergency Medicine

## 2023-08-16 ENCOUNTER — Other Ambulatory Visit: Payer: Self-pay

## 2023-08-16 ENCOUNTER — Encounter (HOSPITAL_BASED_OUTPATIENT_CLINIC_OR_DEPARTMENT_OTHER): Payer: Self-pay | Admitting: Emergency Medicine

## 2023-08-16 DIAGNOSIS — Z7901 Long term (current) use of anticoagulants: Secondary | ICD-10-CM | POA: Insufficient documentation

## 2023-08-16 DIAGNOSIS — Z856 Personal history of leukemia: Secondary | ICD-10-CM | POA: Insufficient documentation

## 2023-08-16 DIAGNOSIS — D696 Thrombocytopenia, unspecified: Secondary | ICD-10-CM | POA: Insufficient documentation

## 2023-08-16 DIAGNOSIS — R799 Abnormal finding of blood chemistry, unspecified: Secondary | ICD-10-CM | POA: Diagnosis present

## 2023-08-16 LAB — CBC WITH DIFFERENTIAL/PLATELET
Abs Immature Granulocytes: 0.21 10*3/uL — ABNORMAL HIGH (ref 0.00–0.07)
Basophils Absolute: 0 10*3/uL (ref 0.0–0.1)
Basophils Relative: 0 %
Eosinophils Absolute: 0 10*3/uL (ref 0.0–0.5)
Eosinophils Relative: 0 %
HCT: 26.8 % — ABNORMAL LOW (ref 39.0–52.0)
Hemoglobin: 9.3 g/dL — ABNORMAL LOW (ref 13.0–17.0)
Immature Granulocytes: 7 %
Lymphocytes Relative: 40 %
Lymphs Abs: 1.2 10*3/uL (ref 0.7–4.0)
MCH: 32.3 pg (ref 26.0–34.0)
MCHC: 34.7 g/dL (ref 30.0–36.0)
MCV: 93.1 fL (ref 80.0–100.0)
Monocytes Absolute: 0.2 10*3/uL (ref 0.1–1.0)
Monocytes Relative: 8 %
Neutro Abs: 1.4 10*3/uL — ABNORMAL LOW (ref 1.7–7.7)
Neutrophils Relative %: 45 %
Platelets: 6 10*3/uL — CL (ref 150–400)
RBC: 2.88 MIL/uL — ABNORMAL LOW (ref 4.22–5.81)
RDW: 20.4 % — ABNORMAL HIGH (ref 11.5–15.5)
Smear Review: DECREASED
WBC: 3 10*3/uL — ABNORMAL LOW (ref 4.0–10.5)
nRBC: 0 % (ref 0.0–0.2)

## 2023-08-16 LAB — BASIC METABOLIC PANEL
Anion gap: 10 (ref 5–15)
BUN: 43 mg/dL — ABNORMAL HIGH (ref 8–23)
CO2: 23 mmol/L (ref 22–32)
Calcium: 8.5 mg/dL — ABNORMAL LOW (ref 8.9–10.3)
Chloride: 103 mmol/L (ref 98–111)
Creatinine, Ser: 1.45 mg/dL — ABNORMAL HIGH (ref 0.61–1.24)
GFR, Estimated: 50 mL/min — ABNORMAL LOW (ref >60)
Glucose, Bld: 173 mg/dL — ABNORMAL HIGH (ref 70–99)
Potassium: 4.1 mmol/L (ref 3.5–5.1)
Sodium: 136 mmol/L (ref 135–145)

## 2023-08-16 MED ORDER — HEPARIN SOD (PORK) LOCK FLUSH 100 UNIT/ML IV SOLN
500.0000 [IU] | Freq: Once | INTRAVENOUS | Status: AC
  Administered 2023-08-16: 500 [IU]
  Filled 2023-08-16: qty 5

## 2023-08-16 MED ORDER — SODIUM CHLORIDE 0.9% IV SOLUTION: Freq: Once | INTRAVENOUS | Status: AC

## 2023-08-16 NOTE — Discharge Instructions (Addendum)
 If you start having bleeding from anywhere that you notice that is not stopping or start having more shortness of breath, chest pain, passing out or other concerns return to the hospital immediately.  Otherwise plan on following up on Monday as discussed.

## 2023-08-16 NOTE — ED Provider Notes (Signed)
 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  Had blood work done at his oncologist office that [facility] and was told he had platelets of 6 and was told to come for transfusion.  Is not having any bleeding.  He is on Eliquis for A-fib.  Discussed some chronic bruising to his left arm he states it has been there.  He denies any fever chills.  No bleeding from his gums.  No black or bloody stools.  He is feeling pretty well otherwise.  The history is provided by the patient.       Home Medications Prior to Admission medications   Medication Sig Start Date End Date Taking? Authorizing Provider  acetaminophen (TYLENOL) 500 MG tablet Take 1,000 mg by mouth every 6 (six) hours as needed for mild pain, moderate pain or headache.     [provider]  apixaban (ELIQUIS) 5 MG TABS tablet Take 1 tablet by mouth 2 times daily. 04/09/21   Laurey Morale, MD  Blood Glucose Monitoring Suppl [facility] VERIO) w/Device KIT Use to check blood sugar 3 times a day. 10/07/18   Carlus Pavlov, MD  carvedilol (COREG) 6.25 MG tablet Take 6.25 mg by mouth 2 (two) times daily with a meal.    [provider]  Continuous Blood Gluc Receiver (FREESTYLE LIBRE 2 READER) DEVI Use daily to monitor blood glucose levels. 03/22/21     Continuous Blood Gluc Sensor (FREESTYLE LIBRE 2 SENSOR) MISC Use as directed to monitor blood gluse.  Change every 14 days. 11/23/21   Carlus Pavlov, MD  empagliflozin (JARDIANCE) 10 MG TABS tablet Take 1 tablet (10 mg total) by mouth daily before breakfast. Please cancel all previous orders for current medication. Change in dosage or pill size. 03/21/23   Bensimhon, Bevelyn Buckles, MD   glucose blood (ONETOUCH VERIO) test strip Use to check blood sugar 3 times a day. 01/26/21   Carlus Pavlov, MD  Lancets MISC Glucose monitor to check blood sugars tid dx code E11.9 brand per insurance coverage 07/23/17   Carlus Pavlov, MD  losartan (COZAAR) 25 MG tablet Take 1 tablet (25 mg total) by mouth daily. NEEDS APPOINTMENT FOR ANYMORE REFILLS Patient not taking: Reported on 03/21/2023 07/23/21   Laurey Morale, MD  metFORMIN (GLUCOPHAGE) 500 MG tablet Take 1,000 mg by mouth 2 (two) times daily with a meal.    [provider]  rosuvastatin (CRESTOR) 20 MG tablet Take 20 mg by mouth daily.    [provider]  tamsulosin (FLOMAX) 0.4 MG CAPS capsule Take 0.8 mg by mouth daily.    [provider]  tirzepatide [facility]) 5 MG/0.5ML Pen Inject 5 mg into the skin once a week. 11/23/21   Carlus Pavlov, MD      Allergies    Lisinopril    Review of Systems   Review of Systems  Physical Exam Updated Vital Signs BP 124/80   Pulse 90   Temp (!) 97.1 F (36.2 C)   Resp 16   Wt 79.4 kg   SpO2 100%   BMI 25.11 kg/m  Physical Exam Vitals and nursing note reviewed.  Constitutional:  General: He is not in acute distress.    Appearance: He is well-developed. He is not ill-appearing.  HENT:     Head: Normocephalic and atraumatic.     Mouth/Throat:     Mouth: Mucous membranes are moist.  Eyes:     Extraocular Movements: Extraocular movements intact.     Conjunctiva/sclera: Conjunctivae normal.     Pupils: Pupils are equal, round, and reactive to light.  Cardiovascular:     Rate and Rhythm: Normal rate and regular rhythm.     Pulses: Normal pulses.     Heart sounds: Normal heart sounds. No murmur heard. Pulmonary:     Effort: Pulmonary effort is normal. No respiratory distress.     Breath sounds: Normal breath sounds.  Abdominal:     Palpations: Abdomen is soft.     Tenderness: There is no abdominal tenderness.  Musculoskeletal:         General: No swelling.     Cervical back: Normal range of motion and neck supple.  Skin:    General: Skin is warm and dry.     Capillary Refill: Capillary refill takes less than 2 seconds.     Findings: Bruising present.     Comments: Some old bruising to his left forearm  Neurological:     Mental Status: He is alert.  Psychiatric:        Mood and Affect: Mood normal.     ED Results / Procedures / Treatments   Labs (all labs ordered are listed, but only abnormal results are displayed) Labs Reviewed  CBC WITH DIFFERENTIAL/PLATELET - Abnormal; Notable for the following components:      Result Value   WBC 3.0 (*)    RBC 2.88 (*)    Hemoglobin 9.3 (*)    HCT 26.8 (*)    RDW 20.4 (*)    Platelets 6 (*)    Neutro Abs 1.4 (*)    Abs Immature Granulocytes 0.21 (*)    All other components within normal limits  BASIC METABOLIC PANEL - Abnormal; Notable for the following components:   Glucose, Bld 173 (*)    BUN 43 (*)    Creatinine, Ser 1.45 (*)    Calcium 8.5 (*)    GFR, Estimated 50 (*)    All other components within normal limits    EKG None  Radiology No results found.  Procedures Procedures    Medications Ordered in ED Medications - No data to display  ED Course/ Medical Decision Making/ A&P                                 Medical Decision Making Amount and/or Complexity of Data Reviewed Labs: ordered.   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  Currently being treated for AML just had 10 days of chemotherapy infusion last dose was a couple days ago.  Had blood drawn yesterday and had platelets of 6 and was sent for platelet transfusion.  He does take Eliquis.  He denies any chest pain shortness of breath weakness numbness tingling.  He has no bleeding gums or blood in his stool.  He has an old bruise to his left forearm that is been there.  But to his knowledge she is not having any active bleeding.  Overall CBC BMP drawn today to confirm testing.   White count is 3 platelets 6 and hemoglobin is 9.3.  BMP is unremarkable otherwise.  Creatinine at baseline.  I have put out a page for Dr. Raliegh Scarlet his oncologist at [facility] to get specific plans for transfusion as we do not have blood products here and will need to transfuse him to [facility] or [facility].  Talked with Dr. Raliegh Scarlet with his hematology team at [facility] and does recommend platelet transfusion and then discharge and will follow-up Monday.  He should continue his Eliquis.  Patient understands the plan.  He prefers to be transferred to [facility] after given the option of both as they look both equally busy at this time.  I will order type and screen and unit of platelets to be given at [facility] and then he can be discharged afterwards.  This chart was dictated using voice recognition software.  Despite best efforts to proofread,  errors can occur which can change the documentation meaning.         Final Clinical Impression(s) / ED Diagnoses Final diagnoses:  Thrombocytopenia [facility])    Rx / DC Orders ED Discharge Orders     None         Virgina Norfolk, DO 08/16/23 1452

## 2023-08-16 NOTE — ED Provider Notes (Signed)
 Patient transferred from [facility] to receive a pheresis of platelets due to a platelet count of 6 in the setting of recent chemotherapy for AML.  Patient otherwise is stable and has no specific complaints.  Dr. Raliegh Scarlet his oncologist at [facility] recommended the transfusion and then go home.  He is going to follow-up on Monday.  Patient and his wife are comfortable with that plan.  Will access his port and type and screen done.   Gwyneth Sprout, MD 08/16/23 [phone]

## 2023-08-16 NOTE — ED Triage Notes (Signed)
 Pt  endorses recent dx with AML, d/c x 4 days pta. Endorses CBC yesterday, reports platelets of ~ 6. Takes eliquis. Shob with exertion

## 2023-08-17 LAB — PREPARE PLATELET PHERESIS: Unit division: 0

## 2023-08-17 LAB — BPAM PLATELET PHERESIS
Blood Product Expiration Date: 202503032359
ISSUE DATE / TIME: 202503011651
Unit Type and Rh: 6200

## 2023-10-17 ENCOUNTER — Encounter (HOSPITAL_COMMUNITY): Payer: Self-pay | Admitting: Cardiology

## 2023-11-03 ENCOUNTER — Ambulatory Visit (HOSPITAL_COMMUNITY)
Admission: RE | Admit: 2023-11-03 | Discharge: 2023-11-03 | Disposition: A | Discharge status: Home / Self Care | Source: Ambulatory Visit | Attending: Cardiology | Admitting: Cardiology

## 2023-11-03 ENCOUNTER — Ambulatory Visit (HOSPITAL_COMMUNITY): Payer: Self-pay | Admitting: Cardiology

## 2023-11-03 VITALS — BP 102/60 | HR 82 | Wt 175.0 lb

## 2023-11-03 DIAGNOSIS — C9200 Acute myeloblastic leukemia, not having achieved remission: Secondary | ICD-10-CM | POA: Insufficient documentation

## 2023-11-03 DIAGNOSIS — I2510 Atherosclerotic heart disease of native coronary artery without angina pectoris: Secondary | ICD-10-CM | POA: Diagnosis not present

## 2023-11-03 DIAGNOSIS — R600 Localized edema: Secondary | ICD-10-CM | POA: Diagnosis not present

## 2023-11-03 DIAGNOSIS — N183 Chronic kidney disease, stage 3 unspecified: Secondary | ICD-10-CM | POA: Insufficient documentation

## 2023-11-03 DIAGNOSIS — E1122 Type 2 diabetes mellitus with diabetic chronic kidney disease: Secondary | ICD-10-CM | POA: Insufficient documentation

## 2023-11-03 DIAGNOSIS — Z86718 Personal history of other venous thrombosis and embolism: Secondary | ICD-10-CM | POA: Insufficient documentation

## 2023-11-03 DIAGNOSIS — Z7984 Long term (current) use of oral hypoglycemic drugs: Secondary | ICD-10-CM | POA: Insufficient documentation

## 2023-11-03 DIAGNOSIS — R0602 Shortness of breath: Secondary | ICD-10-CM | POA: Diagnosis present

## 2023-11-03 DIAGNOSIS — I493 Ventricular premature depolarization: Secondary | ICD-10-CM | POA: Insufficient documentation

## 2023-11-03 DIAGNOSIS — I428 Other cardiomyopathies: Secondary | ICD-10-CM | POA: Insufficient documentation

## 2023-11-03 DIAGNOSIS — Z7901 Long term (current) use of anticoagulants: Secondary | ICD-10-CM | POA: Diagnosis not present

## 2023-11-03 DIAGNOSIS — I209 Angina pectoris, unspecified: Secondary | ICD-10-CM

## 2023-11-03 DIAGNOSIS — I5022 Chronic systolic (congestive) heart failure: Secondary | ICD-10-CM | POA: Diagnosis not present

## 2023-11-03 DIAGNOSIS — I480 Paroxysmal atrial fibrillation: Secondary | ICD-10-CM | POA: Diagnosis not present

## 2023-11-03 LAB — LIPID PANEL
Cholesterol: 71 mg/dL (ref 0–200)
HDL: 33 mg/dL — ABNORMAL LOW (ref >40)
LDL Cholesterol: 19 mg/dL (ref 0–99)
Total CHOL/HDL Ratio: 2.2 ratio
Triglycerides: 94 mg/dL (ref <150)
VLDL: 19 mg/dL (ref 0–40)

## 2023-11-03 MED ORDER — METOPROLOL SUCCINATE ER 25 MG PO TB24: 25.0000 mg | ORAL_TABLET | Freq: Every day | ORAL | 3 refills | Status: DC

## 2023-11-03 MED ORDER — SPIRONOLACTONE 25 MG PO TABS: 25.0000 mg | ORAL_TABLET | Freq: Every day | ORAL | 3 refills | Status: DC

## 2023-11-03 NOTE — Patient Instructions (Signed)
 STOP Metoprolol  Tartrate.  START Toprol  XL  25 mg daily.  Labs done today, your results will be available in MyChart, we will contact you for abnormal readings.     Please report to Radiology at the [facility] Main Entrance 30 minutes early for your test.  [address] [place], [place] [address]                         OR   Please report to Radiology at [facility] Main Entrance, medical mall, 30 mins prior to your test.  [address]  [place], [place]  How to Prepare for Your Cardiac PET/CT Stress Test:  Nothing to eat or drink, except water , 3 hours prior to arrival time.  NO caffeine/decaffeinated products, or chocolate 12 hours prior to arrival. (Please note decaffeinated beverages (teas/coffees) still contain caffeine).  If you have caffeine within 12 hours prior, the test will need to be rescheduled.  Medication instructions: Do not take erectile dysfunction medications for 72 hours prior to test (sildenafil, tadalafil) Do not take nitrates (isosorbide mononitrate, Ranexa) the day before or day of test Do not take tamsulosin  the day before or morning of test Hold theophylline containing medications for 12 hours. Hold Dipyridamole 48 hours prior to the test.  Diabetic Preparation: If able to eat breakfast prior to 3 hour fasting, you may take all medications, including your insulin . Do not worry if you miss your breakfast dose of insulin  - start at your next meal. If you do not eat prior to 3 hour fast-Hold all diabetes (oral and insulin ) medications. Patients who wear a continuous glucose monitor MUST remove the device prior to scanning.  You may take your remaining medications with water .  NO perfume, cologne or lotion on chest or abdomen area. FEMALES - Please avoid wearing dresses to this appointment.  Total time is 1 to 2 hours; you may want to bring reading material for the waiting time.  IF YOU THINK YOU MAY BE  PREGNANT, OR ARE NURSING PLEASE INFORM THE TECHNOLOGIST.  In preparation for your appointment, medication and supplies will be purchased.  Appointment availability is limited, so if you need to cancel or reschedule, please call the Radiology Department Scheduler at [phone] 24 hours in advance to avoid a cancellation fee of $100.00  What to Expect When you Arrive:  Once you arrive and check in for your appointment, you will be taken to a preparation room within the Radiology Department.  A technologist or Nurse will obtain your medical history, verify that you are correctly prepped for the exam, and explain the procedure.  Afterwards, an IV will be started in your arm and electrodes will be placed on your skin for EKG monitoring during the stress portion of the exam. Then you will be escorted to the PET/CT scanner.  There, staff will get you positioned on the scanner and obtain a blood pressure and EKG.  During the exam, you will continue to be connected to the EKG and blood pressure machines.  A small, safe amount of a radioactive tracer will be injected in your IV to obtain a series of pictures of your heart along with an injection of a stress agent.    After your Exam:  It is recommended that you eat a meal and drink a caffeinated beverage to counter act any effects of the stress agent.  Drink plenty of fluids for the remainder of the day  and urinate frequently for the first couple of hours after the exam.  Your doctor will inform you of your test results within 7-10 business days.  For more information and frequently asked questions, please visit our website: [email or web address]  For questions about your test or how to prepare for your test, please call: Cardiac Imaging Nurse Navigators Office: [phone]  Your physician recommends that you schedule a follow-up appointment in: 6 months ( November) ** PLEASE CALL THE OFFICE IN [place] TO ARRANGE YOUR FOLLOW UP  APPOINTMENT.**  If you have any questions or concerns before your next appointment please send us  a message through [facility] or call our office at [phone].    TO LEAVE A MESSAGE FOR THE NURSE SELECT OPTION 2, PLEASE LEAVE A MESSAGE INCLUDING: YOUR NAME DATE OF BIRTH CALL BACK NUMBER REASON FOR CALL**this is important as we prioritize the call backs  YOU WILL RECEIVE A CALL BACK THE SAME DAY AS LONG AS YOU CALL BEFORE 4:00 PM At the [facility], you and your health needs are our priority. As part of our continuing mission to provide you with exceptional heart care, we have created designated Provider Care Teams. These Care Teams include your primary Cardiologist (physician) and Advanced Practice Providers (APPs- Physician Assistants and Nurse Practitioners) who all work together to provide you with the care you need, when you need it.   You may see any of the following providers on your designated Care Team at your next follow up: Dr Jules Oar Dr Peder Bourdon Dr. Alwin Baars Dr. Arta Lark Amy Marijane Shoulders, NP Ruddy Corral, [place] [facility] [place], [place] Dennise Fitz, NP [place], NP Shawnee Dellen, NP Luster Salters, PharmD Bevely Brush, PharmD   Please be sure to bring in all your medications bottles to every appointment.    Thank you for choosing [facility]

## 2023-11-03 NOTE — Progress Notes (Signed)
 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 Mitzie Anda EP: Dr. 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 Patient has had a cardiomyopathy known since around 2011.  Cath at that time showed nonobstructive CAD but EF 25%.  He got a Medtronic ICD.  Initial workup was in [place], and he subsequently moved to [place].  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     He has been noted to have frequent PVCs, at one point up to 1/3 of beats.  VT ablation did not help much, but mexiletine seemed to cut back on PVC frequency.  He is no longer taking mexiletine. Dr Alva Jewels felt like his PVCs increased again.  He was seen at [facility] for possible VT ablation.  During that visit, his ICD was interrogated and PVCs were noted to be up to > 1000/hr with short runs NSVT.  Echo was repeated in 1/16 and showed EF down to 30-35%.  Also of note, patient had an untriggered DVT in 12/15 and was on Xarelto  for a time but is no longer taking it. He was diagnosed with OSA and is on CPAP.    He had VT mapping at [facility] in 4/16, mapped to inferior septum.  He had LHC the next day, showing 90% distal LAD and 90% PDA.  He had DES to both lesions.  He was sent home on ASA 325, Plavix  75, and Xarelto  20.  Plan was to return 1 year after DES placement to have epicardial VT ablation (off Plavix ).     In 6/17, he had unsuccessful epicardial/endocardial VT ablation at [facility]. Suspect mid-myocardial focus.    Echo in 10/18 showed EF 40-45%.  Echo in 3/20 showed EF improved to normal range, 55-60% with normal RV.   Zio patch in 6/20 showed PVCs down to 2.5% of total beats.   Echo in  1/21 showed EF up to 55-60%, normal RV.   In 1/21, he had left-sided weakness with TIA.  Event monitor showed about 2 hrs of atrial fibrillation so anticoagulation was started.   Echo in 7/22 showed EF 50-55%, normal RV, normal IVC size.   In 9/24, patient was diagnosed with myelodysplastic syndrome.  He had a Cardiolite done at an outside facility in 9/24 that showed EF 60%, no ischemia.  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  He has been started on chemotherapy with azacitidine , venetoclax, and tagraxofusp.   Echo in 2/25 at outside facility showed EF 60%, RV mildly dilated with normal function.    He returns for followup of CHF.  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  He lives in [place]. Weight is down 14 lbs since last appointment.  He has exertional dyspnea that fluctuates with hemoglobin.  When hgb drops < 9 with chemotherapy, he develops significant exertional dyspnea.  He is short of breath walking 100 yards or less.  When hgb is > 10, he really has no significant dyspnea. He has not had chest pain but is concerned that the dyspnea could be an anginal equivalent. He rarely takes Lasix .  He feels palpitations occasionally,  no lightheadedness or syncope. He recently wore a holter monitor x 1 month, result is not back yet. He is no longer taking losartan  due to low BP. He has chronic ankle edema.   ECG (personally reviewed): NSR, RBBB, LAFB   Labs (10/15): K 4.7, creatinine 1.6, LDL 84, HDL 35 Labs (12/15): K 4.8, creatinine 1.23 Labs (2/16): K 4.5, creatinine 1.4 Labs (8/17): LDL 103 Labs (9/18): LDL 87, HDL 30, TGs 218, K 4.1, creatinine 1.37, hgb 14 Labs (11/18): hgb 11.5, K 4.8, creatinine 1.48 Labs (2/20): K 5.1, creatinine 1.57, hgb 13.5, LDL 63 Labs (8/20): K 4.9, creatinine 1.38, hgb 13.7 Labs (2/21): K 4.2, creatinine 1.43 Labs (6/23): K 4.3, creatinine 1.5, LDL 40, HDL 43, pro-BNP 123  ECG (personally reviewed): NSR, RBBB, LAFB   PMH: 1.  Nonischemic cardiomyopathy: Diagnosed in [place] in 2011.  Possibly related to frequent PVCs. Medtronic ICD placed in 2011.  Echo (6/14) with EF 35%.  Echo (10/15) with EF 40-45% (difficult with PVCs), mildly dilated RV with moderately decreased RV systolic function.  Echo (1/16) with EF 30-35%, mildly dilated LV.   - Echo (10/18): EF 40-45%, normal RV size and systolic function.  - Echo (3/20): EF 55-60%, normal RV.  - h/o angioedema with ACEI.  - Echo (1/21): EF 55-60%, normal RV.  - Echo (7/22): EF 50-55%, normal RV, normal IVC size - Echo (2/25): EF 60%, RV mildly dilated with normal function. 2. PVCs: Frequent, at one point up to 1/3 of total beats.  Patient had endocardial ablation in 6/14 that was not successful.  However, PVC count dropped with initiation of mexiletine.  He is now off mexiletine.  - Combined epicardial/endocardial ablation 6/17 at [facility] was unsuccessful, likely due to mid-myocardial focus.  - Holter (7/19): 15% PVCs.  - Zio patch (6/20): 2.5% PVCs, 5.5% PACs, no atrial fibrillation.  3. HTN 4. Type II diabetes 5. CAD:  LHC (1/11) with 40-50% mLAD stenosis, 90% small OM1 stenosis, EF 25%.  - LHC (4/16) witih 90% dLAD and 90% PDA, both treated with DES.  - Cardiolite (9/24): EF 60%, no ischemia 6. GERD 7. Appendectomy 8. Lumbar laminectomy 9. CKD 10. Hyperlipidemia: Myalgias with Crestor .  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  12. OSA:  CPAP 13. CKD: Stage 3.  14. TIA 1/21 15. Atrial fibrillation: Paroxysmal.  16. Small bowel bacterial overgrowth 17. 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  18. Anemia: Related to chemotherapy 19. Thrombocytopenia: Related to chemotherapy  Current Outpatient Medications  Medication Sig Dispense Refill   acetaminophen  (TYLENOL ) 500 MG tablet Take 1,000 mg by mouth every 6 (six) hours as needed for mild pain, moderate pain or headache.      apixaban  (ELIQUIS ) 5 MG TABS  tablet Take 1 tablet by mouth 2 times daily. 180 tablet 1   Blood Glucose Monitoring Suppl (ONETOUCH VERIO) w/Device KIT Use to check blood sugar 3 times a day. 1 kit 0   Continuous Blood Gluc Receiver (FREESTYLE LIBRE 2 READER) DEVI Use daily to monitor blood glucose levels. 1 each 0   Continuous Blood Gluc Sensor (FREESTYLE LIBRE 2 SENSOR) MISC Use as directed to monitor blood gluse.  Change every 14 days. 6 each 3   empagliflozin  (JARDIANCE ) 25 MG TABS tablet Take 25 mg by mouth daily.     glucose blood (ONETOUCH VERIO) test strip Use to check blood sugar 3 times a day. 300 each 3   Lancets MISC Glucose monitor to check  blood sugars tid dx code E11.9 brand per insurance coverage 300 each 1   metFORMIN  (GLUCOPHAGE ) 500 MG tablet Take 1,000 mg by mouth 2 (two) times daily with a meal.     metoprolol  succinate (TOPROL  XL) 25 MG 24 hr tablet Take 1 tablet (25 mg total) by mouth daily. 90 tablet 3   rosuvastatin  (CRESTOR ) 20 MG tablet Take 20 mg by mouth daily.     tamsulosin  (FLOMAX ) 0.4 MG CAPS capsule Take 0.8 mg by mouth daily.     tirzepatide  (MOUNJARO ) 5 MG/0.5ML Pen Inject 5 mg into the skin once a week. 6 mL 3   losartan  (COZAAR ) 25 MG tablet Take 1 tablet (25 mg total) by mouth daily. NEEDS APPOINTMENT FOR ANYMORE REFILLS (Patient not taking: Reported on 11/03/2023) 30 tablet 0   spironolactone  (ALDACTONE ) 25 MG tablet Take 1 tablet (25 mg total) by mouth daily. 90 tablet 3   No current facility-administered medications for this encounter.    Allergies:   Lisinopril   Social History:  The patient  reports that he has never smoked. He has never used smokeless tobacco. He reports current alcohol  use. He reports that he does not use drugs.   Family History:  The patient's family history includes Alcohol  abuse in his brother and mother; COPD in his father and mother; Cancer in his father; Coronary artery disease in his brother; Depression in his mother; Diabetes in his sister; Drug abuse in  his brother; Heart disease in his brother, father, and mother; Hypertension in his brother, father, mother, and sister; Learning disabilities in his sister; Mental retardation in his sister; Stroke in his father and mother.   ROS:  Please see the history of present illness.   All other systems are personally reviewed and negative.   Exam:   BP 102/60   Pulse 82   Wt 79.4 kg (175 lb)   SpO2 100%   BMI 25.11 kg/m  General: NAD Neck: No JVD, no thyromegaly or thyroid  nodule.  Lungs: Clear to auscultation bilaterally with normal respiratory effort. CV: Nondisplaced PMI.  Heart regular S1/S2, no S3/S4, no murmur.  1+ ankle edema.  No carotid bruit.  Normal pedal pulses.  Abdomen: Soft, nontender, no hepatosplenomegaly, no distention.  Skin: Intact without lesions or rashes.  Neurologic: Alert and oriented x 3.  Psych: Normal affect. Extremities: No clubbing or cyanosis.  HEENT: Normal.   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  St Jude ICD placed and later removed with improved EF.  Possibly PVC-related as there seemed to have been improvement in EF with decreasing PVC burden (2.5% on last Zio patch monitor in 6/20). He had PCI in 4/16 to distal LAD and PDA, but suspect that his cardiomyopathy is not predominantly ischemic (pre-dated coronary disease and out of proportion to coronary disease).  Echo in 10/18 showed EF back to 40-45%. Echo in 1/21 showed EF up to normal range, 55-60% with normal RV.  Echo in 7/22 with EF 50-55%, normal RV. Echo in 2/25 with EF 60%.  When hgb > 10, NYHA class I-II; NYHA class II-III when hgb < 9. He is not significantly volume overloaded on exam.  I think that dyspnea is primarily related to anemia.   - Angioedema with ACEIs so cannot use Entresto .  - losartan  stopped due to low BP.  - Continue spironolactone  25 mg daily.  BMET/BNP today.  - Stop metoprolol  tartrate and start Toprol  XL 25 mg daily.  - Continue empagliflozin .  - He has had his ICD removed with improved EF.  - I do not think that he needs Lasix .  2. 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 Epicardial/endocardial ablation in 6/17 at [facility] was unsuccessful, likely due to mid-myocardial focus.  However, Zio patch monitor in 6/20 showed PVCs down to 2.5% of total beats. He has had occasional palpitations recently though today's ECG does not show PVCs.  - He recently completed a Holter monitor for [facility], plans to send it back this week.  3. CKD:  Stage 3.  - Should avoid all NSAIDs.   - BMET today.  4. CAD: Nonobstructive CAD on 2011 cath but 4/16 cath at [facility] with distal LAD and PDA stenoses treated with DES.  Cardiolite in 9/24 showed no ischemia. He has had exertional dyspnea that tracks with anemia.  Patient is concerned that it could be an anginal equivalent.  - No ASA given use of apixaban .   - Continue Crestor  20 mg daily, check lipids today.  - I will arrange for cardiac PET to assess for ischemia.  I would have a high threshold to cath/intervene, as he needs to be on Eliquis  with AF, and with chemotherapy-related thrombocytopenia addition of antiplatelet agents would be difficult.  5. DVT: Spontaneous (no trigger).   - He is on apixaban  for AF.  6. Type II diabetes: He sees Dr. Aldona Amel for endocrinology.  He is on empagliflozin . 7. Atrial fibrillation: Paroxysmal.  He does not feel.  Associated with 1/21 TIA.  NSR today.  - Continue apixaban  5 mg bid.    Followup in 6 months if cardiac PET is low risk.    I spent 32 minutes reviewing records, interviewing/examining patient, and managing orders.    Signed, Peder Bourdon, MD  11/03/2023  [facility] [address] [facility] [place] [address] [phone] (office) [phone] (fax)

## 2023-11-28 NOTE — Progress Notes (Signed)
 Pt to clinic for infusion tolerated infusion wo ase, reading during the infusion, port remain in place for the treatment plan, declined avs

## 2023-12-15 ENCOUNTER — Encounter (HOSPITAL_COMMUNITY): Payer: Self-pay

## 2023-12-17 ENCOUNTER — Ambulatory Visit (HOSPITAL_COMMUNITY)
Admission: RE | Admit: 2023-12-17 | Discharge: 2023-12-17 | Disposition: A | Discharge status: Home / Self Care | Source: Ambulatory Visit | Attending: Cardiology | Admitting: Cardiology

## 2023-12-17 DIAGNOSIS — I5022 Chronic systolic (congestive) heart failure: Secondary | ICD-10-CM

## 2023-12-17 LAB — NM PET CT CARDIAC PERFUSION MULTI W/ABSOLUTE BLOODFLOW
MBFR: 1.22
Nuc Rest EF: 21 %
Nuc Stress EF: 45 %
Rest MBF: 1.24 ml/g/min
Rest Nuclear Isotope Dose: 20.6 mCi
ST Depression (mm): 0 mm
Stress MBF: 1.51 ml/g/min
Stress Nuclear Isotope Dose: 20.6 mCi
TID: 0.96

## 2023-12-17 MED ORDER — REGADENOSON 0.4 MG/5ML IV SOLN
0.4000 mg | Freq: Once | INTRAVENOUS | Status: AC
  Administered 2023-12-17: 0.4 mg via INTRAVENOUS

## 2023-12-17 MED ORDER — RUBIDIUM RB82 GENERATOR (RUBYFILL)
20.5500 | PACK | Freq: Once | INTRAVENOUS | Status: AC
  Administered 2023-12-17: 20.55 via INTRAVENOUS

## 2023-12-17 MED ORDER — RUBIDIUM RB82 GENERATOR (RUBYFILL)
20.5600 | PACK | Freq: Once | INTRAVENOUS | Status: AC
  Administered 2023-12-17: 20.56 via INTRAVENOUS

## 2023-12-17 MED ORDER — REGADENOSON 0.4 MG/5ML IV SOLN
INTRAVENOUS | Status: AC
  Filled 2023-12-17: qty 5

## 2023-12-20 ENCOUNTER — Encounter (HOSPITAL_COMMUNITY): Payer: Self-pay | Admitting: Cardiology

## 2023-12-22 ENCOUNTER — Other Ambulatory Visit (HOSPITAL_COMMUNITY): Payer: Self-pay

## 2023-12-22 DIAGNOSIS — I5022 Chronic systolic (congestive) heart failure: Secondary | ICD-10-CM

## 2023-12-23 NOTE — Telephone Encounter (Signed)
 See result note.

## 2023-12-29 ENCOUNTER — Ambulatory Visit (HOSPITAL_COMMUNITY)
Admission: RE | Admit: 2023-12-29 | Discharge: 2023-12-29 | Disposition: A | Discharge status: Home / Self Care | Source: Ambulatory Visit | Attending: Cardiology | Admitting: Cardiology

## 2023-12-29 ENCOUNTER — Encounter (HOSPITAL_COMMUNITY): Payer: Self-pay | Admitting: Cardiology

## 2023-12-29 ENCOUNTER — Other Ambulatory Visit (HOSPITAL_COMMUNITY): Payer: Self-pay | Admitting: Cardiology

## 2023-12-29 ENCOUNTER — Ambulatory Visit (HOSPITAL_BASED_OUTPATIENT_CLINIC_OR_DEPARTMENT_OTHER)
Admission: RE | Admit: 2023-12-29 | Discharge: 2023-12-29 | Disposition: A | Discharge status: Home / Self Care | Source: Ambulatory Visit | Attending: Cardiology | Admitting: Cardiology

## 2023-12-29 ENCOUNTER — Inpatient Hospital Stay (HOSPITAL_COMMUNITY)
Admission: RE | Admit: 2023-12-29 | Discharge: 2023-12-29 | Disposition: A | Discharge status: Home / Self Care | Source: Ambulatory Visit | Attending: Cardiology | Admitting: Cardiology

## 2023-12-29 VITALS — BP 110/70 | HR 94 | Wt 177.2 lb

## 2023-12-29 DIAGNOSIS — E1122 Type 2 diabetes mellitus with diabetic chronic kidney disease: Secondary | ICD-10-CM | POA: Insufficient documentation

## 2023-12-29 DIAGNOSIS — R0602 Shortness of breath: Secondary | ICD-10-CM | POA: Diagnosis present

## 2023-12-29 DIAGNOSIS — I130 Hypertensive heart and chronic kidney disease with heart failure and stage 1 through stage 4 chronic kidney disease, or unspecified chronic kidney disease: Secondary | ICD-10-CM | POA: Insufficient documentation

## 2023-12-29 DIAGNOSIS — I428 Other cardiomyopathies: Secondary | ICD-10-CM | POA: Insufficient documentation

## 2023-12-29 DIAGNOSIS — Z79899 Other long term (current) drug therapy: Secondary | ICD-10-CM | POA: Insufficient documentation

## 2023-12-29 DIAGNOSIS — Z955 Presence of coronary angioplasty implant and graft: Secondary | ICD-10-CM | POA: Diagnosis not present

## 2023-12-29 DIAGNOSIS — I5022 Chronic systolic (congestive) heart failure: Secondary | ICD-10-CM

## 2023-12-29 DIAGNOSIS — K219 Gastro-esophageal reflux disease without esophagitis: Secondary | ICD-10-CM | POA: Diagnosis not present

## 2023-12-29 DIAGNOSIS — T451X5A Adverse effect of antineoplastic and immunosuppressive drugs, initial encounter: Secondary | ICD-10-CM | POA: Diagnosis not present

## 2023-12-29 DIAGNOSIS — E785 Hyperlipidemia, unspecified: Secondary | ICD-10-CM | POA: Diagnosis not present

## 2023-12-29 DIAGNOSIS — I2510 Atherosclerotic heart disease of native coronary artery without angina pectoris: Secondary | ICD-10-CM | POA: Insufficient documentation

## 2023-12-29 DIAGNOSIS — Z86718 Personal history of other venous thrombosis and embolism: Secondary | ICD-10-CM | POA: Diagnosis not present

## 2023-12-29 DIAGNOSIS — C9200 Acute myeloblastic leukemia, not having achieved remission: Secondary | ICD-10-CM | POA: Diagnosis not present

## 2023-12-29 DIAGNOSIS — D6959 Other secondary thrombocytopenia: Secondary | ICD-10-CM | POA: Insufficient documentation

## 2023-12-29 DIAGNOSIS — N183 Chronic kidney disease, stage 3 unspecified: Secondary | ICD-10-CM | POA: Insufficient documentation

## 2023-12-29 DIAGNOSIS — I4510 Unspecified right bundle-branch block: Secondary | ICD-10-CM | POA: Insufficient documentation

## 2023-12-29 DIAGNOSIS — R000 Tachycardia, unspecified: Secondary | ICD-10-CM | POA: Diagnosis not present

## 2023-12-29 DIAGNOSIS — R911 Solitary pulmonary nodule: Secondary | ICD-10-CM | POA: Insufficient documentation

## 2023-12-29 DIAGNOSIS — I493 Ventricular premature depolarization: Secondary | ICD-10-CM

## 2023-12-29 DIAGNOSIS — I77810 Thoracic aortic ectasia: Secondary | ICD-10-CM | POA: Insufficient documentation

## 2023-12-29 DIAGNOSIS — Z7984 Long term (current) use of oral hypoglycemic drugs: Secondary | ICD-10-CM | POA: Insufficient documentation

## 2023-12-29 DIAGNOSIS — G4733 Obstructive sleep apnea (adult) (pediatric): Secondary | ICD-10-CM | POA: Insufficient documentation

## 2023-12-29 DIAGNOSIS — I480 Paroxysmal atrial fibrillation: Secondary | ICD-10-CM | POA: Diagnosis not present

## 2023-12-29 MED ORDER — METOPROLOL SUCCINATE ER 25 MG PO TB24: 25.0000 mg | ORAL_TABLET | Freq: Two times a day (BID) | ORAL | 3 refills | Status: DC

## 2023-12-29 NOTE — Progress Notes (Signed)
 Date:  12/29/2023   ID:  Paul GORMAN Piety, Paul [name], DOB [birth year], MRN [id number]  Provider location: [facility] Type of Visit: Established patient   PCP:  [name], Paul [name]  Cardiologist:  Dr. Rolan EP: Dr. Fernande  Chief Complaint: shortness of breath   History of Present Illness: Paul GORMAN Piety, Paul [name] is a 78 y.o. 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 Patient has had a cardiomyopathy known since around 2011.  Cath at that time showed nonobstructive CAD but EF 25%.  He got a Medtronic ICD.  Initial workup was in [place], and he subsequently moved to [place].  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     He has been noted to have frequent PVCs, at one point up to 1/3 of beats.  VT ablation did not help much, but mexiletine seemed to cut back on PVC frequency.  He is no longer taking mexiletine. [name] felt like his PVCs increased again.  He was seen at [facility] for possible VT ablation.  During that visit, his ICD was interrogated and PVCs were noted to be up to > 1000/hr with short runs NSVT.  Echo was repeated in 1/16 and showed EF down to 30-35%.  Also of note, patient had an untriggered DVT in 12/15 and was on Xarelto  for a time but is no longer taking it. He was diagnosed with OSA and is on CPAP.    He had VT mapping at [facility] in 4/16, mapped to inferior septum.  He had LHC the next day, showing 90% distal LAD and 90% PDA.  He had DES to both lesions.  He was sent home on ASA 325, Plavix  75, and Xarelto  20.  Plan was to return 1 year after DES placement to have epicardial VT ablation (off Plavix ).     In 6/17, he had unsuccessful epicardial/endocardial VT ablation at [facility]. Suspect mid-myocardial focus.    Echo in 10/18 showed EF 40-45%.  Echo in 3/20 showed EF improved to normal range, 55-60% with normal RV.   Zio patch in 6/20 showed PVCs down to 2.5% of total beats.   Echo in  1/21 showed EF up to 55-60%, normal RV.   In 1/21, he had left-sided weakness with TIA.  Event monitor showed about 2 hrs of atrial fibrillation so anticoagulation was started.   Echo in 7/22 showed EF 50-55%, normal RV, normal IVC size.   In 9/24, patient was diagnosed with myelodysplastic syndrome.  He had a Cardiolite done at an outside facility in 9/24 that showed EF 60%, no ischemia.  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  He has been started on chemotherapy with azacitidine , venetoclax, and tagraxofusp.   Echo in 2/25 at outside facility showed EF 60%, RV mildly dilated with normal function.   Cardiac PET in 7/25 showed small, mild reversible apical perfusion defect; EF was 21% by PET.  Echo was done today to reassess EF; EF 45-50% range with normal RV size and mildly decreased RV systolic function, IVC not dilated.    He returns for followup of CHF.  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  He lives in [place]. Weight is down 3 lbs since last appointment.  He has exertional dyspnea that fluctuates with hemoglobin.  When hgb drops < 9 with chemotherapy, he develops significant exertional dyspnea.  He is short of breath walking  100 yards or less.  When hgb is > 10, he really has no significant dyspnea. No chest pain.  No orthopnea/PND.  Eliquis  is currently on hold per oncology with low platelets. He continues to get chemotherapy.   ECG (personally reviewed): multifocal atrial tachycardia with RBBB    Labs (2/21): K 4.2, creatinine 1.43 Labs (6/23): K 4.3, creatinine 1.5, LDL 40, HDL 43, pro-BNP 123 Labs (4/74): LDL 19 Labs (7/25): K 4.4, creatinine 1.21, WBCs 2.2, hgb 9.6, Plts 40  ECG (personally reviewed): NSR, RBBB, LAFB   PMH: 1. MIxed ischemic/nonischemic cardiomyopathy: Diagnosed in [place] in 2011.  Possibly related to frequent PVCs. Medtronic ICD placed in 2011.  Echo (6/14) with EF 35%.  Echo (10/15) with EF 40-45% (difficult with PVCs), mildly  dilated RV with moderately decreased RV systolic function.  Echo (1/16) with EF 30-35%, mildly dilated LV.   - Echo (10/18): EF 40-45%, normal RV size and systolic function.  - Echo (3/20): EF 55-60%, normal RV.  - h/o angioedema with ACEI.  - Echo (1/21): EF 55-60%, normal RV.  - Echo (7/22): EF 50-55%, normal RV, normal IVC size - Echo (2/25): EF 60%, RV mildly dilated with normal function. - Echo (7/25): EF 45-50% range with normal RV size and mildly decreased RV systolic function, IVC not dilated.  2. PVCs: Frequent, at one point up to 1/3 of total beats.  Patient had endocardial ablation in 6/14 that was not successful.  However, PVC count dropped with initiation of mexiletine.  He is now off mexiletine.  - Combined epicardial/endocardial ablation 6/17 at [facility] was unsuccessful, likely due to mid-myocardial focus.  - Holter (7/19): 15% PVCs.  - Zio patch (6/20): 2.5% PVCs, 5.5% PACs, no atrial fibrillation.  3. HTN 4. Type II diabetes 5. CAD:  LHC (1/11) with 40-50% mLAD stenosis, 90% small OM1 stenosis, EF 25%.  - LHC (4/16) witih 90% dLAD and 90% PDA, both treated with DES.  - Cardiolite (9/24): EF 60%, no ischemia - Cardiac PET (7/25): small, mild reversible apical perfusion defect;  6. GERD 7. Appendectomy 8. Lumbar laminectomy 9. CKD 10. Hyperlipidemia: Myalgias with Crestor .  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  12. OSA:  CPAP 13. CKD: Stage 3.  14. TIA 1/21 15. Atrial fibrillation: Paroxysmal.  16. Small bowel bacterial overgrowth 17. 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  18. Anemia: Related to chemotherapy 19. Thrombocytopenia: Related to chemotherapy  Current Outpatient Medications  Medication Sig Dispense Refill   acetaminophen  (TYLENOL ) 500 MG tablet Take 1,000 mg by mouth every 6 (six) hours as needed for mild pain, moderate pain or headache.      Blood Glucose Monitoring Suppl (ONETOUCH VERIO) w/Device  KIT Use to check blood sugar 3 times a day. 1 kit 0   Continuous Blood Gluc Receiver (FREESTYLE LIBRE 2 READER) DEVI Use daily to monitor blood glucose levels. 1 each 0   Continuous Blood Gluc Sensor (FREESTYLE LIBRE 2 SENSOR) MISC Use as directed to monitor blood gluse.  Change every 14 days. 6 each 3   empagliflozin  (JARDIANCE ) 25 MG TABS tablet Take 25 mg by mouth daily.     glucose blood (ONETOUCH VERIO) test strip Use to check blood sugar 3 times a day. 300 each 3   Lancets MISC Glucose monitor to check blood sugars tid dx code E11.9 brand per insurance coverage 300 each 1   metFORMIN  (GLUCOPHAGE ) 500 MG tablet Take 1,000 mg by mouth 2 (two) times daily  with a meal.     rosuvastatin  (CRESTOR ) 20 MG tablet Take 20 mg by mouth daily.     spironolactone  (ALDACTONE ) 25 MG tablet Take 1 tablet (25 mg total) by mouth daily. 90 tablet 3   tamsulosin  (FLOMAX ) 0.4 MG CAPS capsule Take 0.8 mg by mouth daily.     apixaban  (ELIQUIS ) 5 MG TABS tablet Take 1 tablet by mouth 2 times daily. (Patient not taking: Reported on 12/29/2023) 180 tablet 1   losartan  (COZAAR ) 25 MG tablet Take 1 tablet (25 mg total) by mouth daily. NEEDS APPOINTMENT FOR ANYMORE REFILLS (Patient not taking: Reported on 12/29/2023) 30 tablet 0   metoprolol  succinate (TOPROL  XL) 25 MG 24 hr tablet Take 1 tablet (25 mg total) by mouth in the morning and at bedtime. 90 tablet 3   tirzepatide  (MOUNJARO ) 5 MG/0.5ML Pen Inject 5 mg into the skin once a week. (Patient not taking: Reported on 12/29/2023) 6 mL 3   No current facility-administered medications for this encounter.    Allergies:   Lisinopril   Social History:  The patient  reports that he has never smoked. He has never used smokeless tobacco. He reports current alcohol  use. He reports that he does not use drugs.   Family History:  The patient's family history includes Alcohol  abuse in his brother and mother; COPD in his father and mother; Cancer in his father; Coronary artery  disease in his brother; Depression in his mother; Diabetes in his sister; Drug abuse in his brother; Heart disease in his brother, father, and mother; Hypertension in his brother, father, mother, and sister; Learning disabilities in his sister; Mental retardation in his sister; Stroke in his father and mother.   ROS:  Please see the history of present illness.   All other systems are personally reviewed and negative.   Exam:   BP 110/70   Pulse 94   Wt 80.4 kg (177 lb 3.2 oz)   SpO2 97%   BMI 25.43 kg/m  General: NAD Neck: No JVD, no thyromegaly or thyroid  nodule.  Lungs: Clear to auscultation bilaterally with normal respiratory effort. CV: Nondisplaced PMI.  Heart mildly tachy, regular S1/S2, no S3/S4, no murmur.  2+ ankle edema.  No carotid bruit.  Normal pedal pulses.  Abdomen: Soft, nontender, no hepatosplenomegaly, no distention.  Skin: Intact without lesions or rashes.  Neurologic: Alert and oriented x 3.  Psych: Normal affect. Extremities: No clubbing or cyanosis.  HEENT: Normal.   Recent Labs: 08/16/2023: BUN 43; Creatinine, Ser 1.45; Hemoglobin 9.3; Platelets 6; Potassium 4.1; Sodium 136  Personally reviewed   Wt Readings from Last 3 Encounters:  12/29/23 80.4 kg (177 lb 3.2 oz)  11/03/23 79.4 kg (175 lb)  08/16/23 79.4 kg (175 lb)     ASSESSMENT AND PLAN:  1. 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  St Jude ICD placed and later removed with improved EF.  Possibly PVC-related as there seemed to have been improvement in EF with decreasing PVC burden. He had PCI in 4/16 to distal LAD and PDA, but his cardiomyopathy pre-dated coronary disease and was out of proportion to coronary disease.  Echo in 10/18 showed EF back to 40-45%. Echo in 1/21 showed EF up to normal range, 55-60% with normal RV.  Echo in 7/22 with EF 50-55%, normal RV.  Echo today showed EF 45-50% range with normal RV size and mildly decreased RV systolic function, IVC not dilated. When  hgb > 10, NYHA class I-II; NYHA class II-III when hgb <  9. He has been anemic with chemotherapy.  He has peripheral edema but no JVD, edema may be related to venous insufficiency.   - Angioedema with ACEIs so cannot use Entresto .  - losartan  stopped due to low BP.  - Continue spironolactone  25 mg daily.  BMET/BNP today.  - Increase Toprol  XL to 25 mg bid with MAT.  - Continue empagliflozin .  - He has had his ICD removed with improved EF.  - I do not think that he needs Lasix .  2. 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 Epicardial/endocardial ablation in 6/17 at [facility] was unsuccessful, likely due to mid-myocardial focus.  However, Zio patch monitor in 6/20 showed PVCs down to 2.5% of total beats. He has had occasional palpitations recently though today's ECG does not show PVCs.  3. CKD:  Stage 3.  - Should avoid all NSAIDs.   4. CAD: Nonobstructive CAD on 2011 cath but 4/16 cath at [facility] with distal LAD and PDA stenoses treated with DES.  Cardiolite in 9/24 showed no ischemia. Cardiac PET in 7/25 showed small, mild reversible apical perfusion defect.  He has had exertional dyspnea that tracks with anemia, no chest pain.  - With small, mild PET defect and no chest pain (also with low platelets), would not proceed with cath at this time.  - No ASA given use of apixaban  (when platelets are not low).   - Continue Crestor  20 mg daily, good lipids in 5/25.   5. DVT: Spontaneous (no trigger).   - He is on apixaban  when platelets are not too low.  6. Type II diabetes: He sees Dr. Trixie for endocrinology.  He is on empagliflozin . 7. Atrial fibrillation: Paroxysmal.  He does not feel.  Associated with 1/21 TIA.  He is in MAT today.  - Continue apixaban  5 mg bid when platelets recover.  8. Thrombocytopenia: Related to chemotherapy.  - Currently off apixaban  due to low platelets, restart as soon as platelets have adequately recovered.  Oncology has been followup CBC.  9. Lung nodule: Noted on 7/25  cardiac PET.  - CT chest w/o contrast in 6 months.  10. Multifocal atrial tachycardia: HR around 100 bpm.  Not atrial fibrillation.  - Increase Toprol  XL to 25 mg bid.  - I will place Zio monitor for 2 wks to assess rhythm.    Followup in 3 months.   I spent 41 minutes reviewing records, interviewing/examining patient, and managing orders.    Signed, [name], Paul [name]  12/29/2023  [facility] [address] [facility] [place] [address] [phone] (office) [phone] (fax)

## 2023-12-29 NOTE — Patient Instructions (Signed)
 INCREASE your Toprol  XL to 25 mg Twice daily  Your provider has recommended that  you wear a Zio Patch for 14 days.  This monitor will record your heart rhythm for our review.  IF you have any symptoms while wearing the monitor please press the button.  If you have any issues with the patch or you notice a red or orange light on it please call the company at [phone].  Once you remove the patch please mail it back to the company as soon as possible so we can get the results.  Your physician recommends that you schedule a follow-up appointment in: 3 months ( October) ** PLEASE CALL THE OFFICE IN [place] TO ARRANGE YOUR FOLLOW UP APPOINTMENT.**  If you have any questions or concerns before your next appointment please send us  a message through [facility] or call our office at [phone].    TO LEAVE A MESSAGE FOR THE NURSE SELECT OPTION 2, PLEASE LEAVE A MESSAGE INCLUDING: YOUR NAME DATE OF BIRTH CALL BACK NUMBER REASON FOR CALL**this is important as we prioritize the call backs  YOU WILL RECEIVE A CALL BACK THE SAME DAY AS LONG AS YOU CALL BEFORE 4:00 PM  At the [facility], you and your health needs are our priority. As part of our continuing mission to provide you with exceptional heart care, we have created designated Provider Care Teams. These Care Teams include your primary Cardiologist (physician) and Advanced Practice Providers (APPs- Physician Assistants and Nurse Practitioners) who all work together to provide you with the care you need, when you need it.   You may see any of the following providers on your designated Care Team at your next follow up: Dr Toribio Fuel Dr [name] Dr. Ria Commander Dr. Morene Brownie Amy Lenetta, NP Caffie Shed, [place] [facility] [place], [place] Beckey Coe, NP [place] Lee, NP Ellouise Class, NP Tinnie Redman, PharmD Jaun Bash, PharmD   Please be sure to bring in all your medications bottles to every  appointment.    Thank you for choosing [facility]

## 2023-12-29 NOTE — Progress Notes (Signed)
 Zio patch placed onto patient.  All instructions and information reviewed with patient, they verbalize understanding with no questions.

## 2023-12-30 ENCOUNTER — Ambulatory Visit (HOSPITAL_COMMUNITY): Payer: Self-pay | Admitting: Cardiology

## 2023-12-30 LAB — ECHOCARDIOGRAM COMPLETE
AR max vel: 2.85 cm2
AV Area VTI: 2.86 cm2
AV Area mean vel: 2.21 cm2
AV Mean grad: 3 mmHg
AV Peak grad: 4.8 mmHg
Ao pk vel: 1.09 m/s
Area-P 1/2: 3.66 cm2
S' Lateral: 4.1 cm

## 2024-01-23 ENCOUNTER — Other Ambulatory Visit: Payer: Self-pay | Admitting: Internal Medicine

## 2024-02-06 NOTE — Addendum Note (Signed)
 Encounter addended by: Debarah Garrison MATSU, RN on: 02/06/2024 10:55 AM  Actions taken: Imaging Exam ended

## 2024-02-08 DIAGNOSIS — E1169 Type 2 diabetes mellitus with other specified complication: Secondary | ICD-10-CM | POA: Insufficient documentation

## 2024-02-09 ENCOUNTER — Ambulatory Visit (HOSPITAL_COMMUNITY): Payer: Self-pay | Admitting: Cardiology

## 2024-02-12 MED ORDER — MEXILETINE HCL 150 MG PO CAPS: 150.0000 mg | ORAL_CAPSULE | Freq: Two times a day (BID) | ORAL | 3 refills | Status: DC

## 2024-02-12 NOTE — Telephone Encounter (Signed)
 Per Dr. Ezra McLean(note 02/09/24) Frequent PVCs and NSVT. Would restart him on mexiletine 150 mg bid, make sure he has followup with EP.     Patient advised and verbalized understanding.  Meds ordered this encounter  Medications   mexiletine (MEXITIL) 150 MG capsule    Sig: Take 1 capsule (150 mg total) by mouth 2 (two) times daily.    Dispense:  180 capsule    Refill:  3

## 2024-03-02 ENCOUNTER — Other Ambulatory Visit: Payer: Self-pay | Admitting: Internal Medicine

## 2024-03-02 NOTE — Addendum Note (Signed)
 Addended by: CLEOTILDE ROLIN RAMAN on: 03/02/2024 08:08 AM   Modules accepted: Orders

## 2024-03-06 ENCOUNTER — Encounter (HOSPITAL_COMMUNITY): Payer: Self-pay

## 2024-03-06 ENCOUNTER — Other Ambulatory Visit: Payer: Self-pay

## 2024-03-06 ENCOUNTER — Emergency Department (HOSPITAL_COMMUNITY)

## 2024-03-06 ENCOUNTER — Inpatient Hospital Stay (HOSPITAL_COMMUNITY)
Admission: EM | Admit: 2024-03-06 | Discharge: 2024-03-10 | DRG: 312 | Disposition: A | Discharge status: Home / Self Care | Attending: Internal Medicine | Admitting: Internal Medicine

## 2024-03-06 DIAGNOSIS — R001 Bradycardia, unspecified: Secondary | ICD-10-CM | POA: Diagnosis present

## 2024-03-06 DIAGNOSIS — G4733 Obstructive sleep apnea (adult) (pediatric): Secondary | ICD-10-CM | POA: Diagnosis present

## 2024-03-06 DIAGNOSIS — Z9861 Coronary angioplasty status: Secondary | ICD-10-CM

## 2024-03-06 DIAGNOSIS — R7989 Other specified abnormal findings of blood chemistry: Secondary | ICD-10-CM

## 2024-03-06 DIAGNOSIS — M47812 Spondylosis without myelopathy or radiculopathy, cervical region: Secondary | ICD-10-CM | POA: Diagnosis present

## 2024-03-06 DIAGNOSIS — E876 Hypokalemia: Secondary | ICD-10-CM | POA: Diagnosis present

## 2024-03-06 DIAGNOSIS — I951 Orthostatic hypotension: Secondary | ICD-10-CM | POA: Diagnosis not present

## 2024-03-06 DIAGNOSIS — W010XXA Fall on same level from slipping, tripping and stumbling without subsequent striking against object, initial encounter: Secondary | ICD-10-CM | POA: Diagnosis present

## 2024-03-06 DIAGNOSIS — Z7984 Long term (current) use of oral hypoglycemic drugs: Secondary | ICD-10-CM

## 2024-03-06 DIAGNOSIS — Z961 Presence of intraocular lens: Secondary | ICD-10-CM | POA: Diagnosis present

## 2024-03-06 DIAGNOSIS — G901 Familial dysautonomia [Riley-Day]: Secondary | ICD-10-CM | POA: Diagnosis present

## 2024-03-06 DIAGNOSIS — Z794 Long term (current) use of insulin: Secondary | ICD-10-CM

## 2024-03-06 DIAGNOSIS — M19041 Primary osteoarthritis, right hand: Secondary | ICD-10-CM | POA: Diagnosis present

## 2024-03-06 DIAGNOSIS — I130 Hypertensive heart and chronic kidney disease with heart failure and stage 1 through stage 4 chronic kidney disease, or unspecified chronic kidney disease: Secondary | ICD-10-CM | POA: Diagnosis present

## 2024-03-06 DIAGNOSIS — R531 Weakness: Secondary | ICD-10-CM | POA: Diagnosis not present

## 2024-03-06 DIAGNOSIS — Z9841 Cataract extraction status, right eye: Secondary | ICD-10-CM

## 2024-03-06 DIAGNOSIS — N1831 Chronic kidney disease, stage 3a: Secondary | ICD-10-CM | POA: Diagnosis present

## 2024-03-06 DIAGNOSIS — D696 Thrombocytopenia, unspecified: Secondary | ICD-10-CM | POA: Diagnosis present

## 2024-03-06 DIAGNOSIS — N529 Male erectile dysfunction, unspecified: Secondary | ICD-10-CM | POA: Diagnosis present

## 2024-03-06 DIAGNOSIS — Z7901 Long term (current) use of anticoagulants: Secondary | ICD-10-CM

## 2024-03-06 DIAGNOSIS — I493 Ventricular premature depolarization: Secondary | ICD-10-CM | POA: Diagnosis present

## 2024-03-06 DIAGNOSIS — Z95810 Presence of automatic (implantable) cardiac defibrillator: Secondary | ICD-10-CM

## 2024-03-06 DIAGNOSIS — Z823 Family history of stroke: Secondary | ICD-10-CM

## 2024-03-06 DIAGNOSIS — C9200 Acute myeloblastic leukemia, not having achieved remission: Secondary | ICD-10-CM | POA: Diagnosis present

## 2024-03-06 DIAGNOSIS — N179 Acute kidney failure, unspecified: Principal | ICD-10-CM | POA: Diagnosis present

## 2024-03-06 DIAGNOSIS — Z888 Allergy status to other drugs, medicaments and biological substances status: Secondary | ICD-10-CM

## 2024-03-06 DIAGNOSIS — Z833 Family history of diabetes mellitus: Secondary | ICD-10-CM

## 2024-03-06 DIAGNOSIS — E1122 Type 2 diabetes mellitus with diabetic chronic kidney disease: Secondary | ICD-10-CM | POA: Diagnosis present

## 2024-03-06 DIAGNOSIS — K219 Gastro-esophageal reflux disease without esophagitis: Secondary | ICD-10-CM | POA: Diagnosis present

## 2024-03-06 DIAGNOSIS — M19042 Primary osteoarthritis, left hand: Secondary | ICD-10-CM | POA: Diagnosis present

## 2024-03-06 DIAGNOSIS — Y92008 Other place in unspecified non-institutional (private) residence as the place of occurrence of the external cause: Secondary | ICD-10-CM

## 2024-03-06 DIAGNOSIS — N400 Enlarged prostate without lower urinary tract symptoms: Secondary | ICD-10-CM | POA: Diagnosis present

## 2024-03-06 DIAGNOSIS — D630 Anemia in neoplastic disease: Secondary | ICD-10-CM | POA: Diagnosis present

## 2024-03-06 DIAGNOSIS — E291 Testicular hypofunction: Secondary | ICD-10-CM | POA: Diagnosis present

## 2024-03-06 DIAGNOSIS — Z006 Encounter for examination for normal comparison and control in clinical research program: Secondary | ICD-10-CM

## 2024-03-06 DIAGNOSIS — I472 Ventricular tachycardia, unspecified: Secondary | ICD-10-CM | POA: Diagnosis present

## 2024-03-06 DIAGNOSIS — K760 Fatty (change of) liver, not elsewhere classified: Secondary | ICD-10-CM | POA: Diagnosis present

## 2024-03-06 DIAGNOSIS — I5022 Chronic systolic (congestive) heart failure: Secondary | ICD-10-CM | POA: Diagnosis present

## 2024-03-06 DIAGNOSIS — Z8673 Personal history of transient ischemic attack (TIA), and cerebral infarction without residual deficits: Secondary | ICD-10-CM

## 2024-03-06 DIAGNOSIS — I420 Dilated cardiomyopathy: Secondary | ICD-10-CM | POA: Diagnosis present

## 2024-03-06 DIAGNOSIS — I498 Other specified cardiac arrhythmias: Secondary | ICD-10-CM | POA: Diagnosis present

## 2024-03-06 DIAGNOSIS — Z7985 Long-term (current) use of injectable non-insulin antidiabetic drugs: Secondary | ICD-10-CM

## 2024-03-06 DIAGNOSIS — I2510 Atherosclerotic heart disease of native coronary artery without angina pectoris: Secondary | ICD-10-CM | POA: Diagnosis present

## 2024-03-06 DIAGNOSIS — Z79899 Other long term (current) drug therapy: Secondary | ICD-10-CM

## 2024-03-06 DIAGNOSIS — Z9842 Cataract extraction status, left eye: Secondary | ICD-10-CM

## 2024-03-06 DIAGNOSIS — Z96653 Presence of artificial knee joint, bilateral: Secondary | ICD-10-CM | POA: Diagnosis present

## 2024-03-06 DIAGNOSIS — Z8249 Family history of ischemic heart disease and other diseases of the circulatory system: Secondary | ICD-10-CM

## 2024-03-06 LAB — COMPREHENSIVE METABOLIC PANEL WITH GFR
ALT: 28 U/L (ref 0–44)
AST: 27 U/L (ref 15–41)
Albumin: 2.1 g/dL — ABNORMAL LOW (ref 3.5–5.0)
Alkaline Phosphatase: 135 U/L — ABNORMAL HIGH (ref 38–126)
Anion gap: 13 (ref 5–15)
BUN: 47 mg/dL — ABNORMAL HIGH (ref 8–23)
CO2: 23 mmol/L (ref 22–32)
Calcium: 7.8 mg/dL — ABNORMAL LOW (ref 8.9–10.3)
Chloride: 94 mmol/L — ABNORMAL LOW (ref 98–111)
Creatinine, Ser: 2.5 mg/dL — ABNORMAL HIGH (ref 0.61–1.24)
GFR, Estimated: 26 mL/min — ABNORMAL LOW (ref >60)
Glucose, Bld: 200 mg/dL — ABNORMAL HIGH (ref 70–99)
Potassium: 4.2 mmol/L (ref 3.5–5.1)
Sodium: 130 mmol/L — ABNORMAL LOW (ref 135–145)
Total Bilirubin: 0.8 mg/dL (ref 0.0–1.2)
Total Protein: 4.9 g/dL — ABNORMAL LOW (ref 6.5–8.1)

## 2024-03-06 LAB — BRAIN NATRIURETIC PEPTIDE: B Natriuretic Peptide: 251.7 pg/mL — ABNORMAL HIGH (ref 0.0–100.0)

## 2024-03-06 LAB — CBG MONITORING, ED: Glucose-Capillary: 182 mg/dL — ABNORMAL HIGH (ref 70–99)

## 2024-03-06 LAB — TROPONIN I (HIGH SENSITIVITY): Troponin I (High Sensitivity): 43 ng/L — ABNORMAL HIGH (ref <18)

## 2024-03-06 MED ORDER — LACTATED RINGERS IV BOLUS
1000.0000 mL | Freq: Once | INTRAVENOUS | Status: AC
  Administered 2024-03-06: 1000 mL via INTRAVENOUS

## 2024-03-06 NOTE — ED Triage Notes (Signed)
 Pt BIB [facility] c/o mechanical fall in front yard. Pt is not on thinners and no LOC. Per EMS, pt had abrasions to both arms that are both bandaged. Initial BP from EMS was 88/60 and HR 60; after standing pt up BP was 72/40 and HR 36. Pt began feeling SOB and became pale. BP improved to 102/66 en route. Pt does have a port on right side.

## 2024-03-06 NOTE — ED Provider Notes (Signed)
 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  Initial report from EMS was mechanical fall, however when interviewing with the patient it became clear that he had a near syncopal episode following an episode of lightheadedness which he states occurs for him frequently.  He fell, and was too weak to get back up from the ground independently prompting EMS call.  Was found to be hypotensive to the 80s at time of arrival with heart rate in the 60s.  When standing BP dropped further to the 70s and patient became profoundly bradycardic to the low 30s with resulting severe shortness of breath, diaphoresis, and near syncope.  BP and heart rate improved and route.  Patient with known history of PVCs in the past, history of atrial fibrillation not currently anticoagulated due to profound thrombocytopenia in context of his AML with platelets less than 10 on recent labs at [facility]. Patient has hx of TIA, cardiomyopathy, Afib (not anticoagulated at this time), CHF; ICD in place.   HPI     Prior to Admission medications   Medication Sig Start Date End Date Taking? Authorizing Provider  acetaminophen  (TYLENOL ) 500 MG tablet Take 1,000 mg by mouth every 6 (six) hours as needed for mild pain, moderate pain or headache.     [provider]  apixaban  (ELIQUIS ) 5 MG TABS tablet Take 1 tablet by mouth 2 times daily. Patient not taking: Reported on 12/29/2023 04/09/21   Paul Ezra GORMAN, MD  Blood Glucose Monitoring Suppl [facility] VERIO) w/Device KIT Use to check blood sugar 3 times a day. 10/07/18   [name], MD  Continuous Blood Gluc Receiver (FREESTYLE LIBRE 2 READER) DEVI Use daily to monitor blood glucose levels. 03/22/21     Continuous Blood Gluc Sensor (FREESTYLE  LIBRE 2 SENSOR) MISC Use as directed to monitor blood gluse.  Change every 14 days. 11/23/21   [name], MD  glucose blood (ONETOUCH VERIO) test strip Use to check blood sugar 3 times a day. 01/26/21   [name], MD  JARDIANCE  25 MG TABS tablet TAKE 1 TABLET BY MOUTH EVERY DAY 03/02/24   [name], MD  Lancets MISC Glucose monitor to check blood sugars tid dx code E11.9 brand per insurance coverage 07/23/17   [name], MD  losartan  (COZAAR ) 25 MG tablet Take 1 tablet (25 mg total) by mouth daily. NEEDS APPOINTMENT FOR ANYMORE REFILLS Patient not taking: Reported on 12/29/2023 07/23/21   Paul Ezra GORMAN, MD  metFORMIN  (GLUCOPHAGE ) 500 MG tablet Take 1,000 mg by mouth 2 (two) times daily with a meal.    [provider]  metoprolol  succinate (TOPROL  XL) 25 MG 24 hr tablet Take 1 tablet (25 mg total) by mouth in the morning and at bedtime. 12/29/23   Paul Ezra GORMAN, MD  mexiletine (MEXITIL ) 150 MG capsule Take 1 capsule (150 mg total) by mouth 2 (two) times daily. 02/12/24   Paul Ezra GORMAN, MD  rosuvastatin  (CRESTOR ) 20 MG tablet Take 20 mg by mouth daily.    [provider]  spironolactone  (ALDACTONE ) 25 MG tablet Take 1 tablet (25 mg total) by mouth daily. 11/03/23   Paul Ezra GORMAN, MD  tamsulosin  (FLOMAX ) 0.4 MG CAPS capsule Take 0.8 mg by mouth  daily.    [provider]  tirzepatide  (MOUNJARO ) 5 MG/0.5ML Pen Inject 5 mg into the skin once a week. Patient not taking: Reported on 12/29/2023 11/23/21   [name], MD    Allergies: Lisinopril    Review of Systems  Constitutional:  Positive for fatigue.  Respiratory:  Positive for shortness of breath.   Neurological:  Positive for light-headedness.    Updated Vital Signs BP 105/65   Pulse 62   Temp (!) 97.4 F (36.3 C) (Oral)   Resp 16   Ht 5' 10 (1.778 m)   Wt 79.4 kg   SpO2 100%   BMI 25.11 kg/m   Physical Exam Vitals and nursing note reviewed.  Constitutional:       Appearance: He is not ill-appearing or toxic-appearing.  HENT:     Head: Normocephalic and atraumatic.     Mouth/Throat:     Mouth: Mucous membranes are moist.     Pharynx: No oropharyngeal exudate or posterior oropharyngeal erythema.  Eyes:     General:        Right eye: No discharge.        Left eye: No discharge.     Conjunctiva/sclera: Conjunctivae normal.  Cardiovascular:     Rate and Rhythm: Normal rate and regular rhythm.     Pulses: Normal pulses.     Heart sounds: Normal heart sounds. No murmur heard. Pulmonary:     Effort: Pulmonary effort is normal. No respiratory distress.     Breath sounds: Normal breath sounds. No wheezing or rales.  Chest:    Abdominal:     General: Bowel sounds are normal. There is no distension.     Palpations: Abdomen is soft.     Tenderness: There is no abdominal tenderness. There is no guarding or rebound.  Musculoskeletal:        General: No deformity.     Cervical back: Neck supple.     Right lower leg: No edema.     Left lower leg: No edema.  Skin:    General: Skin is warm and dry.     Capillary Refill: Capillary refill takes less than 2 seconds.  Neurological:     General: No focal deficit present.     Mental Status: He is alert and oriented to person, place, and time. Mental status is at baseline.  Psychiatric:        Mood and Affect: Mood normal.     (all labs ordered are listed, but only abnormal results are displayed) Labs Reviewed  COMPREHENSIVE METABOLIC PANEL WITH GFR - Abnormal; Notable for the following components:      Result Value   Sodium 130 (*)    Chloride 94 (*)    Glucose, Bld 200 (*)    BUN 47 (*)    Creatinine, Ser 2.50 (*)    Calcium  7.8 (*)    Total Protein 4.9 (*)    Albumin 2.1 (*)    Alkaline Phosphatase 135 (*)    GFR, Estimated 26 (*)    All other components within normal limits  URINALYSIS, ROUTINE W REFLEX MICROSCOPIC - Abnormal; Notable for the following components:   APPearance HAZY (*)     Glucose, UA >=500 (*)    Bacteria, UA RARE (*)    All other components within normal limits  BRAIN NATRIURETIC PEPTIDE - Abnormal; Notable for the following components:   B Natriuretic Peptide 251.7 (*)    All other components within normal limits  CBC -  Abnormal; Notable for the following components:   WBC 28.8 (*)    RBC 3.11 (*)    Hemoglobin 8.9 (*)    HCT 26.3 (*)    RDW 16.1 (*)    Platelets 6 (*)    All other components within normal limits  CBG MONITORING, ED - Abnormal; Notable for the following components:   Glucose-Capillary 182 (*)    All other components within normal limits  TROPONIN I (HIGH SENSITIVITY) - Abnormal; Notable for the following components:   Troponin I (High Sensitivity) 43 (*)    All other components within normal limits  TROPONIN I (HIGH SENSITIVITY) - Abnormal; Notable for the following components:   Troponin I (High Sensitivity) 46 (*)    All other components within normal limits  HEMOGLOBIN A1C    EKG: EKG Interpretation Date/Time:  Saturday March 06 2024 21:17:58 EDT Ventricular Rate:  72 PR Interval:  168 QRS Duration:  162 QT Interval:  436 QTC Calculation: 478 R Axis:   -61  Text Interpretation: Sinus arrhythmia Ventricular premature complex RBBB and LAFB Confirmed by Griselda Norris [phone]) on 03/06/2024 11:13:00 PM  Radiology: DG Chest Portable 1 View Result Date: 03/06/2024 CLINICAL DATA:  Shortness of breath EXAM: PORTABLE CHEST 1 VIEW COMPARISON:  05/05/2019 FINDINGS: Right Port-A-Cath in place with the tip in the right atrium. Heart and mediastinal contours are within normal limits. No focal opacities or effusions. No acute bony abnormality. IMPRESSION: No active disease. Electronically Signed   By: Franky Crease M.D.   On: 03/06/2024 23:17     Procedures   Medications Ordered in the ED  acetaminophen  (TYLENOL ) tablet 650 mg (has no administration in time range)  polyethylene glycol (MIRALAX  / GLYCOLAX ) packet 17 g (has no  administration in time range)  insulin  aspart (novoLOG ) injection 0-9 Units (has no administration in time range)  insulin  aspart (novoLOG ) injection 0-5 Units (has no administration in time range)  lactated ringers  bolus 1,000 mL (1,000 mLs Intravenous New Bag/Given 03/06/24 2357)    Clinical Course as of 03/07/24 0304  Sun Mar 07, 2024  0145 Consult to Dr. Duffy, cardiologist, who reviewed telemetry of this patient's bradycardic episode. She is requesting repeat orthostatics to try to recapture the bradycardia previously noted. Bedside cardiology consult during medical admission for AKI. I appreciate her collaboration in the care of this patient. [RS]  0215 Consult to Dr. Shona, hospitalist was amenable to me to speak to her service.  I appreciate her collaboration of care with patient. [RS]  0300 Cardiologist Dr. Duffy has further evaluated the patient's case.  Unfortunately he is experiencing abnormal rhythm during the episodes of his symptoms of near syncope, bradycardia.  She we will have electrophysiology see the patient during his hospital stay, however he likely will not be a candidate for pacer placement secondary to his thrombocytopenia <10.  I appreciate her collaboration of care of this patient. [RS]    Clinical Course User Index [RS] Zeke Aker, Pleasant SAUNDERS, PA-C                                 Medical Decision Making  78 year old male with known AML, presyncopal symptoms.  Low normal BPs.  Cardiopulmonary and abdominal exams are benign.  Patient with normal peripheral pulses.  GCS 15.  The differential for syncope is extensive and includes, but is not limited to: arrythmia (Vtach, SVT, SSS, sinus arrest, AV block, bradycardia), aortic stenosis, AMI,  hypertrophic obstructive cardiomyopathy (HOCM), PE, atrial myxoma, pulmonary hypertension, orthostatic hypotension, hypovolemia, drug effect, GB syndrome, micturition, cough, carotid sinus sensitivity, seizure, TIA/CVA, hypoglycemia,  vertigo.   Amount and/or Complexity of Data Reviewed Labs: ordered.    Details: CBC with leukocytosis of 28, thrombocytopenia with platelets of 6, anemia with hemoglobin of 8.9 all near patient's baseline context of AML.  Creatinine of 2.5 increased from patient's baseline of 1.2 this month at [facility].  Troponin mildly elevated to 46, BNP elevated to 50.  UA without evidence of infection. Radiology: ordered.    Details:   Chest x-ray is unremarkable ECG/medicine tests:     Details: EKG as above.  Risk Decision regarding hospitalization.   Patient will require admission to the hospital due to AKI and dysrhythmia prompting near syncopal episodes.  EP to see the patient during hospitalization.  Patient admitted to hospital medicine service as above.  Chyrl voiced understanding of his medical evaluation and treatment plan. Each of their questions answered to their expressed satisfaction.  He is amenable to plan for admission at this time.   This chart was dictated using voice recognition software, Dragon. Despite the best efforts of this provider to proofread and correct errors, errors may still occur which can change documentation meaning.      Final diagnoses:  AKI (acute kidney injury) [facility])  Elevated troponin    ED Discharge Orders     None          Bobette Pleasant JONELLE DEVONNA 03/07/24 0304    Tegeler, Lonni PARAS, MD 03/07/24 [phone]

## 2024-03-07 DIAGNOSIS — I5022 Chronic systolic (congestive) heart failure: Secondary | ICD-10-CM | POA: Diagnosis present

## 2024-03-07 DIAGNOSIS — I472 Ventricular tachycardia, unspecified: Secondary | ICD-10-CM | POA: Diagnosis present

## 2024-03-07 DIAGNOSIS — C9200 Acute myeloblastic leukemia, not having achieved remission: Secondary | ICD-10-CM | POA: Diagnosis present

## 2024-03-07 DIAGNOSIS — N1831 Chronic kidney disease, stage 3a: Secondary | ICD-10-CM | POA: Diagnosis present

## 2024-03-07 DIAGNOSIS — Y92008 Other place in unspecified non-institutional (private) residence as the place of occurrence of the external cause: Secondary | ICD-10-CM | POA: Diagnosis not present

## 2024-03-07 DIAGNOSIS — I420 Dilated cardiomyopathy: Secondary | ICD-10-CM | POA: Diagnosis present

## 2024-03-07 DIAGNOSIS — I951 Orthostatic hypotension: Secondary | ICD-10-CM | POA: Diagnosis present

## 2024-03-07 DIAGNOSIS — Z7901 Long term (current) use of anticoagulants: Secondary | ICD-10-CM | POA: Diagnosis not present

## 2024-03-07 DIAGNOSIS — Z95810 Presence of automatic (implantable) cardiac defibrillator: Secondary | ICD-10-CM | POA: Diagnosis not present

## 2024-03-07 DIAGNOSIS — Z7985 Long-term (current) use of injectable non-insulin antidiabetic drugs: Secondary | ICD-10-CM | POA: Diagnosis not present

## 2024-03-07 DIAGNOSIS — R7989 Other specified abnormal findings of blood chemistry: Secondary | ICD-10-CM | POA: Diagnosis not present

## 2024-03-07 DIAGNOSIS — I2510 Atherosclerotic heart disease of native coronary artery without angina pectoris: Secondary | ICD-10-CM | POA: Diagnosis present

## 2024-03-07 DIAGNOSIS — R531 Weakness: Secondary | ICD-10-CM | POA: Diagnosis present

## 2024-03-07 DIAGNOSIS — Z96653 Presence of artificial knee joint, bilateral: Secondary | ICD-10-CM | POA: Diagnosis present

## 2024-03-07 DIAGNOSIS — Z833 Family history of diabetes mellitus: Secondary | ICD-10-CM | POA: Diagnosis not present

## 2024-03-07 DIAGNOSIS — E876 Hypokalemia: Secondary | ICD-10-CM | POA: Diagnosis present

## 2024-03-07 DIAGNOSIS — Z794 Long term (current) use of insulin: Secondary | ICD-10-CM | POA: Diagnosis not present

## 2024-03-07 DIAGNOSIS — N179 Acute kidney failure, unspecified: Secondary | ICD-10-CM | POA: Diagnosis present

## 2024-03-07 DIAGNOSIS — R001 Bradycardia, unspecified: Secondary | ICD-10-CM

## 2024-03-07 DIAGNOSIS — Z7984 Long term (current) use of oral hypoglycemic drugs: Secondary | ICD-10-CM | POA: Diagnosis not present

## 2024-03-07 DIAGNOSIS — I130 Hypertensive heart and chronic kidney disease with heart failure and stage 1 through stage 4 chronic kidney disease, or unspecified chronic kidney disease: Secondary | ICD-10-CM | POA: Diagnosis present

## 2024-03-07 DIAGNOSIS — D696 Thrombocytopenia, unspecified: Secondary | ICD-10-CM | POA: Diagnosis present

## 2024-03-07 DIAGNOSIS — W010XXA Fall on same level from slipping, tripping and stumbling without subsequent striking against object, initial encounter: Secondary | ICD-10-CM | POA: Diagnosis present

## 2024-03-07 DIAGNOSIS — G901 Familial dysautonomia [Riley-Day]: Secondary | ICD-10-CM | POA: Diagnosis present

## 2024-03-07 DIAGNOSIS — E1122 Type 2 diabetes mellitus with diabetic chronic kidney disease: Secondary | ICD-10-CM | POA: Diagnosis present

## 2024-03-07 DIAGNOSIS — N400 Enlarged prostate without lower urinary tract symptoms: Secondary | ICD-10-CM | POA: Diagnosis present

## 2024-03-07 DIAGNOSIS — I493 Ventricular premature depolarization: Secondary | ICD-10-CM | POA: Diagnosis not present

## 2024-03-07 DIAGNOSIS — Z8249 Family history of ischemic heart disease and other diseases of the circulatory system: Secondary | ICD-10-CM | POA: Diagnosis not present

## 2024-03-07 DIAGNOSIS — K760 Fatty (change of) liver, not elsewhere classified: Secondary | ICD-10-CM | POA: Diagnosis present

## 2024-03-07 DIAGNOSIS — D630 Anemia in neoplastic disease: Secondary | ICD-10-CM | POA: Diagnosis present

## 2024-03-07 DIAGNOSIS — R55 Syncope and collapse: Secondary | ICD-10-CM | POA: Diagnosis not present

## 2024-03-07 LAB — URINALYSIS, ROUTINE W REFLEX MICROSCOPIC
Bilirubin Urine: NEGATIVE
Glucose, UA: >=500 mg/dL — AB
Hgb urine dipstick: NEGATIVE
Ketones, ur: NEGATIVE mg/dL
Leukocytes,Ua: NEGATIVE
Nitrite: NEGATIVE
Protein, ur: NEGATIVE mg/dL
Specific Gravity, Urine: 1.01 (ref 1.005–1.030)
pH: 5 (ref 5.0–8.0)

## 2024-03-07 LAB — CBC
HCT: 26.3 % — ABNORMAL LOW (ref 39.0–52.0)
HCT: 32.5 % — ABNORMAL LOW (ref 39.0–52.0)
Hemoglobin: 8.9 g/dL — ABNORMAL LOW (ref 13.0–17.0)
Hemoglobin: 11.4 g/dL — ABNORMAL LOW (ref 13.0–17.0)
MCH: 28.6 pg (ref 26.0–34.0)
MCH: 29.2 pg (ref 26.0–34.0)
MCHC: 33.8 g/dL (ref 30.0–36.0)
MCHC: 35.1 g/dL (ref 30.0–36.0)
MCV: 84.6 fL (ref 80.0–100.0)
MCV: 83.3 fL (ref 80.0–100.0)
Platelets: 6 K/uL — CL (ref 150–400)
Platelets: 5 K/uL — CL (ref 150–400)
RBC: 3.11 MIL/uL — ABNORMAL LOW (ref 4.22–5.81)
RBC: 3.9 MIL/uL — ABNORMAL LOW (ref 4.22–5.81)
RDW: 16.1 % — ABNORMAL HIGH (ref 11.5–15.5)
RDW: 15.9 % — ABNORMAL HIGH (ref 11.5–15.5)
WBC: 28.8 K/uL — ABNORMAL HIGH (ref 4.0–10.5)
WBC: 24.2 K/uL — ABNORMAL HIGH (ref 4.0–10.5)
nRBC: 0 % (ref 0.0–0.2)
nRBC: 0 % (ref 0.0–0.2)

## 2024-03-07 LAB — GLUCOSE, CAPILLARY
Glucose-Capillary: 206 mg/dL — ABNORMAL HIGH (ref 70–99)
Glucose-Capillary: 219 mg/dL — ABNORMAL HIGH (ref 70–99)
Glucose-Capillary: 173 mg/dL — ABNORMAL HIGH (ref 70–99)
Glucose-Capillary: 179 mg/dL — ABNORMAL HIGH (ref 70–99)

## 2024-03-07 LAB — TROPONIN I (HIGH SENSITIVITY): Troponin I (High Sensitivity): 46 ng/L — ABNORMAL HIGH (ref <18)

## 2024-03-07 LAB — PREPARE RBC (CROSSMATCH)

## 2024-03-07 MED ORDER — ACETAMINOPHEN 325 MG PO TABS: 650.0000 mg | ORAL_TABLET | Freq: Once | ORAL | Status: DC

## 2024-03-07 MED ORDER — METFORMIN HCL 500 MG PO TABS
1000.0000 mg | ORAL_TABLET | Freq: Two times a day (BID) | ORAL | Status: DC
  Administered 2024-03-07 – 2024-03-08 (×3): 1000 mg via ORAL
  Filled 2024-03-07 (×3): qty 2

## 2024-03-07 MED ORDER — SODIUM CHLORIDE 0.9% FLUSH
10.0000 mL | Freq: Two times a day (BID) | INTRAVENOUS | Status: DC
  Administered 2024-03-07: 20 mL
  Administered 2024-03-08: 10 mL
  Administered 2024-03-08: 20 mL
  Administered 2024-03-09: 10 mL
  Administered 2024-03-09: 40 mL
  Administered 2024-03-10: 10 mL

## 2024-03-07 MED ORDER — ACETAMINOPHEN 325 MG PO TABS
650.0000 mg | ORAL_TABLET | Freq: Four times a day (QID) | ORAL | Status: DC | PRN
  Administered 2024-03-07: 650 mg via ORAL
  Filled 2024-03-07: qty 2

## 2024-03-07 MED ORDER — MEXILETINE HCL 150 MG PO CAPS
150.0000 mg | ORAL_CAPSULE | Freq: Two times a day (BID) | ORAL | Status: DC
Start: 2024-03-07 — End: 2024-03-07
  Filled 2024-03-07: qty 1

## 2024-03-07 MED ORDER — FUROSEMIDE 40 MG PO TABS
40.0000 mg | ORAL_TABLET | Freq: Every day | ORAL | Status: DC
  Administered 2024-03-07 – 2024-03-08 (×2): 40 mg via ORAL
  Filled 2024-03-07 (×2): qty 1

## 2024-03-07 MED ORDER — VALACYCLOVIR HCL 500 MG PO TABS
500.0000 mg | ORAL_TABLET | Freq: Every day | ORAL | Status: DC
  Administered 2024-03-07 – 2024-03-10 (×4): 500 mg via ORAL
  Filled 2024-03-07 (×4): qty 1

## 2024-03-07 MED ORDER — CHLORHEXIDINE GLUCONATE CLOTH 2 % EX PADS: 6.0000 | MEDICATED_PAD | Freq: Every day | CUTANEOUS | Status: DC

## 2024-03-07 MED ORDER — FINASTERIDE 5 MG PO TABS
5.0000 mg | ORAL_TABLET | Freq: Every day | ORAL | Status: DC
  Administered 2024-03-07 – 2024-03-10 (×4): 5 mg via ORAL
  Filled 2024-03-07 (×4): qty 1

## 2024-03-07 MED ORDER — INSULIN ASPART 100 UNIT/ML IJ SOLN
0.0000 [IU] | Freq: Three times a day (TID) | INTRAMUSCULAR | Status: DC
  Administered 2024-03-07: 2 [IU] via SUBCUTANEOUS
  Administered 2024-03-07 (×2): 3 [IU] via SUBCUTANEOUS
  Administered 2024-03-08: 1 [IU] via SUBCUTANEOUS
  Administered 2024-03-08 (×2): 2 [IU] via SUBCUTANEOUS
  Administered 2024-03-09: 3 [IU] via SUBCUTANEOUS
  Administered 2024-03-09: 1 [IU] via SUBCUTANEOUS
  Administered 2024-03-09 – 2024-03-10 (×2): 2 [IU] via SUBCUTANEOUS
  Administered 2024-03-10: 3 [IU] via SUBCUTANEOUS

## 2024-03-07 MED ORDER — LORAZEPAM 1 MG PO TABS
1.0000 mg | ORAL_TABLET | Freq: Every day | ORAL | Status: DC
  Administered 2024-03-07 – 2024-03-09 (×3): 1 mg via ORAL
  Filled 2024-03-07 (×3): qty 1

## 2024-03-07 MED ORDER — DIPHENOXYLATE-ATROPINE 2.5-0.025 MG PO TABS: 2.0000 | ORAL_TABLET | ORAL | Status: DC | PRN

## 2024-03-07 MED ORDER — ONDANSETRON 4 MG PO TBDP
8.0000 mg | ORAL_TABLET | Freq: Three times a day (TID) | ORAL | Status: DC
  Administered 2024-03-07: 8 mg via ORAL
  Filled 2024-03-07 (×12): qty 2

## 2024-03-07 MED ORDER — EMPAGLIFLOZIN 25 MG PO TABS
25.0000 mg | ORAL_TABLET | Freq: Every day | ORAL | Status: DC
  Administered 2024-03-07 – 2024-03-08 (×2): 25 mg via ORAL
  Filled 2024-03-07 (×2): qty 1

## 2024-03-07 MED ORDER — VORICONAZOLE 200 MG PO TABS
200.0000 mg | ORAL_TABLET | Freq: Two times a day (BID) | ORAL | Status: DC
  Administered 2024-03-07 – 2024-03-10 (×7): 200 mg via ORAL
  Filled 2024-03-07 (×8): qty 1

## 2024-03-07 MED ORDER — FUROSEMIDE 10 MG/ML IJ SOLN: 20.0000 mg | Freq: Once | INTRAMUSCULAR | Status: DC

## 2024-03-07 MED ORDER — TERBINAFINE HCL 250 MG PO TABS
250.0000 mg | ORAL_TABLET | Freq: Every day | ORAL | Status: DC
  Administered 2024-03-07 – 2024-03-10 (×4): 250 mg via ORAL
  Filled 2024-03-07 (×4): qty 1

## 2024-03-07 MED ORDER — MAGNESIUM OXIDE -MG SUPPLEMENT 400 (240 MG) MG PO TABS
400.0000 mg | ORAL_TABLET | Freq: Every day | ORAL | Status: DC
  Administered 2024-03-07 – 2024-03-10 (×4): 400 mg via ORAL
  Filled 2024-03-07 (×4): qty 1

## 2024-03-07 MED ORDER — SODIUM CHLORIDE 0.9% IV SOLUTION: Freq: Once | INTRAVENOUS | Status: AC

## 2024-03-07 MED ORDER — INSULIN ASPART 100 UNIT/ML IJ SOLN: 0.0000 [IU] | Freq: Every day | INTRAMUSCULAR | Status: DC

## 2024-03-07 MED ORDER — SODIUM CHLORIDE 0.9% FLUSH: 10.0000 mL | INTRAVENOUS | Status: DC | PRN

## 2024-03-07 MED ORDER — TAMSULOSIN HCL 0.4 MG PO CAPS
0.8000 mg | ORAL_CAPSULE | Freq: Every day | ORAL | Status: DC
  Administered 2024-03-07 – 2024-03-09 (×3): 0.8 mg via ORAL
  Filled 2024-03-07 (×3): qty 2

## 2024-03-07 MED ORDER — HYDROXYUREA 500 MG PO CAPS
1000.0000 mg | ORAL_CAPSULE | Freq: Two times a day (BID) | ORAL | Status: DC
  Administered 2024-03-07 – 2024-03-08 (×3): 1000 mg via ORAL
  Filled 2024-03-07 (×4): qty 2

## 2024-03-07 MED ORDER — FUROSEMIDE 10 MG/ML IJ SOLN
20.0000 mg | Freq: Once | INTRAMUSCULAR | Status: DC
  Filled 2024-03-07: qty 2

## 2024-03-07 MED ORDER — POLYETHYLENE GLYCOL 3350 17 G PO PACK
17.0000 g | PACK | ORAL | Status: DC
  Administered 2024-03-08: 17 g via ORAL
  Filled 2024-03-07 (×2): qty 1

## 2024-03-07 MED ORDER — CEFUROXIME AXETIL 250 MG PO TABS
250.0000 mg | ORAL_TABLET | Freq: Two times a day (BID) | ORAL | Status: DC
  Administered 2024-03-07 – 2024-03-10 (×7): 250 mg via ORAL
  Filled 2024-03-07 (×9): qty 1

## 2024-03-07 MED ORDER — MEXILETINE HCL 200 MG PO CAPS
200.0000 mg | ORAL_CAPSULE | Freq: Two times a day (BID) | ORAL | Status: DC
  Administered 2024-03-07 – 2024-03-10 (×7): 200 mg via ORAL
  Filled 2024-03-07 (×8): qty 1

## 2024-03-07 MED ORDER — POLYETHYLENE GLYCOL 3350 17 G PO PACK
17.0000 g | PACK | Freq: Every day | ORAL | Status: DC | PRN
  Administered 2024-03-09: 17 g via ORAL
  Filled 2024-03-07 (×2): qty 1

## 2024-03-07 MED ORDER — ACETAMINOPHEN 500 MG PO TABS
1000.0000 mg | ORAL_TABLET | Freq: Four times a day (QID) | ORAL | Status: DC | PRN
  Administered 2024-03-07 – 2024-03-10 (×9): 1000 mg via ORAL
  Filled 2024-03-07 (×9): qty 2

## 2024-03-07 NOTE — Consult Note (Signed)
 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 He noticed yesterday that when he would stand up he would get lightheaded.He was walking yesterday and fell on the ground. He did not loose consciousness. He was not able to get up. EMS came and his HR dropped to 30s.  While in the ED, he stood up and dropped into a junctional rhythm.   [name] was diagnosed with a nonischemic cardiomyopathy around 2011.  At that time his EF was 25% so he received a ICD. His ICD was later removed after his EF recovered.  In 2016 he began to have frequent PVCs with around 30% PVC burden. The VT ablation was not successful thus he is began taking mexiletine. He began to have more PVCs and  was referred to [facility] for repeat VT ablation. During his workup he was found to have 90% distal LAD and 90% PDA lesion to which she received PCI's to both. He had a VT ablation 1 year later ( after 12 months of plavix ). His last echo in July 2025 showed an EF of 45 to 50% with a normal RV and mildly decreased RV systolic function. He last saw Dr. McClain in July 2025 and during his office visit he was found to be in multifocal atrial tachycardia. His metoprolol  was increased to 25 mg twice daily and a 2-week ambulatory EKG monitor was obtained to assess the rhythm. His 14-day monitor showed 580 runs of NSVT, 3018 SVT runs and a 10% PVC burden. Mexiletine 150 mg twice  daily.  Reguarding his hematologic history, in December 2024 he had shortness of breath that was found to be due to anemia. Bone marrow biopsy revealed MDS with 5 to 7% blasts.  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 He is currently on a clinical trial.  Past Medical History:  Diagnosis Date   Aftercare following removal/replacement defibrillator    a. defibrillator removed 03/2019.   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 03/06/2015   NICM (nonischemic cardiomyopathy) (HCC)    Sleep apnea    cpap   Type II diabetes mellitus (HCC)    Ventricular tachycardia (HCC)    observed during  stress testing, subsequent VT on EPS   Wears glasses     Past Surgical History:  Procedure Laterality Date   ABLATION  06-01-2013   PVC's ablated along basal inferoseptal RV by Dr Kelsie   APPENDECTOMY  1969   CARDIAC CATHETERIZATION  04/2010   CARDIAC DEFIBRILLATOR PLACEMENT  05/09/2010   MDT ICD implanted in [place]  by Dr Junette   CATARACT EXTRACTION W/ INTRAOCULAR LENS  IMPLANT, BILATERAL Bilateral    ICD LEAD REMOVAL N/A 03/19/2019   Procedure: ICD LEAD AND GENERATOR REMOVAL;  Surgeon: Waddell Danelle ORN, MD;  Location: [facility] OR;  Service: Cardiovascular;  Laterality: N/A;   KNEE ARTHROSCOPY Left 1984   LUMBAR LAMINECTOMY  ~ 2004   TONSILLECTOMY  AND ADENOIDECTOMY  1972   TOTAL KNEE ARTHROPLASTY Left 04/28/2017   TOTAL KNEE ARTHROPLASTY Left 04/28/2017   Procedure: TOTAL KNEE ARTHROPLASTY LEFT;  Surgeon: Yvone Rush, MD;  Location: [facility] OR;  Service: Orthopedics;  Laterality: Left;   TOTAL KNEE ARTHROPLASTY Right 05/07/2019   Procedure: TOTAL KNEE ARTHROPLASTY;  Surgeon: Yvone Rush, MD;  Location: [facility] ORS;  Service: Orthopedics;  Laterality: Right;   V-TACH ABLATION N/A 06/01/2013   Procedure: V-TACH ABLATION;  Surgeon: Lynwood JONETTA Kelsie, MD;  Location: [facility] CATH LAB;  Service: Cardiovascular;  Laterality: N/A;   VASECTOMY     VENTRICULAR ABLATION SURGERY  06/01/2013     Medications Prior to Admission: Prior to Admission medications   Medication Sig Start Date End Date Taking? Authorizing Provider  acetaminophen  (TYLENOL ) 500 MG tablet Take 1,000 mg by mouth every 6 (six) hours as needed for mild pain, moderate pain or headache.     [provider]  apixaban  (ELIQUIS ) 5 MG TABS tablet Take 1 tablet by mouth 2 times daily. Patient not taking: Reported on 12/29/2023 04/09/21   Rolan Ezra RAMAN, MD  Blood Glucose Monitoring Suppl [facility] VERIO) w/Device KIT Use to check blood sugar 3 times a day. 10/07/18   [name], MD  Continuous Blood Gluc Receiver (FREESTYLE LIBRE 2 READER) DEVI Use daily to monitor blood glucose levels. 03/22/21     Continuous Blood Gluc Sensor (FREESTYLE LIBRE 2 SENSOR) MISC Use as directed to monitor blood gluse.  Change every 14 days. 11/23/21   [name], MD  glucose blood (ONETOUCH VERIO) test strip Use to check blood sugar 3 times a day. 01/26/21   [name], MD  JARDIANCE  25 MG TABS tablet TAKE 1 TABLET BY MOUTH EVERY DAY 03/02/24   [name], MD  Lancets MISC Glucose monitor to check blood sugars tid dx code E11.9 brand per insurance coverage 07/23/17   [name], MD  losartan  (COZAAR ) 25 MG tablet Take 1 tablet (25 mg total) by mouth daily. NEEDS APPOINTMENT FOR ANYMORE  REFILLS Patient not taking: Reported on 12/29/2023 07/23/21   Rolan Ezra RAMAN, MD  metFORMIN  (GLUCOPHAGE ) 500 MG tablet Take 1,000 mg by mouth 2 (two) times daily with a meal.    [provider]  metoprolol  succinate (TOPROL  XL) 25 MG 24 hr tablet Take 1 tablet (25 mg total) by mouth in the morning and at bedtime. 12/29/23   Rolan Ezra RAMAN, MD  mexiletine (MEXITIL ) 150 MG capsule Take 1 capsule (150 mg total) by mouth 2 (two) times daily. 02/12/24   Rolan Ezra RAMAN, MD  rosuvastatin  (CRESTOR ) 20 MG tablet Take 20 mg by mouth daily.    [provider]  spironolactone  (ALDACTONE ) 25 MG tablet Take 1 tablet (25 mg total) by mouth daily.  11/03/23   Rolan Ezra RAMAN, MD  tamsulosin  (FLOMAX ) 0.4 MG CAPS capsule Take 0.8 mg by mouth daily.    [provider]  tirzepatide  (MOUNJARO ) 5 MG/0.5ML Pen Inject 5 mg into the skin once a week. Patient not taking: Reported on 12/29/2023 11/23/21   [name], MD     Allergies:    Allergies  Allergen Reactions   Lisinopril Shortness Of Breath, Swelling and Other (See Comments)    Angioedema     Social History:   Social History   Socioeconomic History   Marital status: Married    Spouse name: Not on file   Number of children: Not on file   Years of education: Not on file   Highest education level: Not on file  Occupational History   Occupation: Occupational Medicine Physician  Tobacco Use   Smoking status: Never   Smokeless tobacco: Never  Vaping Use   Vaping status: Never Used  Substance and Sexual Activity   Alcohol  use: Yes    Comment: social   Drug use: No   Sexual activity: Yes  Other Topics Concern   Not on file  Social History Narrative   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    Moved here from [place]  1.5 years ago   Social Drivers of [employer]: Low Risk  (01/23/2024)   Received from [facility]   Overall  Financial Resource Strain (CARDIA)    How hard is it for you to pay for the very basics like food, housing, medical care, and heating?: Not hard at all  Food Insecurity: No Food Insecurity (01/23/2024)   Received [facility]   Hunger Vital Sign    Within the past 12 months, you worried that your food would run out before you got the money to buy more.: Never true    Within the past 12 months, the food you bought just didn't last and you didn't have money to get more.: Never true  Transportation Needs: No Transportation Needs (01/23/2024)   Received from [facility] - Transportation    In the past 12 months, has lack of transportation kept you from medical appointments or from getting medications?: No    In the past 12 months, has lack of transportation kept you from meetings, work, or from getting things needed for daily living?: No  Physical Activity: Inactive (01/23/2024)   Received from [facility]   Exercise Vital Sign    On average, how many days per week do you engage in moderate to strenuous exercise (like a brisk walk)?: 0 days    Minutes of Exercise per Session: Not on file  Stress: No Stress Concern Present (01/23/2024)   Received from [facility] - Occupational Stress Questionnaire    Do you feel stress - tense, restless, nervous, or anxious, or unable to sleep at night because your mind is troubled all the time - these days?: Not at all  Social Connections: Socially Integrated (01/23/2024)   Received from [facility]   Social Network    How would you rate your social network (family, work, friends)?: Good participation with social networks  Intimate Partner Violence: Not At Risk (01/23/2024)   Received from [facility]   HITS    Over the last 12 months how often did your partner physically hurt you?: Never    Over the last  12 months how often did your partner insult you or talk down to you?: Never    Over the last 12  months how often did your partner threaten you with physical harm?: Never    Over the last 12 months how often did your partner scream or curse at you?: Never    Family History:   The patient's family history includes Alcohol  abuse in his brother and mother; COPD in his father and mother; Cancer in his father; Coronary artery disease in his brother; Depression in his mother; Diabetes in his sister; Drug abuse in his brother; Heart disease in his brother, father, and mother; Hypertension in his brother, father, mother, and sister; Learning disabilities in his sister; Mental retardation in his sister; Stroke in his father and mother. There is no history of Colon cancer, Pancreatic cancer, Esophageal cancer, Stomach cancer, Liver disease, or Rectal cancer.     Physical Exam/Data:   Vitals:   03/06/24 2115 03/06/24 2119 03/06/24 2122 03/06/24 2330  BP: 98/68 99/66  105/65  Pulse: 78 77  62  Resp:  19  16  Temp:   (!) 97.3 F (36.3 C)   TempSrc:   Oral   SpO2: 100% 100%  100%  Weight:      Height:       No intake or output data in the 24 hours ending 03/07/24 0225 Filed Weights   03/06/24 2113  Weight: 79.4 kg   Body mass index is 25.11 kg/m.  General:  Well nourished, well developed, in no acute distress HEENT: normal Neck: no JVD Vascular: No carotid bruits; FA pulses 2+ bilaterally without bruits  Cardiac:  normal S1, S2; Lungs:  clear to auscultation bilaterally, no wheezing, rhonchi or rales  Abd: soft, nontender, no hepatomegaly  Ext: + non-pitting edema  Musculoskeletal:  No deformities Skin: warm and dry  Psych:  Normal affect    EKG:  EKG obtained at 03/06/2024 at 21:17 PM: sinus arrhythmia with first-degree AV block. Right bundle branch block present PVC present with compensatory pause  EKG on December 29, 2023.  Multifocal atrial tachycardia with PVC present and PAC present.  Laboratory Data:  Component     Latest Ref Rng 03/06/2024  WBC     4.0 - 10.5 K/uL 28.8 (H)    RBC     4.22 - 5.81 MIL/uL 3.11 (L)   Hemoglobin     13.0 - 17.0 g/dL 8.9 (L)   HCT     60.9 - 52.0 % 26.3 (L)   MCV     80.0 - 100.0 fL 84.6   MCH     26.0 - 34.0 pg 28.6   MCHC     30.0 - 36.0 g/dL 66.1   RDW     88.4 - 84.4 % 16.1 (H)   Platelets     150 - 400 K/uL 6 (LL)     Component     Latest Ref Rng 03/06/2024  Glucose     70 - 99 mg/dL 799 (H)   BUN     8 - 23 mg/dL 47 (H)   Creatinine     0.61 - 1.24 mg/dL 7.49 (H)   Sodium     864 - 145 mmol/L 130 (L)   Potassium     3.5 - 5.1 mmol/L 4.2   Chloride     98 - 111 mmol/L 94 (L)   CO2     22 - 32 mmol/L 23   Calcium   8.9 - 10.3 mg/dL 7.8 (L)   Total Protein     6.5 - 8.1 g/dL 4.9 (L)   Albumin     3.5 - 5.0 g/dL 2.1 (L)   AST     15 - 41 U/L 27   ALT     0 - 44 U/L 28   Alkaline Phosphatase     38 - 126 U/L 135 (H)   Total Bilirubin     0.0 - 1.2 mg/dL 0.8   Anion gap     5 - 15  13   GFR, Estimated     >60 mL/min 26 (L)     Component     Latest Ref Rng 03/06/2024  B Natriuretic Peptide     0.0 - 100.0 pg/mL 251.7 (H)     Component     Latest Ref Rng 03/06/2024 03/07/2024  Troponin I (High Sensitivity)     <18 ng/L 43 (H)  46 (H)     Radiology/Studies:  DG Chest Portable 1 View Result Date: 03/06/2024 CLINICAL DATA:  Shortness of breath EXAM: PORTABLE CHEST 1 VIEW COMPARISON:  05/05/2019 FINDINGS: Right Port-A-Cath in place with the tip in the right atrium. Heart and mediastinal contours are within normal limits. No focal opacities or effusions. No acute bony abnormality. IMPRESSION: No active disease. Electronically Signed   By: Franky Crease M.D.   On: 03/06/2024 23:17    Assessment and Plan:  Mr. Kalas is a 78 year old male who presents with presyncope upon standing to which he has a bradyarrhythmia concerning for a junctional rhythm that resolves upon laying down. Upon reviewing his chart Mr. Sontag has had multiple arrhythmias from PVCs with 2 prior PVC ablations, multifocal atrial  tachycardia, and now these bradycardia arrhythmias. Mexiletine itself can cause lightheadedness as a side effect however I am concerned that his presents with a bradyarrhythmia, thus suggesting perhaps a different pathology. The metoprolol  succinate was increased 2 months prior. I am concerned that if we decrease/ hold the metoprolol  that it may worsen his other arrhthymias. 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 Given his complex arrhythmias would recommend evaluation with EP.   - Discuss with EP  For questions or updates, please contact [facility] Please consult [email or web address] for contact info under        Signed, Merlene JAYSON Blood, MD  03/07/2024 2:25 AM   Merlene Blood, MD MS  Cardiology [place]

## 2024-03-07 NOTE — Plan of Care (Signed)

## 2024-03-07 NOTE — Progress Notes (Signed)
 Called [phone] [facility] consult line Evidence seems to indicate no pro-arrythmogenic potenttial of this agent  Study Details  [id number]  A Study of [id number] in Relapsed/Refractory AML/ ALL With or Without MLL Rearrangement or NPM1 Mutation  ClinicalTrials.gov   Phase 1 Results: First-in-Human Phase 1/2 Study of the Menin-MLL Inhibitor Enzomenib ([id number]) in Patients with Relapsed or Refractory Acute Leukemia  Blood  American Society of Hematology    phase 1 portion, the enzomenib dose was escalated from 40 mg BID to 300 mg BID (n=81) with no dose limiting toxicities (DLTs) observed. Treatment emergent adverse events (TEAEs) assessed as related to enzomenib in >=10% of pts was vomiting (14.8%), nausea (13.6%); grade 3 nausea and vomiting were reported in 1 pt. TEAE in >= 20% of pts regardless of relationship included nausea (39.5%), vomiting (29.6%), febrile neutropenia, diarrhea, hypokalaemia (22.2% each), decreased appetite, and headache (21.0% each). There were no reports of grade > 3 QTc prolongation related to enzomenib; grade 1 QTc prolongation was reported in 2 pts (2.5%) and grade 2 in 2 pts (2.5%). Possible differentiation syndrome (DS) was reported in 9 patients (11.1%) with no deaths from DS, and no treatment-related deaths   Paged to Hematology to discuss the TAGALONG trial and  Enzomenib ([id number]) as well as toxicities etc. as we have to hold the study med as it is not available here   Awaiting call back.   Jai Liston Thum, MD [facility] 8:39 AM

## 2024-03-07 NOTE — Consult Note (Signed)
 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  He was getting takeout from [employer].  He got up out of his wife's car and had an episode of severe shortness of breath.  He does get shortness of breath at baseline that he attributes to his leukemia and anemia.  This shortness of breath was worse than usual.  He stumbled and fell to the ground.  EMS was called as he could not get back up.  He thinks that his fall was related to his acute shortness of breath.  He does not think that it was a necessarily mechanical fall and he is unsure if he lost consciousness.  EMS performed orthostatics and noted that his heart rate went into the 30s.  His metoprolol  dose was recently increased.  He previously wore a cardiac monitor that showed multiple runs of VT and SVT and a 10% PVC burden.  Mexiletine was also added to his medication list.   Past Medical History:  Diagnosis Date   Aftercare following removal/replacement defibrillator    a. defibrillator removed 03/2019.   Arthritis    thumbs, neck, knees (06/01/2013)   Cardiomyopathy, dilated, nonischemic (HCC) 10/20/2012   Cataract    Chronic systolic heart failure (HCC)    CKD (chronic kidney disease), stage III (HCC)    Coronary artery disease    stent placed   DVT (deep vein thrombosis) in  pregnancy    Dysrhythmia    trigemini for 10 years as per pt.   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 03/06/2015   NICM (nonischemic cardiomyopathy) (HCC)    Sleep apnea    cpap   Type II diabetes mellitus (HCC)    Ventricular tachycardia (HCC)    observed during stress testing, subsequent VT on EPS   Wears glasses     Past Surgical History:  Procedure Laterality Date   ABLATION  06-01-2013   PVC's ablated along basal inferoseptal RV by Dr Kelsie   APPENDECTOMY  1969   CARDIAC CATHETERIZATION  04/2010   CARDIAC DEFIBRILLATOR PLACEMENT  05/09/2010   MDT ICD implanted in [place] by Dr Junette   CATARACT EXTRACTION W/ INTRAOCULAR LENS  IMPLANT, BILATERAL Bilateral    ICD LEAD REMOVAL N/A 03/19/2019   Procedure: ICD LEAD AND GENERATOR REMOVAL;  Surgeon: Waddell Danelle ORN, MD;  Location[facility];  Service: Cardiovascular;  Laterality: N/A;   KNEE ARTHROSCOPY Left 1984   LUMBAR LAMINECTOMY  ~ 2004   TONSILLECTOMY AND ADENOIDECTOMY  1972   TOTAL KNEE ARTHROPLASTY Left 04/28/2017   TOTAL KNEE ARTHROPLASTY Left 04/28/2017   Procedure: TOTAL KNEE  ARTHROPLASTY LEFT;  Surgeon: Yvone Rush, MD;  Location: [facility] OR;  Service: Orthopedics;  Laterality: Left;   TOTAL KNEE ARTHROPLASTY Right 05/07/2019   Procedure: TOTAL KNEE ARTHROPLASTY;  Surgeon: Yvone Rush, MD;  Location: [facility] ORS;  Service: Orthopedics;  Laterality: Right;   V-TACH ABLATION N/A 06/01/2013   Procedure: V-TACH ABLATION;  Surgeon: Lynwood JONETTA Rakers, MD;  Location: [facility] CATH LAB;  Service: Cardiovascular;  Laterality: N/A;   VASECTOMY     VENTRICULAR ABLATION SURGERY  06/01/2013     Home Medications:  Prior to Admission medications   Medication Sig Start Date End Date Taking? Authorizing Provider  acetaminophen  (TYLENOL )  500 MG tablet Take 1,000 mg by mouth every 6 (six) hours as needed for mild pain, moderate pain or headache.    Yes [provider]  cefdinir (OMNICEF) 300 MG capsule Take 300 mg by mouth 2 (two) times daily.   Yes [provider]  diphenoxylate -atropine  (LOMOTIL ) 2.5-0.025 MG tablet Take 2 tablets by mouth as needed for diarrhea or loose stools. 08/27/23  Yes [provider]  finasteride  (PROSCAR ) 5 MG tablet Take 5 mg by mouth daily. 01/16/24  Yes [provider]  furosemide  (LASIX ) 40 MG tablet Take 40 mg by mouth daily. 12/27/23  Yes [provider]  hydroxyurea  (HYDREA ) 500 MG capsule Take 1,000 mg by mouth 2 (two) times daily. 03/05/24 05/04/24 Yes [provider]  insulin  glargine (LANTUS  SOLOSTAR) 100 UNIT/ML Solostar Pen Inject 10 Units into the skin in the morning.   Yes [provider]  Investigational - Study Medication Take 300 mg by mouth 2 (two) times daily. Study name: Dulib [id number] 100mg  tablet Additional study details: Acute myeloid leukemia not having achieved remission  Take 3 tablets (300mg ) by mouth two time a day for 28 days, under fasted or fed conditions, with up to or 8oz of water .   Yes [provider]  JARDIANCE  25 MG TABS tablet TAKE 1 TABLET BY MOUTH EVERY DAY 03/02/24  Yes [name], MD  LORazepam  (ATIVAN ) 1 MG tablet Take 1 mg by mouth at bedtime. 02/02/24  Yes [provider]  magnesium  oxide (MAG-OX) 400 (240 Mg) MG tablet Take 1 tablet by mouth daily. 02/21/24  Yes [provider]  metFORMIN  (GLUCOPHAGE ) 500 MG tablet Take 1,000 mg by mouth 2 (two) times daily with a meal.   Yes [provider]  metoprolol  succinate (TOPROL  XL) 25 MG 24 hr tablet Take 1 tablet (25 mg total) by mouth in the morning and at bedtime. 12/29/23  Yes Rolan Ezra RAMAN, MD  mexiletine (MEXITIL ) 150 MG capsule Take 1 capsule (150 mg total) by mouth 2 (two) times daily. 02/12/24  Yes  Rolan Ezra RAMAN, MD  ondansetron  (ZOFRAN -ODT) 8 MG disintegrating tablet Take 8 mg by mouth 3 (three) times daily. 09/18/23  Yes [provider]  polyethylene glycol (MIRALAX  / GLYCOLAX ) 17 g packet Take 17 g by mouth 3 (three) times a week. 08/13/23  Yes [provider]  rosuvastatin  (CRESTOR ) 20 MG tablet Take 20 mg by mouth at bedtime.   Yes [provider]  spironolactone  (ALDACTONE ) 25 MG tablet Take 1 tablet (25 mg total) by mouth daily. 11/03/23  Yes Rolan Ezra RAMAN, MD  tamsulosin  (FLOMAX ) 0.4 MG CAPS capsule Take 0.8 mg by mouth at bedtime.   Yes [provider]  terbinafine  (LAMISIL ) 250 MG tablet Take 250 mg by mouth daily. 02/14/24  Yes [provider]  TRESIBA FLEXTOUCH 100 UNIT/ML FlexTouch  Pen Inject 5 Units into the skin daily. 02/21/24  Yes [provider]  valACYclovir  (VALTREX ) 500 MG tablet Take 500 mg by mouth daily.   Yes [provider]  voriconazole  (VFEND ) 200 MG tablet Take 200 mg by mouth 2 (two) times daily. 02/09/24  Yes [provider]    Scheduled Meds:  sodium chloride    Intravenous Once   cefUROXime   250 mg Oral BID WC   empagliflozin   25 mg Oral Daily   finasteride   5 mg Oral Daily   furosemide   40 mg Oral Daily   hydroxyurea   1,000 mg Oral BID WC   insulin  aspart  0-5 Units Subcutaneous QHS   insulin  aspart  0-9 Units Subcutaneous TID WC   LORazepam   1 mg Oral QHS   magnesium  oxide  400 mg Oral Daily   metFORMIN   1,000 mg Oral BID WC   mexiletine  150 mg Oral BID   ondansetron   8 mg Oral TID   [START ON 03/08/2024] polyethylene glycol  17 g Oral Once per day on Monday Wednesday Friday   tamsulosin   0.8 mg Oral QHS   terbinafine   250 mg Oral Daily   valACYclovir   500 mg Oral Daily   voriconazole   200 mg Oral BID   Continuous Infusions:  PRN Meds: acetaminophen , diphenoxylate -atropine , polyethylene glycol  Allergies:    Allergies  Allergen Reactions   Lisinopril Other (See  Comments), Shortness Of Breath, Swelling and Anaphylaxis    Angioedema  Angioedema    Angioedema    Social History:   Social History   Socioeconomic History   Marital status: Married    Spouse name: Not on file   Number of children: Not on file   Years of education: Not on file   Highest education level: Not on file  Occupational History   Occupation: Occupational Medicine Physician  Tobacco Use   Smoking status: Never   Smokeless tobacco: Never  Vaping Use   Vaping status: Never Used  Substance and Sexual Activity   Alcohol  use: Yes    Comment: social   Drug use: No   Sexual activity: Yes  Other Topics Concern   Not on file  Social History Narrative   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    Moved here from [place] 1.5 years ago   Social Drivers of [employer]: Low Risk  (01/23/2024)   Received from [facility]   Overall Financial Resource Strain (CARDIA)    How hard is it for you to pay for the very basics like food, housing, medical care, and heating?: Not hard at all  Food Insecurity: No Food Insecurity (03/07/2024)   Hunger Vital Sign    Worried About Running Out of Food in the Last Year: Never true    Ran Out of Food in the Last Year: Never true  Transportation Needs: No Transportation Needs (03/07/2024)   PRAPARE - Administrator, Civil Service (Medical): No    Lack of Transportation (Non-Medical): No  Physical Activity: Inactive (01/23/2024)   Received from [facility]   Exercise Vital Sign    On average, how many days per week do you engage in moderate to strenuous exercise (like a brisk walk)?: 0 days    Minutes of Exercise per Session: Not on file  Stress: No Stress Concern Present (01/23/2024)   Received from [facility] - Occupational Stress Questionnaire    Do you feel stress - tense, restless, nervous, or  anxious, or unable to sleep at night because your mind is troubled all the time - these days?: Not at all  Social Connections: Socially Integrated (03/07/2024)   Social Connection and Isolation Panel    Frequency of Communication with Friends and Family: More than three times a week    Frequency of Social Gatherings with Friends and Family: Not on file    Attends Religious Services: More than 4 times per year    Active Member of [employer] or Organizations: Yes    Attends Banker Meetings: Never    Marital Status: Married  Catering manager Violence: Not At Risk (03/07/2024)   Humiliation, Afraid, Rape, and Kick questionnaire    Fear of Current or Ex-Partner: No    Emotionally Abused: No    Physically Abused: No    Sexually Abused: No    Family History:    Family History  Problem Relation Age of Onset   Alcohol  abuse Mother    COPD Mother    Depression Mother    Heart disease Mother    Hypertension Mother    Stroke Mother    COPD Father    Heart disease Father    Hypertension Father    Stroke Father    Cancer Father    Diabetes Sister    Mental retardation Sister    Learning disabilities Sister    Hypertension Sister    Alcohol  abuse Brother    Drug abuse Brother    Heart disease Brother    Hypertension Brother    Coronary artery disease Brother    Colon cancer Neg Hx    Pancreatic cancer Neg Hx    Esophageal cancer Neg Hx    Stomach cancer Neg Hx    Liver disease Neg Hx    Rectal cancer Neg Hx      ROS:  Please see the history of present illness.   All other ROS reviewed and negative.     Physical Exam/Data: Vitals:   03/07/24 0300 03/07/24 0354 03/07/24 0820 03/07/24 0824  BP: 101/62 106/74  116/71  Pulse: 74 78  64  Resp: 18 18  (!) 21  Temp:  98.1 F (36.7 C) (!) 97.5 F (36.4 C)   TempSrc:  Oral Oral   SpO2: 98% 98%  96%  Weight:  86.9 kg    Height:        Intake/Output Summary (Last 24 hours) at 03/07/2024 0941 Last data filed at  03/07/2024 9176 Gross per 24 hour  Intake 1240 ml  Output 750 ml  Net 490 ml      03/07/2024    3:54 AM 03/06/2024    9:13 PM 12/29/2023    2:33 PM  Last 3 Weights  Weight (lbs) 191 lb 9.3 oz 175 lb 177 lb 3.2 oz  Weight (kg) 86.9 kg 79.379 kg 80.377 kg     Body mass index is 27.49 kg/m.  General:  Well nourished, well developed, in no acute distress HEENT: normal Neck: no JVD Vascular: No carotid bruits; Distal pulses 2+ bilaterally Cardiac: Irregular, no murmurs Lungs:  clear to auscultation bilaterally, no wheezing, rhonchi or rales  Abd: soft, nontender, no hepatomegaly  Ext: no edema Musculoskeletal:  No deformities, BUE and BLE strength normal and equal Skin: warm and dry  Neuro:  CNs 2-12 intact, no focal abnormalities noted Psych:  Normal affect   EKG:  The EKG was personally reviewed and demonstrates: Sinus rhythm with PVCs and PACs Telemetry:  Telemetry was personally reviewed and demonstrates: Sinus rhythm with PACs and PVCs  Relevant CV Studies: TTE 12/30/2023  1. Left ventricular ejection fraction, by estimation, is 45 to 50%. The  left ventricle has mildly decreased function. The left ventricle  demonstrates global hypokinesis. There is mild left ventricular  hypertrophy. Indeterminate diastolic filling due to  E-A fusion.   2. Right ventricular systolic function is mildly reduced. The right  ventricular size is normal. There is normal pulmonary artery systolic  pressure. The estimated right ventricular systolic pressure is 29.4 mmHg.   3. The mitral valve is normal in structure. Trivial mitral valve  regurgitation. No evidence of mitral stenosis.   4. The aortic valve is tricuspid. There is mild thickening of the aortic  valve. Aortic valve regurgitation is trivial. No aortic stenosis is  present.   5. Aortic dilatation noted. There is mild dilatation of the ascending  aorta, measuring 41 mm.   6. The inferior vena cava is normal in size with greater than  50%  respiratory variability, suggesting right atrial pressure of 3 mmHg.   Laboratory Data: High Sensitivity Troponin:   Recent Labs  Lab 03/06/24 2250 03/07/24 0033  TROPONINIHS 43* 46*     Chemistry Recent Labs  Lab 03/06/24 2250  NA 130*  K 4.2  CL 94*  CO2 23  GLUCOSE 200*  BUN 47*  CREATININE 2.50*  CALCIUM  7.8*  GFRNONAA 26*  ANIONGAP 13    Recent Labs  Lab 03/06/24 2250  PROT 4.9*  ALBUMIN 2.1*  AST 27  ALT 28  ALKPHOS 135*  BILITOT 0.8   Lipids No results for input(s): CHOL, TRIG, HDL, LABVLDL, LDLCALC, CHOLHDL in the last 168 hours.  Hematology Recent Labs  Lab 03/06/24 2352  WBC 28.8*  RBC 3.11*  HGB 8.9*  HCT 26.3*  MCV 84.6  MCH 28.6  MCHC 33.8  RDW 16.1*  PLT 6*   Thyroid  No results for input(s): TSH, FREET4 in the last 168 hours.  BNP Recent Labs  Lab 03/06/24 2250  BNP 251.7*    DDimer No results for input(s): DDIMER in the last 168 hours.  Radiology/Studies:  DG Chest Portable 1 View Result Date: 03/06/2024 CLINICAL DATA:  Shortness of breath EXAM: PORTABLE CHEST 1 VIEW COMPARISON:  05/05/2019 FINDINGS: Right Port-A-Cath in place with the tip in the right atrium. Heart and mediastinal contours are within normal limits. No focal opacities or effusions. No acute bony abnormality. IMPRESSION: No active disease. Electronically Signed   By: Franky Crease M.D.   On: 03/06/2024 23:17     Assessment and Plan: Bradycardia: Patient has reports of bradycardia noted via EMS.  He also had heart rates in the 30s in the emergency room, telemetry strips in media tab.  He had significant shortness of breath with his episode.  His heart rates were reportedly in the 30s.  For now, would hold his metoprolol .  His platelets are significantly reduced, and avoidance of pacemaker implant would be ideal.  Dorie Ohms ambulate the patient today.  If he can ambulate and has symptoms consistent with his baseline symptoms of shortness of breath, no  further EP workup is necessary. PACs/PVCs: Currently on mexiletine.  With holding metoprolol , Legaci Tarman increase mexiletine to 200 mg twice daily.   Coronary artery disease: Had a PET scan with some mild ischemia.  Patient does not complain of chest pain.  No ischemic evaluation at  this time. Chronic systolic heart failure: No obvious volume overload. AML: Followed at [facility].  Further plan for transfusions per primary team.   Risk Assessment/Risk Scores:              For questions or updates, please contact [facility] Please consult [email or web address] for contact info under      Signed, Hiawatha Dressel Gladis Norton, MD  03/07/2024 9:41 AM

## 2024-03-07 NOTE — H&P (Signed)
 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 Carolan CKD 3 AA HTN--complicated by orthostatic hypotension with dysautonomia managed locally by Dr. Odis Naegeli Previous right brain TIA in 2021  For the past 2 years lives resides in [place] but works in [place] at [employer] sees about 14 patients about every Friday and comes home-usually hydrates well with water  propel and othe liquids --- did not do that this weekend His functional baseline is he is able probably to walk about 10 to 15 feet but becomes quite tired and attributes this to his leukemia 03/04/2024 received 1 unit of blood 1 unit platelets with a baseline usually kept above 20--tells me his baseline hemoglobin is preferred to be above 10 but has been as low as 7--- he had a bone marrow biopsy as well on the 18th for histopathology  He describes the events of 9/20 Saturday morning as follows-he was feeling somewhat fatigued did not really feel well but got up and went to obtain some takeout was walking up the driveway and stumbled and fell with some weakness was able to get back up and go into the house had another episode where he was picking up and folding laundry--he fell into the corner could not get up and called EMS Came in with accidental mechanical fall-per EMS report hypotensive to the 80s heart rate in the 60s became bradycardic to the 30s apparently When I clarified with him he tells me he does not  typically feel presyncopal--overall he feels weak but does not feel dizzy with change in position [there is some history of dysautonomia as above] He did not hit his head he did not have any seizure-like symptoms he did not feel weak on any 1 side of the body no loss of continence no chest pain at the time  He has never fallen before this for him  Tells me that he was placed on an event monitor by Dr. Rolan or Odis Naegeli 12/2013--12/29/2023-echo done at that time in the office was 45-50% normal RV size mildly decreased RV function --- Holter 01/03/2024 15% PVCs --- Zio patch 6/22.5% PVCs 5.5% PAC At that office with Dr. Rolan visit he was found to be in MAT with heart rate in 100s and Toprol  was increased to XL 25 twice daily and another Zio patch monitor was placed    Review of Systems:  As mentioned above in HPI are pertinent +'s Pertinent negatives as per below   ED Course: Patient was an IV fluid bolus   Past Medical History:  Diagnosis Date   Aftercare following removal/replacement defibrillator    a. defibrillator removed 03/2019.   Arthritis    thumbs, neck, knees (06/01/2013)   Cardiomyopathy, dilated, nonischemic (HCC) 10/20/2012   Cataract    Chronic systolic heart failure (HCC)    CKD (chronic kidney disease), stage III (HCC)    Coronary artery disease    stent placed   DVT (deep vein thrombosis) in pregnancy    Dysrhythmia    trigemini  for 10 years as per pt.   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 03/06/2015   NICM (nonischemic cardiomyopathy) (HCC)    Sleep apnea    cpap   Type II diabetes mellitus (HCC)    Ventricular tachycardia (HCC)    observed during stress testing, subsequent VT on EPS   Wears glasses    Past Surgical History:  Procedure  Laterality Date   ABLATION  06-01-2013   PVC's ablated along basal inferoseptal RV by Dr Kelsie   APPENDECTOMY  1969   CARDIAC CATHETERIZATION  04/2010   CARDIAC DEFIBRILLATOR PLACEMENT  05/09/2010   MDT ICD implanted in [place] by Dr Junette   CATARACT EXTRACTION W/ INTRAOCULAR LENS  IMPLANT, BILATERAL Bilateral    ICD LEAD REMOVAL N/A 03/19/2019   Procedure: ICD LEAD AND GENERATOR REMOVAL;  Surgeon: Waddell Danelle ORN, MD;  Location: [facility] OR;  Service: Cardiovascular;  Laterality: N/A;   KNEE ARTHROSCOPY Left 1984   LUMBAR LAMINECTOMY  ~ 2004   TONSILLECTOMY AND ADENOIDECTOMY  1972   TOTAL KNEE ARTHROPLASTY Left 04/28/2017   TOTAL KNEE ARTHROPLASTY Left 04/28/2017   Procedure: TOTAL KNEE ARTHROPLASTY LEFT;  Surgeon: Yvone Rush, MD;  Location: [facility] OR;  Service: Orthopedics;  Laterality: Left;   TOTAL KNEE ARTHROPLASTY Right 05/07/2019   Procedure: TOTAL KNEE ARTHROPLASTY;  Surgeon: Yvone Rush, MD;  Location: [facility] ORS;  Service: Orthopedics;  Laterality: Right;   V-TACH ABLATION N/A 06/01/2013   Procedure: V-TACH ABLATION;  Surgeon: Lynwood JONETTA Kelsie, MD;  Location: [facility] CATH LAB;  Service: Cardiovascular;  Laterality: N/A;   VASECTOMY     VENTRICULAR ABLATION SURGERY  06/01/2013    reports that he has never smoked. He has never used smokeless tobacco. He reports current alcohol  use. He reports that he does not use drugs.  Mobility: Independent at baseline but weak  Allergies  Allergen Reactions   Lisinopril Other (See Comments), Shortness Of Breath, Swelling and Anaphylaxis    Angioedema  Angioedema    Angioedema   Family History  Problem Relation Age of Onset   Alcohol  abuse Mother    COPD Mother    Depression Mother    Heart disease Mother    Hypertension Mother    Stroke Mother    COPD Father    Heart disease Father    Hypertension Father    Stroke Father    Cancer Father    Diabetes Sister    Mental retardation Sister    Learning disabilities Sister    Hypertension  Sister    Alcohol  abuse Brother    Drug abuse Brother    Heart disease Brother    Hypertension Brother    Coronary artery disease Brother    Colon cancer Neg Hx    Pancreatic cancer Neg Hx    Esophageal cancer Neg Hx    Stomach cancer Neg Hx    Liver disease Neg Hx    Rectal cancer Neg Hx    Prior to Admission medications   Medication Sig Start Date End Date Taking? Authorizing Provider  acetaminophen  (TYLENOL ) 500 MG tablet Take 1,000 mg by mouth every 6 (six) hours as needed for mild pain, moderate pain or headache.    Yes [provider]  cefdinir (OMNICEF) 300 MG  capsule Take 300 mg by mouth 2 (two) times daily.   Yes [provider]  diphenoxylate -atropine  (LOMOTIL ) 2.5-0.025 MG tablet Take 2 tablets by mouth as needed for diarrhea or loose stools. 08/27/23  Yes [provider]  finasteride  (PROSCAR ) 5 MG tablet Take 5 mg by mouth daily. 01/16/24  Yes [provider]  furosemide  (LASIX ) 40 MG tablet Take 40 mg by mouth daily. 12/27/23  Yes [provider]  hydroxyurea  (HYDREA ) 500 MG capsule Take 1,000 mg by mouth 2 (two) times daily. 03/05/24 05/04/24 Yes [provider]  insulin  glargine (LANTUS  SOLOSTAR) 100 UNIT/ML Solostar Pen Inject 10 Units into the skin in the morning.   Yes [provider]  Investigational - Study Medication Take 300 mg by mouth 2 (two) times daily. Study name: Dulib [id number] 100mg  tablet Additional study details: Acute myeloid leukemia not having achieved remission  Take 3 tablets (300mg ) by mouth two time a day for 28 days, under fasted or fed conditions, with up to or 8oz of water .   Yes [provider]  JARDIANCE  25 MG TABS tablet TAKE 1 TABLET BY MOUTH EVERY DAY 03/02/24  Yes [name], MD  LORazepam  (ATIVAN ) 1 MG tablet Take 1 mg by mouth at bedtime. 02/02/24  Yes [provider]  magnesium  oxide (MAG-OX) 400 (240 Mg) MG tablet Take 1 tablet by mouth daily.  02/21/24  Yes [provider]  metFORMIN  (GLUCOPHAGE ) 500 MG tablet Take 1,000 mg by mouth 2 (two) times daily with a meal.   Yes [provider]  metoprolol  succinate (TOPROL  XL) 25 MG 24 hr tablet Take 1 tablet (25 mg total) by mouth in the morning and at bedtime. 12/29/23  Yes Rolan Ezra RAMAN, MD  mexiletine (MEXITIL ) 150 MG capsule Take 1 capsule (150 mg total) by mouth 2 (two) times daily. 02/12/24  Yes Rolan Ezra RAMAN, MD  ondansetron  (ZOFRAN -ODT) 8 MG disintegrating tablet Take 8 mg by mouth 3 (three) times daily. 09/18/23  Yes [provider]  polyethylene glycol (MIRALAX  / GLYCOLAX ) 17 g packet Take 17 g by mouth 3 (three) times a week. 08/13/23  Yes [provider]  rosuvastatin  (CRESTOR ) 20 MG tablet Take 20 mg by mouth at bedtime.   Yes [provider]  spironolactone  (ALDACTONE ) 25 MG tablet Take 1 tablet (25 mg total) by mouth daily. 11/03/23  Yes Rolan Ezra RAMAN, MD  tamsulosin  (FLOMAX ) 0.4 MG CAPS capsule Take 0.8 mg by mouth at bedtime.   Yes [provider]  terbinafine  (LAMISIL ) 250 MG tablet Take 250 mg by mouth daily. 02/14/24  Yes [provider]  TRESIBA FLEXTOUCH 100 UNIT/ML FlexTouch Pen Inject 5 Units into the skin daily. 02/21/24  Yes [provider]  valACYclovir  (VALTREX ) 500 MG tablet Take 500 mg by mouth daily.   Yes [provider]  voriconazole  (VFEND ) 200 MG tablet Take 200 mg by mouth 2 (two) times daily. 02/09/24  Yes [provider]  apixaban  (ELIQUIS ) 5 MG TABS tablet Take 1 tablet by mouth 2 times daily. Patient not taking: Reported on 03/07/2024 04/09/21   Rolan Ezra RAMAN, MD    Physical Exam:  Vitals:   03/07/24 0300 03/07/24 0354  BP: 101/62 106/74  Pulse: 74 78  Resp: 18 18  Temp:  98.1 F (36.7 C)  SpO2: 98% 98%    EOMI NCAT no focal deficit no icterus no pallor no wheeze no rales no rhonchi Neck soft supple S1-S2 no murmur ROM is intact  grossly to upper  extremities he has bruising over his forearms he has some bruising over his abdomen His abdomen is soft no rebound no guarding no HSM Is able to straight leg raise he has grade 1 lower extremity edema I did not access his JVD Power is 5/5  I have personally reviewed following labs and imaging studies  Labs:  Sodium 130 potassium 4.2 chloride 94 BUN/creatinine 47/2.5 (baseline 3 [birth year]/1941/1.4) AST/ALT 27/28 BNP 251 high-sensitivity troponin 43 WBC 28 hemoglobin 8.9 platelets 6 UA negative  Imaging studies:  CXR no active disease  Medical tests:  EKG independently reviewed:   Sinus with PR interval borderline prolongation, PVC noted RBBB left anterior fascicular block?  QRS axis is 45--- compare to prior EKG 12/29/2023 rate is slower  Test discussed with performing physician: No  Decision to obtain old records:  Yes  Review and summation of old records:  Yes  Principal Problem:   Generalized weakness   Assessment/Plan Syncope?  Dysautonomia versus arrhythmia Documented history of orthostatic hypotension Cardiology holding Toprol -XL Report apparently showed 580 runs of NSVT 3018 SVT runs in 10% PVC burden he is on mexiletine 150 twice daily ?  Pacemaker placement if we can get his platelets above 50 Will need close monitoring on telemetry He can continue his mexiletine 150 twice daily [historically per him this is worked best-previously he was on 200 of metoprolol  and felt quite washed out/ weak] Will get orthostatics Keep magnesium  above 2.5 potassium above 4--add magnesium  to labs is on magnesium  400 daily NICM, NYHA class I-II Previous CAD PCI to DES 2016  Usually on Lasix  40 daily Aldactone  25 daily  We will stop his Aldactone  for now He can continue his Jardiance  25 Continue Lasix  40  Cardiology AML-reactive leukocytosis--on investigational medication [id number] [300 bid?] Daytime rounder to liaise with oncology either here or at [facility]--- will need to  investigate whether this medication has any effect on arrhythmia Baseline counts are hemoglobin above 10 as well as platelets above 20 He gets transfusions more frequently twice a week now and had 1 last on 9/18 I am not sure why he has leukocytosis-probably secondary to his chemo which is experimental-but he is on prophylactic antibiotics and antivirals which we will continue including valacyclovir  Lamisil  voriconazole  as well as cefdinir He will continue his Hydrea  1000 twice daily I do not think we need any other workup currently for his leukocytosis Diabetes mellitus Continue metformin  500 twice daily, long-acting 10 units as well as sliding scale BPH Continue Proscar  5 Flomax  0.8 may need to cut back the dose of Flomax  if he is orthostatic CKD 3 A  Monitor trends with labs diuretics as above Previous unprovoked DVT Right brain TIA 2021   Severity of Illness: The appropriate patient status for this patient is INPATIENT. Inpatient status is judged to be reasonable and necessary in order to provide the required intensity of service to ensure the patient's safety. The patient's presenting symptoms, physical exam findings, and initial radiographic and laboratory data in the context of their chronic comorbidities is felt to place them at high risk for further clinical deterioration. Furthermore, it is not anticipated that the patient will be medically stable for discharge from the hospital within 2 midnights of admission.   * I certify that at the point of admission it is my clinical judgment that the patient will require inpatient hospital care spanning beyond 2 midnights from the point of admission due to high intensity of service, high risk for further  deterioration and high frequency of surveillance required.*   Family Communication: None  DVT ppx: SCD Consults called & Whom: Cardiology  Time spent: 95 minutes  Royal, MD dufm my NP partners at night for Care related  issues] [facility] --[employer] [employer] OR , [email or web address]; password [facility]  03/07/2024, 6:06 AM

## 2024-03-08 DIAGNOSIS — I2510 Atherosclerotic heart disease of native coronary artery without angina pectoris: Secondary | ICD-10-CM | POA: Diagnosis not present

## 2024-03-08 DIAGNOSIS — R001 Bradycardia, unspecified: Secondary | ICD-10-CM | POA: Diagnosis not present

## 2024-03-08 DIAGNOSIS — I493 Ventricular premature depolarization: Secondary | ICD-10-CM | POA: Diagnosis not present

## 2024-03-08 DIAGNOSIS — R531 Weakness: Secondary | ICD-10-CM

## 2024-03-08 DIAGNOSIS — I5022 Chronic systolic (congestive) heart failure: Secondary | ICD-10-CM | POA: Diagnosis not present

## 2024-03-08 LAB — BPAM RBC
Blood Product Expiration Date: 202510112359
Blood Product Expiration Date: 202510122359
ISSUE DATE / TIME: 202509211200
ISSUE DATE / TIME: 202509211531
Unit Type and Rh: 6200
Unit Type and Rh: 6200

## 2024-03-08 LAB — TYPE AND SCREEN
ABO/RH(D): A POS
Antibody Screen: NEGATIVE
Unit division: 0
Unit division: 0

## 2024-03-08 LAB — CBC WITH DIFFERENTIAL/PLATELET
Abs Immature Granulocytes: 4.1 K/uL — ABNORMAL HIGH (ref 0.00–0.07)
Band Neutrophils: 4 %
Basophils Absolute: 0 K/uL (ref 0.0–0.1)
Basophils Relative: 0 %
Blasts: 8 %
Eosinophils Absolute: 0 K/uL (ref 0.0–0.5)
Eosinophils Relative: 0 %
HCT: 32.5 % — ABNORMAL LOW (ref 39.0–52.0)
Hemoglobin: 11.1 g/dL — ABNORMAL LOW (ref 13.0–17.0)
Lymphocytes Relative: 18 %
Lymphs Abs: 4.9 K/uL — ABNORMAL HIGH (ref 0.7–4.0)
MCH: 28.7 pg (ref 26.0–34.0)
MCHC: 34.2 g/dL (ref 30.0–36.0)
MCV: 84 fL (ref 80.0–100.0)
Metamyelocytes Relative: 5 %
Monocytes Absolute: 3.8 K/uL — ABNORMAL HIGH (ref 0.1–1.0)
Monocytes Relative: 14 %
Myelocytes: 10 %
Neutro Abs: 12.2 K/uL — ABNORMAL HIGH (ref 1.7–7.7)
Neutrophils Relative %: 41 %
Platelets: 12 K/uL — CL (ref 150–400)
RBC: 3.87 MIL/uL — ABNORMAL LOW (ref 4.22–5.81)
RDW: 16.2 % — ABNORMAL HIGH (ref 11.5–15.5)
Smear Review: NORMAL
WBC: 27 K/uL — ABNORMAL HIGH (ref 4.0–10.5)
nRBC: 0 % (ref 0.0–0.2)

## 2024-03-08 LAB — HEMOGLOBIN A1C
Hgb A1c MFr Bld: 7.3 % — ABNORMAL HIGH (ref 4.8–5.6)
Mean Plasma Glucose: 163 mg/dL

## 2024-03-08 LAB — BASIC METABOLIC PANEL WITH GFR
Anion gap: 11 (ref 5–15)
BUN: 44 mg/dL — ABNORMAL HIGH (ref 8–23)
CO2: 23 mmol/L (ref 22–32)
Calcium: 8.1 mg/dL — ABNORMAL LOW (ref 8.9–10.3)
Chloride: 99 mmol/L (ref 98–111)
Creatinine, Ser: 2.21 mg/dL — ABNORMAL HIGH (ref 0.61–1.24)
GFR, Estimated: 30 mL/min — ABNORMAL LOW (ref >60)
Glucose, Bld: 177 mg/dL — ABNORMAL HIGH (ref 70–99)
Potassium: 3.7 mmol/L (ref 3.5–5.1)
Sodium: 133 mmol/L — ABNORMAL LOW (ref 135–145)

## 2024-03-08 LAB — GLUCOSE, CAPILLARY
Glucose-Capillary: 200 mg/dL — ABNORMAL HIGH (ref 70–99)
Glucose-Capillary: 192 mg/dL — ABNORMAL HIGH (ref 70–99)
Glucose-Capillary: 139 mg/dL — ABNORMAL HIGH (ref 70–99)
Glucose-Capillary: 163 mg/dL — ABNORMAL HIGH (ref 70–99)

## 2024-03-08 LAB — TSH: TSH: 1.432 u[IU]/mL (ref 0.350–4.500)

## 2024-03-08 LAB — PATHOLOGIST SMEAR REVIEW

## 2024-03-08 LAB — CORTISOL: Cortisol, Plasma: 22.4 ug/dL

## 2024-03-08 MED ORDER — INSULIN GLARGINE 100 UNIT/ML ~~LOC~~ SOLN
5.0000 [IU] | Freq: Every day | SUBCUTANEOUS | Status: DC
  Administered 2024-03-08 – 2024-03-09 (×2): 5 [IU] via SUBCUTANEOUS
  Filled 2024-03-08 (×3): qty 0.05

## 2024-03-08 MED ORDER — HYDROXYUREA 500 MG PO CAPS
500.0000 mg | ORAL_CAPSULE | Freq: Two times a day (BID) | ORAL | Status: DC
  Administered 2024-03-08 – 2024-03-10 (×4): 500 mg via ORAL
  Filled 2024-03-08 (×6): qty 1

## 2024-03-08 NOTE — Evaluation (Addendum)
 Physical Therapy Evaluation Patient Details Name: Paul BOSSIER, MD MRN: [id number] DOB: September 20, 1945 Today's Date: 03/08/2024  History of Present Illness  Pt is 78 year old [facility] on  [facility]/06/24 for near syncope causing fall. EMS performed orthostatics and pt became bradycardic PMH - DM, OSA, chronic systolic CHF with AICD placement, hypogonadism, HTN, stage 3 CKD, TKR bil, TIA, acute myeloid leukemia, orthostatic hypotension, CAD with PCI  Clinical Impression  Pt admitted with above diagnosis and presents to PT with functional limitations due to deficits listed below (See PT problem list). Pt needs skilled PT to maximize independence and safety. 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 Today was orthostatic and that has limited his amb. Should progress with mobility if medical issues improve. May benefit from OPPT for general strengthening.      Orthostatic BPs  Supine 120/76  Sitting 99/76  Standing 82/52  Standing after 3 min Unable         If plan is discharge home, recommend the following: A little help with walking and/or transfers;A little help with bathing/dressing/bathroom;Help with stairs or ramp for entrance;Assist for transportation   Can travel by private vehicle        Equipment Recommendations Other (comment) (To be determined)  Recommendations for Other Services       Functional Status Assessment Patient has had a recent decline in their functional status and demonstrates the ability to make significant improvements in function in a reasonable and predictable amount of time.     Precautions / Restrictions Precautions Precautions: Fall;Other (comment) Recall of Precautions/Restrictions: Intact Precaution/Restrictions Comments: orthostatic Restrictions Weight Bearing Restrictions Per Provider Order: No      Mobility  Bed  Mobility Overal bed mobility: Modified Independent             General bed mobility comments: Incr time/effort    Transfers Overall transfer level: Needs assistance Equipment used: None Transfers: Sit to/from Stand Sit to Stand: Contact guard assist           General transfer comment: For safety    Ambulation/Gait Ambulation/Gait assistance: Contact guard assist Gait Distance (Feet): 10 Feet Assistive device: None Gait Pattern/deviations: Step-through pattern, Decreased stride length Gait velocity: decr Gait velocity interpretation: 1.31 - 2.62 ft/sec, indicative of limited community ambulator   General Gait Details: Assist for safety. Limited distance for safety due to orthostatic hypotension  Stairs            Wheelchair Mobility     Tilt Bed    Modified Rankin (Stroke Patients Only)       Balance Overall balance assessment: Needs assistance Sitting-balance support: No upper extremity supported, Feet supported Sitting balance-Leahy Scale: Good     Standing balance support: No upper extremity supported, During functional activity Standing balance-Leahy Scale: Fair                               Pertinent Vitals/Pain Pain Assessment Pain Assessment: 0-10 Pain Score: 3  Pain Location: head Pain Descriptors / Indicators: Aching Pain Intervention(s): Limited activity within patient's tolerance, Monitored during session    Home Living Family/patient expects to be discharged to:: Private residence Living Arrangements: Spouse/significant other Available Help at Discharge: Family;Available 24 hours/day Type of Home: House Home Access: Level entry       Home Layout: Two level;Able to  live on main level with bedroom/bathroom Home Equipment: Rolling Walker (2 wheels);Cane - single point;Shower seat - built in      Prior Function Prior Level of Function : Independent/Modified Independent;Driving;Working/employed              Mobility Comments: Limited to 30' of amb due to SOB and feeling weak. Will sit down and rest and walk some more. 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       Extremity/Trunk Assessment   Upper Extremity Assessment Upper Extremity Assessment: Defer to OT evaluation    Lower Extremity Assessment Lower Extremity Assessment: Generalized weakness       Communication   Communication Communication: No apparent difficulties    Cognition Arousal: Alert Behavior During Therapy: WFL for tasks assessed/performed   PT - Cognitive impairments: No apparent impairments                         Following commands: Intact       Cueing Cueing Techniques: Verbal cues     General Comments General comments (skin integrity, edema, etc.): See flow sheet for orthostatics    Exercises Other Exercises Other Exercises: Bridging x 4   Assessment/Plan    PT Assessment Patient needs continued PT services  PT Problem List Decreased strength;Decreased activity tolerance;Decreased balance;Decreased mobility;Cardiopulmonary status limiting activity       PT Treatment Interventions DME instruction;Gait training;Functional mobility training;Therapeutic activities;Therapeutic exercise;Balance training;Patient/family education    PT Goals (Current goals can be found in the Care Plan section)  Acute Rehab PT Goals Patient Stated Goal: return home PT Goal Formulation: With patient Time For Goal Achievement: 03/22/24 Potential to Achieve Goals: Good    Frequency Min 3X/week     Co-evaluation               AM-PAC PT 6 Clicks Mobility  Outcome Measure Help needed turning from your back to your side while in a flat bed without using bedrails?: None Help needed moving from lying on your back to sitting on the side of a flat bed without using bedrails?: None Help needed moving to and from a bed to a chair (including a wheelchair)?: A Little Help needed standing up  from a chair using your arms (e.g., wheelchair or bedside chair)?: A Little Help needed to walk in hospital room?: Total Help needed climbing 3-5 steps with a railing? : Total 6 Click Score: 16    End of Session   Activity Tolerance: Treatment limited secondary to medical complications (Comment) (orthostatic hypotension) Patient left: in bed;with call bell/phone within reach;with bed alarm set;with family/visitor present;Other (comment) (MD) Nurse Communication: Mobility status;Other (comment) (orthostatic) PT Visit Diagnosis: Unsteadiness on feet (R26.81);Other abnormalities of gait and mobility (R26.89);Muscle weakness (generalized) (M62.81)    Time: 8870-8851 PT Time Calculation (min) (ACUTE ONLY): 19 min   Charges:   PT Evaluation $PT Eval Moderate Complexity: 1 Mod   PT General Charges $$ ACUTE PT VISIT: 1 Visit         [facility] PT Acute Rehabilitation Services Office [phone]   [facility]N [facility] 03/08/2024, 12:40 PM

## 2024-03-08 NOTE — Progress Notes (Addendum)
 Patient Name: Paul GORMAN Piety, MD Date of Encounter: 03/08/2024  Primary Cardiologist: [name], MD Electrophysiologist: [name], MD  Interval Summary   The patient is doing well today.  At this time, the patient denies chest pain, shortness of breath, or any new concerns.  Vital Signs    Vitals:   03/08/24 0055 03/08/24 0131 03/08/24 0403 03/08/24 0749  BP: 127/79 129/86 125/82 132/79  Pulse: 81 85 86 82  Resp: 20 17 18 20   Temp: 98.3 F (36.8 C) 98 F (36.7 C) 98.4 F (36.9 C) 98 F (36.7 C)  TempSrc: Oral Oral Oral Oral  SpO2: 96% 95% 96% 96%  Weight:      Height:        Intake/Output Summary (Last 24 hours) at 03/08/2024 0759 Last data filed at 03/08/2024 0403 Gross per 24 hour  Intake 1861 ml  Output 1750 ml  Net 111 ml   Filed Weights   03/06/24 2113 03/07/24 0354  Weight: 79.4 kg 86.9 kg    Physical Exam    GEN- NAD, Alert and oriented  Lungs- Clear to ausculation bilaterally, normal work of breathing Cardiac- Regular rate and rhythm, no murmurs, rubs or gallops GI- soft, NT, ND, + BS Extremities- no clubbing or cyanosis. 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   Assessment & Plan    Bradycardia Resolved with holding his Metoprolol  No current indication for pacing  PACs PVCs Mexitil  increased to 200 mg daily, good control currently.  ~10.4% PVCs on monitor 12/2023 - Started on mexitil  then.  CAD No s/s of ischemia.      Chronic systolic CHF Echo 12/2023 with LVEF 45-50%  Bradycardia has resolved. Keep off AV nodal agents. Continue mexitil   EP follow up made. Santez Woodcox see as needed while remains here.     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 I have personally performed an evaluation on this patient.  My findings are as follows: 78 y.o. male who presented with episodes of bradycardia.  He currently feels well.  His metoprolol  was stopped and his mexiletine was increased yesterday.  He has had no further bradycardia.  He has had less PVCs on telemetry..  Data: EKG(s) and pertinent labs, studies, etc were personally reviewed and interpreted by me:  Telemetry, EKG, labs Otherwise, I agree with data as outlined by the advanced practice provider.  Exam performed by me: Gen: No acute distress Neck: None JVD Cardiac: Regular rhythm Lungs: Normal work of breathing Extremities: No edema  My Assessment and Plan: 1.  Bradycardia: Has resolved with holding metoprolol .  No indication for pacing. 2.  PACs/PVCs: Mexiletine increased to 200 mg twice daily.  Has good control currently.  Edmon Magid continue with current management. 3.  Coronary artery disease: No signs or symptoms of ischemia 4.  Chronic systolic heart failure: Ejection fraction 45 to 50%.  No volume overload.  For now, we Mattheus Rauls continue  to hold AV nodal blockers.  Continue mexiletine.  EP follow-up has been made.  EP to sign off for now.  Call us  back if any further issues arise.  Signed,  Raford Brissett Gladis Norton, MD  03/08/2024 8:11 AM

## 2024-03-08 NOTE — Inpatient Diabetes Management (Signed)
 Inpatient Diabetes Program Recommendations  AACE/ADA: New Consensus Statement on Inpatient Glycemic Control (2015)  Target Ranges:  Prepandial:   less than 140 mg/dL      Peak postprandial:   less than 180 mg/dL (1-2 hours)      Critically ill patients:  140 - 180 mg/dL   Lab Results  Component Value Date   GLUCAP 192 (H) 03/08/2024   HGBA1C 7.3 (H) 03/06/2024    Review of Glycemic Control  Latest Reference Range & Units 03/07/24 08:21 03/07/24 12:03 03/07/24 17:26 03/07/24 21:30 03/08/24 08:59 03/08/24 12:32  Glucose-Capillary 70 - 99 mg/dL 793 (H) 780 (H) 826 (H) 179 (H) 200 (H) 192 (H)  (H): Data is abnormally high Diabetes history: Type 2 DM Outpatient Diabetes medications: Jardiance  25 mg every day, Lantus  10 units every day, Metformin  1000 mg BID,  Current orders for Inpatient glycemic control: Novolog  0-9 units TID & HS, Metformin  1000 mg BID  Inpatient Diabetes Program Recommendations:    Consider adding Lantus  8 units every day.   Thanks, Tinnie Minus, MSN, RNC-OB Diabetes Coordinator [phone] (8a-5p)

## 2024-03-08 NOTE — Progress Notes (Signed)
 Critical platelet level post RBC x2 transfusion was 5. Lab noted no call was placed d/t it being a similar result that was previously reported. However, previous critical result was ~20 hours earlier. Opyd, MD paged & order received for 1u platelets. Post transfusion CBC pending.

## 2024-03-08 NOTE — Progress Notes (Signed)
 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 He notes progressive orthostasis over the past 4-6 weeks.  In addition, he notes progressive SOB that he describes as positional as well (worse when he stands, better when laying down - seems to be better when he's hydrated).    Assessment & Plan:   Principal Problem:   Generalized weakness  Syncope Orthostatic Hypotension  Shortness of Breath He describes worsening orthostasis over the past 4-6 weeks.  Also notes SOB that seems to be positional as well (presumably related to orthostasis).  He notes generally feeling better when he's hydrated. Orthostasis today with therapy, SBP dropped from 120 to 82 with standing Metoprolol  d/c'd yesterday.  Will stop lasix  and jardiance  as well.  Spironolactone  is on hold. Follow TSH, cortisol Compression stockings Follow orthostatics - he remains significantly symptomatic, not safe to discharge yet  Bradycardia Improved with holding metoprolol   Severe Thrombocytopenia AML  Transfuse for platelets <10,000 Transfuse for Hb < 7 S/p 1 unit pRBC Unclear cause for leukocytosis, no obvious si/sx infection - CXR without active disease Phone call out to discuss plan of care with his oncologist at [facility].  Awaiting call back. 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  Holding lasix  and jardiance  due to orthostasis above.  Continue to hold spironolactone .  CAD  Noted, no CP  T2DM Hold metformin   SSI and basal insulin  Holding jardiance   BPH Proscar  Flomax     DVT prophylaxis: SCD Code Status: full Family Communication: wife at bedside  Disposition:   Status is: Inpatient Remains inpatient appropriate because: continued need for inpatient care   Consultants:  cardiology  Procedures:  none  Antimicrobials:  Anti-infectives (From admission, onward)    Start     Dose/Rate Route Frequency Ordered Stop   03/07/24 1000  valACYclovir  (VALTREX ) tablet 500 mg        500 mg Oral Daily 03/07/24 0714     03/07/24 1000  voriconazole  (VFEND ) tablet 200 mg        200 mg Oral 2 times daily 03/07/24 0714     03/07/24 1000  terbinafine  (LAMISIL ) tablet 250 mg        250 mg Oral Daily 03/07/24 0714     03/07/24 0800  cefUROXime  (CEFTIN ) tablet 250 mg        250 mg Oral 2 times daily with meals 03/07/24 0714         Subjective: No new complaints Orthostasis with standing/sitting up   Objective: Vitals:   03/08/24 0055 03/08/24 0131 03/08/24 0403 03/08/24 0749  BP: 127/79 129/86 125/82 132/79  Pulse: 81 85 86 82  Resp: 20 17 18 20   Temp: 98.3 F (36.8 C) 98 F (36.7 C) 98.4 F (36.9 C) 98 F (36.7 C)  TempSrc: Oral Oral Oral Oral  SpO2: 96% 95% 96% 96%  Weight:  Height:        Intake/Output Summary (Last 24 hours) at 03/08/2024 1554 Last data filed at 03/08/2024 0403 Gross per 24 hour  Intake 1246 ml  Output 900 ml  Net 346 ml   Filed Weights   03/06/24 2113 03/07/24 0354  Weight: 79.4 kg 86.9 kg    Examination:  General exam: Appears calm and comfortable  Respiratory system: basilar crackles  Cardiovascular system: S1 & S2 heard, RRR.  Gastrointestinal system: Abdomen is nondistended, soft and nontender.  Central nervous system: Alert and oriented. No focal neurological  deficits. Extremities: chronic LE edema    Data Reviewed: I have personally reviewed following labs and imaging studies  CBC: Recent Labs  Lab 03/06/24 2352 03/07/24 2140 03/08/24 0651  WBC 28.8* 24.2* 27.0*  NEUTROABS  --   --  12.2*  HGB 8.9* 11.4* 11.1*  HCT 26.3* 32.5* 32.5*  MCV 84.6 83.3 84.0  PLT 6* 5* 12*    Basic Metabolic Panel: Recent Labs  Lab 03/06/24 2250 03/08/24 0651  NA 130* 133*  K 4.2 3.7  CL 94* 99  CO2 23 23  GLUCOSE 200* 177*  BUN 47* 44*  CREATININE 2.50* 2.21*  CALCIUM  7.8* 8.1*    GFR: Estimated Creatinine Clearance: 29.4 mL/min (Nicklas Mcsweeney) (by C-G formula based on SCr of 2.21 mg/dL (H)).  Liver Function Tests: Recent Labs  Lab 03/06/24 2250  AST 27  ALT 28  ALKPHOS 135*  BILITOT 0.8  PROT 4.9*  ALBUMIN 2.1*    CBG: Recent Labs  Lab 03/07/24 1203 03/07/24 1726 03/07/24 2130 03/08/24 0859 03/08/24 1232  GLUCAP 219* 173* 179* 200* 192*     No results found for this or any previous visit (from the past 240 hours).       Radiology Studies: DG Chest Portable 1 View Result Date: 03/06/2024 CLINICAL DATA:  Shortness of breath EXAM: PORTABLE CHEST 1 VIEW COMPARISON:  05/05/2019 FINDINGS: Right Port-Ericka Marcellus-Cath in place with the tip in the right atrium. Heart and mediastinal contours are within normal limits. No focal opacities or effusions. No acute bony abnormality. IMPRESSION: No active disease. Electronically Signed   By: Franky Crease M.D.   On: 03/06/2024 23:17        Scheduled Meds:  cefUROXime   250 mg Oral BID WC   finasteride   5 mg Oral Daily   hydroxyurea   500 mg Oral BID WC   insulin  aspart  0-5 Units Subcutaneous QHS   insulin  aspart  0-9 Units Subcutaneous TID WC   insulin  glargine  5 Units Subcutaneous QHS   LORazepam   1 mg Oral QHS   magnesium  oxide  400 mg Oral Daily   mexiletine  200 mg Oral BID   ondansetron   8 mg Oral TID   polyethylene glycol  17 g Oral Once per day on Monday Wednesday Friday   sodium  chloride flush  10-40 mL Intracatheter Q12H   tamsulosin   0.8 mg Oral QHS   terbinafine   250 mg Oral Daily   valACYclovir   500 mg Oral Daily   voriconazole   200 mg Oral BID   Continuous Infusions:   LOS: 1 day    Time spent: over 30 min     Meliton Monte, MD [facility]   To contact the attending provider between 7A-7P or the covering provider during after hours 7P-7A, please log into the web site [email or web address] and access using universal [place] password for that web site. If you do not have the password, please call  the hospital operator.  03/08/2024, 3:54 PM

## 2024-03-08 NOTE — Progress Notes (Signed)
 OT Cancellation Note  Patient Details Name: Paul LAMINACK, MD MRN: [id number] DOB: 1945-10-19   Cancelled Treatment:    Reason Eval/Treat Not Completed: Fatigue/lethargy limiting ability to participate. Pt reports that he did not sleep well last night and is tired and requests OT return tomorrow  Jacques Karna Loose 03/08/2024, 2:12 PM

## 2024-03-08 NOTE — Progress Notes (Signed)
 Pt refused CHG wipes, order discontinued

## 2024-03-09 DIAGNOSIS — R531 Weakness: Secondary | ICD-10-CM | POA: Diagnosis not present

## 2024-03-09 LAB — CBC WITH DIFFERENTIAL/PLATELET
Basophils Absolute: 0 K/uL (ref 0.0–0.1)
Basophils Relative: 0 %
Eosinophils Absolute: 0.3 K/uL (ref 0.0–0.5)
Eosinophils Relative: 1 %
HCT: 32.4 % — ABNORMAL LOW (ref 39.0–52.0)
Hemoglobin: 11.2 g/dL — ABNORMAL LOW (ref 13.0–17.0)
Lymphocytes Relative: 28 %
Lymphs Abs: 7.6 K/uL — ABNORMAL HIGH (ref 0.7–4.0)
MCH: 29.2 pg (ref 26.0–34.0)
MCHC: 34.6 g/dL (ref 30.0–36.0)
MCV: 84.4 fL (ref 80.0–100.0)
Monocytes Absolute: 6.5 K/uL — ABNORMAL HIGH (ref 0.1–1.0)
Monocytes Relative: 24 %
Neutro Abs: 12.8 K/uL — ABNORMAL HIGH (ref 1.7–7.7)
Neutrophils Relative %: 47 %
Platelets: 6 K/uL — CL (ref 150–400)
RBC: 3.84 MIL/uL — ABNORMAL LOW (ref 4.22–5.81)
RDW: 16.6 % — ABNORMAL HIGH (ref 11.5–15.5)
WBC: 27.2 K/uL — ABNORMAL HIGH (ref 4.0–10.5)
nRBC: 0 % (ref 0.0–0.2)

## 2024-03-09 LAB — COMPREHENSIVE METABOLIC PANEL WITH GFR
ALT: 28 U/L (ref 0–44)
AST: 24 U/L (ref 15–41)
Albumin: 1.9 g/dL — ABNORMAL LOW (ref 3.5–5.0)
Alkaline Phosphatase: 232 U/L — ABNORMAL HIGH (ref 38–126)
Anion gap: 14 (ref 5–15)
BUN: 45 mg/dL — ABNORMAL HIGH (ref 8–23)
CO2: 22 mmol/L (ref 22–32)
Calcium: 8 mg/dL — ABNORMAL LOW (ref 8.9–10.3)
Chloride: 95 mmol/L — ABNORMAL LOW (ref 98–111)
Creatinine, Ser: 2.18 mg/dL — ABNORMAL HIGH (ref 0.61–1.24)
GFR, Estimated: 31 mL/min — ABNORMAL LOW (ref >60)
Glucose, Bld: 156 mg/dL — ABNORMAL HIGH (ref 70–99)
Potassium: 3.6 mmol/L (ref 3.5–5.1)
Sodium: 131 mmol/L — ABNORMAL LOW (ref 135–145)
Total Bilirubin: 1.3 mg/dL — ABNORMAL HIGH (ref 0.0–1.2)
Total Protein: 4.9 g/dL — ABNORMAL LOW (ref 6.5–8.1)

## 2024-03-09 LAB — BPAM PLATELET PHERESIS
Blood Product Expiration Date: 202509232359
ISSUE DATE / TIME: 202509220103
Unit Type and Rh: 202509232359
Unit Type and Rh: 5100

## 2024-03-09 LAB — PHOSPHORUS: Phosphorus: 4.8 mg/dL — ABNORMAL HIGH (ref 2.5–4.6)

## 2024-03-09 LAB — MAGNESIUM: Magnesium: 1.6 mg/dL — ABNORMAL LOW (ref 1.7–2.4)

## 2024-03-09 LAB — PREPARE PLATELET PHERESIS: Unit division: 0

## 2024-03-09 LAB — GLUCOSE, CAPILLARY
Glucose-Capillary: 147 mg/dL — ABNORMAL HIGH (ref 70–99)
Glucose-Capillary: 227 mg/dL — ABNORMAL HIGH (ref 70–99)
Glucose-Capillary: 158 mg/dL — ABNORMAL HIGH (ref 70–99)
Glucose-Capillary: 176 mg/dL — ABNORMAL HIGH (ref 70–99)

## 2024-03-09 MED ORDER — CHLORHEXIDINE GLUCONATE CLOTH 2 % EX PADS
6.0000 | MEDICATED_PAD | Freq: Every day | CUTANEOUS | Status: DC
Start: 2024-03-09 — End: 2024-03-10
  Administered 2024-03-09 – 2024-03-10 (×2): 6 via TOPICAL

## 2024-03-09 MED ORDER — SODIUM CHLORIDE 0.9% FLUSH: 10.0000 mL | INTRAVENOUS | Status: DC | PRN

## 2024-03-09 MED ORDER — SODIUM CHLORIDE 0.9% IV SOLUTION: Freq: Once | INTRAVENOUS | Status: DC

## 2024-03-09 MED ORDER — SODIUM CHLORIDE 0.9% FLUSH
10.0000 mL | Freq: Two times a day (BID) | INTRAVENOUS | Status: DC
  Administered 2024-03-09 – 2024-03-10 (×2): 10 mL

## 2024-03-09 NOTE — TOC Initial Note (Signed)
 Transition of Care [facility]) - Initial/Assessment Note    Patient Details  Name: Paul MENDE, MD MRN: [id number] Date of Birth: 11-26-1945  Transition of Care [facility]) CM/SW Contact:    Sudie Erminio Deems, RN Phone Number: 03/09/2024, 12:18 PM  Clinical Narrative: Patient presented for generalized weakness and bradycardia. PTA patient was from home with spouse. Spouse was at the bedside during the visit. Inpatient Case Manager discussed PT recommendations and the patient is agreeable to outpatient PT-spouse wants the [facility] Location. Ambulatory referral submitted via Epic. Information added to the AVS and the office will call the patient with visit time. Inpatient Case Manager discussed DME recommendations and the patient declined the rollator. He wants to only use a cane. No further needs identified at this time.                 Expected Discharge Plan: OP Rehab Barriers to Discharge: No Barriers Identified   Patient Goals and CMS Choice Patient states their goals for this hospitalization and ongoing recovery are:: plan to return home  Expected Discharge Plan and Services In-house Referral: NA Discharge Planning Services: CM Consult   Living arrangements for the past 2 months: Single Family Home   Prior Living Arrangements/Services Living arrangements for the past 2 months: Single Family Home Lives with:: Spouse Patient language and need for interpreter reviewed:: Yes Do you feel safe going back to the place where you live?: Yes      Need for Family Participation in Patient Care: Yes (Comment)     Criminal Activity/Legal Involvement Pertinent to Current Situation/Hospitalization: No - Comment as needed  Activities of Daily Living   ADL Screening (condition at time of admission) Independently performs ADLs?: Yes (appropriate for developmental age) Is the patient deaf or have difficulty hearing?: No Does the patient have difficulty seeing, even when wearing  glasses/contacts?: No Does the patient have difficulty concentrating, remembering, or making decisions?: No  Permission Sought/Granted Permission sought to share information with : Family Supports, Case Manager   Emotional Assessment   Attitude/Demeanor/Rapport: Engaged Affect (typically observed): Appropriate Orientation: : Oriented to Self, Oriented to Place, Oriented to  Time Alcohol  / Substance Use: Not Applicable Psych Involvement: No (comment)  Admission diagnosis:  Elevated troponin [R79.89] Generalized weakness [R53.1] AKI (acute kidney injury) [N17.9] Patient Active Problem List   Diagnosis Date Noted   Generalized weakness 03/07/2024   TIA (transient ischemic attack) 07/02/2019   Primary osteoarthritis of right knee 05/07/2019   Aftercare following removal or replacement of defibrillator 03/20/2019   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 [place] [address] Phone: [phone] Fax: [phone]  [id number] [place], [place] - [address] AT [facility] [address] [facility] [address] [place] [address] Phone: [phone] Fax: [phone]  Social Drivers of Health (SDOH) Social History: SDOH Screenings   Food Insecurity: No Food Insecurity (03/07/2024)  Housing: Low Risk  (03/07/2024)  Recent Concern: Housing - High Risk (01/23/2024)   Received from  [facility]  Transportation  Needs: No Transportation Needs (03/07/2024)  Utilities: Not At Risk (03/07/2024)  Depression (PHQ2-9): Low Risk  (07/20/2019)  Financial Resource Strain: Low Risk  (01/23/2024)   Received from [facility]  Physical Activity: Inactive (01/23/2024)   Received from [facility]  Social Connections: Unknown (03/08/2024)  Stress: No Stress Concern Present (01/23/2024)   Received from [facility]  Tobacco Use: Low Risk  (03/06/2024)  Health Literacy: Low Risk  (03/19/2023)   Received from [facility]   Readmission Risk Interventions     No data to display

## 2024-03-09 NOTE — Progress Notes (Signed)
 Mepilex dressings changed on R arm d/t bloody drainage on linens. Attempted linen change as well as measurement for thigh high compression stockings. Pt stated he didn't want his linens changed since he was most likely discharging today & would like stocking measurement done later this AM.

## 2024-03-09 NOTE — Progress Notes (Signed)
 PT Cancellation Note  Patient Details Name: Paul DRONE, MD MRN: [id number] DOB: 09-16-1945   Cancelled Treatment:    Reason Eval/Treat Not Completed: (P) Medical issues which prohibited therapy (Pt platelet labs just resulted and showing 6 (severely low). Per RN, pt does not yet have compression socks and was symptomatic of drop in BP working with OT in AM. Will defer PT session this morning, RN notified pt needs compression socks ordered.) Will continue efforts per PT plan of care once pt medically appropriate for therapies.   Lional Icenogle M Leveda Kendrix 03/09/2024, 11:24 AM

## 2024-03-09 NOTE — Evaluation (Signed)
 Occupational Therapy Evaluation Patient Details Name: HOLLIE BARTUS, MD MRN: [id number] DOB: 01/25/1946 Today's Date: 03/09/2024   History of Present Illness   Pt is 78 year old presented to [facility] on  [facility]/06/24 for near syncope causing fall. EMS performed orthostatics and pt became bradycardic PMH - DM, OSA, chronic systolic CHF with AICD placement, hypogonadism, HTN, stage 3 CKD, TKR bil, TIA, acute myeloid leukemia, orthostatic hypotension, CAD with PCI     Clinical Impressions Pt admitted with the above diagnosis and has the deficits listed below. Pt would benefit from cont OT to increase independence with LE adls and address energy conservation techniques. Pt currently requires assist to walk short distances due to orthostatic hypotension and poor balance. Pt used to have a walker and cane but gave them away. Pt would now benefit from a rollator due to orthostatics and fall risks. Pt also with HR up into 140s with SOB during activity. Significant edema noted in BLES which pt states is normal. Pt stated he currently walks short distances only and works 5x week but told PT he only works 1x week.  Unsure if pt having some cognitive deficits but otherwise appeared ok.  Do not feel pt is safe to go home with current platelets combined with orthostatics(below) and increased HR. Will rec HHOT when medically stable for d/c.  BPs Lying down  138/82 Sitting EOB   122/71 Standing 103/60  Walked to bathroom with HR up into 140s and SOB. When returned to chair BP 121/71 in sitting.     If plan is discharge home, recommend the following:   A little help with walking and/or transfers;A little help with bathing/dressing/bathroom;Assistance with cooking/housework;Help with stairs or ramp for entrance     Functional Status Assessment   Patient has had a recent decline in their functional status and demonstrates the ability to make significant improvements in function in a reasonable and predictable  amount of time.     Equipment Recommendations   Other (comment) (rollator due to orthostatics)     Recommendations for Other Services         Precautions/Restrictions   Precautions Precautions: Fall;Other (comment) (multiple bruises (leukemia)) Recall of Precautions/Restrictions: Intact Precaution/Restrictions Comments: orthostatic, tachycardic up to 135 Restrictions Weight Bearing Restrictions Per Provider Order: No     Mobility Bed Mobility Overal bed mobility: Modified Independent             General bed mobility comments: Incr time/effort    Transfers Overall transfer level: Needs assistance Equipment used: None Transfers: Sit to/from Stand Sit to Stand: Contact guard assist           General transfer comment: For safety      Balance Overall balance assessment: Needs assistance Sitting-balance support: No upper extremity supported, Feet supported Sitting balance-Leahy Scale: Good     Standing balance support: No upper extremity supported, During functional activity Standing balance-Leahy Scale: Fair Standing balance comment: pt required assist with dynamic balance.                           ADL either performed or assessed with clinical judgement   ADL Overall ADL's : Needs assistance/impaired Eating/Feeding: Independent;Sitting   Grooming: Wash/dry hands;Wash/dry face;Oral care;Applying deodorant;Set up;Sitting Grooming Details (indicate cue type and reason): cg assist if standing due to [facility] Upper Body Bathing: Set up;Sitting   Lower Body Bathing: Minimal assistance;Sit to/from stand   Upper Body Dressing : Set up;Sitting   Lower  Body Dressing: Moderate assistance;Sit to/from stand;Cueing for compensatory techniques Lower Body Dressing Details (indicate cue type and reason): pt wears slip on shoes at home and required assist donning B socks. BLEs very swollen and pt states this is normal for him. Toilet Transfer: Contact  guard assist;Ambulation;Comfort height toilet;Grab bars Toilet Transfer Details (indicate cue type and reason): pt walked to bathroom despite drop in BP. Pt was not symptomatic Toileting- Clothing Manipulation and Hygiene: Contact guard assist;Sit to/from stand   Tub/ Shower Transfer: Minimal assistance;Ambulation;Shower seat;Walk-in Building surveyor Details (indicate cue type and reason): pt walked into shower with small step over Functional mobility during ADLs: Minimal assistance General ADL Comments: pt close to baseline but is needing assist with LE dressing. Pt cannot donn socks and feet with significant edema. Pt unsteady on feet while walking to bathroom.  pt states he gave walker and cane away so may eed to reorder what he needs.     Vision Baseline Vision/History: 0 No visual deficits Ability to See in Adequate Light: 0 Adequate Patient Visual Report: No change from baseline Vision Assessment?: No apparent visual deficits     Perception Perception: Within Functional Limits       Praxis         Pertinent Vitals/Pain Pain Assessment Pain Assessment: 0-10 Pain Score: 3  Pain Location: head Pain Descriptors / Indicators: Aching Pain Intervention(s): Monitored during session, Repositioned     Extremity/Trunk Assessment Upper Extremity Assessment Upper Extremity Assessment: Overall WFL for tasks assessed   Lower Extremity Assessment Lower Extremity Assessment: Defer to PT evaluation   Cervical / Trunk Assessment Cervical / Trunk Assessment: Normal   Communication Communication Communication: No apparent difficulties   Cognition Arousal: Alert Behavior During Therapy: WFL for tasks assessed/performed Cognition: No apparent impairments             OT - Cognition Comments: No apparent deficits. pt did tell one therapist he works one day a week and another that he works currenlty 5x week.                 Following commands: Intact        Cueing  General Comments   Cueing Techniques: Verbal cues  Pt most limited today by increased HR with activity and OH.   Exercises     Shoulder Instructions      Home Living Family/patient expects to be discharged to:: Private residence Living Arrangements: Spouse/significant other Available Help at Discharge: Family;Available 24 hours/day Type of Home: House Home Access: Level entry     Home Layout: Two level;Able to live on main level with bedroom/bathroom     Bathroom Shower/Tub: Producer, television/film/video: Standard     Home Equipment: Shower seat;Other (comment) (pt states he gave away his walker and cane.)   Additional Comments: Pt states he gave cane and walker away.  Need to confirm. Feel he may benefit from rollator given OH issues.      Prior Functioning/Environment Prior Level of Function : Independent/Modified Independent;Driving;Working/employed             Mobility Comments: Limited to 27' of amb due to SOB and feeling weak. Will sit down and rest and walk some more. 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 working 5x week)      OT Problem List: Decreased activity tolerance;Impaired balance (sitting and/or standing);Increased edema;Pain;Cardiopulmonary status limiting activity   OT Treatment/Interventions: Self-care/ADL training;Energy conservation;Therapeutic activities;Balance training  OT Goals(Current goals can be found in the care plan section)   Acute Rehab OT Goals Patient Stated Goal: to go home OT Goal Formulation: With patient Time For Goal Achievement: 03/23/24 Potential to Achieve Goals: Good ADL Goals Pt Will Perform Grooming: with modified independence;standing Pt Will Perform Lower Body Bathing: with modified independence;sit to/from stand Pt Will Perform Lower Body Dressing: with modified independence;sit to/from stand Additional ADL Goal #1: Pt will walk to bathroom  and complete all toileting with mod I without equipment but extra time for fatigue. Additional ADL Goal #2: pt will state 3 things he can do at home to conserve energy during adls without cues   OT Frequency:  Min 2X/week    Co-evaluation              AM-PAC OT 6 Clicks Daily Activity     Outcome Measure Help from another person eating meals?: None Help from another person taking care of personal grooming?: A Little Help from another person toileting, which includes using toliet, bedpan, or urinal?: A Little Help from another person bathing (including washing, rinsing, drying)?: A Little Help from another person to put on and taking off regular upper body clothing?: A Little Help from another person to put on and taking off regular lower body clothing?: A Little 6 Click Score: 19   End of Session Nurse Communication: Mobility status  Activity Tolerance: Patient limited by fatigue Patient left: in chair;with call bell/phone within reach  OT Visit Diagnosis: Unsteadiness on feet (R26.81)                Time: 9248-9184 OT Time Calculation (min): 24 min Charges:  OT General Charges $OT Visit: 1 Visit OT Evaluation $OT Eval Moderate Complexity: 1 Mod OT Treatments $Self Care/Home Management : 8-22 mins  Joshua Silvano Dragon 03/09/2024, 8:36 AM

## 2024-03-09 NOTE — Progress Notes (Signed)
 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 He notes progressive orthostasis over the past 4-6 weeks.  In addition, he notes progressive SOB that he describes as positional as well (worse when he stands, better when laying down - seems to be better when he's hydrated).    EP was consulted due to bradycardia.  Metoprolol  was discontinued.  Her jardiance , spironolactone , and lasix  have also been discontinued in the setting of his orthostasis.    Has orthostasis which seems to be improving.  Has continued thrombocytopenia, we're transfusing him today.  Goal is platelets above 20,000 prior to discharge.  Of note, he's in [place] clinical trial on an investigational drug at [facility].  If he remains admitted beyond 9/24, would plan to transfer him to [facility] (hopefully he can discharge tmrw if platelets over 20,000 and his orthostasis is better).  Assessment & Plan:   Principal Problem:   Generalized weakness  Syncope Orthostatic Hypotension  Shortness of Breath He describes worsening orthostasis over the past 4-6 weeks.  Also notes SOB that seems to be positional as well (presumably related to orthostasis).  He notes generally feeling better when he's hydrated. Orthostasis 9/22 with therapy, SBP dropped from 120 to 82 with standing Continued orthostasis today, but better than 9/22 (SBP dropped to 103 with standing, symptoms were improved) Metoprolol  d/c'd.  Will stop lasix  and jardiance  as well.  Spironolactone  is on hold. Follow TSH wnl, cortisol wnl Compression stockings - discussed addition of abdominal binder - he wasn't interested Follow orthostatics - he remains significantly symptomatic, not safe to discharge  yet  Bradycardia Improved with holding metoprolol   Severe Thrombocytopenia AML  Transfuse for platelets <10,000 (would like platelets over 20,000 prior to discharge) Transfuse for Hb < 7 S/p 1 unit pRBC Unclear cause for leukocytosis, no obvious si/sx infection - CXR without active disease Discussed with his oncologist today, Dr. Carolan, hopeful to discharge soon (after we transfuse platelets and with some more improvement in his orthostasis), but if he requires admission longer than tmrw, need to pursue transfer to [facility] Per my discussion with fellow 9/21, low suspicion that investigational med with notable cardiac side effects Continue cefdinir, hydroxyurea  (adjusted based on renal function), terbinafine , valcyclovir, voriconazole  As he's on an investigational medication in [facility] [place] trial, if he requires hospitalization beyond tomorrow, would transfer to [facility] (see phone number for his primary oncologist in sign out for questions)  AKI on CKD IIIa Baseline creatinine appears to be around 1.5 Presented with creatinine 2.5 Creatinine 2.18 today  Improving, will trend  PACs  PVCs Mexiletine per EP  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  Holding lasix  and jardiance  due to orthostasis above.  Continue to hold spironolactone .  CAD  Noted, no CP  T2DM Hold metformin   SSI and basal insulin  Holding jardiance   BPH Proscar  Flomax     DVT prophylaxis: SCD Code Status: full Family Communication: wife at bedside  Disposition:   Status is: Inpatient Remains inpatient appropriate because: continued need for inpatient care   Consultants:  cardiology  Procedures:  none  Antimicrobials:  Anti-infectives (From admission, onward)    Start     Dose/Rate  Route Frequency Ordered Stop   03/07/24 1000  valACYclovir  (VALTREX ) tablet 500 mg        500 mg Oral Daily 03/07/24 0714     03/07/24 1000  voriconazole   (VFEND ) tablet 200 mg        200 mg Oral 2 times daily 03/07/24 0714     03/07/24 1000  terbinafine  (LAMISIL ) tablet 250 mg        250 mg Oral Daily 03/07/24 0714     03/07/24 0800  cefUROXime  (CEFTIN ) tablet 250 mg        250 mg Oral 2 times daily with meals 03/07/24 0714         Subjective: No new complaints Feels better today Just feels weak   Objective: Vitals:   03/09/24 1618 03/09/24 1730 03/09/24 1808 03/09/24 1846  BP: 135/76 135/79 135/76 133/74  Pulse:  85 88 89  Resp: 17 18 17 14   Temp: 98.1 F (36.7 C) 98.1 F (36.7 C) 98.1 F (36.7 C) 97.9 F (36.6 C)  TempSrc: Oral  Oral   SpO2:  97% 97% 96%  Weight:      Height:        Intake/Output Summary (Last 24 hours) at 03/09/2024 1900 Last data filed at 03/09/2024 1700 Gross per 24 hour  Intake 296 ml  Output 1000 ml  Net -704 ml   Filed Weights   03/06/24 2113 03/07/24 0354  Weight: 79.4 kg 86.9 kg    Examination:  General: No acute distress. Cardiovascular: RRR Lungs: bibasilar crackles Abdomen: Soft, nontender, nondistended Neurological: Alert and oriented 3. Moves all extremities 4 with equal strength. Cranial nerves II through XII grossly intact. Extremities: chronic LE edema   Data Reviewed: I have personally reviewed following labs and imaging studies  CBC: Recent Labs  Lab 03/06/24 2352 03/07/24 2140 03/08/24 0651 03/09/24 0559  WBC 28.8* 24.2* 27.0* 27.2*  NEUTROABS  --   --  12.2* 12.8*  HGB 8.9* 11.4* 11.1* 11.2*  HCT 26.3* 32.5* 32.5* 32.4*  MCV 84.6 83.3 84.0 84.4  PLT 6* 5* 12* 6*    Basic Metabolic Panel: Recent Labs  Lab 03/06/24 2250 03/08/24 0651 03/09/24 0559  NA 130* 133* 131*  K 4.2 3.7 3.6  CL 94* 99 95*  CO2 23 23 22   GLUCOSE 200* 177* 156*  BUN 47* 44* 45*  CREATININE 2.50* 2.21* 2.18*  CALCIUM  7.8* 8.1* 8.0*  MG  --   --  1.6*  PHOS  --   --  4.8*    GFR: Estimated Creatinine Clearance: 29.8 mL/min (Darren Nodal) (by C-G formula based on SCr of 2.18 mg/dL  (H)).  Liver Function Tests: Recent Labs  Lab 03/06/24 2250 03/09/24 0559  AST 27 24  ALT 28 28  ALKPHOS 135* 232*  BILITOT 0.8 1.3*  PROT 4.9* 4.9*  ALBUMIN 2.1* 1.9*    CBG: Recent Labs  Lab 03/08/24 1659 03/08/24 2113 03/09/24 0746 03/09/24 1128 03/09/24 1617  GLUCAP 139* 163* 147* 227* 158*     No results found for this or any previous visit (from the past 240 hours).       Radiology Studies: No results found.       Scheduled Meds:  sodium chloride    Intravenous Once   cefUROXime   250 mg Oral BID WC   Chlorhexidine  Gluconate Cloth  6 each Topical Daily   finasteride   5 mg Oral Daily   hydroxyurea   500 mg Oral BID WC   insulin  aspart  0-5  Units Subcutaneous QHS   insulin  aspart  0-9 Units Subcutaneous TID WC   insulin  glargine  5 Units Subcutaneous QHS   LORazepam   1 mg Oral QHS   magnesium  oxide  400 mg Oral Daily   mexiletine  200 mg Oral BID   ondansetron   8 mg Oral TID   polyethylene glycol  17 g Oral Once per day on Monday Wednesday Friday   sodium chloride  flush  10-40 mL Intracatheter Q12H   sodium chloride  flush  10-40 mL Intracatheter Q12H   tamsulosin   0.8 mg Oral QHS   terbinafine   250 mg Oral Daily   valACYclovir   500 mg Oral Daily   voriconazole   200 mg Oral BID   Continuous Infusions:   LOS: 2 days    Time spent: over 30 min     Meliton Monte, MD [facility]   To contact the attending provider between 7A-7P or the covering provider during after hours 7P-7A, please log into the web site [email or web address] and access using universal [place] password for that web site. If you do not have the password, please call the hospital operator.  03/09/2024, 7:00 PM

## 2024-03-09 NOTE — Plan of Care (Signed)
  Problem: Education: Goal: Ability to describe self-care measures that may prevent or decrease complications (Diabetes Survival Skills Education) will improve Outcome: Progressing Goal: Individualized Educational Video(s) Outcome: Progressing   Problem: Coping: Goal: Ability to adjust to condition or change in health will improve Outcome: Progressing   Problem: Fluid Volume: Goal: Ability to maintain a balanced intake and output will improve Outcome: Progressing   Problem: Health Behavior/Discharge Planning: Goal: Ability to identify and utilize available resources and services will improve Outcome: Progressing Goal: Ability to manage health-related needs will improve Outcome: Progressing   Problem: Metabolic: Goal: Ability to maintain appropriate glucose levels will improve Outcome: Progressing   Problem: Nutritional: Goal: Maintenance of adequate nutrition will improve Outcome: Progressing Goal: Progress toward achieving an optimal weight will improve Outcome: Progressing   Problem: Clinical Measurements: Goal: Ability to maintain clinical measurements within normal limits will improve Outcome: Progressing Goal: Will remain free from infection Outcome: Progressing Goal: Diagnostic test results will improve Outcome: Progressing   Problem: Activity: Goal: Risk for activity intolerance will decrease Outcome: Progressing   Problem: Nutrition: Goal: Adequate nutrition will be maintained Outcome: Progressing   Problem: Coping: Goal: Level of anxiety will decrease Outcome: Progressing   Problem: Elimination: Goal: Will not experience complications related to bowel motility Outcome: Progressing Goal: Will not experience complications related to urinary retention Outcome: Progressing   Problem: Pain Managment: Goal: General experience of comfort will improve and/or be controlled Outcome: Progressing   Problem: Safety: Goal: Ability to remain free from injury  will improve Outcome: Progressing   Problem: Skin Integrity: Goal: Risk for impaired skin integrity will decrease Outcome: Progressing

## 2024-03-10 ENCOUNTER — Other Ambulatory Visit (HOSPITAL_COMMUNITY): Payer: Self-pay

## 2024-03-10 DIAGNOSIS — N179 Acute kidney failure, unspecified: Secondary | ICD-10-CM | POA: Diagnosis not present

## 2024-03-10 DIAGNOSIS — R531 Weakness: Secondary | ICD-10-CM | POA: Diagnosis not present

## 2024-03-10 DIAGNOSIS — R7989 Other specified abnormal findings of blood chemistry: Secondary | ICD-10-CM

## 2024-03-10 LAB — CBC WITH DIFFERENTIAL/PLATELET
Abs Immature Granulocytes: 8.78 K/uL — ABNORMAL HIGH (ref 0.00–0.07)
Basophils Absolute: 0.1 K/uL (ref 0.0–0.1)
Basophils Relative: 0 %
Basophils Relative: 0.3 % — AB (ref 0.0–0.1)
Eosinophils Absolute: 0 K/uL (ref 0.0–0.5)
Eosinophils Absolute: 1 % (ref 0.0–0.5)
Eosinophils Relative: 0 %
Eosinophils Relative: 0 % (ref 0.0–0.5)
HCT: 32.1 % — ABNORMAL LOW (ref 39.0–52.0)
HCT: 32.8 % — ABNORMAL LOW (ref 39.0–52.0)
Hemoglobin: 11.1 g/dL — ABNORMAL LOW (ref 13.0–17.0)
Hemoglobin: 11.2 g/dL — ABNORMAL LOW (ref 13.0–17.0)
Immature Granulocytes: 26 %
Lymphocytes Relative: 4 %
Lymphocytes Relative: 9 %
Lymphs Abs: 1.3 % (ref 0.7–4.0)
Lymphs Abs: 3.1 K/uL (ref 0.7–4.0)
MCH: 28.8 pg (ref 26.0–34.0)
MCH: 29.1 pg (ref 26.0–34.0)
MCHC: 34.1 g/dL (ref 30.0–36.0)
MCHC: 34.6 g/dL (ref 30.0–36.0)
MCV: 84 fL (ref 80.0–100.0)
MCV: 84.3 fL (ref 80.0–100.0)
Monocytes Absolute: 0 % (ref 0.1–1.0)
Monocytes Absolute: 12.3 K/uL — ABNORMAL HIGH (ref 0.1–1.0)
Monocytes Relative: 14 % (ref 0.7–4.0)
Monocytes Relative: 38 %
Monocytes Relative: 4.6 % — AB (ref 0.1–1.0)
Neutro Abs: 26.7 K/uL — AB (ref 1.7–7.7)
Neutro Abs: 9.1 K/uL — ABNORMAL HIGH (ref 1.7–7.7)
Neutrophils Relative %: 27 %
Neutrophils Relative %: 81 %
Platelets: 16 K/uL — CL (ref 150–400)
Platelets: 25 K/uL — CL (ref 150–400)
RBC: 3.82 MIL/uL — ABNORMAL LOW (ref 4.22–5.81)
RBC: 3.89 MIL/uL — ABNORMAL LOW (ref 4.22–5.81)
RDW: 16.5 % — ABNORMAL HIGH (ref 11.5–15.5)
RDW: 16.7 % — ABNORMAL HIGH (ref 11.5–15.5)
Smear Review: NORMAL
WBC: 33 K/uL — ABNORMAL HIGH (ref 4.0–10.5)
WBC: 33.4 K/uL — ABNORMAL HIGH (ref 4.0–10.5)
nRBC: 0 % (ref 0.0–0.2)
nRBC: 0 % (ref 0.0–0.2)

## 2024-03-10 LAB — CBC
HCT: 32.7 % — ABNORMAL LOW (ref 39.0–52.0)
Hemoglobin: 11.1 g/dL — ABNORMAL LOW (ref 13.0–17.0)
MCH: 29.1 pg (ref 26.0–34.0)
MCHC: 33.9 g/dL (ref 30.0–36.0)
MCV: 85.8 fL (ref 80.0–100.0)
Platelets: 22 K/uL — CL (ref 150–400)
RBC: 3.81 MIL/uL — ABNORMAL LOW (ref 4.22–5.81)
RDW: 16.9 % — ABNORMAL HIGH (ref 11.5–15.5)
WBC: 39.1 K/uL — ABNORMAL HIGH (ref 4.0–10.5)
nRBC: 0 % (ref 0.0–0.2)

## 2024-03-10 LAB — PREPARE PLATELET PHERESIS
Unit division: 0
Unit division: 0

## 2024-03-10 LAB — BPAM PLATELET PHERESIS
Blood Product Expiration Date: 202509252359
Blood Product Expiration Date: 202509252359
ISSUE DATE / TIME: 202509231434
ISSUE DATE / TIME: 202509231817
Unit Type and Rh: 6200
Unit Type and Rh: 7300

## 2024-03-10 LAB — COMPREHENSIVE METABOLIC PANEL WITH GFR
ALT: 33 U/L (ref 0–44)
AST: 29 U/L (ref 15–41)
Albumin: 1.9 g/dL — ABNORMAL LOW (ref 3.5–5.0)
Alkaline Phosphatase: 327 U/L — ABNORMAL HIGH (ref 38–126)
Anion gap: 13 (ref 5–15)
BUN: 42 mg/dL — ABNORMAL HIGH (ref 8–23)
CO2: 23 mmol/L (ref 22–32)
Calcium: 8 mg/dL — ABNORMAL LOW (ref 8.9–10.3)
Chloride: 97 mmol/L — ABNORMAL LOW (ref 98–111)
Creatinine, Ser: 1.81 mg/dL — ABNORMAL HIGH (ref 0.61–1.24)
GFR, Estimated: 38 mL/min — ABNORMAL LOW (ref >60)
Glucose, Bld: 164 mg/dL — ABNORMAL HIGH (ref 70–99)
Potassium: 3.6 mmol/L (ref 3.5–5.1)
Sodium: 133 mmol/L — ABNORMAL LOW (ref 135–145)
Total Bilirubin: 1.4 mg/dL — ABNORMAL HIGH (ref 0.0–1.2)
Total Protein: 4.9 g/dL — ABNORMAL LOW (ref 6.5–8.1)

## 2024-03-10 LAB — GLUCOSE, CAPILLARY
Glucose-Capillary: 158 mg/dL — ABNORMAL HIGH (ref 70–99)
Glucose-Capillary: 214 mg/dL — ABNORMAL HIGH (ref 70–99)
Glucose-Capillary: 147 mg/dL — ABNORMAL HIGH (ref 70–99)

## 2024-03-10 LAB — MAGNESIUM: Magnesium: 1.6 mg/dL — ABNORMAL LOW (ref 1.7–2.4)

## 2024-03-10 LAB — PHOSPHORUS: Phosphorus: 4.5 mg/dL (ref 2.5–4.6)

## 2024-03-10 MED ORDER — MEXILETINE HCL 200 MG PO CAPS
200.0000 mg | ORAL_CAPSULE | Freq: Two times a day (BID) | ORAL | 0 refills | Status: DC
  Filled 2024-03-10: qty 60, 30d supply, fill #0

## 2024-03-10 MED ORDER — SODIUM CHLORIDE 0.9 % IV BOLUS: 1000.0000 mL | Freq: Once | INTRAVENOUS | Status: DC

## 2024-03-10 MED ORDER — HEPARIN SOD (PORK) LOCK FLUSH 100 UNIT/ML IV SOLN
500.0000 [IU] | INTRAVENOUS | Status: AC | PRN
  Administered 2024-03-10: 500 [IU]

## 2024-03-10 MED ORDER — FUROSEMIDE 40 MG PO TABS: 40.0000 mg | ORAL_TABLET | Freq: Every day | ORAL | Status: DC

## 2024-03-10 MED ORDER — SODIUM CHLORIDE 0.9 % IV BOLUS: 500.0000 mL | Freq: Once | INTRAVENOUS | Status: DC

## 2024-03-10 MED ORDER — METOPROLOL SUCCINATE ER 25 MG PO TB24
12.5000 mg | ORAL_TABLET | Freq: Every day | ORAL | Status: DC
  Administered 2024-03-10: 12.5 mg via ORAL
  Filled 2024-03-10: qty 1

## 2024-03-10 MED ORDER — SODIUM CHLORIDE 0.9% IV SOLUTION: Freq: Once | INTRAVENOUS | Status: AC

## 2024-03-10 MED ORDER — SODIUM CHLORIDE 0.9 % IV BOLUS
1000.0000 mL | Freq: Once | INTRAVENOUS | Status: AC
  Administered 2024-03-10: 1000 mL via INTRAVENOUS

## 2024-03-10 NOTE — Progress Notes (Signed)
 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 Will follow as able.  Norene Ames, PT, DPT Acute Rehabilitation Services Secure chat preferred Office #: [phone]    Norene CHRISTELLA Ames 03/10/2024, 9:06 AM

## 2024-03-10 NOTE — Plan of Care (Signed)
  Problem: Activity: Goal: Expression of feelings of increased energy will increase Outcome: Progressing   Problem: Education: Goal: Knowledge of disease or condition will improve Outcome: Completed/Met Goal: Verbalization of understanding the information provided will improve Outcome: Completed/Met   Problem: Coping: Goal: Level of anxiety will decrease Outcome: Progressing Goal: Ability to identify and develop effective coping behavior will improve Outcome: Progressing Goal: Verbalization of a cessation or decrease of fear will increase Outcome: Progressing   Problem: Health Behavior/Discharge Planning: Goal: Identification of resources available to assist in meeting health care needs will improve Outcome: Completed/Met   Problem: Medication: Goal: Risk for medication side effects will decrease Outcome: Progressing   Problem: Nutritional: Goal: Maintenance of adequate weight for body size and type will improve Outcome: Progressing   Problem: Physical Regulation: Goal: Ability to maintain a body temperature in the normal range will improve Outcome: Progressing Goal: Will remain free from infection Outcome: Progressing Goal: Complications related to the disease process, condition or treatment will be avoided or minimized Outcome: Progressing   Problem: Safety: Goal: Ability to remain free from injury will improve Outcome: Progressing   Problem: Pain Management: Goal: Pain level will decrease Outcome: Progressing Goal: General experience of comfort will improve Outcome: Progressing   Problem: Skin Integrity: Goal: Status of oral mucous membranes will improve Outcome: Progressing Goal: Skin integrity will improve Outcome: Progressing

## 2024-03-10 NOTE — Progress Notes (Signed)
 OT Cancellation Note  Patient Details Name: Paul BAIL, MD MRN: [id number] DOB: 1946-02-19   Cancelled Treatment:    Reason Eval/Treat Not Completed: Medical issues which prohibited therapy (Per nursing to hold until they have a bolus. Will follow up when able.)  [name]  Acute Rehab Services  [phone] office number  [name] 03/10/2024, 8:35 AM

## 2024-03-10 NOTE — Discharge Summary (Addendum)
 Physician Discharge Summary  [name], MD FMW:[id number] DOB: 1946-03-26 DOA: 03/06/2024  PCP: [name], MD  Admit date: 03/06/2024 Discharge date: 03/10/2024  Admitted From: Home Disposition:  Home  Recommendations for Outpatient Follow-up:  Follow up with with oncology in 1 week Please obtain BMP/CBC in one week   Home Health:Yes Equipment/Devices:None  Discharge Condition:Stable CODE STATUS:Full Diet recommendation: Heart Healthy   Brief/Interim Summary: 78 y.o. male past medical history of AML followed at [facility], CAD TIA who comes into the hospital after syncopal episode, he relates he has been orthostatics for the past 4 to 6 weeks progressive shortness of breath which describes a positional, in the ED was found to be bradycardic EP was consulted metoprolol  was discontinued along with her Aldactone  Jardiance  and Lasix  due to his orthostasis.   Discharge Diagnoses:  Principal Problem:   Generalized weakness  Syncopal episode/orthostatic hypotension: Metoprolol , Lasix , Jardiance  and Aldactone  were held on admission TSH and cortisol were unremarkable. He was given IV fluids and his orthostatics improved.   Acute kidney injury on chronic kidney disease stage IIIa: With a baseline creatinine around 1.2-1.5.  On admission 2.5 He was started on IV fluids and his creatinine improved likely prerenal azotemia   Sinus bradycardia: Improved with holding metoprolol .   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 Previous physician discussed with oncologist Dr. Zeidner who recommended to transfuse platelets. And follow-up with them as an outpatient.  He recommended to continue cefdinir, Hydrea , terbinafine , valacyclovir  and voriconazole .   PAC/PVC: Continue mexiletine per EP.   HFpEF: Toprol  was held due to sinus  bradycardia. Lasix  Aldactone  and Jardiance  were held due to orthostatic hypotension. EP was consulted recommended to increase mexiletine .   Diabetes mellitus type 2: No changes made to his medication continue Farxiga   BPH: Continue Proscar  and Flomax   Discharge Instructions  Discharge Instructions     Ambulatory referral to Physical Therapy   Complete by: As directed    Outpatient Physical Therapy evaluation and treatment.   Diet - low sodium heart healthy   Complete by: As directed    Increase activity slowly   Complete by: As directed       Allergies as of 03/10/2024       Reactions   Lisinopril Other (See Comments), Shortness Of Breath, Swelling, Anaphylaxis   Angioedema Angioedema    Angioedema        Medication List     PAUSE taking these medications    furosemide  40 MG tablet Wait to take this until your doctor or other care provider tells you to start again. Commonly known as: LASIX  Take 1 tablet (40 mg total) by mouth daily.   metoprolol  succinate 25 MG 24 hr tablet Wait to take this until your doctor or other care provider tells you to start again. Commonly known as: Toprol  XL Take 1 tablet (25 mg total) by mouth in the morning and at bedtime.       TAKE these medications    acetaminophen  500 MG tablet Commonly known as: TYLENOL  Take 1,000 mg by mouth every 6 (six) hours as needed for mild pain, moderate pain or headache.   cefdinir 300 MG capsule Commonly known as: OMNICEF Take 300 mg by mouth 2 (two) times daily.   diphenoxylate -atropine  2.5-0.025 MG tablet Commonly known as: LOMOTIL  Take 2 tablets by mouth as needed for diarrhea  or loose stools.   finasteride  5 MG tablet Commonly known as: PROSCAR  Take 5 mg by mouth daily.   hydroxyurea  500 MG capsule Commonly known as: HYDREA  Take 1,000 mg by mouth 2 (two) times daily.   Investigational - Study Medication Take 300 mg by mouth 2 (two) times daily. Study name: Laretta COLT  100mg  tablet Additional study details: Acute myeloid leukemia not having achieved remission  Take 3 tablets (300mg ) by mouth two time a day for 28 days, under fasted or fed conditions, with up to or 8oz of water .   Jardiance  25 MG Tabs tablet Generic drug: empagliflozin  TAKE 1 TABLET BY MOUTH EVERY DAY   Lantus  SoloStar 100 UNIT/ML Solostar Pen Generic drug: insulin  glargine Inject 10 Units into the skin in the morning.   LORazepam  1 MG tablet Commonly known as: ATIVAN  Take 1 mg by mouth at bedtime.   magnesium  oxide 400 (240 Mg) MG tablet Commonly known as: MAG-OX Take 1 tablet by mouth daily.   metFORMIN  500 MG tablet Commonly known as: GLUCOPHAGE  Take 1,000 mg by mouth 2 (two) times daily with a meal.   mexiletine 200 MG capsule Commonly known as: MEXITIL  Take 1 capsule (200 mg total) by mouth 2 (two) times daily. What changed:  medication strength how much to take   ondansetron  8 MG disintegrating tablet Commonly known as: ZOFRAN -ODT Take 8 mg by mouth 3 (three) times daily.   polyethylene glycol 17 g packet Commonly known as: MIRALAX  / GLYCOLAX  Take 17 g by mouth 3 (three) times a week.   rosuvastatin  20 MG tablet Commonly known as: CRESTOR  Take 20 mg by mouth at bedtime.   spironolactone  25 MG tablet Commonly known as: ALDACTONE  Take 1 tablet (25 mg total) by mouth daily.   tamsulosin  0.4 MG Caps capsule Commonly known as: FLOMAX  Take 0.8 mg by mouth at bedtime.   terbinafine  250 MG tablet Commonly known as: LAMISIL  Take 250 mg by mouth daily.   Tresiba FlexTouch 100 UNIT/ML FlexTouch Pen Generic drug: insulin  degludec Inject 5 Units into the skin daily.   valACYclovir  500 MG tablet Commonly known as: VALTREX  Take 500 mg by mouth daily.   voriconazole  200 MG tablet Commonly known as: VFEND  Take 200 mg by mouth 2 (two) times daily.        Follow-up Information     [facility] Follow  up.   Specialty: Rehabilitation Why: Outpatient Physical Therapy-office to call with visit times within 3-5 business days. If the office has not called please feel free to call them. Contact information: [address] [place]  [phone]               Allergies  Allergen Reactions   Lisinopril Other (See Comments), Shortness Of Breath, Swelling and Anaphylaxis    Angioedema  Angioedema    Angioedema    Consultations: Cardiology   Procedures/Studies: DG Chest Portable 1 View Result Date: 03/06/2024 CLINICAL DATA:  Shortness of breath EXAM: PORTABLE CHEST 1 VIEW COMPARISON:  05/05/2019 FINDINGS: Right Port-A-Cath in place with the tip in the right atrium. Heart and mediastinal contours are within normal limits. No focal opacities or effusions. No acute bony abnormality. IMPRESSION: No active disease. Electronically Signed   By: Franky Crease M.D.   On: 03/06/2024 23:17     Subjective: No complaints feels better today.  Discharge Exam: Vitals:   03/10/24 0418 03/10/24 0738  BP:  (!) 132/92  Pulse:  ROLLEN)  113  Resp:  20  Temp: 98.4 F (36.9 C) 97.7 F (36.5 C)  SpO2:  93%   Vitals:   03/09/24 2102 03/09/24 2340 03/10/24 0418 03/10/24 0738  BP: 133/75   (!) 132/92  Pulse: 92   (!) 113  Resp: 17   20  Temp: 98.2 F (36.8 C) 98.9 F (37.2 C) 98.4 F (36.9 C) 97.7 F (36.5 C)  TempSrc: Oral  Oral Oral  SpO2: 95%   93%  Weight:      Height:        General: Pt is alert, awake, not in acute distress Cardiovascular: RRR, S1/S2 +, no rubs, no gallops Respiratory: CTA bilaterally, no wheezing, no rhonchi Abdominal: Soft, NT, ND, bowel sounds + Extremities: no edema, no cyanosis    The results of significant diagnostics from this hospitalization (including imaging, microbiology, ancillary and laboratory) are listed below for reference.     Microbiology: No results found for this or any previous visit (from the past 240  hours).   Labs: BNP (last 3 results) Recent Labs    03/06/24 2250  BNP 251.7*   Basic Metabolic Panel: Recent Labs  Lab 03/06/24 2250 03/08/24 0651 03/09/24 0559 03/10/24 0752  NA 130* 133* 131* 133*  K 4.2 3.7 3.6 3.6  CL 94* 99 95* 97*  CO2 23 23 22 23   GLUCOSE 200* 177* 156* 164*  BUN 47* 44* 45* 42*  CREATININE 2.50* 2.21* 2.18* 1.81*  CALCIUM  7.8* 8.1* 8.0* 8.0*  MG  --   --  1.6* 1.6*  PHOS  --   --  4.8* 4.5   Liver Function Tests: Recent Labs  Lab 03/06/24 2250 03/09/24 0559 03/10/24 0752  AST 27 24 29   ALT 28 28 33  ALKPHOS 135* 232* 327*  BILITOT 0.8 1.3* 1.4*  PROT 4.9* 4.9* 4.9*  ALBUMIN 2.1* 1.9* 1.9*   No results for input(s): LIPASE, AMYLASE in the last 168 hours. No results for input(s): AMMONIA in the last 168 hours. CBC: Recent Labs  Lab 03/07/24 2140 03/08/24 0651 03/09/24 0559 03/09/24 2349 03/10/24 0752  WBC 24.2* 27.0* 27.2* 33.0* 33.4*  NEUTROABS  --  12.2* 12.8* 26.7* 9.1*  HGB 11.4* 11.1* 11.2* 11.1* 11.2*  HCT 32.5* 32.5* 32.4* 32.1* 32.8*  MCV 83.3 84.0 84.4 84.0 84.3  PLT 5* 12* 6* 25* 16*   Cardiac Enzymes: No results for input(s): CKTOTAL, CKMB, CKMBINDEX, TROPONINI in the last 168 hours. BNP: Invalid input(s): POCBNP CBG: Recent Labs  Lab 03/09/24 0746 03/09/24 1128 03/09/24 1617 03/09/24 2048 03/10/24 0729  GLUCAP 147* 227* 158* 176* 158*   D-Dimer No results for input(s): DDIMER in the last 72 hours. Hgb A1c No results for input(s): HGBA1C in the last 72 hours. Lipid Profile No results for input(s): CHOL, HDL, LDLCALC, TRIG, CHOLHDL, LDLDIRECT in the last 72 hours. Thyroid  function studies Recent Labs    03/08/24 0615  TSH 1.432   Anemia work up No results for input(s): VITAMINB12, FOLATE, FERRITIN, TIBC, IRON, RETICCTPCT in the last 72 hours. Urinalysis    Component Value Date/Time   COLORURINE YELLOW 03/07/2024 0024   APPEARANCEUR HAZY (A) 03/07/2024  0024   LABSPEC 1.010 03/07/2024 0024   PHURINE 5.0 03/07/2024 0024   GLUCOSEU >=500 (A) 03/07/2024 0024   HGBUR NEGATIVE 03/07/2024 0024   BILIRUBINUR NEGATIVE 03/07/2024 0024   BILIRUBINUR negative 06/20/2015 1656   BILIRUBINUR small 10/20/2012 1350   KETONESUR NEGATIVE 03/07/2024 0024   PROTEINUR NEGATIVE 03/07/2024 0024  UROBILINOGEN 0.2 06/20/2015 1656   NITRITE NEGATIVE 03/07/2024 0024   LEUKOCYTESUR NEGATIVE 03/07/2024 0024   Sepsis Labs Recent Labs  Lab 03/08/24 0651 03/09/24 0559 03/09/24 2349 03/10/24 0752  WBC 27.0* 27.2* 33.0* 33.4*   Microbiology No results found for this or any previous visit (from the past 240 hours).   Time coordinating discharge: Over 35 minutes  SIGNED:   Erle Odell Castor, MD  [facility] 03/10/2024, 10:59 AM Pager   If 7PM-7AM, please contact night-coverage [email or web address] Password [email or web address]

## 2024-03-10 NOTE — Progress Notes (Incomplete)
 [facility] PROGRESS NOTE    Progress Note  Paul GORMAN Piety, Paul [name]  FMW:[id number] DOB: 08/19/1945 DOA: 03/06/2024 PCP: [name], Paul [name]     Brief Narrative:   Paul GORMAN Piety, Paul [name] is an 78 y.o. male past medical history of AML followed at [facility], CAD TIA who comes into the hospital after syncopal episode, he relates he has been orthostatics for the past 4 to 6 weeks progressive shortness of breath which describes a positional, in the ED was found to be bradycardic EP was consulted metoprolol  was discontinued along with her Aldactone  Jardiance  and Lasix  due to his orthostasis.  Assessment/Plan:   Syncopal episode/orthostatic hypotension: Metoprolol  Lasix  Jardiance  and Aldactone  were held. TSH and cortisol were unremarkable. Started on compression stockings. Awaiting orthostatics this morning. Give him a bolus of normal saline recheck orthostatics.  Acute kidney injury on chronic kidney disease stage IIIa: With a baseline creatinine around 1.2-1.5.  On admission 2.5 Likely hemodynamically mediated, I's and O's poorly recorded. Give a bolus of normal saline recheck basic metabolic panel.  Sinus bradycardia: Improved with holding metoprolol .  AML/severe thrombocytopenia: Platelets less than 10,000 admission he status post 1 unit of platelets and his platelet count this morning is 26,000. Previous physician discussed with oncologist Dr. Zeidner who recommended to transfuse platelets. On 03/07/2024 spoke with the fellow at [facility], [facility], [facility] low suspicions for investigational medication causing cardiac effects.  He recommended to continue cefdinir, Hydrea , terbinafine , valacyclovir  and voriconazole .  Acute kidney injury on chronic kidney disease stage IIIa: On admission 2.5 started on IV fluids now improved to 1.5.  PAC/PVC: Continue mexiletine per EP.  HFpEF: Toprol  was discontinued due to sinus bradycardia. Lasix  Aldactone  and Jardiance  were held due to orthostatic  hypotension.  Diabetes mellitus type 2: Continue sliding scale insulin .  BPH: Continue Proscar  and Flomax    DVT prophylaxis: scd Family Communication:none Status is: Inpatient {Inpatient:23812}    Code Status:     Code Status Orders  (From admission, onward)           Start     Ordered   03/07/24 0222  Full code  Continuous       Question:  By:  Answer:  Consent: discussion documented in EHR   03/07/24 0221           Code Status History     Date Active Date Inactive Code Status Order ID Comments User Context   07/02/2019 1232 07/03/2019 1941 Full Code [id number]  Barbarann Nest, Paul [name] ED   05/07/2019 1226 05/08/2019 1723 Full Code [id number]  Rondall Lynwood, PA-C Inpatient   04/28/2017 1749 04/30/2017 1602 Full Code [id number]  Rondall Lynwood, PA-C Inpatient   06/01/2013 1505 06/02/2013 1208 Full Code [id number]  Kelsie Lynwood, Paul [name] Inpatient         IV Access:   Peripheral IV   Procedures and diagnostic studies:   No results found.   Medical Consultants:   None.   Subjective:    Paul GORMAN Piety, Paul [name]   Objective:    Vitals:   03/09/24 2102 03/09/24 2340 03/10/24 0418 03/10/24 0738  BP: 133/75   (!) 132/92  Pulse: 92   (!) 113  Resp: 17   20  Temp: 98.2 F (36.8 C) 98.9 F (37.2 C) 98.4 F (36.9 C) 97.7 F (36.5 C)  TempSrc: Oral  Oral Oral  SpO2: 95%   93%  Weight:      Height:       SpO2: 93 %   Intake/Output Summary (  Last 24 hours) at 03/10/2024 0811 Last data filed at 03/10/2024 0419 Gross per 24 hour  Intake 854 ml  Output 1475 ml  Net -621 ml   Filed Weights   03/06/24 2113 03/07/24 0354  Weight: 79.4 kg 86.9 kg    Exam: General exam: In no acute distress. Respiratory system: Good air movement and clear to auscultation. Cardiovascular system: S1 & S2 heard, RRR. No JVD, murmurs, rubs, gallops or clicks.  Gastrointestinal system: Abdomen is nondistended, soft and nontender.  Central nervous system: Alert and  oriented. No focal neurological deficits. Extremities: No pedal edema. Skin: No rashes, lesions or ulcers Psychiatry: Judgement and insight appear normal. Mood & affect appropriate.    Data Reviewed:    Labs: Basic Metabolic Panel: Recent Labs  Lab 03/06/24 2250 03/08/24 0651 03/09/24 0559  NA 130* 133* 131*  K 4.2 3.7 3.6  CL 94* 99 95*  CO2 23 23 22   GLUCOSE 200* 177* 156*  BUN 47* 44* 45*  CREATININE 2.50* 2.21* 2.18*  CALCIUM  7.8* 8.1* 8.0*  MG  --   --  1.6*  PHOS  --   --  4.8*   GFR Estimated Creatinine Clearance: 29.8 mL/min (A) (by C-G formula based on SCr of 2.18 mg/dL (H)). Liver Function Tests: Recent Labs  Lab 03/06/24 2250 03/09/24 0559  AST 27 24  ALT 28 28  ALKPHOS 135* 232*  BILITOT 0.8 1.3*  PROT 4.9* 4.9*  ALBUMIN 2.1* 1.9*   No results for input(s): LIPASE, AMYLASE in the last 168 hours. No results for input(s): AMMONIA in the last 168 hours. Coagulation profile No results for input(s): INR, PROTIME in the last 168 hours. COVID-19 Labs  No results for input(s): DDIMER, FERRITIN, LDH, CRP in the last 72 hours.  Lab Results  Component Value Date   SARSCOV2NAA NEGATIVE 07/02/2019   SARSCOV2NAA NOT DETECTED 05/04/2019   SARSCOV2NAA Not Detected 03/16/2019    CBC: Recent Labs  Lab 03/06/24 2352 03/07/24 2140 03/08/24 0651 03/09/24 0559 03/09/24 2349  WBC 28.8* 24.2* 27.0* 27.2* 33.0*  NEUTROABS  --   --  12.2* 12.8* 26.7*  HGB 8.9* 11.4* 11.1* 11.2* 11.1*  HCT 26.3* 32.5* 32.5* 32.4* 32.1*  MCV 84.6 83.3 84.0 84.4 84.0  PLT 6* 5* 12* 6* 25*   Cardiac Enzymes: No results for input(s): CKTOTAL, CKMB, CKMBINDEX, TROPONINI in the last 168 hours. BNP (last 3 results) No results for input(s): PROBNP in the last 8760 hours. CBG: Recent Labs  Lab 03/09/24 0746 03/09/24 1128 03/09/24 1617 03/09/24 2048 03/10/24 0729  GLUCAP 147* 227* 158* 176* 158*   D-Dimer: No results for input(s): DDIMER in  the last 72 hours. Hgb A1c: No results for input(s): HGBA1C in the last 72 hours. Lipid Profile: No results for input(s): CHOL, HDL, LDLCALC, TRIG, CHOLHDL, LDLDIRECT in the last 72 hours. Thyroid  function studies: Recent Labs    03/08/24 0615  TSH 1.432   Anemia work up: No results for input(s): VITAMINB12, FOLATE, FERRITIN, TIBC, IRON, RETICCTPCT in the last 72 hours. Sepsis Labs: Recent Labs  Lab 03/07/24 2140 03/08/24 0651 03/09/24 0559 03/09/24 2349  WBC 24.2* 27.0* 27.2* 33.0*   Microbiology No results found for this or any previous visit (from the past 240 hours).   Medications:    sodium chloride    Intravenous Once   cefUROXime   250 mg Oral BID WC   Chlorhexidine  Gluconate Cloth  6 each Topical Daily   finasteride   5 mg Oral Daily   hydroxyurea   500 mg Oral BID WC   insulin  aspart  0-5 Units Subcutaneous QHS   insulin  aspart  0-9 Units Subcutaneous TID WC   insulin  glargine  5 Units Subcutaneous QHS   LORazepam   1 mg Oral QHS   magnesium  oxide  400 mg Oral Daily   mexiletine  200 mg Oral BID   ondansetron   8 mg Oral TID   polyethylene glycol  17 g Oral Once per day on Monday Wednesday Friday   sodium chloride  flush  10-40 mL Intracatheter Q12H   sodium chloride  flush  10-40 mL Intracatheter Q12H   tamsulosin   0.8 mg Oral QHS   terbinafine   250 mg Oral Daily   valACYclovir   500 mg Oral Daily   voriconazole   200 mg Oral BID   Continuous Infusions:    LOS: 3 days   Erle Odell Castor  [facility]  03/10/2024, 8:11 AM

## 2024-03-10 NOTE — Progress Notes (Signed)
 Physical Therapy Treatment Patient Details Name: Paul DORRIS, MD MRN: [id number] DOB: 1946-03-20 Today's Date: 03/10/2024   History of Present Illness Pt is 78 year old presented to [facility] on  03/06/24 for near syncope causing fall. EMS performed orthostatics and pt became bradycardic PMH - DM, OSA, chronic systolic CHF with AICD placement, hypogonadism, HTN, stage 3 CKD, TKR bil, TIA, acute myeloid leukemia, orthostatic hypotension, CAD with PCI    PT Comments  PT was request check orthostatic BPs during session s/p patient receiving1/2 of his bolus of fluid. Pt also receiving platelets during assessment. Pt denies pain t/o session other than dull headache in which patient reports to be from the chemo. Pt minA for bed mobility. Pt with anterior/posterior sway during static standing during BP assessment requiring minA to steady. Pt became tachycardic up to 158bpm from 110s at rest during standing inhibiting ambulation this date. See below for orthostatic BPs.  BP HOB at 30 deg: 131/87, HR 110s BP sitting EOB: 119/63, HR 128bpm  BP standing s/p 1 min: 98/31, HR up to 158bpm, pt denies feeling like heart was racing BP sitting: 115/69, HR 126-136bpm BP return to supine with HOB at 30 deg: 132/89, HR 120s  At this time I don't feel patient is safe to d/c home due to increased fall risk due to orthostatics. Acute PT to cont to follow.    If plan is discharge home, recommend the following: A little help with walking and/or transfers;A little help with bathing/dressing/bathroom;Help with stairs or ramp for entrance;Assist for transportation   Can travel by private vehicle        Equipment Recommendations  Other (comment) (To be determined)    Recommendations for Other Services       Precautions / Restrictions Precautions Precautions: Fall;Other (comment) (multiple bruises (leukemia)) Recall of Precautions/Restrictions: Intact Precaution/Restrictions Comments: orthostatic, tachycardic  up to 158 Restrictions Weight Bearing Restrictions Per Provider Order: No     Mobility  Bed Mobility Overal bed mobility: Needs Assistance Bed Mobility: Supine to Sit, Sit to Supine     Supine to sit: Min assist Sit to supine: Min assist   General bed mobility comments: pt reached for PT for assist for trunk elevation, minA for LE management back into bed    Transfers Overall transfer level: Needs assistance Equipment used: None Transfers: Sit to/from Stand Sit to Stand: Contact guard assist           General transfer comment: contact guard during orthostatic BP assessment due to pt swaying forwards/backwards, pt HR also increased to 158bpm just in standing from 110s at rest    Ambulation/Gait               General Gait Details: unable due to tachycardia up to 158bpm   Stairs             Wheelchair Mobility     Tilt Bed    Modified Rankin (Stroke Patients Only)       Balance Overall balance assessment: Needs assistance Sitting-balance support: No upper extremity supported, Feet supported Sitting balance-Leahy Scale: Good     Standing balance support: No upper extremity supported, During functional activity Standing balance-Leahy Scale: Fair Standing balance comment: pt required assist with dynamic balance.                            Communication Communication Communication: No apparent difficulties  Cognition Arousal: Alert Behavior During Therapy: [facility] for tasks assessed/performed  PT - Cognitive impairments: No apparent impairments                         Following commands: Intact      Cueing Cueing Techniques: Verbal cues  Exercises      General Comments General comments (skin integrity, edema, etc.): limited by tachycardia and orthostatic BP      Pertinent Vitals/Pain Pain Assessment Pain Assessment: 0-10 Pain Score: 3  Pain Location: head Pain Descriptors / Indicators: Aching Pain  Intervention(s): Premedicated before session    Home Living                          Prior Function            PT Goals (current goals can now be found in the care plan section) Acute Rehab PT Goals Patient Stated Goal: return home PT Goal Formulation: With patient Time For Goal Achievement: 03/22/24 Potential to Achieve Goals: Good Progress towards PT goals: Not progressing toward goals - comment (due to tachycardia and orthostatic BP)    Frequency    Min 3X/week      PT Plan      Co-evaluation              AM-PAC PT 6 Clicks Mobility   Outcome Measure  Help needed turning from your back to your side while in a flat bed without using bedrails?: None Help needed moving from lying on your back to sitting on the side of a flat bed without using bedrails?: None Help needed moving to and from a bed to a chair (including a wheelchair)?: A Little Help needed standing up from a chair using your arms (e.g., wheelchair or bedside chair)?: A Little Help needed to walk in hospital room?: Total Help needed climbing 3-5 steps with a railing? : Total 6 Click Score: 16    End of Session Equipment Utilized During Treatment: Gait belt Activity Tolerance: Treatment limited secondary to medical complications (Comment) (orthostatic hypotension, tachycardia) Patient left: in bed;with call bell/phone within reach;with bed alarm set;with family/visitor present Nurse Communication: Mobility status;Other (comment) (orthostatic, tachycardia) PT Visit Diagnosis: Unsteadiness on feet (R26.81);Other abnormalities of gait and mobility (R26.89);Muscle weakness (generalized) (M62.81)     Time: 1205-1229 PT Time Calculation (min) (ACUTE ONLY): 24 min  Charges:    $Therapeutic Activity: 23-37 mins PT General Charges $$ ACUTE PT VISIT: 1 Visit                     Norene Ames, PT, DPT Acute Rehabilitation Services Secure chat preferred Office #: [phone]    Norene CHRISTELLA Ames 03/10/2024, 2:26 PM

## 2024-03-11 LAB — BPAM PLATELET PHERESIS
Blood Product Expiration Date: 202509262359
ISSUE DATE / TIME: 202509241115
Unit Type and Rh: 7300

## 2024-03-11 LAB — PREPARE PLATELET PHERESIS: Unit division: 0

## 2024-03-16 NOTE — Care Plan (Signed)
 Transition of Care Encounter Data   Call attempt: 2 Admission date: 03/11/24 Discharge date: 03/14/24 Discharge diagnosis: Small right sided subdural hematoma Patient post discharge: Medications:      SABRA   [facility]: [phone]:  .  [facility]: [phone]:  .  Other: Contact PCP:                                                   Transitions Case Management MyChart Follow-Up  Case Manager sent MyChart Followup message to patient's MyChart.

## 2024-03-16 NOTE — Telephone Encounter (Signed)
 Copied from CRM #[id number]. Topic: Care Management - General Questions >> Mar 16, 2024  4:34 PM Harlene NOVAK wrote: Paul [name] with [facility] contacted the Communication Center regarding the following:  - is calling to inform Dr. Carolan that the pt delayed them seeing him today so he will be seen 10/2  Please contact Lisa at [phone].  Thanks in advance,  Harlene Stager [facility]  [phone]

## 2024-03-16 NOTE — Telephone Encounter (Signed)
 Called pt regarding hospital visit. Phone number does not appear to be in service

## 2024-03-17 NOTE — Progress Notes (Addendum)
 Infusion Center Progress Note  Patient Name: Paul Glean Piety, MD Patient Age: 78 y.o. Encounter Date: 03/17/2024  Reason for visit Chief Complaint: generalized fatigue, orthopnea  Today is lab check and assesment for PRBC transfusion  I spent at least 30 minutes with this patient: assessing, performing physical exam with > 50% of time spent in counseling.    Assessment/Plan:  I have discussed the case (exam, laboratory results, and plan) with Paul [name], AGNP. Patient and team are all in agreement with plan outlined below.  AML I DOE I SOB without hypoxia I Orthopnea Dr. 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 He reports chronic fatigue, baseline SOB, and a recent increase in orthopnea. These symptoms are not new, though he reports they have worsened slightly at home. He reports nasal congestion, denies chest pain, fever, chills, or other signs of acute infection. Upon assessment, Chronically ill-appearing, pale, but in no acute distress. Vitals;HR: Irregular, one episode of bradycardia at 59 bpm; otherwise in 100s. BP: Lower than baseline at 110/60 mmHg, SpO2: 100% on room air; Heart: Irregularly irregular rhythm, no murmurs. Lungs: Clear to auscultation bilaterally, no wheezes or crackles. Abdomen: Soft, non-distended, no hepatosplenomegaly. Extremities: 2+ bilateral lower extremity edema, stable; no signs of hypervolemia at this encounter.  Skin: Generalized ecchymosis. Neuro: No focal deficits. Upon lab review HGB today 8.9,plt 3:  hypoalbuminemia at 2.5, and ANC >1.5=3.0, sCr 1.37 consistent with stage 3b CKD.  The patient is a 78 year old male with relapsed/refractory AML presenting with chronic SOB and increased orthopnea. Given clear lung sounds, no evidence of fluid overload, stable SpO2 on room air,  and absence of acute decompensated heart failure, the symptoms are more likely secondary to anemia and disease progression rather than primary cardiopulmonary etiology. The patient is also noted to be mildly hypotensive and bradycardic intermittently, though asymptomatic at this time. Poor appetite and chronic fatigue are likely multifactorial, including disease burden and nutritional status (low albumin).  - Continue with 1 unit of platelet  - Continue with 1 Unit of blood since pt wont be back here to the clinic before 02/19/24 d/t long travel - Obtain RPP to rule out subclinical upper respiratory infection contributing to SOB - Continue to monitor vitals, especially for bradycardia or symptomatic hypotension - Monitor post-transfusion labs and response  Subjective/HPI: Paul [name] is a 78 y.o. year old patient with AML, and PMH of  stage 3b CKD, essential HTN, NICM, CAD, PVCs, paroxysmal Afib, HFpEF, TBI due to a recent fall in 02/2024, and sleep apnea, who presented to infusion clinic today for lab check and assessment for needed PRBC transfusion complains for generalized fatigue, SOB that is not new to him and increased orthopnea at home  Oncology History: Hematology/Oncology History Overview Note  Diagnosis: MDS with TP53 mutation  Presentation: On 03/04/23 presented to local ED with approximately 2 week history of increasing shortness of breath and on that morning some nausea and pain between shoulder blades. Initially thought to be cardiac given extensive history. Labs with WBC 14.2, hgb 10.5, plt 135, ANC 6.4.  03/06/23 bone marrow biopsy A,B. BONE MARROW, LEFT ILIAC (ASPIRATE SMEARS, CORE BIOPSY, AND CLOT SECTION):         -   MARKEDLY HYPERCELLULAR BONE MARROW (~80-90%) WITH DYSGRANULOPOIESIS, DYSERYTHROPOIESIS, AND INCREASED BLASTS (5-7%).  SEE COMMENT. Karyotype: 45,XY,der(2)ins(2;?)(p13;?),del(4)(q21q25),-5,add(17)(q11.2)[1]/45,idem,der(7)t(7;15)(q22;q21),-8,-12,-15,  add(16)(p13.1),del(16)(q22),-17,+21,+r,+23mar[18]/46,XY[1]  Interpretation: ABNORMAL MALE KARYOTYPE - COMPLEX  Molecular Testing- TET2 VAF 75.4% TP53  VAF 77.1%  03/20/2023 BMBx Bone marrow, right iliac, aspiration and biopsy -  Hypercellular bone marrow (60-70%) with left-shifted and dysplastic myelomonocytic cells and 5-10% blasts by CD34 immunohistochemistry  RESULTS  Date Value Ref Range Status  03/20/2023   Preliminary   Abnormal Karyotype:   44~45,XY,der(2)ins(2;?)(p13;?),del(4)(q21q25),-5,der(7)t(7;15)(q22q21),+8,add(8)(p11.2)x2,-12,-15,del(16)(q22q24),der(16)t(16;17)(p13.3;q11.2),-17,der(17)t(5;17)(p13;q?23),+21,+r[9]/46,XY[1]  Abnormal FISH: An EGR1 interphase FISH assay shows an abnormal signal pattern, consistent with the loss of 5q31 material seen by G-banding, in 82% of the 100 cells examined.  Abnormal FISH:  An interphase FISH assay showed a signal pattern consistent with TP53 deletion 85% of the 100 cells examined.           Variants of Known/Likely Clinical Significance:  Gene Coding Predicted Protein Variant allele fraction  TET2 c.3412C>T p.([id number]*) 32.4%  TP53 c.832C>T p.(Pro278Ser) 33.5%    Cycle 1: 04/03/2023 [id number] azacitidine  75mg /m2 x 7 days + AK117 30mg /kg q 2 weeks (or placebo)  Cycle 2: 04/30/2023 [id number] azacitidine  75mg /m2 x 7 days + AK117 30mg /kg q 2 weeks (or placebo)  Diagnosis  Date Value Ref Range Status  05/21/2023   Final   Bone marrow, right iliac, aspiration and biopsy -  Hypercellular bone marrow (70%) involved by persistent myeloid neoplasm (41% blasts/dysplastic myelomonocytic cells by manual aspirate differential, and 10% blasts by CD34 immunohistochemistry) (see Comment)     Cycle 3: 05/28/2023 Cycle 4: 06/25/2023  Diagnosis  Date Value Ref Range Status  07/17/2023   Final   Bone marrow, right iliac, aspiration and biopsy - Hypercellular bone marrow (80%) involved by persistent high-grade myeloid neoplasm (45% blasts/dysplastic  myelomonocytic cells by manual aspirate differential) (see Comment)     TAGALONG clinical trial Cycle 1: 08/04/2023: azacitidine  75mg /m2 x 7 days + tagraxofusp 12mcg/kg D1-3 Cycle 2: 09/01/2023  Diagnosis  Date Value Ref Range Status  09/22/2023   Final   Bone marrow, right iliac, aspiration and biopsy -  Hypercellular bone marrow (50%) with persistent involvement by high-grade myeloid neoplasm (39% blasts/dysplastic myelomonocytic cells by manual aspirate differential) -  See linked reports for associated Ancillary Studies.    This electronic signature is attestation that the pathologist personally reviewed the submitted material(s) and the final diagnosis reflects that evaluation.     Cycle 3: 09/30/2023 Azacitidine /Tagraxofusp/Venetoclax x 21 days  BMbx: 10/20/2023 Diagnosis  Bone marrow, right iliac, aspiration and biopsy -  Normocellular bone marrow (30%) with persistent involvement by high-grade myeloid neoplasm (17% blasts by manual aspirate differential)    Cycle 4: 10/27/2023  Diagnosis  Date Value Ref Range Status  11/17/2023   Final   Bone marrow, right iliac, aspiration and biopsy -  Hypocellular bone marrow (10%) with trilineage hematopoiesis and 1% blasts by manual aspirate differential -  See linked reports for associated Ancillary Studies  - flow MRD 25%     Cycle 5: 11/24/2023  Diagnosis  Date Value Ref Range Status  01/09/2024   Final   Bone marrow, right iliac, aspiration and biopsy -  Hypercellular bone marrow (60%) with persistent acute myeloid leukemia (51% blasts by manual aspirate differential)  -  See linked reports for associated Ancillary Studies.    This electronic signature is attestation that the pathologist personally reviewed the submitted material(s) and the final diagnosis reflects that evaluation.     DSP clinical trial 300mg  BID Cycle 1: 02/09/2024 Cycle 2: 03/15/2024   MDS (myelodysplastic syndrome)    (CMS-HCC)  03/05/2023 Initial  Diagnosis   Suspected Myelodysplastic disease (  CMS-HCC)   Myelodysplastic syndrome    (CMS-HCC)  04/14/2023 Initial Diagnosis   Myelodysplastic syndrome     AML (acute myeloblastic leukemia) (CMS-HCC)  07/25/2023 Initial Diagnosis   AML (acute myeloblastic leukemia)    Allergies: Allergies[1] Medications: Prior to Admission medications  Medication Dose, Route, Frequency  acetaminophen  (TYLENOL ) 325 MG tablet 650 mg, Oral, Every 4 hours PRN  allopurinol (ZYLOPRIM) 300 MG tablet 300 mg, Oral, Daily (standard)  blood-glucose sensor (FREESTYLE LIBRE 3 PLUS SENSOR) Devi Use as directed for glucose monitoring  cefdinir (OMNICEF) 300 MG capsule 300 mg, Oral, 2 times a day (standard)  cetirizine (ZYRTEC) 10 MG tablet 10 mg, Oral, Daily (standard)  empagliflozin  (JARDIANCE ) 25 mg tablet 25 mg, Oral, Daily (standard)  flash glucose sensor (FREESTYLE LIBRE 2 SENSOR) kit USE TO MONITOR BLOOD GLUCOSE CHANGE EVERY 14 DAYS  hydroxyurea  (HYDREA ) 500 mg capsule 2,000 mg, Oral, 2 times a day (standard)  insulin  degludec (TRESIBA FLEXTOUCH U-100) 100 unit/mL (3 mL) InPn 10 Units, Subcutaneous, Daily (standard)  LORazepam  (ATIVAN ) 1 MG tablet 1 mg, Oral, Every 8 hours PRN  magnesium  oxide (MAG-OX) 400 mg (241.3 mg elemental magnesium ) tablet 400 mg, Oral, Daily (standard)  metFORMIN  (GLUCOPHAGE ) 500 MG tablet Take two tablets by mouth twice a day  metoPROLOL  succinate (TOPROL -XL) 25 MG 24 hr tablet 25 mg, Oral, Daily (standard)  mexiletine (MEXITIL ) 200 MG capsule 200 mg, Oral, Every 12 hours  ondansetron  (ZOFRAN -ODT) 8 MG disintegrating tablet 8 mg, Oral, Every 8 hours PRN  pen needle, diabetic 32 gauge x 5/32 (4 mm) Ndle 1 each, Miscellaneous, Daily  polyethylene glycol (MIRALAX ) 17 gram packet 17 g, Oral, Daily PRN  rosuvastatin  (CRESTOR ) 20 MG tablet 20 mg, Oral, Nightly  STUDY [id number]-5336 100 mg tablet 300 mg, Oral, 2 times a day (standard), Take ~12 hrs apart, under fasted or fed  conditions, with up to 240 mL or 8 oz of water .  tamsulosin  (FLOMAX ) 0.4 mg capsule 2 tablets daily for prostate  terbinafine  HCL (LAMISIL ) 250 mg tablet 250 mg, Oral, Daily (standard)  traMADol (ULTRAM) 50 mg tablet Take 1 tablet (50 mg total) by mouth every six (6) hours as needed for pain.  valACYclovir  (VALTREX ) 500 MG tablet 500 mg, Oral, Daily (standard)  voriconazole  (VFEND ) 200 MG tablet 200 mg, Oral, 2 times a day (standard)   Review of Systems: A complete review of systems was obtained including: General/Constitutional, HEENT, Respiratory, Cardiovascular, Gastrointestinal, Musculoskeletal, Neurological, Hematologic/lymphatic systems. It is negative or non-contributory to the patient's management except for positives mentioned in HPI.   Physical Exam: Vital Signs: Wt Readings from Last 1 Encounters:  03/15/24 81.9 kg (180 lb 10.7 oz)   Temp Readings from Last 1 Encounters:  03/17/24 36.9 C (98.4 F) (Oral)   BP Readings from Last 1 Encounters:  03/17/24 113/68   Pulse Readings from Last 1 Encounters:  03/17/24 75   General:               resting comfortably, chronically ill-appearing, cachectic HEENT:               EOMI, reports congestion Cardiovascular:   irregular rate and rhythm Respiratory:         clear to auscultation bilaterally Gastrointestinal:  soft and rounded Musculoskeletal:  swelling in bilateral foot and leg  Extremities:          moderate edema present in bilateral lower extremities Skin:  warm, dry, and generalized bruising Neuro:                 alert & oriented x 3, normal speech CVAD:    - nontender, no erythema or exudate; Dressing CDI.  Results: Hospital Outpatient Visit on 03/17/2024  Component Date Value Ref Range Status  . PRODUCT CODE 03/17/2024 [id number]   Final  . Product ID 03/17/2024 Platelets   Final  . Status 03/17/2024 Transfused   Final  . Unit # 03/17/2024 [id number]   Final  . Unit Blood Type 03/17/2024 A Pos    Final  . ISBT Number 03/17/2024 [id number]   Final  . PRODUCT CODE 03/17/2024 [id number]   Final  . Product ID 03/17/2024 Red Blood Cells   Final  . Specimen Expiration Date 03/17/2024 [id number]   Final  . Status 03/17/2024 Work in progress   Final  . Unit # 03/17/2024 [id number]   Final  . Unit Blood Type 03/17/2024 A Pos   Final  . ISBT Number 03/17/2024 [id number]   Final  . PRODUCT CODE 03/17/2024 [id number]   Final  . Product ID 03/17/2024 Red Blood Cells   Final  . Specimen Expiration Date 03/17/2024 [id number]   Final  . Status 03/17/2024 Work in progress   Final  . Unit # 03/17/2024 [id number]   Final  . Unit Blood Type 03/17/2024 A Pos   Final  . ISBT Number 03/17/2024 [id number]   Final  Lab on 03/17/2024  Component Date Value Ref Range Status  . ABO Grouping 03/17/2024 A POS   Final  . Antibody Screen 03/17/2024 NEG   Final  . WBC 03/17/2024 12.9 (H)  3.6 - 11.2 10*9/L Final  . RBC 03/17/2024 3.09 (L)  4.26 - 5.60 10*12/L Final  . HGB 03/17/2024 8.9 (L)  12.9 - 16.5 g/dL Final  . HCT 89/98/7974 26.8 (L)  39.0 - 48.0 % Final  . MCV 03/17/2024 86.8  77.6 - 95.7 fL Final  . MCH 03/17/2024 28.9  25.9 - 32.4 pg Final  . MCHC 03/17/2024 33.3  32.0 - 36.0 g/dL Final  . RDW 89/98/7974 17.0 (H)  12.2 - 15.2 % Final  . MPV 03/17/2024 12.2 (H)  6.8 - 10.7 fL Final  . Platelet 03/17/2024 3 (LL)  150 - 450 10*9/L Final   Questionable result confirmed. Specimen integrity/identity confirmed.   . Anisocytosis 03/17/2024 Slight (A)  Not Present Final  . Neutrophils % 03/17/2024 23  % Final  . Lymphocytes % 03/17/2024 16  % Final  . Monocytes % 03/17/2024 44  % Final  . Eosinophils % 03/17/2024 0  % Final  . Basophils % 03/17/2024 0  % Final  . Blasts % 03/17/2024 17 (HH)  <=0 % Final  . Absolute Neutrophils 03/17/2024 3.0  1.8 - 7.8 10*9/L Final  . Absolute Lymphocytes 03/17/2024 2.1  1.1 - 3.6 10*9/L Final  . Absolute Monocytes 03/17/2024 5.7 (H)  0.3 - 0.8 10*9/L Final  . Absolute  Eosinophils 03/17/2024 0.0  0.0 - 0.5 10*9/L Final  . Absolute Basophils 03/17/2024 0.0  0.0 - 0.1 10*9/L Final  . Smear Review Comments 03/17/2024 See Comment  Undefined Final   Blasts Present. Agranular neutrophils present. [id number]   Harrie Saltness, AGNP         [1] Allergies Allergen Reactions  . Lisinopril Angioedema, Other (See Comments), Shortness Of Breath and Swelling    Angioedema

## 2024-03-17 NOTE — Progress Notes (Signed)
 Blood and Blood Product Transfusion Consent  Informed consent obtained from Paul [name] for blood transfusion products. The  risks and benefits for receiving blood transfusion products were explained to the patient including but not limited to infection (bacterial or viral) and/or transfusion reactions. The patient gives consent to receive blood transfusion products as ordered and consent will remain on file according to hospital standards.  I mentioned that the patient can decide to stop transfusions at any time. All of the patient's questions were answered to his or her satisfaction. Paul [name] is alert and oriented, expressed understanding, and consented to proceed with the proposed treatment plan.   Harrie Saltness, AG-ACNP Medicine of Oncology -  Infusion Program

## 2024-03-17 NOTE — Progress Notes (Signed)
 Pt presents to the clinic, Chair 16 for the labs.  Labs revealed value of:  , H/H 8.9/26.8, or platelet count 3  Data: vital signs stable, review of systems within parameters, or adverse transfusion reaction reviewed. IV access Right CW port. Pt states that he is feeling very crappy today.  Pt was reporting significant intermittent dyspnea & that LE edema has greatly improved.  Per therapy plan pt is to receive 1 units of PRBCs or 1 unit of platelets.  Action: Therapy plan reviewed, hydration pre-treatment as ordered or positive blood return during administration.  Blood consent expiring soon, so Harrie, NP @ chairside to sign new consent.  Pt declined to receive pre-medications or Lasix  w/ transfusion today.  Pt's repeat platelet count increased to 30.  Respiratory swab completed as requested by Harrie, NP.  Response: tolerated without complaints, positive blood return, no redness or edema at Right chest-wall port-a-cath. Right chest-wall port-a-cath was de-access and heparin  lock .  Site was CDI and free from s/sx of infection.  AVS was decline.  Pt was discharged in stable condition.  Pt was escorted via wheelchair off the unit accompanied by his family to the front of the cancer center where his ride awaits to take him home.

## 2024-03-18 NOTE — Progress Notes (Signed)
   OUTPATIENT SOCIAL WORKER [LCSW] PROGRESS NOTE: INITIAL  Service: Adult Hematology Oncology                                                         Patient Bilyk) is a 78 y.o. male with  a diagnosis of AML. LCSW spoke with the patient by telephone on 03/18/2024.   Referral Source: LCSW received referral from Gwenlyn Pih, [place], requesting LCSW follow up to support/assist patient/family with financial assistance with a rollator.  Assessment/Narrative: Sw called patient to discuss applying to the [employer] Patient [employer] grant. This is a one time grant for $100 that could help offset the cost of the rollator he needed to purchase, as his insurance would not cover this. Patient gave Sw verbal permission to apply to his grant on his behalf.   Intervention(s):   Sw applied to the [employer] Patient [employer] grant. The patient was approved and will receive a check via mail in 7-10 business days.    LCSW Plan for Follow-Up:  LCSW will continue to be available for psychosocial support and resource assistance as needed/appropriate.  Loa Fees, MSW, LCSW Oncology Outpatient Social Worker [phone]

## 2024-03-18 NOTE — Progress Notes (Addendum)
 Per [facility] and Dr. Vickye recommendations, please transfuse hemoglobin less than or equal to 8.5 with two units of PRBC to reduce symptoms of anemia and stress on the heart during treatment. Patient symptomatic with hemoglobin less than 8 so please allow for greater threshold. Please transfuse platelets when counts are less than or equal to 15,000 with recheck at least 30 minutes after completion if possible. If there is active bleeding please increase platelet threshold to 20,000-30,000 range.    In all instances when a transfusion is needed, we recommend frequent lab checks to monitor for drops in count. This patient has required three time weekly transfusion checks with same day transfusion. If unable to do same day transfusion, please schedule patient twice a week and additional transfusion appointments with be scheduled with [facility]. If there are any questions, please contact Lonell Ahle [phone].

## 2024-03-19 ENCOUNTER — Emergency Department (HOSPITAL_COMMUNITY)

## 2024-03-19 ENCOUNTER — Encounter (HOSPITAL_COMMUNITY): Payer: Self-pay

## 2024-03-19 ENCOUNTER — Inpatient Hospital Stay (HOSPITAL_COMMUNITY)
Admission: EM | Admit: 2024-03-19 | Discharge: 2024-03-28 | DRG: 082 | Disposition: A | Discharge status: Other Acute Inpatient Hospital | Attending: Internal Medicine | Admitting: Internal Medicine

## 2024-03-19 ENCOUNTER — Other Ambulatory Visit: Payer: Self-pay

## 2024-03-19 DIAGNOSIS — Z9842 Cataract extraction status, left eye: Secondary | ICD-10-CM

## 2024-03-19 DIAGNOSIS — Z818 Family history of other mental and behavioral disorders: Secondary | ICD-10-CM

## 2024-03-19 DIAGNOSIS — N400 Enlarged prostate without lower urinary tract symptoms: Secondary | ICD-10-CM | POA: Diagnosis present

## 2024-03-19 DIAGNOSIS — I472 Ventricular tachycardia, unspecified: Secondary | ICD-10-CM | POA: Diagnosis present

## 2024-03-19 DIAGNOSIS — C9200 Acute myeloblastic leukemia, not having achieved remission: Secondary | ICD-10-CM | POA: Diagnosis present

## 2024-03-19 DIAGNOSIS — I5022 Chronic systolic (congestive) heart failure: Secondary | ICD-10-CM | POA: Diagnosis not present

## 2024-03-19 DIAGNOSIS — I4819 Other persistent atrial fibrillation: Secondary | ICD-10-CM | POA: Diagnosis not present

## 2024-03-19 DIAGNOSIS — Z888 Allergy status to other drugs, medicaments and biological substances status: Secondary | ICD-10-CM

## 2024-03-19 DIAGNOSIS — I951 Orthostatic hypotension: Secondary | ICD-10-CM | POA: Diagnosis present

## 2024-03-19 DIAGNOSIS — D6959 Other secondary thrombocytopenia: Secondary | ICD-10-CM | POA: Diagnosis present

## 2024-03-19 DIAGNOSIS — Z833 Family history of diabetes mellitus: Secondary | ICD-10-CM

## 2024-03-19 DIAGNOSIS — I4891 Unspecified atrial fibrillation: Secondary | ICD-10-CM | POA: Diagnosis present

## 2024-03-19 DIAGNOSIS — Z95810 Presence of automatic (implantable) cardiac defibrillator: Secondary | ICD-10-CM

## 2024-03-19 DIAGNOSIS — I2510 Atherosclerotic heart disease of native coronary artery without angina pectoris: Secondary | ICD-10-CM | POA: Diagnosis present

## 2024-03-19 DIAGNOSIS — I5042 Chronic combined systolic (congestive) and diastolic (congestive) heart failure: Secondary | ICD-10-CM | POA: Diagnosis present

## 2024-03-19 DIAGNOSIS — E871 Hypo-osmolality and hyponatremia: Secondary | ICD-10-CM | POA: Diagnosis present

## 2024-03-19 DIAGNOSIS — Y92009 Unspecified place in unspecified non-institutional (private) residence as the place of occurrence of the external cause: Secondary | ICD-10-CM | POA: Diagnosis not present

## 2024-03-19 DIAGNOSIS — E1122 Type 2 diabetes mellitus with diabetic chronic kidney disease: Secondary | ICD-10-CM | POA: Diagnosis present

## 2024-03-19 DIAGNOSIS — I7121 Aneurysm of the ascending aorta, without rupture: Secondary | ICD-10-CM | POA: Diagnosis present

## 2024-03-19 DIAGNOSIS — I10 Essential (primary) hypertension: Secondary | ICD-10-CM | POA: Diagnosis present

## 2024-03-19 DIAGNOSIS — Z823 Family history of stroke: Secondary | ICD-10-CM

## 2024-03-19 DIAGNOSIS — I130 Hypertensive heart and chronic kidney disease with heart failure and stage 1 through stage 4 chronic kidney disease, or unspecified chronic kidney disease: Secondary | ICD-10-CM | POA: Diagnosis present

## 2024-03-19 DIAGNOSIS — N179 Acute kidney failure, unspecified: Secondary | ICD-10-CM | POA: Diagnosis present

## 2024-03-19 DIAGNOSIS — Z743 Need for continuous supervision: Secondary | ICD-10-CM | POA: Diagnosis not present

## 2024-03-19 DIAGNOSIS — L89156 Pressure-induced deep tissue damage of sacral region: Secondary | ICD-10-CM | POA: Diagnosis present

## 2024-03-19 DIAGNOSIS — R627 Adult failure to thrive: Secondary | ICD-10-CM | POA: Diagnosis present

## 2024-03-19 DIAGNOSIS — D696 Thrombocytopenia, unspecified: Secondary | ICD-10-CM

## 2024-03-19 DIAGNOSIS — C9290 Myeloid leukemia, unspecified, not having achieved remission: Secondary | ICD-10-CM

## 2024-03-19 DIAGNOSIS — G9341 Metabolic encephalopathy: Secondary | ICD-10-CM | POA: Diagnosis present

## 2024-03-19 DIAGNOSIS — I255 Ischemic cardiomyopathy: Secondary | ICD-10-CM | POA: Diagnosis present

## 2024-03-19 DIAGNOSIS — N1832 Chronic kidney disease, stage 3b: Secondary | ICD-10-CM | POA: Diagnosis present

## 2024-03-19 DIAGNOSIS — Z9861 Coronary angioplasty status: Secondary | ICD-10-CM

## 2024-03-19 DIAGNOSIS — S065XAA Traumatic subdural hemorrhage with loss of consciousness status unknown, initial encounter: Secondary | ICD-10-CM | POA: Diagnosis present

## 2024-03-19 DIAGNOSIS — E119 Type 2 diabetes mellitus without complications: Secondary | ICD-10-CM | POA: Diagnosis not present

## 2024-03-19 DIAGNOSIS — Z7984 Long term (current) use of oral hypoglycemic drugs: Secondary | ICD-10-CM

## 2024-03-19 DIAGNOSIS — D649 Anemia, unspecified: Secondary | ICD-10-CM | POA: Diagnosis not present

## 2024-03-19 DIAGNOSIS — I493 Ventricular premature depolarization: Secondary | ICD-10-CM | POA: Diagnosis present

## 2024-03-19 DIAGNOSIS — Z8673 Personal history of transient ischemic attack (TIA), and cerebral infarction without residual deficits: Secondary | ICD-10-CM

## 2024-03-19 DIAGNOSIS — E11649 Type 2 diabetes mellitus with hypoglycemia without coma: Secondary | ICD-10-CM | POA: Diagnosis present

## 2024-03-19 DIAGNOSIS — Z96653 Presence of artificial knee joint, bilateral: Secondary | ICD-10-CM | POA: Diagnosis present

## 2024-03-19 DIAGNOSIS — D61818 Other pancytopenia: Secondary | ICD-10-CM | POA: Diagnosis present

## 2024-03-19 DIAGNOSIS — Z8249 Family history of ischemic heart disease and other diseases of the circulatory system: Secondary | ICD-10-CM

## 2024-03-19 DIAGNOSIS — Z794 Long term (current) use of insulin: Secondary | ICD-10-CM

## 2024-03-19 DIAGNOSIS — E785 Hyperlipidemia, unspecified: Secondary | ICD-10-CM | POA: Diagnosis present

## 2024-03-19 DIAGNOSIS — I480 Paroxysmal atrial fibrillation: Secondary | ICD-10-CM | POA: Diagnosis present

## 2024-03-19 DIAGNOSIS — R402413 Glasgow coma scale score 13-15, at hospital admission: Secondary | ICD-10-CM | POA: Diagnosis present

## 2024-03-19 DIAGNOSIS — I4892 Unspecified atrial flutter: Secondary | ICD-10-CM | POA: Diagnosis present

## 2024-03-19 DIAGNOSIS — Z66 Do not resuscitate: Secondary | ICD-10-CM | POA: Diagnosis present

## 2024-03-19 DIAGNOSIS — R000 Tachycardia, unspecified: Secondary | ICD-10-CM | POA: Diagnosis not present

## 2024-03-19 DIAGNOSIS — I5020 Unspecified systolic (congestive) heart failure: Secondary | ICD-10-CM | POA: Diagnosis not present

## 2024-03-19 DIAGNOSIS — I420 Dilated cardiomyopathy: Secondary | ICD-10-CM | POA: Diagnosis present

## 2024-03-19 DIAGNOSIS — G4733 Obstructive sleep apnea (adult) (pediatric): Secondary | ICD-10-CM | POA: Diagnosis present

## 2024-03-19 DIAGNOSIS — Z87892 Personal history of anaphylaxis: Secondary | ICD-10-CM

## 2024-03-19 DIAGNOSIS — Z811 Family history of alcohol abuse and dependence: Secondary | ICD-10-CM

## 2024-03-19 DIAGNOSIS — Z813 Family history of other psychoactive substance abuse and dependence: Secondary | ICD-10-CM

## 2024-03-19 DIAGNOSIS — Z825 Family history of asthma and other chronic lower respiratory diseases: Secondary | ICD-10-CM

## 2024-03-19 DIAGNOSIS — R531 Weakness: Secondary | ICD-10-CM | POA: Diagnosis not present

## 2024-03-19 DIAGNOSIS — Z9841 Cataract extraction status, right eye: Secondary | ICD-10-CM

## 2024-03-19 DIAGNOSIS — W19XXXA Unspecified fall, initial encounter: Secondary | ICD-10-CM | POA: Diagnosis present

## 2024-03-19 DIAGNOSIS — Z810 Family history of intellectual disabilities: Secondary | ICD-10-CM

## 2024-03-19 DIAGNOSIS — L89159 Pressure ulcer of sacral region, unspecified stage: Secondary | ICD-10-CM | POA: Diagnosis present

## 2024-03-19 DIAGNOSIS — Z86718 Personal history of other venous thrombosis and embolism: Secondary | ICD-10-CM

## 2024-03-19 DIAGNOSIS — Z961 Presence of intraocular lens: Secondary | ICD-10-CM | POA: Diagnosis present

## 2024-03-19 DIAGNOSIS — E876 Hypokalemia: Secondary | ICD-10-CM | POA: Diagnosis present

## 2024-03-19 HISTORY — DX: Acute embolism and thrombosis of unspecified deep veins of unspecified lower extremity: I82.409

## 2024-03-19 LAB — COMPREHENSIVE METABOLIC PANEL WITH GFR
ALT: 13 U/L (ref 0–44)
AST: 12 U/L — ABNORMAL LOW (ref 15–41)
Albumin: 2.3 g/dL — ABNORMAL LOW (ref 3.5–5.0)
Alkaline Phosphatase: 94 U/L (ref 38–126)
Anion gap: 14 (ref 5–15)
BUN: 45 mg/dL — ABNORMAL HIGH (ref 8–23)
CO2: 20 mmol/L — ABNORMAL LOW (ref 22–32)
Calcium: 7.7 mg/dL — ABNORMAL LOW (ref 8.9–10.3)
Chloride: 96 mmol/L — ABNORMAL LOW (ref 98–111)
Creatinine, Ser: 1.76 mg/dL — ABNORMAL HIGH (ref 0.61–1.24)
GFR, Estimated: 40 mL/min — ABNORMAL LOW (ref >60)
Glucose, Bld: 297 mg/dL — ABNORMAL HIGH (ref 70–99)
Potassium: 3.5 mmol/L (ref 3.5–5.1)
Sodium: 130 mmol/L — ABNORMAL LOW (ref 135–145)
Total Bilirubin: 1.2 mg/dL (ref 0.0–1.2)
Total Protein: 5.1 g/dL — ABNORMAL LOW (ref 6.5–8.1)

## 2024-03-19 LAB — CBC WITH DIFFERENTIAL/PLATELET
Abs Immature Granulocytes: 0.81 K/uL — ABNORMAL HIGH (ref 0.00–0.07)
Basophils Absolute: 0 K/uL (ref 0.0–0.1)
Basophils Relative: 0 %
Eosinophils Absolute: 0 K/uL (ref 0.0–0.5)
Eosinophils Relative: 0 %
HCT: 24.4 % — ABNORMAL LOW (ref 39.0–52.0)
Hemoglobin: 8 g/dL — ABNORMAL LOW (ref 13.0–17.0)
Immature Granulocytes: 10 %
Lymphocytes Relative: 10 %
Lymphs Abs: 0.8 K/uL (ref 0.7–4.0)
MCH: 28.7 pg (ref 26.0–34.0)
MCHC: 32.8 g/dL (ref 30.0–36.0)
MCV: 87.5 fL (ref 80.0–100.0)
Monocytes Absolute: 3.8 K/uL — ABNORMAL HIGH (ref 0.1–1.0)
Monocytes Relative: 48 %
Neutro Abs: 2.6 K/uL (ref 1.7–7.7)
Neutrophils Relative %: 32 %
Platelets: <5 K/uL — CL (ref 150–400)
RBC: 2.79 MIL/uL — ABNORMAL LOW (ref 4.22–5.81)
RDW: 16.2 % — ABNORMAL HIGH (ref 11.5–15.5)
WBC: 8 K/uL (ref 4.0–10.5)
nRBC: 0 % (ref 0.0–0.2)

## 2024-03-19 LAB — PATHOLOGIST SMEAR REVIEW

## 2024-03-19 LAB — MAGNESIUM: Magnesium: 1.7 mg/dL (ref 1.7–2.4)

## 2024-03-19 LAB — TROPONIN I (HIGH SENSITIVITY)
Troponin I (High Sensitivity): 11 ng/L (ref <18)
Troponin I (High Sensitivity): 11 ng/L (ref <18)

## 2024-03-19 LAB — GLUCOSE, CAPILLARY
Glucose-Capillary: 56 mg/dL — ABNORMAL LOW (ref 70–99)
Glucose-Capillary: 76 mg/dL (ref 70–99)
Glucose-Capillary: 78 mg/dL (ref 70–99)
Glucose-Capillary: 89 mg/dL (ref 70–99)

## 2024-03-19 LAB — D-DIMER, QUANTITATIVE: D-Dimer, Quant: 2.37 ug{FEU}/mL — ABNORMAL HIGH (ref 0.00–0.50)

## 2024-03-19 LAB — APTT: aPTT: 26 s (ref 24–36)

## 2024-03-19 LAB — PROTIME-INR
INR: 1.3 — ABNORMAL HIGH (ref 0.8–1.2)
Prothrombin Time: 16.7 s — ABNORMAL HIGH (ref 11.4–15.2)

## 2024-03-19 LAB — PREPARE RBC (CROSSMATCH)

## 2024-03-19 LAB — MRSA NEXT GEN BY PCR, NASAL: MRSA by PCR Next Gen: NOT DETECTED

## 2024-03-19 LAB — BRAIN NATRIURETIC PEPTIDE: B Natriuretic Peptide: 259 pg/mL — ABNORMAL HIGH (ref 0.0–100.0)

## 2024-03-19 MED ORDER — VORICONAZOLE 200 MG PO TABS
200.0000 mg | ORAL_TABLET | Freq: Two times a day (BID) | ORAL | Status: DC
  Administered 2024-03-19 – 2024-03-28 (×18): 200 mg via ORAL
  Filled 2024-03-19 (×20): qty 1

## 2024-03-19 MED ORDER — FAMOTIDINE 20 MG PO TABS
20.0000 mg | ORAL_TABLET | Freq: Two times a day (BID) | ORAL | Status: DC
  Administered 2024-03-19 – 2024-03-28 (×18): 20 mg via ORAL
  Filled 2024-03-19 (×18): qty 1

## 2024-03-19 MED ORDER — CALCIUM GLUCONATE-NACL 1-0.675 GM/50ML-% IV SOLN
1.0000 g | Freq: Once | INTRAVENOUS | Status: AC
Start: 2024-03-19 — End: 2024-03-19
  Administered 2024-03-19: 1000 mg via INTRAVENOUS
  Filled 2024-03-19: qty 50

## 2024-03-19 MED ORDER — SODIUM CHLORIDE 0.9% IV SOLUTION: Freq: Once | INTRAVENOUS | Status: AC

## 2024-03-19 MED ORDER — ROSUVASTATIN CALCIUM 20 MG PO TABS
20.0000 mg | ORAL_TABLET | Freq: Every day | ORAL | Status: DC
  Administered 2024-03-19 – 2024-03-27 (×9): 20 mg via ORAL
  Filled 2024-03-19 (×9): qty 1

## 2024-03-19 MED ORDER — CHLORHEXIDINE GLUCONATE CLOTH 2 % EX PADS
6.0000 | MEDICATED_PAD | Freq: Every day | CUTANEOUS | Status: DC
  Administered 2024-03-19 – 2024-03-28 (×10): 6 via TOPICAL

## 2024-03-19 MED ORDER — LABETALOL HCL 5 MG/ML IV SOLN: 10.0000 mg | INTRAVENOUS | Status: DC | PRN

## 2024-03-19 MED ORDER — TAMSULOSIN HCL 0.4 MG PO CAPS
0.8000 mg | ORAL_CAPSULE | Freq: Every day | ORAL | Status: DC
  Administered 2024-03-19 – 2024-03-22 (×4): 0.8 mg via ORAL
  Filled 2024-03-19 (×4): qty 2

## 2024-03-19 MED ORDER — DOCUSATE SODIUM 100 MG PO CAPS: 100.0000 mg | ORAL_CAPSULE | Freq: Two times a day (BID) | ORAL | Status: DC | PRN

## 2024-03-19 MED ORDER — HYDROXYUREA 500 MG PO CAPS
2000.0000 mg | ORAL_CAPSULE | Freq: Two times a day (BID) | ORAL | Status: DC
  Administered 2024-03-20 – 2024-03-21 (×4): 2000 mg via ORAL
  Filled 2024-03-19 (×6): qty 4

## 2024-03-19 MED ORDER — DEXTROSE 50 % IV SOLN
12.5000 g | INTRAVENOUS | Status: AC
  Administered 2024-03-19: 12.5 g via INTRAVENOUS
  Filled 2024-03-19: qty 50

## 2024-03-19 MED ORDER — IOHEXOL 350 MG/ML SOLN
75.0000 mL | Freq: Once | INTRAVENOUS | Status: AC | PRN
  Administered 2024-03-19: 75 mL via INTRAVENOUS

## 2024-03-19 MED ORDER — ONDANSETRON HCL 4 MG/2ML IJ SOLN: 4.0000 mg | Freq: Four times a day (QID) | INTRAMUSCULAR | Status: DC | PRN

## 2024-03-19 MED ORDER — VALACYCLOVIR HCL 500 MG PO TABS
500.0000 mg | ORAL_TABLET | Freq: Every day | ORAL | Status: DC
  Administered 2024-03-20 – 2024-03-28 (×9): 500 mg via ORAL
  Filled 2024-03-19 (×9): qty 1

## 2024-03-19 MED ORDER — LEVETIRACETAM (KEPPRA) 500 MG/5 ML ADULT IV PUSH
500.0000 mg | Freq: Two times a day (BID) | INTRAVENOUS | Status: DC
  Administered 2024-03-19 – 2024-03-28 (×18): 500 mg via INTRAVENOUS
  Filled 2024-03-19 (×18): qty 5

## 2024-03-19 MED ORDER — SODIUM CHLORIDE 0.9% FLUSH
10.0000 mL | Freq: Two times a day (BID) | INTRAVENOUS | Status: DC
  Administered 2024-03-19 – 2024-03-21 (×4): 10 mL
  Administered 2024-03-21: 30 mL
  Administered 2024-03-22 – 2024-03-28 (×11): 10 mL

## 2024-03-19 MED ORDER — CEFUROXIME AXETIL 250 MG PO TABS
500.0000 mg | ORAL_TABLET | Freq: Two times a day (BID) | ORAL | Status: DC
  Filled 2024-03-19: qty 2

## 2024-03-19 MED ORDER — OXYCODONE HCL 5 MG PO TABS
5.0000 mg | ORAL_TABLET | Freq: Four times a day (QID) | ORAL | Status: DC | PRN
  Administered 2024-03-19 – 2024-03-21 (×3): 5 mg via ORAL
  Filled 2024-03-19 (×3): qty 1

## 2024-03-19 MED ORDER — POLYETHYLENE GLYCOL 3350 17 G PO PACK: 17.0000 g | PACK | Freq: Every day | ORAL | Status: DC | PRN

## 2024-03-19 MED ORDER — FINASTERIDE 5 MG PO TABS
5.0000 mg | ORAL_TABLET | Freq: Every day | ORAL | Status: DC
  Administered 2024-03-21 – 2024-03-28 (×8): 5 mg via ORAL
  Filled 2024-03-19 (×10): qty 1

## 2024-03-19 MED ORDER — MEXILETINE HCL 200 MG PO CAPS
200.0000 mg | ORAL_CAPSULE | Freq: Two times a day (BID) | ORAL | Status: DC
  Administered 2024-03-19 – 2024-03-28 (×18): 200 mg via ORAL
  Filled 2024-03-19 (×20): qty 1

## 2024-03-19 MED ORDER — INSULIN ASPART 100 UNIT/ML IJ SOLN
0.0000 [IU] | INTRAMUSCULAR | Status: DC
  Administered 2024-03-20 (×2): 3 [IU] via SUBCUTANEOUS
  Administered 2024-03-20: 2 [IU] via SUBCUTANEOUS
  Administered 2024-03-21: 8 [IU] via SUBCUTANEOUS
  Administered 2024-03-21 (×3): 3 [IU] via SUBCUTANEOUS
  Administered 2024-03-21 – 2024-03-22 (×2): 5 [IU] via SUBCUTANEOUS
  Administered 2024-03-22: 8 [IU] via SUBCUTANEOUS
  Administered 2024-03-22: 2 [IU] via SUBCUTANEOUS
  Administered 2024-03-22 – 2024-03-23 (×2): 3 [IU] via SUBCUTANEOUS
  Administered 2024-03-23: 8 [IU] via SUBCUTANEOUS
  Administered 2024-03-23 – 2024-03-24 (×7): 3 [IU] via SUBCUTANEOUS
  Administered 2024-03-24: 2 [IU] via SUBCUTANEOUS
  Administered 2024-03-24 – 2024-03-25 (×4): 3 [IU] via SUBCUTANEOUS
  Administered 2024-03-25: 2 [IU] via SUBCUTANEOUS
  Administered 2024-03-25: 3 [IU] via SUBCUTANEOUS
  Administered 2024-03-25: 2 [IU] via SUBCUTANEOUS
  Administered 2024-03-26 (×3): 3 [IU] via SUBCUTANEOUS
  Administered 2024-03-26: 2 [IU] via SUBCUTANEOUS
  Administered 2024-03-26: 3 [IU] via SUBCUTANEOUS
  Administered 2024-03-26: 2 [IU] via SUBCUTANEOUS
  Administered 2024-03-27: 5 [IU] via SUBCUTANEOUS
  Administered 2024-03-27: 8 [IU] via SUBCUTANEOUS
  Administered 2024-03-27 (×3): 3 [IU] via SUBCUTANEOUS
  Administered 2024-03-27: 5 [IU] via SUBCUTANEOUS
  Administered 2024-03-28 (×2): 3 [IU] via SUBCUTANEOUS
  Administered 2024-03-28 (×3): 5 [IU] via SUBCUTANEOUS

## 2024-03-19 MED ORDER — SODIUM CHLORIDE 0.9% FLUSH: 10.0000 mL | INTRAVENOUS | Status: DC | PRN

## 2024-03-19 NOTE — Progress Notes (Addendum)
 eLink Physician-Brief Progress Note Patient Name: Paul PHILLIPS, MD DOB: Feb 20, 1946 MRN: [id number]   Date of Service  03/19/2024  HPI/Events of Note  78 yo M PMH AML undergoing tx at [facility], SDH HTN, HLD who presented to [facility] ED 10/3 w CC SOB, CT angiogram of chest was done, PE was ruled out, also patient complained of generalized weakness and increasing headache.  Found to have acute on subacute bilateral subdural hematomas in the setting of his known severe thrombocytopenia.  Admitted for neuroevaluation.  No immediate surgical intervention.  Vital signs are stable with 94 saturation on room air.  Results consistent with elevated creatinine, pancytopenia with severe thrombocytopenia less than 5.  Initial CT reviewed, interval image pending  eICU Interventions  Repeat head CT in 8 hours per neurosurgery   Continue Keppra 500 mg twice daily for seizure prophylaxis   Transfuse blood if hemoglobin drops less than 8, transfuse for platelets less than 100, although that seems difficult to achieve  Add oxycodone  for analgesia  Add labetalol for SBP greater than 150  DVT prophylaxis with SCDs GI prophylaxis-add famotidine    2025 -disconnect patient's insulin  pump, transition to standard sliding scale insulin .  Hypoglycemia protocol as needed.  Maintain n.p.o. except meds  0040 -during platelet transfusion, the patient has had 2 100 F from 98.4 prior.  Technically no fever, continue transfusion for now.  If the fever curve continues to rise, will need to perform transfusion reaction workup per protocol  0359 - multiple rounds of hypoglycemia tonight, add D10 gtt, maintain NPO   Intervention Category Evaluation Type: New Patient Evaluation  Hewitt Garner 03/19/2024, 7:41 PM

## 2024-03-19 NOTE — ED Provider Notes (Signed)
 Patient is a 78 year old who presents with shortness of breath and fatigue.  He has a history of AML.  Care was taken over from Dr. Doretha awaiting CTA of his chest.  CTA does not show any evidence of acute PE.  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  He will need periodic follow-up on this.  I initially reached out to Dr. Tobie with the hospital service who was planning on admitting the patient.  However while I was talking to the patient about the plan, he mentioned that he had had a worsening headache during the night.  He does not need anything for pain but did mention that he has previously had a recent subdural hematoma.  Given this and his low platelet count, head CT was ordered.  This shows a 12 mm acute on subacute subdural hematoma on the right and 5 mm on the left.  No midline shift but there is some effacement of the sulci.  Appears to be enlarged based on prior read from care everywhere.  Neurosurgery paged. Discussed with Luke, APP, who recommended repeat head CT in 8 hours and starting Keppra 500BID.   I did reach out to the intensivist, Ronnald, who will plan to admit the patient to the ICU.  Patient is in the process of getting platelet and blood transfusions.  CRITICAL CARE Performed by: Andrea Ness Total critical care time: 60 minutes Critical care time was exclusive of separately billable procedures and treating other patients. Critical care was necessary to treat or prevent imminent or life-threatening deterioration. Critical care was time spent personally by me on the following activities: development of treatment plan with patient and/or surrogate as well as nursing, discussions with consultants, evaluation of patient's response to treatment, examination of patient, obtaining history from patient or surrogate, ordering and performing treatments and interventions, ordering and review of laboratory studies, ordering and review of radiographic studies, pulse  oximetry and re-evaluation of patient's condition.    Ness Andrea, MD 03/19/24 [phone]

## 2024-03-19 NOTE — ED Triage Notes (Signed)
 Pt BIB [facility] from home d/t generalized weakness & SOB that has worsened over the past 2 wks. Does reports recently being in ED 2 wks ago from a syncopal episode & was given a transfusion. Does have a port on his Rt side, Hx of Leukemia, is currently taking treatments for that. EMS reports 12L good, is tachy at 110 bpm, initial BP was 98 palp when sitting then 112/74, 97% on RA, CBG 300, took 20 units insulin  at home at 1000 this AM, A/Ox4.

## 2024-03-19 NOTE — ED Provider Notes (Signed)
 Paul [name] Ronald Salvitti [name] Provider Note   CSN: [id number] Arrival date & time: 03/19/24  1103     Patient presents with: Weakness and Shortness of Breath   Paul GORMAN Piety, Paul [name] is a 78 y.o. male.   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  Patient reports in the last 6 weeks he started a new oral medication daily for his AML and has been following closely with [facility].  He has noticed a gradual worsening of his dyspnea on exertion but in the last 4 days to a week its become severe.  He last saw his doctor 2 days ago and at that time had a hemoglobin of 8.9 and a platelet count of 3 and received a unit of blood and platelets.  He reports since this time he has not gotten any better.  Now he is having a hard time getting up and doing anything because he is so winded every time he tries to do even minimal activity.  He feels generally weak but denies any localized pain.  He has not had any significant cough or fever.  He denies any congestion.  He has been eating and drinking but states his appetite is down a little.  He denies any vomiting or diarrhea.  He last had viral panel testing a few days ago which was negative and denies any sick contacts at home.  He has not had any bloody stools and has been making urine regularly.  He does report ongoing swelling in his lower extremities but does not feel that it is any different than his baseline.  He reports checking his weight regularly and it has been stable.  He does not take diuretics because he reports it bottoms out his blood pressure.  He also reports a prior history of a blood clot in his leg but because of his platelets being so low he cannot be anticoagulated.  He has not had any new falls since the last 1 that was documented in  September.  Patient reports that his weakness has gotten really severe over the last week because prior to this he was still working and now he cannot even get up and walk more than a few feet.  The history is provided by the patient, medical records and the EMS personnel.  Shortness of Breath      Prior to Admission medications   Medication Sig Start Date End Date Taking? Authorizing Provider  acetaminophen  (TYLENOL ) 500 MG tablet Take 1,000 mg by mouth every 6 (six) hours as needed for mild pain, moderate pain or headache.     Provider, Historical, Paul [name]  cefdinir (OMNICEF) 300 MG capsule Take 300 mg by mouth 2 (two) times daily.    Provider, Historical, Paul [name]  diphenoxylate -atropine  (LOMOTIL ) 2.5-0.025 MG tablet Take 2 tablets by mouth as needed for diarrhea or loose stools. 08/27/23   Provider, Historical, Paul [name]  finasteride  (PROSCAR ) 5 MG tablet Take 5 mg by mouth daily. 01/16/24   Provider, Historical, Paul [name]  furosemide  (LASIX ) 40 MG tablet Take 1 tablet (40 mg total) by mouth daily. 03/15/24   Paul Celinda Balo, Paul [name]  hydroxyurea  (HYDREA ) 500 MG capsule Take 1,000 mg by mouth 2 (two) times daily. 03/05/24 05/04/24  Provider, Historical, Paul [name]  insulin  glargine (LANTUS   SOLOSTAR) 100 UNIT/ML Solostar Pen Inject 10 Units into the skin in the morning.    Provider, Historical, Paul [name]  Investigational - Study Medication Take 300 mg by mouth 2 (two) times daily. Study name: Laretta COLT 100mg  tablet Additional study details: Acute myeloid leukemia not having achieved remission  Take 3 tablets (300mg ) by mouth two time a day for 28 days, under fasted or fed conditions, with up to or 8oz of water .    Provider, Historical, Paul [name]  JARDIANCE  25 MG TABS tablet TAKE 1 TABLET BY MOUTH EVERY DAY 03/02/24   Paul File, Paul [name]  LORazepam  (ATIVAN ) 1 MG tablet Take 1 mg by mouth at bedtime. 02/02/24   Provider, Historical, Paul [name]  magnesium  oxide (MAG-OX) 400 (240 Mg) MG tablet Take 1 tablet by mouth daily. 02/21/24    Provider, Historical, Paul [name]  metFORMIN  (GLUCOPHAGE ) 500 MG tablet Take 1,000 mg by mouth 2 (two) times daily with a meal.    Provider, Historical, Paul [name]  metoprolol  succinate (TOPROL  XL) 25 MG 24 hr tablet Take 1 tablet (25 mg total) by mouth in the morning and at bedtime. 12/29/23   Paul Ezra RAMAN, Paul [name]  mexiletine (MEXITIL ) 200 MG capsule Take 1 capsule (200 mg total) by mouth 2 (two) times daily. 03/10/24   Paul Celinda Balo, Paul [name]  ondansetron  (ZOFRAN -ODT) 8 MG disintegrating tablet Take 8 mg by mouth 3 (three) times daily. 09/18/23   Provider, Historical, Paul [name]  polyethylene glycol (MIRALAX  / GLYCOLAX ) 17 g packet Take 17 g by mouth 3 (three) times a week. 08/13/23   Provider, Historical, Paul [name]  rosuvastatin  (CRESTOR ) 20 MG tablet Take 20 mg by mouth at bedtime.    Provider, Historical, Paul [name]  spironolactone  (ALDACTONE ) 25 MG tablet Take 1 tablet (25 mg total) by mouth daily. 11/03/23   Paul Ezra RAMAN, Paul [name]  tamsulosin  (FLOMAX ) 0.4 MG CAPS capsule Take 0.8 mg by mouth at bedtime.    Provider, Historical, Paul [name]  terbinafine  (LAMISIL ) 250 MG tablet Take 250 mg by mouth daily. 02/14/24   Provider, Historical, Paul [name]  TRESIBA FLEXTOUCH 100 UNIT/ML FlexTouch Pen Inject 5 Units into the skin daily. 02/21/24   Provider, Historical, Paul [name]  valACYclovir  (VALTREX ) 500 MG tablet Take 500 mg by mouth daily.    Provider, Historical, Paul [name]  voriconazole  (VFEND ) 200 MG tablet Take 200 mg by mouth 2 (two) times daily. 02/09/24   Provider, Historical, Paul [name]    Allergies: Lisinopril    Review of Systems  Respiratory:  Positive for shortness of breath.     Updated Vital Signs BP (!) 134/96   Pulse (!) 112   Temp 98 F (36.7 C)   Resp 19   Ht 5' 10 (1.778 m)   Wt 80.4 kg   SpO2 100%   BMI 25.43 kg/m   Physical Exam Vitals and nursing note reviewed.  Constitutional:      General: He is not in acute distress.    Appearance: He is well-developed. He is ill-appearing.     Comments: Chronically ill-appearing  HENT:     Head:  Normocephalic and atraumatic.  Eyes:     Conjunctiva/sclera: Conjunctivae normal.     Pupils: Pupils are equal, round, and reactive to light.  Cardiovascular:     Rate and Rhythm: Tachycardia present. Rhythm irregularly irregular.     Heart sounds: No murmur heard. Pulmonary:     Effort: Pulmonary effort is normal. Tachypnea present. No respiratory distress.     Breath sounds: Examination of the right-lower field reveals decreased breath sounds.  Examination of the left-lower field reveals decreased breath sounds. Decreased breath sounds present. No wheezing or rales.  Chest:     Chest wall: No tenderness.  Abdominal:     General: There is no distension.     Palpations: Abdomen is soft.     Tenderness: There is no abdominal tenderness. There is no guarding or rebound.  Musculoskeletal:        General: No tenderness. Normal range of motion.     Cervical back: Normal range of motion and neck supple.     Right lower leg: Edema present.     Left lower leg: Edema present.     Comments: 2-3+ edema present in bilateral lower extremities  Skin:    General: Skin is warm and dry.     Findings: No erythema or rash.     Comments: Ecchymosis diffusely over the trunk upper and lower extremities in various stages of healing  Neurological:     General: No focal deficit present.     Mental Status: He is alert and oriented to person, place, and time.     Comments: Patient is awake alert and appropriate mental status.  Able to move all extremities but generally weak  Psychiatric:        Mood and Affect: Mood normal.        Behavior: Behavior normal.     (all labs ordered are listed, but only abnormal results are displayed) Labs Reviewed  CBC WITH DIFFERENTIAL/PLATELET - Abnormal; Notable for the following components:      Result Value   RBC 2.79 (*)    Hemoglobin 8.0 (*)    HCT 24.4 (*)    RDW 16.2 (*)    Platelets <5 (*)    Monocytes Absolute 3.8 (*)    Abs Immature Granulocytes 0.81 (*)     All other components within normal limits  BRAIN NATRIURETIC PEPTIDE - Abnormal; Notable for the following components:   B Natriuretic Peptide 259.0 (*)    All other components within normal limits  COMPREHENSIVE METABOLIC PANEL WITH GFR - Abnormal; Notable for the following components:   Sodium 130 (*)    Chloride 96 (*)    CO2 20 (*)    Glucose, Bld 297 (*)    BUN 45 (*)    Creatinine, Ser 1.76 (*)    Calcium  7.7 (*)    Total Protein 5.1 (*)    Albumin 2.3 (*)    AST 12 (*)    GFR, Estimated 40 (*)    All other components within normal limits  PROTIME-INR - Abnormal; Notable for the following components:   Prothrombin Time 16.7 (*)    INR 1.3 (*)    All other components within normal limits  PATHOLOGIST SMEAR REVIEW  D-DIMER, QUANTITATIVE  TYPE AND SCREEN  PREPARE PLATELET PHERESIS  PREPARE RBC (CROSSMATCH)  TROPONIN I (HIGH SENSITIVITY)  TROPONIN I (HIGH SENSITIVITY)    EKG: EKG Interpretation Date/Time:  Friday March 19 2024 11:21:20 EDT Ventricular Rate:  106 PR Interval:    QRS Duration:  162 QT Interval:  351 QTC Calculation: 467 R Axis:   -74  Text Interpretation: recurrent Atrial flutter with predominant 3:1 AV block Right bundle branch block Baseline wander in lead(s) I III aVL Confirmed by Doretha Folks ([id number]) on 03/19/2024 11:25:19 AM  Radiology: [facility] Chest Port 1 View Result Date: 03/19/2024 CLINICAL DATA:  Shortness of breath and weakness. EXAM: PORTABLE CHEST 1 VIEW COMPARISON:  03/06/2024 FINDINGS: Right IJ Port-A-Cath with  tip over the SVC. Lungs are adequately inflated and otherwise clear. Cardiomediastinal silhouette and remainder of the exam is unchanged. IMPRESSION: No acute cardiopulmonary disease. Electronically Signed   By: Paul [name] M.D.   On: 03/19/2024 11:53     Procedures   Medications Ordered in the ED  0.9 %  sodium chloride  infusion (Manually program via Guardrails IV Fluids) ( Intravenous New Bag/Given 03/19/24 1355)                                     Medical Decision Making Amount and/or Complexity of Data Reviewed Labs: ordered. Radiology: ordered.  Risk Prescription drug management.   Pt with multiple medical problems and comorbidities and presenting today with a complaint that caries a high risk for morbidity and mortality.  Here today with worsening shortness of breath.  Concern for anemia, PE, pericardial effusion, pleural effusions, CHF, dysrhythmia.  Lower suspicion for pneumonia or ACS.  Also concern for possible renal insufficiency.  Patient is not having any specific symptoms concerning for infection at this time.  2:11 PM I independently interpreted patient's EKG and labs and patient has atrial flutter today with an irregular rhythm which she does have a history of but is changed from his past EKG. CBC today with hemoglobin back down to 8.0, and white count has now normalized to 8.0 which patient reports he got medication for that and platelet count is still less than 5.  BNP is unchanged at 259 which is similar to prior and CMP with creatinine of 1.76 which is improved from prior, mild hyponatremia of 130 and potassium normal at 3.5, INR slightly elevated at 1.3 and troponin of 11.  I have independently visualized and interpreted pt's images today.  Chest x-ray within normal limits. On repeat evaluation discussed the findings with the patient.  He reports anytime his hemoglobin drops to 8 he feels very weak and could be that that is the cause but will also ensure that this is not a PE by first checking D-dimer due to renal status to see if the scan can be avoided. 2:27 PM Dimer is elevated and will get CT to r/o PE.      Final diagnoses:  None    ED Discharge Orders     None          Doretha Folks, Paul [name] 03/19/24 1515

## 2024-03-19 NOTE — Plan of Care (Signed)
 Spoke w [facility] / on call oncology fellow to discuss case. In alignment with admitting to [facility].    Ronnald Gave MSN, AGACNP-BC [facility] Pulmonary/Critical Care Medicine 03/19/2024, 7:10 PM

## 2024-03-19 NOTE — Consult Note (Signed)
 NAME:  Paul GORMAN Piety, MD, MRN:  [id number], DOB:  Nov 16, 1945, LOS: 0 ADMISSION DATE:  03/19/2024, CONSULTATION DATE:  03/19/24 REFERRING MD:  Lenor, CHIEF COMPLAINT:  SDH   History of Present Illness:  78 yo M PMH AML undergoing tx at [facility], SDH HTN, HLD who presented to [facility] ED 10/3 w CC SOB, CT angiogram of chest was done, PE was ruled out, also patient complained of generalized weakness and increasing headache.  Recently he was diagnosed with subacute subdural hemorrhage with increasing headache, CT head was done which showed acute on subacute bilateral subdural hematoma.  Also patient was noted to have platelet count of 5, he was started on platelet infusion, PCCM was consulted for help evaluation medical management.  Of note neurosurgery was called, they recommend no surgical intervention as of now, repeat head CT in 8 hours Patient denies fever, cough, chills, nausea and vomiting  Pertinent  Medical History   Past Medical History:  Diagnosis Date   Aftercare following removal/replacement defibrillator    a. defibrillator removed 03/2019.   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 rate 11, height 5' 10 (1.778 m), weight 80.4 kg, SpO2 100%.        Intake/Output Summary (Last 24 hours) at 03/19/2024 1835 Last data filed at 03/19/2024 1521 Gross per 24 hour  Intake --  Output 300 ml  Net -300 ml   Filed Weights   03/19/24 1118  Weight: 80.4 kg    Examination: General: Chronically ill-appearing male, lying on the bed HEENT: [place]/AT, eyes anicteric.  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  Transfuse blood if hemoglobin drops less than 8 Patient will [facility] with [facility] for AML Hold GDMT for  now, resume once patient is stable His A1c is 7.3 Continue sliding scale insulin  with CBG goal 140-180   Labs   CBC: Recent Labs  Lab 03/19/24 1135  WBC 8.0  NEUTROABS 2.6  HGB 8.0*  HCT 24.4*  MCV 87.5  PLT <5*    Basic Metabolic Panel: Recent Labs  Lab 03/19/24 1135  NA 130*  K 3.5  CL 96*  CO2 20*  GLUCOSE 297*  BUN 45*  CREATININE 1.76*  CALCIUM  7.7*   GFR: Estimated Creatinine Clearance: 36.9 mL/min (A) (by C-G formula  based on SCr of 1.76 mg/dL (H)). Recent Labs  Lab 03/19/24 1135  WBC 8.0    Liver Function Tests: Recent Labs  Lab 03/19/24 1135  AST 12*  ALT 13  ALKPHOS 94  BILITOT 1.2  PROT 5.1*  ALBUMIN 2.3*   No results for input(s): LIPASE, AMYLASE in the last 168 hours. No results for input(s): AMMONIA in the last 168 hours.  ABG    Component Value Date/Time   TCO2 27 07/29/2019 1445     Coagulation Profile: Recent Labs  Lab 03/19/24 1135  INR 1.3*    Cardiac Enzymes: No results for input(s): CKTOTAL, CKMB, CKMBINDEX, TROPONINI in the last 168 hours.  HbA1C: Hemoglobin A1C  Date/Time Value Ref Range Status  06/01/2021 09:26 AM 6.9 (A) 4.0 - 5.6 % Final  09/12/2020 09:27 AM 6.4 (A) 4.0 - 5.6 % Final  07/23/2018 12:00 AM 7.3  Final   Hgb A1c MFr Bld  Date/Time Value Ref Range Status  03/06/2024 11:52 PM 7.3 (H) 4.8 - 5.6 % Final    Comment:    (NOTE)         Prediabetes: 5.7 - 6.4         Diabetes: >6.4         Glycemic control for adults with diabetes: <7.0   05/07/2019 01:07 PM 7.0 (H) 4.8 - 5.6 % Final    Comment:    (NOTE) Pre diabetes:          5.7%-6.4% Diabetes:              >6.4% Glycemic control for   <7.0% adults with diabetes     CBG: No results for input(s): GLUCAP in the last 168 hours.  Review of Systems:   12 point review of system is significant for complaint mentioned in HPI, rest is negative  Past Medical History:  He,  has a past medical history of Aftercare following removal/replacement defibrillator, Arthritis, Cardiomyopathy, dilated, nonischemic (HCC) (10/20/2012), Cataract, Chronic systolic heart failure (HCC), CKD (chronic kidney disease), stage III (HCC), Coronary artery disease, DVT (deep vein thrombosis) in pregnancy, Dysrhythmia, Erectile dysfunction, Fatty liver (2014), GERD (gastroesophageal reflux disease), Hearing aid worn, Hypertension, Hypogonadism male, Hypovitaminosis D, ICD (implantable  cardioverter-defibrillator) lead failure--diminished R wave (03/06/2015), NICM (nonischemic cardiomyopathy) (HCC), Sleep apnea, Type II diabetes mellitus (HCC), Ventricular tachycardia (HCC), and Wears glasses.   Surgical History:   Past Surgical History:  Procedure Laterality Date   ABLATION  06-01-2013   PVC's ablated along basal inferoseptal RV by Dr Kelsie   APPENDECTOMY  1969   CARDIAC CATHETERIZATION  04/2010   CARDIAC DEFIBRILLATOR PLACEMENT  05/09/2010   MDT ICD implanted in [place] by Dr Junette   CATARACT EXTRACTION W/ INTRAOCULAR LENS  IMPLANT, BILATERAL Bilateral    ICD LEAD REMOVAL N/A 03/19/2019   Procedure: ICD LEAD AND GENERATOR REMOVAL;  Surgeon: Waddell Danelle ORN, MD;  Location: [facility] OR;  Service: Cardiovascular;  Laterality: N/A;   KNEE ARTHROSCOPY Left 1984   LUMBAR LAMINECTOMY  ~ 2004   TONSILLECTOMY AND ADENOIDECTOMY  1972   TOTAL KNEE ARTHROPLASTY Left 04/28/2017   TOTAL KNEE ARTHROPLASTY Left 04/28/2017   Procedure: TOTAL KNEE ARTHROPLASTY LEFT;  Surgeon: Yvone Rush, MD;  Location: [facility] OR;  Service: Orthopedics;  Laterality: Left;   TOTAL KNEE ARTHROPLASTY Right 05/07/2019   Procedure: TOTAL KNEE ARTHROPLASTY;  Surgeon: Yvone Rush, MD;  Location: [facility] ORS;  Service: Orthopedics;  Laterality: Right;   V-TACH ABLATION N/A 06/01/2013   Procedure: V-TACH ABLATION;  Surgeon: Lynwood JONETTA Rakers, MD;  Location: [facility] CATH LAB;  Service: Cardiovascular;  Laterality: N/A;   VASECTOMY     VENTRICULAR ABLATION SURGERY  06/01/2013     Social History:   reports that he has never smoked. He has never used smokeless tobacco. He reports current alcohol  use. He reports that he does not use drugs.   Family History:  His family history includes Alcohol  abuse in his brother and mother; COPD in his father and mother; Cancer in his father; Coronary artery disease in his brother; Depression in his mother; Diabetes in his sister; Drug abuse in his brother; Heart disease in his brother, father,  and mother; Hypertension in his brother, father, mother, and sister; Learning disabilities in his sister; Mental retardation in his sister; Stroke in his father and mother. There is no history of Colon cancer, Pancreatic cancer, Esophageal cancer, Stomach cancer, Liver disease, or Rectal cancer.   Allergies Allergies  Allergen Reactions   Lisinopril Other (See Comments), Shortness Of Breath, Swelling and Anaphylaxis    Angioedema  Angioedema    Angioedema     Home Medications  Prior to Admission medications   Medication Sig Start Date End Date Taking? Authorizing Provider  acetaminophen  (TYLENOL ) 500 MG tablet Take 1,000 mg by mouth every 6 (six) hours as needed for mild pain, moderate pain or headache.     [provider]  cefdinir (OMNICEF) 300 MG capsule Take 300 mg by mouth 2 (two) times daily.    [provider]  diphenoxylate -atropine  (LOMOTIL ) 2.5-0.025 MG tablet Take 2 tablets by mouth as needed for diarrhea or loose stools. 08/27/23   [provider]  finasteride  (PROSCAR ) 5 MG tablet Take 5 mg by mouth daily. 01/16/24   [provider]  furosemide  (LASIX ) 40 MG tablet Take 1 tablet (40 mg total) by mouth daily. 03/15/24   Odell Celinda Balo, MD  hydroxyurea  (HYDREA ) 500 MG capsule Take 1,000 mg by mouth 2 (two) times daily. 03/05/24 05/04/24  [provider]  insulin  glargine (LANTUS  SOLOSTAR) 100 UNIT/ML Solostar Pen Inject 10 Units into the skin in the morning.    [provider]  Investigational - Study Medication Take 300 mg by mouth 2 (two) times daily. Study name: Dulib [id number] 100mg  tablet Additional study details: Acute myeloid leukemia not having achieved remission  Take 3 tablets (300mg ) by mouth two time a day for 28 days, under fasted or fed conditions, with up to or 8oz of water .    [provider]  JARDIANCE  25 MG TABS tablet TAKE 1 TABLET BY MOUTH EVERY DAY 03/02/24   [name], MD   LORazepam  (ATIVAN ) 1 MG tablet Take 1 mg by mouth at bedtime. 02/02/24   [provider]  magnesium  oxide (MAG-OX) 400 (240 Mg) MG tablet Take 1 tablet by mouth daily. 02/21/24   [provider]  metFORMIN  (GLUCOPHAGE ) 500 MG tablet Take 1,000 mg by  mouth 2 (two) times daily with a meal.    [provider]  mexiletine (MEXITIL ) 200 MG capsule Take 1 capsule (200 mg total) by mouth 2 (two) times daily. 03/10/24   Odell Celinda Balo, MD  ondansetron  (ZOFRAN -ODT) 8 MG disintegrating tablet Take 8 mg by mouth 3 (three) times daily. 09/18/23   [provider]  polyethylene glycol (MIRALAX  / GLYCOLAX ) 17 g packet Take 17 g by mouth 3 (three) times a week. 08/13/23   [provider]  rosuvastatin  (CRESTOR ) 20 MG tablet Take 20 mg by mouth at bedtime.    [provider]  spironolactone  (ALDACTONE ) 25 MG tablet Take 1 tablet (25 mg total) by mouth daily. 11/03/23   Rolan Ezra RAMAN, MD  tamsulosin  (FLOMAX ) 0.4 MG CAPS capsule Take 0.8 mg by mouth at bedtime.    [provider]  terbinafine  (LAMISIL ) 250 MG tablet Take 250 mg by mouth daily. 02/14/24   [provider]  TRESIBA FLEXTOUCH 100 UNIT/ML FlexTouch Pen Inject 5 Units into the skin daily. 02/21/24   [provider]  valACYclovir  (VALTREX ) 500 MG tablet Take 500 mg by mouth daily.    [provider]  voriconazole  (VFEND ) 200 MG tablet Take 200 mg by mouth 2 (two) times daily. 02/09/24   [provider]     The patient is critically ill due to acute on subacute subdural hemorrhage.  Critical care was necessary to treat or prevent imminent or life-threatening deterioration.  Critical care was time spent personally by me on the following activities: development of treatment plan with patient and/or surrogate as well as nursing, discussions with consultants, evaluation of patient's response to treatment, examination of patient, obtaining history from patient or  surrogate, ordering and performing treatments and interventions, ordering and review of laboratory studies, ordering and review of radiographic studies, pulse oximetry, re-evaluation of patient's condition and participation in multidisciplinary rounds.   During this encounter critical care time was devoted to patient care services described in this note for 38 minutes.     Valinda Novas, MD [facility] See Amion for pager If no response to pager, please call [phone] until 7pm After 7pm, Please call E-link [phone]

## 2024-03-19 NOTE — ED Notes (Signed)
 Patient transported to CT

## 2024-03-19 NOTE — H&P (Signed)
 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 He began to note SOB that was thought to be cardiac etiology when he was found to have new onset anemia. He was seen by Dr. 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 He enrolled on AK117 clinical trial and began treatment on 04/03/2023. He completed 2 cycles with follow up BMBx showing 41% blasts. 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 He then enrolled on TAGALONG clinical trial and began treatment on 08/04/2023. He achieved a morphologic remission with residual MRD after 4 cycles of treatment. He ultimately completed 6 cycles and then relapsed. He enrolled on DSP clinical trial and began treatment 02/09/2024. After cycle 1 he was found to have persistent disease. presenting today for shortness of breath and generalized weakness.   ED Course:  Vital signs in the ED were notable for the following:  Vitals:   03/19/24 1700 03/19/24 1735 03/19/24 1749 03/19/24 1800  BP: 120/72 117/73 120/70 120/67  Pulse: 88 98 90 96  Temp: 97.7 F (36.5 C) 97.9 F (36.6 C) 98 F (36.7 C)   Resp: 14 14 14 11   Height:      Weight:      SpO2: 100%  100% 100% 100%  TempSrc: Oral Oral Oral   BMI (Calculated):      >>ED evaluation thus far shows: Patient found to be severely thrombocytopenic with a platelet count less than 5000 no obvious bleeding or bruising noted. Also anemic with a hemoglobin of 8.0 normal white count. Patient did receive 1 unit of platelet I have ordered 3 more along with PRBCs. Patient had elevated D-dimer of 2.37 with a negative CT angio chest for PE. CMP shows sodium 130 chloride 96 bicarb 20 glucose 297 BUN 45 creatinine 1.76 calcium  7.7 normal LFTs albumin 2.3. Initial BNP of 259, troponin flat x 2 at 11. CBC shows anemia with a hemoglobin of 8.0 EKG shows a flutter at 105 QRS of 167 right bundle branch block QTc of 509 PVCs.  >>While in the ED patient received the following: Medications  0.9 %  sodium chloride  infusion (Manually program via Guardrails IV Fluids) (has no administration in time range)  calcium  gluconate 1 g/ 50 mL sodium chloride  IVPB (has no administration in time range)  0.9 %  sodium chloride  infusion (Manually program via Guardrails IV Fluids) (0 mLs Intravenous Stopped 03/19/24 1419)  iohexol  (OMNIPAQUE ) 350 MG/ML injection 75 mL (75 mLs Intravenous Contrast Given 03/19/24 1504)   Review of Systems  Respiratory:  Positive for shortness of breath.    Past Medical History:  Diagnosis Date   Aftercare following removal/replacement defibrillator  a. defibrillator removed 03/2019.   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   Erectile dysfunction    Fatty liver 2014   GERD (gastroesophageal reflux disease)    Hearing aid worn    B/L   Hypertension    Hypogonadism male    Hypovitaminosis  D    ICD (implantable cardioverter-defibrillator) lead failure--diminished R wave 03/06/2015   NICM (nonischemic cardiomyopathy) (HCC)    Sleep apnea    cpap   Type II diabetes mellitus (HCC)    Ventricular tachycardia (HCC)    observed during stress testing, subsequent VT on EPS   Wears glasses    Past Surgical History:  Procedure Laterality Date   ABLATION  06-01-2013   PVC's ablated along basal inferoseptal RV by Dr Kelsie   APPENDECTOMY  1969   CARDIAC CATHETERIZATION  04/2010   CARDIAC DEFIBRILLATOR PLACEMENT  05/09/2010   MDT ICD implanted in [place] by Dr Junette   CATARACT EXTRACTION W/ INTRAOCULAR LENS  IMPLANT, BILATERAL Bilateral    ICD LEAD REMOVAL N/A 03/19/2019   Procedure: ICD LEAD AND GENERATOR REMOVAL;  Surgeon: Waddell Danelle ORN, MD;  Location: [facility] OR;  Service: Cardiovascular;  Laterality: N/A;   KNEE ARTHROSCOPY Left 1984   LUMBAR LAMINECTOMY  ~ 2004   TONSILLECTOMY AND ADENOIDECTOMY  1972   TOTAL KNEE ARTHROPLASTY Left 04/28/2017   TOTAL KNEE ARTHROPLASTY Left 04/28/2017   Procedure: TOTAL KNEE ARTHROPLASTY LEFT;  Surgeon: Yvone Rush, MD;  Location: [facility] OR;  Service: Orthopedics;  Laterality: Left;   TOTAL KNEE ARTHROPLASTY Right 05/07/2019   Procedure: TOTAL KNEE ARTHROPLASTY;  Surgeon: Yvone Rush, MD;  Location: [facility] ORS;  Service: Orthopedics;  Laterality: Right;   V-TACH ABLATION N/A 06/01/2013   Procedure: V-TACH ABLATION;  Surgeon: Lynwood JONETTA Kelsie, MD;  Location: [facility] CATH LAB;  Service: Cardiovascular;  Laterality: N/A;   VASECTOMY     VENTRICULAR ABLATION SURGERY  06/01/2013    reports that he has never smoked. He has never used smokeless tobacco. He reports current alcohol  use. He reports that he does not use drugs. Allergies  Allergen Reactions   Lisinopril Other (See Comments), Shortness Of Breath, Swelling and Anaphylaxis    Angioedema  Angioedema    Angioedema   Family History  Problem Relation Age of Onset   Alcohol  abuse Mother    COPD  Mother    Depression Mother    Heart disease Mother    Hypertension Mother    Stroke Mother    COPD Father    Heart disease Father    Hypertension Father    Stroke Father    Cancer Father    Diabetes Sister    Mental retardation Sister    Learning disabilities Sister    Hypertension Sister    Alcohol  abuse Brother    Drug abuse Brother    Heart disease Brother    Hypertension Brother    Coronary artery disease Brother    Colon cancer Neg Hx    Pancreatic cancer Neg Hx    Esophageal cancer Neg Hx    Stomach cancer Neg Hx    Liver disease Neg Hx  Rectal cancer Neg Hx    Prior to Admission medications   Medication Sig Start Date End Date Taking? Authorizing Provider  acetaminophen  (TYLENOL ) 500 MG tablet Take 1,000 mg by mouth every 6 (six) hours as needed for mild pain, moderate pain or headache.     [provider]  cefdinir (OMNICEF) 300 MG capsule Take 300 mg by mouth 2 (two) times daily.    [provider]  diphenoxylate -atropine  (LOMOTIL ) 2.5-0.025 MG tablet Take 2 tablets by mouth as needed for diarrhea or loose stools. 08/27/23   [provider]  finasteride  (PROSCAR ) 5 MG tablet Take 5 mg by mouth daily. 01/16/24   [provider]  furosemide  (LASIX ) 40 MG tablet Take 1 tablet (40 mg total) by mouth daily. 03/15/24   Odell Celinda Balo, MD  hydroxyurea  (HYDREA ) 500 MG capsule Take 1,000 mg by mouth 2 (two) times daily. 03/05/24 05/04/24  [provider]  insulin  glargine (LANTUS  SOLOSTAR) 100 UNIT/ML Solostar Pen Inject 10 Units into the skin in the morning.    [provider]  Investigational - Study Medication Take 300 mg by mouth 2 (two) times daily. Study name: Dulib [id number] 100mg  tablet Additional study details: Acute myeloid leukemia not having achieved remission  Take 3 tablets (300mg ) by mouth two time a day for 28 days, under fasted or fed conditions, with up to or 8oz of water .    [provider]  JARDIANCE  25 MG TABS tablet TAKE 1 TABLET BY MOUTH EVERY DAY 03/02/24   [name], MD  LORazepam  (ATIVAN ) 1 MG tablet Take 1 mg by mouth at bedtime. 02/02/24   [provider]  magnesium  oxide (MAG-OX) 400 (240 Mg) MG tablet Take 1 tablet by mouth daily. 02/21/24   [provider]  metFORMIN  (GLUCOPHAGE ) 500 MG tablet Take 1,000 mg by mouth 2 (two) times daily with a meal.    [provider]  metoprolol  succinate (TOPROL  XL) 25 MG 24 hr tablet Take 1 tablet (25 mg total) by mouth in the morning and at bedtime. 12/29/23   Rolan Ezra RAMAN, MD  mexiletine (MEXITIL ) 200 MG capsule Take 1 capsule (200 mg total) by mouth 2 (two) times daily. 03/10/24   Odell Celinda Balo, MD  ondansetron  (ZOFRAN -ODT) 8 MG disintegrating tablet Take 8 mg by mouth 3 (three) times daily. 09/18/23   [provider]  polyethylene glycol (MIRALAX  / GLYCOLAX ) 17 g packet Take 17 g by mouth 3 (three) times a week. 08/13/23   [provider]  rosuvastatin  (CRESTOR ) 20 MG tablet Take 20 mg by mouth at bedtime.    [provider]  spironolactone  (ALDACTONE ) 25 MG tablet Take 1 tablet (25 mg total) by mouth daily. 11/03/23   Rolan Ezra RAMAN, MD  tamsulosin  (FLOMAX ) 0.4 MG CAPS capsule Take 0.8 mg by mouth at bedtime.    [provider]  terbinafine  (LAMISIL ) 250 MG tablet Take 250 mg by mouth daily. 02/14/24   [provider]  TRESIBA FLEXTOUCH 100 UNIT/ML FlexTouch Pen Inject 5 Units into the skin daily. 02/21/24   [provider]  valACYclovir  (VALTREX ) 500 MG tablet Take 500 mg by mouth daily.    [provider]  voriconazole  (VFEND ) 200 MG tablet Take 200 mg by mouth 2 (two) times daily. 02/09/24   [provider]  Vitals:   03/19/24 1700 03/19/24 1735 03/19/24 1749 03/19/24 1800  BP: 120/72 117/73 120/70 120/67  Pulse: 88 98 90 96   Resp: 14 14 14 11   Temp: 97.7 F (36.5 C) 97.9 F (36.6 C) 98 F (36.7 C)   TempSrc: Oral Oral Oral   SpO2: 100% 100% 100% 100%  Weight:      Height:        Labs on Admission: I have personally reviewed following labs and imaging studies CBC: Recent Labs  Lab 03/19/24 1135  WBC 8.0  NEUTROABS 2.6  HGB 8.0*  HCT 24.4*  MCV 87.5  PLT <5*   Basic Metabolic Panel: Recent Labs  Lab 03/19/24 1135  NA 130*  K 3.5  CL 96*  CO2 20*  GLUCOSE 297*  BUN 45*  CREATININE 1.76*  CALCIUM  7.7*   GFR: Estimated Creatinine Clearance: 36.9 mL/min (A) (by C-G formula based on SCr of 1.76 mg/dL (H)). Liver Function Tests: Recent Labs  Lab 03/19/24 1135  AST 12*  ALT 13  ALKPHOS 94  BILITOT 1.2  PROT 5.1*  ALBUMIN 2.3*   No results for input(s): LIPASE, AMYLASE in the last 168 hours. No results for input(s): AMMONIA in the last 168 hours. Recent Labs    08/16/23 1348 03/06/24 2250 03/08/24 0651 03/09/24 0559 03/10/24 0752 03/19/24 1135  BUN 43* 47* 44* 45* 42* 45*  CREATININE 1.45* 2.50* 2.21* 2.18* 1.81* 1.76*    Cardiac Enzymes: No results for input(s): CKTOTAL, CKMB, CKMBINDEX, TROPONINI in the last 168 hours. BNP (last 3 results) No results for input(s): PROBNP in the last 8760 hours. HbA1C: No results for input(s): HGBA1C in the last 72 hours. CBG: No results for input(s): GLUCAP in the last 168 hours. Lipid Profile: No results for input(s): CHOL, HDL, LDLCALC, TRIG, CHOLHDL, LDLDIRECT in the last 72 hours. Thyroid  Function Tests: No results for input(s): TSH, T4TOTAL, FREET4, T3FREE, THYROIDAB in the last 72 hours. Anemia Panel: No results for input(s): VITAMINB12, FOLATE, FERRITIN, TIBC, IRON, RETICCTPCT in the last 72 hours. Urine analysis:    Component Value Date/Time   COLORURINE YELLOW 03/07/2024 0024   APPEARANCEUR HAZY (A) 03/07/2024 0024   LABSPEC 1.010 03/07/2024 0024   PHURINE 5.0  03/07/2024 0024   GLUCOSEU >=500 (A) 03/07/2024 0024   HGBUR NEGATIVE 03/07/2024 0024   BILIRUBINUR NEGATIVE 03/07/2024 0024   BILIRUBINUR negative 06/20/2015 1656   BILIRUBINUR small 10/20/2012 1350   KETONESUR NEGATIVE 03/07/2024 0024   PROTEINUR NEGATIVE 03/07/2024 0024   UROBILINOGEN 0.2 06/20/2015 1656   NITRITE NEGATIVE 03/07/2024 0024   LEUKOCYTESUR NEGATIVE 03/07/2024 0024   Radiological Exams on Admission: CT Head Wo Contrast Result Date: 03/19/2024 EXAM: CT HEAD WITHOUT CONTRAST 03/19/2024 05:43:14 PM TECHNIQUE: CT of the head was performed without the administration of intravenous contrast. Automated exposure control, iterative reconstruction, and/or weight based adjustment of the mA/kV was utilized to reduce the radiation dose to as low as reasonably achievable. COMPARISON: None available. CLINICAL HISTORY: Headache, increasing frequency or severity. FINDINGS: BRAIN AND VENTRICLES: A mixed density extra-axial hemorrhage over the right convexity measures up to 12 mm. A less extensive extraaxial collection over the left convexity measures 7 mm. The effacement of the sulci is present bilaterally. No significant midline shift is present. Periventricular and subcortical white matter hypoattenuation is moderately advanced for age. This most likely reflects the sequelae of chronic microvascular ischemia. Atherosclerotic calcifications are present in the cavernous carotid arteries bilaterally and at the dural margin of both vertebral arteries. No  hyperdense vessel is present. No evidence of acute infarct. No hydrocephalus. No mass effect. ORBITS: No acute abnormality. SINUSES: Chronic right maxillary sinus disease is present with wall thickening. A fluid level is present. Mild mucosal thickening is present in the sphenoid sinuses bilaterally. SOFT TISSUES AND SKULL: No acute soft tissue abnormality. No skull fracture. IMPRESSION: 1. Mixed-density bilateral convexity extra-axial hemorrhages (right  up to 12 mm, left up to 7 mm) with bilateral sulcal effacement and no significant midline shift. The collections are consistent with bilateral acute/subacute subdural hematomas. 2. Moderately advanced chronic microvascular ischemic changes. 3. Intracranial atherosclerosis. Critical value findings were called to Dr. Lenor at 5:57 pm. Electronically signed by: Lonni Necessary MD 03/19/2024 06:01 PM EDT RP Workstation: [id number]   CT Angio Chest PE W and/or Wo Contrast Result Date: 03/19/2024 CLINICAL DATA:  Shortness of breath for 2 weeks. EXAM: CT ANGIOGRAPHY CHEST WITH CONTRAST TECHNIQUE: Multidetector CT imaging of the chest was performed using the standard protocol during bolus administration of intravenous contrast. Multiplanar CT image reconstructions and MIPs were obtained to evaluate the vascular anatomy. RADIATION DOSE REDUCTION: This exam was performed according to the departmental dose-optimization program which includes automated exposure control, adjustment of the mA and/or kV according to patient size and/or use of iterative reconstruction technique. CONTRAST:  75mL OMNIPAQUE  IOHEXOL  350 MG/ML SOLN COMPARISON:  June 02, 2014. FINDINGS: Cardiovascular: Satisfactory opacification of the pulmonary arteries to the segmental level. No evidence of pulmonary embolism. Normal heart size. No pericardial effusion. Coronary artery calcifications are noted. 4 cm ascending thoracic aortic aneurysm is noted. Aortic atherosclerosis is noted. Mediastinum/Nodes: No enlarged mediastinal, hilar, or axillary lymph nodes. Thyroid  gland, trachea, and esophagus demonstrate no significant findings. Lungs/Pleura: Minimal bilateral pleural effusions are noted. No pneumothorax is noted. Minimal bibasilar subsegmental atelectasis is noted. Upper Abdomen: No acute abnormality. Musculoskeletal: No chest wall abnormality. No acute or significant osseous findings. Review of the MIP images confirms the above findings.  IMPRESSION: 1. No definite evidence of pulmonary embolus. 2. Coronary artery calcifications are noted suggesting coronary artery disease. 3. Minimal bilateral pleural effusions are noted with minimal bibasilar subsegmental atelectasis. 4. 4 cm ascending thoracic aortic aneurysm. Recommend annual imaging followup by CTA or MRA. This recommendation follows 2010 ACCF/AHA/AATS/ACR/ASA/SCA/SCAI/SIR/STS/SVM Guidelines for the Diagnosis and Management of Patients with Thoracic Aortic Disease. Circulation. 2010; 121: Z733-z630. Aortic aneurysm NOS (ICD10-I71.9). Aortic Atherosclerosis (ICD10-I70.0). Electronically Signed   By: Lynwood Landy Raddle M.D.   On: 03/19/2024 16:03   DG Chest Port 1 View Result Date: 03/19/2024 CLINICAL DATA:  Shortness of breath and weakness. EXAM: PORTABLE CHEST 1 VIEW COMPARISON:  03/06/2024 FINDINGS: Right IJ Port-A-Cath with tip over the SVC. Lungs are adequately inflated and otherwise clear. Cardiomediastinal silhouette and remainder of the exam is unchanged. IMPRESSION: No acute cardiopulmonary disease. Electronically Signed   By: Toribio Agreste M.D.   On: 03/19/2024 11:53   Data Reviewed: Relevant notes from primary care and specialist visits, past discharge summaries as available in EHR, including Care Everywhere . Prior diagnostic testing as pertinent to current admission diagnoses, Updated medications and problem lists for reconciliation .ED course, including vitals, labs, imaging, treatment and response to treatment,Triage notes, nursing and pharmacy notes and ED provider's notes.Notable results as noted in HPI.Discussed case with EDMD/ ED APP/ or Specialty MD on call and as needed.  Assessment & Plan   >>SOB: Patient is presenting with shortness of breath, O2 sats remained 100% on room air.  CTA chest negative for pulmonary embolism.  Coronary artery  disease calcifications are noted, minimal bilateral pleural effusions are noted with bibasilar subsegmental atelectasis, incidental  4 cm ascending thoracic aortic aneurysm noted with recommendations for annual imaging. Continuous pulse oximetry. Venous blood gas.  >>BL SDH: NS Consulted.  D/W EDMD about reaching out to icu.  CT scan noncontrast shows Mixed-density bilateral convexity extra-axial hemorrhages (right up to 12 mm, left up to 7 mm) with bilateral sulcal effacement and no significant, midline shift. The collections are consistent with bilateral acute/subacute subdural hematomas.   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  Platelet transfusion x 4 units . PRBC one unit.   >>Hyponatremia: Mild will follow suspect 2/2 to hyperglycemia.    >>CKD stage 3a: Lab Results  Component Value Date   CREATININE 1.76 (H) 03/19/2024   CREATININE 1.81 (H) 03/10/2024   CREATININE 2.18 (H) 03/09/2024     >>CAD: Stable , negative troponin.     >>C/H systolic CHF: Echo 12/2023 with LVEF 45-50%.  Bradycardia has resolved. Keep off AV nodal agents. Continue mexitil     DVT prophylaxis:  Scd's  Consults:  NS/PCCM.  Advance Care Planning:    Code Status: Prior   Family Communication:  N/A Disposition Plan:  N/A Severity of Illness: The appropriate patient status for this patient is INPATIENT. Inpatient status is judged to be reasonable and necessary in order to provide the required intensity of service to ensure the patient's safety. The patient's presenting symptoms, physical exam findings, and initial radiographic and laboratory data in the context of their chronic comorbidities is felt to place them at high risk for further clinical deterioration. Furthermore, it is not anticipated that the patient will be medically stable for discharge from the hospital within 2 midnights of admission.   * I certify that at the point of admission it is my clinical judgment that the patient will require  inpatient hospital care spanning beyond 2 midnights from the point of admission due to high intensity of service, high risk for further deterioration and high frequency of surveillance required.*  Unresulted Labs (From admission, onward)     Start     Ordered   03/19/24 1737  Magnesium   Add-on,   AD        03/19/24 1759   03/19/24 1626  APTT  Add-on,   AD        03/19/24 1625            Meds ordered this encounter  Medications   0.9 %  sodium chloride  infusion (Manually program via Guardrails IV Fluids)   iohexol  (OMNIPAQUE ) 350 MG/ML injection 75 mL   0.9 %  sodium chloride  infusion (Manually program via Guardrails IV Fluids)   calcium  gluconate 1 g/ 50 mL sodium chloride  IVPB   cefUROXime  (CEFTIN ) tablet 500 mg   hydroxyurea  (HYDREA ) capsule 1,000 mg   finasteride  (PROSCAR ) tablet 5 mg   valACYclovir  (VALTREX ) tablet 500 mg   voriconazole  (VFEND ) tablet 200 mg   tamsulosin  (FLOMAX ) capsule 0.8 mg   rosuvastatin  (CRESTOR ) tablet 20 mg   mexiletine (MEXITIL ) capsule 200 mg     Orders Placed This Encounter  Procedures   DG Chest Port 1 View   CT Angio Chest PE W and/or Wo Contrast   CT Head Wo Contrast   CBC with Differential/Platelet   Brain natriuretic peptide   Comprehensive metabolic panel with GFR   Protime-INR   Pathologist smear review   D-dimer,  quantitative   APTT   Magnesium    Informed Consent Details: Physician/Practitioner Attestation; Transcribe to consent form and obtain patient signature   Consult to hospitalist   Consult to neurosurgery   Consult to intensivist   I-Stat CG4 Lactic Acid   I-Stat venous blood gas, ([facility] ED, [facility], [facility])   EKG 12-Lead   EKG 12-Lead   Type and screen [facility]   Prepare RBC (crossmatch)   Prepare platelet pheresis   Prepare platelet pheresis    Author: Mario LULLA Blanch, MD 12 pm- 8 pm. [facility]. 03/19/2024 6:23 PM Please note for any communication after hours contact [facility] Assigned provider on  call on Amion.  Physical Exam: Pt not seen. Chart reviewed.

## 2024-03-20 ENCOUNTER — Inpatient Hospital Stay (HOSPITAL_COMMUNITY)

## 2024-03-20 DIAGNOSIS — D649 Anemia, unspecified: Secondary | ICD-10-CM | POA: Diagnosis not present

## 2024-03-20 DIAGNOSIS — D696 Thrombocytopenia, unspecified: Secondary | ICD-10-CM | POA: Diagnosis not present

## 2024-03-20 DIAGNOSIS — C9290 Myeloid leukemia, unspecified, not having achieved remission: Secondary | ICD-10-CM | POA: Diagnosis not present

## 2024-03-20 DIAGNOSIS — S065XAA Traumatic subdural hemorrhage with loss of consciousness status unknown, initial encounter: Secondary | ICD-10-CM | POA: Diagnosis not present

## 2024-03-20 LAB — TYPE AND SCREEN
ABO/RH(D): A POS
Antibody Screen: NEGATIVE
Unit division: 0
Unit division: 0

## 2024-03-20 LAB — PREPARE PLATELET PHERESIS
Unit division: 0
Unit division: 0
Unit division: 0
Unit division: 0

## 2024-03-20 LAB — BPAM RBC
Blood Product Expiration Date: 202510182359
Blood Product Expiration Date: 202510182359
ISSUE DATE / TIME: 202510031519
ISSUE DATE / TIME: 202510032026
Unit Type and Rh: 6200
Unit Type and Rh: 6200

## 2024-03-20 LAB — GLUCOSE, CAPILLARY
Glucose-Capillary: 61 mg/dL — ABNORMAL LOW (ref 70–99)
Glucose-Capillary: 106 mg/dL — ABNORMAL HIGH (ref 70–99)
Glucose-Capillary: 72 mg/dL (ref 70–99)
Glucose-Capillary: 137 mg/dL — ABNORMAL HIGH (ref 70–99)
Glucose-Capillary: 195 mg/dL — ABNORMAL HIGH (ref 70–99)
Glucose-Capillary: 189 mg/dL — ABNORMAL HIGH (ref 70–99)

## 2024-03-20 LAB — BPAM PLATELET PHERESIS
Blood Product Expiration Date: 202510062359
Blood Product Expiration Date: 202510062359
Blood Product Expiration Date: 202510072359
Blood Product Expiration Date: 202510072359
ISSUE DATE / TIME: 202510031341
ISSUE DATE / TIME: 202510031709
ISSUE DATE / TIME: 202510032026
ISSUE DATE / TIME: 202510032356
Unit Type and Rh: 6200
Unit Type and Rh: 5100
Unit Type and Rh: 6200
Unit Type and Rh: 6200

## 2024-03-20 LAB — CBC
HCT: 26.2 % — ABNORMAL LOW (ref 39.0–52.0)
Hemoglobin: 9.1 g/dL — ABNORMAL LOW (ref 13.0–17.0)
MCH: 29.1 pg (ref 26.0–34.0)
MCHC: 34.7 g/dL (ref 30.0–36.0)
MCV: 83.7 fL (ref 80.0–100.0)
Platelets: 59 K/uL — ABNORMAL LOW (ref 150–400)
RBC: 3.13 MIL/uL — ABNORMAL LOW (ref 4.22–5.81)
RDW: 15.7 % — ABNORMAL HIGH (ref 11.5–15.5)
WBC: 7.8 K/uL (ref 4.0–10.5)
nRBC: 0 % (ref 0.0–0.2)

## 2024-03-20 MED ORDER — DEXTROSE 10 % IV SOLN: INTRAVENOUS | Status: DC

## 2024-03-20 MED ORDER — METOPROLOL SUCCINATE ER 25 MG PO TB24
25.0000 mg | ORAL_TABLET | Freq: Two times a day (BID) | ORAL | Status: DC
  Administered 2024-03-20 – 2024-03-21 (×3): 25 mg via ORAL
  Filled 2024-03-20 (×3): qty 1

## 2024-03-20 MED ORDER — DEXTROSE 50 % IV SOLN
12.5000 g | INTRAVENOUS | Status: AC
  Administered 2024-03-20: 12.5 g via INTRAVENOUS

## 2024-03-20 MED ORDER — TERBINAFINE HCL 250 MG PO TABS
250.0000 mg | ORAL_TABLET | Freq: Every day | ORAL | Status: DC
Start: 2024-03-20 — End: 2024-03-29
  Administered 2024-03-20 – 2024-03-28 (×9): 250 mg via ORAL
  Filled 2024-03-20 (×9): qty 1

## 2024-03-20 MED ORDER — LORAZEPAM 1 MG PO TABS
1.0000 mg | ORAL_TABLET | Freq: Every day | ORAL | Status: DC
  Administered 2024-03-20 – 2024-03-27 (×8): 1 mg via ORAL
  Filled 2024-03-20 (×8): qty 1

## 2024-03-20 NOTE — Plan of Care (Signed)

## 2024-03-20 NOTE — Evaluation (Signed)
 Occupational Therapy Evaluation Patient Details Name: Paul JACQUIN, Paul [name] MRN: [id number] DOB: 04-18-1946 Today's Date: 03/20/2024   History of Present Illness   Pt is a 78 y.o. male presenting 10/3 with progressive generalized weakness and shortness of breath over the past 2 weeks. Found to have acute on subacute bilateral subdural hematomas in the setting of his known severe thrombocytopenia. Admitted for neuro eval. PMH: DM, OSA, chronic systolic CHF with AICD placement, hypogonadism, HTN, stage 3 CKD, TKR bil, TIA, acute myeloid leukemia undergoing treatment at [facility], orthostatic hypotension, CAD with PCI     Clinical Impressions PTA, pt lived with wife and reports retiring ~ 2 weeks ago; since retiring, pt progressively more SOB due to anemia and with 3-4 recent falls, needing assist with BADL and mobility most recently. Upon eval, pt needing up to min A for UB Adl and mod-max A for LB ADL. Pt with generalized weakness, decreased activity tolerance, safety, balance, attention, and executive function. Pt currently requiring min A for bed mobility and STS transfer. Noted to be orthostatic on standing needing up to mod A to maintain balance, slurred speech, eyes closed, and incontinence while attempting to take a standing BP, however, no LOC noted. See orthostatics below. Given BP management, believe pt will progress well; thus, recommend HHOT at dc.   Supine 117/80 (91) HR 116 Seated: 112/68 (73) HR 135  Standing: 73/55 HR 165 with HR max 179 Return to supine: 92/61 HR 130      If plan is discharge home, recommend the following:   A lot of help with walking and/or transfers;Two people to help with walking and/or transfers;A lot of help with bathing/dressing/bathroom;Assistance with cooking/housework;Assist for transportation;Help with stairs or ramp for entrance     Functional Status Assessment   Patient has had a recent decline in their functional status and demonstrates the ability  to make significant improvements in function in a reasonable and predictable amount of time.     Equipment Recommendations   BSC/3in1     Recommendations for Other Services         Precautions/Restrictions   Precautions Precautions: Fall;Other (comment) (multiple bruises/leukemia) Recall of Precautions/Restrictions: Intact Precaution/Restrictions Comments: orthostatic, tachycardic up to 179 Restrictions Weight Bearing Restrictions Per Provider Order: No     Mobility Bed Mobility Overal bed mobility: Needs Assistance Bed Mobility: Supine to Sit, Sit to Supine     Supine to sit: Min assist Sit to supine: Mod assist, +2 for physical assistance, +2 for safety/equipment   General bed mobility comments: Min A for light guidance during trunk elevation to come to EOB; mod A +2 with asisst for trunk and BLE into bed after standing, orthostatic, becoming soiled.    Transfers Overall transfer level: Needs assistance Equipment used: Rolling walker (2 wheels) Transfers: Sit to/from Stand Sit to Stand: Min assist, +2 safety/equipment           General transfer comment: min A for rise up to standing. with prolonged standing R posterior/lateral lean needing up to mod A to maintain upright      Balance Overall balance assessment: Needs assistance Sitting-balance support: No upper extremity supported, Feet supported Sitting balance-Leahy Scale: Fair     Standing balance support: Bilateral upper extremity supported Standing balance-Leahy Scale: Poor Standing balance comment: limited by becoming orthostatic                           ADL either performed or assessed with clinical  judgement   ADL Overall ADL's : Needs assistance/impaired Eating/Feeding: Set up;Bed level   Grooming: Contact guard assist;Sitting;Wash/dry face   Upper Body Bathing: Contact guard assist;Sitting   Lower Body Bathing: Moderate assistance;Sit to/from stand   Upper Body Dressing  : Contact guard assist;Sitting;Minimal assistance   Lower Body Dressing: Total assistance Lower Body Dressing Details (indicate cue type and reason): for socks Toilet Transfer: Minimal assistance Toilet Transfer Details (indicate cue type and reason): STS with RW                 Vision Baseline Vision/History: 0 No visual deficits Ability to See in Adequate Light: 0 Adequate Patient Visual Report: No change from baseline Vision Assessment?: No apparent visual deficits Additional Comments: not formally assessed, pt able to read OT name badge and S&S of CVA. Did need one cue to begin at top of handout     Perception         Praxis         Pertinent Vitals/Pain Pain Assessment Pain Assessment: Faces Faces Pain Scale: Hurts a little bit Pain Location: generalized Pain Descriptors / Indicators: Aching Pain Intervention(s): Limited activity within patient's tolerance, Monitored during session     Extremity/Trunk Assessment Upper Extremity Assessment Upper Extremity Assessment: Generalized weakness   Lower Extremity Assessment Lower Extremity Assessment: Defer to PT evaluation   Cervical / Trunk Assessment Cervical / Trunk Assessment: Normal   Communication Communication Communication: Other (comment) Factors Affecting Communication:  (increasd time for word finding at times. slurred speech with standing and pt noted to be orthostatics)   Cognition Arousal: Alert Behavior During Therapy: WFL for tasks assessed/performed Cognition: Cognition impaired   Orientation impairments: Place (initially believed he was in [place]) Awareness: Intellectual awareness impaired, Online awareness impaired (BM with no awareness in standing; although BP did drop significantly) Memory impairment (select all impairments): Short-term memory, Declarative long-term memory, Working memory Attention impairment (select first level of impairment): Sustained attention, Focused  attention Executive functioning impairment (select all impairments): Organization, Sequencing, Problem solving OT - Cognition Comments: needing cues for safety with mobility and sustaining attention to task intermittently. pt reports difficulty using phone due to attention > visual deficits, although he does also endorse glaucoma                 Following commands: Impaired Following commands impaired: Follows one step commands with increased time     Cueing  General Comments   Cueing Techniques: Verbal cues      Exercises     Shoulder Instructions      Home Living Family/patient expects to be discharged to:: Private residence Living Arrangements: Spouse/significant other Available Help at Discharge: Family;Available 24 hours/day Type of Home: House Home Access: Level entry     Home Layout: Two level;Able to live on main level with bedroom/bathroom     Bathroom Shower/Tub: Producer, television/film/video: Standard     Home Equipment: Agricultural consultant (2 wheels);Shower seat;Grab bars - tub/shower          Prior Functioning/Environment Prior Level of Function : Independent/Modified Independent;Needs assist             Mobility Comments: for the past two weeks has been using a walker when he is able to get OOB ADLs Comments: retired 2 weeks ago; since then, has had assist from wife with BADL; has only been able to bathe once due to SOB. prior to 2 weeks ago, independent and still working as a Development worker, community  OT Problem List: Decreased activity tolerance;Impaired balance (sitting and/or standing);Increased edema;Pain;Cardiopulmonary status limiting activity;Decreased strength;Decreased cognition;Decreased knowledge of use of DME or AE   OT Treatment/Interventions: Self-care/ADL training;Energy conservation;Therapeutic activities;Balance training      OT Goals(Current goals can be found in the care plan section)   Acute Rehab OT Goals Patient Stated Goal:  get better OT Goal Formulation: With patient Time For Goal Achievement: 04/03/24 Potential to Achieve Goals: Good   OT Frequency:  Min 2X/week    Co-evaluation PT/OT/SLP Co-Evaluation/Treatment: Yes Reason for Co-Treatment: For patient/therapist safety;To address functional/ADL transfers PT goals addressed during session: Mobility/safety with mobility;Proper use of DME OT goals addressed during session: Proper use of Adaptive equipment and DME;ADL's and self-care      AM-PAC OT 6 Clicks Daily Activity     Outcome Measure Help from another person eating meals?: A Little Help from another person taking care of personal grooming?: A Little Help from another person toileting, which includes using toliet, bedpan, or urinal?: A Lot Help from another person bathing (including washing, rinsing, drying)?: A Lot Help from another person to put on and taking off regular upper body clothing?: A Little Help from another person to put on and taking off regular lower body clothing?: A Lot 6 Click Score: 15   End of Session Equipment Utilized During Treatment: Rolling walker (2 wheels);Gait belt Nurse Communication: Mobility status  Activity Tolerance: Treatment limited secondary to medical complications (Comment) (orthostatics) Patient left: in bed;with call bell/phone within reach;with bed alarm set  OT Visit Diagnosis: Unsteadiness on feet (R26.81);Muscle weakness (generalized) (M62.81);Other symptoms and signs involving cognitive function                Time: 8961-8884 OT Time Calculation (min): 37 min Charges:  OT General Charges $OT Visit: 1 Visit OT Evaluation $OT Eval Moderate Complexity: 1 Mod  Elma JONETTA Lebron FREDERICK, OTR/L [facility] Acute Rehabilitation Office: [phone]   Elma JONETTA Lebron 03/20/2024, 1:07 PM

## 2024-03-20 NOTE — Progress Notes (Signed)
 NAME:  Paul GORMAN Piety, MD, MRN:  [id number], DOB:  February 21, 1946, LOS: 1 ADMISSION DATE:  03/19/2024, CONSULTATION DATE:  03/19/24 REFERRING MD:  Lenor, CHIEF COMPLAINT:  SDH   History of Present Illness:  78 yo M PMH AML undergoing tx at [facility], SDH HTN, HLD who presented to [facility] ED 10/3 w CC SOB, CT angiogram of chest was done, PE was ruled out, also patient complained of generalized weakness and increasing headache.  Recently he was diagnosed with subacute subdural hemorrhage with increasing headache, CT head was done which showed acute on subacute bilateral subdural hematoma.  Also patient was noted to have platelet count of 5, he was started on platelet infusion, PCCM was consulted for help evaluation medical management.  Of note neurosurgery was called, they recommend no surgical intervention as of now, repeat head CT in 8 hours Patient denies fever, cough, chills, nausea and vomiting  Pertinent  Medical History   Past Medical History:  Diagnosis Date   Aftercare following removal/replacement defibrillator    a. defibrillator removed 03/2019.   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 03/06/2015   NICM (nonischemic cardiomyopathy) (HCC)    Sleep apnea    cpap   Type II diabetes mellitus (HCC)    Ventricular tachycardia (HCC)    observed during stress testing, subsequent VT on EPS   Wears glasses      Significant Hospital  Events: Including procedures, antibiotic start and stop dates in addition to other pertinent events   Admitted with acute on subacute subdural hemorrhage bilaterally  Interim History / Subjective:  Patient is complaining of headache, denies weakness, nausea or vomiting Stated breathing is better  Objective    Blood pressure 118/68, pulse 99, temperature 98.3 F (36.8 C), temperature source Axillary, resp. rate 14, height 5' 10 (1.778 m), weight 79.2 kg, SpO2 98%.        Intake/Output Summary (Last 24 hours) at 03/20/2024 9092 Last data filed at 03/20/2024 0600 Gross per 24 hour  Intake 727.62 ml  Output 1220 ml  Net -492.38 ml   Filed Weights   03/19/24 1118 03/20/24 0424  Weight: 80.4 kg 79.2 kg    Examination: General: Chronically ill-appearing male, lying on the bed HEENT: /AT, eyes anicteric.  moist mucus membranes Neuro: Alert, awake following commands Chest: Coarse breath sounds, no wheezes or rhonchi.  Port-A-Cath is present Heart: Regular rate and rhythm, no murmurs or gallops Abdomen: Soft, nontender, nondistended, bowel sounds present  Labs and images reviewed   Resolved problem list   Assessment and Plan  Acute on subacute bilateral subdural hemorrhage likely in the setting of severe thrombocytopenia Anemia and severe thrombocytopenia in the setting of chemotherapy status post platelet transfusion Acute myeloid leukemia on chemotherapy Chronic HFrEF Diabetes type 2 CKD 3b  Continue neuro watch Repeat head CT overnight showing stable acute on  subacute bilateral subdural hemorrhage Patient was seen by neurosurgery, recommend watchful waiting, no surgical intervention needed Continue Keppra 500 mg twice daily for seizure prophylaxis Patient received platelet transfusion, repeat platelet count is 59 Closely monitor hemoglobin and platelet count.  Transfuse blood if hemoglobin drops less than 8 Outpatient follow-up with hematology at [facility] GDMT as  tolerated His A1c is 7.3 Continue sliding scale insulin  with CBG goal 140-180 D10 was  stopped Serum creatinine is at baseline Continue as needed  oxycodone  for headache   Labs   CBC: Recent Labs  Lab 03/19/24 1135 03/20/24 0427  WBC 8.0 7.8  NEUTROABS 2.6  --   HGB 8.0* 9.1*  HCT 24.4* 26.2*  MCV 87.5 83.7  PLT <5* 59*    Basic Metabolic Panel: Recent Labs  Lab 03/19/24 1135 03/19/24 2017  NA 130*  --   K 3.5  --   CL 96*  --   CO2 20*  --   GLUCOSE 297*  --   BUN 45*  --   CREATININE 1.76*  --   CALCIUM  7.7*  --   MG  --  1.7   GFR: Estimated Creatinine Clearance: 36.9 mL/min (A) (by C-G formula based on SCr of 1.76 mg/dL (H)). Recent Labs  Lab 03/19/24 1135 03/20/24 0427  WBC 8.0 7.8    Liver Function Tests: Recent Labs  Lab 03/19/24 1135  AST 12*  ALT 13  ALKPHOS 94  BILITOT 1.2  PROT 5.1*  ALBUMIN 2.3*   No results for input(s): LIPASE, AMYLASE in the last 168 hours. No results for input(s): AMMONIA in the last 168 hours.  ABG    Component Value Date/Time   TCO2 27 07/29/2019 1445     Coagulation Profile: Recent Labs  Lab 03/19/24 1135  INR 1.3*    Cardiac Enzymes: No results for input(s): CKTOTAL, CKMB, CKMBINDEX, TROPONINI in the last 168 hours.  HbA1C: Hemoglobin A1C  Date/Time Value Ref Range Status  06/01/2021 09:26 AM 6.9 (A) 4.0 - 5.6 % Final  09/12/2020 09:27 AM 6.4 (A) 4.0 - 5.6 % Final  07/23/2018 12:00 AM 7.3  Final   Hgb A1c MFr Bld  Date/Time Value Ref Range Status  03/06/2024 11:52 PM 7.3 (H) 4.8 - 5.6 % Final    Comment:    (NOTE)         Prediabetes: 5.7 - 6.4         Diabetes: >6.4         Glycemic control for adults with diabetes: <7.0   05/07/2019 01:07 PM 7.0 (H) 4.8 - 5.6 % Final    Comment:    (NOTE) Pre diabetes:          5.7%-6.4% Diabetes:              >6.4% Glycemic control for   <7.0% adults with diabetes     CBG: Recent Labs  Lab 03/19/24 2317 03/19/24 2342  03/20/24 0322 03/20/24 0343 03/20/24 0742  GLUCAP 56* 78 61* 106* 72     Valinda Novas, MD [facility] See Amion for pager If no response to pager, please call [phone] until 7pm After 7pm, Please call E-link [phone]

## 2024-03-20 NOTE — Evaluation (Signed)
 Physical Therapy Evaluation Patient Details Name: Paul PICOU, MD MRN: [id number] DOB: 01/27/1946 Today's Date: 03/20/2024  History of Present Illness  Pt is a 78 y.o. male presenting 10/3 with progressive generalized weakness and shortness of breath over the past 2 weeks. Found to have acute on subacute bilateral subdural hematomas in the setting of his known severe thrombocytopenia. Admitted for neuro eval. PMH: DM, OSA, chronic systolic CHF with AICD placement, hypogonadism, HTN, stage 3 CKD, TKR bil, TIA, acute myeloid leukemia undergoing treatment at [facility], orthostatic hypotension, CAD with PCI   Clinical Impression  Pt presents with condition above and deficits mentioned below, see PT Problem List. PTA, he was independent without DME up until ~2 weeks ago, then has been needing a RW when able to get OOB. Pt lives with his wife in a 2-level house with a level entry. He does not have to go upstairs. Today's session was limited by tachycardia and orthostatic BP with pt symptomatic, see below. He required minA to transition supine to sit and to transfer sit to stand. As he became symptomatic when standing, he began to lean posteriorly and to the R, needing modA to maintain balance. He was sat back down then returned to supine to manage his symptoms and improve his vitals for his safety. Was unable to progress or assess OOB past standing at EOB due to this this date. Hopefully the pt will progress quickly as anticipated when his BP and HR are better managed, thus currently recommending follow-up with HHPT. However, if this is not the case, then he may benefit from more intensive inpatient rehab, > 3 hours/day, prior to d/c home. Will continue to follow acutely.   HR up to 179 bpm max this session, 110s at rest BP--  117/80 (91) supine 112/65 (73) sitting 73/55 standing 92/61 supine end of session         If plan is discharge home, recommend the following: A little help with  bathing/dressing/bathroom;Help with stairs or ramp for entrance;Assist for transportation;A lot of help with walking and/or transfers;Assistance with cooking/housework   Can travel by private vehicle        Equipment Recommendations BSC/3in1;Wheelchair (measurements PT);Wheelchair cushion (measurements PT)  Recommendations for Other Services  Rehab consult    Functional Status Assessment Patient has had a recent decline in their functional status and demonstrates the ability to make significant improvements in function in a reasonable and predictable amount of time.     Precautions / Restrictions Precautions Precautions: Fall;Other (comment) (multiple bruises/leukemia) Recall of Precautions/Restrictions: Intact Precaution/Restrictions Comments: orthostatic, tachycardic up to 179 Restrictions Weight Bearing Restrictions Per Provider Order: No      Mobility  Bed Mobility                    Transfers                   General transfer comment: min A for rise up to standing. with prolonged standing R posterior/lateral lean needing up to mod A to maintain upright. For safety of pt, pt sat EOB and returned to supine due to drop in BP and rise of HR    Ambulation/Gait               General Gait Details: unable to safely attempt gait due to pt symptomatically orthostatic with HR up to 179  Stairs            Wheelchair Mobility     Tilt Bed  Modified Rankin (Stroke Patients Only) Modified Rankin (Stroke Patients Only) Pre-Morbid Rankin Score: No symptoms Modified Rankin: Severe disability     Balance               Standing balance comment: limited by becoming orthostatic, leaning R and posteriorly and needing RW and modA for balance then                             Pertinent Vitals/Pain Pain Assessment Pain Assessment: Faces Faces Pain Scale: Hurts a little bit Pain Location: generalized Pain Descriptors / Indicators:  Aching Pain Intervention(s): Limited activity within patient's tolerance, Monitored during session, Repositioned    Home Living Family/patient expects to be discharged to:: Private residence Living Arrangements: Spouse/significant other Available Help at Discharge: Family;Available 24 hours/day Type of Home: House Home Access: Level entry       Home Layout: Two level;Able to live on main level with bedroom/bathroom Home Equipment: Rolling Walker (2 wheels);Shower seat;Grab bars - tub/shower      Prior Function Prior Level of Function : Independent/Modified Independent;Needs assist             Mobility Comments: for the past two weeks has been using a walker when he is able to get OOB ADLs Comments: retired 2 weeks ago; since then, has had assist from wife with BADL; has only been able to bathe once due to SOB. 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 slurred speech with standing and pt noted to be orthostatic)    Cognition Arousal: Alert Behavior During Therapy: WFL for tasks assessed/performed   PT - Cognitive impairments: Awareness                       PT - Cognition Comments: as pt became orthostatic he became more slurred and altered, but improved once supine again. Seemed unaware of extent of his symptoms and his bowel incontinence when  orthostatic Following commands: Impaired Following commands impaired: Follows one step commands with increased time     Cueing Cueing Techniques: Verbal cues     General Comments General comments (skin integrity, edema, etc.): limited by tachycardia and orthostatic BP; HR up to 179 bpm max this session, 110s at rest; BP 117/80 (91) supine, 112/65 (73) sitting, 73/55 standing, 92/61 supine end of session    Exercises     Assessment/Plan    PT Assessment Patient needs continued PT services  PT Problem List Decreased strength;Decreased activity tolerance;Decreased balance;Decreased mobility;Cardiopulmonary status limiting activity       PT Treatment Interventions DME instruction;Gait training;Functional mobility training;Therapeutic activities;Therapeutic exercise;Balance training;Patient/family education;Stair training;Cognitive remediation;Neuromuscular re-education    PT Goals (Current goals can be found in the Care Plan section)  Acute Rehab PT Goals Patient Stated Goal: to improve PT Goal Formulation: With patient Time For Goal Achievement: 04/03/24 Potential to Achieve Goals: Good    Frequency Min 3X/week  Co-evaluation   Reason for Co-Treatment: For patient/therapist safety;To address functional/ADL transfers PT goals addressed during session: Mobility/safety with mobility;Proper use of DME OT goals addressed during session: Proper use of Adaptive equipment and DME;ADL's and self-care       AM-PAC PT 6 Clicks Mobility  Outcome Measure Help needed turning from your back to your side while in a flat bed without using bedrails?: A Little Help needed moving from lying on your back to sitting on the side of a flat bed without using bedrails?: A Little Help needed moving to and from a bed to a chair (including a wheelchair)?: A Little Help needed standing up from a chair using your arms (e.g., wheelchair or bedside chair)?: A Lot Help needed to walk in hospital  room?: Total Help needed climbing 3-5 steps with a railing? : Total 6 Click Score: 13    End of Session Equipment Utilized During Treatment: Gait belt Activity Tolerance: Treatment limited secondary to medical complications (Comment) (limited by BP drop and HR rise) Patient left: in bed;with call bell/phone within reach;with bed alarm set Nurse Communication: Mobility status;Other (comment) (vitals and bowel incontinence) PT Visit Diagnosis: Unsteadiness on feet (R26.81);Other abnormalities of gait and mobility (R26.89);Muscle weakness (generalized) (M62.81);Difficulty in walking, not elsewhere classified (R26.2)    Time: 8961-8884 PT Time Calculation (min) (ACUTE ONLY): 37 min   Charges:   PT Evaluation $PT Eval Moderate Complexity: 1 Mod   PT General Charges $$ ACUTE PT VISIT: 1 Visit         Theo Ferretti, PT, DPT Acute Rehabilitation Services  Office: [phone]   Theo CHRISTELLA Ferretti 03/20/2024, 1:54 PM

## 2024-03-20 NOTE — Consult Note (Signed)
 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   HPI:  78 year old male presented to the ED last night after he reported SOB and headaches. Has a very extensive medical history including thrombocytopenia. His last platelet count was 5, he was given platelets last night. Patient currently complains of headaches but no NV or dizziness.   Past Medical History:  Diagnosis Date   Aftercare following removal/replacement defibrillator    a. defibrillator removed 03/2019.   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 03/06/2015   NICM (nonischemic cardiomyopathy) (HCC)    Sleep apnea    cpap   Type II diabetes mellitus (HCC)    Ventricular tachycardia (HCC)    observed during stress testing, subsequent VT on EPS   Wears glasses     Past Surgical History:  Procedure Laterality Date   ABLATION  06-01-2013   PVC's ablated along basal inferoseptal RV by Dr Kelsie   APPENDECTOMY  1969   CARDIAC CATHETERIZATION  04/2010   CARDIAC DEFIBRILLATOR PLACEMENT  05/09/2010   MDT ICD implanted in [place] by Dr Junette   CATARACT EXTRACTION W/ INTRAOCULAR LENS  IMPLANT, BILATERAL Bilateral    ICD LEAD REMOVAL N/A 03/19/2019   Procedure: ICD LEAD AND GENERATOR REMOVAL;  Surgeon: Waddell Danelle ORN, MD;  Location: [facility] OR;  Service: Cardiovascular;  Laterality: N/A;   KNEE ARTHROSCOPY Left 1984   LUMBAR LAMINECTOMY  ~  2004   TONSILLECTOMY AND ADENOIDECTOMY  1972   TOTAL KNEE ARTHROPLASTY Left 04/28/2017   TOTAL KNEE ARTHROPLASTY Left 04/28/2017   Procedure: TOTAL KNEE ARTHROPLASTY LEFT;  Surgeon: Yvone Rush, MD;  Location: [facility] OR;  Service: Orthopedics;  Laterality: Left;   TOTAL KNEE ARTHROPLASTY Right 05/07/2019   Procedure: TOTAL KNEE ARTHROPLASTY;  Surgeon: Yvone Rush, MD;  Location: [facility] ORS;  Service: Orthopedics;  Laterality: Right;   V-TACH ABLATION N/A 06/01/2013   Procedure: V-TACH ABLATION;  Surgeon: Lynwood JONETTA Kelsie, MD;  Location: [facility] CATH LAB;  Service: Cardiovascular;  Laterality: N/A;   VASECTOMY     VENTRICULAR ABLATION SURGERY  06/01/2013    Allergies  Allergen Reactions   Lisinopril Other (See Comments), Shortness Of Breath, Swelling and Anaphylaxis    Angioedema  Angioedema    Angioedema    Social History   Tobacco Use   Smoking status: Never   Smokeless tobacco: Never  Substance Use Topics   Alcohol  use: Yes    Comment: social    Family History  Problem Relation Age of Onset   Alcohol  abuse Mother    COPD Mother    Depression Mother    Heart disease Mother    Hypertension Mother    Stroke Mother    COPD Father    Heart disease Father    Hypertension Father    Stroke Father  Cancer Father    Diabetes Sister    Mental retardation Sister    Learning disabilities Sister    Hypertension Sister    Alcohol  abuse Brother    Drug abuse Brother    Heart disease Brother    Hypertension Brother    Coronary artery disease Brother    Colon cancer Neg Hx    Pancreatic cancer Neg Hx    Esophageal cancer Neg Hx    Stomach cancer Neg Hx    Liver disease Neg Hx    Rectal cancer Neg Hx      Review of Systems  Positive ROS: as above   All other systems have been reviewed and were otherwise negative with the exception of those mentioned in the HPI and as above.  Objective: Vital signs in last 24 hours: Temp:  [97.7 F (36.5 C)-100.1 F (37.8 C)] 98.3 F (36.8  C) (10/04 0800) Pulse Rate:  [77-133] 96 (10/04 0900) Resp:  [8-31] 13 (10/04 0900) BP: (104-151)/(59-99) 124/76 (10/04 0900) SpO2:  [90 %-100 %] 97 % (10/04 0900) Weight:  [79.2 kg-80.4 kg] 79.2 kg (10/04 0424)  General Appearance: Alert, cooperative, no distress, appears stated age Head: Normocephalic, without obvious abnormality, atraumatic Eyes: PERRL, conjunctiva/corneas clear, EOM's intact, fundi benign, both eyes      Lungs: respirations unlabored Heart: Regular rate and rhythm Extremities: Extremities normal, atraumatic, no cyanosis or edema Pulses: 2+ and symmetric all extremities Skin: Skin color, texture, turgor normal, no rashes or lesions  NEUROLOGIC:   Mental status: A&O x4, no aphasia, good attention span, Memory and fund of knowledge Motor Exam - grossly normal, normal tone and bulk Sensory Exam - grossly normal Reflexes: symmetric, no pathologic reflexes, No Hoffman's, No clonus Coordination - grossly normal Gait - not tested Balance - not tested Cranial Nerves: I: smell Not tested  II: visual acuity  OS: na    OD: na  II: visual fields Full to confrontation  II: pupils Equal, round, reactive to light  III,VII: ptosis None  III,IV,VI: extraocular muscles  Full ROM  V: mastication Normal  V: facial light touch sensation  Normal  V,VII: corneal reflex  Present  VII: facial muscle function - upper  Normal  VII: facial muscle function - lower Normal  VIII: hearing Not tested  IX: soft palate elevation  Normal  IX,X: gag reflex Present  XI: trapezius strength  5/5  XI: sternocleidomastoid strength 5/5  XI: neck flexion strength  5/5  XII: tongue strength  Normal    Data Review Lab Results  Component Value Date   WBC 7.8 03/20/2024   HGB 9.1 (L) 03/20/2024   HCT 26.2 (L) 03/20/2024   MCV 83.7 03/20/2024   PLT 59 (L) 03/20/2024   Lab Results  Component Value Date   NA 130 (L) 03/19/2024   K 3.5 03/19/2024   CL 96 (L) 03/19/2024   CO2 20 (L)  03/19/2024   BUN 45 (H) 03/19/2024   CREATININE 1.76 (H) 03/19/2024   GLUCOSE 297 (H) 03/19/2024   Lab Results  Component Value Date   INR 1.3 (H) 03/19/2024    Radiology: CT HEAD WO CONTRAST ( ) Result Date: 03/20/2024 EXAM: CT HEAD WITHOUT CONTRAST 03/20/2024 02:05:54 AM TECHNIQUE: CT of the head was performed without the administration of intravenous contrast. Automated exposure control, iterative reconstruction, and/or weight based adjustment of the mA/kV was utilized to reduce the radiation dose to as low as reasonably achievable. COMPARISON: 03/19/2024 CLINICAL HISTORY: Subdural hematoma. Subdural hematoma. FINDINGS: BRAIN  AND VENTRICLES: Unchanged appearance of mixed density bilateral convexity subdural hematomas, 13 mm thick on the right and 8 mm on the left. No midline shift. No new site of hemorrhage. Chronic ischemic white matter changes. No evidence of acute infarct. No hydrocephalus. No mass effect. ORBITS: No acute abnormality. SINUSES: Partial opacification of the right maxillary and bilateral ethmoid sinuses. SOFT TISSUES AND SKULL: No acute soft tissue abnormality. No skull fracture. IMPRESSION: 1. Unchanged mixed-density bilateral convexity subdural hematomas, 13 mm right and 8 mm left, without midline shift. 2. No new intracranial hemorrhage. Electronically signed by: Franky Stanford MD 03/20/2024 02:21 AM EDT RP Workstation: [id number]   CT Head Wo Contrast Result Date: 03/19/2024 EXAM: CT HEAD WITHOUT CONTRAST 03/19/2024 05:43:14 PM TECHNIQUE: CT of the head was performed without the administration of intravenous contrast. Automated exposure control, iterative reconstruction, and/or weight based adjustment of the mA/kV was utilized to reduce the radiation dose to as low as reasonably achievable. COMPARISON: None available. CLINICAL HISTORY: Headache, increasing frequency or severity. FINDINGS: BRAIN AND VENTRICLES: A mixed density extra-axial hemorrhage over the right convexity  measures up to 12 mm. A less extensive extraaxial collection over the left convexity measures 7 mm. The effacement of the sulci is present bilaterally. No significant midline shift is present. Periventricular and subcortical white matter hypoattenuation is moderately advanced for age. This most likely reflects the sequelae of chronic microvascular ischemia. Atherosclerotic calcifications are present in the cavernous carotid arteries bilaterally and at the dural margin of both vertebral arteries. No hyperdense vessel is present. No evidence of acute infarct. No hydrocephalus. No mass effect. ORBITS: No acute abnormality. SINUSES: Chronic right maxillary sinus disease is present with wall thickening. A fluid level is present. Mild mucosal thickening is present in the sphenoid sinuses bilaterally. SOFT TISSUES AND SKULL: No acute soft tissue abnormality. No skull fracture. IMPRESSION: 1. Mixed-density bilateral convexity extra-axial hemorrhages (right up to 12 mm, left up to 7 mm) with bilateral sulcal effacement and no significant midline shift. The collections are consistent with bilateral acute/subacute subdural hematomas. 2. Moderately advanced chronic microvascular ischemic changes. 3. Intracranial atherosclerosis. Critical value findings were called to Dr. Lenor at 5:57 pm. Electronically signed by: Lonni Necessary MD 03/19/2024 06:01 PM EDT RP Workstation: [id number]   CT Angio Chest PE W and/or Wo Contrast Result Date: 03/19/2024 CLINICAL DATA:  Shortness of breath for 2 weeks. EXAM: CT ANGIOGRAPHY CHEST WITH CONTRAST TECHNIQUE: Multidetector CT imaging of the chest was performed using the standard protocol during bolus administration of intravenous contrast. Multiplanar CT image reconstructions and MIPs were obtained to evaluate the vascular anatomy. RADIATION DOSE REDUCTION: This exam was performed according to the departmental dose-optimization program which includes automated exposure control,  adjustment of the mA and/or kV according to patient size and/or use of iterative reconstruction technique. CONTRAST:  75mL OMNIPAQUE  IOHEXOL  350 MG/ML SOLN COMPARISON:  June 02, 2014. FINDINGS: Cardiovascular: Satisfactory opacification of the pulmonary arteries to the segmental level. No evidence of pulmonary embolism. Normal heart size. No pericardial effusion. Coronary artery calcifications are noted. 4 cm ascending thoracic aortic aneurysm is noted. Aortic atherosclerosis is noted. Mediastinum/Nodes: No enlarged mediastinal, hilar, or axillary lymph nodes. Thyroid  gland, trachea, and esophagus demonstrate no significant findings. Lungs/Pleura: Minimal bilateral pleural effusions are noted. No pneumothorax is noted. Minimal bibasilar subsegmental atelectasis is noted. Upper Abdomen: No acute abnormality. Musculoskeletal: No chest wall abnormality. No acute or significant osseous findings. Review of the MIP images confirms the above findings. IMPRESSION: 1. No definite evidence of pulmonary  embolus. 2. Coronary artery calcifications are noted suggesting coronary artery disease. 3. Minimal bilateral pleural effusions are noted with minimal bibasilar subsegmental atelectasis. 4. 4 cm ascending thoracic aortic aneurysm. Recommend annual imaging followup by CTA or MRA. This recommendation follows 2010 ACCF/AHA/AATS/ACR/ASA/SCA/SCAI/SIR/STS/SVM Guidelines for the Diagnosis and Management of Patients with Thoracic Aortic Disease. Circulation. 2010; 121: [id number]. Aortic aneurysm NOS (ICD10-I71.9). Aortic Atherosclerosis (ICD10-I70.0). Electronically Signed   By: Lynwood Landy Raddle M.D.   On: 03/19/2024 16:03   DG Chest Port 1 View Result Date: 03/19/2024 CLINICAL DATA:  Shortness of breath and weakness. EXAM: PORTABLE CHEST 1 VIEW COMPARISON:  03/06/2024 FINDINGS: Right IJ Port-A-Cath with tip over the SVC. Lungs are adequately inflated and otherwise clear. Cardiomediastinal silhouette and remainder of the exam is  unchanged. IMPRESSION: No acute cardiopulmonary disease. Electronically Signed   By: Toribio Agreste M.D.   On: 03/19/2024 11:53     Assessment/Plan: Very pleasant 78 year old presented with SOB and headaches yesterday. CT head shows a mixed density bilateral SDH, 13mm on the right and 8mm on the left. Follow up CT this morning was stable. We are not recommending surgery at this time especially given his low platelet count, the risks are too high. He may be a candidate for MMA embolization but will let Dr. Lanis determine that. Continue neuro checks and repeat head CT tomorrow morning    Suzen Lacks [facility] 03/20/2024 9:59 AM

## 2024-03-20 NOTE — Progress Notes (Signed)
 During shift report, it was noted the patient had a continuous D10 gtt running. D10 is contraindicated in patients with a head injury.  D10 gtt discontinued and daytime CCM (Dr. Harold) notified. CBG of 300+ this morning.

## 2024-03-20 NOTE — Consult Note (Signed)
 WOC Nurse Consult Note: Reason for Consult: sacral deep tissue injury  Wound type: Deep Tissue Pressure Injury sacrum and B buttocks purple maroon discoloration evolving with developing eschar  Pressure Injury POA: Yes Measurement: see nursing flowsheet  Wound bed: as above  Drainage (amount, consistency, odor) see nursing flowsheet  Periwound: erythema  Dressing procedure/placement/frequency: Cleanse sacrum and B buttocks with Vashe wound cleanser Soila [phone] do not rinse and allow to air dry.  Apply Xeroform gauze ([place] [phone]) to wound bed daily and secure with silicone foam or ABD pad and tape   Patient should be placed on a low air loss mattress for pressure redistribution and moisture management.   Deep Tissue Pressure Injuries are high risk to deteriorate.  Please reconsult WOC as needed.    Thank you,    Powell Bar MSN, RN-BC, [employer]

## 2024-03-21 ENCOUNTER — Inpatient Hospital Stay (HOSPITAL_COMMUNITY)

## 2024-03-21 DIAGNOSIS — C9200 Acute myeloblastic leukemia, not having achieved remission: Secondary | ICD-10-CM | POA: Diagnosis not present

## 2024-03-21 DIAGNOSIS — S065XAA Traumatic subdural hemorrhage with loss of consciousness status unknown, initial encounter: Secondary | ICD-10-CM | POA: Diagnosis not present

## 2024-03-21 DIAGNOSIS — D696 Thrombocytopenia, unspecified: Secondary | ICD-10-CM

## 2024-03-21 LAB — CBC WITH DIFFERENTIAL/PLATELET
Abs Immature Granulocytes: 0.5 K/uL — ABNORMAL HIGH (ref 0.00–0.07)
Abs Immature Granulocytes: 0.5 K/uL — ABNORMAL HIGH (ref 0.00–0.07)
Basophils Absolute: 0 K/uL (ref 0.0–0.1)
Basophils Absolute: 0.1 K/uL (ref 0.0–0.1)
Basophils Relative: 0 %
Basophils Relative: 1 %
Blasts: 15 %
Blasts: 16 %
Eosinophils Absolute: 0 K/uL (ref 0.0–0.5)
Eosinophils Absolute: 0 K/uL (ref 0.0–0.5)
Eosinophils Relative: 0 %
Eosinophils Relative: 0 %
HCT: 27.6 % — ABNORMAL LOW (ref 39.0–52.0)
HCT: 27.9 % — ABNORMAL LOW (ref 39.0–52.0)
Hemoglobin: 9.6 g/dL — ABNORMAL LOW (ref 13.0–17.0)
Hemoglobin: 9.4 g/dL — ABNORMAL LOW (ref 13.0–17.0)
Lymphocytes Relative: 37 %
Lymphocytes Relative: 35 %
Lymphs Abs: 3.1 K/uL (ref 0.7–4.0)
Lymphs Abs: 2.7 K/uL (ref 0.7–4.0)
MCH: 29.5 pg (ref 26.0–34.0)
MCH: 29.1 pg (ref 26.0–34.0)
MCHC: 34.8 g/dL (ref 30.0–36.0)
MCHC: 33.7 g/dL (ref 30.0–36.0)
MCV: 84.9 fL (ref 80.0–100.0)
MCV: 86.4 fL (ref 80.0–100.0)
Metamyelocytes Relative: 1 %
Monocytes Absolute: 0.8 K/uL (ref 0.1–1.0)
Monocytes Absolute: 1.4 K/uL — ABNORMAL HIGH (ref 0.1–1.0)
Monocytes Relative: 10 %
Monocytes Relative: 19 %
Myelocytes: 5 %
Myelocytes: 4 %
Neutro Abs: 2.7 K/uL (ref 1.7–7.7)
Neutro Abs: 1.7 K/uL (ref 1.7–7.7)
Neutrophils Relative %: 32 %
Neutrophils Relative %: 23 %
Platelets: 15 K/uL — CL (ref 150–400)
Platelets: 20 K/uL — CL (ref 150–400)
Promyelocytes Relative: 1 %
Promyelocytes Relative: 1 %
RBC: 3.25 MIL/uL — ABNORMAL LOW (ref 4.22–5.81)
RBC: 3.23 MIL/uL — ABNORMAL LOW (ref 4.22–5.81)
RDW: 15.9 % — ABNORMAL HIGH (ref 11.5–15.5)
RDW: 15.8 % — ABNORMAL HIGH (ref 11.5–15.5)
WBC: 8.3 K/uL (ref 4.0–10.5)
WBC: 7.6 K/uL (ref 4.0–10.5)
nRBC: 0 % (ref 0.0–0.2)
nRBC: 0 % (ref 0.0–0.2)

## 2024-03-21 LAB — BASIC METABOLIC PANEL WITH GFR
Anion gap: 11 (ref 5–15)
BUN: 30 mg/dL — ABNORMAL HIGH (ref 8–23)
CO2: 24 mmol/L (ref 22–32)
Calcium: 7.6 mg/dL — ABNORMAL LOW (ref 8.9–10.3)
Chloride: 99 mmol/L (ref 98–111)
Creatinine, Ser: 1.66 mg/dL — ABNORMAL HIGH (ref 0.61–1.24)
GFR, Estimated: 42 mL/min — ABNORMAL LOW (ref >60)
Glucose, Bld: 124 mg/dL — ABNORMAL HIGH (ref 70–99)
Potassium: 3.3 mmol/L — ABNORMAL LOW (ref 3.5–5.1)
Sodium: 134 mmol/L — ABNORMAL LOW (ref 135–145)

## 2024-03-21 LAB — GLUCOSE, CAPILLARY
Glucose-Capillary: 106 mg/dL — ABNORMAL HIGH (ref 70–99)
Glucose-Capillary: 154 mg/dL — ABNORMAL HIGH (ref 70–99)
Glucose-Capillary: 155 mg/dL — ABNORMAL HIGH (ref 70–99)
Glucose-Capillary: 173 mg/dL — ABNORMAL HIGH (ref 70–99)
Glucose-Capillary: 224 mg/dL — ABNORMAL HIGH (ref 70–99)
Glucose-Capillary: 259 mg/dL — ABNORMAL HIGH (ref 70–99)

## 2024-03-21 LAB — MAGNESIUM: Magnesium: 1.6 mg/dL — ABNORMAL LOW (ref 1.7–2.4)

## 2024-03-21 LAB — PHOSPHORUS: Phosphorus: 5 mg/dL — ABNORMAL HIGH (ref 2.5–4.6)

## 2024-03-21 MED ORDER — OXYCODONE HCL 5 MG PO TABS
2.5000 mg | ORAL_TABLET | ORAL | Status: DC | PRN
Start: 2024-03-21 — End: 2024-03-22
  Administered 2024-03-21: 2.5 mg via ORAL
  Filled 2024-03-21: qty 1

## 2024-03-21 MED ORDER — SODIUM CHLORIDE 0.9% IV SOLUTION: Freq: Once | INTRAVENOUS | Status: AC

## 2024-03-21 MED ORDER — ACETAMINOPHEN 325 MG PO TABS
650.0000 mg | ORAL_TABLET | Freq: Once | ORAL | Status: AC
  Administered 2024-03-21: 650 mg via ORAL
  Filled 2024-03-21: qty 2

## 2024-03-21 MED ORDER — MAGNESIUM SULFATE 4 GM/100ML IV SOLN
4.0000 g | Freq: Once | INTRAVENOUS | Status: AC
  Administered 2024-03-21: 4 g via INTRAVENOUS
  Filled 2024-03-21: qty 100

## 2024-03-21 MED ORDER — POTASSIUM CHLORIDE CRYS ER 20 MEQ PO TBCR
30.0000 meq | EXTENDED_RELEASE_TABLET | ORAL | Status: AC
  Administered 2024-03-21 (×2): 30 meq via ORAL
  Filled 2024-03-21 (×2): qty 1

## 2024-03-21 MED ORDER — METOPROLOL TARTRATE 25 MG PO TABS
25.0000 mg | ORAL_TABLET | Freq: Two times a day (BID) | ORAL | Status: DC
Start: 2024-03-21 — End: 2024-03-27
  Administered 2024-03-21 – 2024-03-26 (×11): 25 mg via ORAL
  Filled 2024-03-21 (×11): qty 1

## 2024-03-21 MED ORDER — ENSURE PLUS HIGH PROTEIN PO LIQD
237.0000 mL | Freq: Two times a day (BID) | ORAL | Status: DC
Start: 2024-03-21 — End: 2024-03-29
  Administered 2024-03-21 – 2024-03-28 (×12): 237 mL via ORAL

## 2024-03-21 NOTE — Consult Note (Addendum)
 Paul [name]   HEMATOLOGY/ONCOLOGY CONSULTATION NOTE  Date of Service: 03/21/2024  Patient Care Team: Paul Lynwood Carwin, MD as PCP - General (Family Medicine) Paul Ezra RAMAN, MD as PCP - Cardiology (Cardiology) Paul Elspeth BROCKS, MD as PCP - Electrophysiology (Cardiology)  CHIEF COMPLAINTS/PURPOSE OF CONSULTATION:  Recommendations regarding management of management of thrombocytopenia in the context of AML and subdural hematomas.  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 Patient is currently somewhat sleepy and groggy and is unable to provide much information.  Most of the information is obtained from his outside medical records.  Oncologic history was reviewed as noted below summarized from note at [facility].  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  He was treated for this and eventually progressed to AML in December 2024 and has been on clinical trials for 2 different lines of therapy since then for his AML which is currently not in remission.  He is currently on hydroxyurea  plus a study drug [id number] as a part of the DSP clinical trial from 02/09/2024.  After cycle 1 he was found to have persistent disease and was admitted to the hospital with syncope and hypotension. Marrow following second cycle on the DSP clinical trial which showed 80% blasts and per notes he was having increasing fatigue decreased mental  acuity in the presence of leukocytosis and falls.  He was slightly reduced with hydroxyurea .  Was noted to have a small subdural hematoma. He was needing transfusion support for his cytopenias especially thrombocytopenia.  He was recommended to use a rollator walker due to poor balance.  He was last seen in hematology clinic by Oley Jon Morrison, AGNP on 03/15/2024.  He has recently retired from Administrator, arts due to feeling worse.  Patient was admitted to the hospital on 03/19/2024 with increasing shortness of breath and generalized weakness.  In the emergency room he was noted to have severe thrombocytopenia with platelet counts of less than 5000 and hemoglobin of 8. He has been receiving platelet transfusions.  EKG apparently showed atrial flutter with a heart rate of 105.  Patient had a CT of the chest on 03/19/2024 which showed no evidence of PE.  Minimal bilateral pleural effusions.  4 cm ascending thoracic aortic aneurysm.  CT head without contrast on 03/19/2024 showed Mixed-density bilateral convexity extra-axial hemorrhages (right up to 12mm, left up to 7 mm) with bilateral sulcal effacement and no significant midline shift. The collections are consistent with bilateral acute/subacute subdural hematomas.  CT head was repeated again on 03/20/2024 unchanged mixed density bilateral convexity subdural hematomas 13 mm right and 8 mm left without midline shift.  No new bleeds noted.  Patient has been receiving platelet transfusions.  I discussed with Paul. Monte that we would recommend transfusing to a platelet count of 50k for now in the context of some progression of his subdural bleeds.  He also has a very complex hematologic situation with his AML progressing actively  which will also lead to persistent and worsening cytopenias in addition to his hydroxyurea  and treatment drugs.  There is also uncertain if there is any concern for AML progression in the brain causing the bleeds or if these are  purely driven by his fall and thrombocytopenia.  I discussed with the patient and the hospitalist that we has hematology team do not manage AML due to the complexity of his treatments and we do not have the inpatient or outpatient band with to support optimization of his AML treatment as well as transfusion support.  He has been placed on a transfer list to [facility] for his hematology team to admit him to the inpatient leukemia service to address his AML related complications of subdural bleeds.    Oncologic history as noted by his hematology team on 03/15/2024 at [facility]  Hematology/Oncology History Overview Note  Diagnosis: MDS with TP53 mutation  Presentation: On 03/04/23 presented to local ED with approximately 2 week history of increasing shortness of breath and on that morning some nausea and pain between shoulder blades. Initially thought to be cardiac given extensive history. Labs with WBC 14.2, hgb 10.5, plt 135, ANC 6.4.  03/06/23 bone marrow biopsy A,B. BONE MARROW, LEFT ILIAC (ASPIRATE SMEARS, CORE BIOPSY, AND CLOT SECTION): - MARKEDLY HYPERCELLULAR BONE MARROW (~80-90%) WITH DYSGRANULOPOIESIS, DYSERYTHROPOIESIS, AND INCREASED BLASTS (5-7%). SEE COMMENT. Karyotype: 45,XY,der(2)ins(2;?)(p13;?),del(4)(q21q25),-5,add(17)(q11.2)[1]/45,idem,der(7)t(7;15)(q22;q21),-8,-12,-15, add(16)(p13.1),del(16)(q22),-17,+21,+r,+75mar[18]/46,XY[1]  Interpretation: ABNORMAL MALE KARYOTYPE - COMPLEX  Molecular Testing- TET2 VAF 75.4% TP53 VAF 77.1%  03/20/2023 BMBx Bone marrow, right iliac, aspiration and biopsy - Hypercellular bone marrow (60-70%) with left-shifted and dysplastic myelomonocytic cells and 5-10% blasts by CD34 immunohistochemistry  RESULTS  Date Value Ref Range Status  03/20/2023 Preliminary  Abnormal Karyotype:   44~45,XY,der(2)ins(2;?)(p13;?),del(4)(q21q25),-5,der(7)t(7;15)(q22q21),+8,add(8)(p11.2)x2,-12,-15,del(16)(q22q24),der(16)t(16;17)(p13.3;q11.2),-17,der(17)t(5;17)(p13;q?23),+21,+r[9]/46,XY[1]  Abnormal FISH: An EGR1 interphase FISH assay shows an abnormal signal pattern, consistent with the loss of 5q31 material seen by G-banding, in 82% of the 100 cells examined.  Abnormal FISH: An interphase FISH assay showed a signal pattern consistent with TP53 deletion 85% of the 100 cells examined.     Variants of Known/Likely Clinical Significance:  Gene Coding Predicted Protein Variant allele fraction  TET2 c.3412C>T p.([id number]*) 32.4%  TP53 c.832C>T p.(Pro278Ser) 33.5%   Cycle 1: 04/03/2023 [id number] azacitidine  75mg /m2 x 7 days + AK117 30mg /kg q 2 weeks (or placebo)  Cycle 2: 04/30/2023 [id number] azacitidine  75mg /m2 x 7 days + AK117 30mg /kg q 2 weeks (or placebo)  Diagnosis  Date Value Ref Range Status  05/21/2023 Final  Bone marrow, right iliac, aspiration and biopsy - Hypercellular bone marrow (70%) involved by persistent myeloid neoplasm (41% blasts/dysplastic myelomonocytic cells by manual aspirate differential, and 10% blasts by CD34 immunohistochemistry) (see Comment)   Cycle 3: 05/28/2023 Cycle 4: 06/25/2023  Diagnosis  Date Value Ref Range Status  07/17/2023 Final  Bone marrow, right iliac, aspiration and biopsy - Hypercellular bone marrow (80%) involved by persistent high-grade myeloid neoplasm (45% blasts/dysplastic myelomonocytic cells by manual aspirate differential) (see Comment)   TAGALONG clinical trial Cycle 1: 08/04/2023: azacitidine  75mg /m2 x 7 days + tagraxofusp 12mcg/kg D1-3 Cycle 2: 09/01/2023  Diagnosis  Date Value Ref Range Status  09/22/2023 Final  Bone marrow, right iliac, aspiration and biopsy - Hypercellular bone marrow (50%) with persistent involvement by high-grade myeloid neoplasm (39% blasts/dysplastic myelomonocytic cells by manual aspirate differential) - See  linked reports for associated Ancillary Studies.   This electronic signature is attestation that the pathologist personally reviewed the submitted material(s) and the final diagnosis reflects that evaluation.  Cycle 3: 09/30/2023 Azacitidine /Tagraxofusp/Venetoclax x 21 days  BMbx: 10/20/2023 Diagnosis  Bone marrow, right iliac, aspiration and biopsy - Normocellular bone marrow (30%) with persistent involvement by high-grade myeloid neoplasm (17% blasts by manual aspirate differential)   Cycle 4: 10/27/2023  Diagnosis  Date Value Ref Range Status  11/17/2023 Final  Bone marrow, right iliac, aspiration and biopsy - Hypocellular bone marrow (10%) with trilineage hematopoiesis and 1% blasts by manual aspirate differential - See linked reports for associated Ancillary Studies  - flow MRD 25%   Cycle 5: 11/24/2023  Diagnosis  Date Value Ref Range Status  01/09/2024 Final  Bone marrow, right iliac, aspiration and biopsy - Hypercellular bone marrow (60%) with persistent acute myeloid leukemia (51% blasts by manual aspirate differential)  - See linked reports for associated Ancillary Studies.   This electronic signature is attestation that the pathologist personally reviewed the submitted material(s) and the final diagnosis reflects that evaluation.   DSP clinical trial 300mg  BID Cycle 1: 02/09/2024 Cycle 2: 03/15/2024  MDS (myelodysplastic syndrome) (CMS-HCC)  03/05/2023 Initial Diagnosis  Suspected Myelodysplastic disease (CMS-HCC)  Myelodysplastic syndrome (CMS-HCC)  04/14/2023 Initial Diagnosis  Myelodysplastic syndrome   AML (acute myeloblastic leukemia) (CMS-HCC)  07/25/2023 Initial Diagnosis  AML (acute myeloblastic leukemia) You kidding me what is there is I get smashedWas seen in medical on follow-up  MEDICAL HISTORY:  Past Medical History:  Diagnosis Date   Aftercare following removal/replacement defibrillator    a. defibrillator removed 03/2019.   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 03/06/2015   NICM (nonischemic cardiomyopathy) (HCC)    Sleep apnea    cpap   Type II diabetes mellitus (HCC)    Ventricular tachycardia (HCC)    observed during stress testing, subsequent VT on EPS   Wears glasses     SURGICAL HISTORY: Past Surgical History:  Procedure Laterality Date   ABLATION  06-01-2013   PVC's ablated along basal inferoseptal RV by Paul Kelsie   APPENDECTOMY  1969   CARDIAC CATHETERIZATION  04/2010   CARDIAC DEFIBRILLATOR PLACEMENT  05/09/2010   MDT ICD implanted in [place] by Paul Junette   CATARACT EXTRACTION W/ INTRAOCULAR LENS  IMPLANT, BILATERAL Bilateral    ICD LEAD REMOVAL N/A 03/19/2019   Procedure: ICD LEAD AND GENERATOR REMOVAL;  Surgeon: Waddell Danelle ORN, MD;  Location: [facility] OR;  Service: Cardiovascular;  Laterality: N/A;   KNEE ARTHROSCOPY Left 1984   LUMBAR LAMINECTOMY  ~ 2004   TONSILLECTOMY AND ADENOIDECTOMY  1972   TOTAL KNEE ARTHROPLASTY Left 04/28/2017   TOTAL KNEE ARTHROPLASTY Left 04/28/2017   Procedure: TOTAL KNEE ARTHROPLASTY LEFT;  Surgeon: Yvone Rush, MD;  Location: [facility] OR;  Service: Orthopedics;  Laterality: Left;   TOTAL KNEE ARTHROPLASTY Right 05/07/2019   Procedure: TOTAL KNEE ARTHROPLASTY;  Surgeon: Yvone Rush, MD;  Location: [facility] ORS;  Service: Orthopedics;  Laterality: Right;   V-TACH ABLATION N/A 06/01/2013   Procedure: V-TACH ABLATION;  Surgeon: Lynwood JONETTA Rakers, MD;  Location: [facility] CATH LAB;  Service: Cardiovascular;  Laterality: N/A;   VASECTOMY     VENTRICULAR ABLATION SURGERY  06/01/2013    SOCIAL HISTORY: Social History   Socioeconomic History   Marital status: Married    Spouse name: Not on file   Number of children: Not on file   Years of education: Not on file   Highest education level: Not on file  Occupational History   Occupation: Occupational Medicine Physician  Tobacco Use   Smoking status: Never   Smokeless tobacco: Never  Vaping Use   Vaping status: Never Used  Substance and Sexual Activity   Alcohol  use: Yes    Comment: social   Drug use: No   Sexual activity: Yes  Other Topics Concern   Not on file  Social History Narrative   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    Moved here from [place] 1.5 years ago   Social Drivers of [employer]: Low Risk  (03/12/2024)   Received from [facility]   Overall Financial Resource Strain (CARDIA)    How hard is it for you to pay for the very basics like food, housing, medical care, and heating?: Not very hard  Food Insecurity: No Food Insecurity (03/19/2024)   Hunger Vital Sign    Worried About Running Out of Food in the Last Year: Never true    Ran Out of Food in the Last Year: Never true  Transportation Needs: No Transportation Needs (03/19/2024)   PRAPARE - Administrator, Civil Service (Medical): No    Lack of Transportation (Non-Medical): No  Physical Activity: Inactive (01/23/2024)   Received from [facility]   Exercise Vital Sign    On average, how many days per week do you engage in moderate to strenuous exercise (like a brisk walk)?: 0 days    Minutes of Exercise per Session: Not on file  Stress: No Stress Concern Present (01/23/2024)   Received from [facility] of Occupational Health - Occupational Stress Questionnaire    Do you feel stress  - tense, restless, nervous, or anxious, or unable to sleep at night because your mind is troubled all the time - these days?: Not at all  Social Connections: Socially Integrated (03/19/2024)   Social Connection and Isolation Panel    Frequency of Communication with Friends and Family: More than three times a week    Frequency of Social Gatherings with Friends and Family: More than three times a week    Attends Religious Services: More than 4 times per year    Active Member of [employer] or Organizations: Yes    Attends Banker Meetings: Never    Marital Status: Married  Catering manager Violence: Unknown (03/19/2024)   Humiliation, Afraid, Rape, and Kick questionnaire    Fear of Current or Ex-Partner: No    Emotionally Abused: No    Physically Abused: Not on file    Sexually Abused: No    FAMILY HISTORY: Family History  Problem Relation Age of Onset   Alcohol  abuse Mother    COPD Mother    Depression Mother    Heart disease Mother    Hypertension Mother    Stroke Mother    COPD Father    Heart disease Father  Hypertension Father    Stroke Father    Cancer Father    Diabetes Sister    Mental retardation Sister    Learning disabilities Sister    Hypertension Sister    Alcohol  abuse Brother    Drug abuse Brother    Heart disease Brother    Hypertension Brother    Coronary artery disease Brother    Colon cancer Neg Hx    Pancreatic cancer Neg Hx    Esophageal cancer Neg Hx    Stomach cancer Neg Hx    Liver disease Neg Hx    Rectal cancer Neg Hx     ALLERGIES:  is allergic to lisinopril.  MEDICATIONS:  Current Facility-Administered Medications  Medication Dose Route Frequency Provider Last Rate Last Admin   0.9 %  sodium chloride  infusion (Manually program via Guardrails IV Fluids)   Intravenous Once Perri DELENA Meliton Mickey., MD       Chlorhexidine  Gluconate Cloth 2 % PADS 6 each  6 each Topical Daily Bowser, Grace E, NP   6 each at 03/21/24 1000   docusate  sodium (COLACE) capsule 100 mg  100 mg Oral BID PRN Bowser, Grace E, NP       famotidine  (PEPCID ) tablet 20 mg  20 mg Oral BID Paliwal, Aditya, MD   20 mg at 03/21/24 1003   feeding supplement (ENSURE PLUS HIGH PROTEIN) liquid 237 mL  237 mL Oral BID BM Perri DELENA Meliton Mickey., MD   237 mL at 03/21/24 1401   finasteride  (PROSCAR ) tablet 5 mg  5 mg Oral Daily [name], Ekta V, MD   5 mg at 03/21/24 1003   hydroxyurea  (HYDREA ) capsule 2,000 mg  2,000 mg Oral BID [name], Ekta V, MD   2,000 mg at 03/21/24 1004   insulin  aspart (novoLOG ) injection 0-15 Units  0-15 Units Subcutaneous Q4H Paliwal, Ria, MD   3 Units at 03/21/24 1620   labetalol (NORMODYNE) injection 10 mg  10 mg Intravenous Q2H PRN Paliwal, Ria, MD       levETIRAcetam (KEPPRA) undiluted injection 500 mg  500 mg Intravenous Q12H Bowser, Grace E, NP   500 mg at 03/21/24 0848   LORazepam  (ATIVAN ) tablet 1 mg  1 mg Oral QHS Chand, Sudham, MD   1 mg at 03/20/24 2114   metoprolol  tartrate (LOPRESSOR ) tablet 25 mg  25 mg Oral BID Perri DELENA Meliton Mickey., MD       mexiletine (MEXITIL ) capsule 200 mg  200 mg Oral BID [name], Ekta V, MD   200 mg at 03/21/24 1003   ondansetron  (ZOFRAN ) injection 4 mg  4 mg Intravenous Q6H PRN Bowser, Grace E, NP       oxyCODONE  (Oxy IR/ROXICODONE ) immediate release tablet 2.5 mg  2.5 mg Oral Q4H PRN Perri DELENA Meliton Mickey., MD   2.5 mg at 03/21/24 1822   polyethylene glycol (MIRALAX  / GLYCOLAX ) packet 17 g  17 g Oral Daily PRN Bowser, Grace E, NP       rosuvastatin  (CRESTOR ) tablet 20 mg  20 mg Oral QHS [name], Ekta V, MD   20 mg at 03/20/24 2115   sodium chloride  flush (NS) 0.9 % injection 10-40 mL  10-40 mL Intracatheter Q12H Chand, Sudham, MD   30 mL at 03/21/24 1143   sodium chloride  flush (NS) 0.9 % injection 10-40 mL  10-40 mL Intracatheter PRN Harold Scholz, MD       tamsulosin  (FLOMAX ) capsule 0.8 mg  0.8 mg Oral QHS [name], Ekta V, MD  0.8 mg at 03/20/24 2114   terbinafine  (LAMISIL ) tablet 250 mg  250 mg Oral  Daily Chand, Sudham, MD   250 mg at 03/21/24 1003   valACYclovir  (VALTREX ) tablet 500 mg  500 mg Oral Daily [name], Ekta V, MD   500 mg at 03/21/24 1003   voriconazole  (VFEND ) tablet 200 mg  200 mg Oral BID [name], Ekta V, MD   200 mg at 03/21/24 1004    REVIEW OF SYSTEMS:    10 Point review of Systems was done is negative except as noted above.  PHYSICAL EXAMINATION: ECOG PERFORMANCE STATUS: 3 - Symptomatic, >50% confined to bed  . Vitals:   03/21/24 1302 03/21/24 1602  BP: 107/72 (!) 113/58  Pulse: (!) 105 83  Resp: 20 16  Temp: 98.3 F (36.8 C) 98.9 F (37.2 C)  SpO2: 100% 96%   Filed Weights   03/19/24 1118 03/20/24 0424 03/21/24 0412  Weight: 177 lb 4 oz (80.4 kg) 174 lb 9.7 oz (79.2 kg) 182 lb 8.7 oz (82.8 kg)   .Body mass index is 26.19 kg/m.  GENERAL:alert, in no acute distress and comfortable SKIN: no acute rashes, no significant lesions EYES: conjunctiva are pink and non-injected, sclera anicteric OROPHARYNX: MMM, no exudates, no oropharyngeal erythema or ulceration NECK: supple, no JVD LYMPH:  no palpable lymphadenopathy in the cervical, axillary or inguinal regions LUNGS: clear to auscultation b/l with normal respiratory effort HEART: regular rate & rhythm ABDOMEN:  normoactive bowel sounds , non tender, not distended. Extremity: no pedal edema PSYCH: alert & oriented x 3 with fluent speech NEURO: no focal motor/sensory deficits  LABORATORY DATA:  I have reviewed the data as listed  .    Latest Ref Rng & Units 03/21/2024    6:34 PM 03/21/2024    5:00 AM 03/20/2024    4:27 AM  CBC  WBC 4.0 - 10.5 K/uL 7.6  8.3  7.8   Hemoglobin 13.0 - 17.0 g/dL 9.4  9.6  9.1   Hematocrit 39.0 - 52.0 % 27.9  27.6  26.2   Platelets 150 - 400 K/uL 20  15  59     .    Latest Ref Rng & Units 03/21/2024   10:15 AM 03/19/2024   11:35 AM 03/10/2024    7:52 AM  CMP  Glucose 70 - 99 mg/dL 875  702  835   BUN 8 - 23 mg/dL 30  45  42   Creatinine 0.61 - 1.24 mg/dL 8.33  8.23   8.18   Sodium 135 - 145 mmol/L 134  130  133   Potassium 3.5 - 5.1 mmol/L 3.3  3.5  3.6   Chloride 98 - 111 mmol/L 99  96  97   CO2 22 - 32 mmol/L 24  20  23    Calcium  8.9 - 10.3 mg/dL 7.6  7.7  8.0   Total Protein 6.5 - 8.1 g/dL  5.1  4.9   Total Bilirubin 0.0 - 1.2 mg/dL  1.2  1.4   Alkaline Phos 38 - 126 U/L  94  327   AST 15 - 41 U/L  12  29   ALT 0 - 44 U/L  13  33      RADIOGRAPHIC STUDIES: I have personally reviewed the radiological images as listed and agreed with the findings in the report. CT HEAD WO CONTRAST ( ) Result Date: 03/21/2024 EXAM: CT HEAD WITHOUT CONTRAST 03/21/2024 07:27:55 AM TECHNIQUE: CT of the head was performed without the administration of  intravenous contrast. Automated exposure control, iterative reconstruction, and/or weight based adjustment of the mA/kV was utilized to reduce the radiation dose to as low as reasonably achievable. COMPARISON: CT of the head dated 03/20/2024. CLINICAL HISTORY: Subdural hematoma. FINDINGS: BRAIN AND VENTRICLES: There are mixed attenuation subdural hematomas again demonstrated bilaterally, again more pronounced on the right, which are unchanged in size and appearance in the interim. No evidence of acute infarct. No hydrocephalus. No mass effect or midline shift. There is age-related cerebral atrophy. There is small-to-moderate periventricular white matter disease. There are calcifications within the carotid siphons. ORBITS: No acute abnormality. SINUSES: There is mild-to-moderate mucosal disease within the paranasal sinuses. SOFT TISSUES AND SKULL: No acute soft tissue abnormality. No skull fracture. IMPRESSION: 1. Mixed attenuation bilateral subdural hematomas, right greater than left, unchanged in size and appearance since 03/20/24. 2. Age-related cerebral atrophy and small-to-moderate periventricular white matter disease. Electronically signed by: Evalene Coho MD 03/21/2024 07:40 AM EDT RP Workstation: [id number]   CT HEAD WO  CONTRAST ( ) Result Date: 03/20/2024 EXAM: CT HEAD WITHOUT CONTRAST 03/20/2024 02:05:54 AM TECHNIQUE: CT of the head was performed without the administration of intravenous contrast. Automated exposure control, iterative reconstruction, and/or weight based adjustment of the mA/kV was utilized to reduce the radiation dose to as low as reasonably achievable. COMPARISON: 03/19/2024 CLINICAL HISTORY: Subdural hematoma. Subdural hematoma. FINDINGS: BRAIN AND VENTRICLES: Unchanged appearance of mixed density bilateral convexity subdural hematomas, 13 mm thick on the right and 8 mm on the left. No midline shift. No new site of hemorrhage. Chronic ischemic white matter changes. No evidence of acute infarct. No hydrocephalus. No mass effect. ORBITS: No acute abnormality. SINUSES: Partial opacification of the right maxillary and bilateral ethmoid sinuses. SOFT TISSUES AND SKULL: No acute soft tissue abnormality. No skull fracture. IMPRESSION: 1. Unchanged mixed-density bilateral convexity subdural hematomas, 13 mm right and 8 mm left, without midline shift. 2. No new intracranial hemorrhage. Electronically signed by: Franky Stanford MD 03/20/2024 02:21 AM EDT RP Workstation: [id number]   CT Head Wo Contrast Result Date: 03/19/2024 EXAM: CT HEAD WITHOUT CONTRAST 03/19/2024 05:43:14 PM TECHNIQUE: CT of the head was performed without the administration of intravenous contrast. Automated exposure control, iterative reconstruction, and/or weight based adjustment of the mA/kV was utilized to reduce the radiation dose to as low as reasonably achievable. COMPARISON: None available. CLINICAL HISTORY: Headache, increasing frequency or severity. FINDINGS: BRAIN AND VENTRICLES: A mixed density extra-axial hemorrhage over the right convexity measures up to 12 mm. A less extensive extraaxial collection over the left convexity measures 7 mm. The effacement of the sulci is present bilaterally. No significant midline shift is present.  Periventricular and subcortical white matter hypoattenuation is moderately advanced for age. This most likely reflects the sequelae of chronic microvascular ischemia. Atherosclerotic calcifications are present in the cavernous carotid arteries bilaterally and at the dural margin of both vertebral arteries. No hyperdense vessel is present. No evidence of acute infarct. No hydrocephalus. No mass effect. ORBITS: No acute abnormality. SINUSES: Chronic right maxillary sinus disease is present with wall thickening. A fluid level is present. Mild mucosal thickening is present in the sphenoid sinuses bilaterally. SOFT TISSUES AND SKULL: No acute soft tissue abnormality. No skull fracture. IMPRESSION: 1. Mixed-density bilateral convexity extra-axial hemorrhages (right up to 12 mm, left up to 7 mm) with bilateral sulcal effacement and no significant midline shift. The collections are consistent with bilateral acute/subacute subdural hematomas. 2. Moderately advanced chronic microvascular ischemic changes. 3. Intracranial atherosclerosis. Critical value findings were called to  Paul. Lenor at 5:57 pm. Electronically signed by: Lonni Necessary MD 03/19/2024 06:01 PM EDT RP Workstation: [id number]   CT Angio Chest PE W and/or Wo Contrast Result Date: 03/19/2024 CLINICAL DATA:  Shortness of breath for 2 weeks. EXAM: CT ANGIOGRAPHY CHEST WITH CONTRAST TECHNIQUE: Multidetector CT imaging of the chest was performed using the standard protocol during bolus administration of intravenous contrast. Multiplanar CT image reconstructions and MIPs were obtained to evaluate the vascular anatomy. RADIATION DOSE REDUCTION: This exam was performed according to the departmental dose-optimization program which includes automated exposure control, adjustment of the mA and/or kV according to patient size and/or use of iterative reconstruction technique. CONTRAST:  75mL OMNIPAQUE  IOHEXOL  350 MG/ML SOLN COMPARISON:  June 02, 2014. FINDINGS:  Cardiovascular: Satisfactory opacification of the pulmonary arteries to the segmental level. No evidence of pulmonary embolism. Normal heart size. No pericardial effusion. Coronary artery calcifications are noted. 4 cm ascending thoracic aortic aneurysm is noted. Aortic atherosclerosis is noted. Mediastinum/Nodes: No enlarged mediastinal, hilar, or axillary lymph nodes. Thyroid  gland, trachea, and esophagus demonstrate no significant findings. Lungs/Pleura: Minimal bilateral pleural effusions are noted. No pneumothorax is noted. Minimal bibasilar subsegmental atelectasis is noted. Upper Abdomen: No acute abnormality. Musculoskeletal: No chest wall abnormality. No acute or significant osseous findings. Review of the MIP images confirms the above findings. IMPRESSION: 1. No definite evidence of pulmonary embolus. 2. Coronary artery calcifications are noted suggesting coronary artery disease. 3. Minimal bilateral pleural effusions are noted with minimal bibasilar subsegmental atelectasis. 4. 4 cm ascending thoracic aortic aneurysm. Recommend annual imaging followup by CTA or MRA. This recommendation follows 2010 ACCF/AHA/AATS/ACR/ASA/SCA/SCAI/SIR/STS/SVM Guidelines for the Diagnosis and Management of Patients with Thoracic Aortic Disease. Circulation. 2010; 121: Z733-z630. Aortic aneurysm NOS (ICD10-I71.9). Aortic Atherosclerosis (ICD10-I70.0). Electronically Signed   By: Lynwood Landy Raddle M.D.   On: 03/19/2024 16:03   DG Chest Port 1 View Result Date: 03/19/2024 CLINICAL DATA:  Shortness of breath and weakness. EXAM: PORTABLE CHEST 1 VIEW COMPARISON:  03/06/2024 FINDINGS: Right IJ Port-A-Cath with tip over the SVC. Lungs are adequately inflated and otherwise clear. Cardiomediastinal silhouette and remainder of the exam is unchanged. IMPRESSION: No acute cardiopulmonary disease. Electronically Signed   By: Toribio Agreste M.D.   On: 03/19/2024 11:53   DG Chest Portable 1 View Result Date: 03/06/2024 CLINICAL DATA:   Shortness of breath EXAM: PORTABLE CHEST 1 VIEW COMPARISON:  05/05/2019 FINDINGS: Right Port-A-Cath in place with the tip in the right atrium. Heart and mediastinal contours are within normal limits. No focal opacities or effusions. No acute bony abnormality. IMPRESSION: No active disease. Electronically Signed   By: Franky Crease M.D.   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 Extensive treatment history for his MDS and AML as summarized above in the oncologic history. Currently is progressing on second line AML treatment under clinical trial with resulting significant cytopenias, progressive fatigue, mental status changes and balance issues. Also is having worsening performance status and increasing transfusion requirements.  #2 thrombocytopenia related to his AML as well as AML related treatments.  #3 bilateral subdural hematomas.  Neurosurgery Paul. Lanis involved.  No surgical intervention planned at this time.  Patient notes he has been having balance issues with falls and is now using a rolling walker.  #4  Patient Active Problem  List   Diagnosis Date Noted   Subdural hematoma (HCC) 03/19/2024   Generalized weakness 03/07/2024   Hyperlipidemia associated with type 2 diabetes mellitus (HCC) 02/08/2024   AML (acute myeloblastic leukemia) (HCC) 07/25/2023   Myelodysplastic syndrome (HCC) 03/05/2023   HFrEF (heart failure with reduced ejection fraction) (HCC) 03/04/2023   Stage 3b chronic kidney disease (HCC) 03/04/2023   History of subdural hematoma 01/17/2023   BPH (benign prostatic hyperplasia) 04/02/2022   TIA (transient ischemic attack) 07/02/2019   Primary osteoarthritis of right knee 05/07/2019   Aftercare following removal or replacement of defibrillator  03/20/2019   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  Also has had some oxycodone  for pain control. - I discussed with the patient and the hospitalist Paul. Perri that he will need platelet transfusions to maintain his platelets at greater than equal to 50k in the context of subdural hematoma progression. -Patient's APTT and PT do not suggest any overt coagulopathy. =-Would recommend checking fibrinogen  level in the morning. - His thrombocytopenia is likely due to AML as well as AML related treatments and are unlikely to improve by themselves in the context of persistent and likely progressive AML.  - Previous hematology service do not manage acute myeloid leukemia and do not have the benefit to handle significant cytopenias requiring very aggressive transfusion support without a clear endpoint in the absence of moderating AML treatment. - Would recommend urgent and immediate transfer to [facility] to his leukemia team to address ongoing treatments for his AML, goals of care discussions, transfusion support and management of toxicities from his clinical trial. - Given the unpredictable nature of his AML progression and cytopenias there is no way to predictably set him up for transfusion support alone at this time. - Would recommend holding hydroxyurea  at this time till he can transfer to [facility].  If his WBC count start rapidly increasing above 10-12k will need to restart him on hydroxyurea  for  cytoreduction. - Clinical trial drugs would be held at this time. - Fall precautions. - Please call primary oncology/hematology team at [facility] for further advice. - Call hematology at [facility] if any questions regarding transfusion support.  - No other active hematologic input at this time.  Patient really needs to transfer to [facility] for continued cares. - All the recommendations were communicated to the hospitalist.  The total time spent in the appointment was 81 minutes*.  All of the patient's questions were answered with apparent satisfaction. The patient knows to call the clinic with any problems, questions or concerns.   Emaline Saran MD MS AAHIVMS [facility] Hematology/Oncology Physician [facility]  .*Total Encounter Time as defined by the Centers for Medicare and Medicaid Services includes, in addition to the face-to-face time of a patient visit (documented in the note above) non-face-to-face time: obtaining and reviewing outside history, ordering and reviewing medications, tests or procedures, care coordination (communications with other health care professionals or caregivers) and documentation in the medical record.

## 2024-03-21 NOTE — Progress Notes (Addendum)
 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  Workup in ED included CT PE protocol which was negative for PE.  Head CT was obtained due to complaint of headache and showed bilateral acute/subacute subdural hematomas.  The case was discussed with Paul [name] who noted no need to transfer to [facility] (see 03/19/2024 note).  He was admitted to the ICU.  Neurosurgery was consulted who recommended supportive care.  Serial head CT's have shown stable subdural hematomas.  I discussed the case with Paul [name] 10/5 who recommends transfer to [facility] for further care.    Assessment & Plan:   Principal Problem:   Subdural hematoma (HCC)  Acute/Subacute Bilateral Subdural Hematomas CT with mixed density bilateral convexity extra axial hemorrhages with bilateral sulcal effacement and no significant midline shift on presentation 10/3 CT 10/4 unchanged bilateral subdurals  CT this AM, 10/5 with R>L unchanged bilateral subdural hematoms Neurosurgery recommending continuing supportive care, outpatient monitoring of SDH Keppra 500 mg BID for seizure prophylaxis  Platelets 15 today, transfuse and follow, goal >50,000   AML  Severe Thrombocytopenia  Anemia Follows with [facility] outpatient  Discussed with Paul [name] here who recommended transfuse to platelet goal >50,000 in setting of above.  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[facility] for continued care Discussed with Paul [name], who recommended transfer to [facility] as well  S/p 4 units platelets S/p 2 units pRBC Transfuse for Hb < 8 Hydroxyurea  voriconazole , valacyclovir , terbinafine   HFmrEF PACs  PVCs  Atrial Flutter with RVR Mexiletine, metoprolol   AKI on  CKD IIIa Improving, 1.76 on presentation  Baseline appears to be 1.2-1.3 Improving, trend Follow UA, consider renal US  if not improving as we'd expect  Hypokalemia  Hypomagnesemia  Replace and follow  Orthostatic Hypotension Noted, continue to monitor SBP dropped to 70's yesterday  T2DM SSI  BPH Flomax     DVT prophylaxis: SCD Code Status: full Family Communication: none Disposition:   Status is: Inpatient Remains inpatient appropriate because: need for ongoing inpatient care   Consultants:  neuro  Procedures:  none  Antimicrobials:  Anti-infectives (From admission, onward)    Start     Dose/Rate Route Frequency Ordered Stop   03/20/24 1100  terbinafine  (LAMISIL ) tablet 250 mg        250 mg Oral Daily 03/20/24 1000     03/20/24 1000  valACYclovir  (VALTREX ) tablet 500 mg        500 mg Oral Daily 03/19/24 1801     03/20/24 0800  cefUROXime  (CEFTIN ) tablet 500 mg  Status:  Discontinued        500 mg Oral 2 times daily with meals 03/19/24 1801 03/20/24 0913   03/19/24 2200  voriconazole  (VFEND ) tablet 200 mg        200 mg Oral 2 times daily 03/19/24 1801         Subjective:  No new complaints   Objective: Vitals:   03/21/24 1111 03/21/24 1143 03/21/24 1201 03/21/24 1302  BP: 101/63 99/63 106/75 107/72  Pulse: (!) 57 99 (!) 108 (!) 105  Resp: 16 18 20 20   Temp: 98.5 F (36.9 C) 98.6 F (37 C) 98 F (36.7 C) 98.3 F (36.8  C)  TempSrc: Oral Oral Oral Oral  SpO2: 99% 100% 98% 100%  Weight:      Height:        Intake/Output Summary (Last 24 hours) at 03/21/2024 1304 Last data filed at 03/21/2024 9287 Gross per 24 hour  Intake 240 ml  Output 1250 ml  Net -1010 ml   Filed Weights   03/19/24 1118 03/20/24 0424 03/21/24 0412  Weight: 80.4 kg 79.2 kg 82.8 kg    Examination:  General exam: Appears calm and comfortable  Respiratory system: ctab Cardiovascular system: tachycardia Gastrointestinal system: Abdomen is nondistended, soft and  nontender. Central nervous system: Alert, sleepy. No focal neurological deficits. Extremities: no lee   Data Reviewed: I have personally reviewed following labs and imaging studies  CBC: Recent Labs  Lab 03/19/24 1135 03/20/24 0427 03/21/24 0500  WBC 8.0 7.8 8.3  NEUTROABS 2.6  --  2.7  HGB 8.0* 9.1* 9.6*  HCT 24.4* 26.2* 27.6*  MCV 87.5 83.7 84.9  PLT <5* 59* 15*    Basic Metabolic Panel: Recent Labs  Lab 03/19/24 1135 03/19/24 2017 03/21/24 1015  NA 130*  --  134*  K 3.5  --  3.3*  CL 96*  --  99  CO2 20*  --  24  GLUCOSE 297*  --  124*  BUN 45*  --  30*  CREATININE 1.76*  --  1.66*  CALCIUM  7.7*  --  7.6*  MG  --  1.7 1.6*  PHOS  --   --  5.0*    GFR: Estimated Creatinine Clearance: 39.1 mL/min ([place]) (by C-G formula based on SCr of 1.66 mg/dL (H)).  Liver Function Tests: Recent Labs  Lab 03/19/24 1135  AST 12*  ALT 13  ALKPHOS 94  BILITOT 1.2  PROT 5.1*  ALBUMIN 2.3*    CBG: Recent Labs  Lab 03/20/24 2003 03/20/24 2349 03/21/24 0401 03/21/24 0805 03/21/24 1113  GLUCAP 189* 224* 154* 106* 173*     Recent Results (from the past 240 hours)  MRSA Next Gen by PCR, Nasal     Status: None   Collection Time: 03/19/24  7:31 PM   Specimen: Nasal Mucosa; Nasal Swab  Result Value Ref Range Status   MRSA by PCR Next Gen NOT DETECTED NOT DETECTED Final    Comment: (NOTE) The GeneXpert MRSA Assay (FDA approved for NASAL specimens only), is one component of [facility] comprehensive MRSA colonization surveillance program. It is not intended to diagnose MRSA infection nor to guide or monitor treatment for MRSA infections. Test performance is not FDA approved in patients less than 60 years old. Performed at [facility], [address] [place], [place] [address]          Radiology Studies: CT HEAD WO CONTRAST ( ) Result Date: 03/21/2024 EXAM: CT HEAD WITHOUT CONTRAST 03/21/2024 07:27:55 AM TECHNIQUE: CT of the head was performed without the  administration of intravenous contrast. Automated exposure control, iterative reconstruction, and/or weight based adjustment of the mA/kV was utilized to reduce the radiation dose to as low as reasonably achievable. COMPARISON: CT of the head dated 03/20/2024. CLINICAL HISTORY: Subdural hematoma. FINDINGS: BRAIN AND VENTRICLES: There are mixed attenuation subdural hematomas again demonstrated bilaterally, again more pronounced on the right, which are unchanged in size and appearance in the interim. No evidence of acute infarct. No hydrocephalus. No mass effect or midline shift. There is age-related cerebral atrophy. There is small-to-moderate periventricular white matter disease. There are calcifications within the carotid siphons. ORBITS: No  acute abnormality. SINUSES: There is mild-to-moderate mucosal disease within the paranasal sinuses. SOFT TISSUES AND SKULL: No acute soft tissue abnormality. No skull fracture. IMPRESSION: 1. Mixed attenuation bilateral subdural hematomas, right greater than left, unchanged in size and appearance since 03/20/24. 2. Age-related cerebral atrophy and small-to-moderate periventricular white matter disease. Electronically signed by: Evalene Coho MD 03/21/2024 07:40 AM EDT RP Workstation: [id number]   CT HEAD WO CONTRAST ( ) Result Date: 03/20/2024 EXAM: CT HEAD WITHOUT CONTRAST 03/20/2024 02:05:54 AM TECHNIQUE: CT of the head was performed without the administration of intravenous contrast. Automated exposure control, iterative reconstruction, and/or weight based adjustment of the mA/kV was utilized to reduce the radiation dose to as low as reasonably achievable. COMPARISON: 03/19/2024 CLINICAL HISTORY: Subdural hematoma. Subdural hematoma. FINDINGS: BRAIN AND VENTRICLES: Unchanged appearance of mixed density bilateral convexity subdural hematomas, 13 mm thick on the right and 8 mm on the left. No midline shift. No new site of hemorrhage. Chronic ischemic white matter  changes. No evidence of acute infarct. No hydrocephalus. No mass effect. ORBITS: No acute abnormality. SINUSES: Partial opacification of the right maxillary and bilateral ethmoid sinuses. SOFT TISSUES AND SKULL: No acute soft tissue abnormality. No skull fracture. IMPRESSION: 1. Unchanged mixed-density bilateral convexity subdural hematomas, 13 mm right and 8 mm left, without midline shift. 2. No new intracranial hemorrhage. Electronically signed by: Franky Stanford MD 03/20/2024 02:21 AM EDT RP Workstation: [id number]   CT Head Wo Contrast Result Date: 03/19/2024 EXAM: CT HEAD WITHOUT CONTRAST 03/19/2024 05:43:14 PM TECHNIQUE: CT of the head was performed without the administration of intravenous contrast. Automated exposure control, iterative reconstruction, and/or weight based adjustment of the mA/kV was utilized to reduce the radiation dose to as low as reasonably achievable. COMPARISON: None available. CLINICAL HISTORY: Headache, increasing frequency or severity. FINDINGS: BRAIN AND VENTRICLES: Shulamis Wenberg mixed density extra-axial hemorrhage over the right convexity measures up to 12 mm. Brownie Gockel less extensive extraaxial collection over the left convexity measures 7 mm. The effacement of the sulci is present bilaterally. No significant midline shift is present. Periventricular and subcortical white matter hypoattenuation is moderately advanced for age. This most likely reflects the sequelae of chronic microvascular ischemia. Atherosclerotic calcifications are present in the cavernous carotid arteries bilaterally and at the dural margin of both vertebral arteries. No hyperdense vessel is present. No evidence of acute infarct. No hydrocephalus. No mass effect. ORBITS: No acute abnormality. SINUSES: Chronic right maxillary sinus disease is present with wall thickening. Rachelann Enloe fluid level is present. Mild mucosal thickening is present in the sphenoid sinuses bilaterally. SOFT TISSUES AND SKULL: No acute soft tissue abnormality. No  skull fracture. IMPRESSION: 1. Mixed-density bilateral convexity extra-axial hemorrhages (right up to 12 mm, left up to 7 mm) with bilateral sulcal effacement and no significant midline shift. The collections are consistent with bilateral acute/subacute subdural hematomas. 2. Moderately advanced chronic microvascular ischemic changes. 3. Intracranial atherosclerosis. Critical value findings were called to Dr. Lenor at 5:57 pm. Electronically signed by: Lonni Necessary MD 03/19/2024 06:01 PM EDT RP Workstation: [id number]   CT Angio Chest PE W and/or Wo Contrast Result Date: 03/19/2024 CLINICAL DATA:  Shortness of breath for 2 weeks. EXAM: CT ANGIOGRAPHY CHEST WITH CONTRAST TECHNIQUE: Multidetector CT imaging of the chest was performed using the standard protocol during bolus administration of intravenous contrast. Multiplanar CT image reconstructions and MIPs were obtained to evaluate the vascular anatomy. RADIATION DOSE REDUCTION: This exam was performed according to the departmental dose-optimization program which includes automated exposure control, adjustment of the mA and/or  kV according to patient size and/or use of iterative reconstruction technique. CONTRAST:  75mL OMNIPAQUE  IOHEXOL  350 MG/ML SOLN COMPARISON:  June 02, 2014. FINDINGS: Cardiovascular: Satisfactory opacification of the pulmonary arteries to the segmental level. No evidence of pulmonary embolism. Normal heart size. No pericardial effusion. Coronary artery calcifications are noted. 4 cm ascending thoracic aortic aneurysm is noted. Aortic atherosclerosis is noted. Mediastinum/Nodes: No enlarged mediastinal, hilar, or axillary lymph nodes. Thyroid  gland, trachea, and esophagus demonstrate no significant findings. Lungs/Pleura: Minimal bilateral pleural effusions are noted. No pneumothorax is noted. Minimal bibasilar subsegmental atelectasis is noted. Upper Abdomen: No acute abnormality. Musculoskeletal: No chest wall abnormality. No  acute or significant osseous findings. Review of the MIP images confirms the above findings. IMPRESSION: 1. No definite evidence of pulmonary embolus. 2. Coronary artery calcifications are noted suggesting coronary artery disease. 3. Minimal bilateral pleural effusions are noted with minimal bibasilar subsegmental atelectasis. 4. 4 cm ascending thoracic aortic aneurysm. Recommend annual imaging followup by CTA or MRA. This recommendation follows 2010 ACCF/AHA/AATS/ACR/ASA/SCA/SCAI/SIR/STS/SVM Guidelines for the Diagnosis and Management of Patients with Thoracic Aortic Disease. Circulation. 2010; 121: Z733-z630. Aortic aneurysm NOS (ICD10-I71.9). Aortic Atherosclerosis (ICD10-I70.0). Electronically Signed   By: Lynwood Landy Raddle M.D.   On: 03/19/2024 16:03        Scheduled Meds:  Chlorhexidine  Gluconate Cloth  6 each Topical Daily   famotidine   20 mg Oral BID   feeding supplement  237 mL Oral BID BM   finasteride   5 mg Oral Daily   hydroxyurea   2,000 mg Oral BID   insulin  aspart  0-15 Units Subcutaneous Q4H   levETIRAcetam  500 mg Intravenous Q12H   LORazepam   1 mg Oral QHS   metoprolol  tartrate  25 mg Oral BID   mexiletine  200 mg Oral BID   potassium chloride  30 mEq Oral Q4H   rosuvastatin   20 mg Oral QHS   sodium chloride  flush  10-40 mL Intracatheter Q12H   tamsulosin   0.8 mg Oral QHS   terbinafine   250 mg Oral Daily   valACYclovir   500 mg Oral Daily   voriconazole   200 mg Oral BID   Continuous Infusions:  magnesium  sulfate bolus IVPB       LOS: 2 days    Time spent: over 30 min     Meliton Monte, MD [facility]   To contact the attending provider between 7A-7P or the covering provider during after hours 7P-7A, please log into the web site [email or web address] and access using universal [place] password for that web site. If you do not have the password, please call the hospital operator.  03/21/2024, 1:04 PM

## 2024-03-21 NOTE — Telephone Encounter (Signed)
 Transfer Center Request Note  Date and Time: March 21, 2024 2:13 PM  Requesting Physician: Perri Gals, MD   Requesting Hospital: [facility]   Requesting Service: Malignant hematology  Reason for Transfer: Continuity of care  Patient been to [facility] before? 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 Disease course c/b severe thrombocytopenia, recently admitted to [facility] for recent right SDH (dc 9/28).   He presented to [facility] health and was found to have expanding bilateral subdural hematomas on CT head.  Platelet count of 5, down from 30 on 10/1. No traumatic injury.   As of 10/5, he is transferred out of ICU to floor at OSH.   VS: 98.3, HR 105, 20, 107/72, SP 100% RA Per OSH provider, he is lethargic, follow instruction, but unable to hold conversation today. He was able to worked with PT 10/4, but had orthostatic hypotension.   10/4 s/p 4u PLT, 10/5 CBC PLT 59->15, HGB9.6, WBC 8.3 , PT/INR slightly elevated, DDimer 2.3  10/5 repeat CTH: R>L subdural hematoma, unchanged.  OSH Neurosurgery recommended OP follow, supportive care, no surgery.    Bed Type: Floor  Accepting Service:   Checklist for admission to 4[facility] from outside ED: SHOULD MEET THE FOLLOWING VITAL PARAMATERS: HR >60 and <120 yes  SBP >90 and <180 yes Respirations >8 and <25 yes O2 sat >90% on < or = to 40% or stable home O2 requirement yes  SHOULD NOT HAVE ANY OF THE FOLLOWING (SHOULD BE ALL BE NO ANSWERS): Concern for neurologic complications related to cancer or treatments no Acute AMS no Positive troponin no Concern for leukostasis no Severe sepsis/hemodynamic instability no Differential includes sepsis or leukostasis no Supportive nursing interventions at least every 2 hours no Drips, titratable meds or meds requiring frequent monitoring no Vitals/Assessments/Labs more than every 4  hours no Undiagnosed malignancy no Unclear which primary service would best serve the patient no  After discussing with Dr. Manley Dittus, covering malignant hematology attending, we agree with the transfer. However we will have to decline due to capacity.   Fellow Triaging Request: Babara Battles, MD

## 2024-03-21 NOTE — Progress Notes (Signed)
  NEUROSURGERY PROGRESS NOTE   No issues overnight. Pt reports improvement in HA with oxycodone  but may be too strong.  EXAM:  BP (!) 121/58 (BP Location: Left Arm)   Pulse 79   Temp 99.8 F (37.7 C) (Oral)   Resp 17   Ht 5' 10 (1.778 m)   Wt 82.8 kg   SpO2 93%   BMI 26.19 kg/m   Awake, alert Speech fluent, appropriate  CN grossly intact  5/5 BUE/BLE   IMPRESSION:  78 y.o. male , neurologically intact with small acute-on-chronic right convexity SDH.  PLAN: - Can cont supportive care, will need outpatient follow-up for monitoring of SDH.   Gerldine Maizes, MD [facility]

## 2024-03-22 ENCOUNTER — Inpatient Hospital Stay (HOSPITAL_COMMUNITY)

## 2024-03-22 DIAGNOSIS — D649 Anemia, unspecified: Secondary | ICD-10-CM

## 2024-03-22 DIAGNOSIS — S065XAA Traumatic subdural hemorrhage with loss of consciousness status unknown, initial encounter: Secondary | ICD-10-CM | POA: Diagnosis not present

## 2024-03-22 DIAGNOSIS — C9200 Acute myeloblastic leukemia, not having achieved remission: Secondary | ICD-10-CM | POA: Diagnosis not present

## 2024-03-22 DIAGNOSIS — D696 Thrombocytopenia, unspecified: Secondary | ICD-10-CM | POA: Diagnosis not present

## 2024-03-22 LAB — CBC WITH DIFFERENTIAL/PLATELET
Abs Immature Granulocytes: 0.7 K/uL — ABNORMAL HIGH (ref 0.00–0.07)
Basophils Absolute: 0 K/uL (ref 0.0–0.1)
Basophils Absolute: 0.2 K/uL — ABNORMAL HIGH (ref 0.0–0.1)
Basophils Relative: 0 %
Basophils Relative: 2 %
Blasts: 9 %
Eosinophils Absolute: 0 K/uL (ref 0.0–0.5)
Eosinophils Absolute: 0 K/uL (ref 0.0–0.5)
Eosinophils Relative: 0 %
Eosinophils Relative: 0 %
HCT: 26.2 % — ABNORMAL LOW (ref 39.0–52.0)
HCT: 28.2 % — ABNORMAL LOW (ref 39.0–52.0)
Hemoglobin: 8.9 g/dL — ABNORMAL LOW (ref 13.0–17.0)
Hemoglobin: 9.5 g/dL — ABNORMAL LOW (ref 13.0–17.0)
Lymphocytes Relative: 31 %
Lymphocytes Relative: 31 %
Lymphs Abs: 2.1 K/uL (ref 0.7–4.0)
Lymphs Abs: 2.4 K/uL (ref 0.7–4.0)
MCH: 29.4 pg (ref 26.0–34.0)
MCH: 29.6 pg (ref 26.0–34.0)
MCHC: 34 g/dL (ref 30.0–36.0)
MCHC: 33.7 g/dL (ref 30.0–36.0)
MCV: 86.5 fL (ref 80.0–100.0)
MCV: 87.9 fL (ref 80.0–100.0)
Metamyelocytes Relative: 3 %
Monocytes Absolute: 1.2 K/uL — ABNORMAL HIGH (ref 0.1–1.0)
Monocytes Absolute: 2.9 K/uL — ABNORMAL HIGH (ref 0.1–1.0)
Monocytes Relative: 18 %
Monocytes Relative: 38 %
Myelocytes: 7 %
Neutro Abs: 2.2 K/uL (ref 1.7–7.7)
Neutro Abs: 2.2 K/uL (ref 1.7–7.7)
Neutrophils Relative %: 32 %
Neutrophils Relative %: 29 %
Platelets: 44 K/uL — ABNORMAL LOW (ref 150–400)
Platelets: 28 K/uL — CL (ref 150–400)
RBC: 3.03 MIL/uL — ABNORMAL LOW (ref 4.22–5.81)
RBC: 3.21 MIL/uL — ABNORMAL LOW (ref 4.22–5.81)
RDW: 15.7 % — ABNORMAL HIGH (ref 11.5–15.5)
RDW: 15.8 % — ABNORMAL HIGH (ref 11.5–15.5)
WBC: 6.8 K/uL (ref 4.0–10.5)
WBC: 7.6 K/uL (ref 4.0–10.5)
nRBC: 0 % (ref 0.0–0.2)
nRBC: 0 % (ref 0.0–0.2)

## 2024-03-22 LAB — BLOOD GAS, VENOUS
Acid-Base Excess: 1.1 mmol/L (ref 0.0–2.0)
Bicarbonate: 25.1 mmol/L (ref 20.0–28.0)
Drawn by: 59174
O2 Saturation: 88.8 %
Patient temperature: 36.7
pCO2, Ven: 36 mmHg — ABNORMAL LOW (ref 44–60)
pH, Ven: 7.44 — ABNORMAL HIGH (ref 7.25–7.43)
pO2, Ven: 53 mmHg — ABNORMAL HIGH (ref 32–45)

## 2024-03-22 LAB — PREPARE PLATELET PHERESIS
Unit division: 0
Unit division: 0
Unit division: 0

## 2024-03-22 LAB — BPAM PLATELET PHERESIS
Blood Product Expiration Date: 202510062359
Blood Product Expiration Date: 202510072359
Blood Product Expiration Date: 202510072359
ISSUE DATE / TIME: 202510051132
ISSUE DATE / TIME: 202510052203
ISSUE DATE / TIME: 202510052346
Unit Type and Rh: 5100
Unit Type and Rh: 6200
Unit Type and Rh: 6200

## 2024-03-22 LAB — URINALYSIS, ROUTINE W REFLEX MICROSCOPIC
Bacteria, UA: NONE SEEN
Bilirubin Urine: NEGATIVE
Glucose, UA: >=500 mg/dL — AB
Ketones, ur: 5 mg/dL — AB
Leukocytes,Ua: NEGATIVE
Nitrite: NEGATIVE
Protein, ur: 30 mg/dL — AB
Specific Gravity, Urine: 1.016 (ref 1.005–1.030)
pH: 5 (ref 5.0–8.0)

## 2024-03-22 LAB — COMPREHENSIVE METABOLIC PANEL WITH GFR
ALT: 10 U/L (ref 0–44)
AST: 11 U/L — ABNORMAL LOW (ref 15–41)
Albumin: 2 g/dL — ABNORMAL LOW (ref 3.5–5.0)
Alkaline Phosphatase: 70 U/L (ref 38–126)
Anion gap: 10 (ref 5–15)
BUN: 33 mg/dL — ABNORMAL HIGH (ref 8–23)
CO2: 25 mmol/L (ref 22–32)
Calcium: 7.8 mg/dL — ABNORMAL LOW (ref 8.9–10.3)
Chloride: 100 mmol/L (ref 98–111)
Creatinine, Ser: 1.71 mg/dL — ABNORMAL HIGH (ref 0.61–1.24)
GFR, Estimated: 41 mL/min — ABNORMAL LOW (ref >60)
Glucose, Bld: 72 mg/dL (ref 70–99)
Potassium: 3.1 mmol/L — ABNORMAL LOW (ref 3.5–5.1)
Sodium: 135 mmol/L (ref 135–145)
Total Bilirubin: 0.9 mg/dL (ref 0.0–1.2)
Total Protein: 4.6 g/dL — ABNORMAL LOW (ref 6.5–8.1)

## 2024-03-22 LAB — AMMONIA: Ammonia: 15 umol/L (ref 9–35)

## 2024-03-22 LAB — GLUCOSE, CAPILLARY
Glucose-Capillary: 137 mg/dL — ABNORMAL HIGH (ref 70–99)
Glucose-Capillary: 164 mg/dL — ABNORMAL HIGH (ref 70–99)
Glucose-Capillary: 172 mg/dL — ABNORMAL HIGH (ref 70–99)
Glucose-Capillary: 223 mg/dL — ABNORMAL HIGH (ref 70–99)
Glucose-Capillary: 261 mg/dL — ABNORMAL HIGH (ref 70–99)
Glucose-Capillary: 81 mg/dL (ref 70–99)
Glucose-Capillary: 83 mg/dL (ref 70–99)

## 2024-03-22 LAB — MAGNESIUM: Magnesium: 2.2 mg/dL (ref 1.7–2.4)

## 2024-03-22 LAB — PHOSPHORUS: Phosphorus: 5.4 mg/dL — ABNORMAL HIGH (ref 2.5–4.6)

## 2024-03-22 LAB — FIBRINOGEN: Fibrinogen: 315 mg/dL (ref 210–475)

## 2024-03-22 MED ORDER — OXYCODONE HCL 5 MG PO TABS
2.5000 mg | ORAL_TABLET | Freq: Four times a day (QID) | ORAL | Status: DC | PRN
  Administered 2024-03-22 – 2024-03-25 (×2): 2.5 mg via ORAL
  Filled 2024-03-22 (×2): qty 1

## 2024-03-22 MED ORDER — SODIUM CHLORIDE 0.9% IV SOLUTION: Freq: Once | INTRAVENOUS | Status: AC

## 2024-03-22 MED ORDER — POTASSIUM CHLORIDE CRYS ER 20 MEQ PO TBCR: 40.0000 meq | EXTENDED_RELEASE_TABLET | ORAL | Status: DC

## 2024-03-22 MED ORDER — SODIUM CHLORIDE 0.9% IV SOLUTION
Freq: Once | INTRAVENOUS | Status: AC
Start: 2024-03-22 — End: 2024-03-22

## 2024-03-22 MED ORDER — POTASSIUM CHLORIDE CRYS ER 20 MEQ PO TBCR
40.0000 meq | EXTENDED_RELEASE_TABLET | ORAL | Status: AC
  Administered 2024-03-22 (×2): 40 meq via ORAL
  Filled 2024-03-22 (×2): qty 2

## 2024-03-22 MED ORDER — METOPROLOL TARTRATE 5 MG/5ML IV SOLN
2.5000 mg | INTRAVENOUS | Status: AC | PRN
  Administered 2024-03-27 (×3): 2.5 mg via INTRAVENOUS
  Filled 2024-03-22 (×2): qty 5

## 2024-03-22 NOTE — Progress Notes (Signed)

## 2024-03-22 NOTE — Progress Notes (Signed)
 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 male presenting 10/3 with progressive generalized weakness and shortness of breath over the past 2 weeks. Found to have acute on subacute bilateral subdural hematomas in the setting of his known severe thrombocytopenia. Admitted for neuro eval. PMH: DM, OSA, chronic systolic CHF with AICD placement, hypogonadism, HTN, stage 3 CKD, TKR bil, TIA, acute myeloid leukemia undergoing treatment at [facility], orthostatic hypotension, CAD with PCI    PT Comments  Pt sleeping upon arrival but arouses with gentle verbal and tactile stimulation. Pt presents with gross weakness (proximal > distal), impaired sitting/standing balance, decreased activity tolerance. PTA, pt had recently started using a RW in past 1-2 weeks,  otherwise was typically independent. Pt requiring min assist for bed mobility and transfers to standing. Unable to ambulate due to orthostatic hypotension and pt is symptomatic (see vitals flowsheet). Pt also with episode of bowel incontinence and rolled to R/L with increased time for peri care and pad change. Patient will benefit from intensive inpatient follow-up therapy, >3 hours/day.    If plan is discharge home, recommend the following: A little help with bathing/dressing/bathroom;Help with stairs or ramp for entrance;Assist for transportation;A lot of help with walking and/or transfers;Assistance with cooking/housework   Can travel by private vehicle        Equipment Recommendations  BSC/3in1;Wheelchair (measurements PT);Wheelchair cushion (measurements PT)    Recommendations for Other Services       Precautions / Restrictions Precautions Precautions: Fall;Other (comment) Recall of Precautions/Restrictions: Intact Precaution/Restrictions Comments: orthostatic, pressure injury on coccyx Restrictions Weight Bearing  Restrictions Per Provider Order: No     Mobility  Bed Mobility Overal bed mobility: Needs Assistance Bed Mobility: Supine to Sit, Sit to Supine     Supine to sit: Min assist Sit to supine: Contact guard assist   General bed mobility comments: MinA for trunk elevation to sit upright. Use of bed pad to scoot hips forward. Pt returning self to bed to symptomatic orthostatic hypotension    Transfers Overall transfer level: Needs assistance Equipment used: Rolling walker (2 wheels) Transfers: Sit to/from Stand Sit to Stand: Min assist, +2 safety/equipment           General transfer comment: MinA +2 safety to power up to standing. Tendency to keep eyes closed and have increased cervical flexion    Ambulation/Gait               General Gait Details: unable   Stairs             Wheelchair Mobility     Tilt Bed    Modified Rankin (Stroke Patients Only)       Balance Overall balance assessment: Needs assistance Sitting-balance support: No upper extremity supported, Feet supported Sitting balance-Leahy Scale: Fair     Standing balance support: Bilateral upper extremity supported Standing balance-Leahy Scale: Poor                              Communication Communication Communication: No apparent difficulties  Cognition Arousal: Alert Behavior During Therapy: Flat affect   PT - Cognitive impairments: Awareness                       PT - Cognition Comments: Not oriented to day of week Following commands: Impaired Following commands impaired: Follows one  step commands with increased time    Cueing Cueing Techniques: Verbal cues  Exercises General Exercises - Lower Extremity Long Arc Quad: AROM, Both, 10 reps, Seated Hip ABduction/ADduction: AROM, Both, 10 reps, Seated Hip Flexion/Marching: AROM, Both, 10 reps, Seated    General Comments        Pertinent Vitals/Pain Pain Assessment Pain Assessment: No/denies pain     Home Living                          Prior Function            PT Goals (current goals can now be found in the care plan section) Acute Rehab PT Goals Patient Stated Goal: to improve Potential to Achieve Goals: Fair Progress towards PT goals: Not progressing toward goals - comment    Frequency    Min 3X/week      PT Plan      Co-evaluation              AM-PAC PT 6 Clicks Mobility   Outcome Measure  Help needed turning from your back to your side while in a flat bed without using bedrails?: A Little Help needed moving from lying on your back to sitting on the side of a flat bed without using bedrails?: A Little Help needed moving to and from a bed to a chair (including a wheelchair)?: A Little Help needed standing up from a chair using your arms (e.g., wheelchair or bedside chair)?: A Little Help needed to walk in hospital room?: Total Help needed climbing 3-5 steps with a railing? : Total 6 Click Score: 14    End of Session Equipment Utilized During Treatment: Gait belt Activity Tolerance: Treatment limited secondary to medical complications (Comment) Patient left: in bed;with call bell/phone within reach;with bed alarm set;with family/visitor present Nurse Communication: Mobility status PT Visit Diagnosis: Unsteadiness on feet (R26.81);Other abnormalities of gait and mobility (R26.89);Muscle weakness (generalized) (M62.81);Difficulty in walking, not elsewhere classified (R26.2)     Time: 8592-8555 PT Time Calculation (min) (ACUTE ONLY): 37 min  Charges:    $Therapeutic Activity: 23-37 mins PT General Charges $$ ACUTE PT VISIT: 1 Visit                     [name], PT, DPT Acute Rehabilitation Services Office [phone]    [name] 03/22/2024, 3:40 PM

## 2024-03-22 NOTE — Progress Notes (Signed)
 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  Workup in ED included CT PE protocol which was negative for PE.  Head CT was obtained due to complaint of headache and showed bilateral acute/subacute subdural hematomas.  The case was discussed with Dr. Zewdu who noted no need to transfer to [facility] (see 03/19/2024 note).  He was admitted to the ICU.  Neurosurgery was consulted who recommended supportive care.  Serial head CT's have shown stable subdural hematomas.  I discussed the case with Dr. Zeidner 10/5 who recommends transfer to [facility] for further care.    Assessment & Plan:   Principal Problem:   Subdural hematoma (HCC) Active Problems:   Thrombocytopenia  Working on transfer to [facility] when able.  [facility] at capacity 10/5 and 10/6.  Will continue to follow with [facility].  Acute/Subacute Bilateral Subdural Hematomas CT with mixed density bilateral convexity extra axial hemorrhages with bilateral sulcal effacement and no significant midline shift on presentation 10/3 CT 10/4 unchanged bilateral subdurals  CT this AM, 10/5 with R>L unchanged bilateral subdural hematoms Neurosurgery recommending continuing supportive care, outpatient monitoring of SDH Keppra 500 mg BID for seizure prophylaxis  Transfuse platelets for goal >50,000   AML  Severe Thrombocytopenia  Anemia Follows with [facility] outpatient  Discussed with Dr. Onesimo here who recommended transfuse to platelet goal >50,000 in setting of above.  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 Carolan, who recommended transfer to [facility] as well  S/p 4 units platelets S/p 2 units pRBC Fibrinogen  wnl  Transfuse for  Hb < 8 Hydroxyurea  on hold for now --- resume once WBC over 10,000 voriconazole , valacyclovir , terbinafine   Acute Metabolic Encephalopathy Generalized Weakness Seems to be improving, related to acute hospitalization and pain meds - was lethargic yesterday, still very fatigued, tired today, but improved Delirium precautions  Fever Overnight, he's not neutropenic Follow blood cultrues UA not c/w UTI CXR without acute abnormality Will monitor   HFmrEF PACs  PVCs  Atrial Flutter with RVR Mexiletine, metoprolol  IV metop prn sustained HR >120  Rates in the 100s-110's  AKI on CKD IIIa Improving, 1.76 on presentation  Baseline appears to be 1.2-1.3 Improving, trend Follow UA, consider renal US  if not improving as we'd expect  Hypokalemia  Hypomagnesemia  Replace and follow  Orthostatic Hypotension Noted, continue to monitor SBP dropped to 70's earlier - follow closely  T2DM SSI  BPH Flomax     DVT prophylaxis: SCD Code Status: full Family Communication: none Disposition:   Status is: Inpatient Remains inpatient appropriate because: need for ongoing inpatient care   Consultants:  neuro  Procedures:  none  Antimicrobials:  Anti-infectives (From admission, onward)    Start     Dose/Rate Route Frequency Ordered Stop   03/20/24 1100  terbinafine  (LAMISIL ) tablet 250 mg        250 mg Oral Daily 03/20/24 1000     03/20/24 1000  valACYclovir  (VALTREX ) tablet 500 mg        500 mg Oral Daily 03/19/24 1801     03/20/24 0800  cefUROXime  (CEFTIN ) tablet 500 mg  Status:  Discontinued        500  mg Oral 2 times daily with meals 03/19/24 1801 03/20/24 0913   03/19/24 2200  voriconazole  (VFEND ) tablet 200 mg        200 mg Oral 2 times daily 03/19/24 1801         Subjective:  No new complaints    Objective: Vitals:   03/22/24 1137 03/22/24 1209 03/22/24 1219 03/22/24 1224  BP: 110/80 116/68  125/70  Pulse: (!) 102 (!) 102  63  Resp: 18 18  16   Temp:   99.4  F (37.4 C) 98 F (36.7 C)  TempSrc:   Oral Oral  SpO2: 98% 99%  97%  Weight:      Height:        Intake/Output Summary (Last 24 hours) at 03/22/2024 1304 Last data filed at 03/22/2024 1209 Gross per 24 hour  Intake 770.14 ml  Output 1550 ml  Net -779.86 ml   Filed Weights   03/20/24 0424 03/21/24 0412 03/22/24 0423  Weight: 79.2 kg 82.8 kg 82.4 kg    Examination:  General: No acute distress. Cardiovascular: tachy, freq PVCs Lungs: Clear to auscultation bilaterally Abdomen: Soft, nontender, nondistended Neurological: Alert, less lethargic today, still closes his eyes, seems tired during our conversation, but he's more alert Extremities: No clubbing or cyanosis. No edema.   Data Reviewed: I have personally reviewed following labs and imaging studies  CBC: Recent Labs  Lab 03/19/24 1135 03/20/24 0427 03/21/24 0500 03/21/24 1834 03/22/24 0515  WBC 8.0 7.8 8.3 7.6 6.8  NEUTROABS 2.6  --  2.7 1.7 2.2  HGB 8.0* 9.1* 9.6* 9.4* 8.9*  HCT 24.4* 26.2* 27.6* 27.9* 26.2*  MCV 87.5 83.7 84.9 86.4 86.5  PLT <5* 59* 15* 20* 44*    Basic Metabolic Panel: Recent Labs  Lab 03/19/24 1135 03/19/24 2017 03/21/24 1015 03/22/24 0515  NA 130*  --  134* 135  K 3.5  --  3.3* 3.1*  CL 96*  --  99 100  CO2 20*  --  24 25  GLUCOSE 297*  --  124* 72  BUN 45*  --  30* 33*  CREATININE 1.76*  --  1.66* 1.71*  CALCIUM  7.7*  --  7.6* 7.8*  MG  --  1.7 1.6* 2.2  PHOS  --   --  5.0* 5.4*    GFR: Estimated Creatinine Clearance: 37.9 mL/min (Bravlio Luca) (by C-G formula based on SCr of 1.71 mg/dL (H)).  Liver Function Tests: Recent Labs  Lab 03/19/24 1135 03/22/24 0515  AST 12* 11*  ALT 13 10  ALKPHOS 94 70  BILITOT 1.2 0.9  PROT 5.1* 4.6*  ALBUMIN 2.3* 2.0*    CBG: Recent Labs  Lab 03/21/24 2018 03/22/24 0021 03/22/24 0440 03/22/24 0808 03/22/24 1122  GLUCAP 259* 137* 81 83 223*     Recent Results (from the past 240 hours)  MRSA Next Gen by PCR, Nasal     Status: None    Collection Time: 03/19/24  7:31 PM   Specimen: Nasal Mucosa; Nasal Swab  Result Value Ref Range Status   MRSA by PCR Next Gen NOT DETECTED NOT DETECTED Final    Comment: (NOTE) The GeneXpert MRSA Assay (FDA approved for NASAL specimens only), is one component of Yaneli Keithley comprehensive MRSA colonization surveillance program. It is not intended to diagnose MRSA infection nor to guide or monitor treatment for MRSA infections. Test performance is not FDA approved in patients less than 72 years old. Performed at [facility], [address], [place], [place]  [address]          Radiology Studies: DG CHEST PORT 1 VIEW Result Date: 03/22/2024 CLINICAL DATA:  Weakness and shortness of breath. EXAM: PORTABLE CHEST 1 VIEW COMPARISON:  03/19/2024 FINDINGS: Stable right jugular porta catheter with its tip in the inferior aspect of the superior vena cava near the superior cavoatrial junction. Normal sized heart. Clear lungs with normal vascularity. Moderate right AC joint degenerative changes. IMPRESSION: No acute abnormality. Electronically Signed   By: Elspeth Bathe M.D.   On: 03/22/2024 12:21   CT HEAD WO CONTRAST ( ) Result Date: 03/21/2024 EXAM: CT HEAD WITHOUT CONTRAST 03/21/2024 07:27:55 AM TECHNIQUE: CT of the head was performed without the administration of intravenous contrast. Automated exposure control, iterative reconstruction, and/or weight based adjustment of the mA/kV was utilized to reduce the radiation dose to as low as reasonably achievable. COMPARISON: CT of the head dated 03/20/2024. CLINICAL HISTORY: Subdural hematoma. FINDINGS: BRAIN AND VENTRICLES: There are mixed attenuation subdural hematomas again demonstrated bilaterally, again more pronounced on the right, which are unchanged in size and appearance in the interim. No evidence of acute infarct. No hydrocephalus. No mass effect or midline shift. There is age-related cerebral atrophy. There is small-to-moderate periventricular  white matter disease. There are calcifications within the carotid siphons. ORBITS: No acute abnormality. SINUSES: There is mild-to-moderate mucosal disease within the paranasal sinuses. SOFT TISSUES AND SKULL: No acute soft tissue abnormality. No skull fracture. IMPRESSION: 1. Mixed attenuation bilateral subdural hematomas, right greater than left, unchanged in size and appearance since 03/20/24. 2. Age-related cerebral atrophy and small-to-moderate periventricular white matter disease. Electronically signed by: Timothy Berrigan MD 03/21/2024 07:40 AM EDT RP Workstation: [id number]        Scheduled Meds:  Chlorhexidine  Gluconate Cloth  6 each Topical Daily   famotidine   20 mg Oral BID   feeding supplement  237 mL Oral BID BM   finasteride   5 mg Oral Daily   insulin  aspart  0-15 Units Subcutaneous Q4H   levETIRAcetam  500 mg Intravenous Q12H   LORazepam   1 mg Oral QHS   metoprolol  tartrate  25 mg Oral BID   mexiletine  200 mg Oral BID   potassium chloride  40 mEq Oral Q4H   rosuvastatin   20 mg Oral QHS   sodium chloride  flush  10-40 mL Intracatheter Q12H   tamsulosin   0.8 mg Oral QHS   terbinafine   250 mg Oral Daily   valACYclovir   500 mg Oral Daily   voriconazole   200 mg Oral BID   Continuous Infusions:     LOS: 3 days    Time spent: over 30 min     Meliton Monte, MD [facility]   To contact the attending provider between 7A-7P or the covering provider during after hours 7P-7A, please log into the web site [email or web address] and access using universal [place] password for that web site. If you do not have the password, please call the hospital operator.  03/22/2024, 1:04 PM

## 2024-03-22 NOTE — Plan of Care (Signed)

## 2024-03-22 NOTE — Progress Notes (Signed)
 Paul GORMAN Piety, MD   DOB:Sep 12, 1945   FM#:[id number]      ASSESSMENT & PLAN:  Paul [name]. Paul [name] is a 78 year old male patient with oncologic history significant for AML, being treated at [facility]. Admitted on 03/19/24 for weakness and shortness of breath. Medical Oncology following closely.    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 - On second line AML treatment under clinical trial at [facility].  This has resulted in significant cytopenias, progressive fatigue and weakness, mental status changes with balance issues. - Worsening performance status with increasing transfusion requirements. - Hydroxyurea  is currently on hold.  Recommend restarting if-WBC greater than 10-12. - Patient is pending transfer to [facility] leukemia service.  Recommend virtual immediate transfer. - Medical oncology/Dr. Onesimo following closely.   Thrombocytopenia - Secondary to malignancy and oncologic therapy - Platelets show some improvement to 44K today - Recommend platelet transfusion for counts <50 K in context of subdural hematoma progression.  Anemia - Secondary to malignancy and oncologic therapy - Hemoglobin 8.9 today - Recommend PRBC transfusion for hemoglobin <7.5 - Continue to monitor CBC with differential  Renal dysfunction - Avoid nephrotoxic agents - Continue to monitor renal function   Bilateral subdural hematomas - Neurosurgery following.  No surgical intervention planned at this time.     Weakness - Continue falls precautions - Continue supportive care     Code Status Full   Subjective:  Patient seen awake and alert laying in bed. Family at bedside.  Working with PT team.  Appears weak however very pleasant.  Aware that waiting on [facility] for bed so he can transfer there.  No complaints offered.  Verbalizes appreciation for all the care received  here.  Objective:   Intake/Output Summary (Last 24 hours) at 03/22/2024 1325 Last data filed at 03/22/2024 1209 Gross per 24 hour  Intake 770.14 ml  Output 1550 ml  Net -779.86 ml     PHYSICAL EXAMINATION: ECOG PERFORMANCE STATUS: 3 - Symptomatic, >50% confined to bed  Vitals:   03/22/24 1219 03/22/24 1224  BP:  125/70  Pulse:  63  Resp:  16  Temp: 99.4 F (37.4 C) 98 F (36.7 C)  SpO2:  97%   Filed Weights   03/20/24 0424 03/21/24 0412 03/22/24 0423  Weight: 174 lb 9.7 oz (79.2 kg) 182 lb 8.7 oz (82.8 kg) 181 lb 10.5 oz (82.4 kg)    GENERAL: alert, no distress and comfortable + chronically ill-appearing SKIN: + Pale skin color, texture, turgor are normal, no rashes or significant lesions EYES: normal, conjunctiva are pink and non-injected, sclera clear OROPHARYNX: no exudate, no erythema and lips, buccal mucosa, and tongue normal  NECK: supple, thyroid  normal size, non-tender, without nodularity LYMPH: no palpable lymphadenopathy in the cervical, axillary or inguinal LUNGS: clear to auscultation and percussion with normal breathing effort HEART: regular rate & rhythm and no murmurs and + bilateral lower extremity edema 2+ ABDOMEN: abdomen soft, non-tender and normal bowel sounds MUSCULOSKELETAL: no cyanosis of digits and no clubbing  PSYCH: alert & oriented x 3 with fluent speech NEURO: no focal motor/sensory deficits   All questions were answered. The patient knows to call the clinic with any problems, questions or concerns.   The total time spent in the appointment was 40 minutes encounter with patient including review of chart and various tests results, discussions about plan  of care and coordination of care plan  Olam JINNY Brunner, NP 03/22/2024 1:25 PM    Labs Reviewed:  Lab Results  Component Value Date   WBC 6.8 03/22/2024   HGB 8.9 (L) 03/22/2024   HCT 26.2 (L) 03/22/2024   MCV 86.5 03/22/2024   PLT 44 (L) 03/22/2024   Recent Labs    03/10/24 0752  03/19/24 1135 03/21/24 1015 03/22/24 0515  NA 133* 130* 134* 135  K 3.6 3.5 3.3* 3.1*  CL 97* 96* 99 100  CO2 23 20* 24 25  GLUCOSE 164* 297* 124* 72  BUN 42* 45* 30* 33*  CREATININE 1.81* 1.76* 1.66* 1.71*  CALCIUM  8.0* 7.7* 7.6* 7.8*  GFRNONAA 38* 40* 42* 41*  PROT 4.9* 5.1*  --  4.6*  ALBUMIN 1.9* 2.3*  --  2.0*  AST 29 12*  --  11*  ALT 33 13  --  10  ALKPHOS 327* 94  --  70  BILITOT 1.4* 1.2  --  0.9    Studies Reviewed:  DG CHEST PORT 1 VIEW Result Date: 03/22/2024 CLINICAL DATA:  Weakness and shortness of breath. EXAM: PORTABLE CHEST 1 VIEW COMPARISON:  03/19/2024 FINDINGS: Stable right jugular porta catheter with its tip in the inferior aspect of the superior vena cava near the superior cavoatrial junction. Normal sized heart. Clear lungs with normal vascularity. Moderate right AC joint degenerative changes. IMPRESSION: No acute abnormality. Electronically Signed   By: Elspeth Bathe M.D.   On: 03/22/2024 12:21   CT HEAD WO CONTRAST ( ) Result Date: 03/21/2024 EXAM: CT HEAD WITHOUT CONTRAST 03/21/2024 07:27:55 AM TECHNIQUE: CT of the head was performed without the administration of intravenous contrast. Automated exposure control, iterative reconstruction, and/or weight based adjustment of the mA/kV was utilized to reduce the radiation dose to as low as reasonably achievable. COMPARISON: CT of the head dated 03/20/2024. CLINICAL HISTORY: Subdural hematoma. FINDINGS: BRAIN AND VENTRICLES: There are mixed attenuation subdural hematomas again demonstrated bilaterally, again more pronounced on the right, which are unchanged in size and appearance in the interim. No evidence of acute infarct. No hydrocephalus. No mass effect or midline shift. There is age-related cerebral atrophy. There is small-to-moderate periventricular white matter disease. There are calcifications within the carotid siphons. ORBITS: No acute abnormality. SINUSES: There is mild-to-moderate mucosal disease within the  paranasal sinuses. SOFT TISSUES AND SKULL: No acute soft tissue abnormality. No skull fracture. IMPRESSION: 1. Mixed attenuation bilateral subdural hematomas, right greater than left, unchanged in size and appearance since 03/20/24. 2. Age-related cerebral atrophy and small-to-moderate periventricular white matter disease. Electronically signed by: Evalene Coho MD 03/21/2024 07:40 AM EDT RP Workstation: [id number]   CT HEAD WO CONTRAST ( ) Result Date: 03/20/2024 EXAM: CT HEAD WITHOUT CONTRAST 03/20/2024 02:05:54 AM TECHNIQUE: CT of the head was performed without the administration of intravenous contrast. Automated exposure control, iterative reconstruction, and/or weight based adjustment of the mA/kV was utilized to reduce the radiation dose to as low as reasonably achievable. COMPARISON: 03/19/2024 CLINICAL HISTORY: Subdural hematoma. Subdural hematoma. FINDINGS: BRAIN AND VENTRICLES: Unchanged appearance of mixed density bilateral convexity subdural hematomas, 13 mm thick on the right and 8 mm on the left. No midline shift. No new site of hemorrhage. Chronic ischemic white matter changes. No evidence of acute infarct. No hydrocephalus. No mass effect. ORBITS: No acute abnormality. SINUSES: Partial opacification of the right maxillary and bilateral ethmoid sinuses. SOFT TISSUES AND SKULL: No acute soft tissue abnormality. No skull fracture. IMPRESSION: 1. Unchanged mixed-density bilateral convexity subdural  hematomas, 13 mm right and 8 mm left, without midline shift. 2. No new intracranial hemorrhage. Electronically signed by: Franky Stanford MD 03/20/2024 02:21 AM EDT RP Workstation: [id number]   CT Head Wo Contrast Result Date: 03/19/2024 EXAM: CT HEAD WITHOUT CONTRAST 03/19/2024 05:43:14 PM TECHNIQUE: CT of the head was performed without the administration of intravenous contrast. Automated exposure control, iterative reconstruction, and/or weight based adjustment of the mA/kV was utilized to reduce  the radiation dose to as low as reasonably achievable. COMPARISON: None available. CLINICAL HISTORY: Headache, increasing frequency or severity. FINDINGS: BRAIN AND VENTRICLES: A mixed density extra-axial hemorrhage over the right convexity measures up to 12 mm. A less extensive extraaxial collection over the left convexity measures 7 mm. The effacement of the sulci is present bilaterally. No significant midline shift is present. Periventricular and subcortical white matter hypoattenuation is moderately advanced for age. This most likely reflects the sequelae of chronic microvascular ischemia. Atherosclerotic calcifications are present in the cavernous carotid arteries bilaterally and at the dural margin of both vertebral arteries. No hyperdense vessel is present. No evidence of acute infarct. No hydrocephalus. No mass effect. ORBITS: No acute abnormality. SINUSES: Chronic right maxillary sinus disease is present with wall thickening. A fluid level is present. Mild mucosal thickening is present in the sphenoid sinuses bilaterally. SOFT TISSUES AND SKULL: No acute soft tissue abnormality. No skull fracture. IMPRESSION: 1. Mixed-density bilateral convexity extra-axial hemorrhages (right up to 12 mm, left up to 7 mm) with bilateral sulcal effacement and no significant midline shift. The collections are consistent with bilateral acute/subacute subdural hematomas. 2. Moderately advanced chronic microvascular ischemic changes. 3. Intracranial atherosclerosis. Critical value findings were called to Dr. Lenor at 5:57 pm. Electronically signed by: Lonni Necessary MD 03/19/2024 06:01 PM EDT RP Workstation: [id number]   CT Angio Chest PE W and/or Wo Contrast Result Date: 03/19/2024 CLINICAL DATA:  Shortness of breath for 2 weeks. EXAM: CT ANGIOGRAPHY CHEST WITH CONTRAST TECHNIQUE: Multidetector CT imaging of the chest was performed using the standard protocol during bolus administration of intravenous contrast.  Multiplanar CT image reconstructions and MIPs were obtained to evaluate the vascular anatomy. RADIATION DOSE REDUCTION: This exam was performed according to the departmental dose-optimization program which includes automated exposure control, adjustment of the mA and/or kV according to patient size and/or use of iterative reconstruction technique. CONTRAST:  75mL OMNIPAQUE  IOHEXOL  350 MG/ML SOLN COMPARISON:  June 02, 2014. FINDINGS: Cardiovascular: Satisfactory opacification of the pulmonary arteries to the segmental level. No evidence of pulmonary embolism. Normal heart size. No pericardial effusion. Coronary artery calcifications are noted. 4 cm ascending thoracic aortic aneurysm is noted. Aortic atherosclerosis is noted. Mediastinum/Nodes: No enlarged mediastinal, hilar, or axillary lymph nodes. Thyroid  gland, trachea, and esophagus demonstrate no significant findings. Lungs/Pleura: Minimal bilateral pleural effusions are noted. No pneumothorax is noted. Minimal bibasilar subsegmental atelectasis is noted. Upper Abdomen: No acute abnormality. Musculoskeletal: No chest wall abnormality. No acute or significant osseous findings. Review of the MIP images confirms the above findings. IMPRESSION: 1. No definite evidence of pulmonary embolus. 2. Coronary artery calcifications are noted suggesting coronary artery disease. 3. Minimal bilateral pleural effusions are noted with minimal bibasilar subsegmental atelectasis. 4. 4 cm ascending thoracic aortic aneurysm. Recommend annual imaging followup by CTA or MRA. This recommendation follows 2010 ACCF/AHA/AATS/ACR/ASA/SCA/SCAI/SIR/STS/SVM Guidelines for the Diagnosis and Management of Patients with Thoracic Aortic Disease. Circulation. 2010; 121: [id number]. Aortic aneurysm NOS (ICD10-I71.9). Aortic Atherosclerosis (ICD10-I70.0). Electronically Signed   By: Paul Landy Raddle M.D.   On: 03/19/2024  16:03   DG Chest Port 1 View Result Date: 03/19/2024 CLINICAL DATA:   Shortness of breath and weakness. EXAM: PORTABLE CHEST 1 VIEW COMPARISON:  03/06/2024 FINDINGS: Right IJ Port-A-Cath with tip over the SVC. Lungs are adequately inflated and otherwise clear. Cardiomediastinal silhouette and remainder of the exam is unchanged. IMPRESSION: No acute cardiopulmonary disease. Electronically Signed   By: Toribio Agreste M.D.   On: 03/19/2024 11:53   DG Chest Portable 1 View Result Date: 03/06/2024 CLINICAL DATA:  Shortness of breath EXAM: PORTABLE CHEST 1 VIEW COMPARISON:  05/05/2019 FINDINGS: Right Port-A-Cath in place with the tip in the right atrium. Heart and mediastinal contours are within normal limits. No focal opacities or effusions. No acute bony abnormality. IMPRESSION: No active disease. Electronically Signed   By: Franky Crease M.D.   On: 03/06/2024 23:17

## 2024-03-22 NOTE — TOC CM/SW Note (Signed)
 Transition of Care [facility]) - Inpatient Brief Assessment   Patient Details  Name: Paul BOWDEN, Paul [name] MRN: [id number] Date of Birth: 21-Mar-1946  Transition of Care [facility]) CM/SW Contact:    Andrez JULIANNA George, RN Phone Number: 03/22/2024, 1:37 PM   Clinical Narrative:  Working on transfer to [facility] when able. [facility] at capacity 10/5 and 10/6. Will continue to follow with [facility].  IP Care management following.  Transition of Care Asessment: Insurance and Status: Insurance coverage has been reviewed Patient has primary care physician: Yes Home environment has been reviewed: home with spouse   Prior/Current Home Services: No current home services Social Drivers of Health Review: SDOH reviewed no interventions necessary Readmission risk has been reviewed: Yes Transition of care needs: transition of care needs identified, TOC will continue to follow

## 2024-03-23 ENCOUNTER — Inpatient Hospital Stay (HOSPITAL_COMMUNITY)

## 2024-03-23 ENCOUNTER — Encounter (HOSPITAL_COMMUNITY): Payer: Self-pay | Admitting: Internal Medicine

## 2024-03-23 DIAGNOSIS — I2510 Atherosclerotic heart disease of native coronary artery without angina pectoris: Secondary | ICD-10-CM

## 2024-03-23 DIAGNOSIS — S065XAA Traumatic subdural hemorrhage with loss of consciousness status unknown, initial encounter: Secondary | ICD-10-CM | POA: Diagnosis not present

## 2024-03-23 DIAGNOSIS — I5020 Unspecified systolic (congestive) heart failure: Secondary | ICD-10-CM

## 2024-03-23 DIAGNOSIS — I4819 Other persistent atrial fibrillation: Secondary | ICD-10-CM | POA: Diagnosis not present

## 2024-03-23 LAB — BPAM PLATELET PHERESIS
Blood Product Expiration Date: 202510072359
Blood Product Expiration Date: 202510082359
ISSUE DATE / TIME: 202510061059
ISSUE DATE / TIME: 202510062252
Unit Type and Rh: 6200
Unit Type and Rh: 6200

## 2024-03-23 LAB — PREPARE PLATELET PHERESIS
Unit division: 0
Unit division: 0

## 2024-03-23 LAB — CBC
HCT: 27.1 % — ABNORMAL LOW (ref 39.0–52.0)
Hemoglobin: 9 g/dL — ABNORMAL LOW (ref 13.0–17.0)
MCH: 29.2 pg (ref 26.0–34.0)
MCHC: 33.2 g/dL (ref 30.0–36.0)
MCV: 88 fL (ref 80.0–100.0)
Platelets: 28 K/uL — CL (ref 150–400)
RBC: 3.08 MIL/uL — ABNORMAL LOW (ref 4.22–5.81)
RDW: 15.9 % — ABNORMAL HIGH (ref 11.5–15.5)
WBC: 8.2 K/uL (ref 4.0–10.5)
nRBC: 0 % (ref 0.0–0.2)

## 2024-03-23 LAB — GLUCOSE, CAPILLARY
Glucose-Capillary: 152 mg/dL — ABNORMAL HIGH (ref 70–99)
Glucose-Capillary: 164 mg/dL — ABNORMAL HIGH (ref 70–99)
Glucose-Capillary: 183 mg/dL — ABNORMAL HIGH (ref 70–99)
Glucose-Capillary: 189 mg/dL — ABNORMAL HIGH (ref 70–99)
Glucose-Capillary: 197 mg/dL — ABNORMAL HIGH (ref 70–99)
Glucose-Capillary: 298 mg/dL — ABNORMAL HIGH (ref 70–99)

## 2024-03-23 LAB — BASIC METABOLIC PANEL WITH GFR
Anion gap: 11 (ref 5–15)
BUN: 32 mg/dL — ABNORMAL HIGH (ref 8–23)
CO2: 23 mmol/L (ref 22–32)
Calcium: 7.9 mg/dL — ABNORMAL LOW (ref 8.9–10.3)
Chloride: 99 mmol/L (ref 98–111)
Creatinine, Ser: 1.72 mg/dL — ABNORMAL HIGH (ref 0.61–1.24)
GFR, Estimated: 41 mL/min — ABNORMAL LOW (ref >60)
Glucose, Bld: 193 mg/dL — ABNORMAL HIGH (ref 70–99)
Potassium: 4.2 mmol/L (ref 3.5–5.1)
Sodium: 133 mmol/L — ABNORMAL LOW (ref 135–145)

## 2024-03-23 LAB — PHOSPHORUS: Phosphorus: 4.5 mg/dL (ref 2.5–4.6)

## 2024-03-23 LAB — MAGNESIUM: Magnesium: 2 mg/dL (ref 1.7–2.4)

## 2024-03-23 MED ORDER — LACTATED RINGERS IV SOLN: INTRAVENOUS | Status: AC

## 2024-03-23 MED ORDER — NONFORMULARY OR COMPOUNDED ITEM
3.0000 | Status: DC
  Administered 2024-03-23 – 2024-03-27 (×9): 3 via ORAL
  Filled 2024-03-23 (×10): qty 1

## 2024-03-23 MED ORDER — LACTATED RINGERS IV BOLUS
500.0000 mL | Freq: Once | INTRAVENOUS | Status: AC
  Administered 2024-03-23: 500 mL via INTRAVENOUS

## 2024-03-23 MED ORDER — SODIUM CHLORIDE 0.9% IV SOLUTION: Freq: Once | INTRAVENOUS | Status: AC

## 2024-03-23 MED ORDER — NON FORMULARY: 3.0000 | Status: DC

## 2024-03-23 NOTE — Telephone Encounter (Signed)
 Transfer Center Request Note  Date and Time: March 21, 2024 2:13 PM  Requesting Physician: Perri Gals, MD   Requesting Hospital[facility]   Requesting Service: Malignant hematology  Reason for Transfer: Continuity of care  Patient been to [facility] before? yes  Brief Hospital Course:   Per 10/5 Dr. 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 Disease course c/b severe thrombocytopenia, recently admitted to [facility] for recent right SDH (dc 9/28).   He presented to [facility] health and was found to have expanding bilateral subdural hematomas on CT head.  Platelet count of 5, down from 30 on 10/1. No traumatic injury.   As of 10/5, he is transferred out of ICU to floor at OSH.   VS: 98.3, HR 105, 20, 107/72, SP 100% RA Per OSH provider, he is lethargic, follow instruction, but unable to hold conversation today. He was able to worked with PT 10/4, but had orthostatic hypotension.   10/4 s/p 4u PLT, 10/5 CBC PLT 59->15, HGB9.6, WBC 8.3 , PT/INR slightly elevated, DDimer 2.3  10/5 repeat CTH: R>L subdural hematoma, unchanged.  OSH Neurosurgery recommended OP follow, supportive care, no surgery.  After discussing with Dr. Manley Dittus, covering malignant hematology attending, we agree with the transfer. However we will have to decline due to capacity.   Interval Update 10/7 [facility] continues to not have bed capacity to accept Paul [name] for transfer at this time. Discussed with team at [facility] his recent updates. Patient remains on the floor, and primary team is encouraged that they may be able to facilitate discharge for Paul [name] in the near future, at which point they will seek to ensure he has prompt follow-up.   Fellow Triaging Request: Jerona LITTIE Abrahams, MD

## 2024-03-23 NOTE — Care Management Important Message (Signed)
 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

## 2024-03-23 NOTE — Plan of Care (Signed)
 Encourage food and fluid intake. To start taking trial medicine per MD. Sacral dressing changed as ordered.    Problem: Education: Goal: Knowledge of General Education information will improve Description: Including pain rating scale, medication(s)/side effects and non-pharmacologic comfort measures Outcome: Progressing   Problem: Activity: Goal: Risk for activity intolerance will decrease Outcome: Progressing   Problem: Nutrition: Goal: Adequate nutrition will be maintained Outcome: Progressing   Problem: Safety: Goal: Ability to remain free from injury will improve Outcome: Progressing   Problem: Skin Integrity: Goal: Risk for impaired skin integrity will decrease Outcome: Progressing

## 2024-03-23 NOTE — Progress Notes (Signed)
   Inpatient Rehabilitation Admissions Coordinator   Noted plans to transfer to [facility] for further treatment. I will hold on pursuing CIR.  Heron Leavell, RN, MSN Rehab Admissions Coordinator [phone] 03/23/2024 10:37 AM

## 2024-03-23 NOTE — Progress Notes (Addendum)
 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  Workup in ED included CT PE protocol which was negative for PE.  Head CT was obtained due to complaint of headache and showed bilateral acute/subacute subdural hematomas.  The case was discussed with Dr. Zewdu who noted no need to transfer to [facility] (see 03/19/2024 note).  He was admitted to the ICU.  Neurosurgery was consulted who recommended supportive care.  Serial head CT's have shown stable subdural hematomas.  I discussed the case with Dr. Zeidner 10/5 who recommends transfer to [facility] for further care.    No beds available at [facility].  Currently transfusing as needed for thrombocytopenia.  He's deconditioned, but more alert today than the prior days I've seen him.  Cardiology c/s today for RVR/orthostatic hypotension.    If unable to transfer to [facility], hopefully we can stabilize him and get him ready for discharge from here.  Difficult disposition though as he'll continue to need outpatient platelet transfusions and will need to have Awais Cobarrubias plan for this dependent on disposition (rehab vs home).   Assessment & Plan:   Principal Problem:   Subdural hematoma (HCC) Active Problems:   Thrombocytopenia   Anemia  Working on transfer [facility] when able.  [facility] at capacity 10/5, 10/6, and 10/7.  Will continue to follow with [facility].  Therapy recommending AIR.   Acute/Subacute Bilateral Subdural Hematomas CT with mixed density bilateral convexity extra axial hemorrhages with bilateral sulcal effacement and no significant midline shift on presentation 10/3 CT 10/4 unchanged bilateral subdurals  CT this AM, 10/5 with R>L unchanged bilateral subdural  hematoms Neurosurgery recommending continuing supportive care, outpatient monitoring of SDH Keppra 500 mg BID for seizure prophylaxis  Transfuse platelets for goal >50,000   AML  Severe Thrombocytopenia  Anemia Follows with [facility] outpatient (see sign out for Dr. Zeidner's contact information as needed) Discussed with Dr. Onesimo here who recommended transfuse to platelet goal >50,000 in setting of above.  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 Zeidner, who recommended transfer to [facility] as well  S/p 8 units platelets (additional 2 units today) S/p 2 units pRBC Fibrinogen  wnl  Transfuse for Hb < 8 Hydroxyurea  on hold for now --- resume once WBC over 10,000 Wife received call yesterday, they recommended resuming his study drug voriconazole , valacyclovir , terbinafine  Note, his thrombocytopenia alone should not keep him inpatient.  Per my discussion with Dr. Zeidner, they can continue transfusions outpatient as needed for his thrombocytopenia (when medically appropriate, should pursue discharge as is reasonable).  At this time, his orthostatic hypotension, deconditioning/generalized weakness is one of the main limiting factors and would complicate his ability to follow up outpatient with oncology for intermittent platelet transfusions (current therapy recommendations are for AIR).  He's more alert today, but still generally weak.  Will continue supportive care for his other issues, if he does not improve, may need to consider discussion of hospice (per my discussion with Dr. Carolan - I brought this possibility with [name] today).    Acute Metabolic Encephalopathy Generalized Weakness VBG without hypercarbia Ammonia wnl  Seems to be improving, related to acute hospitalization and pain meds  Delirium precautions  Fever (10/5) he's not neutropenic Follow blood cultrues UA not c/w UTI CXR without acute abnormality Will monitor   HFmrEF PACs  PVCs   Atrial Flutter with RVR Mexiletine, metoprolol  IV metop prn sustained HR >120  Echo 02/2024 with EF 50-55% Rates in the 100s-110's Will ask for cardiology assistance with regards to his rate with BP limiting AV nodal blockers  AKI on CKD IIIa Improving, 1.76 on presentation  Baseline appears to be 1.2-1.3 (as recently as 10/1) Improving, trend Follow UA (0-5 RBC, 30 mg/dl protein Follow renal US  Gentle IVF  Hypokalemia  Hypomagnesemia  Replace and follow  Orthostatic Hypotension Noted, continue to monitor SBP dropped to 70's 10/6 - follow closely Compression stockings, abdominal binder, IVF (bolus+mivf)  T2DM SSI  BPH Flomax     DVT prophylaxis: SCD Code Status: full Family Communication: none Disposition:   Status is: Inpatient Remains inpatient appropriate because: need for ongoing inpatient care   Consultants:  neuro  Procedures:  none  Antimicrobials:  Anti-infectives (From admission, onward)    Start     Dose/Rate Route Frequency Ordered Stop   03/20/24 1100  terbinafine  (LAMISIL ) tablet 250 mg        250 mg Oral Daily 03/20/24 1000     03/20/24 1000  valACYclovir  (VALTREX ) tablet 500 mg        500 mg Oral Daily 03/19/24 1801     03/20/24 0800  cefUROXime  (CEFTIN ) tablet 500 mg  Status:  Discontinued        500 mg Oral 2 times daily with meals 03/19/24 1801 03/20/24 0913   03/19/24 2200  voriconazole  (VFEND ) tablet 200 mg        200 mg Oral 2 times daily 03/19/24 1801         Subjective:  Denies new complaint Wife asking about his study medicine   Objective: Vitals:   03/23/24 0422 03/23/24 0807 03/23/24 1135 03/23/24 1224  BP: 118/72 122/82 128/66 (!) 111/90  Pulse: 100 (!) 107 (!) 120 (!) 101  Resp: 18 16  19   Temp: 98.5 F (36.9 C) 98.4 F (36.9 C) 98.7 F (37.1 C) 98.8 F (37.1 C)  TempSrc: Axillary Oral Oral Oral  SpO2: 98% 99%  100%  Weight:      Height:        Intake/Output Summary (Last 24 hours) at 03/23/2024 1418 Last  data filed at 03/23/2024 0610 Gross per 24 hour  Intake 434 ml  Output 1500 ml  Net -1066 ml   Filed Weights   03/21/24 0412 03/22/24 0423 03/23/24 0400  Weight: 82.8 kg 82.4 kg 82.3 kg    Examination:  General: No acute distress. Cardiovascular: tachy, irregularly irregular Lungs: Clear to auscultation bilaterally Abdomen: Soft, nontender, nondistended Neurological: Alert and oriented 3 - more alert today, but still tired appearing. Moves all extremities 4 with equal strength. Cranial nerves II through XII grossly intact. Extremities: chronic bilateral LE edema   Data Reviewed: I have personally reviewed following labs and imaging studies  CBC: Recent Labs  Lab 03/19/24 1135 03/20/24 0427 03/21/24 0500 03/21/24 1834 03/22/24 0515 03/22/24 2007 03/23/24 0552  WBC 8.0   < > 8.3 7.6 6.8 7.6 8.2  NEUTROABS 2.6  --  2.7 1.7 2.2 2.2  --   HGB 8.0*   < > 9.6* 9.4* 8.9* 9.5* 9.0*  HCT 24.4*   < > 27.6* 27.9* 26.2* 28.2* 27.1*  MCV 87.5   < > 84.9 86.4 86.5 87.9 88.0  PLT <  5*   < > 15* 20* 44* 28* 28*   < > = values in this interval not displayed.    Basic Metabolic Panel: Recent Labs  Lab 03/19/24 1135 03/19/24 2017 03/21/24 1015 03/22/24 0515 03/23/24 0552  NA 130*  --  134* 135 133*  K 3.5  --  3.3* 3.1* 4.2  CL 96*  --  99 100 99  CO2 20*  --  24 25 23   GLUCOSE 297*  --  124* 72 193*  BUN 45*  --  30* 33* 32*  CREATININE 1.76*  --  1.66* 1.71* 1.72*  CALCIUM  7.7*  --  7.6* 7.8* 7.9*  MG  --  1.7 1.6* 2.2 2.0  PHOS  --   --  5.0* 5.4* 4.5    GFR: Estimated Creatinine Clearance: 37.7 mL/min (Alezander Dimaano) (by C-G formula based on SCr of 1.72 mg/dL (H)).  Liver Function Tests: Recent Labs  Lab 03/19/24 1135 03/22/24 0515  AST 12* 11*  ALT 13 10  ALKPHOS 94 70  BILITOT 1.2 0.9  PROT 5.1* 4.6*  ALBUMIN 2.3* 2.0*    CBG: Recent Labs  Lab 03/22/24 2044 03/22/24 2320 03/23/24 0325 03/23/24 0805 03/23/24 1101  GLUCAP 172* 164* 152* 189* 197*      Recent Results (from the past 240 hours)  MRSA Next Gen by PCR, Nasal     Status: None   Collection Time: 03/19/24  7:31 PM   Specimen: Nasal Mucosa; Nasal Swab  Result Value Ref Range Status   MRSA by PCR Next Gen NOT DETECTED NOT DETECTED Final    Comment: (NOTE) The GeneXpert MRSA Assay (FDA approved for NASAL specimens only), is one component of Nathaly Dawkins comprehensive MRSA colonization surveillance program. It is not intended to diagnose MRSA infection nor to guide or monitor treatment for MRSA infections. Test performance is not FDA approved in patients less than 38 years old. Performed at [facility], [address], [place], [place] [address]   Culture, blood (Routine X 2) w Reflex to ID Panel     Status: None (Preliminary result)   Collection Time: 03/22/24 11:57 AM   Specimen: BLOOD LEFT HAND  Result Value Ref Range Status   Specimen Description BLOOD LEFT HAND  Final   Special Requests   Final    BOTTLES DRAWN AEROBIC AND ANAEROBIC Blood Culture adequate volume   Culture   Final    NO GROWTH < 24 HOURS Performed at [facility], [address], [place], [place] [address]    Report Status PENDING  Incomplete  Culture, blood (Routine X 2) w Reflex to ID Panel     Status: None (Preliminary result)   Collection Time: 03/22/24 12:10 PM   Specimen: BLOOD LEFT HAND  Result Value Ref Range Status   Specimen Description BLOOD LEFT HAND  Final   Special Requests   Final    BOTTLES DRAWN AEROBIC AND ANAEROBIC Blood Culture adequate volume   Culture   Final    NO GROWTH < 24 HOURS Performed at [facility], [address] [place], [place] [address]    Report Status PENDING  Incomplete         Radiology Studies: DG CHEST PORT 1 VIEW Result Date: 03/22/2024 CLINICAL DATA:  Weakness and shortness of breath. EXAM: PORTABLE CHEST 1 VIEW COMPARISON:  03/19/2024 FINDINGS: Stable right jugular porta catheter with its tip in the inferior aspect of the superior  vena cava near the superior cavoatrial  junction. Normal sized heart. Clear lungs with normal vascularity. Moderate right AC joint degenerative changes. IMPRESSION: No acute abnormality. Electronically Signed   By: Elspeth Bathe M.D.   On: 03/22/2024 12:21        Scheduled Meds:  Chlorhexidine  Gluconate Cloth  6 each Topical Daily   [id number]  3 tablet Oral 2 times per day   famotidine   20 mg Oral BID   feeding supplement  237 mL Oral BID BM   finasteride   5 mg Oral Daily   insulin  aspart  0-15 Units Subcutaneous Q4H   levETIRAcetam  500 mg Intravenous Q12H   LORazepam   1 mg Oral QHS   metoprolol  tartrate  25 mg Oral BID   mexiletine  200 mg Oral BID   rosuvastatin   20 mg Oral QHS   sodium chloride  flush  10-40 mL Intracatheter Q12H   tamsulosin   0.8 mg Oral QHS   terbinafine   250 mg Oral Daily   valACYclovir   500 mg Oral Daily   voriconazole   200 mg Oral BID   Continuous Infusions:     LOS: 4 days    Time spent: over 30 min     Meliton Monte, MD [facility]   To contact the attending provider between 7A-7P or the covering provider during after hours 7P-7A, please log into the web site [email or web address] and access using universal [place] password for that web site. If you do not have the password, please call the hospital operator.  03/23/2024, 2:18 PM

## 2024-03-23 NOTE — Progress Notes (Signed)
 PT Cancellation Note  Patient Details Name: HOLLISTER WESSLER, MD MRN: [id number] DOB: 1946/02/26   Cancelled Treatment:    Reason Eval/Treat Not Completed: Other (comment) Pt TED hose and abdominal binder have not arrived; checked in with unit secretary.  Supine BP 102/64. Will follow up tomorrow.  [name], PT, DPT Acute Rehabilitation Services Office [phone]    [name] 03/23/2024, 3:09 PM

## 2024-03-23 NOTE — Progress Notes (Addendum)
 Occupational Therapy Treatment Patient Details Name: Paul DELANCEY, MD MRN: [id number] DOB: 1945-10-07 Today's Date: 03/23/2024   History of present illness Pt is a 78 y.o. male presenting 10/3 with progressive generalized weakness and shortness of breath over the past 2 weeks. Found to have acute on subacute bilateral subdural hematomas in the setting of his known severe thrombocytopenia. Admitted for neuro eval. PMH: DM, OSA, chronic systolic CHF with AICD placement, hypogonadism, HTN, stage 3 CKD, TKR bil, TIA, acute myeloid leukemia undergoing treatment at [facility], orthostatic hypotension, CAD with PCI   OT comments  Limited session this date.  Despite PT ordering B ted stockings and abdominal binder the previous day, none where available or in the room, MD requesting both donned for OOB.  PT did speak with unit sec to order once again.  Unable to progress patient to EOB without TED and binder due to soft BP, 102/64.  OT positioned patient in chair position to complete AA HEP to lower and upper extremities outlined below.  OT to continue efforts in the acute setting to address deficits and Patient will benefit from intensive inpatient follow-up therapy, >3 hours/day.      If plan is discharge home, recommend the following:  A lot of help with walking and/or transfers;Two people to help with walking and/or transfers;A lot of help with bathing/dressing/bathroom;Assistance with cooking/housework;Assist for transportation;Help with stairs or ramp for entrance   Equipment Recommendations  BSC/3in1    Recommendations for Other Services      Precautions / Restrictions Precautions Precautions: Fall;Other (comment) Precaution/Restrictions Comments: orthostatic, pressure injury on coccyx Restrictions Weight Bearing Restrictions Per Provider Order: No                Extremity/Trunk Assessment Upper Extremity Assessment Upper Extremity Assessment: Generalized weakness   Lower Extremity  Assessment Lower Extremity Assessment: Defer to PT evaluation   Cervical / Trunk Assessment Cervical / Trunk Assessment: Normal    Vision   Vision Assessment?: No apparent visual deficits   Perception Perception Perception: Not tested   Praxis Praxis Praxis: Not tested   Communication Communication Communication: Impaired Factors Affecting Communication: Reduced clarity of speech   Cognition Arousal: Lethargic Behavior During Therapy: Flat affect Cognition: Cognition impaired                               Following commands: Impaired Following commands impaired: Follows one step commands with increased time      Cueing   Cueing Techniques: Verbal cues  Exercises General Exercises - Upper Extremity Shoulder Flexion: AROM, Both, 10 reps Shoulder Extension: AROM, Both, 10 reps Elbow Flexion: AROM, Both, 10 reps Elbow Extension: AROM, Both, 10 reps Digit Composite Flexion: AROM, Both, 10 reps Composite Extension: AROM, Both, 10 reps General Exercises - Lower Extremity Ankle Circles/Pumps: AAROM, Both, 10 reps Short Arc Quad: AAROM, 10 reps, Supine Heel Slides: AAROM, Both, 10 reps, Supine Hip ABduction/ADduction: AROM, Both, 10 reps, Supine    Shoulder Instructions       General Comments      Pertinent Vitals/ Pain       Pain Assessment Faces Pain Scale: Hurts a little bit Pain Location: generalized Pain Descriptors / Indicators: Grimacing Pain Intervention(s): Monitored during session  Frequency  Min 2X/week        Progress Toward Goals  OT Goals(current goals can now be found in the care plan section)     Acute Rehab OT Goals OT Goal Formulation: With patient Time For Goal Achievement: 04/03/24 Potential to Achieve Goals: Good  Plan      Co-evaluation                 AM-PAC OT 6 Clicks Daily Activity     Outcome Measure   Help from  another person eating meals?: A Little Help from another person taking care of personal grooming?: A Little Help from another person toileting, which includes using toliet, bedpan, or urinal?: A Lot Help from another person bathing (including washing, rinsing, drying)?: A Lot Help from another person to put on and taking off regular upper body clothing?: A Little Help from another person to put on and taking off regular lower body clothing?: A Lot 6 Click Score: 15    End of Session Equipment Utilized During Treatment: Rolling walker (2 wheels);Gait belt  OT Visit Diagnosis: Unsteadiness on feet (R26.81);Muscle weakness (generalized) (M62.81);Other symptoms and signs involving cognitive function   Activity Tolerance Treatment limited secondary to medical complications (Comment)   Patient Left in bed;with call bell/phone within reach;with nursing/sitter in room   Nurse Communication Mobility status        Time: 8496-8481 OT Time Calculation (min): 15 min  Charges: OT General Charges $OT Visit: 1 Visit OT Treatments $Therapeutic Exercise: 8-22 mins  03/23/2024  RP, OTR/L  Acute Rehabilitation Services  Office:  [phone]   Charlie JONETTA Halsted 03/23/2024, 3:22 PM

## 2024-03-23 NOTE — Consult Note (Signed)
 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  History of Present Illness: Paul [name] has a very complex longstanding history. He recently retired as a Development worker, community in [place]. Per Paul [name] note: Patient has had a cardiomyopathy known since around 2011.  Cath at that time showed nonobstructive CAD but EF 25%.  He got a Medtronic ICD.  Initial workup was in [place], and he subsequently moved to [place].  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  He has been noted to have frequent PVCs, at one point up to 1/3 of beats.  VT ablation did not help much, but mexiletine seemed to cut back on PVC frequency.  He is no longer taking mexiletine. Paul [name] felt like his PVCs increased again.  He was seen at [facility] for possible VT ablation.  During that visit, his ICD was interrogated and PVCs were noted to be up to > 1000/hr with short runs NSVT.  Echo was repeated in 1/16 and showed EF down to 30-35%.  Also of note, patient had an untriggered DVT in 12/15 and was on Xarelto  for a time but is no longer taking it. He was diagnosed with OSA and is on CPAP.    He had VT mapping at [facility] in 4/16, mapped to inferior septum.  He had LHC the  next day, showing 90% distal LAD and 90% PDA.  He had DES to both lesions.  He was sent home on ASA 325, Plavix  75, and Xarelto  20.  Plan was to return 1 year after DES placement to have epicardial VT ablation (off Plavix ).     In 6/17, he had unsuccessful epicardial/endocardial VT ablation at [facility]. Suspect mid-myocardial focus.    In 1/21, he had left-sided weakness with TIA.  Event monitor showed about 2 hrs of atrial fibrillation so anticoagulation was started.  In 9/24, patient was diagnosed with myelodysplastic syndrome.  He had a Cardiolite done at an outside facility in 9/24 that showed EF 60%, no ischemia.  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  He has been started on chemotherapy with azacitidine , venetoclax, and tagraxofusp.  Echo in 2/25 at outside facility showed EF 60%, RV mildly dilated with normal function.    Cardiac PET in 7/25 showed small, mild reversible apical perfusion defect; EF was 21% by PET. Echo was repeated in 12/2023 to reassess EF showing EF 45-50% range with normal RV size and mildly decreased RV systolic function, IVC not dilated. Paul [name] noted that when Hgb drops <9 with chemotherapy, the patient develops significant exertional dyspnea. Paul [name]  had been on hold due to low platelet count. Outpatient monitor worn 12/2023 showed 580 NSVT, 3819 PSVT, 10.4% PVCs, min HR 50bpm prompting restart of mexiletine. He was seen by EP inpatient 02/2024 for  bradycardia with HR in the 30s prompting holding of his metoprolol  with resolution; mexiletine increased due to frequent PVCs. Of note, his ICD was previously extracted in 2020 after his LVEF had improved.  He was admitted 03/19/2024 with generalized weakness, SOB, and headache. He recalls having a syncopal episode carrying groceries in the driveway and summoned EMS. He had had several recent falls as well. No CP or palpitations. He was found to have acute/subacute bilateral subdural hematomas. He is also being managed for acute metabolic  encephalopathy, fever, AKI on CKD 3a, hypokalemia, anemia, thrombocytopenia. Per notes, neurosurgery is recommending continuing supportive care, outpatient monitoring of SDH. He is on Keppra for seizure prophylaxis. He is in the process of pending transfer to [facility] for continued care as he follows there for his AML. Cardiology is consulted for orthostatic hypotension and atrial flutter with elevated rate. Orthostatics obtained yesterday showed a drop in blood pressure from 107/63 to 76/65, with sitting blood pressure this afternoon of 111/64. His Toprol  25mg  BID was changed to Lopressor  on 10/5. (Toprol  25mg  BID was back on home med list per [facility]. He thinks the AHF clinic restarted this but the last note I see from our team was from September when EP stopped all AVN blocking agents.) He remains on Flomax  0.8mg  at bedtime. He has IVF ordered for this afternoon. Per notes he has been treated with 8u platelets and 2u PRBCs (Hgb 8 down from 11 on admission, plt currently 28k). Telemetry reveals PAF/PAFL interspersed with with what appears to be NSR with 1st degree AVB HR 70s. He currently denies any acute complaints.   Past Medical History:  Diagnosis Date   Aftercare following removal/replacement defibrillator    a. defibrillator removed 03/2019.   Arthritis    thumbs, neck, knees (06/01/2013)   Cardiomyopathy, dilated, nonischemic (HCC) 10/20/2012   Cataract    Chronic systolic heart failure (HCC)    CKD (chronic kidney disease), stage III (HCC)    Coronary artery disease    DVT (deep venous thrombosis) (HCC)    Erectile dysfunction    Fatty liver 2014   Frequent PVCs    GERD (gastroesophageal reflux disease)    Hearing aid worn    B/L   Hypertension    Hypogonadism male    Hypovitaminosis D    NICM (nonischemic cardiomyopathy) (HCC)    Sleep apnea    cpap   Type II diabetes mellitus (HCC)    Ventricular tachycardia (HCC)    observed during stress testing, subsequent VT on EPS   Wears  glasses     Past Surgical History:  Procedure Laterality Date   ABLATION  06-01-2013   PVC's ablated along basal inferoseptal RV by Paul Kelsie   APPENDECTOMY  1969   CARDIAC CATHETERIZATION  04/2010   CARDIAC DEFIBRILLATOR PLACEMENT  05/09/2010   MDT ICD implanted in [place] by Paul Junette   CATARACT EXTRACTION W/ INTRAOCULAR LENS  IMPLANT, BILATERAL Bilateral    ICD LEAD REMOVAL N/A 03/19/2019   Procedure: ICD LEAD AND GENERATOR REMOVAL;  Surgeon: Waddell Danelle ORN, MD;  Location: [facility] OR;  Service: Cardiovascular;  Laterality: N/A;   KNEE ARTHROSCOPY Left 1984   LUMBAR LAMINECTOMY  ~ 2004   TONSILLECTOMY AND ADENOIDECTOMY  1972   TOTAL KNEE ARTHROPLASTY Left 04/28/2017   TOTAL KNEE ARTHROPLASTY Left 04/28/2017   Procedure: TOTAL KNEE ARTHROPLASTY LEFT;  Surgeon: Yvone Rush, MD;  Location: [facility] OR;  Service: Orthopedics;  Laterality: Left;   TOTAL KNEE ARTHROPLASTY Right 05/07/2019  Procedure: TOTAL KNEE ARTHROPLASTY;  Surgeon: Yvone Rush, MD;  Location: [facility] ORS;  Service: Orthopedics;  Laterality: Right;   V-TACH ABLATION N/A 06/01/2013   Procedure: V-TACH ABLATION;  Surgeon: Lynwood JONETTA Rakers, MD;  Location: [facility] CATH LAB;  Service: Cardiovascular;  Laterality: N/A;   VASECTOMY     VENTRICULAR ABLATION SURGERY  06/01/2013     Home Medications:  Prior to Admission medications   Medication Sig Start Date End Date Taking? Authorizing Provider  acetaminophen  (TYLENOL ) 500 MG tablet Take 1,000 mg by mouth every 6 (six) hours as needed for mild pain, moderate pain or headache.    Yes [provider]  diphenoxylate -atropine  (LOMOTIL ) 2.5-0.025 MG tablet Take 2 tablets by mouth as needed for diarrhea or loose stools. 08/27/23  Yes [provider]  hydroxyurea  (HYDREA ) 500 MG capsule Take 2,000 mg by mouth 2 (two) times daily. 03/05/24 05/04/24 Yes [provider]  Investigational - Study Medication Take 300 mg by mouth 2 (two) times daily. Study name: Dulib [id number]  100mg  tablet Additional study details: Acute myeloid leukemia not having achieved remission  Take 3 tablets (300mg ) by mouth two time a day for 28 days, under fasted or fed conditions, with up to or 8oz of water .   Yes [provider]  JARDIANCE  25 MG TABS tablet TAKE 1 TABLET BY MOUTH EVERY DAY 03/02/24  Yes [name], MD  LORazepam  (ATIVAN ) 1 MG tablet Take 1 mg by mouth at bedtime. 02/02/24  Yes [provider]  magnesium  oxide (MAG-OX) 400 (240 Mg) MG tablet Take 1 tablet by mouth daily. 02/21/24  Yes [provider]  metFORMIN  (GLUCOPHAGE ) 500 MG tablet Take 1,000 mg by mouth 2 (two) times daily with a meal.   Yes [provider]  metoprolol  succinate (TOPROL -XL) 25 MG 24 hr tablet Take 25 mg by mouth in the morning and at bedtime.   Yes [provider]  mexiletine (MEXITIL ) 200 MG capsule Take 1 capsule (200 mg total) by mouth 2 (two) times daily. 03/10/24  Yes Odell Celinda Balo, MD  ondansetron  (ZOFRAN -ODT) 8 MG disintegrating tablet Take 8 mg by mouth 3 (three) times daily. 09/18/23  Yes [provider]  polyethylene glycol (MIRALAX  / GLYCOLAX ) 17 g packet Take 17 g by mouth daily as needed. 08/13/23  Yes [provider]  rosuvastatin  (CRESTOR ) 20 MG tablet Take 20 mg by mouth at bedtime.   Yes [provider]  spironolactone  (ALDACTONE ) 25 MG tablet Take 1 tablet (25 mg total) by mouth daily. 11/03/23  Yes Rolan Paul RAMAN, MD  tamsulosin  (FLOMAX ) 0.4 MG CAPS capsule Take 0.8 mg by mouth at bedtime.   Yes [provider]  terbinafine  (LAMISIL ) 250 MG tablet Take 250 mg by mouth daily. 02/14/24  Yes [provider]  TRESIBA FLEXTOUCH 100 UNIT/ML FlexTouch Pen Inject 10 Units into the skin daily. 02/21/24  Yes [provider]  valACYclovir  (VALTREX ) 500 MG tablet Take 500 mg by mouth daily.   Yes [provider]  voriconazole  (VFEND ) 200 MG tablet Take 200 mg by mouth 2 (two)  times daily. 02/09/24  Yes [provider]  furosemide  (LASIX ) 40 MG tablet Take 1 tablet (40 mg total) by mouth daily. 03/15/24   Odell Celinda Balo, MD    Scheduled Meds:  Chlorhexidine  Gluconate Cloth  6 each Topical Daily   [id number]  3 tablet Oral 2 times per day   famotidine   20 mg Oral BID   feeding supplement  237  mL Oral BID BM   finasteride   5 mg Oral Daily   insulin  aspart  0-15 Units Subcutaneous Q4H   levETIRAcetam  500 mg Intravenous Q12H   LORazepam   1 mg Oral QHS   metoprolol  tartrate  25 mg Oral BID   mexiletine  200 mg Oral BID   rosuvastatin   20 mg Oral QHS   sodium chloride  flush  10-40 mL Intracatheter Q12H   terbinafine   250 mg Oral Daily   valACYclovir   500 mg Oral Daily   voriconazole   200 mg Oral BID   Continuous Infusions:  lactated ringers      PRN Meds: docusate sodium , metoprolol  tartrate, ondansetron  (ZOFRAN ) IV, oxyCODONE , polyethylene glycol, sodium chloride  flush  Allergies:    Allergies  Allergen Reactions   Lisinopril Other (See Comments), Shortness Of Breath, Swelling and Anaphylaxis    Angioedema  Angioedema    Angioedema    Social History:   Social History   Socioeconomic History   Marital status: Married    Spouse name: Not on file   Number of children: Not on file   Years of education: Not on file   Highest education level: Not on file  Occupational History   Occupation: Occupational Medicine Physician  Tobacco Use   Smoking status: Never   Smokeless tobacco: Never  Vaping Use   Vaping status: Never Used  Substance and Sexual Activity   Alcohol  use: Yes    Comment: social   Drug use: No   Sexual activity: Yes  Other Topics Concern   Not on file  Social History Narrative   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    Moved here from [place] 1.5 years ago   Social Drivers of [employer]: Low Risk  (03/12/2024)   Received from  [facility]   Overall Financial Resource Strain (CARDIA)    How hard is it for you to pay for the very basics like food, housing, medical care, and heating?: Not very hard  Food Insecurity: No Food Insecurity (03/19/2024)   Hunger Vital Sign    Worried About Running Out of Food in the Last Year: Never true    Ran Out of Food in the Last Year: Never true  Transportation Needs: No Transportation Needs (03/19/2024)   PRAPARE - Administrator, Civil Service (Medical): No    Lack of Transportation (Non-Medical): No  Physical Activity: Inactive (01/23/2024)   [facility]   Exercise Vital Sign    On average, how many days per week do you engage in moderate to strenuous exercise (like a brisk walk)?: 0 days    Minutes of Exercise per Session: Not on file  Stress: No Stress Concern Present (01/23/2024)   Received from [facility] - Occupational Stress Questionnaire    Do you feel stress - tense, restless, nervous, or anxious, or unable to sleep at night because your mind is troubled all the time - these days?: Not at all  Social Connections: Socially Integrated (03/19/2024)   Social Connection and Isolation Panel    Frequency of Communication with Friends and Family: More than three times a week    Frequency of Social Gatherings with Friends and Family: More than three times a week    Attends Religious Services: More than 4 times per year    Active Member of [employer] or Organizations: Yes  Attends Banker Meetings: Never    Marital Status: Married  Catering manager Violence: Unknown (03/19/2024)   Humiliation, Afraid, Rape, and Kick questionnaire    Fear of Current or Ex-Partner: No    Emotionally Abused: No    Physically Abused: Not on file    Sexually Abused: No    Family History:    Family History  Problem Relation Age of Onset   Alcohol  abuse Mother    COPD Mother    Depression Mother    Heart disease  Mother    Hypertension Mother    Stroke Mother    COPD Father    Heart disease Father    Hypertension Father    Stroke Father    Cancer Father    Diabetes Sister    Mental retardation Sister    Learning disabilities Sister    Hypertension Sister    Alcohol  abuse Brother    Drug abuse Brother    Heart disease Brother    Hypertension Brother    Coronary artery disease Brother    Colon cancer Neg Hx    Pancreatic cancer Neg Hx    Esophageal cancer Neg Hx    Stomach cancer Neg Hx    Liver disease Neg Hx    Rectal cancer Neg Hx      ROS:  Please see the history of present illness.  All other ROS reviewed and negative.     Physical Exam/Data: Vitals:   03/23/24 1450 03/23/24 1505 03/23/24 1512 03/23/24 1720  BP: 102/64 111/64 111/64 119/67  Pulse: 74 77 77 (!) 102  Resp: 18 18    Temp: 99 F (37.2 C) 98.2 F (36.8 C) 98.2 F (36.8 C) 98.7 F (37.1 C)  TempSrc: Oral  Oral Oral  SpO2: 100%  99% 98%  Weight:      Height:        Intake/Output Summary (Last 24 hours) at 03/23/2024 1741 Last data filed at 03/23/2024 0610 Gross per 24 hour  Intake 434 ml  Output 1400 ml  Net -966 ml      03/23/2024    4:00 AM 03/22/2024    4:23 AM 03/21/2024    4:12 AM  Last 3 Weights  Weight (lbs) 181 lb 7 oz 181 lb 10.5 oz 182 lb 8.7 oz  Weight (kg) 82.3 kg 82.4 kg 82.8 kg     Body mass index is 26.03 kg/m.  General: Well developed in no acute distress. Weak appearing. Lying flat without ydspnea Head: Normocephalic, atraumatic, sclera non-icteric, no xanthomas, nares are without discharge. Neck: Negative for carotid bruits. JVP not elevated. Lungs: Clear bilaterally to auscultation without wheezes, rales, or rhonchi. Breathing is unlabored. Heart: RRR S1 S2 without murmurs, rubs, or gallops.  Abdomen: Soft, non-tender, non-distended with normoactive bowel sounds. No rebound/guarding. Extremities: No clubbing or cyanosis. No edema. Distal pedal pulses are 2+ and equal  bilaterally. Neuro: Alert and oriented X 3. Moves all extremities spontaneously. Psych:  Responds to questions appropriately with a normal affect.   EKG:  The EKG was personally reviewed and demonstrates:  atrial flutter 105bpm with RBBB. Repeat EKG around same time similar. Telemetry:  Telemetry was personally reviewed and demonstrates:  described above  Relevant CV Studies: 2d echo 12/2023   1. Left ventricular ejection fraction, by estimation, is 45 to 50%. The  left ventricle has mildly decreased function. The left ventricle  demonstrates global hypokinesis. There is mild left ventricular  hypertrophy. Indeterminate diastolic filling due to  E-A fusion.   2. Right ventricular systolic function is mildly reduced. The right  ventricular size is normal. There is normal pulmonary artery systolic  pressure. The estimated right ventricular systolic pressure is 29.4 mmHg.   3. The mitral valve is normal in structure. Trivial mitral valve  regurgitation. No evidence of mitral stenosis.   4. The aortic valve is tricuspid. There is mild thickening of the aortic  valve. Aortic valve regurgitation is trivial. No aortic stenosis is  present.   5. Aortic dilatation noted. There is mild dilatation of the ascending  aorta, measuring 41 mm.   6. The inferior vena cava is normal in size with greater than 50%  respiratory variability, suggesting right atrial pressure of 3 mmHg.   01/2024 monitor  Patch Wear Time:  13 days and 23 hours (2025-07-14T15:17:00-0400 to 2025-07-28T15:16:52-0400)   Patient had a min HR of 50 bpm, max HR of 235 bpm, and avg HR of 84 bpm. Predominant underlying rhythm was Sinus Rhythm. Bundle Branch Block/IVCD was present. 580 Ventricular Tachycardia runs occurred, the run with the fastest interval lasting 13 beats  with a max rate of 235 bpm, the longest lasting 13 beats with an avg rate of 102 bpm. 3819 Supraventricular Tachycardia runs occurred, the run with the fastest  interval lasting 9.8 secs with a max rate of 211 bpm, the longest lasting 38.9 secs with an  avg rate of 116 bpm. Ventricular Tachycardia, Supraventricular Tachycardia and Run of VEs were detected within +/- 45 seconds of symptomatic patient event(s). Isolated SVEs were frequent (6.8%, [id number]), SVE Couplets were occasional (1.2%, 9696), and SVE  Triplets were rare (<1.0%, 2581). Isolated VEs were frequent (8.7%, [id number]), VE Couplets were occasional (1.7%, 13505), and VE Triplets were rare (<1.0%, 2123). Ventricular Bigeminy and Trigeminy were present.   1. Predominantly NSR 2. 580 short NSVT runs, longest 13 beats 3. 3819 SVT runs, longest 38.9 seconds.  4. Frequent PVCs, 10.4% of beats.      Laboratory Data: High Sensitivity Troponin:   Recent Labs  Lab 03/06/24 2250 03/07/24 0033 03/19/24 1135 03/19/24 1319  TROPONINIHS 43* 46* 11 11     Chemistry Recent Labs  Lab 03/21/24 1015 03/22/24 0515 03/23/24 0552  NA 134* 135 133*  K 3.3* 3.1* 4.2  CL 99 100 99  CO2 24 25 23   GLUCOSE 124* 72 193*  BUN 30* 33* 32*  CREATININE 1.66* 1.71* 1.72*  CALCIUM  7.6* 7.8* 7.9*  MG 1.6* 2.2 2.0  GFRNONAA 42* 41* 41*  ANIONGAP 11 10 11     Recent Labs  Lab 03/19/24 1135 03/22/24 0515  PROT 5.1* 4.6*  ALBUMIN 2.3* 2.0*  AST 12* 11*  ALT 13 10  ALKPHOS 94 70  BILITOT 1.2 0.9   Lipids No results for input(s): CHOL, TRIG, HDL, LABVLDL, LDLCALC, CHOLHDL in the last 168 hours.  Hematology Recent Labs  Lab 03/22/24 0515 03/22/24 2007 03/23/24 0552  WBC 6.8 7.6 8.2  RBC 3.03* 3.21* 3.08*  HGB 8.9* 9.5* 9.0*  HCT 26.2* 28.2* 27.1*  MCV 86.5 87.9 88.0  MCH 29.4 29.6 29.2  MCHC 34.0 33.7 33.2  RDW 15.7* 15.8* 15.9*  PLT 44* 28* 28*   Thyroid  No results for input(s): TSH, FREET4 in the last 168 hours.  BNP Recent Labs  Lab 03/19/24 1135  BNP 259.0*    DDimer  Recent Labs  Lab 03/19/24 1319  DDIMER 2.37*    Radiology/Studies:  DG CHEST PORT 1  VIEW Result Date: 03/22/2024 CLINICAL DATA:  Weakness and shortness of breath. EXAM: PORTABLE CHEST 1 VIEW COMPARISON:  03/19/2024 FINDINGS: Stable right jugular porta catheter with its tip in the inferior aspect of the superior vena cava near the superior cavoatrial junction. Normal sized heart. Clear lungs with normal vascularity. Moderate right AC joint degenerative changes. IMPRESSION: No acute abnormality. Electronically Signed   By: Paul Bathe M.D.   On: 03/22/2024 12:21   CT HEAD WO CONTRAST ( ) Result Date: 03/21/2024 EXAM: CT HEAD WITHOUT CONTRAST 03/21/2024 07:27:55 AM TECHNIQUE: CT of the head was performed without the administration of intravenous contrast. Automated exposure control, iterative reconstruction, and/or weight based adjustment of the mA/kV was utilized to reduce the radiation dose to as low as reasonably achievable. COMPARISON: CT of the head dated 03/20/2024. CLINICAL HISTORY: Subdural hematoma. FINDINGS: BRAIN AND VENTRICLES: There are mixed attenuation subdural hematomas again demonstrated bilaterally, again more pronounced on the right, which are unchanged in size and appearance in the interim. No evidence of acute infarct. No hydrocephalus. No mass effect or midline shift. There is age-related cerebral atrophy. There is small-to-moderate periventricular white matter disease. There are calcifications within the carotid siphons. ORBITS: No acute abnormality. SINUSES: There is mild-to-moderate mucosal disease within the paranasal sinuses. SOFT TISSUES AND SKULL: No acute soft tissue abnormality. No skull fracture. IMPRESSION: 1. Mixed attenuation bilateral subdural hematomas, right greater than left, unchanged in size and appearance since 03/20/24. 2. Age-related cerebral atrophy and small-to-moderate periventricular white matter disease. Electronically signed by: Evalene Coho MD 03/21/2024 07:40 AM EDT RP Workstation: [id number]   CT HEAD WO CONTRAST ( ) Result Date:  03/20/2024 EXAM: CT HEAD WITHOUT CONTRAST 03/20/2024 02:05:54 AM TECHNIQUE: CT of the head was performed without the administration of intravenous contrast. Automated exposure control, iterative reconstruction, and/or weight based adjustment of the mA/kV was utilized to reduce the radiation dose to as low as reasonably achievable. COMPARISON: 03/19/2024 CLINICAL HISTORY: Subdural hematoma. Subdural hematoma. FINDINGS: BRAIN AND VENTRICLES: Unchanged appearance of mixed density bilateral convexity subdural hematomas, 13 mm thick on the right and 8 mm on the left. No midline shift. No new site of hemorrhage. Chronic ischemic white matter changes. No evidence of acute infarct. No hydrocephalus. No mass effect. ORBITS: No acute abnormality. SINUSES: Partial opacification of the right maxillary and bilateral ethmoid sinuses. SOFT TISSUES AND SKULL: No acute soft tissue abnormality. No skull fracture. IMPRESSION: 1. Unchanged mixed-density bilateral convexity subdural hematomas, 13 mm right and 8 mm left, without midline shift. 2. No new intracranial hemorrhage. Electronically signed by: Franky Stanford MD 03/20/2024 02:21 AM EDT RP Workstation: [id number]   CT Head Wo Contrast Result Date: 03/19/2024 EXAM: CT HEAD WITHOUT CONTRAST 03/19/2024 05:43:14 PM TECHNIQUE: CT of the head was performed without the administration of intravenous contrast. Automated exposure control, iterative reconstruction, and/or weight based adjustment of the mA/kV was utilized to reduce the radiation dose to as low as reasonably achievable. COMPARISON: None available. CLINICAL HISTORY: Headache, increasing frequency or severity. FINDINGS: BRAIN AND VENTRICLES: A mixed density extra-axial hemorrhage over the right convexity measures up to 12 mm. A less extensive extraaxial collection over the left convexity measures 7 mm. The effacement of the sulci is present bilaterally. No significant midline shift is present. Periventricular and subcortical  white matter hypoattenuation is moderately advanced for age. This most likely reflects the sequelae of chronic microvascular ischemia. Atherosclerotic calcifications are present in the cavernous carotid arteries bilaterally and at the dural margin of both vertebral arteries. No hyperdense vessel is present. No evidence of acute infarct. No  hydrocephalus. No mass effect. ORBITS: No acute abnormality. SINUSES: Chronic right maxillary sinus disease is present with wall thickening. A fluid level is present. Mild mucosal thickening is present in the sphenoid sinuses bilaterally. SOFT TISSUES AND SKULL: No acute soft tissue abnormality. No skull fracture. IMPRESSION: 1. Mixed-density bilateral convexity extra-axial hemorrhages (right up to 12 mm, left up to 7 mm) with bilateral sulcal effacement and no significant midline shift. The collections are consistent with bilateral acute/subacute subdural hematomas. 2. Moderately advanced chronic microvascular ischemic changes. 3. Intracranial atherosclerosis. Critical value findings were called to Paul. Lenor at 5:57 pm. Electronically signed by: Lonni Necessary MD 03/19/2024 06:01 PM EDT RP Workstation: [id number]     Assessment and Plan:  1. Very complex medical admission with weakness/SOB, syncope, bilateral subdural hematomas, acute metabolic encephalopathy, fever, AKI on CKD 3a, hypokalemia, anemia, thrombocytopenia  - per primary teams - awaiting tx to [facility] per notes  2. Atrial flutter with intermittent RVR, with history of PAF, RBBB, LAFB, also with notable history of bradycardia and frequent PVCs - not a candidate for anticoagulation due to SDH and platelet count - note in 02/2024 had significant bradycardia recommended to avoid AVN blocking agents going forward but ? Back on Toprol  prior to admission - pending review of telemetry/HR trends with MD  3. Orthostatic hypotension  - likely multifactorial, including low albumin, AML, anemia - will hold Flomax   and review with MD - recommend primary team consider alternatives to Flomax  if needed  4. Chronic HFmrEF with underlying CAD - no recent angina - volume status looks OK  Pending review of complex case with Paul. [place]  Risk Assessment/Risk Scores:       [place]  Heart Association (NYHA) Functional Class NYHA Class II  CHA2DS2-VASc Score = 7   This indicates a 11.2% annual risk of stroke. The patient's score is based upon: CHF History: 1 HTN History: 1 Diabetes History: 1 Stroke History: 2 Vascular Disease History: 0 Age Score: 2 Gender Score: 0      For questions or updates, please contact [facility] Please consult [email or web address] for contact info under      Signed, Sindy Mccune N Gwendolyne Welford, PA-C  03/23/2024 5:41 PM

## 2024-03-23 NOTE — Plan of Care (Signed)
  Problem: Health Behavior/Discharge Planning: Goal: Ability to manage health-related needs will improve Outcome: Progressing   Problem: Clinical Measurements: Goal: Ability to maintain clinical measurements within normal limits will improve Outcome: Progressing   Problem: Clinical Measurements: Goal: Will remain free from infection Outcome: Progressing   Problem: Clinical Measurements: Goal: Respiratory complications will improve Outcome: Progressing   Problem: Activity: Goal: Risk for activity intolerance will decrease Outcome: Progressing   Problem: Coping: Goal: Level of anxiety will decrease Outcome: Progressing   Problem: Elimination: Goal: Will not experience complications related to bowel motility Outcome: Progressing   Problem: Safety: Goal: Ability to remain free from injury will improve Outcome: Progressing   Problem: Skin Integrity: Goal: Risk for impaired skin integrity will decrease Outcome: Progressing

## 2024-03-24 DIAGNOSIS — S065XAA Traumatic subdural hemorrhage with loss of consciousness status unknown, initial encounter: Secondary | ICD-10-CM | POA: Diagnosis not present

## 2024-03-24 DIAGNOSIS — C9200 Acute myeloblastic leukemia, not having achieved remission: Secondary | ICD-10-CM | POA: Diagnosis not present

## 2024-03-24 DIAGNOSIS — D696 Thrombocytopenia, unspecified: Secondary | ICD-10-CM | POA: Diagnosis not present

## 2024-03-24 LAB — CBC WITH DIFFERENTIAL/PLATELET
Abs Immature Granulocytes: 1.84 K/uL — ABNORMAL HIGH (ref 0.00–0.07)
Abs Immature Granulocytes: 2.05 K/uL — ABNORMAL HIGH (ref 0.00–0.07)
Basophils Absolute: 0 K/uL (ref 0.0–0.1)
Basophils Absolute: 0 K/uL (ref 0.0–0.1)
Basophils Relative: 0 %
Basophils Relative: 0 %
Eosinophils Absolute: 0 K/uL (ref 0.0–0.5)
Eosinophils Absolute: 0 K/uL (ref 0.0–0.5)
Eosinophils Relative: 0 %
Eosinophils Relative: 0 %
HCT: 26 % — ABNORMAL LOW (ref 39.0–52.0)
HCT: 26.2 % — ABNORMAL LOW (ref 39.0–52.0)
Hemoglobin: 8.7 g/dL — ABNORMAL LOW (ref 13.0–17.0)
Hemoglobin: 8.8 g/dL — ABNORMAL LOW (ref 13.0–17.0)
Immature Granulocytes: 16 %
Immature Granulocytes: 13 %
Lymphocytes Relative: 28 %
Lymphocytes Relative: 30 %
Lymphs Abs: 3.2 K/uL (ref 0.7–4.0)
Lymphs Abs: 4.8 K/uL — ABNORMAL HIGH (ref 0.7–4.0)
MCH: 29.6 pg (ref 26.0–34.0)
MCH: 29.7 pg (ref 26.0–34.0)
MCHC: 33.5 g/dL (ref 30.0–36.0)
MCHC: 33.6 g/dL (ref 30.0–36.0)
MCV: 88.4 fL (ref 80.0–100.0)
MCV: 88.5 fL (ref 80.0–100.0)
Monocytes Absolute: 3 K/uL — ABNORMAL HIGH (ref 0.1–1.0)
Monocytes Absolute: 5 K/uL — ABNORMAL HIGH (ref 0.1–1.0)
Monocytes Relative: 26 %
Monocytes Relative: 31 %
Neutro Abs: 3.5 K/uL (ref 1.7–7.7)
Neutro Abs: 4.1 K/uL (ref 1.7–7.7)
Neutrophils Relative %: 30 %
Neutrophils Relative %: 26 %
Platelets: 9 K/uL — CL (ref 150–400)
Platelets: 36 K/uL — ABNORMAL LOW (ref 150–400)
RBC: 2.94 MIL/uL — ABNORMAL LOW (ref 4.22–5.81)
RBC: 2.96 MIL/uL — ABNORMAL LOW (ref 4.22–5.81)
RDW: 15.6 % — ABNORMAL HIGH (ref 11.5–15.5)
RDW: 15.6 % — ABNORMAL HIGH (ref 11.5–15.5)
Smear Review: NORMAL
Smear Review: NORMAL
WBC: 11.5 K/uL — ABNORMAL HIGH (ref 4.0–10.5)
WBC: 15.9 K/uL — ABNORMAL HIGH (ref 4.0–10.5)
nRBC: 0 % (ref 0.0–0.2)
nRBC: 0 % (ref 0.0–0.2)

## 2024-03-24 LAB — PREPARE PLATELET PHERESIS
Unit division: 0
Unit division: 0

## 2024-03-24 LAB — BASIC METABOLIC PANEL WITH GFR
Anion gap: 10 (ref 5–15)
BUN: 30 mg/dL — ABNORMAL HIGH (ref 8–23)
CO2: 24 mmol/L (ref 22–32)
Calcium: 7.8 mg/dL — ABNORMAL LOW (ref 8.9–10.3)
Chloride: 100 mmol/L (ref 98–111)
Creatinine, Ser: 1.61 mg/dL — ABNORMAL HIGH (ref 0.61–1.24)
GFR, Estimated: 44 mL/min — ABNORMAL LOW (ref >60)
Glucose, Bld: 176 mg/dL — ABNORMAL HIGH (ref 70–99)
Potassium: 3.6 mmol/L (ref 3.5–5.1)
Sodium: 134 mmol/L — ABNORMAL LOW (ref 135–145)

## 2024-03-24 LAB — BPAM PLATELET PHERESIS
Blood Product Expiration Date: 202510082359
Blood Product Expiration Date: 202510082359
ISSUE DATE / TIME: 202510071120
ISSUE DATE / TIME: 202510071441
Unit Type and Rh: 5100
Unit Type and Rh: 7300

## 2024-03-24 LAB — MAGNESIUM: Magnesium: 1.7 mg/dL (ref 1.7–2.4)

## 2024-03-24 LAB — GLUCOSE, CAPILLARY
Glucose-Capillary: 148 mg/dL — ABNORMAL HIGH (ref 70–99)
Glucose-Capillary: 151 mg/dL — ABNORMAL HIGH (ref 70–99)
Glucose-Capillary: 159 mg/dL — ABNORMAL HIGH (ref 70–99)
Glucose-Capillary: 163 mg/dL — ABNORMAL HIGH (ref 70–99)
Glucose-Capillary: 165 mg/dL — ABNORMAL HIGH (ref 70–99)
Glucose-Capillary: 167 mg/dL — ABNORMAL HIGH (ref 70–99)

## 2024-03-24 MED ORDER — LACTATED RINGERS IV SOLN: INTRAVENOUS | Status: DC

## 2024-03-24 MED ORDER — SODIUM CHLORIDE 0.9% IV SOLUTION: Freq: Once | INTRAVENOUS | Status: AC

## 2024-03-24 NOTE — Progress Notes (Signed)
 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 BP 110/68 (82), HR 100. Discussed with MD who recommended therapy when platelets > 12. Pt currently receiving platelets. Will follow up if appropriate tomorrow.  [name], PT, DPT Acute Rehabilitation Services Office [phone]    [name] 03/24/2024, 1:57 PM

## 2024-03-24 NOTE — Progress Notes (Signed)
 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  Workup in ED included CT PE protocol which was negative for PE.  Head CT was obtained due to complaint of headache and showed bilateral acute/subacute subdural hematomas.   The case was discussed with Dr. Zewdu who noted no need to transfer to [facility] (see 03/19/2024 note).  He was admitted to the ICU.  Neurosurgery was consulted who recommended supportive care.  Serial head CT's have shown stable subdural hematomas.  Case then discussed with Dr. Zeidner 10/5 who recommends transfer to [facility] for further care.     No beds available at [facility].  Currently transfusing as needed for thrombocytopenia.  Working on transfer to [facility] when able.  [facility] at capacity 10/5, 10/6, 10/7, 10/8.  Will continue to follow with [facility]. He's accepted contingent on bed availability.    Assessment & Plan:  Acute/Subacute Bilateral Subdural Hematomas CT with mixed density bilateral convexity extra axial hemorrhages with bilateral sulcal effacement and no significant midline shift on presentation 10/3 CT 10/4 unchanged bilateral subdurals  CT, 10/5 with R>L unchanged bilateral subdural hematoms Neurosurgery recommending continuing supportive care, outpatient monitoring of SDH Keppra 500 mg BID for seizure prophylaxis  Transfuse platelets for goal >50,000    AML  Severe Thrombocytopenia  Anemia Follows with [facility] outpatienr, Dr.Zeidner  Discussed with Dr. Onesimo here who recommended transfuse to platelet goal >50,000 in setting of above.   Discussed with Dr. Carolan, who recommended transfer to [facility] as well  Fibrinogen  wnl  Transfuse for Hb < 8 or PLTC < 50k in setting of SDH Hydroxyurea  on hold for now --- resume  once WBC over 10,000 Wife received call, they recommended resuming his study drug  Acute Metabolic Encephalopathy Generalized Weakness VBG without hypercarbia Ammonia wnl  Seems to be improving, related to acute hospitalization and pain meds Delirium precautions   Fever - no neutropenia; possibly central fever vs from AML Follow blood cultrues UA not c/w UTI CXR without acute abnormality Will monitor    HFmrEF PACs  PVCs  Atrial Flutter with RVR Mexiletine, metoprolol  IV metop prn sustained HR >120  Echo 02/2024 with EF 50-55% - Not an anticoagulation candidate in setting of SDH -Rate controlled on metoprolol  currently   AKI on CKD IIIa Improving, 1.76 on presentation  Baseline appears to be 1.2-1.3 (as recently as 10/1) -Renal ultrasound negative for hydronephrosis; mostly consistent with chronic medical renal disease - at risk for dehydration with poor intake; continue IVF for now until eating better   Hypokalemia  Hypomagnesemia  Replace and follow   Orthostatic Hypotension Noted, continue to monitor SBP dropped to 70's 10/6 - follow closely Compression stockings, abdominal binder, IVF (as necessary)  T2DM SSI   BPH Flomax   Interval History:  No events overnight.  Wife present bedside.  She still is advocating for [facility] transfer as well.  We did discuss code status and she does say he wouldn't want CPR/intubation at this point, but wishing to remain aggressive medically. Called [facility] and still no beds.  Was informed to also try calling at shift change. Hematology also called today to inquire status.   Old records reviewed in assessment of this patient  Antimicrobials:   DVT prophylaxis:  SCDs Start: 03/19/24 1858   Code Status:   Code Status: Limited: Do not attempt resuscitation (DNR) -DNR-LIMITED -Do Not Intubate/DNI   Mobility Assessment (Last 72 Hours)     Mobility Assessment     Row Name 03/24/24 0800 03/23/24 2000 03/23/24 1521 03/23/24 0800  03/22/24 2000   Does the patient have exclusion criteria? Yes - Bedfast (Level 1) - Select exclusion criteria in next row Yes - Bedfast (Level 1) - Select exclusion criteria in next row -- Yes - Bedfast (Level 1) - Select exclusion criteria in next row Yes - Bedfast (Level 1) - Select exclusion criteria in next row   Mobility Assessment Exclusion Criteria -- No exclusion criteria present, perform mobility assessment -- No exclusion criteria present, perform mobility assessment No exclusion criteria present, perform mobility assessment   What is the highest level of mobility based on the mobility assessment? Level 1 (Bedfast) - Unable to balance while sitting on edge of bed Level 3 (Stands with assistance) - Balance while standing  and cannot march in place Level 3 (Stands with assistance) - Balance while standing  and cannot march in place Level 2 (Chairfast) - Balance while sitting on edge of bed and cannot stand Level 3 (Stands with assistance) - Balance while standing  and cannot march in place   Is the above level different from baseline mobility prior to current illness? Yes - Recommend PT order Yes - Recommend PT order -- Yes - Recommend PT order Yes - Recommend PT order    Row Name 03/22/24 1536 03/22/24 1203 03/22/24 0417 03/22/24 0007 03/21/24 2003   Does the patient have exclusion criteria? -- Yes - Bedfast (Level 1) - Select exclusion criteria in next row Yes - Bedfast (Level 1) - Select exclusion criteria in next row (P)  Yes - Bedfast (Level 1) - Select exclusion criteria in next row Yes - Bedfast (Level 1) - Select exclusion criteria in next row   What is the highest level of mobility based on the mobility assessment? Level 3 (Stands with assistance) - Balance while standing  and cannot march in place -- Level 3 (Stands with assistance) - Balance while standing  and cannot march in place (P)  Level 3 (Stands with assistance) - Balance while standing  and cannot march in place Level 3 (Stands  with assistance) - Balance while standing  and cannot march in place   Is the above level different from baseline mobility prior to current illness? -- -- Yes - Recommend PT order (P)  Yes - Recommend PT order Yes - Recommend PT order      Barriers to discharge:  Disposition Plan: TBD Status is: Inpatient  Objective: Blood pressure 115/64, pulse 98, temperature 98.4 F (36.9 C), temperature source Oral, resp. rate 16, height 5' 10 (1.778 m), weight 83 kg, SpO2 100%.  Examination:  Physical Exam Constitutional:      Comments: Severely fatigued and lethargic appearing; opens eyes to voice and follows some commands  HENT:     Head: Normocephalic and atraumatic.     Mouth/Throat:     Mouth: Mucous membranes are moist.  Eyes:     Extraocular Movements: Extraocular movements intact.  Cardiovascular:     Rate and Rhythm: Normal rate and regular rhythm.  Pulmonary:     Effort: Pulmonary effort is normal. No respiratory distress.     Breath sounds: Normal breath sounds. No wheezing.  Abdominal:     General: Bowel  sounds are normal. There is no distension.     Palpations: Abdomen is soft.     Tenderness: There is no abdominal tenderness.  Musculoskeletal:        General: Normal range of motion.     Cervical back: Normal range of motion and neck supple.  Skin:    General: Skin is warm and dry.  Neurological:     Comments: Oriented to year only.  Following some commands and moving all 4 extremities but severely fatigued and confused      Consultants:  Hematology/oncology  Procedures:    Data Reviewed: Results for orders placed or performed during the hospital encounter of 03/19/24 (from the past 24 hours)  Glucose, capillary     Status: Abnormal   Collection Time: 03/23/24  8:02 PM  Result Value Ref Range   Glucose-Capillary 183 (H) 70 - 99 mg/dL   Comment 1 Notify RN   Glucose, capillary     Status: Abnormal   Collection Time: 03/23/24 11:40 PM  Result Value Ref Range    Glucose-Capillary 164 (H) 70 - 99 mg/dL   Comment 1 Notify RN   Glucose, capillary     Status: Abnormal   Collection Time: 03/24/24  3:26 AM  Result Value Ref Range   Glucose-Capillary 151 (H) 70 - 99 mg/dL   Comment 1 Notify RN   Basic metabolic panel with GFR     Status: Abnormal   Collection Time: 03/24/24  3:55 AM  Result Value Ref Range   Sodium 134 (L) 135 - 145 mmol/L   Potassium 3.6 3.5 - 5.1 mmol/L   Chloride 100 98 - 111 mmol/L   CO2 24 22 - 32 mmol/L   Glucose, Bld 176 (H) 70 - 99 mg/dL   BUN 30 (H) 8 - 23 mg/dL   Creatinine, Ser 8.38 (H) 0.61 - 1.24 mg/dL   Calcium  7.8 (L) 8.9 - 10.3 mg/dL   GFR, Estimated 44 (L) >60 mL/min   Anion gap 10 5 - 15  Magnesium      Status: None   Collection Time: 03/24/24  3:55 AM  Result Value Ref Range   Magnesium  1.7 1.7 - 2.4 mg/dL  Glucose, capillary     Status: Abnormal   Collection Time: 03/24/24  8:10 AM  Result Value Ref Range   Glucose-Capillary 165 (H) 70 - 99 mg/dL   Comment 1 Notify RN    Comment 2 Document in Chart   CBC with Differential/Platelet     Status: Abnormal   Collection Time: 03/24/24 10:02 AM  Result Value Ref Range   WBC 11.5 (H) 4.0 - 10.5 K/uL   RBC 2.94 (L) 4.22 - 5.81 MIL/uL   Hemoglobin 8.7 (L) 13.0 - 17.0 g/dL   HCT 73.9 (L) 60.9 - 47.9 %   MCV 88.4 80.0 - 100.0 fL   MCH 29.6 26.0 - 34.0 pg   MCHC 33.5 30.0 - 36.0 g/dL   RDW 84.3 (H) 88.4 - 84.4 %   Platelets 9 (LL) 150 - 400 K/uL   nRBC 0.0 0.0 - 0.2 %   Neutrophils Relative % 30 %   Neutro Abs 3.5 1.7 - 7.7 K/uL   Lymphocytes Relative 28 %   Lymphs Abs 3.2 0.7 - 4.0 K/uL   Monocytes Relative 26 %   Monocytes Absolute 3.0 (H) 0.1 - 1.0 K/uL   Eosinophils Relative 0 %   Eosinophils Absolute 0.0 0.0 - 0.5 K/uL   Basophils Relative 0 %   Basophils  Absolute 0.0 0.0 - 0.1 K/uL   WBC Morphology See Note    RBC Morphology MORPHOLOGY UNREMARKABLE    Smear Review Normal platelet morphology    Immature Granulocytes 16 %   Abs Immature  Granulocytes 1.84 (H) 0.00 - 0.07 K/uL  Prepare platelet pheresis     Status: None (Preliminary result)   Collection Time: 03/24/24 11:00 AM  Result Value Ref Range   Unit Number [id number]    Blood Component Type PLTP2 PSORALEN TREATED    Unit division 00    Status of Unit ISSUED    Transfusion Status      OK TO TRANSFUSE Performed at [facility], [address], [place], [place] [address]    Unit Number [id number]    Blood Component Type PLTP2 PSORALEN TREATED    Unit division 00    Status of Unit ISSUED    Transfusion Status OK TO TRANSFUSE   Glucose, capillary     Status: Abnormal   Collection Time: 03/24/24 11:43 AM  Result Value Ref Range   Glucose-Capillary 167 (H) 70 - 99 mg/dL   Comment 1 Notify RN    Comment 2 Document in Chart   Glucose, capillary     Status: Abnormal   Collection Time: 03/24/24  4:40 PM  Result Value Ref Range   Glucose-Capillary 148 (H) 70 - 99 mg/dL   Comment 1 Notify RN    Comment 2 Document in Chart     I have reviewed pertinent nursing notes, vitals, labs, and images as necessary. I have ordered labwork to follow up on as indicated.  I have reviewed the last notes from staff over past 24 hours. I have discussed patient's care plan and test results with nursing staff, CM/SW, and other staff as appropriate.  Time spent: Greater than 50% of the 55 minute visit was spent in counseling/coordination of care for the patient as laid out in the A&P.   LOS: 5 days   Alm Apo, MD [facility] 03/24/2024, 4:54 PM

## 2024-03-24 NOTE — Plan of Care (Signed)
  Problem: Health Behavior/Discharge Planning: Goal: Ability to manage health-related needs will improve Outcome: Progressing   Problem: Clinical Measurements: Goal: Will remain free from infection Outcome: Progressing   Problem: Clinical Measurements: Goal: Diagnostic test results will improve Outcome: Progressing   Problem: Clinical Measurements: Goal: Cardiovascular complication will be avoided Outcome: Progressing   Problem: Activity: Goal: Risk for activity intolerance will decrease Outcome: Progressing   Problem: Nutrition: Goal: Adequate nutrition will be maintained Outcome: Progressing   Problem: Elimination: Goal: Will not experience complications related to bowel motility Outcome: Progressing   Problem: Safety: Goal: Ability to remain free from injury will improve Outcome: Progressing   Problem: Skin Integrity: Goal: Risk for impaired skin integrity will decrease Outcome: Progressing

## 2024-03-24 NOTE — Hospital Course (Addendum)
 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   Workup in ED included CT PE protocol which was negative for PE.  Head CT was obtained due to complaint of headache and showed bilateral acute/subacute subdural hematomas.   The case was discussed with Dr. Zewdu who noted no need to transfer to [facility] (see 03/19/2024 note).  He was admitted to the ICU initially.  Neurosurgery was consulted who recommended supportive care.   Serial head CT's have shown stable subdural hematomas as of 10/5.  Case discussed next with Dr. Zeidner 10/5 who recommended transfer to [facility] for further continuity of care. Wife in agreement.   Main plan of care during hospitalization was transfusing platelets as needed for persistent severe thrombocytopenia especially in setting of underlying subdural hematomas.  Given some waxing and waning mentation, repeat CT head was performed on 10/11.  This showed some progression of underlying subdural hematomas however he had no change in neurologic function nor deficits.  Neurosurgery reevaluated after scans and again recommended no further intervention nor repeating imaging unless neurologic deficits.    Assessment & Plan:  Acute/Subacute Bilateral Subdural Hematomas - CT with mixed density bilateral convexity extra axial hemorrhages with bilateral sulcal effacement and no significant midline shift on presentation 10/3 - CT 10/4 unchanged bilateral subdurals  - CT, 10/5 with R>L unchanged bilateral subdural hematoms - Neurosurgery recommending continuing supportive care, outpatient monitoring of SDH - Keppra 500 mg BID for seizure prophylaxis  - Transfuse platelets for goal >50,000  - CTH repeated 10/11 for follow up.  Showing some progression of SDH (R increased from 13 mm to 19 mm and left increased from 7 mm to 13 mm, now with 7 mm left shift too) - still giving PLT in  efforts to keep >50 k; reevaluated by neurosurgery on 10/11.  Recommended no further intervention or imaging unless neurodeficits   AML  Severe Thrombocytopenia  Anemia - Follows with [facility] outpatienr, Dr. Carolan  - Discussed with Dr. Onesimo here who recommended transfuse to platelet goal >50,000 in setting of above - Discussed with Dr. Carolan, who recommended transfer to [facility] as well  - Fibrinogen  wnl  - Transfuse for Hb < 8 or PLTC < 50k in setting of SDH - Hydroxyurea  resumed 10/9 since WBC > 10k - study drug has been resumed (as of 10/7) per wife after hearing from [facility]  Acute Metabolic Encephalopathy Generalized Weakness - VBG without hypercarbia - Ammonia wnl  - Seems to be improving; multifactorial with SDH, AML, deconditioning, ~FTT -Some waxing and waning of mentation usually from exhaustion and/or many visitors    Fever Leukocytosis - no neutropenia; possibly central fever vs from AML vs infection - Leukocytosis with bandemia largely attributed to underlying AML - 10/6 blood cultures negative to date - UA not c/w UTI - CXR without acute abnormality - recurrent fever 03/26/24 @101  F and started on abx overnight of 10/10 - So far no further fevers.  Continue vancomycin and cefepime empirically for now   HFmrEF PACs  PVCs  Atrial Flutter with RVR - Mexiletine, metoprolol  - Echo 02/2024 with EF 50-55% - Not an anticoagulation candidate in setting of SDH - Rate controlled on metoprolol  currently   AKI on CKD IIIa Improving, 1.76 on presentation  Baseline appears to be 1.2-1.3 (as recently as 10/1) -Renal ultrasound negative for hydronephrosis; mostly consistent with chronic medical renal  disease - at risk for dehydration with poor intake; continue IVF for now until eating better   Hypokalemia  Hypomagnesemia  Replete as needed   Orthostatic Hypotension Noted, continue to monitor -SBP has since improved and remaining stable -If necessary, can resume compression  stockings, fluids, abdominal binder - Lasix  and spironolactone  on hold  T2DM -Used sliding scale insulin  with NovoLog  only - Tresiba on hold while oral intake low   BPH Flomax   Sacral pressure ulcer - Cleanse sacrum and B buttocks with Vashe wound cleanser do not rinse and allow to air dry.  Apply Xeroform gauze to wound bed daily and secure with silicone foam or ABD pad and tape

## 2024-03-24 NOTE — Progress Notes (Signed)
 BRIEF ONCOLOGY NOTE:  [facility] Patient [facility] [phone] and spoke with Nat.  Stressed importance of transferring patient who is diagnosed with AML which is being managed by their Leukemia service.  Furthermore that patient has been on clinical trial/study drug which needs to be followed by them.  Nat stated they do not have bed availability at this time, however patient is on their list.  Olam JINNY Brunner, NP

## 2024-03-25 DIAGNOSIS — D696 Thrombocytopenia, unspecified: Secondary | ICD-10-CM | POA: Diagnosis not present

## 2024-03-25 DIAGNOSIS — C9200 Acute myeloblastic leukemia, not having achieved remission: Secondary | ICD-10-CM | POA: Diagnosis not present

## 2024-03-25 DIAGNOSIS — S065XAA Traumatic subdural hemorrhage with loss of consciousness status unknown, initial encounter: Secondary | ICD-10-CM | POA: Diagnosis not present

## 2024-03-25 LAB — CBC WITH DIFFERENTIAL/PLATELET
Abs Immature Granulocytes: 4.73 K/uL — ABNORMAL HIGH (ref 0.00–0.07)
Abs Immature Granulocytes: 2.71 K/uL — ABNORMAL HIGH (ref 0.00–0.07)
Basophils Absolute: 0 K/uL (ref 0.0–0.1)
Basophils Absolute: 0.1 K/uL (ref 0.0–0.1)
Basophils Relative: 0 %
Basophils Relative: 0 %
Eosinophils Absolute: 0 K/uL (ref 0.0–0.5)
Eosinophils Absolute: 0 K/uL (ref 0.0–0.5)
Eosinophils Relative: 0 %
Eosinophils Relative: 0 %
HCT: 27.2 % — ABNORMAL LOW (ref 39.0–52.0)
HCT: 23.8 % — ABNORMAL LOW (ref 39.0–52.0)
Hemoglobin: 9.1 g/dL — ABNORMAL LOW (ref 13.0–17.0)
Hemoglobin: 7.9 g/dL — ABNORMAL LOW (ref 13.0–17.0)
Immature Granulocytes: 29 %
Immature Granulocytes: 14 %
Lymphocytes Relative: 7 %
Lymphocytes Relative: 9 %
Lymphs Abs: 1.2 K/uL (ref 0.7–4.0)
Lymphs Abs: 1.7 K/uL (ref 0.7–4.0)
MCH: 29.5 pg (ref 26.0–34.0)
MCH: 29.5 pg (ref 26.0–34.0)
MCHC: 33.5 g/dL (ref 30.0–36.0)
MCHC: 33.2 g/dL (ref 30.0–36.0)
MCV: 88.3 fL (ref 80.0–100.0)
MCV: 88.8 fL (ref 80.0–100.0)
Monocytes Absolute: 5.5 K/uL — ABNORMAL HIGH (ref 0.1–1.0)
Monocytes Absolute: 10.1 K/uL — ABNORMAL HIGH (ref 0.1–1.0)
Monocytes Relative: 35 %
Monocytes Relative: 51 %
Neutro Abs: 4.7 K/uL (ref 1.7–7.7)
Neutro Abs: 5.1 K/uL (ref 1.7–7.7)
Neutrophils Relative %: 29 %
Neutrophils Relative %: 26 %
Platelets: 16 K/uL — CL (ref 150–400)
Platelets: 47 K/uL — ABNORMAL LOW (ref 150–400)
RBC: 3.08 MIL/uL — ABNORMAL LOW (ref 4.22–5.81)
RBC: 2.68 MIL/uL — ABNORMAL LOW (ref 4.22–5.81)
RDW: 15.8 % — ABNORMAL HIGH (ref 11.5–15.5)
RDW: 15.8 % — ABNORMAL HIGH (ref 11.5–15.5)
Smear Review: NORMAL
WBC: 16.2 K/uL — ABNORMAL HIGH (ref 4.0–10.5)
WBC: 19.7 K/uL — ABNORMAL HIGH (ref 4.0–10.5)
nRBC: 0 % (ref 0.0–0.2)
nRBC: 0 % (ref 0.0–0.2)

## 2024-03-25 LAB — BASIC METABOLIC PANEL WITH GFR
Anion gap: 12 (ref 5–15)
BUN: 25 mg/dL — ABNORMAL HIGH (ref 8–23)
CO2: 24 mmol/L (ref 22–32)
Calcium: 7.9 mg/dL — ABNORMAL LOW (ref 8.9–10.3)
Chloride: 99 mmol/L (ref 98–111)
Creatinine, Ser: 1.52 mg/dL — ABNORMAL HIGH (ref 0.61–1.24)
GFR, Estimated: 47 mL/min — ABNORMAL LOW (ref >60)
Glucose, Bld: 132 mg/dL — ABNORMAL HIGH (ref 70–99)
Potassium: 3.5 mmol/L (ref 3.5–5.1)
Sodium: 135 mmol/L (ref 135–145)

## 2024-03-25 LAB — BPAM PLATELET PHERESIS
Blood Product Expiration Date: 202510102359
Blood Product Expiration Date: 202510102359
ISSUE DATE / TIME: 202510081219
ISSUE DATE / TIME: 202510081522
Unit Type and Rh: 6200
Unit Type and Rh: 7300

## 2024-03-25 LAB — GLUCOSE, CAPILLARY
Glucose-Capillary: 118 mg/dL — ABNORMAL HIGH (ref 70–99)
Glucose-Capillary: 144 mg/dL — ABNORMAL HIGH (ref 70–99)
Glucose-Capillary: 162 mg/dL — ABNORMAL HIGH (ref 70–99)
Glucose-Capillary: 155 mg/dL — ABNORMAL HIGH (ref 70–99)
Glucose-Capillary: 123 mg/dL — ABNORMAL HIGH (ref 70–99)

## 2024-03-25 LAB — PREPARE PLATELET PHERESIS
Unit division: 0
Unit division: 0

## 2024-03-25 LAB — MAGNESIUM: Magnesium: 1.7 mg/dL (ref 1.7–2.4)

## 2024-03-25 MED ORDER — HYDROXYUREA 500 MG PO CAPS
2000.0000 mg | ORAL_CAPSULE | Freq: Two times a day (BID) | ORAL | Status: DC
  Administered 2024-03-25 – 2024-03-28 (×7): 2000 mg via ORAL
  Filled 2024-03-25 (×8): qty 4

## 2024-03-25 MED ORDER — ORAL CARE MOUTH RINSE: 15.0000 mL | OROMUCOSAL | Status: DC | PRN

## 2024-03-25 MED ORDER — SODIUM CHLORIDE 0.9% IV SOLUTION: Freq: Once | INTRAVENOUS | Status: AC

## 2024-03-25 NOTE — Progress Notes (Signed)
 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  Workup in ED included CT PE protocol which was negative for PE.  Head CT was obtained due to complaint of headache and showed bilateral acute/subacute subdural hematomas.   The case was discussed with Dr. Zewdu who noted no need to transfer to [facility] (see 03/19/2024 note).  He was admitted to the ICU.  Neurosurgery was consulted who recommended supportive care.  Serial head CT's have shown stable subdural hematomas.  Case then discussed with Dr. Zeidner 10/5 who recommends transfer to [facility] for further care.     No beds available at [facility].  Currently transfusing as needed for thrombocytopenia.  Working on transfer to [facility] when able.  [facility] at capacity 10/5, 10/6, 10/7, 10/8, 10/9.  Will continue to follow with [facility]. He's accepted contingent on bed availability and room on heme/onc service.    Assessment & Plan:  Acute/Subacute Bilateral Subdural Hematomas CT with mixed density bilateral convexity extra axial hemorrhages with bilateral sulcal effacement and no significant midline shift on presentation 10/3 CT 10/4 unchanged bilateral subdurals  CT, 10/5 with R>L unchanged bilateral subdural hematoms Neurosurgery recommending continuing supportive care, outpatient monitoring of SDH Keppra 500 mg BID for seizure prophylaxis  Transfuse platelets for goal >50,000    AML  Severe Thrombocytopenia  Anemia Follows with [facility] outpatienr, Dr.Zeidner  Discussed with Dr. Onesimo here who recommended transfuse to platelet goal >50,000 in setting of above.   Discussed with Dr. Carolan, who recommended transfer to [facility] as well  Fibrinogen  wnl  Transfuse for Hb < 8 or PLTC < 50k in setting of  SDH Hydroxyurea  resumed 10/9 since WBC > 10k Wife received call, they recommended resuming his study drug  Acute Metabolic Encephalopathy Generalized Weakness VBG without hypercarbia Ammonia wnl  Seems to be improving, related to acute hospitalization and pain meds Delirium precautions   Fever - no neutropenia; possibly central fever vs from AML Follow blood cultrues UA not c/w UTI CXR without acute abnormality Will monitor    HFmrEF PACs  PVCs  Atrial Flutter with RVR Mexiletine, metoprolol  Echo 02/2024 with EF 50-55% - Not an anticoagulation candidate in setting of SDH -Rate controlled on metoprolol  currently   AKI on CKD IIIa Improving, 1.76 on presentation  Baseline appears to be 1.2-1.3 (as recently as 10/1) -Renal ultrasound negative for hydronephrosis; mostly consistent with chronic medical renal disease - at risk for dehydration with poor intake; continue IVF for now until eating better   Hypokalemia  Hypomagnesemia  Replete as needed   Orthostatic Hypotension Noted, continue to monitor SBP dropped to 70's 10/6 - follow closely Compression stockings, abdominal binder, IVF (as necessary)  T2DM SSI   BPH Flomax   Interval History:  No events overnight.  Wife present bedside. Patient remains lethargic and minimally responsive/awake at times. Yesterday afternoon after PLT he was awake and oriented and eating. Seems to wax and wane with energy and mentation.  Could not get him to follow any commands for me this morning and does not withdraw to pain. Called [facility] twice today.  This afternoon they did have beds come open but no availability on heme-onc service however unfortunately.  Will continue calling  daily.    Old records reviewed in assessment of this patient  Antimicrobials:   DVT prophylaxis:  SCDs Start: 03/19/24 1858   Code Status:   Code Status: Limited: Do not attempt resuscitation (DNR) -DNR-LIMITED -Do Not Intubate/DNI   Mobility Assessment  (Last 72 Hours)     Mobility Assessment     Row Name 03/25/24 1400 03/25/24 1100 03/25/24 0800 03/24/24 2000 03/24/24 0800   Does the patient have exclusion criteria? -- -- Yes - Bedfast (Level 1) - Select exclusion criteria in next row Yes - Bedfast (Level 1) - Select exclusion criteria in next row Yes - Bedfast (Level 1) - Select exclusion criteria in next row   Mobility Assessment Exclusion Criteria -- -- -- No exclusion criteria present, perform mobility assessment --   What is the highest level of mobility based on the mobility assessment? Level 2 (Chairfast) - Balance while sitting on edge of bed and cannot stand Level 2 (Chairfast) - Balance while sitting on edge of bed and cannot stand Level 1 (Bedfast) - Unable to balance while sitting on edge of bed Level 3 (Stands with assistance) - Balance while standing  and cannot march in place Level 1 (Bedfast) - Unable to balance while sitting on edge of bed   Is the above level different from baseline mobility prior to current illness? -- -- Yes - Recommend PT order Yes - Recommend PT order Yes - Recommend PT order    Row Name 03/23/24 2000 03/23/24 1521 03/23/24 0800 03/22/24 2000     Does the patient have exclusion criteria? Yes - Bedfast (Level 1) - Select exclusion criteria in next row -- Yes - Bedfast (Level 1) - Select exclusion criteria in next row Yes - Bedfast (Level 1) - Select exclusion criteria in next row    Mobility Assessment Exclusion Criteria No exclusion criteria present, perform mobility assessment -- No exclusion criteria present, perform mobility assessment No exclusion criteria present, perform mobility assessment    What is the highest level of mobility based on the mobility assessment? Level 3 (Stands with assistance) - Balance while standing  and cannot march in place Level 3 (Stands with assistance) - Balance while standing  and cannot march in place Level 2 (Chairfast) - Balance while sitting on edge of bed and cannot stand  Level 3 (Stands with assistance) - Balance while standing  and cannot march in place    Is the above level different from baseline mobility prior to current illness? Yes - Recommend PT order -- Yes - Recommend PT order Yes - Recommend PT order       Barriers to discharge:  Disposition Plan: TBD Status is: Inpatient  Objective: Blood pressure (!) 97/59, pulse 96, temperature 98.9 F (37.2 C), temperature source Oral, resp. rate 20, height 5' 10 (1.778 m), weight 83 kg, SpO2 94%.  Examination:  Physical Exam Constitutional:      Comments: Severely fatigued and lethargic appearing; opens eyes but unable to follow any commands for me this morning  HENT:     Head: Normocephalic and atraumatic.     Mouth/Throat:     Mouth: Mucous membranes are moist.  Eyes:     Extraocular Movements: Extraocular movements intact.  Cardiovascular:     Rate and Rhythm: Normal rate and regular rhythm.  Pulmonary:     Effort: Pulmonary effort is normal. No respiratory distress.     Breath sounds: Normal breath sounds. No wheezing.  Abdominal:     General: Bowel sounds  are normal. There is no distension.     Palpations: Abdomen is soft.     Tenderness: There is no abdominal tenderness.  Musculoskeletal:        General: Normal range of motion.     Cervical back: Normal range of motion and neck supple.  Skin:    General: Skin is warm and dry.  Neurological:     Comments: Moving all 4 extremities but not following commands for me this morning      Consultants:  Hematology/oncology  Procedures:    Data Reviewed: Results for orders placed or performed during the hospital encounter of 03/19/24 (from the past 24 hours)  Glucose, capillary     Status: Abnormal   Collection Time: 03/24/24  4:40 PM  Result Value Ref Range   Glucose-Capillary 148 (H) 70 - 99 mg/dL   Comment 1 Notify RN    Comment 2 Document in Chart   CBC with Differential/Platelet     Status: Abnormal   Collection Time: 03/24/24   6:55 PM  Result Value Ref Range   WBC 15.9 (H) 4.0 - 10.5 K/uL   RBC 2.96 (L) 4.22 - 5.81 MIL/uL   Hemoglobin 8.8 (L) 13.0 - 17.0 g/dL   HCT 73.7 (L) 60.9 - 47.9 %   MCV 88.5 80.0 - 100.0 fL   MCH 29.7 26.0 - 34.0 pg   MCHC 33.6 30.0 - 36.0 g/dL   RDW 84.3 (H) 88.4 - 84.4 %   Platelets 36 (L) 150 - 400 K/uL   nRBC 0.0 0.0 - 0.2 %   Neutrophils Relative % 26 %   Neutro Abs 4.1 1.7 - 7.7 K/uL   Lymphocytes Relative 30 %   Lymphs Abs 4.8 (H) 0.7 - 4.0 K/uL   Monocytes Relative 31 %   Monocytes Absolute 5.0 (H) 0.1 - 1.0 K/uL   Eosinophils Relative 0 %   Eosinophils Absolute 0.0 0.0 - 0.5 K/uL   Basophils Relative 0 %   Basophils Absolute 0.0 0.0 - 0.1 K/uL   WBC Morphology See Note    Smear Review Normal platelet morphology    Immature Granulocytes 13 %   Abs Immature Granulocytes 2.05 (H) 0.00 - 0.07 K/uL   Polychromasia PRESENT   Glucose, capillary     Status: Abnormal   Collection Time: 03/24/24  8:52 PM  Result Value Ref Range   Glucose-Capillary 163 (H) 70 - 99 mg/dL  Glucose, capillary     Status: Abnormal   Collection Time: 03/24/24 11:56 PM  Result Value Ref Range   Glucose-Capillary 159 (H) 70 - 99 mg/dL  Glucose, capillary     Status: Abnormal   Collection Time: 03/25/24  4:36 AM  Result Value Ref Range   Glucose-Capillary 118 (H) 70 - 99 mg/dL  Basic metabolic panel with GFR     Status: Abnormal   Collection Time: 03/25/24  5:35 AM  Result Value Ref Range   Sodium 135 135 - 145 mmol/L   Potassium 3.5 3.5 - 5.1 mmol/L   Chloride 99 98 - 111 mmol/L   CO2 24 22 - 32 mmol/L   Glucose, Bld 132 (H) 70 - 99 mg/dL   BUN 25 (H) 8 - 23 mg/dL   Creatinine, Ser 8.47 (H) 0.61 - 1.24 mg/dL   Calcium  7.9 (L) 8.9 - 10.3 mg/dL   GFR, Estimated 47 (L) >60 mL/min   Anion gap 12 5 - 15  CBC with Differential/Platelet     Status: Abnormal   Collection  Time: 03/25/24  5:35 AM  Result Value Ref Range   WBC 16.2 (H) 4.0 - 10.5 K/uL   RBC 3.08 (L) 4.22 - 5.81 MIL/uL    Hemoglobin 9.1 (L) 13.0 - 17.0 g/dL   HCT 72.7 (L) 60.9 - 47.9 %   MCV 88.3 80.0 - 100.0 fL   MCH 29.5 26.0 - 34.0 pg   MCHC 33.5 30.0 - 36.0 g/dL   RDW 84.1 (H) 88.4 - 84.4 %   Platelets 16 (LL) 150 - 400 K/uL   nRBC 0.0 0.0 - 0.2 %   Neutrophils Relative % 29 %   Neutro Abs 4.7 1.7 - 7.7 K/uL   Lymphocytes Relative 7 %   Lymphs Abs 1.2 0.7 - 4.0 K/uL   Monocytes Relative 35 %   Monocytes Absolute 5.5 (H) 0.1 - 1.0 K/uL   Eosinophils Relative 0 %   Eosinophils Absolute 0.0 0.0 - 0.5 K/uL   Basophils Relative 0 %   Basophils Absolute 0.0 0.0 - 0.1 K/uL   WBC Morphology See Note    Smear Review See Note    Immature Granulocytes 29 %   Abs Immature Granulocytes 4.73 (H) 0.00 - 0.07 K/uL   Polychromasia PRESENT   Magnesium      Status: None   Collection Time: 03/25/24  5:35 AM  Result Value Ref Range   Magnesium  1.7 1.7 - 2.4 mg/dL  Prepare platelet pheresis     Status: None (Preliminary result)   Collection Time: 03/25/24  6:41 AM  Result Value Ref Range   Unit Number [id number]    Blood Component Type PLTP2 PSORALEN TREATED    Unit division 00    Status of Unit ISSUED    Transfusion Status OK TO TRANSFUSE    Unit Number [id number]    Blood Component Type PLTP2 PSORALEN TREATED    Unit division 00    Status of Unit ISSUED    Transfusion Status      OK TO TRANSFUSE Performed at [facility], [address], [place], [place] [address]   Glucose, capillary     Status: Abnormal   Collection Time: 03/25/24  8:22 AM  Result Value Ref Range   Glucose-Capillary 144 (H) 70 - 99 mg/dL   Comment 1 Notify RN    Comment 2 Document in Chart   Glucose, capillary     Status: Abnormal   Collection Time: 03/25/24 12:15 PM  Result Value Ref Range   Glucose-Capillary 162 (H) 70 - 99 mg/dL   Comment 1 Notify RN    Comment 2 Document in Chart   Glucose, capillary     Status: Abnormal   Collection Time: 03/25/24  3:31 PM  Result Value Ref Range   Glucose-Capillary 155 (H)  70 - 99 mg/dL   Comment 1 Notify RN    Comment 2 Document in Chart     I have reviewed pertinent nursing notes, vitals, labs, and images as necessary. I have ordered labwork to follow up on as indicated.  I have reviewed the last notes from staff over past 24 hours. I have discussed patient's care plan and test results with nursing staff, CM/SW, and other staff as appropriate.  Time spent: Greater than 50% of the 55 minute visit was spent in counseling/coordination of care for the patient as laid out in the A&P.   LOS: 6 days   Alm Apo, MD [facility] 03/25/2024, 4:04 PM

## 2024-03-25 NOTE — Progress Notes (Signed)
 Occupational Therapy Treatment Patient Details Name: Paul FARIA, MD MRN: [id number] DOB: January 26, 1946 Today's Date: 03/25/2024   History of present illness Pt is a 78 y.o. male presenting 10/3 with progressive generalized weakness and shortness of breath over the past 2 weeks. Found to have acute on subacute bilateral subdural hematomas in the setting of his known severe thrombocytopenia. Admitted for neuro eval. PMH: DM, OSA, chronic systolic CHF with AICD placement, hypogonadism, HTN, stage 3 CKD, TKR bil, TIA, acute myeloid leukemia undergoing treatment at [facility], orthostatic hypotension, CAD with PCI   OT comments  Patient received in supine and appeared lethargic but would respond when named called and agreeable to OT/PT treatment. Compression stocking donned while supine with BP 115/78 (91).  Patient was max assist +2 to get to EOB and required assistance for balance due to left lateral leaning and difficulty holding up head.  BP seated on EOB 112/62 (74).  ACE wrap used as ab binder while on EOB.  Patient became more difficult to understand while on EOB and BP decreased to 96/59 (72) and patient asked to return to supine with total assist +2.  BP increased to 115/78 (91) once back in supine.  Discharge recommendations continue to be appropriate.  Acute OT to continue to follow to address established goals to facilitate DC to next venue of care.        If plan is discharge home, recommend the following:  A lot of help with walking and/or transfers;Two people to help with walking and/or transfers;A lot of help with bathing/dressing/bathroom;Assistance with cooking/housework;Assist for transportation;Help with stairs or ramp for entrance   Equipment Recommendations  BSC/3in1    Recommendations for Other Services      Precautions / Restrictions Precautions Precautions: Fall;Other (comment) Recall of Precautions/Restrictions: Intact Precaution/Restrictions Comments: orthostatic, pressure  injury on coccyx, bil TED hose, abdominal binder, watch HR Restrictions Weight Bearing Restrictions Per Provider Order: No       Mobility Bed Mobility Overal bed mobility: Needs Assistance Bed Mobility: Supine to Sit, Sit to Supine     Supine to sit: Max assist, +2 for physical assistance, +2 for safety/equipment, HOB elevated Sit to supine: Total assist, +2 for physical assistance, +2 for safety/equipment   General bed mobility comments: patient attempting to move BLE off EOB with cues but requiring max assist to complete with assistance for trunk from another to get to EOB.  Total assist +2 to return to supine    Transfers                   General transfer comment: deferred due to increased lethargy and soft BP     Balance Overall balance assessment: Needs assistance Sitting-balance support: Feet supported, Single extremity supported, Bilateral upper extremity supported Sitting balance-Leahy Scale: Poor Sitting balance - Comments: L lateral lean, needing CGA-minA for static sitting balance. Pt keeps his neck flexed and head down, needing maxA to extend and look up.       Standing balance comment: deferred due to lethargy and soft BP                           ADL either performed or assessed with clinical judgement   ADL Overall ADL's : Needs assistance/impaired     Grooming: Wash/dry face;Minimal assistance;Sitting Grooming Details (indicate cue type and reason): min assist to initiate  Extremity/Trunk Assessment              Occupational psychologist Communication: Impaired Factors Affecting Communication: Reduced clarity of speech (when sitting onEOB, soft spoken throughout)   Cognition Arousal: Lethargic Behavior During Therapy: Flat affect Cognition: Cognition impaired       Memory impairment (select all impairments): Short-term memory,  Declarative long-term memory, Working memory Attention impairment (select first level of impairment): Sustained attention, Focused attention Executive functioning impairment (select all impairments): Organization, Sequencing, Problem solving OT - Cognition Comments: difficult to understand when sitting on EOB when asked for place, was able to say Thursday clearly when asked what day of the week                 Following commands: Impaired Following commands impaired: Follows one step commands with increased time, Follows one step commands inconsistently      Cueing   Cueing Techniques: Verbal cues, Tactile cues, Gestural cues  Exercises      Shoulder Instructions       General Comments Compression stockings donned and ACE wrap utilized due to no abdominal binder    Pertinent Vitals/ Pain       Pain Assessment Pain Assessment: Faces Faces Pain Scale: Hurts a little bit Pain Location: generalized Pain Descriptors / Indicators: Grimacing Pain Intervention(s): Limited activity within patient's tolerance, Monitored during session, Repositioned  Home Living                                          Prior Functioning/Environment              Frequency  Min 2X/week        Progress Toward Goals  OT Goals(current goals can now be found in the care plan section)  Progress towards OT goals: Progressing toward goals  Acute Rehab OT Goals Patient Stated Goal: none stated OT Goal Formulation: With patient Time For Goal Achievement: 04/03/24 Potential to Achieve Goals: Good ADL Goals Pt Will Perform Grooming: with supervision;standing Pt Will Perform Lower Body Bathing: with modified independence;sit to/from stand Pt Will Perform Lower Body Dressing: sit to/from stand;with supervision Pt Will Transfer to Toilet: ambulating;with supervision Pt/caregiver will Perform Home Exercise Program: Both right and left upper extremity;With written HEP  provided Additional ADL Goal #1: pt will identify and implement compensatory techniques or counter pressure exercises to self manage orthostatic BPs Additional ADL Goal #2: pt will state 3 things he can do at home to conserve energy during adls without cues  Plan      Co-evaluation    PT/OT/SLP Co-Evaluation/Treatment: Yes Reason for Co-Treatment: Complexity of the patient's impairments (multi-system involvement);Necessary to address cognition/behavior during functional activity;For patient/therapist safety;To address functional/ADL transfers;Other (comment) (lethargic) PT goals addressed during session: Mobility/safety with mobility;Balance;Strengthening/ROM OT goals addressed during session: ADL's and self-care      AM-PAC OT 6 Clicks Daily Activity     Outcome Measure   Help from another person eating meals?: A Little Help from another person taking care of personal grooming?: A Little Help from another person toileting, which includes using toliet, bedpan, or urinal?: A Lot Help from another person bathing (including washing, rinsing, drying)?: A Lot Help from another person to put on and taking off regular upper body clothing?: A Little Help from another person  to put on and taking off regular lower body clothing?: A Lot 6 Click Score: 15    End of Session    OT Visit Diagnosis: Unsteadiness on feet (R26.81);Muscle weakness (generalized) (M62.81);Other symptoms and signs involving cognitive function   Activity Tolerance Patient limited by lethargy   Patient Left in bed;with call bell/phone within reach;with family/visitor present   Nurse Communication Mobility status        Time: 8950-8880 OT Time Calculation (min): 30 min  Charges: OT General Charges $OT Visit: 1 Visit OT Treatments $Therapeutic Activity: 8-22 mins  Dick Laine, OTA Acute Rehabilitation Services  Office [phone]   Jeb LITTIE Laine 03/25/2024, 2:57 PM

## 2024-03-25 NOTE — Plan of Care (Signed)
  Problem: Education: Goal: Knowledge of General Education information will improve Description: Including pain rating scale, medication(s)/side effects and non-pharmacologic comfort measures Outcome: Progressing   Problem: Health Behavior/Discharge Planning: Goal: Ability to manage health-related needs will improve Outcome: Progressing   Problem: Clinical Measurements: Goal: Will remain free from infection Outcome: Progressing   Problem: Clinical Measurements: Goal: Diagnostic test results will improve Outcome: Progressing   Problem: Activity: Goal: Risk for activity intolerance will decrease Outcome: Progressing   Problem: Nutrition: Goal: Adequate nutrition will be maintained Outcome: Progressing   Problem: Coping: Goal: Level of anxiety will decrease Outcome: Progressing   Problem: Elimination: Goal: Will not experience complications related to bowel motility Outcome: Progressing   Problem: Safety: Goal: Ability to remain free from injury will improve Outcome: Progressing   Problem: Skin Integrity: Goal: Risk for impaired skin integrity will decrease Outcome: Progressing

## 2024-03-25 NOTE — Progress Notes (Signed)
 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 male presenting 10/3 with progressive generalized weakness and shortness of breath over the past 2 weeks. Found to have acute on subacute bilateral subdural hematomas in the setting of his known severe thrombocytopenia. Admitted for neuro eval. PMH: DM, OSA, chronic systolic CHF with AICD placement, hypogonadism, HTN, stage 3 CKD, TKR bil, TIA, acute myeloid leukemia undergoing treatment at [facility], orthostatic hypotension, CAD with PCI    PT Comments  The pt was very lethargic today and is requiring increased assistance for all functional mobility, displaying inconsistent command following and poor initiation. Despite using bil thigh high TED hose, SCDs, and ACE wrap around abdomen, he continued to display having symptomatic (decreased clarity of speech and arousal) orthostatic hypotension, see BP measurements below. He is currently requiring maxAx2 to transition supine to sit, total assist to transition sit to supine, and CGA-minA for static sitting balance. He also displays poor neck strength as he often sat with his neck flexed and head down with poor initiation to look up and hold his head up. Will continue to follow acutely.   BP with bil thigh-high TED hose and SCDs donned -  115/78 supine 112/62 initially sitting EOB 96/59 several minutes sitting EOB with ACE wraps applied around abdomen 115/69 supine end of session      If plan is discharge home, recommend the following: Help with stairs or ramp for entrance;Assist for transportation;Assistance with cooking/housework;A lot of help with bathing/dressing/bathroom;Two people to help with walking and/or transfers;Direct supervision/assist for medications management;Direct supervision/assist for financial management;Assistance with feeding   Can travel by private vehicle         Equipment Recommendations  BSC/3in1;Wheelchair (measurements PT);Wheelchair cushion (measurements PT);Hospital bed;Hoyer lift (air mattress; roho cushion)    Recommendations for Other Services       Precautions / Restrictions Precautions Precautions: Fall;Other (comment) Recall of Precautions/Restrictions: Intact Precaution/Restrictions Comments: orthostatic, pressure injury on coccyx, bil TED hose, abdominal binder, watch HR Restrictions Weight Bearing Restrictions Per Provider Order: No     Mobility  Bed Mobility Overal bed mobility: Needs Assistance Bed Mobility: Supine to Sit, Sit to Supine     Supine to sit: Max assist, +2 for physical assistance, +2 for safety/equipment, HOB elevated Sit to supine: Total assist, +2 for physical assistance, +2 for safety/equipment   General bed mobility comments: Cues provided for pt to bring bil legs off L EOB with poor initiation noted by pt. Cues also provided for pt to pull up on therapist's hands to ascend trunk. Pt needed maxAx2 to manage trunk and legs supine to sit L EOB. Total assist x2 needed to manage trunk and legs with return to supine and with superior transition in bed.    Transfers                   General transfer comment: deferred due to increased lethargy sitting EOB and decreased clarity of speech with BP noted to drop even with thigh high TED hose and SCDs donned along with ACE wrap around abdomen (no abdominal binder present)    Ambulation/Gait               General Gait Details: deferred due to increased lethargy sitting EOB and decreased clarity of speech with BP noted to drop even with thigh high TED hose and SCDs donned along with ACE wrap around abdomen (  no abdominal binder present)   Stairs             Wheelchair Mobility     Tilt Bed    Modified Rankin (Stroke Patients Only) Modified Rankin (Stroke Patients Only) Pre-Morbid Rankin Score: No symptoms Modified Rankin: Severe  disability     Balance Overall balance assessment: Needs assistance Sitting-balance support: Feet supported, Single extremity supported, Bilateral upper extremity supported Sitting balance-Leahy Scale: Poor Sitting balance - Comments: L lateral lean, needing CGA-minA for static sitting balance. Pt keeps his neck flexed and head down, needing maxA to extend and look up.       Standing balance comment: deferred due to increased lethargy sitting EOB and decreased clarity of speech with BP noted to drop even with thigh high TED hose and SCDs donned along with ACE wrap around abdomen (no abdominal binder present)                            Communication Communication Communication: Impaired Factors Affecting Communication: Reduced clarity of speech (when sitting EOB, soft spoken throughout)  Cognition Arousal: Lethargic Behavior During Therapy: Flat affect   PT - Cognitive impairments: Awareness                       PT - Cognition Comments: After multiple attempts, pt aroused to Mr. Siemon, he was able to keep eyes open but became increasingly lethargic sitting EOB. Oriented to self and day of week, but difficult to understand pt's answer to where he currently is. Follows simple multi-modal cues ~50% of time, delayed processing and poor initiation. Following commands: Impaired Following commands impaired: Follows one step commands with increased time, Follows one step commands inconsistently    Cueing Cueing Techniques: Verbal cues, Tactile cues, Gestural cues  Exercises General Exercises - Lower Extremity Long Arc Quad: AROM, Both, Seated, Strengthening, 20 reps, 10 reps (pt moved R more than L, R > 20x, L ~10x; unable to lift >50% AROM against gravity bil)    General Comments General comments (skin integrity, edema, etc.): HR 110s-132; BP with bil thigh-high TED hose and SCDs donned - 115/78 supine, 112/62 initially sitting EOB, 96/59 several minutes sitting  EOB with ACE wraps applied around abdomen, 115/69 supine end of session      Pertinent Vitals/Pain Pain Assessment Pain Assessment: Faces Faces Pain Scale: Hurts a little bit Pain Location: generalized Pain Descriptors / Indicators: Grimacing Pain Intervention(s): Limited activity within patient's tolerance, Monitored during session, Repositioned    Home Living                          Prior Function            PT Goals (current goals can now be found in the care plan section) Acute Rehab PT Goals Patient Stated Goal: to improve PT Goal Formulation: With patient/family Time For Goal Achievement: 04/03/24 Potential to Achieve Goals: Fair Progress towards PT goals: Not progressing toward goals - comment (functional decline with increased lethargy)    Frequency    Min 3X/week      PT Plan      Co-evaluation   Reason for Co-Treatment: Complexity of the patient's impairments (multi-system involvement);Necessary to address cognition/behavior during functional activity;For patient/therapist safety;To address functional/ADL transfers;Other (comment) (lethargic) PT goals addressed during session: Mobility/safety with mobility;Balance;Strengthening/ROM OT goals addressed during session: ADL's and self-care      AM-PAC  PT 6 Clicks Mobility   Outcome Measure  Help needed turning from your back to your side while in a flat bed without using bedrails?: A Lot Help needed moving from lying on your back to sitting on the side of a flat bed without using bedrails?: Total Help needed moving to and from a bed to a chair (including a wheelchair)?: Total Help needed standing up from a chair using your arms (e.g., wheelchair or bedside chair)?: Total Help needed to walk in hospital room?: Total Help needed climbing 3-5 steps with a railing? : Total 6 Click Score: 7    End of Session   Activity Tolerance: Treatment limited secondary to medical complications  (Comment);Patient limited by lethargy (limited by orthostatic hypotension) Patient left: in bed;with call bell/phone within reach;with family/visitor present Nurse Communication: Mobility status;Other (comment) (vitals) PT Visit Diagnosis: Unsteadiness on feet (R26.81);Other abnormalities of gait and mobility (R26.89);Muscle weakness (generalized) (M62.81);Difficulty in walking, not elsewhere classified (R26.2)     Time: 8950-8879 PT Time Calculation (min) (ACUTE ONLY): 31 min  Charges:    $Therapeutic Activity: 8-22 mins PT General Charges $$ ACUTE PT VISIT: 1 Visit                     Theo Ferretti, PT, DPT Acute Rehabilitation Services  Office: [phone]    Theo CHRISTELLA Ferretti 03/25/2024, 11:35 AM

## 2024-03-25 NOTE — TOC Progression Note (Signed)
 Transition of Care [facility]) - Progression Note    Patient Details  Name: Paul VEZINA, MD MRN: [id number] Date of Birth: [birth year]  Transition of Care [facility] CM/SW Contact  Andrez JULIANNA George, RN Phone Number: 03/25/2024, 11:09 AM  Clinical Narrative:     Plan: to transfer to [facility]. Awaiting bed availability at [facility]. IP Care management following.  Expected Discharge Plan: IP Rehab Facility                 Expected Discharge Plan and Services                                               Social Drivers of Health (SDOH) Interventions SDOH Screenings   Food Insecurity: No Food Insecurity (03/19/2024)  Housing: Low Risk  (03/19/2024)  Recent Concern: Housing - High Risk (01/23/2024)   Received from [facility]  Transportation Needs: No Transportation Needs (03/19/2024)  Utilities: Not At Risk (03/19/2024)  Depression (PHQ2-9): Low Risk  (07/20/2019)  Financial Resource Strain: Low Risk  (03/12/2024)   Received from [facility]  Physical Activity: Inactive (01/23/2024)   Received from [facility]  Social Connections: Socially Integrated (03/19/2024)  Stress: No Stress Concern Present (01/23/2024)   Received from [facility]  Tobacco Use: Low Risk  (03/19/2024)  Health Literacy: Low Risk  (03/19/2023)   Received from [facility]    Readmission Risk Interventions     No data to display

## 2024-03-25 NOTE — Progress Notes (Signed)
   Inpatient Rehabilitation Admissions Coordinator   Noted continued plans to transfer to [facility]. We will sign off as he is not at a level to pursue CIR level rehab.  Heron Leavell, RN, MSN Rehab Admissions Coordinator [phone] 03/25/2024 11:58 AM

## 2024-03-26 DIAGNOSIS — C9200 Acute myeloblastic leukemia, not having achieved remission: Secondary | ICD-10-CM | POA: Diagnosis not present

## 2024-03-26 DIAGNOSIS — S065XAA Traumatic subdural hemorrhage with loss of consciousness status unknown, initial encounter: Secondary | ICD-10-CM | POA: Diagnosis not present

## 2024-03-26 LAB — GLUCOSE, CAPILLARY
Glucose-Capillary: 131 mg/dL — ABNORMAL HIGH (ref 70–99)
Glucose-Capillary: 148 mg/dL — ABNORMAL HIGH (ref 70–99)
Glucose-Capillary: 155 mg/dL — ABNORMAL HIGH (ref 70–99)
Glucose-Capillary: 162 mg/dL — ABNORMAL HIGH (ref 70–99)
Glucose-Capillary: 169 mg/dL — ABNORMAL HIGH (ref 70–99)
Glucose-Capillary: 173 mg/dL — ABNORMAL HIGH (ref 70–99)

## 2024-03-26 LAB — CBC WITH DIFFERENTIAL/PLATELET
Abs Immature Granulocytes: 4.71 K/uL — ABNORMAL HIGH (ref 0.00–0.07)
Basophils Absolute: 0.1 K/uL (ref 0.0–0.1)
Basophils Relative: 0 %
Eosinophils Absolute: 0 K/uL (ref 0.0–0.5)
Eosinophils Relative: 0 %
HCT: 25.3 % — ABNORMAL LOW (ref 39.0–52.0)
Hemoglobin: 8.3 g/dL — ABNORMAL LOW (ref 13.0–17.0)
Immature Granulocytes: 21 %
Lymphocytes Relative: 9 %
Lymphs Abs: 2 K/uL (ref 0.7–4.0)
MCH: 29.3 pg (ref 26.0–34.0)
MCHC: 32.8 g/dL (ref 30.0–36.0)
MCV: 89.4 fL (ref 80.0–100.0)
Monocytes Absolute: 9 K/uL — ABNORMAL HIGH (ref 0.1–1.0)
Monocytes Relative: 38 %
Neutro Abs: 7.2 K/uL (ref 1.7–7.7)
Neutrophils Relative %: 32 %
Platelets: 18 K/uL — CL (ref 150–400)
RBC: 2.83 MIL/uL — ABNORMAL LOW (ref 4.22–5.81)
RDW: 15.8 % — ABNORMAL HIGH (ref 11.5–15.5)
WBC: 22.9 K/uL — ABNORMAL HIGH (ref 4.0–10.5)
nRBC: 0 % (ref 0.0–0.2)

## 2024-03-26 LAB — BASIC METABOLIC PANEL WITH GFR
Anion gap: 17 — ABNORMAL HIGH (ref 5–15)
BUN: 28 mg/dL — ABNORMAL HIGH (ref 8–23)
CO2: 21 mmol/L — ABNORMAL LOW (ref 22–32)
Calcium: 7.5 mg/dL — ABNORMAL LOW (ref 8.9–10.3)
Chloride: 98 mmol/L (ref 98–111)
Creatinine, Ser: 1.66 mg/dL — ABNORMAL HIGH (ref 0.61–1.24)
GFR, Estimated: 42 mL/min — ABNORMAL LOW (ref >60)
Glucose, Bld: 164 mg/dL — ABNORMAL HIGH (ref 70–99)
Potassium: 3.5 mmol/L (ref 3.5–5.1)
Sodium: 136 mmol/L (ref 135–145)

## 2024-03-26 LAB — PREPARE PLATELET PHERESIS
Unit division: 0
Unit division: 0

## 2024-03-26 LAB — BPAM PLATELET PHERESIS
Blood Product Expiration Date: 202510122359
Blood Product Expiration Date: 202510122359
ISSUE DATE / TIME: 202510090841
ISSUE DATE / TIME: 202510091127
Unit Type and Rh: 5100
Unit Type and Rh: 6200

## 2024-03-26 LAB — MAGNESIUM: Magnesium: 1.7 mg/dL (ref 1.7–2.4)

## 2024-03-26 MED ORDER — SODIUM CHLORIDE 0.9% IV SOLUTION: Freq: Once | INTRAVENOUS | Status: AC

## 2024-03-26 NOTE — Progress Notes (Signed)
 Physical Therapy Treatment Patient Details Name: Paul GAMBLIN, MD MRN: [id number] DOB: 15-Sep-1945 Today's Date: 03/26/2024   History of Present Illness Pt is a 78 y.o. male presenting 10/3 with progressive generalized weakness and shortness of breath over the past 2 weeks. Found to have acute on subacute bilateral subdural hematomas in the setting of his known severe thrombocytopenia. Admitted for neuro eval. PMH: DM, OSA, chronic systolic CHF with AICD placement, hypogonadism, HTN, stage 3 CKD, TKR bil, TIA, acute myeloid leukemia undergoing treatment at [facility], orthostatic hypotension, CAD with PCI    PT Comments  The pt is making gradual progress. He was more alert today, but is confused as he is disoriented to the date and repeatedly asked the purpose of the TED hose. His BP also remained more stable today, allowing him to safely progress OOB to the chair today, see measurements below. While he was able to hold his head up better today, he maintains a kyphotic posture. He needed maxA to perform bed mobility, modA to transfer to stand, and minA to take pivotal steps with a RW. He remains at risk for falls due to his hx of orthostatic hypotension along with his deficits in strength, power, cognition, and endurance. Will continue to follow acutely. Requested nursing secretary to order abdominal binder and geomat for pt.   BP with bil thigh-high TED hose and SCDs donned -  116/68 (82) supine 127/45 (66) sitting EOB with ACE wrap applied around abdomen 107/55 (69) sitting after standing with ACE wrap applied around abdomen 114/61 (77) reclined end of session      If plan is discharge home, recommend the following: Help with stairs or ramp for entrance;Assist for transportation;Assistance with cooking/housework;A lot of help with bathing/dressing/bathroom;Two people to help with walking and/or transfers;Direct supervision/assist for medications management;Direct supervision/assist for financial  management;Assistance with feeding   Can travel by private vehicle        Equipment Recommendations  BSC/3in1;Wheelchair (measurements PT);Wheelchair cushion (measurements PT);Hospital bed;Hoyer lift (air mattress; roho cushion)    Recommendations for Other Services       Precautions / Restrictions Precautions Precautions: Fall;Other (comment) Recall of Precautions/Restrictions: Intact Precaution/Restrictions Comments: orthostatic, pressure injury on coccyx, bil TED hose, abdominal binder, watch HR Restrictions Weight Bearing Restrictions Per Provider Order: No     Mobility  Bed Mobility Overal bed mobility: Needs Assistance Bed Mobility: Supine to Sit     Supine to sit: Max assist, HOB elevated, Used rails     General bed mobility comments: Cues provided for pt to bring bil legs off R EOB with poor initiation noted by pt. Cues also provided for pt to pull up on therapist's hands to ascend trunk. Pt needed maxA to manage trunk and legs supine to sit R EOB.    Transfers Overall transfer level: Needs assistance Equipment used: Rolling walker (2 wheels) Transfers: Sit to/from Stand, Bed to chair/wheelchair/BSC Sit to Stand: Mod assist   Step pivot transfers: Min assist       General transfer comment: Pt needed cues for hand placement. ModA to power up to stand, extend hips, and gain balance. MinA needed for balance during step pivot to R from EOB to recliner.    Ambulation/Gait Ambulation/Gait assistance: Min assist Gait Distance (Feet): 1 Feet Assistive device: Rolling walker (2 wheels) Gait Pattern/deviations: Step-to pattern, Decreased step length - right, Decreased step length - left, Decreased stride length, Trunk flexed Gait velocity: reduced Gait velocity interpretation: <1.31 ft/sec, indicative of household ambulator   General  Gait Details: Pt takes slow, small steps to step pivot to R from bed to chair. MinA for balance. Limited distance for pt safety due to  hx of orthostatic hypotension and wanting to progres pt gradually for safety considering his medical condition.   Stairs             Wheelchair Mobility     Tilt Bed    Modified Rankin (Stroke Patients Only) Modified Rankin (Stroke Patients Only) Pre-Morbid Rankin Score: No symptoms Modified Rankin: Moderately severe disability     Balance Overall balance assessment: Needs assistance Sitting-balance support: Feet supported, Single extremity supported, Bilateral upper extremity supported Sitting balance-Leahy Scale: Poor Sitting balance - Comments: R lateral lean initially, needing minA but then progressing to CGA for safety with pt using hands for support. Improved ability to hold his head up this date   Standing balance support: Bilateral upper extremity supported, Reliant on assistive device for balance, During functional activity Standing balance-Leahy Scale: Poor Standing balance comment: reliant on RW and minA                            Communication Communication Communication: Impaired Factors Affecting Communication: Other (comment) (soft spoken at times)  Cognition Arousal: Alert Behavior During Therapy: Flat affect   PT - Cognitive impairments: Awareness, Orientation, Memory, Initiation, Sequencing   Orientation impairments: Time                   PT - Cognition Comments: Pt more alert today. He was disoriented to date, believing it was September. Pt slow to process cues and initiate movement, needing step-by-step cues to sequence mobility. Pt seeming to forget purpose of TED hose even though educated pt several times on it and pt is a retired MD. Following commands: Impaired Following commands impaired: Follows one step commands with increased time, Follows one step commands inconsistently    Cueing Cueing Techniques: Verbal cues, Tactile cues, Gestural cues  Exercises General Exercises - Lower Extremity Long Arc Quad: AROM, Both,  Seated, Strengthening, 10 reps (improved initiation bil today, intermittently not reaching full range as he fatigued)    General Comments General comments (skin integrity, edema, etc.): BP with bil thigh-high TED hose and SCDs donned - 116/68 (82) supine, 127/45 (66) sitting EOB with ACE wrap applied around abdomen, 107/55 (69) sitting after standing with ACE wrap applied around abdomen, 114/61 (77) reclined end of session      Pertinent Vitals/Pain Pain Assessment Pain Assessment: Faces Faces Pain Scale: Hurts a little bit Pain Location: generalized Pain Descriptors / Indicators: Grimacing Pain Intervention(s): Limited activity within patient's tolerance, Monitored during session, Repositioned    Home Living                          Prior Function            PT Goals (current goals can now be found in the care plan section) Acute Rehab PT Goals Patient Stated Goal: to improve PT Goal Formulation: With patient Time For Goal Achievement: 04/03/24 Potential to Achieve Goals: Fair Progress towards PT goals: Progressing toward goals    Frequency    Min 3X/week      PT Plan      Co-evaluation              AM-PAC PT 6 Clicks Mobility   Outcome Measure  Help needed turning from your back to your  side while in a flat bed without using bedrails?: A Lot Help needed moving from lying on your back to sitting on the side of a flat bed without using bedrails?: A Lot Help needed moving to and from a bed to a chair (including a wheelchair)?: A Little Help needed standing up from a chair using your arms (e.g., wheelchair or bedside chair)?: A Lot Help needed to walk in hospital room?: Total Help needed climbing 3-5 steps with a railing? : Total 6 Click Score: 11    End of Session Equipment Utilized During Treatment: Gait belt Activity Tolerance: Patient limited by fatigue;Patient tolerated treatment well Patient left: with call bell/phone within reach;in  chair;with chair alarm set Nurse Communication: Mobility status;Other (comment) (vitals) PT Visit Diagnosis: Unsteadiness on feet (R26.81);Other abnormalities of gait and mobility (R26.89);Muscle weakness (generalized) (M62.81);Difficulty in walking, not elsewhere classified (R26.2)     Time: 9063-8995 PT Time Calculation (min) (ACUTE ONLY): 28 min  Charges:    $Therapeutic Activity: 23-37 mins PT General Charges $$ ACUTE PT VISIT: 1 Visit                     Theo Ferretti, PT, DPT Acute Rehabilitation Services  Office: [phone]    Theo CHRISTELLA Ferretti 03/26/2024, 12:04 PM

## 2024-03-26 NOTE — Progress Notes (Signed)
 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  Workup in ED included CT PE protocol which was negative for PE.  Head CT was obtained due to complaint of headache and showed bilateral acute/subacute subdural hematomas.   The case was discussed with Dr. Zewdu who noted no need to transfer to [facility] (see 03/19/2024 note).  He was admitted to the ICU.  Neurosurgery was consulted who recommended supportive care.  Serial head CT's have shown stable subdural hematomas.  Case then discussed with Dr. Zeidner 10/5 who recommends transfer to [facility] for further care.     No beds available at [facility].  Currently transfusing as needed for thrombocytopenia.  Working on transfer to [facility] when able.  [facility] at capacity 10/5, 10/6, 10/7, 10/8, 10/9, 10/10.  Will continue to follow with [facility]. He's accepted contingent on bed availability and room on heme/onc service.    Assessment & Plan:  Acute/Subacute Bilateral Subdural Hematomas - CT with mixed density bilateral convexity extra axial hemorrhages with bilateral sulcal effacement and no significant midline shift on presentation 10/3 - CT 10/4 unchanged bilateral subdurals  - CT, 10/5 with R>L unchanged bilateral subdural hematoms - Neurosurgery recommending continuing supportive care, outpatient monitoring of SDH - Keppra 500 mg BID for seizure prophylaxis  - Transfuse platelets for goal >50,000    AML  Severe Thrombocytopenia  Anemia - Follows with [facility] outpatienr, Dr.Zeidner  - Discussed with Dr. Onesimo here who recommended transfuse to platelet goal >50,000 in setting of above - Discussed with Dr. Carolan, who recommended transfer to [facility] as well  - Fibrinogen  wnl  - Transfuse for Hb < 8 or PLTC <  50k in setting of SDH - Hydroxyurea  resumed 10/9 since WBC > 10k - study drug has been resumed per wife after hearing from [facility]  Acute Metabolic Encephalopathy Generalized Weakness - VBG without hypercarbia - Ammonia wnl  - Seems to be improving, related to acute hospitalization and pain meds - Delirium precautions   Fever - no neutropenia; possibly central fever vs from AML - 10/6 blood cultures negative to date - UA not c/w UTI - CXR without acute abnormality   HFmrEF PACs  PVCs  Atrial Flutter with RVR - Mexiletine, metoprolol  - Echo 02/2024 with EF 50-55% - Not an anticoagulation candidate in setting of SDH - Rate controlled on metoprolol  currently   AKI on CKD IIIa Improving, 1.76 on presentation  Baseline appears to be 1.2-1.3 (as recently as 10/1) -Renal ultrasound negative for hydronephrosis; mostly consistent with chronic medical renal disease - at risk for dehydration with poor intake; continue IVF for now until eating better   Hypokalemia  Hypomagnesemia  Replete as needed   Orthostatic Hypotension Noted, continue to monitor SBP dropped to 70's 10/6 - follow closely Compression stockings, abdominal binder, IVF (as necessary)  T2DM SSI   BPH Flomax   Interval History:  No events overnight.  Wife present bedside.  He is actually more awake and alert this morning.  Talking easily.  Other visitors bedside as well. Called [facility] this morning and this afternoon.  Still no beds.   Old records reviewed in assessment of this patient  Antimicrobials:   DVT prophylaxis:  SCDs  Start: 03/19/24 1858   Code Status:   Code Status: Limited: Do not attempt resuscitation (DNR) -DNR-LIMITED -Do Not Intubate/DNI   Mobility Assessment (Last 72 Hours)     Mobility Assessment     Row Name 03/26/24 1137 03/26/24 1129 03/25/24 1942 03/25/24 1400 03/25/24 1100   Does the patient have exclusion criteria? -- Yes- Hold (Level 0) - Assessment complete Yes- Hold (Level 0) -  Assessment complete -- --   What is the highest level of mobility based on the mobility assessment? Level 2 (Chairfast) - Balance while sitting on edge of bed and cannot stand -- -- Level 2 (Chairfast) - Balance while sitting on edge of bed and cannot stand Level 2 (Chairfast) - Balance while sitting on edge of bed and cannot stand    Row Name 03/25/24 0800 03/24/24 2000 03/24/24 0800 03/23/24 2000 03/23/24 1521   Does the patient have exclusion criteria? Yes - Bedfast (Level 1) - Select exclusion criteria in next row Yes - Bedfast (Level 1) - Select exclusion criteria in next row Yes - Bedfast (Level 1) - Select exclusion criteria in next row Yes - Bedfast (Level 1) - Select exclusion criteria in next row --   Mobility Assessment Exclusion Criteria -- No exclusion criteria present, perform mobility assessment -- No exclusion criteria present, perform mobility assessment --   What is the highest level of mobility based on the mobility assessment? Level 1 (Bedfast) - Unable to balance while sitting on edge of bed Level 3 (Stands with assistance) - Balance while standing  and cannot march in place Level 1 (Bedfast) - Unable to balance while sitting on edge of bed Level 3 (Stands with assistance) - Balance while standing  and cannot march in place Level 3 (Stands with assistance) - Balance while standing  and cannot march in place   Is the above level different from baseline mobility prior to current illness? Yes - Recommend PT order Yes - Recommend PT order Yes - Recommend PT order Yes - Recommend PT order --      Barriers to discharge:  Disposition Plan: TBD Status is: Inpatient  Objective: Blood pressure (!) 100/57, pulse 94, temperature 97.7 F (36.5 C), temperature source Oral, resp. rate 18, height 5' 10 (1.778 m), weight 83 kg, SpO2 97%.  Examination:  Physical Exam Constitutional:      Comments: More awake and alert this morning.  Conversational and easily following commands.  HENT:      Head: Normocephalic and atraumatic.     Mouth/Throat:     Mouth: Mucous membranes are moist.  Eyes:     Extraocular Movements: Extraocular movements intact.  Cardiovascular:     Rate and Rhythm: Normal rate and regular rhythm.  Pulmonary:     Effort: Pulmonary effort is normal. No respiratory distress.     Breath sounds: Normal breath sounds. No wheezing.  Abdominal:     General: Bowel sounds are normal. There is no distension.     Palpations: Abdomen is soft.     Tenderness: There is no abdominal tenderness.  Musculoskeletal:        General: Normal range of motion.     Cervical back: Normal range of motion and neck supple.  Skin:    General: Skin is warm and dry.  Neurological:     Comments: Moving all 4 extremities and following commands      Consultants:  Hematology/oncology  Procedures:    Data Reviewed: Results for orders placed or performed during the  hospital encounter of 03/19/24 (from the past 24 hours)  CBC with Differential/Platelet     Status: Abnormal   Collection Time: 03/25/24  3:12 PM  Result Value Ref Range   WBC 19.7 (H) 4.0 - 10.5 K/uL   RBC 2.68 (L) 4.22 - 5.81 MIL/uL   Hemoglobin 7.9 (L) 13.0 - 17.0 g/dL   HCT 76.1 (L) 60.9 - 47.9 %   MCV 88.8 80.0 - 100.0 fL   MCH 29.5 26.0 - 34.0 pg   MCHC 33.2 30.0 - 36.0 g/dL   RDW 84.1 (H) 88.4 - 84.4 %   Platelets 47 (L) 150 - 400 K/uL   nRBC 0.0 0.0 - 0.2 %   Neutrophils Relative % 26 %   Neutro Abs 5.1 1.7 - 7.7 K/uL   Lymphocytes Relative 9 %   Lymphs Abs 1.7 0.7 - 4.0 K/uL   Monocytes Relative 51 %   Monocytes Absolute 10.1 (H) 0.1 - 1.0 K/uL   Eosinophils Relative 0 %   Eosinophils Absolute 0.0 0.0 - 0.5 K/uL   Basophils Relative 0 %   Basophils Absolute 0.1 0.0 - 0.1 K/uL   WBC Morphology See Note    RBC Morphology MORPHOLOGY UNREMARKABLE    Smear Review Normal platelet morphology    Immature Granulocytes 14 %   Abs Immature Granulocytes 2.71 (H) 0.00 - 0.07 K/uL  Glucose, capillary      Status: Abnormal   Collection Time: 03/25/24  3:31 PM  Result Value Ref Range   Glucose-Capillary 155 (H) 70 - 99 mg/dL   Comment 1 Notify RN    Comment 2 Document in Chart   Glucose, capillary     Status: Abnormal   Collection Time: 03/25/24  7:55 PM  Result Value Ref Range   Glucose-Capillary 123 (H) 70 - 99 mg/dL   Comment 1 Notify RN   Glucose, capillary     Status: Abnormal   Collection Time: 03/26/24 12:12 AM  Result Value Ref Range   Glucose-Capillary 155 (H) 70 - 99 mg/dL   Comment 1 Notify RN   Glucose, capillary     Status: Abnormal   Collection Time: 03/26/24  3:51 AM  Result Value Ref Range   Glucose-Capillary 162 (H) 70 - 99 mg/dL   Comment 1 Notify RN   Basic metabolic panel with GFR     Status: Abnormal   Collection Time: 03/26/24  7:00 AM  Result Value Ref Range   Sodium 136 135 - 145 mmol/L   Potassium 3.5 3.5 - 5.1 mmol/L   Chloride 98 98 - 111 mmol/L   CO2 21 (L) 22 - 32 mmol/L   Glucose, Bld 164 (H) 70 - 99 mg/dL   BUN 28 (H) 8 - 23 mg/dL   Creatinine, Ser 8.33 (H) 0.61 - 1.24 mg/dL   Calcium  7.5 (L) 8.9 - 10.3 mg/dL   GFR, Estimated 42 (L) >60 mL/min   Anion gap 17 (H) 5 - 15  CBC with Differential/Platelet     Status: Abnormal   Collection Time: 03/26/24  7:00 AM  Result Value Ref Range   WBC 22.9 (H) 4.0 - 10.5 K/uL   RBC 2.83 (L) 4.22 - 5.81 MIL/uL   Hemoglobin 8.3 (L) 13.0 - 17.0 g/dL   HCT 74.6 (L) 60.9 - 47.9 %   MCV 89.4 80.0 - 100.0 fL   MCH 29.3 26.0 - 34.0 pg   MCHC 32.8 30.0 - 36.0 g/dL   RDW 84.1 (H) 88.4 - 84.4 %  Platelets 18 (LL) 150 - 400 K/uL   nRBC 0.0 0.0 - 0.2 %   Neutrophils Relative % 32 %   Neutro Abs 7.2 1.7 - 7.7 K/uL   Lymphocytes Relative 9 %   Lymphs Abs 2.0 0.7 - 4.0 K/uL   Monocytes Relative 38 %   Monocytes Absolute 9.0 (H) 0.1 - 1.0 K/uL   Eosinophils Relative 0 %   Eosinophils Absolute 0.0 0.0 - 0.5 K/uL   Basophils Relative 0 %   Basophils Absolute 0.1 0.0 - 0.1 K/uL   WBC Morphology See Note    RBC  Morphology MORPHOLOGY UNREMARKABLE    Smear Review See Note    Immature Granulocytes 21 %   Abs Immature Granulocytes 4.71 (H) 0.00 - 0.07 K/uL  Magnesium      Status: None   Collection Time: 03/26/24  7:00 AM  Result Value Ref Range   Magnesium  1.7 1.7 - 2.4 mg/dL  Glucose, capillary     Status: Abnormal   Collection Time: 03/26/24  8:38 AM  Result Value Ref Range   Glucose-Capillary 148 (H) 70 - 99 mg/dL  Prepare platelet pheresis     Status: None (Preliminary result)   Collection Time: 03/26/24 10:34 AM  Result Value Ref Range   Unit Number [id number]    Blood Component Type PLTP2 PSORALEN TREATED    Unit division 00    Status of Unit ALLOCATED    Transfusion Status OK TO TRANSFUSE    Unit Number [id number]    Blood Component Type PLTP1 PSORALEN TREATED    Unit division 00    Status of Unit ALLOCATED    Transfusion Status OK TO TRANSFUSE   Glucose, capillary     Status: Abnormal   Collection Time: 03/26/24 12:07 PM  Result Value Ref Range   Glucose-Capillary 173 (H) 70 - 99 mg/dL    I have reviewed pertinent nursing notes, vitals, labs, and images as necessary. I have ordered labwork to follow up on as indicated.  I have reviewed the last notes from staff over past 24 hours. I have discussed patient's care plan and test results with nursing staff, CM/SW, and other staff as appropriate.  Time spent: Greater than 50% of the 55 minute visit was spent in counseling/coordination of care for the patient as laid out in the A&P.   LOS: 7 days   Alm Apo, MD [facility] 03/26/2024, 2:26 PM

## 2024-03-26 NOTE — Progress Notes (Signed)
   03/26/24 1140  Spiritual Encounters  Type of Visit Initial  Care provided to: Pt and family  Reason for visit Advance directives  OnCall Visit No  Spiritual Framework  Presenting Themes Impactful experiences and emotions  Community/Connection Family  Patient Stress Factors Health changes  Family Stress Factors Health changes  Interventions  Spiritual Care Interventions Made Compassionate presence;Established relationship of care and support;Prayer  Intervention Outcomes  Outcomes Awareness of health;Awareness of support;Reduced anxiety   Chaplain went to visit Pt to discuss Advance Directive. Family member inquiring about a Engineer, agricultural.  Chaplain informed the Pt and family that [facility] does not process Durable POA documents, but the  hospital does provide a Healthcare POA as part of the Advance Directive process.  Chaplain provided education on the difference between the two and offered to assist completing the Healthcare POA if the Pt wished to proceed. Chaplain offered a word of prayer per the family request.

## 2024-03-26 NOTE — Plan of Care (Signed)

## 2024-03-27 ENCOUNTER — Inpatient Hospital Stay (HOSPITAL_COMMUNITY)

## 2024-03-27 DIAGNOSIS — D696 Thrombocytopenia, unspecified: Secondary | ICD-10-CM | POA: Diagnosis not present

## 2024-03-27 DIAGNOSIS — S065XAA Traumatic subdural hemorrhage with loss of consciousness status unknown, initial encounter: Secondary | ICD-10-CM | POA: Diagnosis not present

## 2024-03-27 DIAGNOSIS — C9200 Acute myeloblastic leukemia, not having achieved remission: Secondary | ICD-10-CM | POA: Diagnosis not present

## 2024-03-27 LAB — CBC WITH DIFFERENTIAL/PLATELET
Abs Immature Granulocytes: 3.25 K/uL — ABNORMAL HIGH (ref 0.00–0.07)
Abs Immature Granulocytes: 5.72 K/uL — ABNORMAL HIGH (ref 0.00–0.07)
Basophils Absolute: 0.1 K/uL (ref 0.0–0.1)
Basophils Absolute: 0.1 K/uL (ref 0.0–0.1)
Basophils Relative: 0 %
Basophils Relative: 0 %
Eosinophils Absolute: 0 K/uL (ref 0.0–0.5)
Eosinophils Absolute: 0 K/uL (ref 0.0–0.5)
Eosinophils Relative: 0 %
Eosinophils Relative: 0 %
HCT: 24.7 % — ABNORMAL LOW (ref 39.0–52.0)
HCT: 23.1 % — ABNORMAL LOW (ref 39.0–52.0)
Hemoglobin: 8.1 g/dL — ABNORMAL LOW (ref 13.0–17.0)
Hemoglobin: 7.7 g/dL — ABNORMAL LOW (ref 13.0–17.0)
Immature Granulocytes: 15 %
Immature Granulocytes: 22 %
Lymphocytes Relative: 8 %
Lymphocytes Relative: 8 %
Lymphs Abs: 1.8 K/uL (ref 0.7–4.0)
Lymphs Abs: 2.1 K/uL (ref 0.7–4.0)
MCH: 29 pg (ref 26.0–34.0)
MCH: 29.7 pg (ref 26.0–34.0)
MCHC: 32.8 g/dL (ref 30.0–36.0)
MCHC: 33.3 g/dL (ref 30.0–36.0)
MCV: 88.5 fL (ref 80.0–100.0)
MCV: 89.2 fL (ref 80.0–100.0)
Monocytes Absolute: 8.1 K/uL — ABNORMAL HIGH (ref 0.1–1.0)
Monocytes Absolute: 8.7 K/uL — ABNORMAL HIGH (ref 0.1–1.0)
Monocytes Relative: 38 %
Monocytes Relative: 33 %
Neutro Abs: 8.2 K/uL — ABNORMAL HIGH (ref 1.7–7.7)
Neutro Abs: 9.9 K/uL — ABNORMAL HIGH (ref 1.7–7.7)
Neutrophils Relative %: 39 %
Neutrophils Relative %: 37 %
Platelets: 21 K/uL — CL (ref 150–400)
Platelets: 47 K/uL — ABNORMAL LOW (ref 150–400)
RBC: 2.79 MIL/uL — ABNORMAL LOW (ref 4.22–5.81)
RBC: 2.59 MIL/uL — ABNORMAL LOW (ref 4.22–5.81)
RDW: 15.9 % — ABNORMAL HIGH (ref 11.5–15.5)
RDW: 16 % — ABNORMAL HIGH (ref 11.5–15.5)
WBC: 21.4 K/uL — ABNORMAL HIGH (ref 4.0–10.5)
WBC: 26.5 K/uL — ABNORMAL HIGH (ref 4.0–10.5)
nRBC: 0 % (ref 0.0–0.2)
nRBC: 0 % (ref 0.0–0.2)

## 2024-03-27 LAB — URINALYSIS, COMPLETE (UACMP) WITH MICROSCOPIC
Bilirubin Urine: NEGATIVE
Glucose, UA: >=500 mg/dL — AB
Hgb urine dipstick: NEGATIVE
Ketones, ur: 15 mg/dL — AB
Leukocytes,Ua: NEGATIVE
Nitrite: NEGATIVE
Protein, ur: NEGATIVE mg/dL
Specific Gravity, Urine: 1.015 (ref 1.005–1.030)
pH: 5.5 (ref 5.0–8.0)

## 2024-03-27 LAB — CULTURE, BLOOD (ROUTINE X 2)
Culture: NO GROWTH
Culture: NO GROWTH
Special Requests: ADEQUATE
Special Requests: ADEQUATE

## 2024-03-27 LAB — PREPARE PLATELET PHERESIS
Unit division: 0
Unit division: 0

## 2024-03-27 LAB — BPAM PLATELET PHERESIS
Blood Product Expiration Date: 202510132359
Blood Product Expiration Date: 202510132359
ISSUE DATE / TIME: 202510101426
ISSUE DATE / TIME: 202510101456
Unit Type and Rh: 5100
Unit Type and Rh: 6200

## 2024-03-27 LAB — GLUCOSE, CAPILLARY
Glucose-Capillary: 182 mg/dL — ABNORMAL HIGH (ref 70–99)
Glucose-Capillary: 188 mg/dL — ABNORMAL HIGH (ref 70–99)
Glucose-Capillary: 203 mg/dL — ABNORMAL HIGH (ref 70–99)
Glucose-Capillary: 205 mg/dL — ABNORMAL HIGH (ref 70–99)
Glucose-Capillary: 216 mg/dL — ABNORMAL HIGH (ref 70–99)
Glucose-Capillary: 283 mg/dL — ABNORMAL HIGH (ref 70–99)

## 2024-03-27 LAB — HEPATIC FUNCTION PANEL
ALT: 10 U/L (ref 0–44)
AST: 12 U/L — ABNORMAL LOW (ref 15–41)
Albumin: 1.8 g/dL — ABNORMAL LOW (ref 3.5–5.0)
Alkaline Phosphatase: 76 U/L (ref 38–126)
Bilirubin, Direct: 0.3 mg/dL — ABNORMAL HIGH (ref 0.0–0.2)
Indirect Bilirubin: 1.1 mg/dL — ABNORMAL HIGH (ref 0.3–0.9)
Total Bilirubin: 1.4 mg/dL — ABNORMAL HIGH (ref 0.0–1.2)
Total Protein: 4.3 g/dL — ABNORMAL LOW (ref 6.5–8.1)

## 2024-03-27 LAB — LACTIC ACID, PLASMA: Lactic Acid, Venous: 1.2 mmol/L (ref 0.5–1.9)

## 2024-03-27 LAB — BASIC METABOLIC PANEL WITH GFR
Anion gap: 13 (ref 5–15)
BUN: 34 mg/dL — ABNORMAL HIGH (ref 8–23)
CO2: 22 mmol/L (ref 22–32)
Calcium: 7.3 mg/dL — ABNORMAL LOW (ref 8.9–10.3)
Chloride: 99 mmol/L (ref 98–111)
Creatinine, Ser: 1.6 mg/dL — ABNORMAL HIGH (ref 0.61–1.24)
GFR, Estimated: 44 mL/min — ABNORMAL LOW (ref >60)
Glucose, Bld: 201 mg/dL — ABNORMAL HIGH (ref 70–99)
Potassium: 3.2 mmol/L — ABNORMAL LOW (ref 3.5–5.1)
Sodium: 134 mmol/L — ABNORMAL LOW (ref 135–145)

## 2024-03-27 LAB — TYPE AND SCREEN
ABO/RH(D): A POS
Antibody Screen: NEGATIVE

## 2024-03-27 LAB — BRAIN NATRIURETIC PEPTIDE: B Natriuretic Peptide: 490.9 pg/mL — ABNORMAL HIGH (ref 0.0–100.0)

## 2024-03-27 LAB — PHOSPHORUS: Phosphorus: 4.1 mg/dL (ref 2.5–4.6)

## 2024-03-27 LAB — MAGNESIUM: Magnesium: 1.6 mg/dL — ABNORMAL LOW (ref 1.7–2.4)

## 2024-03-27 MED ORDER — POTASSIUM CHLORIDE CRYS ER 20 MEQ PO TBCR
40.0000 meq | EXTENDED_RELEASE_TABLET | Freq: Once | ORAL | Status: AC
  Administered 2024-03-27: 40 meq via ORAL
  Filled 2024-03-27: qty 2

## 2024-03-27 MED ORDER — VANCOMYCIN HCL IN DEXTROSE 1-5 GM/200ML-% IV SOLN
1000.0000 mg | INTRAVENOUS | Status: DC
  Administered 2024-03-28: 1000 mg via INTRAVENOUS
  Filled 2024-03-27 (×2): qty 200

## 2024-03-27 MED ORDER — VANCOMYCIN HCL 2000 MG/400ML IV SOLN
2000.0000 mg | Freq: Once | INTRAVENOUS | Status: AC
  Administered 2024-03-27: 2000 mg via INTRAVENOUS
  Filled 2024-03-27: qty 400

## 2024-03-27 MED ORDER — SODIUM CHLORIDE 0.9 % IV SOLN
2.0000 g | Freq: Two times a day (BID) | INTRAVENOUS | Status: DC
  Administered 2024-03-27 – 2024-03-28 (×4): 2 g via INTRAVENOUS
  Filled 2024-03-27 (×4): qty 12.5

## 2024-03-27 MED ORDER — VANCOMYCIN HCL IN DEXTROSE 1-5 GM/200ML-% IV SOLN
1000.0000 mg | INTRAVENOUS | Status: DC
  Filled 2024-03-27: qty 200

## 2024-03-27 MED ORDER — SODIUM CHLORIDE 0.9% IV SOLUTION: Freq: Once | INTRAVENOUS | Status: AC

## 2024-03-27 MED ORDER — POTASSIUM CHLORIDE 10 MEQ/100ML IV SOLN
10.0000 meq | INTRAVENOUS | Status: AC
  Administered 2024-03-27 (×4): 10 meq via INTRAVENOUS
  Filled 2024-03-27 (×4): qty 100

## 2024-03-27 MED ORDER — GUAIFENESIN-DM 100-10 MG/5ML PO SYRP
10.0000 mL | ORAL_SOLUTION | ORAL | Status: DC | PRN
  Administered 2024-03-27 (×2): 10 mL via ORAL
  Filled 2024-03-27 (×2): qty 10

## 2024-03-27 MED ORDER — METOPROLOL TARTRATE 12.5 MG HALF TABLET
12.5000 mg | ORAL_TABLET | Freq: Once | ORAL | Status: AC
  Administered 2024-03-27: 12.5 mg via ORAL
  Filled 2024-03-27: qty 1

## 2024-03-27 MED ORDER — MAGNESIUM SULFATE 2 GM/50ML IV SOLN
2.0000 g | Freq: Once | INTRAVENOUS | Status: AC
  Administered 2024-03-27: 2 g via INTRAVENOUS
  Filled 2024-03-27: qty 50

## 2024-03-27 MED ORDER — METOPROLOL TARTRATE 25 MG PO TABS
37.5000 mg | ORAL_TABLET | Freq: Two times a day (BID) | ORAL | Status: DC
  Administered 2024-03-27 – 2024-03-28 (×3): 37.5 mg via ORAL
  Filled 2024-03-27 (×3): qty 1

## 2024-03-27 MED ORDER — VANCOMYCIN HCL 2000 MG/400ML IV SOLN
2000.0000 mg | Freq: Once | INTRAVENOUS | Status: DC
  Filled 2024-03-27: qty 400

## 2024-03-27 MED ORDER — ACETAMINOPHEN 500 MG PO TABS
1000.0000 mg | ORAL_TABLET | Freq: Four times a day (QID) | ORAL | Status: DC | PRN
Start: 2024-03-27 — End: 2024-03-29
  Administered 2024-03-27: 1000 mg via ORAL
  Filled 2024-03-27: qty 2

## 2024-03-27 NOTE — Plan of Care (Signed)

## 2024-03-27 NOTE — Progress Notes (Signed)
 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  Pharmacy has been consulted for vancomycin dosing. PMH includes AML on valacyclovir , terbinafine , and voriconazole .  -WBC 21, sCr 1.6, Tmax 101 -CXR ordered  Plan: -Cefepime 2g IV every 12 hours per MD -Vancomycin 2g IV x1 -Vancomycin 1000mg  IV every 24 hours (AUC 439, Vd 0.72, IBW, sCr 1.6) -Monitor renal function -Follow up signs of clinical improvement, LOT, de-escalation of antibiotics   Height: 5' 10 (177.8 cm) Weight: 83 kg (182 lb 15.7 oz) IBW/kg (Calculated) : 73  Temp (24hrs), Avg:98.2 F (36.8 C), Min:97.4 F (36.3 C), Max:101 F (38.3 C)  Recent Labs  Lab 03/23/24 0552 03/24/24 0355 03/24/24 1002 03/24/24 1855 03/25/24 0535 03/25/24 1512 03/26/24 0700 03/27/24 0200 03/27/24 0210  WBC 8.2  --    < > 15.9* 16.2* 19.7* 22.9* 21.4*  --   CREATININE 1.72* 1.61*  --   --  1.52*  --  1.66* 1.60*  --   LATICACIDVEN  --   --   --   --   --   --   --   --  1.2   < > = values in this interval not displayed.    Estimated Creatinine Clearance: 40.6 mL/min (A) (by C-G formula based on SCr of 1.6 mg/dL (H)).    Allergies  Allergen Reactions   Lisinopril Other (See Comments), Shortness Of Breath, Swelling and Anaphylaxis    Angioedema  Angioedema    Angioedema    Antimicrobials this admission: Cefepime 10/11 >>  Vancomycin 10/11 >>   Microbiology results: 10/6 BCx: NGTD 10/11 Bcx: ordered 10/3 MRSA PCR: neg  Thank you for allowing pharmacy to be a part of this patient's care.  Lynwood Poplar, PharmD, BCPS Clinical Pharmacist 03/27/2024 4:08 AM

## 2024-03-27 NOTE — Progress Notes (Signed)
 Assessment 78 y/o M w/ AML, known b/l chronic SDH, thrombocytopenia Plt 21 with repeat imaging showing interval expansion of b/l chronic SDH although patient neurologically improved compared to a few days ago.   LOS: 8 days    Plan: No neurosurgical intervention warranted Would hold off on repeat imaging unless patient experiences a neurologic decline   Subjective: Pt only with minor headache  Objective: Vital signs in last 24 hours: Temp:  [97.4 F (36.3 C)-101 F (38.3 C)] 97.9 F (36.6 C) (10/11 0821) Pulse Rate:  [72-113] 98 (10/11 0821) Resp:  [18-22] 20 (10/11 0821) BP: (100-123)/(54-75) 104/63 (10/11 0821) SpO2:  [96 %-100 %] 98 % (10/11 0821) Weight:  [81.9 kg] 81.9 kg (10/11 0500)  Intake/Output from previous day: 10/10 0701 - 10/11 0700 In: 983 [P.O.:480; Blood:503] Out: 1600 [Urine:1600] Intake/Output this shift: Total I/O In: -  Out: 350 [Urine:350]  Exam: Awake, alert, conversational BUE and BLE full strength   Lab Results: Recent Labs    03/26/24 0700 03/27/24 0200  WBC 22.9* 21.4*  HGB 8.3* 8.1*  HCT 25.3* 24.7*  PLT 18* 21*   BMET Recent Labs    03/26/24 0700 03/27/24 0200  NA 136 134*  K 3.5 3.2*  CL 98 99  CO2 21* 22  GLUCOSE 164* 201*  BUN 28* 34*  CREATININE 1.66* 1.60*  CALCIUM  7.5* 7.3*    Studies/Results: CT HEAD WO CONTRAST ( ) Result Date: 03/27/2024 EXAM: CT HEAD WITHOUT CONTRAST 03/27/2024 09:33:26 AM TECHNIQUE: CT of the head was performed without the administration of intravenous contrast. Automated exposure control, iterative reconstruction, and/or weight based adjustment of the mA/kV was utilized to reduce the radiation dose to as low as reasonably achievable. COMPARISON: Head CT 03/21/2024 and earlier. CLINICAL HISTORY: 78 year old male, follow up SDH. FINDINGS: BRAIN AND VENTRICLES: Progressed right sided mixed density subdural hematoma maximal thickness now approximately 19 mm versus 13 mm at the same level earlier  this month. Contralateral smaller mixed density left subdural hematoma also is larger along the left posterior convexity, up to 13 mm thickness there now versus 7 mm at the same level earlier this month. Hypodense predominant blood products now although scattered intermediate and hyperdense blood also within both collections. Increased mass effect on both cerebral hemispheres. Increased leftward midline shift which is now 7 mm. Mass effect on the lateral ventricles. No ventriculomegaly. Stable basilar cistern patency. Mega cisterna magna. Stable gray white differentiation, no cerebral edema identified. No suspicious intracranial vascular hyperdensity. No evidence of acute infarct. ORBITS: No acute abnormality. SINUSES: Acute and chronic paranasal sinus inflammatory changes are stable, including right maxillary fluid level and periosteal thickening. SOFT TISSUES AND SKULL: No acute soft tissue abnormality. No skull fracture. IMPRESSION: 1. Progressed bilateral mixed density subdural hematoma: Right side 19 mm and Left side 13 mm. 2. Increased mass effect on both cerebral hemispheres and increased leftward midline shift of 7 mm. Electronically signed by: Helayne Hurst MD 03/27/2024 09:43 AM EDT RP Workstation: [id number]   DG CHEST PORT 1 VIEW Result Date: 03/27/2024 EXAM: 1 VIEW(S) XRAY OF THE CHEST 03/27/2024 06:15:00 AM COMPARISON: 03/22/2024 CLINICAL HISTORY: fever FINDINGS: LINES, TUBES AND DEVICES: Right chest port with tip at superior cavoatrial junction. LUNGS AND PLEURA: Low lung volumes. Increased bibasilar patchy opacities. Possible bilateral pleural effusions. No pneumothorax. HEART AND MEDIASTINUM: No acute abnormality of the cardiac and mediastinal silhouettes. BONES AND SOFT TISSUES: No acute osseous abnormality. IMPRESSION: 1. Increased bibasilar patchy opacities and possible bilateral pleural effusions. 2. Low  lung volumes. Electronically signed by: Waddell Calk MD 03/27/2024 07:26 AM EDT RP  Workstation: [id number]      Dorn JONELLE Glade 03/27/2024, 10:27 AM

## 2024-03-27 NOTE — Progress Notes (Signed)
 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  Workup in ED included CT PE protocol which was negative for PE.  Head CT was obtained due to complaint of headache and showed bilateral acute/subacute subdural hematomas.   The case was discussed with Dr. Zewdu who noted no need to transfer to [facility] (see 03/19/2024 note).  He was admitted to the ICU.  Neurosurgery was consulted who recommended supportive care.  Serial head CT's have shown stable subdural hematomas.  Case then discussed with Dr. Zeidner 10/5 who recommends transfer to [facility] for further care.     No beds available at [facility].  Currently transfusing as needed for thrombocytopenia.  Working on transfer to [facility] when able.  [facility] at capacity 10/5, 10/6, 10/7, 10/8, 10/9, 10/10, 10/11.  Will continue to follow with [facility]. He's accepted contingent on bed availability and room on heme/onc service.    Assessment & Plan:  Acute/Subacute Bilateral Subdural Hematomas - CT with mixed density bilateral convexity extra axial hemorrhages with bilateral sulcal effacement and no significant midline shift on presentation 10/3 - CT 10/4 unchanged bilateral subdurals  - CT, 10/5 with R>L unchanged bilateral subdural hematoms - Neurosurgery recommending continuing supportive care, outpatient monitoring of SDH - Keppra 500 mg BID for seizure prophylaxis  - Transfuse platelets for goal >50,000  - CTH repeated 10/11 for follow up.  Showing some progression of SDH (R increased from 13 mm to 19 mm and left increased from 7 mm to 13 mm, now with 7 mm left shift too) - still giving PLT in efforts to keep >50 k and will discuss case further with neurosurgery    AML  Severe Thrombocytopenia   Anemia - Follows with [facility] outpatienr, Dr.Zeidner  - Discussed with Dr. Onesimo here who recommended transfuse to platelet goal >50,000 in setting of above - Discussed with Dr. Carolan, who recommended transfer to [facility] as well  - Fibrinogen  wnl  - Transfuse for Hb < 8 or PLTC < 50k in setting of SDH - Hydroxyurea  resumed 10/9 since WBC > 10k - study drug has been resumed per wife after hearing from [facility]  Acute Metabolic Encephalopathy Generalized Weakness - VBG without hypercarbia - Ammonia wnl  - Seems to be improving; multifactorial with SDH, AML, deconditioning, ~FTT   Fever - no neutropenia; possibly central fever vs from AML vs infection - 10/6 blood cultures negative to date - UA not c/w UTI - CXR without acute abnormality - recurrent fever 03/26/24 @101  F and started on abx overnight; will continue for now   HFmrEF PACs  PVCs  Atrial Flutter with RVR - Mexiletine, metoprolol  - Echo 02/2024 with EF 50-55% - Not an anticoagulation candidate in setting of SDH - Rate controlled on metoprolol  currently   AKI on CKD IIIa Improving, 1.76 on presentation  Baseline appears to be 1.2-1.3 (as recently as 10/1) -Renal ultrasound negative for hydronephrosis; mostly consistent with chronic medical renal disease - at risk for dehydration with poor intake; continue IVF for now until eating better   Hypokalemia  Hypomagnesemia  Replete as needed   Orthostatic Hypotension Noted, continue to monitor SBP dropped to 70's 10/6 - follow closely Compression stockings, abdominal binder,  IVF (as necessary)  T2DM SSI   BPH Flomax   Interval History:   Multiple events overnight.  Developed some RVR in setting of his known A-fib but responded to metoprolol . Also developed fever and there was concern for potential platelet reaction.  Do not think this is the case and discussed with lab this morning also.  He was started on empiric antibiotics in addition.  Repeated his head CT this morning  which showed interval progression of underlying subdural hematomas.  Neurosurgery informed.  More platelets were also ordered given ongoing severe thrombocytopenia.   Old records reviewed in assessment of this patient  Antimicrobials:   DVT prophylaxis:  SCDs Start: 03/19/24 1858   Code Status:   Code Status: Limited: Do not attempt resuscitation (DNR) -DNR-LIMITED -Do Not Intubate/DNI   Mobility Assessment (Last 72 Hours)     Mobility Assessment     Row Name 03/27/24 0758 03/26/24 2041 03/26/24 1137 03/26/24 1129 03/25/24 1942   Does the patient have exclusion criteria? Yes- Hold (Level 0) - Assessment complete Yes- Hold (Level 0) - Assessment complete -- Yes- Hold (Level 0) - Assessment complete Yes- Hold (Level 0) - Assessment complete   What is the highest level of mobility based on the mobility assessment? -- -- Level 2 (Chairfast) - Balance while sitting on edge of bed and cannot stand -- --    Row Name 03/25/24 1400 03/25/24 1100 03/25/24 0800 03/24/24 2000     Does the patient have exclusion criteria? -- -- Yes - Bedfast (Level 1) - Select exclusion criteria in next row Yes - Bedfast (Level 1) - Select exclusion criteria in next row    Mobility Assessment Exclusion Criteria -- -- -- No exclusion criteria present, perform mobility assessment    What is the highest level of mobility based on the mobility assessment? Level 2 (Chairfast) - Balance while sitting on edge of bed and cannot stand Level 2 (Chairfast) - Balance while sitting on edge of bed and cannot stand Level 1 (Bedfast) - Unable to balance while sitting on edge of bed Level 3 (Stands with assistance) - Balance while standing  and cannot march in place    Is the above level different from baseline mobility prior to current illness? -- -- Yes - Recommend PT order Yes - Recommend PT order       Barriers to discharge:  Disposition Plan: TBD Status is: Inpatient  Objective: Blood pressure (P) 119/74, pulse (P) 96,  temperature (!) (P) 97.5 F (36.4 C), temperature source (P) Oral, resp. rate (P) 18, height 5' 10 (1.778 m), weight 81.9 kg, SpO2 (P) 100%.  Examination:  Physical Exam Constitutional:      Comments: More awake and alert this morning.  Conversational and easily following commands.  HENT:     Head: Normocephalic and atraumatic.     Mouth/Throat:     Mouth: Mucous membranes are moist.  Eyes:     Extraocular Movements: Extraocular movements intact.  Cardiovascular:     Rate and Rhythm: Normal rate and regular rhythm.  Pulmonary:     Effort: Pulmonary effort is normal. No respiratory distress.     Breath sounds: Normal breath sounds. No wheezing.  Abdominal:     General: Bowel sounds are normal. There is no distension.     Palpations: Abdomen is soft.     Tenderness: There is no abdominal tenderness.  Musculoskeletal:        General: Normal range of motion.     Cervical back:  Normal range of motion and neck supple.  Skin:    General: Skin is warm and dry.  Neurological:     Comments: Moving all 4 extremities and following commands      Consultants:  Hematology/oncology Neurosurgery   Procedures:    Data Reviewed: Results for orders placed or performed during the hospital encounter of 03/19/24 (from the past 24 hours)  Glucose, capillary     Status: Abnormal   Collection Time: 03/26/24  4:33 PM  Result Value Ref Range   Glucose-Capillary 169 (H) 70 - 99 mg/dL  Glucose, capillary     Status: Abnormal   Collection Time: 03/26/24  8:20 PM  Result Value Ref Range   Glucose-Capillary 131 (H) 70 - 99 mg/dL   Comment 1 Notify RN   Glucose, capillary     Status: Abnormal   Collection Time: 03/27/24 12:42 AM  Result Value Ref Range   Glucose-Capillary 188 (H) 70 - 99 mg/dL  Magnesium      Status: Abnormal   Collection Time: 03/27/24  2:00 AM  Result Value Ref Range   Magnesium  1.6 (L) 1.7 - 2.4 mg/dL  Phosphorus     Status: None   Collection Time: 03/27/24  2:00 AM   Result Value Ref Range   Phosphorus 4.1 2.5 - 4.6 mg/dL  Basic metabolic panel with GFR     Status: Abnormal   Collection Time: 03/27/24  2:00 AM  Result Value Ref Range   Sodium 134 (L) 135 - 145 mmol/L   Potassium 3.2 (L) 3.5 - 5.1 mmol/L   Chloride 99 98 - 111 mmol/L   CO2 22 22 - 32 mmol/L   Glucose, Bld 201 (H) 70 - 99 mg/dL   BUN 34 (H) 8 - 23 mg/dL   Creatinine, Ser 8.39 (H) 0.61 - 1.24 mg/dL   Calcium  7.3 (L) 8.9 - 10.3 mg/dL   GFR, Estimated 44 (L) >60 mL/min   Anion gap 13 5 - 15  CBC with Differential/Platelet     Status: Abnormal   Collection Time: 03/27/24  2:00 AM  Result Value Ref Range   WBC 21.4 (H) 4.0 - 10.5 K/uL   RBC 2.79 (L) 4.22 - 5.81 MIL/uL   Hemoglobin 8.1 (L) 13.0 - 17.0 g/dL   HCT 75.2 (L) 60.9 - 47.9 %   MCV 88.5 80.0 - 100.0 fL   MCH 29.0 26.0 - 34.0 pg   MCHC 32.8 30.0 - 36.0 g/dL   RDW 84.0 (H) 88.4 - 84.4 %   Platelets 21 (LL) 150 - 400 K/uL   nRBC 0.0 0.0 - 0.2 %   Neutrophils Relative % 39 %   Neutro Abs 8.2 (H) 1.7 - 7.7 K/uL   Lymphocytes Relative 8 %   Lymphs Abs 1.8 0.7 - 4.0 K/uL   Monocytes Relative 38 %   Monocytes Absolute 8.1 (H) 0.1 - 1.0 K/uL   Eosinophils Relative 0 %   Eosinophils Absolute 0.0 0.0 - 0.5 K/uL   Basophils Relative 0 %   Basophils Absolute 0.1 0.0 - 0.1 K/uL   WBC Morphology See Note    RBC Morphology MORPHOLOGY UNREMARKABLE    Smear Review See Note    Immature Granulocytes 15 %   Abs Immature Granulocytes 3.25 (H) 0.00 - 0.07 K/uL  Brain natriuretic peptide     Status: Abnormal   Collection Time: 03/27/24  2:00 AM  Result Value Ref Range   B Natriuretic Peptide 490.9 (H) 0.0 - 100.0 pg/mL  Hepatic function  panel     Status: Abnormal   Collection Time: 03/27/24  2:00 AM  Result Value Ref Range   Total Protein 4.3 (L) 6.5 - 8.1 g/dL   Albumin 1.8 (L) 3.5 - 5.0 g/dL   AST 12 (L) 15 - 41 U/L   ALT 10 0 - 44 U/L   Alkaline Phosphatase 76 38 - 126 U/L   Total Bilirubin 1.4 (H) 0.0 - 1.2 mg/dL    Bilirubin, Direct 0.3 (H) 0.0 - 0.2 mg/dL   Indirect Bilirubin 1.1 (H) 0.3 - 0.9 mg/dL  Lactic acid, plasma     Status: None   Collection Time: 03/27/24  2:10 AM  Result Value Ref Range   Lactic Acid, Venous 1.2 0.5 - 1.9 mmol/L  Prepare platelet pheresis     Status: None (Preliminary result)   Collection Time: 03/27/24  2:43 AM  Result Value Ref Range   Unit Number [id number]    Blood Component Type PLTP1 PSORALEN TREATED    Unit division 00    Status of Unit ISSUED    Transfusion Status OK TO TRANSFUSE    Unit Number [id number]    Blood Component Type PLTP3 PSORALEN TREATED    Unit division 00    Status of Unit ISSUED    Transfusion Status      OK TO TRANSFUSE Performed at [facility], [address] [place], [place] [address]   Glucose, capillary     Status: Abnormal   Collection Time: 03/27/24  4:16 AM  Result Value Ref Range   Glucose-Capillary 205 (H) 70 - 99 mg/dL  Urinalysis, Complete w Microscopic -     Status: Abnormal   Collection Time: 03/27/24  4:40 AM  Result Value Ref Range   Color, Urine YELLOW YELLOW   APPearance HAZY (A) CLEAR   Specific Gravity, Urine 1.015 1.005 - 1.030   pH 5.5 5.0 - 8.0   Glucose, UA >=500 (A) NEGATIVE mg/dL   Hgb urine dipstick NEGATIVE NEGATIVE   Bilirubin Urine NEGATIVE NEGATIVE   Ketones, ur 15 (A) NEGATIVE mg/dL   Protein, ur NEGATIVE NEGATIVE mg/dL   Nitrite NEGATIVE NEGATIVE   Leukocytes,Ua NEGATIVE NEGATIVE   Squamous Epithelial / HPF 0-5 0 - 5 /HPF   WBC, UA 0-5 0 - 5 WBC/hpf   RBC / HPF 0-5 0 - 5 RBC/hpf   Bacteria, UA MANY (A) NONE SEEN   Hyaline Casts, UA PRESENT    Granular Casts, UA PRESENT    Urine-Other POST TRANSFUSION SPECIMEN   Type and screen [facility]     Status: None   Collection Time: 03/27/24  5:45 AM  Result Value Ref Range   ABO/RH(D) A POS    Antibody Screen NEG    Sample Expiration      03/30/2024,2359 Performed at [facility], [address]  [place], [place] [address]   Transfusion reaction     Status: None   Collection Time: 03/27/24  5:45 AM  Result Value Ref Range   Post RXN DAT IgG NEG    DAT C3 NEG    Path interp tx rxn      null Performed at [facility], [address] [place], [place] [address]   Culture, blood (Routine X 2) w Reflex to ID Panel     Status: None (Preliminary result)   Collection Time: 03/27/24  6:03 AM   Specimen: BLOOD LEFT HAND  Result Value Ref Range   Specimen  Description BLOOD LEFT HAND    Special Requests      BOTTLES DRAWN AEROBIC ONLY Blood Culture results may not be optimal due to an inadequate volume of blood received in culture bottles   Culture      NO GROWTH <12 HOURS Performed at [facility], [address] [place], [place] [address]    Report Status PENDING   Culture, blood (Routine X 2) w Reflex to ID Panel     Status: None (Preliminary result)   Collection Time: 03/27/24  6:03 AM   Specimen: BLOOD RIGHT HAND  Result Value Ref Range   Specimen Description BLOOD RIGHT HAND    Special Requests      BOTTLES DRAWN AEROBIC AND ANAEROBIC Blood Culture results may not be optimal due to an inadequate volume of blood received in culture bottles   Culture      NO GROWTH <12 HOURS Performed at [facility], [address] [place], [place] [address]    Report Status PENDING   Glucose, capillary     Status: Abnormal   Collection Time: 03/27/24  8:22 AM  Result Value Ref Range   Glucose-Capillary 182 (H) 70 - 99 mg/dL   Comment 1 Notify RN    Comment 2 Document in Chart   Prepare platelet pheresis     Status: None (Preliminary result)   Collection Time: 03/27/24 10:47 AM  Result Value Ref Range   Unit Number [id number]    Blood Component Type PSORALEN TREATED    Unit division 00    Status of Unit ISSUED    Transfusion Status      OK TO TRANSFUSE Performed at [facility], [address], [place], [place] [address]   Glucose, capillary     Status:  Abnormal   Collection Time: 03/27/24 12:41 PM  Result Value Ref Range   Glucose-Capillary 283 (H) 70 - 99 mg/dL   Comment 1 Notify RN    Comment 2 Document in Chart     I have reviewed pertinent nursing notes, vitals, labs, and images as necessary. I have ordered labwork to follow up on as indicated.  I have reviewed the last notes from staff over past 24 hours. I have discussed patient's care plan and test results with nursing staff, CM/SW, and other staff as appropriate.  Time spent: Greater than 50% of the 55 minute visit was spent in counseling/coordination of care for the patient as laid out in the A&P.   LOS: 8 days   Alm Apo, MD [facility] 03/27/2024, 2:08 PM

## 2024-03-27 NOTE — Significant Event (Signed)
 Earlier in shift having Afib with RVR, received metoprolol  5 mg IV x 3. Has reasonable rate control ~ 110s although was better controlled until yesterday evening. In addition to IV metoprolol  I added 12.5 mg PO x 1 and increased PO dosing to 37.5 BID. Will need to be cautious and monitor closely as he has had bradycardia in the past.  On labs, PLT of 21. Ordered for 2 U of PLT transfusion. After 1st bag was hung at 330 patient had a fever to 101F although checked axillary, on repeat check was oral which also subsequently rose to 99.3. Considering his high risk history will treat as true fever, possible transfusion reaction. Suspect febrile nonhemolytic reaction considering PLT product. Advised RN to contact blood bank for transfusion reaction workup.   And otherwise with his worsening rate control, rising WBC will treat as possible sepsis. However considering his WBC was predominately blasts and now being reported as atypical monocyte which I suspect is probably erroneous and still blast predominant, likely his AML driving elevated WBC count.   Plan:   Afib RVR  -- Gave additional Metoprolol  12.5 mg x 1  -- Increased Metoprolol  to 37.5 mg BID   Fever  -- Transfusion reaction workup  -- Treat with tylenol , recommend Tylenol  pretreatment for subsequent transfusions  -- Check Blood cultures, UA, CXR  -- In abundance of caution start on Vancomycin and Cefepime, pending Bcx result.   Dorn Dawson, MD  [facility]

## 2024-03-28 DIAGNOSIS — L89159 Pressure ulcer of sacral region, unspecified stage: Secondary | ICD-10-CM | POA: Diagnosis present

## 2024-03-28 DIAGNOSIS — D696 Thrombocytopenia, unspecified: Secondary | ICD-10-CM | POA: Diagnosis not present

## 2024-03-28 DIAGNOSIS — S065XAA Traumatic subdural hemorrhage with loss of consciousness status unknown, initial encounter: Secondary | ICD-10-CM | POA: Diagnosis not present

## 2024-03-28 DIAGNOSIS — C9200 Acute myeloblastic leukemia, not having achieved remission: Secondary | ICD-10-CM | POA: Diagnosis not present

## 2024-03-28 LAB — CBC WITH DIFFERENTIAL/PLATELET
Abs Immature Granulocytes: 5.51 K/uL — ABNORMAL HIGH (ref 0.00–0.07)
Basophils Absolute: 0.1 K/uL (ref 0.0–0.1)
Basophils Relative: 0 %
Eosinophils Absolute: 0 K/uL (ref 0.0–0.5)
Eosinophils Relative: 0 %
HCT: 23.1 % — ABNORMAL LOW (ref 39.0–52.0)
Hemoglobin: 7.6 g/dL — ABNORMAL LOW (ref 13.0–17.0)
Immature Granulocytes: 22 %
Lymphocytes Relative: 6 %
Lymphs Abs: 1.6 K/uL (ref 0.7–4.0)
MCH: 29.1 pg (ref 26.0–34.0)
MCHC: 32.9 g/dL (ref 30.0–36.0)
MCV: 88.5 fL (ref 80.0–100.0)
Monocytes Absolute: 8.2 K/uL — ABNORMAL HIGH (ref 0.1–1.0)
Monocytes Relative: 33 %
Neutro Abs: 9.2 K/uL — ABNORMAL HIGH (ref 1.7–7.7)
Neutrophils Relative %: 39 %
Platelets: 25 K/uL — CL (ref 150–400)
RBC: 2.61 MIL/uL — ABNORMAL LOW (ref 4.22–5.81)
RDW: 16.1 % — ABNORMAL HIGH (ref 11.5–15.5)
WBC: 24.6 K/uL — ABNORMAL HIGH (ref 4.0–10.5)
nRBC: 0 % (ref 0.0–0.2)

## 2024-03-28 LAB — BASIC METABOLIC PANEL WITH GFR
Anion gap: 14 (ref 5–15)
BUN: 34 mg/dL — ABNORMAL HIGH (ref 8–23)
CO2: 23 mmol/L (ref 22–32)
Calcium: 7.4 mg/dL — ABNORMAL LOW (ref 8.9–10.3)
Chloride: 99 mmol/L (ref 98–111)
Creatinine, Ser: 1.35 mg/dL — ABNORMAL HIGH (ref 0.61–1.24)
GFR, Estimated: 54 mL/min — ABNORMAL LOW (ref >60)
Glucose, Bld: 245 mg/dL — ABNORMAL HIGH (ref 70–99)
Potassium: 3.9 mmol/L (ref 3.5–5.1)
Sodium: 136 mmol/L (ref 135–145)

## 2024-03-28 LAB — BPAM PLATELET PHERESIS
Blood Product Expiration Date: 202510132359
Blood Product Expiration Date: 202510142359
Blood Product Expiration Date: 202510142359
ISSUE DATE / TIME: 202510110318
ISSUE DATE / TIME: 202510111126
ISSUE DATE / TIME: 202510111208
Unit Type and Rh: 6200
Unit Type and Rh: 6200
Unit Type and Rh: 6200

## 2024-03-28 LAB — PREPARE PLATELET PHERESIS
Unit division: 0
Unit division: 0
Unit division: 0

## 2024-03-28 LAB — GLUCOSE, CAPILLARY
Glucose-Capillary: 184 mg/dL — ABNORMAL HIGH (ref 70–99)
Glucose-Capillary: 197 mg/dL — ABNORMAL HIGH (ref 70–99)
Glucose-Capillary: 202 mg/dL — ABNORMAL HIGH (ref 70–99)
Glucose-Capillary: 210 mg/dL — ABNORMAL HIGH (ref 70–99)
Glucose-Capillary: 216 mg/dL — ABNORMAL HIGH (ref 70–99)
Glucose-Capillary: 230 mg/dL — ABNORMAL HIGH (ref 70–99)

## 2024-03-28 LAB — MAGNESIUM: Magnesium: 1.9 mg/dL (ref 1.7–2.4)

## 2024-03-28 MED ORDER — LEVETIRACETAM 500 MG PO TABS: 500.0000 mg | ORAL_TABLET | Freq: Two times a day (BID) | ORAL | Status: DC

## 2024-03-28 MED ORDER — SODIUM CHLORIDE 0.9% IV SOLUTION: Freq: Once | INTRAVENOUS | Status: AC

## 2024-03-28 MED ORDER — METOPROLOL TARTRATE 37.5 MG PO TABS: 37.5000 mg | ORAL_TABLET | Freq: Two times a day (BID) | ORAL | Status: DC

## 2024-03-28 MED ORDER — FINASTERIDE 5 MG PO TABS: 5.0000 mg | ORAL_TABLET | Freq: Every day | ORAL | Status: DC

## 2024-03-28 MED ORDER — ONDANSETRON 8 MG PO TBDP: 8.0000 mg | ORAL_TABLET | Freq: Three times a day (TID) | ORAL | Status: DC | PRN

## 2024-03-28 NOTE — Discharge Summary (Signed)
 Physician Discharge Summary   Paul GORMAN Piety, MD FMW:[id number] DOB: 1946-03-01 DOA: 03/19/2024  PCP: [name], MD  Admit date: 03/19/2024 Discharge date: 03/28/2024  Admitted From: Home Disposition:  Transfer to [facility] Discharging physician: Alm Apo, MD  Discharge Condition: fair CODE STATUS: DNR/DNI Diet recommendation:  Diet Orders (From admission, onward)     Start     Ordered   03/28/24 0000  Diet general        03/28/24 1624   03/27/24 1515  Diet regular Fluid consistency: Thin  Diet effective now       Question:  Fluid consistency:  Answer:  Thin   03/27/24 1515            Hospital Course: 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   Workup in ED included CT PE protocol which was negative for PE.  Head CT was obtained due to complaint of headache and showed bilateral acute/subacute subdural hematomas.   The case was discussed with Dr. Zewdu who noted no need to transfer to [facility] (see 03/19/2024 note).  He was admitted to the ICU initially.  Neurosurgery was consulted who recommended supportive care.   Serial head CT's have shown stable subdural hematomas as of 10/5.  Case discussed next with Dr. Zeidner 10/5 who recommended transfer to [facility] for further continuity of care. Wife in agreement.   Main plan of care during hospitalization was transfusing platelets as needed for persistent severe thrombocytopenia especially in setting of underlying subdural hematomas.  Given some waxing and waning mentation, repeat CT head was performed on 10/11.  This showed some progression of underlying subdural hematomas however he had no change in neurologic function nor deficits.  Neurosurgery reevaluated after scans and again recommended no further intervention nor repeating imaging unless neurologic deficits.    Assessment & Plan:  Acute/Subacute  Bilateral Subdural Hematomas - CT with mixed density bilateral convexity extra axial hemorrhages with bilateral sulcal effacement and no significant midline shift on presentation 10/3 - CT 10/4 unchanged bilateral subdurals  - CT, 10/5 with R>L unchanged bilateral subdural hematoms - Neurosurgery recommending continuing supportive care, outpatient monitoring of SDH - Keppra 500 mg BID for seizure prophylaxis  - Transfuse platelets for goal >50,000  - CTH repeated 10/11 for follow up.  Showing some progression of SDH (R increased from 13 mm to 19 mm and left increased from 7 mm to 13 mm, now with 7 mm left shift too) - still giving PLT in efforts to keep >50 k; reevaluated by neurosurgery on 10/11.  Recommended no further intervention or imaging unless neurodeficits   AML  Severe Thrombocytopenia  Anemia - Follows with [facility] outpatienr, Dr. Zeidner  - Discussed with Dr. Onesimo here who recommended transfuse to platelet goal >50,000 in setting of above - Discussed with Dr. Carolan, who recommended transfer to [facility] as well  - Fibrinogen  wnl  - Transfuse for Hb < 8 or PLTC < 50k in setting of SDH - Hydroxyurea  resumed 10/9 since WBC > 10k - study drug has been resumed (as of 10/7) per wife after hearing from [facility]  Acute Metabolic Encephalopathy Generalized Weakness - VBG without hypercarbia - Ammonia wnl  - Seems to be improving; multifactorial with SDH, AML, deconditioning, ~FTT -Some waxing and waning of mentation usually from exhaustion and/or many visitors    Fever Leukocytosis - no neutropenia;  possibly central fever vs from AML vs infection - Leukocytosis with bandemia largely attributed to underlying AML - 10/6 blood cultures negative to date - UA not c/w UTI - CXR without acute abnormality - recurrent fever 03/26/24 @101  F and started on abx overnight of 10/10 - So far no further fevers.  Continue vancomycin and cefepime empirically for now   HFmrEF PACs  PVCs  Atrial Flutter  with RVR - Mexiletine, metoprolol  - Echo 02/2024 with EF 50-55% - Not an anticoagulation candidate in setting of SDH - Rate controlled on metoprolol  currently   AKI on CKD IIIa Improving, 1.76 on presentation  Baseline appears to be 1.2-1.3 (as recently as 10/1) -Renal ultrasound negative for hydronephrosis; mostly consistent with chronic medical renal disease - at risk for dehydration with poor intake; continue IVF for now until eating better   Hypokalemia  Hypomagnesemia  Replete as needed   Orthostatic Hypotension Noted, continue to monitor -SBP has since improved and remaining stable -If necessary, can resume compression stockings, fluids, abdominal binder - Lasix  and spironolactone  on hold  T2DM -Used sliding scale insulin  with NovoLog  only - Tresiba on hold while oral intake low   BPH Flomax   Sacral pressure ulcer - Cleanse sacrum and B buttocks with Vashe wound cleanser do not rinse and allow to air dry.  Apply Xeroform gauze to wound bed daily and secure with silicone foam or ABD pad and tape   Principal Diagnosis: Subdural hematoma (HCC)  Discharge Diagnoses: Active Hospital Problems   Diagnosis Date Noted   Subdural hematoma (HCC) 03/19/2024    Priority: 1.   Thrombocytopenia 03/21/2024    Priority: 2.   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   Sacral pressure ulcer 03/28/2024   Anemia 03/22/2024   Essential hypertension 05/17/2013   Atrial fibrillation (HCC) 11/27/2012    Resolved Hospital Problems  No resolved problems to display.     Discharge Instructions     Diet general   Complete by: As directed    Discharge wound care:   Complete by: As directed    Cleanse sacrum and B buttocks with Vashe wound cleanser do not rinse and allow to air dry.  Apply Xeroform gauze to wound bed daily and secure with silicone foam or ABD pad and tape      Allergies as of 03/28/2024       Reactions   Lisinopril Other (See  Comments), Shortness Of Breath, Swelling, Anaphylaxis   Angioedema Angioedema    Angioedema        Medication List     PAUSE taking these medications    furosemide  40 MG tablet Wait to take this until your doctor or other care provider tells you to start again. Commonly known as: LASIX  Take 1 tablet (40 mg total) by mouth daily.       STOP taking these medications    metoprolol  succinate 25 MG 24 hr tablet Commonly known as: TOPROL -XL       TAKE these medications    acetaminophen  500 MG tablet Commonly known as: TYLENOL  Take 1,000 mg by mouth every 6 (six) hours as needed for mild pain, moderate pain or headache.   diphenoxylate -atropine  2.5-0.025 MG tablet Commonly known as: LOMOTIL  Take 2 tablets by mouth as needed for diarrhea or loose stools.   finasteride  5 MG tablet Commonly known as: PROSCAR  Take 1 tablet (5 mg total) by mouth daily.   hydroxyurea  500 MG capsule Commonly known as: HYDREA  Take 2,000  mg by mouth 2 (two) times daily.   Investigational - Study Medication Take 300 mg by mouth 2 (two) times daily. Study name: Dulib [id number] 100mg  tablet Additional study details: Acute myeloid leukemia not having achieved remission  Take 3 tablets (300mg ) by mouth two time a day for 28 days, under fasted or fed conditions, with up to or 8oz of water .   Jardiance  25 MG Tabs tablet Generic drug: empagliflozin  TAKE 1 TABLET BY MOUTH EVERY DAY   LORazepam  1 MG tablet Commonly known as: ATIVAN  Take 1 mg by mouth at bedtime.   magnesium  oxide 400 (240 Mg) MG tablet Commonly known as: MAG-OX Take 1 tablet by mouth daily.   metFORMIN  500 MG tablet Commonly known as: GLUCOPHAGE  Take 1,000 mg by mouth 2 (two) times daily with a meal.   Metoprolol  Tartrate 37.5 MG Tabs Take 1 tablet (37.5 mg total) by mouth 2 (two) times daily.   mexiletine 200 MG capsule Commonly known as: MEXITIL  Take 1 capsule (200 mg total) by mouth 2 (two) times daily.    ondansetron  8 MG disintegrating tablet Commonly known as: ZOFRAN -ODT Take 1 tablet (8 mg total) by mouth every 8 (eight) hours as needed for nausea or vomiting. What changed:  when to take this reasons to take this   polyethylene glycol 17 g packet Commonly known as: MIRALAX  / GLYCOLAX  Take 17 g by mouth daily as needed.   rosuvastatin  20 MG tablet Commonly known as: CRESTOR  Take 20 mg by mouth at bedtime.   spironolactone  25 MG tablet Commonly known as: ALDACTONE  Take 1 tablet (25 mg total) by mouth daily.   tamsulosin  0.4 MG Caps capsule Commonly known as: FLOMAX  Take 0.8 mg by mouth at bedtime.   terbinafine  250 MG tablet Commonly known as: LAMISIL  Take 250 mg by mouth daily.   Tresiba FlexTouch 100 UNIT/ML FlexTouch Pen Generic drug: insulin  degludec Inject 10 Units into the skin daily.   valACYclovir  500 MG tablet Commonly known as: VALTREX  Take 500 mg by mouth daily.   voriconazole  200 MG tablet Commonly known as: VFEND  Take 200 mg by mouth 2 (two) times daily.               Discharge Care Instructions  (From admission, onward)           Start     Ordered   03/28/24 0000  Discharge wound care:       Comments: Cleanse sacrum and B buttocks with Vashe wound cleanser do not rinse and allow to air dry.  Apply Xeroform gauze to wound bed daily and secure with silicone foam or ABD pad and tape   03/28/24 1624            Allergies  Allergen Reactions   Lisinopril Other (See Comments), Shortness Of Breath, Swelling and Anaphylaxis    Angioedema  Angioedema    Angioedema    Discharge Exam: BP 138/80 (BP Location: Left Arm)   Pulse (!) 122 Comment: RN notified  Temp (!) 97.4 F (36.3 C) (Oral)   Resp 19   Ht 5' 10 (1.778 m)   Wt 81.9 kg   SpO2 99%   BMI 25.91 kg/m  Physical Exam Constitutional:      Comments: Now lethargic and difficult to awaken  HENT:     Head: Normocephalic and atraumatic.     Mouth/Throat:     Mouth:  Mucous membranes are moist.  Eyes:     Extraocular Movements: Extraocular movements intact.  Cardiovascular:     Rate and Rhythm: Normal rate and regular rhythm.  Pulmonary:     Effort: Pulmonary effort is normal. No respiratory distress.     Breath sounds: Normal breath sounds. No wheezing.  Abdominal:     General: Bowel sounds are normal. There is no distension.     Palpations: Abdomen is soft.     Tenderness: There is no abdominal tenderness.  Musculoskeletal:        General: Normal range of motion.     Cervical back: Normal range of motion and neck supple.  Skin:    General: Skin is warm and dry.  Neurological:     Comments: Moving all 4 extremities but unable to awaken enough to follow commands     The results of significant diagnostics from this hospitalization (including imaging, microbiology, ancillary and laboratory) are listed below for reference.   Microbiology: Recent Results (from the past 240 hours)  MRSA Next Gen by PCR, Nasal     Status: None   Collection Time: 03/19/24  7:31 PM   Specimen: Nasal Mucosa; Nasal Swab  Result Value Ref Range Status   MRSA by PCR Next Gen NOT DETECTED NOT DETECTED Final    Comment: (NOTE) The GeneXpert MRSA Assay (FDA approved for NASAL specimens only), is one component of a comprehensive MRSA colonization surveillance program. It is not intended to diagnose MRSA infection nor to guide or monitor treatment for MRSA infections. Test performance is not FDA approved in patients less than 11 years old. Performed at [facility], [address] [place], [place] [address]   Culture, blood (Routine X 2) w Reflex to ID Panel     Status: None   Collection Time: 03/22/24 11:57 AM   Specimen: BLOOD LEFT HAND  Result Value Ref Range Status   Specimen Description BLOOD LEFT HAND  Final   Special Requests   Final    BOTTLES DRAWN AEROBIC AND ANAEROBIC Blood Culture adequate volume   Culture   Final    NO GROWTH 5 DAYS Performed at  [facility], [address] [place], [place] [address]    Report Status 03/27/2024 FINAL  Final  Culture, blood (Routine X 2) w Reflex to ID Panel     Status: None   Collection Time: 03/22/24 12:10 PM   Specimen: BLOOD LEFT HAND  Result Value Ref Range Status   Specimen Description BLOOD LEFT HAND  Final   Special Requests   Final    BOTTLES DRAWN AEROBIC AND ANAEROBIC Blood Culture adequate volume   Culture   Final    NO GROWTH 5 DAYS Performed at [facility], [address], [place], [place] [address]    Report Status 03/27/2024 FINAL  Final  Culture, blood (Routine X 2) w Reflex to ID Panel     Status: None (Preliminary result)   Collection Time: 03/27/24  6:03 AM   Specimen: BLOOD LEFT HAND  Result Value Ref Range Status   Specimen Description BLOOD LEFT HAND  Final   Special Requests   Final    BOTTLES DRAWN AEROBIC ONLY Blood Culture results may not be optimal due to an inadequate volume of blood received in culture bottles   Culture   Final    NO GROWTH 1 DAY Performed at [facility], [address] [place], [place] [address]    Report Status PENDING  Incomplete  Culture, blood (Routine X 2) w Reflex to ID Panel  Status: None (Preliminary result)   Collection Time: 03/27/24  6:03 AM   Specimen: BLOOD RIGHT HAND  Result Value Ref Range Status   Specimen Description BLOOD RIGHT HAND  Final   Special Requests   Final    BOTTLES DRAWN AEROBIC AND ANAEROBIC Blood Culture results may not be optimal due to an inadequate volume of blood received in culture bottles   Culture   Final    NO GROWTH 1 DAY Performed at [facility], [address], [place], [place] [address]    Report Status PENDING  Incomplete     Labs: BNP (last 3 results) Recent Labs    03/06/24 2250 03/19/24 1135 03/27/24 0200  BNP 251.7* 259.0* 490.9*   Basic Metabolic Panel: Recent Labs  Lab 03/22/24 0515 03/23/24 0552 03/24/24 0355 03/25/24 0535 03/26/24 0700  03/27/24 0200 03/28/24 0650  NA 135 133* 134* 135 136 134* 136  K 3.1* 4.2 3.6 3.5 3.5 3.2* 3.9  CL 100 99 100 99 98 99 99  CO2 25 23 24 24  21* 22 23  GLUCOSE 72 193* 176* 132* 164* 201* 245*  BUN 33* 32* 30* 25* 28* 34* 34*  CREATININE 1.71* 1.72* 1.61* 1.52* 1.66* 1.60* 1.35*  CALCIUM  7.8* 7.9* 7.8* 7.9* 7.5* 7.3* 7.4*  MG 2.2 2.0 1.7 1.7 1.7 1.6* 1.9  PHOS 5.4* 4.5  --   --   --  4.1  --    Liver Function Tests: Recent Labs  Lab 03/22/24 0515 03/27/24 0200  AST 11* 12*  ALT 10 10  ALKPHOS 70 76  BILITOT 0.9 1.4*  PROT 4.6* 4.3*  ALBUMIN 2.0* 1.8*   No results for input(s): LIPASE, AMYLASE in the last 168 hours. Recent Labs  Lab 03/22/24 1545  AMMONIA 15   CBC: Recent Labs  Lab 03/25/24 1512 03/26/24 0700 03/27/24 0200 03/27/24 1835 03/28/24 0650  WBC 19.7* 22.9* 21.4* 26.5* 24.6*  NEUTROABS 5.1 7.2 8.2* 9.9* 9.2*  HGB 7.9* 8.3* 8.1* 7.7* 7.6*  HCT 23.8* 25.3* 24.7* 23.1* 23.1*  MCV 88.8 89.4 88.5 89.2 88.5  PLT 47* 18* 21* 47* 25*   Cardiac Enzymes: No results for input(s): CKTOTAL, CKMB, CKMBINDEX, TROPONINI in the last 168 hours. BNP: Invalid input(s): POCBNP CBG: Recent Labs  Lab 03/28/24 0028 03/28/24 0344 03/28/24 0754 03/28/24 1206 03/28/24 1554  GLUCAP 202* 210* 216* 197* 184*   D-Dimer No results for input(s): DDIMER in the last 72 hours. Hgb A1c No results for input(s): HGBA1C in the last 72 hours. Lipid Profile No results for input(s): CHOL, HDL, LDLCALC, TRIG, CHOLHDL, LDLDIRECT in the last 72 hours. Thyroid  function studies No results for input(s): TSH, T4TOTAL, T3FREE, THYROIDAB in the last 72 hours.  Invalid input(s): FREET3 Anemia work up No results for input(s): VITAMINB12, FOLATE, FERRITIN, TIBC, IRON, RETICCTPCT in the last 72 hours. Urinalysis    Component Value Date/Time   COLORURINE YELLOW 03/27/2024 0440   APPEARANCEUR HAZY (A) 03/27/2024 0440   LABSPEC 1.015  03/27/2024 0440   PHURINE 5.5 03/27/2024 0440   GLUCOSEU >=500 (A) 03/27/2024 0440   HGBUR NEGATIVE 03/27/2024 0440   BILIRUBINUR NEGATIVE 03/27/2024 0440   BILIRUBINUR negative 06/20/2015 1656   BILIRUBINUR small 10/20/2012 1350   KETONESUR 15 (A) 03/27/2024 0440   PROTEINUR NEGATIVE 03/27/2024 0440   UROBILINOGEN 0.2 06/20/2015 1656   NITRITE NEGATIVE 03/27/2024 0440   LEUKOCYTESUR NEGATIVE 03/27/2024 0440   Sepsis Labs Recent Labs  Lab 03/26/24 0700 03/27/24 0200 03/27/24 1835 03/28/24 0650  WBC 22.9* 21.4* 26.5* 24.6*   Microbiology Recent Results (from the past 240 hours)  MRSA Next Gen by PCR, Nasal     Status: None   Collection Time: 03/19/24  7:31 PM   Specimen: Nasal Mucosa; Nasal Swab  Result Value Ref Range Status   MRSA by PCR Next Gen NOT DETECTED NOT DETECTED Final    Comment: (NOTE) The GeneXpert MRSA Assay (FDA approved for NASAL specimens only), is one component of a comprehensive MRSA colonization surveillance program. It is not intended to diagnose MRSA infection nor to guide or monitor treatment for MRSA infections. Test performance is not FDA approved in patients less than 58 years old. Performed at [facility], [address] [place], [place] [address]   Culture, blood (Routine X 2) w Reflex to ID Panel     Status: None   Collection Time: 03/22/24 11:57 AM   Specimen: BLOOD LEFT HAND  Result Value Ref Range Status   Specimen Description BLOOD LEFT HAND  Final   Special Requests   Final    BOTTLES DRAWN AEROBIC AND ANAEROBIC Blood Culture adequate volume   Culture   Final    NO GROWTH 5 DAYS Performed at [facility], [address] [place], [place] [address]    Report Status 03/27/2024 FINAL  Final  Culture, blood (Routine X 2) w Reflex to ID Panel     Status: None   Collection Time: 03/22/24 12:10 PM   Specimen: BLOOD LEFT HAND  Result Value Ref Range Status   Specimen Description BLOOD LEFT HAND  Final   Special Requests    Final    BOTTLES DRAWN AEROBIC AND ANAEROBIC Blood Culture adequate volume   Culture   Final    NO GROWTH 5 DAYS Performed at [facility], [address] [place], [place] [address]    Report Status 03/27/2024 FINAL  Final  Culture, blood (Routine X 2) w Reflex to ID Panel     Status: None (Preliminary result)   Collection Time: 03/27/24  6:03 AM   Specimen: BLOOD LEFT HAND  Result Value Ref Range Status   Specimen Description BLOOD LEFT HAND  Final   Special Requests   Final    BOTTLES DRAWN AEROBIC ONLY Blood Culture results may not be optimal due to an inadequate volume of blood received in culture bottles   Culture   Final    NO GROWTH 1 DAY Performed at [facility], [address], [place], [place] [address]    Report Status PENDING  Incomplete  Culture, blood (Routine X 2) w Reflex to ID Panel     Status: None (Preliminary result)   Collection Time: 03/27/24  6:03 AM   Specimen: BLOOD RIGHT HAND  Result Value Ref Range Status   Specimen Description BLOOD RIGHT HAND  Final   Special Requests   Final    BOTTLES DRAWN AEROBIC AND ANAEROBIC Blood Culture results may not be optimal due to an inadequate volume of blood received in culture bottles   Culture   Final    NO GROWTH 1 DAY Performed at [facility], [address] [place], [place] [address]    Report Status PENDING  Incomplete    Procedures/Studies: CT HEAD WO CONTRAST ( ) Addendum Date: 03/27/2024 ** ADDENDUM #1 ** ADDENDUM: Study discussed by telephone with Doctor Marissah Vandemark at 10:34 hours on 03/27/2024 ---------------------------------------------------- Electronically signed by: Helayne Hurst MD 03/27/2024 10:49 AM EDT RP Workstation:  [id number]   Result Date: 03/27/2024 ** ORIGINAL REPORT ** EXAM: CT HEAD WITHOUT CONTRAST 03/27/2024 09:33:26 AM TECHNIQUE: CT of the head was performed without the administration of intravenous contrast. Automated exposure control, iterative reconstruction, and/or  weight based adjustment of the mA/kV was utilized to reduce the radiation dose to as low as reasonably achievable. COMPARISON: Head CT 03/21/2024 and earlier. CLINICAL HISTORY: 78 year old male, follow up SDH. FINDINGS: BRAIN AND VENTRICLES: Progressed right sided mixed density subdural hematoma maximal thickness now approximately 19 mm versus 13 mm at the same level earlier this month. Contralateral smaller mixed density left subdural hematoma also is larger along the left posterior convexity, up to 13 mm thickness there now versus 7 mm at the same level earlier this month. Hypodense predominant blood products now although scattered intermediate and hyperdense blood also within both collections. Increased mass effect on both cerebral hemispheres. Increased leftward midline shift which is now 7 mm. Mass effect on the lateral ventricles. No ventriculomegaly. Stable basilar cistern patency. Mega cisterna magna. Stable gray white differentiation, no cerebral edema identified. No suspicious intracranial vascular hyperdensity. No evidence of acute infarct. ORBITS: No acute abnormality. SINUSES: Acute and chronic paranasal sinus inflammatory changes are stable, including right maxillary fluid level and periosteal thickening. SOFT TISSUES AND SKULL: No acute soft tissue abnormality. No skull fracture. IMPRESSION: 1. Progressed bilateral mixed density subdural hematoma: Right side 19 mm and Left side 13 mm. 2. Increased mass effect on both cerebral hemispheres and increased leftward midline shift of 7 mm. Electronically signed by: Helayne Hurst MD 03/27/2024 09:43 AM EDT RP Workstation: [id number]   DG CHEST PORT 1 VIEW Result Date: 03/27/2024 EXAM: 1 VIEW(S) XRAY OF THE CHEST 03/27/2024 06:15:00 AM COMPARISON: 03/22/2024 CLINICAL HISTORY: fever FINDINGS: LINES, TUBES AND DEVICES: Right chest port with tip at superior cavoatrial junction. LUNGS AND PLEURA: Low lung volumes. Increased bibasilar patchy opacities. Possible  bilateral pleural effusions. No pneumothorax. HEART AND MEDIASTINUM: No acute abnormality of the cardiac and mediastinal silhouettes. BONES AND SOFT TISSUES: No acute osseous abnormality. IMPRESSION: 1. Increased bibasilar patchy opacities and possible bilateral pleural effusions. 2. Low lung volumes. Electronically signed by: Waddell Calk MD 03/27/2024 07:26 AM EDT RP Workstation: [id number]   US  RENAL Result Date: 03/23/2024 CLINICAL DATA:  Acute kidney injury EXAM: RENAL / URINARY TRACT ULTRASOUND COMPLETE COMPARISON:  Ultrasound abdomen 12/08/2012 FINDINGS: Right Kidney: Renal measurements: 11.1 x 5.5 x 5.1 cm = volume: 162 mL. Increased parenchymal echotexture suggesting chronic medical renal disease. Circumscribed simple appearing cyst on the lower pole measuring 3.5 cm diameter. No hydronephrosis. Left Kidney: Renal measurements: 11.7 x 5.6 x 4.9 cm = volume: 170 mL. Increased parenchymal echotexture suggesting chronic medical renal disease. Circumscribed simple appearing cyst on the upper pole measuring 4 cm maximal diameter. No hydronephrosis. Bladder: Appears normal for degree of bladder distention. Other: None. IMPRESSION: 1. Increased renal parenchymal echotexture bilaterally suggesting chronic medical renal disease. No hydronephrosis. 2. Bilateral benign-appearing renal cysts. No imaging follow-up is indicated. Electronically Signed   By: Elsie Gravely M.D.   On: 03/23/2024 21:39   DG CHEST PORT 1 VIEW Result Date: 03/22/2024 CLINICAL DATA:  Weakness and shortness of breath. EXAM: PORTABLE CHEST 1 VIEW COMPARISON:  03/19/2024 FINDINGS: Stable right jugular porta catheter with its tip in the inferior aspect of the superior vena cava near the superior cavoatrial junction. Normal sized heart. Clear lungs with normal vascularity. Moderate right AC joint degenerative changes. IMPRESSION: No acute abnormality. Electronically Signed   By: Elspeth  Robynn M.D.   On: 03/22/2024 12:21   CT HEAD WO  CONTRAST ( ) Result Date: 03/21/2024 EXAM: CT HEAD WITHOUT CONTRAST 03/21/2024 07:27:55 AM TECHNIQUE: CT of the head was performed without the administration of intravenous contrast. Automated exposure control, iterative reconstruction, and/or weight based adjustment of the mA/kV was utilized to reduce the radiation dose to as low as reasonably achievable. COMPARISON: CT of the head dated 03/20/2024. CLINICAL HISTORY: Subdural hematoma. FINDINGS: BRAIN AND VENTRICLES: There are mixed attenuation subdural hematomas again demonstrated bilaterally, again more pronounced on the right, which are unchanged in size and appearance in the interim. No evidence of acute infarct. No hydrocephalus. No mass effect or midline shift. There is age-related cerebral atrophy. There is small-to-moderate periventricular white matter disease. There are calcifications within the carotid siphons. ORBITS: No acute abnormality. SINUSES: There is mild-to-moderate mucosal disease within the paranasal sinuses. SOFT TISSUES AND SKULL: No acute soft tissue abnormality. No skull fracture. IMPRESSION: 1. Mixed attenuation bilateral subdural hematomas, right greater than left, unchanged in size and appearance since 03/20/24. 2. Age-related cerebral atrophy and small-to-moderate periventricular white matter disease. Electronically signed by: Evalene Coho MD 03/21/2024 07:40 AM EDT RP Workstation: [id number]   CT HEAD WO CONTRAST ( ) Result Date: 03/20/2024 EXAM: CT HEAD WITHOUT CONTRAST 03/20/2024 02:05:54 AM TECHNIQUE: CT of the head was performed without the administration of intravenous contrast. Automated exposure control, iterative reconstruction, and/or weight based adjustment of the mA/kV was utilized to reduce the radiation dose to as low as reasonably achievable. COMPARISON: 03/19/2024 CLINICAL HISTORY: Subdural hematoma. Subdural hematoma. FINDINGS: BRAIN AND VENTRICLES: Unchanged appearance of mixed density bilateral convexity  subdural hematomas, 13 mm thick on the right and 8 mm on the left. No midline shift. No new site of hemorrhage. Chronic ischemic white matter changes. No evidence of acute infarct. No hydrocephalus. No mass effect. ORBITS: No acute abnormality. SINUSES: Partial opacification of the right maxillary and bilateral ethmoid sinuses. SOFT TISSUES AND SKULL: No acute soft tissue abnormality. No skull fracture. IMPRESSION: 1. Unchanged mixed-density bilateral convexity subdural hematomas, 13 mm right and 8 mm left, without midline shift. 2. No new intracranial hemorrhage. Electronically signed by: Franky Stanford MD 03/20/2024 02:21 AM EDT RP Workstation: [id number]   CT Head Wo Contrast Result Date: 03/19/2024 EXAM: CT HEAD WITHOUT CONTRAST 03/19/2024 05:43:14 PM TECHNIQUE: CT of the head was performed without the administration of intravenous contrast. Automated exposure control, iterative reconstruction, and/or weight based adjustment of the mA/kV was utilized to reduce the radiation dose to as low as reasonably achievable. COMPARISON: None available. CLINICAL HISTORY: Headache, increasing frequency or severity. FINDINGS: BRAIN AND VENTRICLES: A mixed density extra-axial hemorrhage over the right convexity measures up to 12 mm. A less extensive extraaxial collection over the left convexity measures 7 mm. The effacement of the sulci is present bilaterally. No significant midline shift is present. Periventricular and subcortical white matter hypoattenuation is moderately advanced for age. This most likely reflects the sequelae of chronic microvascular ischemia. Atherosclerotic calcifications are present in the cavernous carotid arteries bilaterally and at the dural margin of both vertebral arteries. No hyperdense vessel is present. No evidence of acute infarct. No hydrocephalus. No mass effect. ORBITS: No acute abnormality. SINUSES: Chronic right maxillary sinus disease is present with wall thickening. A fluid level is  present. Mild mucosal thickening is present in the sphenoid sinuses bilaterally. SOFT TISSUES AND SKULL: No acute soft tissue abnormality. No skull fracture. IMPRESSION: 1. Mixed-density bilateral convexity extra-axial hemorrhages (right up to 12 mm, left up  to 7 mm) with bilateral sulcal effacement and no significant midline shift. The collections are consistent with bilateral acute/subacute subdural hematomas. 2. Moderately advanced chronic microvascular ischemic changes. 3. Intracranial atherosclerosis. Critical value findings were called to Dr. Lenor at 5:57 pm. Electronically signed by: Lonni Necessary MD 03/19/2024 06:01 PM EDT RP Workstation: [id number]   CT Angio Chest PE W and/or Wo Contrast Result Date: 03/19/2024 CLINICAL DATA:  Shortness of breath for 2 weeks. EXAM: CT ANGIOGRAPHY CHEST WITH CONTRAST TECHNIQUE: Multidetector CT imaging of the chest was performed using the standard protocol during bolus administration of intravenous contrast. Multiplanar CT image reconstructions and MIPs were obtained to evaluate the vascular anatomy. RADIATION DOSE REDUCTION: This exam was performed according to the departmental dose-optimization program which includes automated exposure control, adjustment of the mA and/or kV according to patient size and/or use of iterative reconstruction technique. CONTRAST:  75mL OMNIPAQUE  IOHEXOL  350 MG/ML SOLN COMPARISON:  June 02, 2014. FINDINGS: Cardiovascular: Satisfactory opacification of the pulmonary arteries to the segmental level. No evidence of pulmonary embolism. Normal heart size. No pericardial effusion. Coronary artery calcifications are noted. 4 cm ascending thoracic aortic aneurysm is noted. Aortic atherosclerosis is noted. Mediastinum/Nodes: No enlarged mediastinal, hilar, or axillary lymph nodes. Thyroid  gland, trachea, and esophagus demonstrate no significant findings. Lungs/Pleura: Minimal bilateral pleural effusions are noted. No pneumothorax is  noted. Minimal bibasilar subsegmental atelectasis is noted. Upper Abdomen: No acute abnormality. Musculoskeletal: No chest wall abnormality. No acute or significant osseous findings. Review of the MIP images confirms the above findings. IMPRESSION: 1. No definite evidence of pulmonary embolus. 2. Coronary artery calcifications are noted suggesting coronary artery disease. 3. Minimal bilateral pleural effusions are noted with minimal bibasilar subsegmental atelectasis. 4. 4 cm ascending thoracic aortic aneurysm. Recommend annual imaging followup by CTA or MRA. This recommendation follows 2010 ACCF/AHA/AATS/ACR/ASA/SCA/SCAI/SIR/STS/SVM Guidelines for the Diagnosis and Management of Patients with Thoracic Aortic Disease. Circulation. 2010; 121: Z733-z630. Aortic aneurysm NOS (ICD10-I71.9). Aortic Atherosclerosis (ICD10-I70.0). Electronically Signed   By: Lynwood Landy Raddle M.D.   On: 03/19/2024 16:03   DG Chest Port 1 View Result Date: 03/19/2024 CLINICAL DATA:  Shortness of breath and weakness. EXAM: PORTABLE CHEST 1 VIEW COMPARISON:  03/06/2024 FINDINGS: Right IJ Port-A-Cath with tip over the SVC. Lungs are adequately inflated and otherwise clear. Cardiomediastinal silhouette and remainder of the exam is unchanged. IMPRESSION: No acute cardiopulmonary disease. Electronically Signed   By: Toribio Agreste M.D.   On: 03/19/2024 11:53   DG Chest Portable 1 View Result Date: 03/06/2024 CLINICAL DATA:  Shortness of breath EXAM: PORTABLE CHEST 1 VIEW COMPARISON:  05/05/2019 FINDINGS: Right Port-A-Cath in place with the tip in the right atrium. Heart and mediastinal contours are within normal limits. No focal opacities or effusions. No acute bony abnormality. IMPRESSION: No active disease. Electronically Signed   By: Franky Crease M.D.   On: 03/06/2024 23:17     Time coordinating discharge: Over 30 minutes    Alm Apo, MD  [facility] 03/28/2024, 4:24 PM

## 2024-03-28 NOTE — Progress Notes (Signed)
 Report called to [facility].  All questions answered.  Medical transport here to pick up patient for [facility].

## 2024-03-28 NOTE — Progress Notes (Signed)
 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  Workup in ED included CT PE protocol which was negative for PE.  Head CT was obtained due to complaint of headache and showed bilateral acute/subacute subdural hematomas.   The case was discussed with Dr. Zewdu who noted no need to transfer to [facility] (see 03/19/2024 note).  He was admitted to the ICU.  Neurosurgery was consulted who recommended supportive care.  Serial head CT's have shown stable subdural hematomas.  Case then discussed with Dr. Zeidner 10/5 who recommends transfer to [facility] for further care.     No beds available at [facility].  Currently transfusing as needed for thrombocytopenia.  Working on transfer to [facility] when able.  [facility] at capacity 10/5 >> present. Will continue to follow with [facility]. He's accepted contingent on bed availability and room on heme/onc service.    Assessment & Plan:  Acute/Subacute Bilateral Subdural Hematomas - CT with mixed density bilateral convexity extra axial hemorrhages with bilateral sulcal effacement and no significant midline shift on presentation 10/3 - CT 10/4 unchanged bilateral subdurals  - CT, 10/5 with R>L unchanged bilateral subdural hematoms - Neurosurgery recommending continuing supportive care, outpatient monitoring of SDH - Keppra 500 mg BID for seizure prophylaxis  - Transfuse platelets for goal >50,000  - CTH repeated 10/11 for follow up.  Showing some progression of SDH (R increased from 13 mm to 19 mm and left increased from 7 mm to 13 mm, now with 7 mm left shift too) - still giving PLT in efforts to keep >50 k; reevaluated by neurosurgery on 10/11.  Recommended no further intervention or imaging unless neurodeficits   AML   Severe Thrombocytopenia  Anemia - Follows with [facility] outpatienr, Dr. Carolan  - Discussed with Dr. Onesimo here who recommended transfuse to platelet goal >50,000 in setting of above - Discussed with Dr. Carolan, who recommended transfer to [facility] as well  - Fibrinogen  wnl  - Transfuse for Hb < 8 or PLTC < 50k in setting of SDH - Hydroxyurea  resumed 10/9 since WBC > 10k - study drug has been resumed (as of 10/7) per wife after hearing from [facility]  Acute Metabolic Encephalopathy Generalized Weakness - VBG without hypercarbia - Ammonia wnl  - Seems to be improving; multifactorial with SDH, AML, deconditioning, ~FTT   Fever Leukocytosis - no neutropenia; possibly central fever vs from AML vs infection - Leukocytosis with bandemia largely attributed to underlying AML - 10/6 blood cultures negative to date - UA not c/w UTI - CXR without acute abnormality - recurrent fever 03/26/24 @101  F and started on abx overnight - So far no further fevers.  Continue vancomycin and cefepime empirically for now   HFmrEF PACs  PVCs  Atrial Flutter with RVR - Mexiletine, metoprolol  - Echo 02/2024 with EF 50-55% - Not an anticoagulation candidate in setting of SDH - Rate controlled on metoprolol  currently   AKI on CKD IIIa Improving, 1.76 on presentation  Baseline appears to be 1.2-1.3 (as recently as 10/1) -Renal ultrasound negative for hydronephrosis; mostly consistent with chronic medical renal disease - at risk for dehydration with poor intake; continue IVF for now until eating better   Hypokalemia  Hypomagnesemia  Replete as needed   Orthostatic Hypotension Noted, continue to monitor -SBP has since improved and remaining stable -If necessary, can resume compression stockings, fluids, abdominal binder  T2DM SSI   BPH Flomax   Interval History:   No events overnight.  Wife present bedside this morning.  He had a lot of visitors throughout the day yesterday and this morning seems to be  overly exhausted from multiple visits yesterday.  No other acute concerns this morning.  Resting comfortably otherwise. Platelets low as usual and receiving more transfusions today.  Old records reviewed in assessment of this patient  Antimicrobials:   DVT prophylaxis:  SCDs Start: 03/19/24 1858   Code Status:   Code Status: Limited: Do not attempt resuscitation (DNR) -DNR-LIMITED -Do Not Intubate/DNI   Mobility Assessment (Last 72 Hours)     Mobility Assessment     Row Name 03/28/24 1037 03/27/24 2000 03/27/24 0758 03/26/24 2041 03/26/24 1137   Does the patient have exclusion criteria? Yes- Hold (Level 0) - Assessment complete Yes- Hold (Level 0) - Assessment complete Yes- Hold (Level 0) - Assessment complete Yes- Hold (Level 0) - Assessment complete --   Mobility Assessment Exclusion Criteria -- No exclusion criteria present, perform mobility assessment -- -- --   What is the highest level of mobility based on the mobility assessment? -- Level 2 (Chairfast) - Balance while sitting on edge of bed and cannot stand -- -- Level 2 (Chairfast) - Balance while sitting on edge of bed and cannot stand   Is the above level different from baseline mobility prior to current illness? -- Yes - Recommend PT order -- -- --    Row Name 03/26/24 1129 03/25/24 1942         Does the patient have exclusion criteria? Yes- Hold (Level 0) - Assessment complete Yes- Hold (Level 0) - Assessment complete         Barriers to discharge:  Disposition Plan: TBD Status is: Inpatient  Objective: Blood pressure 138/80, pulse (!) 122, temperature (!) 97.4 F (36.3 C), temperature source Oral, resp. rate 19, height 5' 10 (1.778 m), weight 81.9 kg, SpO2 99%.  Examination:  Physical Exam Constitutional:      Comments: Now lethargic and difficult to awaken  HENT:     Head: Normocephalic and atraumatic.     Mouth/Throat:     Mouth: Mucous membranes are moist.  Eyes:     Extraocular Movements: Extraocular  movements intact.  Cardiovascular:     Rate and Rhythm: Normal rate and regular rhythm.  Pulmonary:     Effort: Pulmonary effort is normal. No respiratory distress.     Breath sounds: Normal breath sounds. No wheezing.  Abdominal:     General: Bowel sounds are normal. There is no distension.     Palpations: Abdomen is soft.     Tenderness: There is no abdominal tenderness.  Musculoskeletal:        General: Normal range of motion.     Cervical back: Normal range of motion and neck supple.  Skin:    General: Skin is warm and dry.  Neurological:     Comments: Moving all 4 extremities but unable to awaken enough to follow commands      Consultants:  Hematology/oncology Neurosurgery   Procedures:    Data Reviewed: Results for orders placed or performed during the hospital encounter of 03/19/24 (from the past 24 hours)  Glucose, capillary     Status: Abnormal   Collection Time: 03/27/24  5:23 PM  Result Value Ref Range   Glucose-Capillary 216 (H) 70 - 99 mg/dL   Comment 1 Notify RN    Comment 2 Document in Chart   CBC with Differential/Platelet     Status: Abnormal   Collection Time: 03/27/24  6:35 PM  Result Value Ref Range   WBC 26.5 (H) 4.0 - 10.5 K/uL   RBC 2.59 (L) 4.22 - 5.81 MIL/uL   Hemoglobin 7.7 (L) 13.0 - 17.0 g/dL   HCT 76.8 (L) 60.9 - 47.9 %   MCV 89.2 80.0 - 100.0 fL   MCH 29.7 26.0 - 34.0 pg   MCHC 33.3 30.0 - 36.0 g/dL   RDW 83.9 (H) 88.4 - 84.4 %   Platelets 47 (L) 150 - 400 K/uL   nRBC 0.0 0.0 - 0.2 %   Neutrophils Relative % 37 %   Neutro Abs 9.9 (H) 1.7 - 7.7 K/uL   Lymphocytes Relative 8 %   Lymphs Abs 2.1 0.7 - 4.0 K/uL   Monocytes Relative 33 %   Monocytes Absolute 8.7 (H) 0.1 - 1.0 K/uL   Eosinophils Relative 0 %   Eosinophils Absolute 0.0 0.0 - 0.5 K/uL   Basophils Relative 0 %   Basophils Absolute 0.1 0.0 - 0.1 K/uL   WBC Morphology See Note    RBC Morphology MORPHOLOGY UNREMARKABLE    Smear Review See Note    Immature Granulocytes 22 %    Abs Immature Granulocytes 5.72 (H) 0.00 - 0.07 K/uL  Glucose, capillary     Status: Abnormal   Collection Time: 03/27/24  8:36 PM  Result Value Ref Range   Glucose-Capillary 203 (H) 70 - 99 mg/dL  Glucose, capillary     Status: Abnormal   Collection Time: 03/28/24 12:28 AM  Result Value Ref Range   Glucose-Capillary 202 (H) 70 - 99 mg/dL  Glucose, capillary     Status: Abnormal   Collection Time: 03/28/24  3:44 AM  Result Value Ref Range   Glucose-Capillary 210 (H) 70 - 99 mg/dL  Basic metabolic panel with GFR     Status: Abnormal   Collection Time: 03/28/24  6:50 AM  Result Value Ref Range   Sodium 136 135 - 145 mmol/L   Potassium 3.9 3.5 - 5.1 mmol/L   Chloride 99 98 - 111 mmol/L   CO2 23 22 - 32 mmol/L   Glucose, Bld 245 (H) 70 - 99 mg/dL   BUN 34 (H) 8 - 23 mg/dL   Creatinine, Ser 8.64 (H) 0.61 - 1.24 mg/dL   Calcium  7.4 (L) 8.9 - 10.3 mg/dL   GFR, Estimated 54 (L) >60 mL/min   Anion gap 14 5 - 15  CBC with Differential/Platelet     Status: Abnormal   Collection Time: 03/28/24  6:50 AM  Result Value Ref Range   WBC 24.6 (H) 4.0 - 10.5 K/uL   RBC 2.61 (L) 4.22 - 5.81 MIL/uL   Hemoglobin 7.6 (L) 13.0 - 17.0 g/dL   HCT 76.8 (L) 60.9 - 47.9 %   MCV 88.5 80.0 - 100.0 fL   MCH 29.1 26.0 - 34.0 pg   MCHC 32.9 30.0 - 36.0 g/dL   RDW 83.8 (H) 88.4 - 84.4 %   Platelets 25 (LL) 150 - 400 K/uL   nRBC 0.0 0.0 - 0.2 %   Neutrophils Relative % 39 %   Neutro Abs 9.2 (H) 1.7 - 7.7 K/uL   Lymphocytes Relative 6 %   Lymphs Abs 1.6 0.7 - 4.0  K/uL   Monocytes Relative 33 %   Monocytes Absolute 8.2 (H) 0.1 - 1.0 K/uL   Eosinophils Relative 0 %   Eosinophils Absolute 0.0 0.0 - 0.5 K/uL   Basophils Relative 0 %   Basophils Absolute 0.1 0.0 - 0.1 K/uL   WBC Morphology See Note    RBC Morphology MORPHOLOGY UNREMARKABLE    Smear Review See Note    Immature Granulocytes 22 %   Abs Immature Granulocytes 5.51 (H) 0.00 - 0.07 K/uL  Magnesium      Status: None   Collection Time: 03/28/24   6:50 AM  Result Value Ref Range   Magnesium  1.9 1.7 - 2.4 mg/dL  Glucose, capillary     Status: Abnormal   Collection Time: 03/28/24  7:54 AM  Result Value Ref Range   Glucose-Capillary 216 (H) 70 - 99 mg/dL  Prepare platelet pheresis     Status: None (Preliminary result)   Collection Time: 03/28/24  8:33 AM  Result Value Ref Range   Unit Number [id number]    Blood Component Type PLTP2 PSORALEN TREATED    Unit division 00    Status of Unit ISSUED    Transfusion Status OK TO TRANSFUSE    Unit Number [id number]    Blood Component Type PLTP1 PSORALEN TREATED    Unit division 00    Status of Unit ISSUED    Transfusion Status      OK TO TRANSFUSE Performed at [facility], [address] [place], [place] [address]   Glucose, capillary     Status: Abnormal   Collection Time: 03/28/24 12:06 PM  Result Value Ref Range   Glucose-Capillary 197 (H) 70 - 99 mg/dL    I have reviewed pertinent nursing notes, vitals, labs, and images as necessary. I have ordered labwork to follow up on as indicated.  I have reviewed the last notes from staff over past 24 hours. I have discussed patient's care plan and test results with nursing staff, CM/SW, and other staff as appropriate.  Time spent: Greater than 50% of the 55 minute visit was spent in counseling/coordination of care for the patient as laid out in the A&P.   LOS: 9 days   Alm Apo, MD [facility] 03/28/2024, 3:54 PM

## 2024-03-28 NOTE — Plan of Care (Signed)

## 2024-03-28 NOTE — Progress Notes (Signed)
 PHARMACIST - PHYSICIAN COMMUNICATION  DR:   Patsy  CONCERNING: IV to Oral Route Change Policy  RECOMMENDATION: This patient is receiving Keppra 500 mg by the intravenous route.  Based on criteria approved by the Pharmacy and Therapeutics Committee, the intravenous medication(s) is being converted to the equivalent oral dose form(s).   DESCRIPTION: These criteria include: The patient is eating (either orally or via tube) and/or has been taking other orally administered medications for at least 24 hours The patient has no evidence of active gastrointestinal bleeding or impaired GI absorption (gastrectomy, short bowel, patient on TNA or NPO).  If you have questions about this conversion, please contact the Pharmacy Department  []   [phone] )  [facility] []   [phone] )  [facility] [x]   [phone] )  [facility] []   [phone] )  [facility] []   [phone] )  [facility]   R. Samual Satterfield, PharmD PGY-1 Acute Care Pharmacy Resident [facility] Please refer to [facility] for [facility] Pharmacy numbers 03/28/2024 9:28 AM

## 2024-03-28 NOTE — Plan of Care (Signed)
 Problem: Education: Goal: Knowledge of General Education information will improve Description: Including pain rating scale, medication(s)/side effects and non-pharmacologic comfort measures 03/28/2024 1727 by Jenel Bobetta SAILOR, RN Outcome: Adequate for Discharge 03/28/2024 0827 by Jenel Bobetta SAILOR, RN Outcome: Progressing   Problem: Health Behavior/Discharge Planning: Goal: Ability to manage health-related needs will improve 03/28/2024 1727 by Jenel Bobetta SAILOR, RN Outcome: Adequate for Discharge 03/28/2024 0827 by Jenel Bobetta SAILOR, RN Outcome: Progressing   Problem: Clinical Measurements: Goal: Ability to maintain clinical measurements within normal limits will improve 03/28/2024 1727 by Jenel Bobetta SAILOR, RN Outcome: Adequate for Discharge 03/28/2024 0827 by Jenel Bobetta SAILOR, RN Outcome: Progressing Goal: Will remain free from infection 03/28/2024 1727 by Jenel Bobetta SAILOR, RN Outcome: Adequate for Discharge 03/28/2024 0827 by Jenel Bobetta SAILOR, RN Outcome: Progressing Goal: Diagnostic test results will improve 03/28/2024 1727 by Jenel Bobetta SAILOR, RN Outcome: Adequate for Discharge 03/28/2024 0827 by Jenel Bobetta SAILOR, RN Outcome: Progressing Goal: Respiratory complications will improve 03/28/2024 1727 by Jenel Bobetta SAILOR, RN Outcome: Adequate for Discharge 03/28/2024 0827 by Jenel Bobetta SAILOR, RN Outcome: Progressing Goal: Cardiovascular complication will be avoided 03/28/2024 1727 by Jenel Bobetta SAILOR, RN Outcome: Adequate for Discharge 03/28/2024 0827 by Jenel Bobetta SAILOR, RN Outcome: Progressing   Problem: Activity: Goal: Risk for activity intolerance will decrease 03/28/2024 1727 by Jenel Bobetta SAILOR, RN Outcome: Adequate for Discharge 03/28/2024 0827 by Jenel Bobetta SAILOR, RN Outcome: Progressing   Problem: Nutrition: Goal: Adequate nutrition will be maintained 03/28/2024 1727 by Jenel Bobetta SAILOR, RN Outcome: Adequate for Discharge 03/28/2024 0827 by Jenel Bobetta SAILOR, RN Outcome:  Progressing   Problem: Coping: Goal: Level of anxiety will decrease 03/28/2024 1727 by Jenel Bobetta SAILOR, RN Outcome: Adequate for Discharge 03/28/2024 0827 by Jenel Bobetta SAILOR, RN Outcome: Progressing   Problem: Elimination: Goal: Will not experience complications related to bowel motility 03/28/2024 1727 by Jenel Bobetta SAILOR, RN Outcome: Adequate for Discharge 03/28/2024 0827 by Jenel Bobetta SAILOR, RN Outcome: Progressing Goal: Will not experience complications related to urinary retention 03/28/2024 1727 by Jenel Bobetta SAILOR, RN Outcome: Adequate for Discharge 03/28/2024 0827 by Jenel Bobetta SAILOR, RN Outcome: Progressing   Problem: Pain Managment: Goal: General experience of comfort will improve and/or be controlled 03/28/2024 1727 by Jenel Bobetta SAILOR, RN Outcome: Adequate for Discharge 03/28/2024 0827 by Jenel Bobetta SAILOR, RN Outcome: Progressing   Problem: Safety: Goal: Ability to remain free from injury will improve 03/28/2024 1727 by Jenel Bobetta SAILOR, RN Outcome: Adequate for Discharge 03/28/2024 0827 by Jenel Bobetta SAILOR, RN Outcome: Progressing   Problem: Skin Integrity: Goal: Risk for impaired skin integrity will decrease 03/28/2024 1727 by Jenel Bobetta SAILOR, RN Outcome: Adequate for Discharge 03/28/2024 0827 by Jenel Bobetta SAILOR, RN Outcome: Progressing   Problem: Education: Goal: Ability to describe self-care measures that may prevent or decrease complications (Diabetes Survival Skills Education) will improve 03/28/2024 1727 by Jenel Bobetta SAILOR, RN Outcome: Adequate for Discharge 03/28/2024 0827 by Jenel Bobetta SAILOR, RN Outcome: Progressing Goal: Individualized Educational Video(s) 03/28/2024 1727 by Jenel Bobetta SAILOR, RN Outcome: Adequate for Discharge 03/28/2024 0827 by Jenel Bobetta SAILOR, RN Outcome: Progressing   Problem: Coping: Goal: Ability to adjust to condition or change in health will improve 03/28/2024 1727 by Jenel Bobetta SAILOR, RN Outcome: Adequate for  Discharge 03/28/2024 0827 by Jenel Bobetta SAILOR, RN Outcome: Progressing   Problem: Fluid Volume: Goal: Ability to maintain a balanced intake and output will improve 03/28/2024 1727 by Jenel Bobetta SAILOR, RN Outcome: Adequate for Discharge 03/28/2024  0827 by Jenel Bobetta SAILOR, RN Outcome: Progressing   Problem: Health Behavior/Discharge Planning: Goal: Ability to identify and utilize available resources and services will improve 03/28/2024 1727 by Jenel Bobetta SAILOR, RN Outcome: Adequate for Discharge 03/28/2024 0827 by Jenel Bobetta SAILOR, RN Outcome: Progressing Goal: Ability to manage health-related needs will improve 03/28/2024 1727 by Jenel Bobetta SAILOR, RN Outcome: Adequate for Discharge 03/28/2024 0827 by Jenel Bobetta SAILOR, RN Outcome: Progressing   Problem: Metabolic: Goal: Ability to maintain appropriate glucose levels will improve 03/28/2024 1727 by Jenel Bobetta SAILOR, RN Outcome: Adequate for Discharge 03/28/2024 0827 by Jenel Bobetta SAILOR, RN Outcome: Progressing   Problem: Nutritional: Goal: Maintenance of adequate nutrition will improve 03/28/2024 1727 by Jenel Bobetta SAILOR, RN Outcome: Adequate for Discharge 03/28/2024 0827 by Jenel Bobetta SAILOR, RN Outcome: Progressing Goal: Progress toward achieving an optimal weight will improve 03/28/2024 1727 by Jenel Bobetta SAILOR, RN Outcome: Adequate for Discharge 03/28/2024 0827 by Jenel Bobetta SAILOR, RN Outcome: Progressing   Problem: Skin Integrity: Goal: Risk for impaired skin integrity will decrease 03/28/2024 1727 by Jenel Bobetta SAILOR, RN Outcome: Adequate for Discharge 03/28/2024 0827 by Jenel Bobetta SAILOR, RN Outcome: Progressing   Problem: Tissue Perfusion: Goal: Adequacy of tissue perfusion will improve 03/28/2024 1727 by Jenel Bobetta SAILOR, RN Outcome: Adequate for Discharge 03/28/2024 0827 by Jenel Bobetta SAILOR, RN Outcome: Progressing

## 2024-03-29 ENCOUNTER — Ambulatory Visit: Attending: Student | Admitting: Student

## 2024-03-29 LAB — BPAM PLATELET PHERESIS
Blood Product Expiration Date: 202510142359
Blood Product Expiration Date: 202510152359
ISSUE DATE / TIME: 202510120940
ISSUE DATE / TIME: 202510121008
Unit Type and Rh: 5100
Unit Type and Rh: 6200

## 2024-03-29 LAB — PREPARE PLATELET PHERESIS
Unit division: 0
Unit division: 0

## 2024-03-30 ENCOUNTER — Encounter: Payer: Self-pay | Admitting: Student

## 2024-03-30 LAB — TRANSFUSION REACTION
DAT C3: NEGATIVE
Post RXN DAT IgG: NEGATIVE

## 2024-04-01 LAB — CULTURE, BLOOD (ROUTINE X 2)
Culture: NO GROWTH
Culture: NO GROWTH

## 2024-04-17 DEATH — deceased

## 2024-05-15 ENCOUNTER — Encounter: Payer: Self-pay | Admitting: Internal Medicine
